# Patient Record
Sex: Female | Born: 1945 | Race: White | Hispanic: No | Marital: Married | Smoking: Never smoker
Health system: Southern US, Community
[De-identification: ages and names within clinical notes are randomized; demographics above are authoritative.]

## PROBLEM LIST (undated history)

## (undated) DIAGNOSIS — I480 Paroxysmal atrial fibrillation: Secondary | ICD-10-CM

## (undated) DIAGNOSIS — R319 Hematuria, unspecified: Secondary | ICD-10-CM

## (undated) DIAGNOSIS — I517 Cardiomegaly: Secondary | ICD-10-CM

## (undated) DIAGNOSIS — E039 Hypothyroidism, unspecified: Secondary | ICD-10-CM

## (undated) DIAGNOSIS — D696 Thrombocytopenia, unspecified: Secondary | ICD-10-CM

## (undated) DIAGNOSIS — Z973 Presence of spectacles and contact lenses: Secondary | ICD-10-CM

## (undated) DIAGNOSIS — N200 Calculus of kidney: Secondary | ICD-10-CM

## (undated) DIAGNOSIS — E559 Vitamin D deficiency, unspecified: Secondary | ICD-10-CM

## (undated) DIAGNOSIS — L900 Lichen sclerosus et atrophicus: Secondary | ICD-10-CM

## (undated) HISTORY — DX: Calculus of kidney: N20.0

## (undated) HISTORY — PX: CATARACT EXTRACTION: SUR2

## (undated) HISTORY — DX: Paroxysmal atrial fibrillation: I48.0

## (undated) HISTORY — DX: Vitamin D deficiency, unspecified: E55.9

## (undated) HISTORY — DX: Morbid (severe) obesity due to excess calories: E66.01

## (undated) HISTORY — PX: REPLACEMENT TOTAL KNEE: SUR1224

## (undated) HISTORY — PX: DILATION AND CURETTAGE OF UTERUS: SHX78

## (undated) HISTORY — DX: Hematuria, unspecified: R31.9

## (undated) HISTORY — DX: Cardiomegaly: I51.7

## (undated) HISTORY — DX: Thrombocytopenia, unspecified: D69.6

## (undated) HISTORY — DX: Hypothyroidism, unspecified: E03.9

## (undated) HISTORY — DX: Lichen sclerosus et atrophicus: L90.0

## (undated) HISTORY — PX: TONSILLECTOMY: SUR1361

---

## 1975-11-12 HISTORY — PX: TUBAL LIGATION: SHX77

## 1998-01-29 ENCOUNTER — Other Ambulatory Visit: Admission: RE | Admit: 1998-01-29 | Discharge: 1998-01-29 | Payer: Self-pay | Admitting: Obstetrics and Gynecology

## 1998-02-11 HISTORY — PX: HYSTEROSCOPY WITH D & C: SHX1775

## 1998-02-12 ENCOUNTER — Other Ambulatory Visit: Admission: RE | Admit: 1998-02-12 | Discharge: 1998-02-12 | Payer: Self-pay | Admitting: Obstetrics and Gynecology

## 1998-02-20 ENCOUNTER — Ambulatory Visit (HOSPITAL_COMMUNITY): Admission: RE | Admit: 1998-02-20 | Discharge: 1998-02-20 | Payer: Self-pay | Admitting: Obstetrics and Gynecology

## 1999-04-13 ENCOUNTER — Emergency Department (HOSPITAL_COMMUNITY): Admission: EM | Admit: 1999-04-13 | Discharge: 1999-04-13 | Payer: Self-pay

## 1999-12-06 ENCOUNTER — Other Ambulatory Visit: Admission: RE | Admit: 1999-12-06 | Discharge: 1999-12-06 | Payer: Self-pay | Admitting: Obstetrics and Gynecology

## 2000-03-11 ENCOUNTER — Other Ambulatory Visit: Admission: RE | Admit: 2000-03-11 | Discharge: 2000-03-11 | Payer: Self-pay | Admitting: Obstetrics and Gynecology

## 2000-06-15 ENCOUNTER — Other Ambulatory Visit: Admission: RE | Admit: 2000-06-15 | Discharge: 2000-06-15 | Payer: Self-pay | Admitting: Obstetrics and Gynecology

## 2000-10-07 ENCOUNTER — Ambulatory Visit (HOSPITAL_BASED_OUTPATIENT_CLINIC_OR_DEPARTMENT_OTHER): Admission: RE | Admit: 2000-10-07 | Discharge: 2000-10-07 | Payer: Self-pay | Admitting: Orthopedic Surgery

## 2000-12-14 ENCOUNTER — Other Ambulatory Visit: Admission: RE | Admit: 2000-12-14 | Discharge: 2000-12-14 | Payer: Self-pay | Admitting: Obstetrics and Gynecology

## 2001-03-16 ENCOUNTER — Other Ambulatory Visit: Admission: RE | Admit: 2001-03-16 | Discharge: 2001-03-16 | Payer: Self-pay | Admitting: Obstetrics and Gynecology

## 2001-09-10 ENCOUNTER — Other Ambulatory Visit: Admission: RE | Admit: 2001-09-10 | Discharge: 2001-09-10 | Payer: Self-pay | Admitting: Obstetrics and Gynecology

## 2001-12-31 ENCOUNTER — Other Ambulatory Visit: Admission: RE | Admit: 2001-12-31 | Discharge: 2001-12-31 | Payer: Self-pay | Admitting: Obstetrics and Gynecology

## 2003-04-10 ENCOUNTER — Other Ambulatory Visit: Admission: RE | Admit: 2003-04-10 | Discharge: 2003-04-10 | Payer: Self-pay | Admitting: Obstetrics and Gynecology

## 2003-04-14 HISTORY — PX: COLONOSCOPY: SHX174

## 2004-04-13 DIAGNOSIS — N200 Calculus of kidney: Secondary | ICD-10-CM

## 2004-04-13 HISTORY — DX: Calculus of kidney: N20.0

## 2004-04-19 ENCOUNTER — Emergency Department (HOSPITAL_COMMUNITY): Admission: EM | Admit: 2004-04-19 | Discharge: 2004-04-19 | Payer: Self-pay | Admitting: Emergency Medicine

## 2004-07-25 ENCOUNTER — Other Ambulatory Visit: Admission: RE | Admit: 2004-07-25 | Discharge: 2004-07-25 | Payer: Self-pay | Admitting: *Deleted

## 2004-11-14 ENCOUNTER — Ambulatory Visit (HOSPITAL_COMMUNITY): Admission: RE | Admit: 2004-11-14 | Discharge: 2004-11-14 | Payer: Self-pay | Admitting: Urology

## 2004-11-18 ENCOUNTER — Ambulatory Visit (HOSPITAL_COMMUNITY): Admission: RE | Admit: 2004-11-18 | Discharge: 2004-11-18 | Payer: Self-pay | Admitting: Urology

## 2005-12-16 ENCOUNTER — Other Ambulatory Visit: Admission: RE | Admit: 2005-12-16 | Discharge: 2005-12-16 | Payer: Self-pay | Admitting: Obstetrics & Gynecology

## 2006-12-28 ENCOUNTER — Inpatient Hospital Stay (HOSPITAL_COMMUNITY): Admission: RE | Admit: 2006-12-28 | Discharge: 2006-12-31 | Payer: Self-pay | Admitting: Orthopedic Surgery

## 2007-07-06 ENCOUNTER — Other Ambulatory Visit: Admission: RE | Admit: 2007-07-06 | Discharge: 2007-07-06 | Payer: Self-pay | Admitting: Obstetrics and Gynecology

## 2007-08-09 ENCOUNTER — Inpatient Hospital Stay (HOSPITAL_COMMUNITY): Admission: RE | Admit: 2007-08-09 | Discharge: 2007-08-12 | Payer: Self-pay | Admitting: Orthopedic Surgery

## 2007-08-09 DIAGNOSIS — Z96652 Presence of left artificial knee joint: Secondary | ICD-10-CM | POA: Insufficient documentation

## 2008-07-14 HISTORY — PX: BREAST EXCISIONAL BIOPSY: SUR124

## 2008-10-04 ENCOUNTER — Other Ambulatory Visit: Admission: RE | Admit: 2008-10-04 | Discharge: 2008-10-04 | Payer: Self-pay | Admitting: Obstetrics and Gynecology

## 2010-05-03 ENCOUNTER — Ambulatory Visit (HOSPITAL_BASED_OUTPATIENT_CLINIC_OR_DEPARTMENT_OTHER): Admission: RE | Admit: 2010-05-03 | Discharge: 2010-05-03 | Payer: Self-pay | Admitting: Orthopedic Surgery

## 2010-06-13 DIAGNOSIS — E039 Hypothyroidism, unspecified: Secondary | ICD-10-CM

## 2010-06-13 DIAGNOSIS — E559 Vitamin D deficiency, unspecified: Secondary | ICD-10-CM

## 2010-06-13 HISTORY — DX: Vitamin D deficiency, unspecified: E55.9

## 2010-06-13 HISTORY — DX: Hypothyroidism, unspecified: E03.9

## 2010-06-25 LAB — HM PAP SMEAR: HM Pap smear: NEGATIVE

## 2010-11-26 NOTE — Op Note (Signed)
NAME:  Sally Wise, Sally Wise NO.:  [id number]   MEDICAL RECORD NO.:  [id number]          PATIENT TYPE:  INP   LOCATION:  X003                         FACILITY:  [facility]   PHYSICIAN:  Ollen Gross, M.D.    DATE OF BIRTH:  May 08, 1946   DATE OF PROCEDURE:  12/28/2006  DATE OF DISCHARGE:                               OPERATIVE REPORT   PREOPERATIVE DIAGNOSIS:  Osteoarthritis bilateral knees.   POSTOPERATIVE DIAGNOSIS:  Osteoarthritis bilateral knees.   PROCEDURE:  1. Right total knee arthroplasty.  2. Cortisone injection left knee.   SURGEON:  Ollen Gross, M.D.   ASSISTANT:  Avel Peace PA-C   ANESTHESIA:  Spinal.   ESTIMATED BLOOD LOSS:  Minimal.   DRAINS:  None.   TOURNIQUET TIME:  43 minutes at 300 mmHg.   COMPLICATIONS:  None.   CONDITION:  Stable to recovery.   BRIEF CLINICAL NOTE:  Sally Wise is a 65 year old female with severe  end-stage arthritis both knees, right more symptomatic than left.  She  has failed nonoperative management including multiple series of  injections and presents now for total knee arthroplasty of the right and  a cortisone injection on the left.   PROCEDURE IN DETAIL:  After successful initiation of spinal anesthetic,  a tourniquet is placed on her right thigh and right lower extremity  prepped and draped in usual sterile fashion.  Extremities wrapped in  Esmarch, knee flexed, tourniquet inflated 350 mmHg.  Midline incision  was made with 10 blade through subcutaneous tissue which was very thick  layer down to the level of her extensor mechanism.  Fresh blade is used  to make a medial parapatellar arthrotomy.  Soft tissue of the proximal  medial tibia subperiosteally elevated to the joint line with the knife  into the semimembranosus bursa with a Cobb elevator.  Soft tissue  laterally is elevated attention being paid to avoid patellar tendon on  tibial tubercle.  Patella subluxed laterally, knee flexed 90 degrees,  ACL  and PCL removed.  Drill was used to create a starting hole in the  distal femur and the canal was thoroughly irrigated.  5 degrees right  valgus alignment guide is placed and referencing off the posterior  condyles, rotations marked and the block pinned to remove 10 mL of the  distal femur.  Distal femoral resection was made with an oscillating  saw.  Sizing blocks placed a size 4 is most appropriate.  Rotation is  marked off the epicondylar axis and the size 4 cutting block placed.  The anterior-posterior and chamfer cuts were made.   Tibia subluxed forward and the menisci are removed.  Extramedullary  tibial alignment guide is placed referencing proximally at the medial  aspect of the tibial tubercle and distally along the second metatarsal  axis and tibial crest.  The resection is made so as to take 10 mm of the  non deficient lateral side.  Tibial resection is made with an  oscillating saw.  I did to go an additional 2 mm to get to the base of  tibial defect medially.  Size 4 was  the most appropriate tibial  component and the proximal tibia prepared the modular drill and keel  punch for size 4.  Femoral preparation is completed with the  intercondylar cut.   Size 4 mobile bearing tibial trial and size 4 posterior stabilized  femoral trial with a 12.5 mm posterior stabilized rotating platform  insert trial are placed.  Full extension achieved with excellent varus  valgus balance throughout full range of motion.  Patella was everted,  thickness measured 21 mm.  Freehand resection is taken to 13 mm, 38  templates placed, lug holes were drilled, trial patella was placed and  tracks normally.  Osteophytes removed off the posterior femur with the  trial in place.  All trials are removed and the neck cut and the cut  bone surfaces are prepared with pulsatile lavage.  The cement was mixed  and once ready for implantation the size 4 mobile bearing tibial tray,  size 4 posterior stabilized  femur and 38 patella are cemented into  place.  The patella was held with a clamp.  Trial 12.5 inserts placed,  knee held in full extension and all extruded cement removed.  Once  cement fully hardened then the wounds copiously irrigated with saline  solution and the FloSeal injected onto the posterior capsule.  The  permanent 12.5 mm posterior stabilized rotating platform insert is  placed into the tibial tray.  FloSeal injected into the medial lateral  gutters and suprapatellar area.  The tourniquet is then released with  total time of 43 minutes.  Moist sponge is held in the knee for about a  minute.  The knee was inspected and was minimal bleeding.  Any bleeding  identified is stopped with electrocautery.  We then thoroughly irrigated  with saline solution and extensor mechanism closed with interrupted #1  PDS.  Flexion against gravity to 125 degrees which point the calf and  posterior thigh and touching.  Subcu is then closed interrupted 2-0  Vicryl, subcuticular running 4-0 Monocryl.  Incisions cleaned and dried  and Steri-Strips and bulky sterile dressing applied.  She is then placed  in the knee immobilizer, awakened and transported to recovery in stable  condition.      Ollen Gross, M.D.  Electronically Signed     FA/MEDQ  D:  12/28/2006  T:  12/28/2006  Job:  [id number]

## 2010-11-26 NOTE — H&P (Signed)
NAME:  Sally, Wise NO.:  [id number]   MEDICAL RECORD NO.:  [id number]          PATIENT TYPE:  INP   LOCATION:  NA                           [facility]ACILITY:  [facility]   PHYSICIAN:  Ollen Gross, M.D.    DATE OF BIRTH:  1946/03/07   DATE OF ADMISSION:  12/28/2006  DATE OF DISCHARGE:                              HISTORY & PHYSICAL   DATE OF OFFICE VISIT HISTORY AND PHYSICAL:  December 24, 2006   CHIEF COMPLAINT:  Right greater than left knee pain.   HISTORY OF PRESENT ILLNESS:  The patient is a 65 year old female who has  been seen by Dr. Lequita Halt.  She is known to have end-stage  tricompartmental arthritis in both knees; the right knee is more  symptomatic and problematic than the left and more advanced on  radiograph.  She has been treated conservatively in the past for both  knees.  She has undergone injections including Synvisc.  It is felt at  this point and she best be served by undergoing knee replacement.  Risks  and benefits have been discussed.  She elects proceed with surgery with  the right knee first.   ALLERGIES:  NO KNOWN DRUG ALLERGIES.   CURRENT MEDICATIONS:  Celebrex.   PAST MEDICAL HISTORY:  History of renal calculi.   PAST SURGICAL HISTORY:  1. Tubal ligation.  2. Renal stent placement.  3. Left knee arthroscopy.   SOCIAL HISTORY:  Married, retired Runner, broadcasting/film/video, nonsmoker, no alcohol.  Two  children.   FAMILY HISTORY:  Mother with history of hypertension.  Daughter with a  history of stroke.   REVIEW OF SYSTEMS:  GENERAL:  No fevers, chills or night sweats.  NEURO:  No seizures, syncope or paralysis.  RESPIRATORY:  No shortness of  breath, productive cough or hemoptysis.  CARDIOVASCULAR:  No chest pain,  angina or orthopnea.  GI:  No nausea, vomiting, diarrhea or  constipation.  GU:  No dysuria, hematuria or discharge.  MUSCULOSKELETAL:  Right knee.   PHYSICAL EXAMINATION:  VITAL SIGNS:  Pulse 64, respirations 12, blood  pressure 122/78.  GENERAL:  This is a 65 year old white female, well-nourished, well-  developed, overweight, obese, in no acute distress.  She is alert,  oriented, cooperative, pleasant, excellent historian.  HEENT:  Normocephalic, atraumatic.  Pupils are round and reactive.  Oropharynx clear.  EOMs intact.  NECK:  Supple.  CHEST:  Clear.  HEART:  Regular rate and rhythm.  No murmur.  ABDOMEN:  Soft, nontender, protuberant abdomen with large pannus.  RECTAL, BREAST AND GENITALIA:  Not done and not pertinent to present  illness.  EXTREMITIES:  Right knee:  The right knee shows no effusion, marked  crepitus noted and no instability.  Left knee:  No effusion, marked  crepitus is noted and no instability.   IMPRESSION:  1. Osteoarthritis, right greater than left knee.  2. Obesity.  3. History of renal calculi.   PLAN:  The patient will be admitted to [facility] and will  undergo a right total knee replacement arthroplasty.  Surgery will be  performed by Dr. Ollen Gross.  Alexzandrew L. Perkins, P.A.C.      Ollen Gross, M.D.  Electronically Signed    ALP/MEDQ  D:  12/27/2006  T:  12/28/2006  Job:  [id number]   cc:   C. Duane Lope, M.D.  Fax: [phone]

## 2010-11-26 NOTE — H&P (Signed)
NAME:  Sally Wise, Sally Wise NO.:  [id number]   MEDICAL RECORD NO.:  [id number]          PATIENT TYPE:  INP   LOCATION:  NA                           FACILITY:  [facility]   PHYSICIAN:  Ollen Gross, M.D.    DATE OF BIRTH:  May 29, 1946   DATE OF ADMISSION:  08/09/2007  DATE OF DISCHARGE:                              HISTORY & PHYSICAL   DATE OF OFFICE VISIT HISTORY AND PHYSICAL:  July 20, 2007.   CHIEF COMPLAINT:  Left knee pain.   HISTORY OF PRESENT ILLNESS:  The patient is a 65 year old female who has  been seen by Dr. Homero Fellers Aluisio for ongoing left knee pain.  She is well  known having previously undergone a right total knee back in June 2008.  She is doing well with the right knee, continues to have problems with  the left knee, has known end-stage arthritis and now presents for a  total knee arthroplasty.   ALLERGIES:  No known drug allergies.   CURRENT MEDICATIONS:  Celebrex.   PRIMARY CARE PHYSICIAN:  Dr. Tenny Craw.   CARDIOLOGIST:  Dr. Meade Maw in the past and she is seen at [facility].   PAST MEDICAL HISTORY:  History of renal calculi, history of atrial  fibrillation back in 2000 requiring an emergency room visit but no  hospitalization. Also a history of transfusion with a previous right  total knee in June 2008.   PAST SURGICAL HISTORY:  Tubal ligation, renal stent placement, left knee  arthroscopy, right total knee arthroplasty in June 2008.   SOCIAL HISTORY:  Married, retired Runner, broadcasting/film/video.  Nonsmoker.  No alcohol. The  family will be assisting with care after surgery.   FAMILY HISTORY:  Father deceased, mother with history of hypertension,  age 63, has one sibling age 55.   REVIEW OF SYSTEMS:  GENERAL:  No fevers, chills, or night sweats.  NEUROLOGIC:  No seizures, syncope or paralysis.  RESPIRATORY:  No  shortness of breath, productive cough or hemoptysis.  CARDIOVASCULAR:  No chest pain, angina or orthopnea. GI: No nausea, vomiting,  diarrhea,  or constipation.  GU: No dysuria, hematuria or discharge.  MUSCULOSKELETAL:  Left knee pain.   PHYSICAL EXAMINATION:  VITAL SIGNS:  Pulse 60, respirations 14, blood  pressure 134/88.  GENERAL:  A 65 year old, white female, well-nourished, well-developed,  in no acute distress, alert, oriented and cooperative, overweight,  obese, good historian.  HEENT:  Normocephalic, atraumatic.  Pupils round and reactive.  Oropharynx clear.  EOMs intact.  NECK:  Supple.  CHEST:  Clear anterior and posterior chest walls.  No rhonchi, rales or  wheezing.  HEART:  Regular rate and rhythm.  No murmur, S1, S2 noted.  ABDOMEN:  Soft, nontender, round protuberant abdomen.  RECTAL/BREASTS/GENITALIA:  Not done not pertinent to present illness.  EXTREMITIES:  Left knee motor function is intact.  She has moderate  crepitus noted on passive range of motion.  No effusion.   IMPRESSION:  Osteoarthritis left knee.   PLAN:  The patient will be admitted to [facility] to undergo a left  total knee replacement arthroplasty.  Surgery will be performed by Dr.  Ollen Gross. She has been seen preoperatively by Dr. Tenny Craw and cleared  for up and coming surgery.      Alexzandrew L. Perkins, P.A.C.      Ollen Gross, M.D.  Electronically Signed    ALP/MEDQ  D:  08/08/2007  T:  08/09/2007  Job:  [id number]   cc:   C. Duane Lope, M.D.  Fax: [phone]

## 2010-11-26 NOTE — Op Note (Signed)
NAME:  Sally Wise, Sally Wise NO.:  [id number]   MEDICAL RECORD NO.:  [id number]          PATIENT TYPE:  INP   LOCATION:  X003                         FACILITY:  [facility]   PHYSICIAN:  Ollen Gross, M.D.    DATE OF BIRTH:  11/01/1945   DATE OF PROCEDURE:  12/28/2006  DATE OF DISCHARGE:                               OPERATIVE REPORT   ADDENDUM:  At the completion of a total knee arthroplasty I then prepped  the left knee with Betadine and injected 9 mL of lidocaine with 1 mL  Depo-Medrol into the left knee.  We then dressed with a Band-Aid.  She  was then awakened and transferred to recovery in stable condition.   This is an addendum to her note, number [phone].      Ollen Gross, M.D.  Electronically Signed     FA/MEDQ  D:  12/28/2006  T:  12/28/2006  Job:  [id number]

## 2010-11-26 NOTE — Op Note (Signed)
NAME:  Sally, Wise NO.:  [id number]   MEDICAL RECORD NO.:  [id number]          PATIENT TYPE:  INP   LOCATION:  0005                         FACILITY:  [facility]   PHYSICIAN:  Ollen Gross, M.D.    DATE OF BIRTH:  02/08/1946   DATE OF PROCEDURE:  08/09/2007  DATE OF DISCHARGE:                               OPERATIVE REPORT   PREOPERATIVE DIAGNOSIS:  Osteoarthritis left knee.   POSTOPERATIVE DIAGNOSIS:  Osteoarthritis left knee.   PROCEDURE:  Left total knee arthroplasty.   SURGEON:  Ollen Gross, M.D.   ASSISTANT:  Avel Peace PA-C   ANESTHESIA:  General with postop Marcaine pain pump.   ESTIMATED BLOOD LOSS:  200 mL.   DRAIN:  None.   TOURNIQUET TIME:  26 minutes at 300 mmHg.   COMPLICATIONS:  None.   BRIEF CLINICAL NOTE:  [name] is a 65 year old female with end-stage  arthritis of the left knee with progressively worsening pain and  dysfunction.  She presents for total knee arthroplasty.   PROCEDURE IN DETAIL:  After successful administration of general  anesthetic a tourniquet was placed high on the left thigh and left lower  extremity prepped and draped in the usual sterile fashion.  Extremity  was wrapped in Esmarch, knee flexed, tourniquet inflated 300 mmHg.  Midline incision made with a 10 blade through subcutaneous tissue to the  level of the extensor mechanism.  Fresh blade is used to make a medial  parapatellar arthrotomy.  Soft tissue over the proximal medial tibia  subperiosteally elevated to the joint line with the knife and into the  semimembranosus bursa with a Cobb elevator.  Soft tissue laterally is  elevated with attention being paid to avoiding patellar tendon on tibial  tubercle.  The patella subluxed laterally, knee flexed 90 degrees, ACL  and PCL removed.  Drill was used to create a starting hole in the distal  femur and the canal was thoroughly irrigated.  The 5 degree left valgus  alignment guide is placed referencing  off the posterior condyles,  rotations marked and the block pinned to remove 11 mm of the distal  femur.  11 mm removed because of the preop flexion contracture.  Distal  femoral resection is made with an oscillating saw.  Due to the habitus  of her leg we were unable to size the femur at this point so I went to  the tibia.   Tibia subluxed forward and menisci removed.  The extramedullary tibial  alignment guide is placed referencing proximally at the medial aspect of  the tibial tubercle and distally along the second metatarsal axis and  tibial crest.  Blocks pinned to remove about 4 mm of the more deficient  medial side.  Tibial resection is made with an oscillating saw.  Still  had very large spur medially which is excised.  Size 4 is most  appropriate tibial component.   Went back to the femur.  At this point it is evident that the tourniquet  is a venous tourniquet and not working well, thus we released the  tourniquet and actually had left  bleeding when we released the  tourniquet.  The sizing block is now placed, size 4 is most appropriate.  Rotations marked off the epicondylar axis.  Size four cutting block is  placed and the anterior-posterior chamfer cuts made.  We then subluxed  tibia forward again and prepared the proximal tibia with the modular  drill and keel punch for the size 4.  The femoral preparation is then  completed the intercondylar cut for the size 4.   Size 4 mobile bearing tibial trial, size 4 posterior stabilized femoral  trial and 10 mm posterior stabilized rotating platform insert trial  placed.  With a 10 full extension was achieved with excellent varus and  valgus balance throughout full range of motion.  The patella was  everted, thickness measured 20 mm.  Freehand resection taken to 12 mm,  35 template is placed, lug holes were drilled, trial patella was placed,  it tracks normally.  Osteophytes removed off the posterior femur with  the trial in place.   All trials removed and the cut bone surfaces are  prepared with pulsatile lavage.  Cement was mixed and once ready for  implantation a size 4 mobile bearing tibial tray, size 4 posterior  stabilized femur and 35 patella are cemented into place.  Patella was  held with a clamp.  Trial 10-mm inserts placed, knee held in full  extension and all extruded cement removed.  When the cement was fully  hardened then the wound was copiously irrigated with saline solution and  the permanent 10 mm posterior stabilized rotating platform insert is  placed into the tibial tray.  The FloSeal was placed in mediolateral  gutters and suprapatellar area.  Moist sponges held 2 minutes then  removed.  This essentially stopped all the soft tissue bleeding.  We  irrigated again to remove the FloSeal and then the arthrotomy was closed  with interrupted #1 PDS.  Flexion against gravity to 150 and 120 degrees  at which point the calf and posterior thigh were touching.  Subcu was  closed in two layers with interrupted 2-0 Vicryl due to the thickness of  that layer.  Subcuticular is then closed with running 4-0 Monocryl.  The  catheter for Marcaine pain pump is placed, the pump initiated.  The  incision was cleaned and dried and Steri-Strips and a bulky sterile  dressing applied.  She is placed into a knee immobilizer, awakened and  transferred to recovery in stable condition.      Ollen Gross, M.D.  Electronically Signed     FA/MEDQ  D:  08/09/2007  T:  08/09/2007  Job:  [id number]

## 2010-11-29 NOTE — Op Note (Signed)
[facility]  Patient:    Sally Wise, Sally Wise                     MRN: [id number] Proc. Date: 10/07/00 Adm. Date:  [id number] Attending:  Georgena Spurling                           Operative Report  PREOPERATIVE DIAGNOSIS:  Left knee osteoarthritis and medial and lateral meniscus tears.  POSTOPERATIVE DIAGNOSIS:  Left knee osteoarthritis and medial and lateral meniscus tears.  OPERATION PERFORMED:  Left knee arthroscopy with debridement in all three compartments and partial medial and partial lateral meniscectomy.  SURGEON:  Georgena Spurling, M.D.  ANESTHESIA:  INDICATIONS FOR PROCEDURE:  The patient is a 65 year old white female with mechanical symptoms.  After informed consent was obtained, she was taken to the operating room.  DESCRIPTION OF PROCEDURE:  She was laid supine after being administered a knee block and ____________ in the preanesthesia holding area.  IV sedation was then used.  The left lower extremity was prepped and draped in the usual sterile fashion.  Inferolateral and inferomedial portals were created with a #11 blade, blunt trocar and cannula.  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  She had a large medial and lateral meniscus.  Her ACL was intact.  I used a straight basket forceps through the inferomedial portal to perform an aggressive partial medial meniscectomy and then the Automatic Data shaver through the same portal to remove the debris.  I then went to the figure 4 position where I used the straight basket forceps and [place] shaver to perform the partial lateral meniscectomy and chondroplasty.  We then returned to the medial compartment and performed further chondroplasty and then continued our chondroplasty up into the trochlea and onto the patella with the leg in extension.  I then lavaged the joint and did one further diagnostic scope to make sure that all bony debris was  removed and that the menisci had been debrided back to a stable rim.  Once this was done, I removed the fluid and instrumentation.  I closed each portal with a single interrupted 4-0 nylon stitch.  I infiltrated with 10 cc of 0.5% Marcaine morphine mixture and dressed with Xeroform, dressing sponges, sterile Webril and Ace wrap.  The patient tolerated the procedure well.  Tourniquet time none.  COMPLICATIONS:  None.  DRAINS:  None. DD:  10/07/00 TD:  10/07/00 Job: [id number]

## 2010-11-29 NOTE — Discharge Summary (Signed)
NAME:  Sally Wise, Sally Wise NO.:  [id number]   MEDICAL RECORD NO.:  [id number]          PATIENT TYPE:  INP   LOCATION:  1618                         FACILITY:  [facility]   PHYSICIAN:  Ollen Gross, M.D.    DATE OF BIRTH:  1945/09/18   DATE OF ADMISSION:  12/28/2006  DATE OF DISCHARGE:  12/31/2006                               DISCHARGE SUMMARY   ADMITTING DIAGNOSES:  1. Osteoarthritis right greater than left knee.  2. Obesity.  3. History of renal calculi.   DISCHARGE DIAGNOSES:  1. Osteoarthritis bilateral knees, status post right total knee      arthroplasty, with a cortisone injection in the left knee.  2. Mild postoperative blood loss anemia.  3. Status post transfusion, without sequelae.  4. Obesity.  5. History of renal calculi.   PROCEDURE:  On December 28, 2006, right total knee, with cortisone injection  into a left knee.  Surgeon:  Dr. Lequita Halt.  Assistant:  Patrica Duel, PA-C.  Done under spinal anesthesia.   CONSULTS:  None.   BRIEF HISTORY:  Sally Wise is a 65 year old female with severe end-  stage arthritis of both knees.  The right knee is more symptomatic than  the left.  Failed nonoperative management, including injections.  Now  presents for a total knee on the right, followed by cortisone injection  on the left knee.   LABORATORY DATA:  Preop CBC showed hemoglobin 13, hematocrit 38.6, white  cell count 4.3.  Postop hemoglobin 10.4, drifted down to 9.5, then to  8.6.  It was felt that she would receive blood.  This was on the last  day.  She was given 2 units of blood and discharged home.  Preop PT/PTT  on admission 12.4 and 25, respectively.  INR 0.9.  Serial pro times  followed.  Last PT/INR 20.2 and 1.7.  Chem panel:  Chem panel on  admission all within normal limits.  Serial BMETs were followed.  Electrolytes remained within normal limits.  Preop UA:  Moderate  hemoglobin, 0-2 white cells, 3-6 red cells, otherwise negative.  Blood  group type A positive.   EKG, December 22, 2006:  Sinus bradycardia.  No significant change since  last tracing, confirmed by Dr. Dietrich Pates.  Two-view chest, December 22, 2006:  No evidence of acute cardiopulmonary disease.   HOSPITAL COURSE:  The patient was admitted to [facility].  Tolerated seizure well.  Later transferred from the recovery room to the  orthopedic floor.  Did fairly well on the evening of surgery.  On the  morning of day 1, she did have some thigh soreness, which was felt to be  due probably to the tourniquet.  She started getting up with therapy.  She tolerated the procedure and the injection on the opposite knee.  Her  output was good.  Hemoglobin was stable at 10.4, asymptomatic.  By day  2, she was getting up a little bit more with PT, ambulating short  distances within the room, but that afternoon she actually walked 200  feet.  She was doing very well with  her therapy.  Hemoglobin was down a  little bit.  She was placed on iron.  It was felt that since she was  progressing well she would probably go home in the next day or so.  On  the morning of day three, she was doing well with therapy.  She was  lightheaded, and due to the hemoglobin dropping down to 8.6, it was felt  she would probably best be served by undergoing blood.  She was given 2  units of blood and tolerated that well.  She did well with her physical  therapy and was discharged home later that day.   DISCHARGE PLAN:  1. The patient was discharged home on December 31, 2006.  2. Discharge diagnoses:  Please see above.  3. Discharge medications:  Percocet, Robaxin, Coumadin, Nu-Iron,      Restoril.   DIET:  Resume home diet.  Diet as tolerated.   FOLLOWUP:  2 weeks.   ACTIVITY:  Weightbearing as tolerated right leg.  Home health PT and  home health nursing.   DISPOSITION:  Home.   CONDITION UPON DISCHARGE:  Improved.      Alexzandrew L. Perkins, P.A.C.      Ollen Gross, M.D.   Electronically Signed    ALP/MEDQ  D:  02/11/2007  T:  02/12/2007  Job:  [id number]

## 2010-11-29 NOTE — Op Note (Signed)
NAME:  Sally Wise, Sally Wise NO.:  [id number]   MEDICAL RECORD NO.:  [id number]          PATIENT TYPE:  AMB   LOCATION:  DAY                          FACILITY:  [facility]   PHYSICIAN:  Claudette Laws, M.D.  DATE OF BIRTH:  1946/06/15   DATE OF PROCEDURE:  11/14/2004  DATE OF DISCHARGE:                                 OPERATIVE REPORT   PREOPERATIVE DIAGNOSES:  1.  An 8 mm right ureteral stone with ureteral colic.  2.  Past history of nephrolithiasis.   POSTOPERATIVE DIAGNOSES:  1.  An 8 mm right ureteral stone with ureteral colic.  2.  Past history of nephrolithiasis.   OPERATION:  1.  Cystoscopy.  2.  Right retrograde pyeloureterogram and insertion of a 6-French 26 cm      double-J stent.   HISTORY:  This is a 65 year old lady who we saw in the office earlier this  week with right-sided colicky pain, hematuria.  A noncontrast CT scan showed  about an 8 mm proximal right ureteral stone with hydronephrosis.  On the KUB  x-ray, we could see the stone in the upper ureter.  We discussed treatment  options and a decision was made to put up a double-J stent today and follow  up with lithotripsy if the stone remains in the same location.  If the stone  migrates distally, we would then offer her ureteroscopy.  Since we last saw  her, she has been feeling better but has not passed the stone.   PROCEDURE:  The patient was prepped and draped in the dorsal lithotomy  position under intubated general anesthesia.  Cystoscopy was performed with  the 22-French rigid cystoscope.  The bladder itself was smooth, normal in  contour, normal ureteral orifices, no obvious tumors.  There was some slight  erythema around the right ureteral orifice.   Using a 6-French open-ended ureteral catheter and a 0.038 Bentson guidewire,  I intubated the right ureter and then under fluoroscopic control we passed  up the open-ended catheter to a few cm.   We then performed a right retrograde  pyeloureterogram.  We had outlined the  entire ureter and kidney and there was no obvious hydronephrosis today.  I  could not appreciate the stone in the intrarenal collecting system.  We did  perform x-rays all the way down to the distal ureter.   At this point, I then passed up a 0.038 wire, and then using fluoroscopic  control, a 6-French 26 cm double-J stent was curled up in the renal pelvis.  The distal end was curled up in the bladder.  The bladder was emptied.  All  instruments were removed and a B&O suppository was placed for anesthetic  purposes.   She was then taken back to the PACU in satisfactory condition.   Incidentally, a preop KUB x-ray did not show any obvious stone today.  However, the patient weighs over 300 pounds and overlying gas may be  obscuring the stone.   The plan now is to repeat the KUB x-ray postop, possibly repeat a CT scan if  we are unable to identify  the stone.      RFS/MEDQ  D:  11/14/2004  T:  11/14/2004  Job:  [id number]

## 2010-11-29 NOTE — Discharge Summary (Signed)
NAME:  Sally Wise, STOREY NO.:  [id number]   MEDICAL RECORD NO.:  [id number]          PATIENT TYPE:  INP   LOCATION:  1603                         FACILITY:  [facility]   PHYSICIAN:  Ollen Gross, M.D.    DATE OF BIRTH:  02-22-1946   DATE OF ADMISSION:  08/09/2007  DATE OF DISCHARGE:  08/12/2007                               DISCHARGE SUMMARY   ADMISSION DIAGNOSES:  1. Osteoarthritis left knee.  2. History of renal calculi.  3. History of atrial fibrillation.  4. Past history of transfusion with previous right total knee.   DISCHARGE DIAGNOSES:  1. Osteoarthritis left knee status post left total knee replacement      arthroplasty.  2. Postop blood loss anemia, did not require transfusion this time.  3. History of renal calculi.  4. History of atrial fibrillation.  5. Past history of transfusion with previous right total knee.   PROCEDURE:  August 09, 2007, left total knee.  Surgeon Dr. Lequita Halt,  assistant Avel Peace, PA-C.  Anesthesia general.   CONSULTS:  None.   BRIEF HISTORY:  [name] is a 65 year old female with end-stage  arthritis of the left knee and progressive worsening pain and  dysfunction, now presents for total knee arthroplasty.   LABORATORY DATA:  Preop CBC showed hemoglobin 13, hematocrit 37.6, white  cell count 4.6, platelets 137.  Postop hemoglobin 9.8, drifted down to  8.7, last noted H&H 8.5 and 24.2.  PT/PTT preop 12.6 and 26,  respectively.  INR 0.9.  Serial protimes followed.  Last noted PT/INR  22.2 and 1.9.  Chem panel on admission all within normal limits.  Serial  BMETs were followed.  Electrolytes remained within normal limits.  Preop  UA showed moderate hemoglobin, few epithelials, 0-2 white cells, 0-2 red  cells, few bacteria.  Blood group and type A+.   EKG on December 22, 2006, sinus bradycardia, no significant change since  last tracing performed by Dr. Dietrich Pates.   HOSPITAL COURSE:  The patient was admitted to [facility], p.o.  medications, had some decent urinary output, a little low.  Pressure was  okay.  Started on iron since her hemoglobin was a little bit low at 9.8.  she was asymptomatic with this.  She actually did extremely well with  physical therapy.  On day #1, walked about 100 feet.  By day #2, she was  doing better.  Hemoglobin was a little low at 8.7, but she was  asymptomatic with this.  Dressing changed and incision looked good.  Continued to progress well with physical therapy walking up to 100 feet  and then 150 feet.  By day #3, she was going over 200 feet.  She was  meeting all of her goals.  Hemoglobin __________  1. Discharge diagnoses:  Please see above.  2. Discharge medications:  Iron, Restoril, Coumadin, Percocet,      Robaxin.  3. Diet as tolerated.  4. Activity:  Weightbearing as tolerated total knee protocol.  5. Follow-up in two weeks.   DISPOSITION:  Home.   CONDITION ON DISCHARGE:  Improving.  Alexzandrew L. Perkins, P.A.C.      Ollen Gross, M.D.  Electronically Signed    ALP/MEDQ  D:  09/21/2007  T:  09/22/2007  Job:  [id number]   cc:   C. Duane Lope, M.D.  Fax: [phone]

## 2011-04-03 LAB — COMPREHENSIVE METABOLIC PANEL
ALT: 15
AST: 15
Alkaline Phosphatase: 76
CO2: 27
Calcium: 9
Chloride: 106
GFR calc non Af Amer: >60
Glucose, Bld: 91
Sodium: 139
Total Bilirubin: 0.9

## 2011-04-03 LAB — PROTIME-INR
INR: 1.6 — ABNORMAL HIGH
Prothrombin Time: 12.6

## 2011-04-03 LAB — CBC
HCT: 24.2 — ABNORMAL LOW
HCT: 28.1 — ABNORMAL LOW
Hemoglobin: 13
Hemoglobin: 8.7 — ABNORMAL LOW
MCHC: 34.5
MCHC: 35
MCHC: 35.2
MCV: 86.1
Platelets: 109 — ABNORMAL LOW
Platelets: 97 — ABNORMAL LOW
RBC: 4.35
RDW: 14.2
RDW: 14.4
RDW: 14.4
WBC: 4.6

## 2011-04-03 LAB — BASIC METABOLIC PANEL
BUN: 14
CO2: 26
CO2: 27
Calcium: 8.1 — ABNORMAL LOW
Chloride: 108
Creatinine, Ser: 0.99
Glucose, Bld: 111 — ABNORMAL HIGH
Glucose, Bld: 130 — ABNORMAL HIGH
Potassium: 4.1
Sodium: 139

## 2011-04-03 LAB — URINALYSIS, ROUTINE W REFLEX MICROSCOPIC
Leukocytes, UA: NEGATIVE
Nitrite: NEGATIVE
Specific Gravity, Urine: 1.023
pH: 5.5

## 2011-04-03 LAB — URINE MICROSCOPIC-ADD ON

## 2011-04-30 LAB — CBC
HCT: 27.7 — ABNORMAL LOW
HCT: 30.2 — ABNORMAL LOW
Hemoglobin: 10.4 — ABNORMAL LOW
Hemoglobin: 8.6 — ABNORMAL LOW
MCHC: 34.4
MCHC: 34.5
MCHC: 34.6
MCV: 85.3
MCV: 85.8
Platelets: 102 — ABNORMAL LOW
RBC: 3.24 — ABNORMAL LOW
RDW: 14.3 — ABNORMAL HIGH
RDW: 14.3 — ABNORMAL HIGH

## 2011-04-30 LAB — PROTIME-INR
INR: 1.7 — ABNORMAL HIGH
Prothrombin Time: 17.7 — ABNORMAL HIGH

## 2011-04-30 LAB — BASIC METABOLIC PANEL
BUN: 12
CO2: 26
CO2: 29
Chloride: 105
Chloride: 106
Creatinine, Ser: 0.81
Glucose, Bld: 141 — ABNORMAL HIGH
Potassium: 4.1
Potassium: 4.3
Sodium: 137

## 2011-04-30 LAB — TYPE AND SCREEN: ABO/RH(D): A POS

## 2011-05-01 LAB — URINALYSIS, ROUTINE W REFLEX MICROSCOPIC
Bilirubin Urine: NEGATIVE
Glucose, UA: NEGATIVE
Ketones, ur: NEGATIVE
Leukocytes, UA: NEGATIVE
Nitrite: NEGATIVE
Protein, ur: NEGATIVE

## 2011-05-01 LAB — COMPREHENSIVE METABOLIC PANEL
ALT: 12
BUN: 20
CO2: 28
Calcium: 9.7
Creatinine, Ser: 0.84
GFR calc non Af Amer: >60
Glucose, Bld: 93
Sodium: 144
Total Protein: 6.7

## 2011-05-01 LAB — CBC
Hemoglobin: 13
MCHC: 33.7
MCV: 85.3
RBC: 4.52
RDW: 14.1 — ABNORMAL HIGH

## 2011-05-01 LAB — URINE MICROSCOPIC-ADD ON

## 2011-05-01 LAB — PROTIME-INR
INR: 0.9
Prothrombin Time: 12.4

## 2011-05-01 LAB — APTT: aPTT: 25

## 2011-08-15 DIAGNOSIS — L900 Lichen sclerosus et atrophicus: Secondary | ICD-10-CM

## 2011-08-15 HISTORY — DX: Lichen sclerosus et atrophicus: L90.0

## 2012-10-27 ENCOUNTER — Encounter: Payer: Self-pay | Admitting: *Deleted

## 2012-10-28 ENCOUNTER — Encounter: Payer: Self-pay | Admitting: Obstetrics & Gynecology

## 2012-10-28 ENCOUNTER — Ambulatory Visit (INDEPENDENT_AMBULATORY_CARE_PROVIDER_SITE_OTHER): Payer: MEDICARE | Admitting: Obstetrics & Gynecology

## 2012-10-28 VITALS — BP 130/84 | HR 88 | Resp 18 | Ht 68.0 in | Wt 326.0 lb

## 2012-10-28 DIAGNOSIS — Z0000 Encounter for general adult medical examination without abnormal findings: Secondary | ICD-10-CM

## 2012-10-28 DIAGNOSIS — Z01419 Encounter for gynecological examination (general) (routine) without abnormal findings: Secondary | ICD-10-CM

## 2012-10-28 DIAGNOSIS — Z124 Encounter for screening for malignant neoplasm of cervix: Secondary | ICD-10-CM

## 2012-10-28 LAB — LIPID PANEL
LDL Cholesterol: 123 mg/dL — ABNORMAL HIGH (ref 0–99)
Triglycerides: 75 mg/dL (ref <150)
VLDL: 15 mg/dL (ref 0–40)

## 2012-10-28 LAB — TSH: TSH: 2.544 u[IU]/mL (ref 0.350–4.500)

## 2012-10-28 LAB — POCT URINALYSIS DIPSTICK: Leukocytes, UA: NEGATIVE

## 2012-10-28 LAB — HEMOGLOBIN, FINGERSTICK: Hemoglobin, fingerstick: 13.3 g/dL (ref 12.0–16.0)

## 2012-10-28 MED ORDER — CLOBETASOL PROPIONATE 0.05 % EX OINT: TOPICAL_OINTMENT | Freq: Two times a day (BID) | CUTANEOUS | Status: DC

## 2012-10-28 NOTE — Progress Notes (Signed)
67 y.o. G2P2 MarriedCaucasianF here for annual exam.  No vaginal bleeding.  When asked about using the clobetasol after I saw her last year, she says she used it for one month and stopped.  Denies symptoms.  Although, when specifically asked she reports using the shower head and spraying water directly on the vulva "feels so good" because of irritation.  No LMP recorded. Patient is postmenopausal.          Sexually active: yes  The current method of family planning is tubal ligation.    Exercising: yes  STATIONARY stationary bike, occasionally Smoker: no  Health Maintenance: Pap:  06/25/10 MMG: 06/21/12 Colonoscopy: 04/2003 BMD:  01/08/11 TDaP:07/06/07 Labs: Hgb 13.3, urine   reports that she has never smoked. She has never used smokeless tobacco. She reports that she does not drink alcohol or use illicit drugs.  Past Medical History  Diagnosis Date  . Hematuria   . Hypothyroidism 06/2010  . Kidney stone 04/2004  . Vitamin D deficiency 06/2010  . Atrial fibrillation 03/1999     Past Surgical History  Procedure Laterality Date  . Endomerial biopsy    . Endometrial biopsy  02/1995  02/1998    benign polyp  . Hysteroscopy  02/1998    frag. polyp  . Hysteroscopy    . Dilation and curettage of uterus      02/1998  . 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 Colonoscopy  04/2003    Current Outpatient Prescriptions  Medication Sig Dispense Refill  . levothyroxine (SYNTHROID) 75 MCG tablet Take 75 mcg by mouth daily before breakfast.      . Vitamin D, Ergocalciferol, (DRISDOL) 50000 UNITS CAPS Take 50,000 Units by mouth every 7 (seven) days.       No current facility-administered medications for this visit.    Family History  Problem Relation Age of Onset  . Hypertension Mother   . Kidney disease Mother   . Alzheimer's disease Father   . Diabetes Brother   . Hypertension Brother     ROS:  Pertinent items are noted in HPI.  Otherwise, a comprehensive ROS was  negative.  Exam:   BP 130/84  Pulse 88  Resp 18  Ht 5\' 8"  (1.727 m)  Wt 326 lb (147.873 kg)  BMI 49.58 kg/m2  Weight change: @WEIGHTCHANGE @ Height:   Height: 5\' 8"  (172.7 cm)  Ht Readings from Last 3 Encounters:  10/28/12 5\' 8"  (1.727 m)    General appearance: alert, cooperative and appears stated age, morbidly obese Head: Normocephalic, without obvious abnormality, atraumatic Neck: no adenopathy, supple, symmetrical, trachea midline and thyroid normal to inspection and palpation Lungs: clear to auscultation bilaterally Breasts: normal appearance, no masses or tenderness Heart: regular rate and rhythm Abdomen: soft, non-tender; bowel sounds normal; no masses,  no organomegaly Extremities: extremities normal, atraumatic, no cyanosis or edema Skin: Skin color, texture, turgor normal. No rashes or lesions Lymph nodes: Cervical, supraclavicular, and axillary nodes normal. No abnormal inguinal nodes palpated Neurologic: Grossly normal   Pelvic: External genitalia:  no lesions              Urethra:  normal appearing urethra with no masses, tenderness or lesions              Bartholins and Skenes: normal                 Vagina: normal appearing vagina with normal color and discharge, no lesions  Cervix: no lesions              Pap taken: yes Bimanual Exam:  Uterus:  normal size, contour, position, consistency, mobility, non-tender and prolapsed second degree              Adnexa: no mass, fullness, tenderness               Rectovaginal: Confirms               Anus:  normal sphincter tone, no lesions  A:  Well Woman with normal exam PMP, No HRT Morbid obesity Lichen sclerosus, biopsy proven hypothyroidism  P:   Mammogram yearly pap smear only today Restart clobestasol 0.05% ointment bid for next month.  Follow up for recheck in 4-6 weeks. TSH, Vit D, CMP, FLP  return annually or prn  An After Visit Summary was printed and given to the patient.

## 2012-10-28 NOTE — Patient Instructions (Addendum)

## 2012-10-29 LAB — COMPLETE METABOLIC PANEL WITH GFR
ALT: 8 U/L (ref 0–35)
AST: 12 U/L (ref 0–37)
Albumin: 4.7 g/dL (ref 3.5–5.2)
Alkaline Phosphatase: 86 U/L (ref 39–117)
Calcium: 9.7 mg/dL (ref 8.4–10.5)
Chloride: 104 mEq/L (ref 96–112)
Potassium: 4.5 mEq/L (ref 3.5–5.3)
Sodium: 139 mEq/L (ref 135–145)
Total Protein: 7.2 g/dL (ref 6.0–8.3)

## 2012-11-25 ENCOUNTER — Encounter: Payer: Self-pay | Admitting: *Deleted

## 2012-11-26 ENCOUNTER — Ambulatory Visit (INDEPENDENT_AMBULATORY_CARE_PROVIDER_SITE_OTHER): Payer: MEDICARE | Admitting: Obstetrics & Gynecology

## 2012-11-26 ENCOUNTER — Encounter: Payer: Self-pay | Admitting: Obstetrics & Gynecology

## 2012-11-26 VITALS — BP 140/84 | HR 68 | Ht 68.0 in | Wt 329.0 lb

## 2012-11-26 DIAGNOSIS — L851 Acquired keratosis [keratoderma] palmaris et plantaris: Secondary | ICD-10-CM

## 2012-11-26 NOTE — Progress Notes (Signed)
67 y.o.MarriedCaucasian female G2P2 for recheck of vulva.  Had biopsy showing lichen sclerosus 08/28/11.  Using bid clobetasol.  No vaginal bleeding or discharge.  Irritation of vulva is much better.  Has questions about her Vit D level, GFR, and thyroid.  All questions answered.    Exam:  Gen:  WNWD, Obese WF, NAD     GYN:     [id number] decreased thicken white areas, now just lateral to each size of clitoris                Vag:no lesions                Cx:  normal appearance                Uterus:prolapsed to vaginal opening  Dx:  Lichen sclerosus   JY:NWGNF clobetasol to qhs for one month.  Then decrease to twice weekly for another month.  Then use prn flares.  No rx needed. Seeing Dr. Tenny Craw for GFR follow-up.  Will have repeat labs.  ~15 minutes spent with patient >50% of time was in face to face discussion of above.

## 2012-11-26 NOTE — Patient Instructions (Addendum)
Decreased the clobetasol to daily for one more month.  Use it just where instructed.  Decrease twice weekly.  If you have any flare, increase to twice daily for seven days.

## 2013-01-10 ENCOUNTER — Other Ambulatory Visit: Payer: Self-pay | Admitting: Obstetrics & Gynecology

## 2013-01-10 MED ORDER — LEVOTHYROXINE SODIUM 75 MCG PO TABS: 75.0000 ug | ORAL_TABLET | Freq: Every day | ORAL | Status: DC

## 2013-01-10 NOTE — Telephone Encounter (Signed)
Pt was last seen 10/28/12 for AEX no rx was given. TSH was checked level was at 2.544  Please advise.  (chart in your door)

## 2013-01-10 NOTE — Telephone Encounter (Signed)
Need refill on thyroid medicine. Levothyroxine .75mg     [facility] on [address]

## 2013-01-15 ENCOUNTER — Encounter (HOSPITAL_COMMUNITY): Payer: Self-pay | Admitting: *Deleted

## 2013-01-15 ENCOUNTER — Other Ambulatory Visit: Payer: Self-pay

## 2013-01-15 ENCOUNTER — Emergency Department (HOSPITAL_COMMUNITY)
Admission: EM | Admit: 2013-01-15 | Discharge: 2013-01-15 | Disposition: A | Discharge status: Home / Self Care | Payer: Medicare Other | Attending: Emergency Medicine | Admitting: Emergency Medicine

## 2013-01-15 ENCOUNTER — Ambulatory Visit (INDEPENDENT_AMBULATORY_CARE_PROVIDER_SITE_OTHER): Payer: MEDICARE | Admitting: Family Medicine

## 2013-01-15 VITALS — BP 144/84 | HR 150 | Temp 97.8°F | Resp 18 | Ht 68.5 in | Wt 349.0 lb

## 2013-01-15 DIAGNOSIS — Z862 Personal history of diseases of the blood and blood-forming organs and certain disorders involving the immune mechanism: Secondary | ICD-10-CM | POA: Insufficient documentation

## 2013-01-15 DIAGNOSIS — R002 Palpitations: Secondary | ICD-10-CM

## 2013-01-15 DIAGNOSIS — I4891 Unspecified atrial fibrillation: Secondary | ICD-10-CM

## 2013-01-15 DIAGNOSIS — E039 Hypothyroidism, unspecified: Secondary | ICD-10-CM | POA: Insufficient documentation

## 2013-01-15 DIAGNOSIS — R000 Tachycardia, unspecified: Secondary | ICD-10-CM | POA: Insufficient documentation

## 2013-01-15 DIAGNOSIS — Z8639 Personal history of other endocrine, nutritional and metabolic disease: Secondary | ICD-10-CM | POA: Insufficient documentation

## 2013-01-15 DIAGNOSIS — R350 Frequency of micturition: Secondary | ICD-10-CM | POA: Insufficient documentation

## 2013-01-15 DIAGNOSIS — Z872 Personal history of diseases of the skin and subcutaneous tissue: Secondary | ICD-10-CM | POA: Insufficient documentation

## 2013-01-15 DIAGNOSIS — Z87448 Personal history of other diseases of urinary system: Secondary | ICD-10-CM | POA: Insufficient documentation

## 2013-01-15 DIAGNOSIS — Z79899 Other long term (current) drug therapy: Secondary | ICD-10-CM | POA: Insufficient documentation

## 2013-01-15 DIAGNOSIS — E860 Dehydration: Secondary | ICD-10-CM

## 2013-01-15 DIAGNOSIS — Z87442 Personal history of urinary calculi: Secondary | ICD-10-CM | POA: Insufficient documentation

## 2013-01-15 LAB — COMPREHENSIVE METABOLIC PANEL
Albumin: 3.8 g/dL (ref 3.5–5.2)
Alkaline Phosphatase: 73 U/L (ref 39–117)
BUN: 17 mg/dL (ref 6–23)
Chloride: 105 mEq/L (ref 96–112)
Creatinine, Ser: 0.92 mg/dL (ref 0.50–1.10)
GFR calc Af Amer: 72 mL/min — ABNORMAL LOW (ref >90)
Glucose, Bld: 88 mg/dL (ref 70–99)
Potassium: 4.6 mEq/L (ref 3.5–5.1)
Total Bilirubin: 0.5 mg/dL (ref 0.3–1.2)

## 2013-01-15 LAB — CBC WITH DIFFERENTIAL/PLATELET
Basophils Relative: 0 % (ref 0–1)
Eosinophils Absolute: 0 10*3/uL (ref 0.0–0.7)
HCT: 38.7 % (ref 36.0–46.0)
Hemoglobin: 12.7 g/dL (ref 12.0–15.0)
Lymphs Abs: 1.9 10*3/uL (ref 0.7–4.0)
MCH: 28.5 pg (ref 26.0–34.0)
MCHC: 32.8 g/dL (ref 30.0–36.0)
Monocytes Absolute: 1.3 10*3/uL — ABNORMAL HIGH (ref 0.1–1.0)
Monocytes Relative: 16 % — ABNORMAL HIGH (ref 3–12)
Neutro Abs: 4.8 10*3/uL (ref 1.7–7.7)
Neutrophils Relative %: 59 % (ref 43–77)
RBC: 4.45 MIL/uL (ref 3.87–5.11)

## 2013-01-15 LAB — MAGNESIUM: Magnesium: 2 mg/dL (ref 1.5–2.5)

## 2013-01-15 LAB — URINALYSIS, ROUTINE W REFLEX MICROSCOPIC
Bilirubin Urine: NEGATIVE
Glucose, UA: NEGATIVE mg/dL
Ketones, ur: NEGATIVE mg/dL
Nitrite: NEGATIVE
Specific Gravity, Urine: 1.01 (ref 1.005–1.030)
pH: 6 (ref 5.0–8.0)

## 2013-01-15 MED ORDER — DILTIAZEM LOAD VIA INFUSION
10.0000 mg | Freq: Once | INTRAVENOUS | Status: AC
  Administered 2013-01-15: 10 mg via INTRAVENOUS
  Filled 2013-01-15: qty 10

## 2013-01-15 MED ORDER — DILTIAZEM HCL ER COATED BEADS 120 MG PO TB24: 120.0000 mg | ORAL_TABLET | Freq: Every day | ORAL | Status: DC

## 2013-01-15 MED ORDER — DILTIAZEM HCL 100 MG IV SOLR
5.0000 mg/h | INTRAVENOUS | Status: DC
  Administered 2013-01-15: 10 mg/h via INTRAVENOUS

## 2013-01-15 MED ORDER — DILTIAZEM HCL ER COATED BEADS 120 MG PO CP24
120.0000 mg | ORAL_CAPSULE | Freq: Once | ORAL | Status: AC
  Administered 2013-01-15: 120 mg via ORAL
  Filled 2013-01-15: qty 1

## 2013-01-15 MED ORDER — CEPHALEXIN 500 MG PO CAPS: 500.0000 mg | ORAL_CAPSULE | Freq: Four times a day (QID) | ORAL | Status: AC

## 2013-01-15 NOTE — ED Provider Notes (Signed)
History    CSN: [id number] Arrival date & time 01/15/13  1411  None    Chief Complaint  Patient presents with  . Atrial Fibrillation   HPI the patient is a 67 year old female with past medical history of hypothyroidism, and atrial fibrillation who presents emergency department as a recommendation urgent care clinic for atrial fibrillation. At approximately 11 PM I signed patient reported having "a funny feeling in my chest". This feeling persisted in the morning and prompted her to go to the urgent care clinic. Patient reports having an episode of atrial fibrillation in 2000 which she had an extensive workup for, which she reports that it resolved. She can't describe if this sensation persists at this time. She does report that it is less intense than her last known episode of atrial fibrillation. The sensation does not radiate, there is no known exacerbating or relieving factors. She denies specifically shortness of breath, chest pain, nausea, vomiting, diaphoresis, abdominal pain. She denies being on any rate her rhythm control agents for atrial fibrillation. She reports being anticoagulated for approximately one week with the last episode. She denies any recent changes in her Synthroid dose. She denies alcohol use. She does report burning with urination and urinary frequency. This has been occurring for 3-4 days and worsening. Denies hematuria. Denies frequent history of UTIs. No recent treatment for UTI. Past Medical History  Diagnosis Date  . Hematuria     evaluation with Dr. Etta Grandchild  . Hypothyroidism 06/2010  . Kidney stone 04/2004  . Vitamin D deficiency 06/2010  . Atrial fibrillation 03/1999   . Lichen sclerosus et atrophicus 2/13    biopsy proven   Past Surgical History  Procedure Laterality Date  . Tubal ligation  5/77  . Hysteroscopy w/d&c  8/99  . Colonoscopy  04/2003       . Replacement total knee Bilateral 12/2006  07/2007    Family History  Problem Relation Age of Onset  .  Hypertension Mother   . Kidney disease Mother   . Alzheimer's disease Father   . Diabetes Brother   . Hypertension Brother    History  Substance Use Topics  . Smoking status: Never Smoker   . Smokeless tobacco: Never Used  . Alcohol Use: No   OB History   Grav Para Term Preterm Abortions TAB SAB Ect Mult Living   2 2        2      Review of Systems  Constitutional: Negative for fever, chills, diaphoresis, activity change and appetite change.  HENT: Negative for sore throat, rhinorrhea, sneezing, drooling and trouble swallowing.   Eyes: Negative for discharge and redness.  Respiratory: Negative for cough, chest tightness, shortness of breath, wheezing and stridor.   Cardiovascular: Negative for chest pain and leg swelling.  Gastrointestinal: Negative for nausea, vomiting, abdominal pain, diarrhea, constipation and blood in stool.  Genitourinary: Negative for difficulty urinating.  Musculoskeletal: Negative for myalgias and arthralgias.  Skin: Negative for pallor.  Neurological: Negative for dizziness, syncope, speech difficulty, weakness, light-headedness and headaches.  Hematological: Negative for adenopathy. Does not bruise/bleed easily.  Psychiatric/Behavioral: Negative for confusion and agitation.    Allergies  Review of patient's allergies indicates no known allergies.  Home Medications   Current Outpatient Rx  Name  Route  Sig  Dispense  Refill  . ibuprofen (ADVIL,MOTRIN) 200 MG tablet   Oral   Take 200-400 mg by mouth every 6 (six) hours as needed for pain.         Marland Kitchen  levothyroxine (SYNTHROID) 75 MCG tablet   Oral   Take 1 tablet (75 mcg total) by mouth daily before breakfast.   30 tablet   11    BP 121/78  Pulse 78  Temp(Src) 98.2 F (36.8 C) (Oral)  Resp 19  SpO2 98% Physical Exam  Constitutional: She is oriented to person, place, and time. She appears well-developed and well-nourished. No distress.  HENT:  Head: Normocephalic and atraumatic.  Right  Ear: External ear normal.  Left Ear: External ear normal.  Eyes: Conjunctivae and EOM are normal. Right eye exhibits no discharge. Left eye exhibits no discharge.  Neck: Normal range of motion. Neck supple. No JVD present.  Cardiovascular: An irregularly irregular rhythm present. Tachycardia present.  Exam reveals no gallop and no friction rub.   No murmur heard. intermittently tachycardic  Pulmonary/Chest: Effort normal and breath sounds normal. No stridor. No respiratory distress. She has no wheezes. She has no rales. She exhibits no tenderness.  Abdominal: Soft. Bowel sounds are normal. She exhibits no distension. There is no tenderness. There is no rebound and no guarding.  Musculoskeletal: Normal range of motion. She exhibits no edema.  Neurological: She is alert and oriented to person, place, and time.  Skin: Skin is warm. No rash noted. She is not diaphoretic.  Psychiatric: She has a normal mood and affect. Her behavior is normal.    ED Course  Procedures (including critical care time) Labs Reviewed  URINALYSIS, ROUTINE W REFLEX MICROSCOPIC - Abnormal; Notable for the following:    Hgb urine dipstick MODERATE (*)    Leukocytes, UA SMALL (*)    All other components within normal limits  CBC WITH DIFFERENTIAL - Abnormal; Notable for the following:    Platelets 148 (*)    Monocytes Relative 16 (*)    Monocytes Absolute 1.3 (*)    All other components within normal limits  COMPREHENSIVE METABOLIC PANEL - Abnormal; Notable for the following:    GFR calc non Af Amer 62 (*)    GFR calc Af Amer 72 (*)    All other components within normal limits  URINE MICROSCOPIC-ADD ON - Abnormal; Notable for the following:    Bacteria, UA FEW (*)    All other components within normal limits  MAGNESIUM  TSH  POCT I-STAT TROPONIN I   No results found. No diagnosis found. Results for orders placed during the hospital encounter of 01/15/13  URINALYSIS, ROUTINE W REFLEX MICROSCOPIC      Result  Value Range   Color, Urine YELLOW  YELLOW   APPearance CLEAR  CLEAR   Specific Gravity, Urine 1.010  1.005 - 1.030   pH 6.0  5.0 - 8.0   Glucose, UA NEGATIVE  NEGATIVE mg/dL   Hgb urine dipstick MODERATE (*) NEGATIVE   Bilirubin Urine NEGATIVE  NEGATIVE   Ketones, ur NEGATIVE  NEGATIVE mg/dL   Protein, ur NEGATIVE  NEGATIVE mg/dL   Urobilinogen, UA 0.2  0.0 - 1.0 mg/dL   Nitrite NEGATIVE  NEGATIVE   Leukocytes, UA SMALL (*) NEGATIVE  CBC WITH DIFFERENTIAL      Result Value Range   WBC 8.0  4.0 - 10.5 K/uL   RBC 4.45  3.87 - 5.11 MIL/uL   Hemoglobin 12.7  12.0 - 15.0 g/dL   HCT 45.4  09.8 - 11.9 %   MCV 87.0  78.0 - 100.0 fL   MCH 28.5  26.0 - 34.0 pg   MCHC 32.8  30.0 - 36.0 g/dL   RDW  15.2  11.5 - 15.5 %   Platelets 148 (*) 150 - 400 K/uL   Neutrophils Relative % 59  43 - 77 %   Neutro Abs 4.8  1.7 - 7.7 K/uL   Lymphocytes Relative 24  12 - 46 %   Lymphs Abs 1.9  0.7 - 4.0 K/uL   Monocytes Relative 16 (*) 3 - 12 %   Monocytes Absolute 1.3 (*) 0.1 - 1.0 K/uL   Eosinophils Relative 0  0 - 5 %   Eosinophils Absolute 0.0  0.0 - 0.7 K/uL   Basophils Relative 0  0 - 1 %   Basophils Absolute 0.0  0.0 - 0.1 K/uL  COMPREHENSIVE METABOLIC PANEL      Result Value Range   Sodium 140  135 - 145 mEq/L   Potassium 4.6  3.5 - 5.1 mEq/L   Chloride 105  96 - 112 mEq/L   CO2 26  19 - 32 mEq/L   Glucose, Bld 88  70 - 99 mg/dL   BUN 17  6 - 23 mg/dL   Creatinine, Ser 1.61  0.50 - 1.10 mg/dL   Calcium 9.0  8.4 - 09.6 mg/dL   Total Protein 6.8  6.0 - 8.3 g/dL   Albumin 3.8  3.5 - 5.2 g/dL   AST 14  0 - 37 U/L   ALT 10  0 - 35 U/L   Alkaline Phosphatase 73  39 - 117 U/L   Total Bilirubin 0.5  0.3 - 1.2 mg/dL   GFR calc non Af Amer 62 (*) >90 mL/min   GFR calc Af Amer 72 (*) >90 mL/min  MAGNESIUM      Result Value Range   Magnesium 2.0  1.5 - 2.5 mg/dL  URINE MICROSCOPIC-ADD ON      Result Value Range   WBC, UA 3-6  <3 WBC/hpf   RBC / HPF 0-2  <3 RBC/hpf   Bacteria, UA FEW (*) RARE   POCT I-STAT TROPONIN I      Result Value Range   Troponin i, poc 0.01  0.00 - 0.08 ng/mL   Comment 3            Results for orders placed during the hospital encounter of 01/15/13  URINALYSIS, ROUTINE W REFLEX MICROSCOPIC      Result Value Range   Color, Urine YELLOW  YELLOW   APPearance CLEAR  CLEAR   Specific Gravity, Urine 1.010  1.005 - 1.030   pH 6.0  5.0 - 8.0   Glucose, UA NEGATIVE  NEGATIVE mg/dL   Hgb urine dipstick MODERATE (*) NEGATIVE   Bilirubin Urine NEGATIVE  NEGATIVE   Ketones, ur NEGATIVE  NEGATIVE mg/dL   Protein, ur NEGATIVE  NEGATIVE mg/dL   Urobilinogen, UA 0.2  0.0 - 1.0 mg/dL   Nitrite NEGATIVE  NEGATIVE   Leukocytes, UA SMALL (*) NEGATIVE  CBC WITH DIFFERENTIAL      Result Value Range   WBC 8.0  4.0 - 10.5 K/uL   RBC 4.45  3.87 - 5.11 MIL/uL   Hemoglobin 12.7  12.0 - 15.0 g/dL   HCT 04.5  40.9 - 81.1 %   MCV 87.0  78.0 - 100.0 fL   MCH 28.5  26.0 - 34.0 pg   MCHC 32.8  30.0 - 36.0 g/dL   RDW 91.4  78.2 - 95.6 %   Platelets 148 (*) 150 - 400 K/uL   Neutrophils Relative % 59  43 - 77 %   Neutro Abs  4.8  1.7 - 7.7 K/uL   Lymphocytes Relative 24  12 - 46 %   Lymphs Abs 1.9  0.7 - 4.0 K/uL   Monocytes Relative 16 (*) 3 - 12 %   Monocytes Absolute 1.3 (*) 0.1 - 1.0 K/uL   Eosinophils Relative 0  0 - 5 %   Eosinophils Absolute 0.0  0.0 - 0.7 K/uL   Basophils Relative 0  0 - 1 %   Basophils Absolute 0.0  0.0 - 0.1 K/uL  COMPREHENSIVE METABOLIC PANEL      Result Value Range   Sodium 140  135 - 145 mEq/L   Potassium 4.6  3.5 - 5.1 mEq/L   Chloride 105  96 - 112 mEq/L   CO2 26  19 - 32 mEq/L   Glucose, Bld 88  70 - 99 mg/dL   BUN 17  6 - 23 mg/dL   Creatinine, Ser 4.09  0.50 - 1.10 mg/dL   Calcium 9.0  8.4 - 81.1 mg/dL   Total Protein 6.8  6.0 - 8.3 g/dL   Albumin 3.8  3.5 - 5.2 g/dL   AST 14  0 - 37 U/L   ALT 10  0 - 35 U/L   Alkaline Phosphatase 73  39 - 117 U/L   Total Bilirubin 0.5  0.3 - 1.2 mg/dL   GFR calc non Af Amer 62 (*) >90 mL/min    GFR calc Af Amer 72 (*) >90 mL/min  MAGNESIUM      Result Value Range   Magnesium 2.0  1.5 - 2.5 mg/dL  URINE MICROSCOPIC-ADD ON      Result Value Range   WBC, UA 3-6  <3 WBC/hpf   RBC / HPF 0-2  <3 RBC/hpf   Bacteria, UA FEW (*) RARE  POCT I-STAT TROPONIN I      Result Value Range   Troponin i, poc 0.01  0.00 - 0.08 ng/mL   Comment 3              Date: 01/16/2013  Rate: irregularly irregular  Rhythm: atrial fibrillation  QRS Axis: normal  Intervals: normal  ST/T Wave abnormalities: indeterminate  Conduction Disutrbances:none  Narrative Interpretation: Atrial fibrillation  Old EKG Reviewed: unchanged   MDM  Patient's 67 year old female with history of atrial fibrillation in the past and also hypothyroid presents emergency department at the discretion of the urgent care clinic for atrophic relation. Patient was having vague complaint of chest sensation. Heart rate on arrival initially elevated but on exam in the 90s consistently. She also complains of burning on urination. She denies any chest pain, shortness of breath, lightheadedness, or dizziness. She denies any history of congestive heart failure, coronary artery disease, or abnormal cardiac workups in the past.  EKG significant for atrial fibrillation. Troponin negative. Considering the length of time of her symptoms and negative troponin doubt ACS at this time. No electrolyte abnormalities to suggest etiology of her atrial fibrillation. Patient started on diltiazem with improvement of her rate. She had near resolution of her symptoms as well. Cardiology consult and and I spoke with Dr. Meta Hatchet who reviewed the case with me. He indicated that admission is not necessary at this time. He recommended starting long-acting diltiazem 120 mg daily. He also recommend following up with her primary care physician or cardiologist. Patient was agreeable to this plan indicated she would follow up with her PCP or cardiologist which she can do.  She was given Keflex as her UA and symptoms reflect a  UTI. 2 strongly return precautions including worsening symptoms or any other alarming or concerning symptoms or issues. I discussed this patient's care to my attending, Dr.DeLo.    Sena Hitch, MD 01/16/13 [phone]

## 2013-01-15 NOTE — ED Notes (Signed)
Pt sent from [facility] for reported Afib. Pt reports a history Of same and this episode started on Friday night. Pt has a Pacemaker.

## 2013-01-15 NOTE — Progress Notes (Signed)
[facility] [address] [place] [address] [phone]  Date:  01/15/2013   Name:  Sally Wise   DOB:  [birth year]   MRN:  [id number]  PCP:  Daisy Floro, MD    Chief Complaint: No chief complaint on file.   History of Present Illness:  Sally Wise is a 67 y.o. very pleasant female patient who presents with the following:  History of atrial fibrillation in the past- she had had 2 episodes that she knows of, one of which was treated with IV medications at the ER.   However, she has not had any trouble with a fib in some years and is no longer seeing her cardiologist. Her past cardiologist has retired from [place].  Yesterday evening she noted possible return of her a fib- she noted a feeling of racing heart, and a strange feeling in her throat.  She is also feeling a bit short of breath today.  She thought she was probably back in a fib She takes a thyroid medication, but there has been no recent change in her dosage.  She does not drink caffeine usually, and does not drink alcohol.    She is otherwise generally healthy except for obesity.  Labs done 10/2012- TSH was normal at that time.    There are no active problems to display for this patient.   Past Medical History  Diagnosis Date  . Hematuria     evaluation with Dr. Etta Grandchild  . Hypothyroidism 06/2010  . Kidney stone 04/2004  . Vitamin D deficiency 06/2010  . Atrial fibrillation 03/1999   . Lichen sclerosus et atrophicus 2/13    biopsy proven    Past Surgical History  Procedure Laterality Date  . Tubal ligation  5/77  . Hysteroscopy w/d&c  8/99  . Colonoscopy  04/2003       . 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 Smoking status: Never Smoker   . Smokeless tobacco: Never Used  . Alcohol Use: No    Family History  Problem Relation Age of Onset  . Hypertension Mother   . Kidney disease Mother   . Alzheimer's disease Father   .  Diabetes Brother   . Hypertension Brother     No Known Allergies  Medication list has been reviewed and updated.  Current Outpatient Prescriptions on File Prior to Visit  Medication Sig Dispense Refill  . clobetasol ointment (TEMOVATE) 0.05 % Apply topically 2 (two) times daily. Apply as directed twice daily  60 g  0  . levothyroxine (SYNTHROID) 75 MCG tablet Take 1 tablet (75 mcg total) by mouth daily before breakfast.  30 tablet  11  . Vitamin D, Ergocalciferol, (DRISDOL) 50000 UNITS CAPS Take 50,000 Units by mouth every 7 (seven) days.       No current facility-administered medications on file prior to visit.    Review of Systems:  As per HPI- otherwise negative.   Physical Examination: Filed Vitals:   01/15/13 1314  BP: 144/84  Pulse: 150  Temp: 97.8 F (36.6 C)  Resp: 18   Filed Vitals:   01/15/13 1314  Height: 5' 8.5" (1.74 m)  Weight: 349 lb (158.305 kg)   Body mass index is 52.29 kg/(m^2). Ideal Body Weight: Weight in (lb) to have BMI = 25: 166.5  GEN: WDWN, NAD, Non-toxic, A & O x 3, obese HEENT: Atraumatic, Normocephalic. Neck supple. No masses, No  LAD. Ears and Nose: No external deformity. CV: irregularly irregular rate, rapid. No M/G/R. No JVD. No thrill. No extra heart sounds. PULM: CTA B, no wheezes, crackles, rhonchi. No retractions. No resp. distress. No accessory muscle use. ABD: S, NT, ND. No rebound. No HSM. EXTR: No c/c/e NEURO Normal gait.  PSYCH: Normally interactive. Conversant. Not depressed or anxious appearing.  Calm demeanor.   EKG:  Atrial fibrillation with rapid ventricular response.  Her rate is variable, but rapid  Assessment and Plan: Palpitations - Plan: EKG 12-Lead  Atrial fibrillation  Arlicia is here today with a fib and RVR.  Will transfer her to the ER for further evaluation and treatment.  Appreciate ED care of this nice patient.   Signed Abbe Amsterdam, MD

## 2013-01-16 LAB — TSH: TSH: 3.584 u[IU]/mL (ref 0.350–4.500)

## 2013-01-17 LAB — URINE CULTURE: Colony Count: >=100000

## 2013-01-17 NOTE — ED Provider Notes (Signed)
I saw and evaluated the patient, reviewed the resident's note and I agree with the findings and plan. The patient presents with a strange sensation in her chest that started last night.  She has a history of afib but this feels different.  She denies any chest pain or shortness of breath.    On exam, the patient is afebrile and the vitals are stable.  The heart is irregularly irregular without murmurs.  The lungs are clear and equal.  The abdomen is benign.  There is no edema.  The workup reveals atrial fibrillation on the ekg, but negative troponin.  I reviewed and agree with the resident's interpretation of the ekg.  She was given cardizem which slowed her rate.  We have consulted the cardiology fellow on call and discussed the case with him.  He recommends cardizem, discharge, and outpatient follow up.  This is what the patient prefers.  She will be prescribed cardizem and arrangements for follow up with Deboraha Sprang will be made.  To return prn.   Geoffery Lyons, MD 01/17/13 0130

## 2013-01-18 NOTE — ED Notes (Signed)
Post ED Visit - Positive Culture Follow-up  Culture report reviewed by antimicrobial stewardship pharmacist: []  Wes Dulaney, Pharm.D., BCPS []  Celedonio Miyamoto, Pharm.D., BCPS [x]  Georgina Pillion, Pharm.D., BCPS []  [place], [address].D., BCPS, AAHIVP []  Estella Husk, Pharm.D., BCPS, AAHIVP  Positive urine  culture Treated with Cephalexin, organism sensitive to the same and no further patient follow-up is required at this time.  Larena Sox 01/18/2013, 2:10 PM

## 2013-01-25 ENCOUNTER — Ambulatory Visit (INDEPENDENT_AMBULATORY_CARE_PROVIDER_SITE_OTHER): Payer: Medicare Other | Admitting: Cardiovascular Disease

## 2013-01-25 ENCOUNTER — Encounter: Payer: Self-pay | Admitting: Cardiovascular Disease

## 2013-01-25 VITALS — BP 136/90 | HR 88 | Ht 68.0 in | Wt 338.0 lb

## 2013-01-25 DIAGNOSIS — E039 Hypothyroidism, unspecified: Secondary | ICD-10-CM

## 2013-01-25 DIAGNOSIS — I4891 Unspecified atrial fibrillation: Secondary | ICD-10-CM

## 2013-01-25 MED ORDER — DILTIAZEM HCL ER COATED BEADS 120 MG PO CP24: 120.0000 mg | ORAL_CAPSULE | Freq: Every day | ORAL | Status: DC

## 2013-01-25 NOTE — Patient Instructions (Addendum)
Your physician wants you to follow-up in:   6 MONTHS  WITH DR Haywood Filler will receive a reminder letter in the mail two months in advance. If you don't receive a letter, please call our office to schedule the follow-up appointment. Your physician has requested that you have an echocardiogram. Echocardiography is a painless test that uses sound waves to create images of your heart. It provides your doctor with information about the size and shape of your heart and how well your heart's chambers and valves are working. This procedure takes approximately one hour. There are no restrictions for this procedure.

## 2013-01-25 NOTE — Assessment & Plan Note (Signed)
TSH normal continue replacement  

## 2013-01-25 NOTE — Progress Notes (Signed)
Patient ID: Sally Wise, female   DOB: 1946-06-18, 67 y.o.   MRN: [id number]  67 year old female with past medical history of hypothyroidism, and atrial fibrillation referred  as a recommendation urgent care clinic for atrial fibrillation.7/5 "noted a funny feeling in my chest". This feeling persisted in the morning and prompted her to go to the urgent care clinic. Patient reports having an episode of atrial fibrillation in 2000 which she had an extensive workup for, which she reports that it resolved. She can't describe if this sensation persists at this time. She does report that it is less intense than her last known episode of atrial fibrillation. The sensation does not radiate, there is no known exacerbating or relieving factors. She denies specifically shortness of breath, chest pain, nausea, vomiting, diaphoresis, abdominal pain. She denies being on any rate her rhythm control agents for atrial fibrillation. She reports being anticoagulated for approximately one week with the last episode. She denies any recent changes in her Synthroid dose. She denies alcohol use. She does report burning with urination and urinary frequency. This has been occurring for 3-4 days and worsening. Denies hematuria. Denies frequent history of UTIs. No recent treatment for UTI.  She was started on cardizem and discharged home  Feels well No recent echo.  Discussed obesity with her Has had both knees replaced.  Starting weight watchers.  ROS: Denies fever, malais, weight loss, blurry vision, decreased visual acuity, cough, sputum, SOB, hemoptysis, pleuritic pain, palpitaitons, heartburn, abdominal pain, melena, lower extremity edema, claudication, or rash.  All other systems reviewed and negative   General: Affect appropriate Obese white female  HEENT: normal Neck supple with no adenopathy JVP normal no bruits no thyromegaly Lungs clear with no wheezing and good diaphragmatic motion Heart:  S1/S2 no murmur,rub,  gallop or click PMI normal Abdomen: benighn, BS positve, no tenderness, no AAA no bruit.  No HSM or HJR Distal pulses intact with no bruits No edema Neuro non-focal Skin warm and dry No muscular weakness  Medications Current Outpatient Prescriptions  Medication Sig Dispense Refill  . diltiazem (CARDIZEM LA) 120 MG 24 hr tablet Take 1 tablet (120 mg total) by mouth daily.  30 tablet  0  . ibuprofen (ADVIL,MOTRIN) 200 MG tablet Take 200-400 mg by mouth every 6 (six) hours as needed for pain.      Marland Kitchen levothyroxine (SYNTHROID) 75 MCG tablet Take 1 tablet (75 mcg total) by mouth daily before breakfast.  30 tablet  11   No current facility-administered medications for this visit.    Allergies Review of patient's allergies indicates no known allergies.  Family History: Family History  Problem Relation Age of Onset  . Hypertension Mother   . Kidney disease Mother   . Alzheimer's disease Father   . Diabetes Brother   . Hypertension Brother     Social History: History   Social History  . Marital Status: Married    Spouse Name: N/A    Number of Children: N/A  . Years of Education: N/A   Occupational History  . Not on file.   Social History Main Topics  . Smoking status: Never Smoker   . Smokeless tobacco: Never Used  . Alcohol Use: No  . Drug Use: No  . Sexually Active: Yes -- Female partner(s)    Birth Control/ Protection: Surgical   Other Topics Concern  . Not on file   Social History Narrative  . No narrative on file    Electrocardiogram:  01/15/13  Afib  rate 100 no ishcemic changes   Today NSR  Insig Q 3, and F rate 65  Assessment and Plan

## 2013-01-25 NOTE — Assessment & Plan Note (Signed)
[place] score 0 Infrequent episodes TSH ok  F/U echo change to generic cardizem and continue ASA

## 2013-01-25 NOTE — Assessment & Plan Note (Signed)
Discussed bariatric surgery patient not interested Continue weight watchers

## 2013-01-31 ENCOUNTER — Ambulatory Visit (HOSPITAL_COMMUNITY): Payer: Medicare Other | Attending: Cardiology

## 2013-01-31 DIAGNOSIS — I4891 Unspecified atrial fibrillation: Secondary | ICD-10-CM | POA: Insufficient documentation

## 2013-01-31 NOTE — Progress Notes (Signed)
Echocardiogram performed.  

## 2013-08-02 ENCOUNTER — Encounter (HOSPITAL_BASED_OUTPATIENT_CLINIC_OR_DEPARTMENT_OTHER): Payer: Self-pay | Admitting: *Deleted

## 2013-08-02 NOTE — Progress Notes (Signed)
To come in for bmet 

## 2013-08-03 ENCOUNTER — Other Ambulatory Visit: Payer: Self-pay | Admitting: Orthopedic Surgery

## 2013-08-05 ENCOUNTER — Encounter (HOSPITAL_BASED_OUTPATIENT_CLINIC_OR_DEPARTMENT_OTHER)
Admission: RE | Admit: 2013-08-05 | Discharge: 2013-08-05 | Disposition: A | Discharge status: Home / Self Care | Payer: Medicare Other | Source: Ambulatory Visit | Attending: Orthopedic Surgery | Admitting: Orthopedic Surgery

## 2013-08-05 ENCOUNTER — Encounter: Payer: Self-pay | Admitting: Cardiovascular Disease

## 2013-08-05 DIAGNOSIS — Z01812 Encounter for preprocedural laboratory examination: Secondary | ICD-10-CM | POA: Insufficient documentation

## 2013-08-05 DIAGNOSIS — Z01818 Encounter for other preprocedural examination: Secondary | ICD-10-CM | POA: Insufficient documentation

## 2013-08-05 LAB — BASIC METABOLIC PANEL
BUN: 19 mg/dL (ref 6–23)
CO2: 23 mEq/L (ref 19–32)
CREATININE: 0.84 mg/dL (ref 0.50–1.10)
Calcium: 9.2 mg/dL (ref 8.4–10.5)
Chloride: 105 mEq/L (ref 96–112)
GFR, EST AFRICAN AMERICAN: 80 mL/min — AB (ref >90)
GFR, EST NON AFRICAN AMERICAN: 69 mL/min — AB (ref >90)
Glucose, Bld: 109 mg/dL — ABNORMAL HIGH (ref 70–99)
POTASSIUM: 4.8 meq/L (ref 3.7–5.3)
Sodium: 144 mEq/L (ref 137–147)

## 2013-08-08 NOTE — H&P (Signed)
Sally Wise is an 68 y.o. female.   Chief Complaint: c/o chronic and progressive numbness and tingling of the left hand and STS symptoms left ring finger HPI:  She has had increasing arm discomfort that has responded to therapy but of late has been associated with hand numbness, left worse than right. She has been awakening at night and in the mornings with numbness in her hands. She notes that during periods of inability during the day that her hands fall asleep.   She has had extensive therapy and has had improvement in her shoulder and proximal arm pain symptoms. Her arm pain at this time is a deep ache that could represent referred pain from her carpal tunnels or represent a cervical origin discomfort.   Past Medical History  Diagnosis Date  . Hematuria     evaluation with Dr. Etta GrandchildSural  . Hypothyroidism 06/2010  . Kidney stone 04/2004  . Vitamin D deficiency 06/2010  . Atrial fibrillation 03/1999   . Lichen sclerosus et atrophicus 2/13    biopsy proven  . Wears glasses     Past Surgical History  Procedure Laterality Date  . Tubal ligation  5/77  . Hysteroscopy w/d&c  8/99  . Colonoscopy  04/2003       . 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 Tonsillectomy    . Dilation and curettage of uterus    . Breast surgery  2010    lt-neg    Family History  Problem Relation Age of Onset  . Hypertension Mother   . Kidney disease Mother   . Alzheimer's disease Father   . Diabetes Brother   . Hypertension Brother    Social History:  reports that she has never smoked. She has never used smokeless tobacco. She reports that she does not drink alcohol or use illicit drugs.  Allergies: No Known Allergies  No prescriptions prior to admission    No results found for this or any previous visit (from the past 48 hour(s)).  No results found.   Pertinent items are noted in HPI.  Height 5\' 8"  (1.727 m), weight 151.955 kg (335 lb).  General appearance: alert Head:  Normocephalic, without obvious abnormality Neck: supple, symmetrical, trachea midline Resp: clear to auscultation bilaterally Cardio: regular rate and rhythm GI: normal findings: bowel sounds normal Extremities:. She has no thenar atrophy. Her sweat patterns and dermatoglyphics are intact. She has full ROM of her fingers in flexion/extension. She does have provocative signs of carpal tunnel syndrome. Her triggering of her left ring finger is atypical.  It is actually proximal to the A-0 pulley in the mid palm rather than at the usual A-1 pulley position.  She does not show signs of extensor tendon subluxation.  She has no flexion contractures.   We asked Dr. Johna RolesPelligra to perform screening electrodiagnostic studies. These reveal moderately severe left carpal tunnel syndrome and moderate right carpal tunnel syndrome. She has markedly diminished sensory amplitudes left worse than right.   Pulses: 2+ and symmetric Skin: normal Neurologic: Grossly normal    Assessment/Plan Impression: Left CTS and STS of the left ring finger  Plan: To the OR for left CTR and release A-1/A-0 pulleys left ring finger.The procedure, risks,benefits and post-op course were discussed with the patient at length and they were in agreement with the plan.  DASNOIT,Mercede Rollo J 08/08/2013, 9:51 AM   H&P documentation: 08/09/2013  -History and Physical Reviewed  -Patient has been re-examined  -No change in the plan  of care  Cammie Sickle, MD

## 2013-08-09 ENCOUNTER — Ambulatory Visit (HOSPITAL_BASED_OUTPATIENT_CLINIC_OR_DEPARTMENT_OTHER)
Admission: RE | Admit: 2013-08-09 | Discharge: 2013-08-09 | Disposition: A | Discharge status: Home / Self Care | Payer: Medicare Other | Source: Ambulatory Visit | Attending: Orthopedic Surgery | Admitting: Orthopedic Surgery

## 2013-08-09 ENCOUNTER — Ambulatory Visit (HOSPITAL_BASED_OUTPATIENT_CLINIC_OR_DEPARTMENT_OTHER): Payer: Medicare Other | Admitting: Anesthesiology

## 2013-08-09 ENCOUNTER — Encounter (HOSPITAL_BASED_OUTPATIENT_CLINIC_OR_DEPARTMENT_OTHER)
Admission: RE | Disposition: A | Discharge status: Home / Self Care | Payer: Self-pay | Source: Ambulatory Visit | Attending: Orthopedic Surgery

## 2013-08-09 ENCOUNTER — Encounter (HOSPITAL_BASED_OUTPATIENT_CLINIC_OR_DEPARTMENT_OTHER): Payer: Self-pay | Admitting: Orthopedic Surgery

## 2013-08-09 ENCOUNTER — Encounter (HOSPITAL_BASED_OUTPATIENT_CLINIC_OR_DEPARTMENT_OTHER): Payer: Medicare Other | Admitting: Anesthesiology

## 2013-08-09 DIAGNOSIS — I4891 Unspecified atrial fibrillation: Secondary | ICD-10-CM | POA: Insufficient documentation

## 2013-08-09 DIAGNOSIS — M659 Unspecified synovitis and tenosynovitis, unspecified site: Secondary | ICD-10-CM | POA: Insufficient documentation

## 2013-08-09 DIAGNOSIS — M6530 Trigger finger, unspecified finger: Secondary | ICD-10-CM | POA: Insufficient documentation

## 2013-08-09 DIAGNOSIS — G5600 Carpal tunnel syndrome, unspecified upper limb: Secondary | ICD-10-CM | POA: Insufficient documentation

## 2013-08-09 DIAGNOSIS — Z6841 Body Mass Index (BMI) 40.0 and over, adult: Secondary | ICD-10-CM | POA: Insufficient documentation

## 2013-08-09 HISTORY — PX: TRIGGER FINGER RELEASE: SHX641

## 2013-08-09 HISTORY — PX: CARPAL TUNNEL RELEASE: SHX101

## 2013-08-09 HISTORY — DX: Presence of spectacles and contact lenses: Z97.3

## 2013-08-09 LAB — POCT HEMOGLOBIN-HEMACUE: HEMOGLOBIN: 12.1 g/dL (ref 12.0–15.0)

## 2013-08-09 SURGERY — CARPAL TUNNEL RELEASE
Anesthesia: Monitor Anesthesia Care | Site: Hand | Laterality: Left

## 2013-08-09 MED ORDER — ONDANSETRON HCL 4 MG/2ML IJ SOLN
INTRAMUSCULAR | Status: DC | PRN
  Administered 2013-08-09: 4 mg via INTRAVENOUS

## 2013-08-09 MED ORDER — PROPOFOL 10 MG/ML IV EMUL
INTRAVENOUS | Status: AC
  Filled 2013-08-09: qty 50

## 2013-08-09 MED ORDER — FENTANYL CITRATE 0.05 MG/ML IJ SOLN
INTRAMUSCULAR | Status: DC | PRN
  Administered 2013-08-09: 100 ug via INTRAVENOUS

## 2013-08-09 MED ORDER — FENTANYL CITRATE 0.05 MG/ML IJ SOLN: 25.0000 ug | INTRAMUSCULAR | Status: DC | PRN

## 2013-08-09 MED ORDER — PROPOFOL 10 MG/ML IV BOLUS
INTRAVENOUS | Status: DC | PRN
  Administered 2013-08-09: 70 mg via INTRAVENOUS
  Administered 2013-08-09: 30 mg via INTRAVENOUS

## 2013-08-09 MED ORDER — MIDAZOLAM HCL 2 MG/2ML IJ SOLN
INTRAMUSCULAR | Status: AC
  Filled 2013-08-09: qty 2

## 2013-08-09 MED ORDER — MIDAZOLAM HCL 2 MG/2ML IJ SOLN: 1.0000 mg | INTRAMUSCULAR | Status: DC | PRN

## 2013-08-09 MED ORDER — FENTANYL CITRATE 0.05 MG/ML IJ SOLN
INTRAMUSCULAR | Status: AC
  Filled 2013-08-09: qty 2

## 2013-08-09 MED ORDER — FENTANYL CITRATE 0.05 MG/ML IJ SOLN: 50.0000 ug | Freq: Once | INTRAMUSCULAR | Status: DC

## 2013-08-09 MED ORDER — LIDOCAINE HCL 2 % IJ SOLN
INTRAMUSCULAR | Status: DC | PRN
  Administered 2013-08-09: 8 mL

## 2013-08-09 MED ORDER — LACTATED RINGERS IV SOLN
INTRAVENOUS | Status: DC
  Administered 2013-08-09: 08:00:00 via INTRAVENOUS

## 2013-08-09 MED ORDER — PROMETHAZINE HCL 25 MG/ML IJ SOLN: 6.2500 mg | INTRAMUSCULAR | Status: DC | PRN

## 2013-08-09 MED ORDER — MIDAZOLAM HCL 5 MG/5ML IJ SOLN
INTRAMUSCULAR | Status: DC | PRN
  Administered 2013-08-09: 2 mg via INTRAVENOUS

## 2013-08-09 MED ORDER — OXYCODONE HCL 5 MG/5ML PO SOLN: 5.0000 mg | Freq: Once | ORAL | Status: DC | PRN

## 2013-08-09 MED ORDER — OXYCODONE HCL 5 MG PO TABS: 5.0000 mg | ORAL_TABLET | Freq: Once | ORAL | Status: DC | PRN

## 2013-08-09 MED ORDER — HYDROCODONE-ACETAMINOPHEN 5-325 MG PO TABS: 1.0000 | ORAL_TABLET | Freq: Four times a day (QID) | ORAL | Status: DC | PRN

## 2013-08-09 MED ORDER — CHLORHEXIDINE GLUCONATE 4 % EX LIQD
60.0000 mL | Freq: Once | CUTANEOUS | Status: DC
Start: 2013-08-09 — End: 2013-08-09

## 2013-08-09 MED ORDER — FENTANYL CITRATE 0.05 MG/ML IJ SOLN: 50.0000 ug | INTRAMUSCULAR | Status: DC | PRN

## 2013-08-09 SURGICAL SUPPLY — 44 items
BANDAGE ADH SHEER 1  50/CT (GAUZE/BANDAGES/DRESSINGS) IMPLANT
BANDAGE COBAN STERILE 2 (GAUZE/BANDAGES/DRESSINGS) IMPLANT
BANDAGE ELASTIC 3 VELCRO ST LF (GAUZE/BANDAGES/DRESSINGS) IMPLANT
BLADE SURG 15 STRL LF DISP TIS (BLADE) ×1 IMPLANT
BLADE SURG 15 STRL SS (BLADE) ×1
BNDG COHESIVE 3X5 TAN STRL LF (GAUZE/BANDAGES/DRESSINGS) ×2 IMPLANT
BNDG ESMARK 4X9 LF (GAUZE/BANDAGES/DRESSINGS) ×2 IMPLANT
BRUSH SCRUB EZ PLAIN DRY (MISCELLANEOUS) ×2 IMPLANT
CORDS BIPOLAR (ELECTRODE) ×2 IMPLANT
COVER MAYO STAND STRL (DRAPES) ×2 IMPLANT
COVER TABLE BACK 60X90 (DRAPES) ×2 IMPLANT
CUFF TOURNIQUET SINGLE 18IN (TOURNIQUET CUFF) IMPLANT
CUFF TOURNIQUET SINGLE 24IN (TOURNIQUET CUFF) ×2 IMPLANT
DECANTER SPIKE VIAL GLASS SM (MISCELLANEOUS) ×2 IMPLANT
DRAPE EXTREMITY T 121X128X90 (DRAPE) ×2 IMPLANT
DRAPE SURG 17X23 STRL (DRAPES) ×2 IMPLANT
GLOVE BIOGEL M STRL SZ7.5 (GLOVE) IMPLANT
GLOVE BIOGEL PI IND STRL 7.0 (GLOVE) ×2 IMPLANT
GLOVE BIOGEL PI INDICATOR 7.0 (GLOVE) ×2
GLOVE ECLIPSE 7.0 STRL STRAW (GLOVE) ×2 IMPLANT
GLOVE ORTHO TXT STRL SZ7.5 (GLOVE) ×2 IMPLANT
GOWN STRL REUS W/ TWL LRG LVL3 (GOWN DISPOSABLE) ×1 IMPLANT
GOWN STRL REUS W/TWL LRG LVL3 (GOWN DISPOSABLE) ×1
GOWN STRL REUS W/TWL XL LVL3 (GOWN DISPOSABLE) ×2 IMPLANT
NEEDLE 27GAX1X1/2 (NEEDLE) ×2 IMPLANT
PACK BASIN DAY SURGERY FS (CUSTOM PROCEDURE TRAY) ×2 IMPLANT
PAD CAST 3X4 CTTN HI CHSV (CAST SUPPLIES) ×1 IMPLANT
PADDING CAST ABS 4INX4YD NS (CAST SUPPLIES)
PADDING CAST ABS COTTON 4X4 ST (CAST SUPPLIES) IMPLANT
PADDING CAST COTTON 3X4 STRL (CAST SUPPLIES) ×1
SPLINT PLASTER CAST XFAST 3X15 (CAST SUPPLIES) ×5 IMPLANT
SPLINT PLASTER XTRA FASTSET 3X (CAST SUPPLIES) ×5
SPONGE GAUZE 4X4 12PLY (GAUZE/BANDAGES/DRESSINGS) ×2 IMPLANT
STOCKINETTE 4X48 STRL (DRAPES) ×2 IMPLANT
STRIP CLOSURE SKIN 1/2X4 (GAUZE/BANDAGES/DRESSINGS) ×2 IMPLANT
SUT ETHILON 5 0 P 3 18 (SUTURE)
SUT NYLON ETHILON 5-0 P-3 1X18 (SUTURE) IMPLANT
SUT PROLENE 3 0 PS 2 (SUTURE) ×2 IMPLANT
SUT PROLENE 4 0 P 3 18 (SUTURE) IMPLANT
SYR 3ML 23GX1 SAFETY (SYRINGE) IMPLANT
SYR CONTROL 10ML LL (SYRINGE) ×2 IMPLANT
TOWEL OR 17X24 6PK STRL BLUE (TOWEL DISPOSABLE) ×2 IMPLANT
TRAY DSU PREP LF (CUSTOM PROCEDURE TRAY) ×2 IMPLANT
UNDERPAD 30X30 INCONTINENT (UNDERPADS AND DIAPERS) ×2 IMPLANT

## 2013-08-09 NOTE — Discharge Instructions (Addendum)

## 2013-08-09 NOTE — Brief Op Note (Signed)
08/09/2013  9:49 AM  PATIENT:  Sally Wise  68 y.o. female  PRE-OPERATIVE DIAGNOSIS:  LEFT CARPAL TUNNEL SYNDROME/ STENOSING TENOSYNOVITIS/A1 PULLEY  POST-OPERATIVE DIAGNOSIS: L EFT CARPAL TUNNEL SYNDROME/ STENOSING TENOSYNOVITIS/A1 PULLEY  PROCEDURE: LEFT CARPAL TUNNEL EXPLORATION AND RELEASE, LEFT LONG A-0 AND A-1 RELEASE WITH LIMITED SYNOVECTOMY   SURGEON:  Surgeon(s) and Role:    * Wyn Forsterobert V Mays Paino Jr., MD - Primary  PHYSICIAN ASSISTANT:   ASSISTANTS:  Surgical tech  ANESTHESIA:   IV sedation  EBL:  Total I/O In: 500 [I.V.:500] Out: -   BLOOD ADMINISTERED:none  DRAINS: none   LOCAL MEDICATIONS USED:  XYLOCAINE   SPECIMEN:  No Specimen  DISPOSITION OF SPECIMEN:  N/A  COUNTS:  YES  TOURNIQUET:   Total Tourniquet Time Documented: Upper Arm (Left) - 19 minutes Total: Upper Arm (Left) - 19 minutes   DICTATION: .Other Dictation: Dictation Number [id number]  PLAN OF CARE: Discharge to home after PACU  PATIENT DISPOSITION:  PACU - hemodynamically stable.   Delay start of Pharmacological VTE agent (>24hrs) due to surgical blood loss or risk of bleeding: not applicable

## 2013-08-09 NOTE — Anesthesia Postprocedure Evaluation (Signed)
  Anesthesia Post-op Note  Patient: Sally Wise  Procedure(s) Performed: Procedure(s): LEFT CARPAL TUNNEL RELEASE (Left) LEFT A1/A2 PULLEY RELEASE (Left)  Patient Location: PACU  Anesthesia Type:MAC  Level of Consciousness: awake  Airway and Oxygen Therapy: Patient Spontanous Breathing  Post-op Pain: none  Post-op Assessment: Post-op Vital signs reviewed, Patient's Cardiovascular Status Stable, Respiratory Function Stable, Patent Airway, No signs of Nausea or vomiting and Pain level controlled  Post-op Vital Signs: Reviewed and stable  Complications: No apparent anesthesia complications

## 2013-08-09 NOTE — Anesthesia Preprocedure Evaluation (Addendum)
Anesthesia Evaluation  Patient identified by MRN, date of birth, ID band Patient awake    Reviewed: Allergy & Precautions, H&P , NPO status , Patient's Chart, lab work & pertinent test results  Airway Mallampati: II TM Distance: >3 FB Neck ROM: Full    Dental   Pulmonary  breath sounds clear to auscultation        Cardiovascular + dysrhythmias Atrial Fibrillation Rhythm:Regular Rate:Normal     Neuro/Psych    GI/Hepatic   Endo/Other  Morbid obesity  Renal/GU      Musculoskeletal   Abdominal (+) + obese,   Peds  Hematology   Anesthesia Other Findings   Reproductive/Obstetrics                           Anesthesia Physical Anesthesia Plan  ASA: III  Anesthesia Plan: General and MAC   Post-op Pain Management:    Induction: Intravenous  Airway Management Planned: Mask  Additional Equipment:   Intra-op Plan:   Post-operative Plan:   Informed Consent: I have reviewed the patients History and Physical, chart, labs and discussed the procedure including the risks, benefits and alternatives for the proposed anesthesia with the patient or authorized representative who has indicated his/her understanding and acceptance.     Plan Discussed with: CRNA and Surgeon  Anesthesia Plan Comments:        Anesthesia Quick Evaluation

## 2013-08-09 NOTE — Op Note (Signed)
[id number]  

## 2013-08-09 NOTE — Transfer of Care (Signed)
Immediate Anesthesia Transfer of Care Note  Patient: Sally Wise Obst  Procedure(s) Performed: Procedure(s): LEFT CARPAL TUNNEL RELEASE (Left) LEFT A1/A2 PULLEY RELEASE (Left)  Patient Location: PACU  Anesthesia Type:MAC  Level of Consciousness: awake, alert  and patient cooperative  Airway & Oxygen Therapy: Patient Spontanous Breathing and Patient connected to face mask oxygen  Post-op Assessment: Report given to PACU RN and Post -op Vital signs reviewed and stable  Post vital signs: Reviewed and stable  Complications: No apparent anesthesia complications

## 2013-08-10 NOTE — Op Note (Signed)
NAME:  Wendelyn BreslowKIRKMAN, Shaela              ACCOUNT NO.:  [id number]  MEDICAL RECORD NO.:  [id number]  LOCATION:                                 FACILITY:  PHYSICIAN:  Katy Fitchobert V. Kaelob Persky, M.D.      DATE OF BIRTH:  DATE OF PROCEDURE:  08/09/2013 DATE OF DISCHARGE:                              OPERATIVE REPORT   PREOPERATIVE DIAGNOSES: 1. 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 2. Atypical triggering of left ring finger with crepitation at carpal     tunnel with finger motion. 3. Crepitation and intermittent locking of left long finger at A1 and     A0 pulley with identification of synovitis proximal to A0 pulley.  POSTOPERATIVE DIAGNOSES: 1. 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 2. Atypical triggering of left ring finger with crepitation at carpal     tunnel with finger motion. 3. Crepitation and intermittent locking of left long finger at A1 and     A0 pulley with identification of synovitis proximal to A0 pulley. 4. Synovitis at carpal tunnel, causing triggering at proximal margin     of transverse carpal ligament. 5. Marked synovitis, left long finger, A0 pulley in palm, causing     episodic triggering of the long finger.  OPERATIONS: 1. Release of A0 and A1 pulley, left long finger with synovectomy. 2. Release of left transverse carpal ligament, identifying aberrant     trans-ligamentous motor branch or accessory motor branch and     significant synovitis in ulnar bursa, causing triggering against     proximal margin of transverse carpal ligament.  OPERATING SURGEON:  Katy Fitchobert V. Elliet Goodnow, MD  ASSISTANT:  Surgical technician.  ANESTHESIA:  Monitored anesthesia care with local anesthesia of carpal tunnel and long finger to  allow examination of flexor tendons under anesthesia to facilitate correction of atypical triggering phenomenon.  SUPERVISING ANESTHESIOLOGIST:  Bedelia PersonLee Kasik, MD  INDICATIONS:  Enedina FinnerJanice Buchbinder is a 68 year old patient, well acquainted with our practice.  We have been following her for one year for hand numbness and triggering.  She had a very atypical presentation with respect to her triggering.  She had a popping sensation and pain at the carpal tunnel with movement of her left ring finger and at times crepitation in the palm overlying the long finger flexor sheath.  She had numbness in her hands with a background history of electrodiagnostic studies documenting bilateral carpal tunnel syndrome.  She has failed injection therapy and splinting on the left.  She has developed mild thenar atrophy.  We recommended that she proceed with release of her left transverse carpal ligament this time and after careful inspection in the holding area, confirmed that she had triggering of her ring finger flexors at the carpal tunnel level and her long finger had crepitation at the mid palm A1, A0 pulley region.  In the holding area of the [facility], we elected to modify  our plans somewhat to explore her flexor sheath of the long finger and release the transverse carpal ligament.  Questions were invited and answered in detail.  Gricelda Foland was interviewed in the holding area with her husband present.  We carefully examined her left hand, noting thenar atrophy, triggering at the carpal tunnel level with movement of the ring finger and crepitation and catching on an A0 pulley in the palm.  We advised Ms. Porche to proceed with exploration in the region of the long finger, A1 and A0 pulley, anticipating synovectomy and pulley release and exploration of the carpal canal.  After detailed anesthesia informed consent, she selected monitored anesthesia care.  This is our preferred choice ,  so that we could examine her flexor tendon motion while awake in the OR, only lightly sedated.  Preoperatively, I instructed Ms. Swing about our strategy to ask her to move her fingers at a certain point during the procedure, so that we could examine her tendon motion and confirm her primary locus of triggering.  Questions were invited and answered in detail.  Her left hand was marked per protocol with marking pen, both for the long finger, the palm, and the carpal canal.  DESCRIPTION OF PROCEDURE:  Merlie Noga was brought to room 2 of the [facility] and placed in the supine position on the operating table.  Following light sedation, the left hand was prepped with Betadine and 2% lidocaine infiltrated around the median nerve in the distal forearm in the region of the anticipated carpal tunnel incision.  We also infiltrated around the long and ring finger A1 pulley and A0 pulley.  After approximately five minutes, excellent anesthesia was achieved.  She was lightly sedated.  The left hand and arm were then prepped with Betadine soap and solution, sterilely draped.  No prophylactic antibiotics were provided.  A routine surgical time-out was accomplished followed by exsanguination of the left hand and arm with Esmarch bandage, inflation of arterial tourniquet on proximal brachium to 250 mmHg due to mild systolic hypertension.  Procedure commenced with a short oblique incision between the distal and middle palmar creases at the site of crepitation overlying the long finger flexor sheath.  We immediately encountered a thick cuff of tenosynovium that was visible through the palmar fascia.  The pretendinous fibers of the palmar fascia were released.  The cuff of tenosynovium was resected.  The A0 pulley was noted and was released and the A1 pulley was identified and released.  After synovectomy of the superficialis and profundus tendons was accomplished, Ms. Diop  could fully range the finger without crepitation in the palm.  The ring finger was carefully examined, it did not show signs of crepitation or synovitis at the A1 or A0 pulley.  We then proceeded to perform a carpal tunnel incision that was extended to allow careful inspection of the entire carpal canal.  The subcutaneous tissues were carefully divided and bipolar cautery used for hemostasis. There was considerable subcutaneous adipose tissue.  The palmar fascia was identified and incised longitudinally.  The distal margin of the transverse carpal ligament was identified and with the aid of a Penfield 4 elevator, the carpal canal sounded.  The median nerve was noted to be rather ulnar in its position.  There was a thick thenar muscle that crossed over to the hypothenar muscles, forming a "horseshoe" muscle.  We gently teased the fibers apart and found this to be a very robust muscle.  This may be in part  one of the reasons why there was proximal triggering.  After teasing through the muscle and not identifying any motor branches, I used a Penfield 4 elevator to palpate and did identify something tethering, the median nerve along the radial aspect of the transverse carpal ligament.  We then accomplished a rather ulnar release of the transverse carpal ligament directly off the hook of the hamate.  We retracted the radial leaflet of the transverse carpal ligament and identified a trans- ligamentous motor branch approximately 3 mm in width right at the proximal margin of the transverse carpal ligament.  This was tethering the median nerve at this location.  We also noted a thick wad of fibrotic ulnar bursa deep to the median nerve.  This was carefully examined with a Penfield 4 elevator and retracted with Ragnell retractors.  We ultimately did not identify any masses or other predicaments in the carpal canal other than the thickened tenosynovium of the ulnar bursa.  A formal  tenosynovectomy was not performed at the ulnar bursa.  It appeared that the triggering at the wrist was due to the ring finger superficialis hanging up against the thickened tenosynovium and the palmar crepitation was due to synovitis of the long finger flexors.  This was an unusual presentation.  The trans-ligamentous motor branch was carefully protected.  Bleeding points electrocauterized bipolar current followed by repair of the skin with intradermal 3-0 Prolene in the palm and at the carpal tunnel site.  Steri-Strips were applied followed by a voluminous gauze dressing with a volar plaster splint, maintaining the wrist in 15 degrees of dorsiflexion.  For aftercare, Ms. Mcilwain was provided prescription for hydrocodone 5 mg 1 p.o. q.4-6 hours p.r.n. pain, 20 tablets without refill.     Katy Fitch Dalasia Predmore, M.D.     RVS/MEDQ  D:  08/09/2013  T:  08/10/2013  Job:  [id number]  cc:   Caryn Bee L. Little, M.D. Dr. Miguel Aschoff

## 2013-08-11 ENCOUNTER — Encounter (HOSPITAL_BASED_OUTPATIENT_CLINIC_OR_DEPARTMENT_OTHER): Payer: Self-pay | Admitting: Orthopedic Surgery

## 2013-09-05 ENCOUNTER — Ambulatory Visit (INDEPENDENT_AMBULATORY_CARE_PROVIDER_SITE_OTHER): Payer: Medicare Other | Admitting: Cardiovascular Disease

## 2013-09-05 ENCOUNTER — Encounter: Payer: Self-pay | Admitting: Cardiovascular Disease

## 2013-09-05 VITALS — BP 148/78 | HR 72 | Ht 68.0 in | Wt 335.0 lb

## 2013-09-05 DIAGNOSIS — E039 Hypothyroidism, unspecified: Secondary | ICD-10-CM

## 2013-09-05 LAB — TSH: TSH: 8.11 u[IU]/mL — ABNORMAL HIGH (ref 0.35–5.50)

## 2013-09-05 LAB — T4, FREE: FREE T4: 1.04 ng/dL (ref 0.60–1.60)

## 2013-09-05 MED ORDER — DILTIAZEM HCL ER COATED BEADS 120 MG PO CP24: 120.0000 mg | ORAL_CAPSULE | Freq: Every day | ORAL | Status: DC

## 2013-09-05 NOTE — Patient Instructions (Signed)
Your physician wants you to follow-up in:   6 MONTHS WITH DR Haywood FillerNISHAN You will receive a reminder letter in the mail two months in advance. If you don't receive a letter, please call our office to schedule the follow-up appointment. Your physician recommends that you continue on your current medications as directed. Please refer to the Current Medication list given to you today.  Your physician recommends that you return for lab work in:  TODAY   TSH   FREE T4

## 2013-09-05 NOTE — Assessment & Plan Note (Signed)
Check TSH and T4 today  Encouraged her to f/u with Dr Tenny Crawoss and not just her gynecologist

## 2013-09-05 NOTE — Assessment & Plan Note (Signed)
Maint NSR continue ASA and cardizem

## 2013-09-05 NOTE — Assessment & Plan Note (Signed)
Discussed low carb diet and exercise  Some limitations form knees.  A bit old to consider bariatric referral

## 2013-09-05 NOTE — Progress Notes (Signed)
Patient ID: Sally GrayJanice B Sasaki, female DOB: 05/28/1946, 68 y.o. MRN: [id number]  68 year old female with past medical history of hypothyroidism, and atrial fibrillation referred as a recommendation urgent care clinic for atrial fibrillation.7/5 "noted a funny feeling in my chest". This feeling persisted in the morning and prompted her to go to the urgent care clinic. Patient reports having an episode of atrial fibrillation in 2000 which she had an extensive workup for, which she reports that it resolved. She can't describe if this sensation persists at this time. She does report that it is less intense than her last known episode of atrial fibrillation. The sensation does not radiate, there is no known exacerbating or relieving factors. She denies specifically shortness of breath, chest pain, nausea, vomiting, diaphoresis, abdominal pain. She denies being on any rate her rhythm control agents for atrial fibrillation. She reports being anticoagulated for approximately one week with the last episode.   She was started on cardizem and discharged home Feels well . Discussed obesity with her Has had both knees replaced. Starting weight watchers.  Echo 01/31/13 Reviewed EF 55-60% mild LAE no valve disease   She was started on cardizem and discharged home Feels well No recent echo. Discussed obesity with her Has had both knees replaced. Starting weight watchers. Carpal Tunnel surgery on left hand a month ago    TSH normal 01/15/13  ROS: Denies fever, malais, weight loss, blurry vision, decreased visual acuity, cough, sputum, SOB, hemoptysis, pleuritic pain, palpitaitons, heartburn, abdominal pain, melena, lower extremity edema, claudication, or rash.  All other systems reviewed and negative  General: Affect appropriate Obese white female  HEENT: normal Neck supple with no adenopathy JVP normal no bruits no thyromegaly Lungs clear with no wheezing and good diaphragmatic motion Heart:  S1/S2 no murmur, no rub,  gallop or click PMI normal Abdomen: benighn, BS positve, no tenderness, no AAA no bruit.  No HSM or HJR Distal pulses intact with no bruits No edema Neuro non-focal Skin warm and dry No muscular weakness   Current Outpatient Prescriptions  Medication Sig Dispense Refill  . cholecalciferol (VITAMIN D) 1000 UNITS tablet Take 1,000 Units by mouth daily.      Marland Kitchen. diltiazem (CARDIZEM CD) 120 MG 24 hr capsule Take 1 capsule (120 mg total) by mouth daily.  30 capsule  11  . ibuprofen (ADVIL,MOTRIN) 200 MG tablet Take 200-400 mg by mouth every 6 (six) hours as needed for pain.      Marland Kitchen. levothyroxine (SYNTHROID) 75 MCG tablet Take 1 tablet (75 mcg total) by mouth daily before breakfast.  30 tablet  11   No current facility-administered medications for this visit.    Allergies  Review of patient's allergies indicates no known allergies.  Electrocardiogram:  SR rate 65  Possible IMI   Assessment and Plan

## 2013-09-06 ENCOUNTER — Telehealth: Payer: Self-pay | Admitting: *Deleted

## 2013-09-06 NOTE — Telephone Encounter (Signed)
Notes Recorded by Wendall StadePeter C Nishan, MD on 09/05/2013 at 9:09 PM TSH a little high should discuss with primary going up on synthroid by 25ug/day ------  Notes Recorded by Alois Clichehristine E York, LPN on 0/98/11912/23/2015 at 3:15 PM  PT  AWARE OF LAB RESULTS   COPY  FORWARDED TO  DR  Duane LopeALAN  ROSS AND  DR Valentina ShaggyMARY MILLER .Zack Seal/CY

## 2013-09-07 ENCOUNTER — Telehealth: Payer: Self-pay | Admitting: Emergency Medicine

## 2013-09-07 NOTE — Telephone Encounter (Signed)
Spoke with patient. Her PCP is Dr. Tenny Crawoss at [facility]. She has an appointment with him 09/12/13. She will have him handle medication change. Requested that I send results to Dr. Tenny Crawoss, faxed recent results with fax confirmation received. Also, patient requests that results be mailed to her as well, results placed in mail per her request.   Routing to provider for final review. Patient agreeable to disposition. Will close encounter

## 2013-09-07 NOTE — Telephone Encounter (Signed)
Message copied by Joeseph AmorFAST, Tremell Reimers L on Wed Sep 07, 2013  8:42 AM ------      Message from: Jerene BearsMILLER, MARY S      Created: Tue Sep 06, 2013  8:57 PM      Regarding: TSH done with Dr. Kathrine CordsNishan       Dorinda Stehr,      Mrs. Cory RoughenKirkman had blood work with Dr. Eden EmmsNishan, her cardiologist.  TSH was 8.  He sent me a copy and suggested increasing her synthroid.  I am happy to do this but she does have a PCP.  Can you call her and ask if she wants me to change her dosage or PCP?  If me--increase to 100mcg daily and recheck TSH 3 months.  Thanks.            MSM ------

## 2013-12-08 ENCOUNTER — Encounter: Payer: Self-pay | Admitting: Obstetrics & Gynecology

## 2013-12-08 ENCOUNTER — Ambulatory Visit (INDEPENDENT_AMBULATORY_CARE_PROVIDER_SITE_OTHER): Payer: Medicare Other | Admitting: Obstetrics & Gynecology

## 2013-12-08 VITALS — BP 138/82 | HR 60 | Resp 16 | Ht 67.75 in | Wt 318.4 lb

## 2013-12-08 DIAGNOSIS — Z01419 Encounter for gynecological examination (general) (routine) without abnormal findings: Secondary | ICD-10-CM

## 2013-12-08 MED ORDER — CLOBETASOL PROPIONATE 0.05 % EX OINT: 1.0000 "application " | TOPICAL_OINTMENT | Freq: Two times a day (BID) | CUTANEOUS | Status: DC

## 2013-12-08 NOTE — Progress Notes (Signed)
68 y.o. G2P2 MarriedCaucasianF here for annual exam.  Doing well.  Patient went to the ER in July due to a fib.  Seeing Dr. Eden Emms.  This was third episode since 2000.  Does have hypothyroidism.  TSH 2/15 was 8.1.    Patient's last menstrual period was 07/14/2001.          Sexually active: yes  The current method of family planning is post menopausal status.    Exercising: no  Smoker:  no  Health Maintenance: Pap:  10/28/12-WNL History of abnormal Pap:  no MMG:  06/22/13-normal Colonoscopy:  2005-repeat in 10 years, scheduled for 7/15.  Scheduled with Dr. Matthias Hughs. BMD:  01/08/11   TDaP:  07/06/07.  Has received Zostavax and pneumonia vaccine. Screening Labs: PCP, Hb today: ? If PCP will do this, Urine today: PCP   reports that she has never smoked. She has never used smokeless tobacco. She reports that she does not drink alcohol or use illicit drugs.  Past Medical History  Diagnosis Date  . Hematuria     evaluation with Dr. Etta Grandchild  . Hypothyroidism 06/2010  . Kidney stone 04/2004  . Vitamin D deficiency 06/2010  . Atrial fibrillation 03/1999   . Lichen sclerosus et atrophicus 2/13    biopsy proven  . Wears glasses     Past Surgical History  Procedure Laterality Date  . Tubal ligation  5/77  . Hysteroscopy w/d&c  8/99  . Colonoscopy  04/2003       . 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 Tonsillectomy    . Dilation and curettage of uterus    . Breast surgery  2010    lt-neg  . Carpal tunnel release Left 08/09/2013    Procedure: LEFT CARPAL TUNNEL RELEASE;  Surgeon: Wyn Forster., MD;  Location: [facility];  Service: Orthopedics;  Laterality: Left;  . Trigger finger release Left 08/09/2013    Procedure: LEFT A1/A2 PULLEY RELEASE;  Surgeon: Wyn Forster., MD;  Location:  SURGERY CENTER;  Service: Orthopedics;  Laterality: Left;    Current Outpatient Prescriptions  Medication Sig Dispense Refill  . Cholecalciferol (VITAMIN D  PO) Take 200 Int'l Units by mouth.      . diltiazem (CARDIZEM CD) 120 MG 24 hr capsule Take 1 capsule (120 mg total) by mouth daily.  90 capsule  3  . ibuprofen (ADVIL,MOTRIN) 200 MG tablet Take 200-400 mg by mouth every 6 (six) hours as needed for pain.      Marland Kitchen levothyroxine (SYNTHROID, LEVOTHROID) 100 MCG tablet Take 100 mcg by mouth daily before breakfast.       No current facility-administered medications for this visit.    Family History  Problem Relation Age of Onset  . Hypertension Mother   . Kidney disease Mother   . Alzheimer's disease Father   . Diabetes Brother   . Hypertension Brother   . Spina bifida Daughter     ROS:  Pertinent items are noted in HPI.  Otherwise, a comprehensive ROS was negative.  Exam:   BP 138/82  Pulse 60  Resp 16  Ht 5' 7.75" (1.721 m)  Wt 318 lb 6.4 oz (144.425 kg)  BMI 48.76 kg/m2  LMP 07/14/2001  Height: 5' 7.75" (172.1 cm)  Ht Readings from Last 3 Encounters:  12/08/13 5' 7.75" (1.721 m)  09/05/13 5\' 8"  (1.727 m)  08/09/13 5\' 8"  (1.727 m)    General appearance: alert, cooperative and appears stated age  Head: Normocephalic, without obvious abnormality, atraumatic Neck: no adenopathy, supple, symmetrical, trachea midline and thyroid normal to inspection and palpation Lungs: clear to auscultation bilaterally Breasts: normal appearance, no masses or tenderness Heart: regular rate and rhythm Abdomen: soft, non-tender; bowel sounds normal; no masses,  no organomegaly Extremities: extremities normal, atraumatic, no cyanosis or edema Skin: Skin color, texture, turgor normal. No rashes or lesions Lymph nodes: Cervical, supraclavicular, and axillary nodes normal. No abnormal inguinal nodes palpated Neurologic: Grossly normal   Pelvic: External genitalia:  no lesions              Urethra:  normal appearing urethra with no masses, tenderness or lesions              Bartholins and Skenes: normal                 Vagina: normal appearing  vagina with normal color and discharge, no lesions              Cervix: no lesions              Pap taken: no Bimanual Exam:  Uterus:  normal size, contour, position, consistency, mobility, non-tender              Adnexa: normal adnexa and no mass, fullness, tenderness               Rectovaginal: Confirms               Anus:  normal sphincter tone, no lesions    A: Well Woman with normal exam  PMP, No HRT  Morbid obesity  Lichen sclerosus, biopsy proven. 2/13.  Area today on exam that is noted in picture is in exact location of prior bipsy. hypothyroidism   P: Mammogram yearly  pap smear only today  Restart clobestasol 0.05% ointment nightly for next 6-8 weeks.  Pt knows to call with any bleeding. Labs with PCP.  An After Visit Summary was printed and given to the patient.

## 2014-03-16 ENCOUNTER — Encounter: Payer: Self-pay | Admitting: *Deleted

## 2014-03-16 ENCOUNTER — Ambulatory Visit (INDEPENDENT_AMBULATORY_CARE_PROVIDER_SITE_OTHER): Payer: Medicare Other | Admitting: Cardiovascular Disease

## 2014-03-16 ENCOUNTER — Encounter: Payer: Self-pay | Admitting: Cardiovascular Disease

## 2014-03-16 VITALS — BP 108/72 | HR 60 | Ht 67.75 in | Wt 309.0 lb

## 2014-03-16 DIAGNOSIS — I4891 Unspecified atrial fibrillation: Secondary | ICD-10-CM

## 2014-03-16 MED ORDER — DILTIAZEM HCL ER COATED BEADS 120 MG PO CP24
120.0000 mg | ORAL_CAPSULE | Freq: Every day | ORAL | Status: DC
Start: 2014-03-16 — End: 2014-07-06

## 2014-03-16 NOTE — Assessment & Plan Note (Signed)
Maint NSR continue ASA and cardizem

## 2014-03-16 NOTE — Assessment & Plan Note (Signed)
Followed by Dr Tenny Craw TSH normalized on higher dose of synthroid

## 2014-03-16 NOTE — Patient Instructions (Signed)
Your physician wants you to follow-up in: YEAR WITH DR NISHAN  You will receive a reminder letter in the mail two months in advance. If you don't receive a letter, please call our office to schedule the follow-up appointment.  Your physician recommends that you continue on your current medications as directed. Please refer to the Current Medication list given to you today. 

## 2014-03-16 NOTE — Progress Notes (Signed)
Patient ID: Sally Wise, female   DOB: [birth year], 68 y.o.   MRN: [id number] 68 year old female with past medical history of hypothyroidism, and atrial fibrillation referred as a recommendation urgent care clinic for atrial fibrillation.7/5 "noted a funny feeling in my chest". This feeling persisted in the morning and prompted her to go to the urgent care clinic. Patient reports having an episode of atrial fibrillation in 2000 which she had an extensive workup for, which she reports that it resolved. She can't describe if this sensation persists at this time. She does report that it is less intense than her last known episode of atrial fibrillation. The sensation does not radiate, there is no known exacerbating or relieving factors. She denies specifically shortness of breath, chest pain, nausea, vomiting, diaphoresis, abdominal pain. She denies being on any rate her rhythm control agents for atrial fibrillation. She reports being anticoagulated for approximately one week with the last episode.  She was started on cardizem and discharged home Feels well . Discussed obesity with her Has had both knees replaced. Starting weight watchers.  Echo 01/31/13 Reviewed  EF 55-60% mild LAE no valve disease  She was started on cardizem and discharged home Feels well No recent echo. Discussed obesity with her Has had both knees replaced. Starting weight watchers.  Carpal Tunnel surgery on left hand a month ago    TSH dose recently increased by Dr Tenny Craw      ROS: Denies fever, malais, weight loss, blurry vision, decreased visual acuity, cough, sputum, SOB, hemoptysis, pleuritic pain, palpitaitons, heartburn, abdominal pain, melena, lower extremity edema, claudication, or rash.  All other systems reviewed and negative  General: Affect appropriate Obese white female HEENT: normal Neck supple with no adenopathy JVP normal no bruits no thyromegaly Lungs clear with no wheezing and good diaphragmatic  motion Heart:  S1/S2 no murmur, no rub, gallop or click PMI normal Abdomen: benighn, BS positve, no tenderness, no AAA no bruit.  No HSM or HJR Distal pulses intact with no bruits No edema Neuro non-focal Skin warm and dry No muscular weakness   Current Outpatient Prescriptions  Medication Sig Dispense Refill  . Cholecalciferol (VITAMIN D PO) Take 200 Int'l Units by mouth.      . clobetasol ointment (TEMOVATE) 0.05 % Apply 1 application topically 2 (two) times daily. Apply nightly for up to 6-8 weeks then prn  60 g  2  . diltiazem (CARDIZEM CD) 120 MG 24 hr capsule Take 1 capsule (120 mg total) by mouth daily.  90 capsule  3  . ibuprofen (ADVIL,MOTRIN) 200 MG tablet Take 200 mg by mouth daily.       Marland Kitchen levothyroxine (SYNTHROID, LEVOTHROID) 100 MCG tablet Take 112 mcg by mouth daily before breakfast.        No current facility-administered medications for this visit.    Allergies  Review of patient's allergies indicates no known allergies.  Electrocardiogram:  SR rate 60 low voltage   Assessment and Plan

## 2014-03-16 NOTE — Assessment & Plan Note (Signed)
Has lost about 30 lbs continue weight watchers contnue exercise program

## 2014-05-15 ENCOUNTER — Encounter: Payer: Self-pay | Admitting: Cardiovascular Disease

## 2014-07-03 ENCOUNTER — Telehealth: Payer: Self-pay | Admitting: Cardiovascular Disease

## 2014-07-03 DIAGNOSIS — I4891 Unspecified atrial fibrillation: Secondary | ICD-10-CM

## 2014-07-03 NOTE — Telephone Encounter (Signed)
New Message  Pt is currently having "symptoms of A-fib, but no CP at this moment. Sent phone call to triage. Please advise.

## 2014-07-03 NOTE — Telephone Encounter (Signed)
Have her see PA flex tomorrow  Needs f/u CBC, TSH and BMET Should be on xarelto 20 mg daily not 15 bid

## 2014-07-03 NOTE — Telephone Encounter (Signed)
SPOKE WITH  PT  PER  PT  DID GO  TO DR ROSS'S OFFICE   AND  HAD  EKG  DONE WHICH  CONFIRMED   IS IN  A FIB  WITH RATE   IN THE 80'S  B/P WAS  120/80   O2  SAT  WAS  IN  THE 90'S PT  COMPLAING FEELS  FUNNY  WITH  BREATHING  NOT LABORED  NOT  SURE HOW TO DESCRIBE  IT    DR  ROSS  DID  START PT ON XARELTO   15 MG    AND  INSTRUCTED  PT  TO TAKE  TWICE  A DAY   ALSO  HAS  APPT   07-20-14  WITTH  DR Eden EmmsNISHAN  WILL FORWARD  TO  DR Eden EmmsNISHAN  FOR REVIEW .  AFTER  RESEARCHING XARELTO   NOT SURE PT  NEEDS  BID  DOSING . CURRENTLY IS  TAKING  CARDIZEM  120 MG  EVERY DAY  AS WELL.

## 2014-07-03 NOTE — Telephone Encounter (Signed)
Patient transferred into triage with c/o heart pounding in her chest. She is unable to check heart rate or blood pressure from home. She denies sob, lightheadedness. She took her cardizem cd 120 mg about 1/2 hour ago.  Pt was advised in the past to call the office to come in if she goes into afib, so she is calling to come in today. Adv patient that she should rest this morning, give the cardizem a chance to work; she may convert with just that. Also adv i will discuss with Wynona Caneshristine, Dr. Fabio BeringNishan's nurse, and one of us will call her back this am, before noon. Instructed pt to call back or go to ED if symptoms worsen or she develops SOB or dizziness. Pt verbalizes understanding and agreement.

## 2014-07-04 ENCOUNTER — Other Ambulatory Visit: Payer: Self-pay | Admitting: *Deleted

## 2014-07-04 ENCOUNTER — Other Ambulatory Visit (INDEPENDENT_AMBULATORY_CARE_PROVIDER_SITE_OTHER): Payer: Medicare Other

## 2014-07-04 DIAGNOSIS — I4891 Unspecified atrial fibrillation: Secondary | ICD-10-CM

## 2014-07-04 LAB — CBC WITH DIFFERENTIAL/PLATELET
Basophils Absolute: 0 10*3/uL (ref 0.0–0.1)
Basophils Relative: 0.3 % (ref 0.0–3.0)
EOS PCT: 0.7 % (ref 0.0–5.0)
Eosinophils Absolute: 0 10*3/uL (ref 0.0–0.7)
HEMATOCRIT: 36.3 % (ref 36.0–46.0)
HEMOGLOBIN: 12 g/dL (ref 12.0–15.0)
LYMPHS ABS: 2.3 10*3/uL (ref 0.7–4.0)
Lymphocytes Relative: 36.4 % (ref 12.0–46.0)
MCHC: 33.1 g/dL (ref 30.0–36.0)
MCV: 86.7 fl (ref 78.0–100.0)
MONO ABS: 0.8 10*3/uL (ref 0.1–1.0)
Monocytes Relative: 12 % (ref 3.0–12.0)
NEUTROS ABS: 3.2 10*3/uL (ref 1.4–7.7)
Neutrophils Relative %: 50.6 % (ref 43.0–77.0)
Platelets: 117 10*3/uL — ABNORMAL LOW (ref 150.0–400.0)
RBC: 4.19 Mil/uL (ref 3.87–5.11)
RDW: 14.8 % (ref 11.5–15.5)
WBC: 6.4 10*3/uL (ref 4.0–10.5)

## 2014-07-04 LAB — BASIC METABOLIC PANEL
BUN: 18 mg/dL (ref 6–23)
CALCIUM: 8.8 mg/dL (ref 8.4–10.5)
CO2: 25 meq/L (ref 19–32)
CREATININE: 0.8 mg/dL (ref 0.4–1.2)
Chloride: 107 mEq/L (ref 96–112)
GFR: 73.14 mL/min (ref >60.00)
GLUCOSE: 133 mg/dL — AB (ref 70–99)
Potassium: 3.8 mEq/L (ref 3.5–5.1)
Sodium: 139 mEq/L (ref 135–145)

## 2014-07-04 LAB — TSH: TSH: 0.94 u[IU]/mL (ref 0.35–4.50)

## 2014-07-04 MED ORDER — RIVAROXABAN 20 MG PO TABS: 20.0000 mg | ORAL_TABLET | Freq: Every day | ORAL | Status: DC

## 2014-07-04 NOTE — Telephone Encounter (Signed)
PT  SEEING  Sally Wise  ON 07-06-14  UNABLE TO COME IN  TODAY   WILL  TRY  AND  DO  LABS  LATER  TODAY   IF  NOT  WILL COME IN TOM   XARELTO SCRIPT  LEFT  AT  FRONT  DESK FOR  FREE  30 DAYS./CY

## 2014-07-06 ENCOUNTER — Ambulatory Visit (INDEPENDENT_AMBULATORY_CARE_PROVIDER_SITE_OTHER): Payer: Medicare Other | Admitting: Physician Assistant

## 2014-07-06 ENCOUNTER — Encounter: Payer: Self-pay | Admitting: Physician Assistant

## 2014-07-06 VITALS — BP 128/80 | HR 67 | Ht 67.75 in | Wt 323.8 lb

## 2014-07-06 DIAGNOSIS — I480 Paroxysmal atrial fibrillation: Secondary | ICD-10-CM | POA: Insufficient documentation

## 2014-07-06 DIAGNOSIS — R739 Hyperglycemia, unspecified: Secondary | ICD-10-CM

## 2014-07-06 DIAGNOSIS — D696 Thrombocytopenia, unspecified: Secondary | ICD-10-CM

## 2014-07-06 DIAGNOSIS — E039 Hypothyroidism, unspecified: Secondary | ICD-10-CM

## 2014-07-06 LAB — HEMOGLOBIN A1C: Hgb A1c MFr Bld: 5.7 % (ref 4.6–6.5)

## 2014-07-06 MED ORDER — DILTIAZEM HCL ER COATED BEADS 180 MG PO CP24: 180.0000 mg | ORAL_CAPSULE | Freq: Every day | ORAL | Status: DC

## 2014-07-06 MED ORDER — DILTIAZEM HCL ER 60 MG PO CP12: 60.0000 mg | ORAL_CAPSULE | Freq: Every day | ORAL | Status: DC | PRN

## 2014-07-06 MED ORDER — RIVAROXABAN 20 MG PO TABS: 20.0000 mg | ORAL_TABLET | Freq: Every day | ORAL | Status: DC

## 2014-07-06 NOTE — Progress Notes (Addendum)
[address] [place], [place]  [address] Phone: [phone] Fax:  [phone]  Date:  07/06/2014   Patient ID:  Sally NorthJanice B Wise, DOB 01/09/1946, MRN [id number]   PCP:   Sally Wise, Alan, MD  Cardiologist:  Sally Wise  History of Present Illness: Sally GrayJanice B Wise is a 68 y.o. female with history of PAF, morbid obesity, hypothyroidism, ?chronic prior thrombocytopenia who presents as an add-on to flex clinic for evaluation of AF. Per notes she has a remote history of AF, previously being treated in the ED. Her prior cardiologist retired from [facility] and she established care with Dr. Eden Wise in 01/2013 after another ED visit for recurrent AF. By time she saw him in clinic she had spontaneously converted on Cardizem. 2D echo 01/2013: mod LVH, EF 55-65%, no RWMA, mild LAE, grade 1 d/d. She recently called the office for recurrent palpitations and was seen by her PCP and found to be back in AF, rate controlled. This episode lasted about an hour. This was her first episode since 01/2013. Her PCP (Dr. Tenny Wise) prescribed Xarelto 15mg  BID and after discussion with our office she was recommended to change to the appropriate dose of 20mg  daily. However, she did not feel she required a blood thinner so she did not start yet. She was asked to f/u today in clinic. She is in NSR today.   She has not had any intercurrent chest pain, SOB, bleeding, or syncope. Recent labs 12/22: normal CBC except plt 117, normal BMET except glu 133, TSH wnl.  Recent Labs: 07/04/2014: Creatinine 0.8; Hemoglobin 12.0; Potassium 3.8; TSH 0.94  Wt Readings from Last 3 Encounters:  07/06/14 323 lb 12.8 oz (146.875 kg)  03/16/14 309 lb (140.161 kg)  12/08/13 318 lb 6.4 oz (144.425 kg)     Past Medical History  Diagnosis Date  . Hematuria     evaluation with Dr. Etta GrandchildSural  . Hypothyroidism 06/2010  . Kidney stone 04/2004  . Vitamin D deficiency 06/2010  . PAF (paroxysmal atrial fibrillation)     a. Remote hx, re-established care  01/2013 with Dr. Eden Wise for recurrence. 2D echo 01/2013: mod LVH, EF 55-65%, no RWMA, grade 1 d/d. b. 06/2014: started on Xarelto.   . Lichen sclerosus et atrophicus 2/13    biopsy proven  . Wears glasses   . Morbid obesity   . Thrombocytopenia     Current Outpatient Prescriptions  Medication Sig Dispense Refill  . Cholecalciferol (VITAMIN D PO) Take 200 Int'l Units by mouth.    Marland Kitchen. ibuprofen (ADVIL,MOTRIN) 200 MG tablet Take 200 mg by mouth daily.     Marland Kitchen. levothyroxine (SYNTHROID, LEVOTHROID) 112 MCG tablet Take 112 mcg by mouth daily before breakfast.    . diltiazem (CARDIZEM CD) 120 MG 24 hr capsule Take 1 capsule (120 mg total) by mouth daily. 90 capsule 3  . rivaroxaban (XARELTO) 20 MG TABS tablet Take 1 tablet (20 mg total) by mouth daily with supper. 90 tablet 3   No current facility-administered medications for this visit.    Allergies:   Review of patient's allergies indicates no known allergies.   Social History:  The patient  reports that she has never smoked. She has never used smokeless tobacco. She reports that she does not drink alcohol or use illicit drugs.   Family History:  The patient's family history includes Alzheimer's disease in her father; Diabetes in her brother; Hypertension in her brother and mother; Kidney disease in her mother; Spina bifida in her  daughter.   ROS:  Please see the history of present illness.   All other systems reviewed and negative.   PHYSICAL EXAM:  VS:  BP 128/80 mmHg  Pulse 67  Ht 5' 7.75" (1.721 m)  Wt 323 lb 12.8 oz (146.875 kg)  BMI 49.59 kg/m2  LMP 07/14/2001 Well nourished, well developed obese WF, in no acute distress HEENT: normal Neck: no JVD, no carotid bruits Cardiac:  normal S1, S2; RRR; no murmur Lungs:  clear to auscultation bilaterally, no wheezing, rhonchi or rales Abd: soft, nontender, no hepatomegaly Ext: no edema Skin: warm and dry Neuro:  moves all extremities spontaneously, no focal abnormalities noted  EKG:   NSR 67bpm, no acute ST-T changes, QTc 414ms    ASSESSMENT AND PLAN:  1. PAF - very infrequent episodes. In the past, Cardizem has worked well to convert her to NSR. Will increase long-acting Cardizem to 180mg  daily and give her short-acting diltiazem 60mg  tablets to take daily PRN palpitations. We had a long discussion regarding anticoagulation. At this time her CHADSVASC = 2 for age, sex and possibly 3 given recent CBG of 133. I agree with PCP starting anticoagulation. Will formally rx Xarelto 20mg  qsupper. She has 30 day free card. Risks/benefits/alternatives discussed and she elected to start Xarelto. I discussed the need for her to observe for bleeding. Will check A1C for complete CHADSVASC assessment and update echocardiogram since it will guide med management in the future. I offered sleep study but she declined. I told her to take ibuprofen only sparingly and use Tylenol when possible instead - she only takes a low dose at night occasionally for shoulder pain.  2. Hypothyroidism - TSH recently normal. 3. Morbid obesity - she declined sleep study. We briefly discussed lifestyle modification in the context of recent elevated blood sugar to include increased physical activity and dietary habits. 4. Thrombocytopenia, possibly chronic - advised her to f/u PCP for further evaluation/monitoring. 5. Hyperglycemia - checking A1C today.  Dispo: Will keep f/u Dr. Eden Wise 07/2014 as scheduled. She is not sure she will need to f/u quite so soon and I told her if she is still feeling well after the new year to [facility]  Signed, Ronie Spiesayna Dunn, PA-C  07/06/2014 8:44 AM

## 2014-07-06 NOTE — Patient Instructions (Addendum)
Increase Diltizem to 180mg  Daily Start Diltizem 60mg  as needed for palpitations and Zarelto 20mg  Daily  Your physician recommends that you return for lab work today   Keep your follow up appointment as scheduled.   Follow up with your PCP for decreased platelet count  Use Ibuprofen sparingly, try to use Tylenol instead when you are able.   Your physician has requested that you have an echocardiogram. Echocardiography is a painless test that uses sound waves to create images of your heart. It provides your doctor with information about the size and shape of your heart and how well your heart's chambers and valves are working. This procedure takes approximately one hour. There are no restrictions for this procedure.

## 2014-07-12 ENCOUNTER — Telehealth: Payer: Self-pay | Admitting: *Deleted

## 2014-07-12 ENCOUNTER — Ambulatory Visit (HOSPITAL_COMMUNITY): Payer: Medicare Other | Attending: Physician Assistant | Admitting: Radiology

## 2014-07-12 DIAGNOSIS — I4891 Unspecified atrial fibrillation: Secondary | ICD-10-CM

## 2014-07-12 DIAGNOSIS — I480 Paroxysmal atrial fibrillation: Secondary | ICD-10-CM

## 2014-07-12 NOTE — Telephone Encounter (Signed)
Pt notified of echo results today with verbal understanding. I advised to keep her appt w/Dr. Eden EmmsNishan since he may d/w her if any particular monitoring needs to be done for the moderate LVH since no h/o htn. Pt said ok and thank you.

## 2014-07-12 NOTE — Progress Notes (Signed)
Echocardiogram performed.  

## 2014-07-19 ENCOUNTER — Encounter: Payer: Self-pay | Admitting: Physician Assistant

## 2014-07-19 DIAGNOSIS — I517 Cardiomegaly: Secondary | ICD-10-CM | POA: Insufficient documentation

## 2014-07-19 NOTE — Progress Notes (Signed)
Patient ID: Sally Wise, female   DOB: 03/20/1946, 69 y.o.   MRN: [id number]   [address] [place], [place]  [address] Phone: [phone] Fax:  [phone]  Date:  07/19/2014   Patient ID:  Sally Wise, DOB 01/25/1946, MRN [id number]   PCP:   Duane Lopeoss, Alan, MD  Cardiologist:  Eden EmmsNishan  History of Present Illness: Sally Wise is a 69 y.o. female with history of PAF, morbid obesity, hypothyroidism, ?chronic prior thrombocytopenia who presents  for evaluation of AF. Per notes she has a remote history of AF, previously being treated in the ED. Her prior cardiologist retired from [facility] and she established care with me in 01/2013 after another ED visit for recurrent AF. By time she saw him in clinic she had spontaneously converted on Cardizem. 2D echo 01/2013: mod LVH, EF 55-65%, no RWMA, mild LAE, grade 1 d/d. She recently called the office for recurrent palpitations and was seen by her PCP and found to be back in AF, rate controlled. This episode lasted about an hour. This was her first episode since 01/2013. Her PCP (Dr. Tenny Crawoss) prescribed Xarelto 15mg  BID and after discussion with our office she was recommended to change to the appropriate dose of 20mg  daily. However, she did not feel she required a blood thinner so she did not start yet.  Saw PA 12/15 and was in NSR   She has not had any intercurrent chest pain, SOB, bleeding, or syncope. Recent labs 12/22: normal CBC except plt 117, normal BMET except glu 133, TSH wnl. Echo 07/12/14 Reviewed Study Conclusions  - Left ventricle: The cavity size was normal. There was moderate concentric hypertrophy. Systolic function was normal. The estimated ejection fraction was in the range of 50% to 55%. Wall motion was normal; there were no regional wall motion abnormalities. There was an increased relative contribution of atrial contraction to ventricular filling. Doppler parameters are consistent with abnormal left ventricular  relaxation (grade 1 diastolic dysfunction). - Pulmonic valve: There was trivial regurgitation.  Long discussion with patient about options  1) continue cardizem and ASA as episodes are infrequent and long term risks of anticoagulation not insignificant 2) Add pill in pocket flecainide for occurences and continue cardizem and ASA 3) Add flecainide and oral anticoagulation  She has had only 2 episodes in last 5 years  One 12/15 and one 7/14  She is convinced she knows when she is in afib  Given this favor option number 2  Recent Labs: 07/04/2014: Creatinine 0.8; Hemoglobin 12.0; Potassium 3.8; TSH 0.94  Wt Readings from Last 3 Encounters:  07/06/14 146.875 kg (323 lb 12.8 oz)  03/16/14 140.161 kg (309 lb)  12/08/13 144.425 kg (318 lb 6.4 oz)     Past Medical History  Diagnosis Date  . Hematuria     evaluation with Dr. Etta GrandchildSural  . Hypothyroidism 06/2010  . Kidney stone 04/2004  . Vitamin D deficiency 06/2010  . PAF (paroxysmal atrial fibrillation)     a. Remote hx, re-established care 01/2013 with Dr. Eden EmmsNishan for recurrence. 2D echo 01/2013: mod LVH, EF 55-65%, no RWMA, grade 1 d/d. b. 06/2014: started on Xarelto.   . Lichen sclerosus et atrophicus 2/13    biopsy proven  . Wears glasses   . Morbid obesity   . Thrombocytopenia   . LVH (left ventricular hypertrophy)     Current Outpatient Prescriptions  Medication Sig Dispense Refill  . Cholecalciferol (VITAMIN D PO) Take 200 Int'l Units by  mouth.    . ibuprofen (ADVIL,MOTRIN) 200 MG tablet Take 200 mg by mouth daily.     Marland Kitchen levothyroxine (SYNTHROID, LEVOTHROID) 112 MCG tablet Take 112 mcg by mouth daily before breakfast.    . diltiazem (CARDIZEM CD) 120 MG 24 hr capsule Take 1 capsule (120 mg total) by mouth daily. 90 capsule 3  . rivaroxaban (XARELTO) 20 MG TABS tablet Take 1 tablet (20 mg total) by mouth daily with supper. 90 tablet 3   No current facility-administered medications for this visit.    Allergies:   Review of  patient's allergies indicates no known allergies.   Social History:  The patient  reports that she has never smoked. She has never used smokeless tobacco. She reports that she does not drink alcohol or use illicit drugs.   Family History:  The patient's family history includes Alzheimer's disease in her father; Diabetes in her brother; Hypertension in her brother and mother; Kidney disease in her mother; Spina bifida in her daughter.   ROS:  Please see the history of present illness.   All other systems reviewed and negative.   PHYSICAL EXAM:  VS:  LMP 07/14/2001 Well nourished, well developed obese WF, in no acute distress HEENT: normal Neck: no JVD, no carotid bruits Cardiac:  normal S1, S2; RRR; no murmur Lungs:  clear to auscultation bilaterally, no wheezing, rhonchi or rales Abd: soft, nontender, no hepatomegaly Ext: no edema Skin: warm and dry Neuro:  moves all extremities spontaneously, no focal abnormalities noted  EKG:  NSR 67bpm, no acute ST-T changes, QTc   07/06/14   ASSESSMENT AND PLAN:  1. PAF - CHADVASC 2 infrequent episodes  Continue cardizme and ASA add pill in pocket flecainide.   2. Hypothyroidism - TSH recently normal. 3. Morbid obesity - she declined sleep study. We briefly discussed lifestyle modification in the context of recent elevated blood sugar to include increased physical activity and dietary habits. 4. Thrombocytopenia, possibly chronic - not a drinker will refer to heme/onc given possible need for anticoagulation in future  5. Hyperglycemia - A1c only 5.7 last visit

## 2014-07-20 ENCOUNTER — Ambulatory Visit (INDEPENDENT_AMBULATORY_CARE_PROVIDER_SITE_OTHER): Payer: Medicare Other | Admitting: Cardiovascular Disease

## 2014-07-20 ENCOUNTER — Encounter: Payer: Self-pay | Admitting: Cardiovascular Disease

## 2014-07-20 VITALS — BP 130/82 | HR 71 | Ht 67.75 in | Wt 315.0 lb

## 2014-07-20 DIAGNOSIS — D696 Thrombocytopenia, unspecified: Secondary | ICD-10-CM

## 2014-07-20 MED ORDER — FLECAINIDE ACETATE 150 MG PO TABS: ORAL_TABLET | ORAL | Status: DC

## 2014-07-20 NOTE — Patient Instructions (Signed)
Your physician wants you to follow-up in:   3 MONTHS WITH  DR Haywood FillerNISHAN You will receive a reminder letter in the mail two months in advance. If you don't receive a letter, please call our office to schedule the follow-up appointment. You have been referred to HEMATOLOGY Your physician has recommended you make the following change in your medication:  STOP  XARELTO TAKE  FLECAINIDE  300 MG   1  TIME FOR  AFIB  EPISODE

## 2014-08-01 ENCOUNTER — Other Ambulatory Visit: Payer: Self-pay | Admitting: Oncology

## 2014-08-01 DIAGNOSIS — D696 Thrombocytopenia, unspecified: Secondary | ICD-10-CM

## 2014-08-04 ENCOUNTER — Ambulatory Visit: Payer: BC Managed Care – PPO | Admitting: Oncology

## 2014-08-04 ENCOUNTER — Other Ambulatory Visit: Payer: BC Managed Care – PPO

## 2014-08-04 ENCOUNTER — Ambulatory Visit: Payer: BC Managed Care – PPO

## 2014-08-07 ENCOUNTER — Telehealth: Payer: Self-pay | Admitting: Oncology

## 2014-08-07 NOTE — Telephone Encounter (Signed)
CALLED PATIENT TO R/S NP APPT FOR 02/16 @ 10:30 W/DR. SHADAD.

## 2014-08-28 ENCOUNTER — Telehealth: Payer: Self-pay | Admitting: Oncology

## 2014-08-28 NOTE — Telephone Encounter (Signed)
Pt called to confirm she is keeping her 02/16 apt NP, spk w/Kim H advising of this confirm apt.

## 2014-08-29 ENCOUNTER — Ambulatory Visit (HOSPITAL_BASED_OUTPATIENT_CLINIC_OR_DEPARTMENT_OTHER): Payer: Medicare Other | Admitting: Oncology

## 2014-08-29 ENCOUNTER — Encounter: Payer: Self-pay | Admitting: Oncology

## 2014-08-29 ENCOUNTER — Other Ambulatory Visit (HOSPITAL_BASED_OUTPATIENT_CLINIC_OR_DEPARTMENT_OTHER): Payer: Medicare Other

## 2014-08-29 ENCOUNTER — Ambulatory Visit: Payer: Medicare Other

## 2014-08-29 VITALS — BP 137/68 | HR 65 | Temp 97.7°F | Resp 18 | Ht 67.75 in | Wt 317.0 lb

## 2014-08-29 DIAGNOSIS — D696 Thrombocytopenia, unspecified: Secondary | ICD-10-CM

## 2014-08-29 DIAGNOSIS — I480 Paroxysmal atrial fibrillation: Secondary | ICD-10-CM

## 2014-08-29 LAB — CBC WITH DIFFERENTIAL/PLATELET
BASO%: 0.3 % (ref 0.0–2.0)
BASOS ABS: 0 10*3/uL (ref 0.0–0.1)
EOS%: 0.6 % (ref 0.0–7.0)
Eosinophils Absolute: 0 10*3/uL (ref 0.0–0.5)
HEMATOCRIT: 40.2 % (ref 34.8–46.6)
HEMOGLOBIN: 12.5 g/dL (ref 11.6–15.9)
LYMPH%: 32.1 % (ref 14.0–49.7)
MCH: 27.2 pg (ref 25.1–34.0)
MCHC: 31.1 g/dL — ABNORMAL LOW (ref 31.5–36.0)
MCV: 87.3 fL (ref 79.5–101.0)
MONO#: 0.9 10*3/uL (ref 0.1–0.9)
MONO%: 18.5 % — ABNORMAL HIGH (ref 0.0–14.0)
NEUT#: 2.4 10*3/uL (ref 1.5–6.5)
NEUT%: 48.5 % (ref 38.4–76.8)
RBC: 4.6 10*6/uL (ref 3.70–5.45)
RDW: 15.3 % — ABNORMAL HIGH (ref 11.2–14.5)
WBC: 5 10*3/uL (ref 3.9–10.3)
lymph#: 1.6 10*3/uL (ref 0.9–3.3)

## 2014-08-29 LAB — CHCC SMEAR

## 2014-08-29 NOTE — Progress Notes (Signed)
Checked in new pt with no financial concerns prior to seeing the dr.  Pt states she's here for a hematology concern so financial assistance may not be needed but she has Raquel's card for any billing questions or concerns.  °

## 2014-08-29 NOTE — Progress Notes (Signed)
Please see consult note.  

## 2014-08-29 NOTE — Consult Note (Signed)
Reason for Referral: Thrombocytopenia.   HPI: 69 year old woman currently of [place] where she lived the majority of her life. She is a retired Runner, broadcasting/film/videoteacher and have been done relatively reasonable health. She has history of paroxysmal atrial fibrillation and follows with cardiology as well as her primary care physician. She was noted on recent test CBC in December 2015 that her platelet count was 117. He was previously anticoagulated on Xarelto but have been recently discontinued from that. She was placed on aspirin instead. She had not reported any bleeding episodes that she is aware of. She had not reported any epistaxis, hematochezia, melena easy bruisability or petechiae. She continues to have excellent performance status without any other symptomatology. Looking at previous counts she have had fluctuating mild thrombocytopenia dating back to 2008. Her platelet count have dropped down as low as 80,000 and recover quite normally. She does not report any headaches, blurry vision, syncope or seizures. She does not report any fevers, chills, sweats, weight loss or appetite changes. She does not report any chest pain, palpitation, orthopnea or leg edema. She does not report any cough, shortness of breath or wheezing. Does not report any nausea, vomiting, abdominal pain. She does not report any frequency, urgency or hesitancy. She does not report any recent hematuria or dysuria. Rest of review of systems unremarkable.   Past Medical History  Diagnosis Date  . Hematuria     evaluation with Dr. Etta GrandchildSural  . Hypothyroidism 06/2010  . Kidney stone 04/2004  . Vitamin D deficiency 06/2010  . PAF (paroxysmal atrial fibrillation)     a. Remote hx, re-established care 01/2013 with Dr. Eden EmmsNishan for recurrence. 2D echo 01/2013: mod LVH, EF 55-65%, no RWMA, grade 1 d/d. b. 06/2014: started on Xarelto.   . Lichen sclerosus et atrophicus 2/13    biopsy proven  . Wears glasses   . Morbid obesity   . Thrombocytopenia   .  LVH (left ventricular hypertrophy)   :  Past Surgical History  Procedure Laterality Date  . Tubal ligation  5/77  . Hysteroscopy w/d&c  8/99  . Colonoscopy  04/2003       . 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 Tonsillectomy    . Dilation and curettage of uterus    . Breast excisional biopsy  2010    left--was a vascular lesion  . Carpal tunnel release Left 08/09/2013    Procedure: LEFT CARPAL TUNNEL RELEASE;  Surgeon: Wyn Forsterobert V Sypher Jr., MD;  Location: [facility];  Service: Orthopedics;  Laterality: Left;  . Trigger finger release Left 08/09/2013    Procedure: LEFT A1/A2 PULLEY RELEASE;  Surgeon: Wyn Forsterobert V Sypher Jr., MD;  Location: [facility];  Service: Orthopedics;  Laterality: Left;  :   Current outpatient prescriptions:  .  aspirin 325 MG tablet, Take 325 mg by mouth daily., Disp: , Rfl:  .  Cholecalciferol (VITAMIN D3) 5000 UNITS TABS, Take 1 tablet by mouth daily., Disp: , Rfl:  .  diltiazem (CARDIZEM CD) 180 MG 24 hr capsule, Take 1 capsule (180 mg total) by mouth daily., Disp: 90 capsule, Rfl: 3 .  flecainide (TAMBOCOR) 150 MG tablet, TAKE  300 MG  1 TIME  FOR  AFIB  EPISODE, Disp: 10 tablet, Rfl: 1 .  levothyroxine (SYNTHROID, LEVOTHROID) 112 MCG tablet, Take 112 mcg by mouth daily before breakfast., Disp: , Rfl: :  No Known Allergies:  Family History  Problem Relation Age of Onset  . Hypertension Mother   .  Kidney disease Mother   . Alzheimer's disease Father   . Diabetes Brother   . Hypertension Brother   . Spina bifida Daughter   :  History   Social History  . Marital Status: Married    Spouse Name: N/A  . Number of Children: N/A  . Years of Education: N/A   Occupational History  . Not on file.   Social History Main Topics  . Smoking status: Never Smoker   . Smokeless tobacco: Never Used  . Alcohol Use: No  . Drug Use: No  . Sexual Activity:    Partners: Male    Birth Control/ Protection: Surgical    Other Topics Concern  . Not on file   Social History Narrative  :  Pertinent items are noted in HPI.  Exam: Blood pressure 137/68, pulse 65, temperature 97.7 F (36.5 C), temperature source Oral, resp. rate 18, height 5' 7.75" (1.721 m), weight 317 lb (143.79 kg), last menstrual period 07/14/2001, SpO2 100 %. General appearance: alert and cooperative Head: Normocephalic, without obvious abnormality Throat: lips, mucosa, and tongue normal; teeth and gums normal Neck: no adenopathy Back: negative, symmetric, no curvature. ROM normal. No CVA tenderness. Resp: clear to auscultation bilaterally Chest wall: no tenderness Cardio: regular rate and rhythm, S1, S2 normal, no murmur, click, rub or gallop GI: soft, non-tender; bowel sounds normal; no masses,  no organomegaly Extremities: extremities normal, atraumatic, no cyanosis or edema Pulses: 2+ and symmetric Skin: Skin color, texture, turgor normal. No rashes or lesions or No petechiae noted.   Recent Labs  08/29/14 1049  WBC 5.0  HGB 12.5  HCT 40.2  PLT 123 Large platelets present*      Blood smear review: Enlarged platelets without any evidence of schistocytosis or red cell fragments.    Assessment and Plan:   69 year old woman with the following issues:  1. Mild thrombocytopenia with a platelet count today of 123,000. Normal range is close to 140. Her platelet counts have fluctuated close to normal range since 2008. Her peripheral smear shows slightly enlarged platelets. Differential diagnosis was discussed today with the patient extensively. Reactive thrombocytopenia is very distinct possibility as well as medication effect. It is unclear to me what could be the precipitating factor but in all likelihood this is a rather mild and benign phenomenon. Other conditions would include immune thrombocytopenia as well as congenital abnormalities such as May-Hegglin anomaly are unlikely.  From a management standpoint, no  further recommendation is needed as her risk of bleeding is negligible at this time. As mentioned, her platelet count is nearly back to normal. The fact that she has fluctuating and nearly normalizing platelet count goes against any malignant hematological condition such as leukemia, lymphoma or even myelodysplastic syndrome.  2. Paroxysmal atrial fibrillation: She has deferred full dose anticoagulation and preferred aspirin. There is no objections to using aspirin or Xarelto if needed to because of her platelet count.  3. Follow-up: I'll be happy to see her in the future as needed.

## 2014-11-09 ENCOUNTER — Ambulatory Visit: Payer: BC Managed Care – PPO | Admitting: Cardiovascular Disease

## 2014-11-30 NOTE — Progress Notes (Signed)
Patient ID: Sally Wise, female   DOB: 05/25/1946, 69 y.o.   MRN: [id number]   [address] [place], [place]  [address] Phone: [phone] Fax:  [phone]  Date:  12/04/2014   Patient ID:  Sally Wise, DOB 10/05/1945, MRN [id number]   PCP:   Duane Lopeoss, Alan, MD  Cardiologist:  Eden EmmsNishan  History of Present Illness: Sally Wise is a 69 y.o. female with history of PAF, morbid obesity, hypothyroidism, ?chronic prior thrombocytopenia who presents  for evaluation of AF. Per notes she has a remote history of AF, previously being treated in the ED. Her prior cardiologist retired from [facility] and she established care with me in 01/2013 after another ED visit for recurrent AF. By time she saw him in clinic she had spontaneously converted on Cardizem. 2D echo 01/2013: mod LVH, EF 55-65%, no RWMA, mild LAE, grade 1 d/d. She recently called the office for recurrent palpitations and was seen by her PCP and found to be back in AF, rate controlled. This episode lasted about an hour. This was her first episode since 01/2013. Her PCP (Dr. Tenny Crawoss) prescribed Xarelto 15mg  BID and after discussion with our office she was recommended to change to the appropriate dose of 20mg  daily. However, she did not feel she required a blood thinner so she did not start yet.  Saw PA 12/15 and was in NSR   She has not had any intercurrent chest pain, SOB, bleeding, or syncope. Recent labs 12/22: normal CBC except plt 117, normal BMET except glu 133, TSH wnl. Echo 07/12/14 Reviewed Study Conclusions  - Left ventricle: The cavity size was normal. There was moderate concentric hypertrophy. Systolic function was normal. The estimated ejection fraction was in the range of 50% to 55%. Wall motion was normal; there were no regional wall motion abnormalities. There was an increased relative contribution of atrial contraction to ventricular filling. Doppler parameters are consistent with abnormal left ventricular  relaxation (grade 1 diastolic dysfunction). - Pulmonic valve: There was trivial regurgitation.  Long discussion with patient about options  1) continue cardizem and ASA as episodes are infrequent and long term risks of anticoagulation not insignificant 2) Add pill in pocket flecainide for occurences and continue cardizem and ASA 3) Add flecainide and oral anticoagulation  She has had only 2 episodes in last 5 years  One 12/15 and one 7/14  She is convinced she knows when she is in afib  Given this favor option number 2 She has some bitterness about her daughters spina bifida and inability to get a good job opportunity   Recent Labs: 07/04/2014: Creatinine 0.8; Potassium 3.8; TSH 0.94 08/29/2014: Hemoglobin 12.5  Wt Readings from Last 3 Encounters:  12/04/14 139.617 kg (307 lb 12.8 oz)  08/29/14 143.79 kg (317 lb)  07/20/14 142.883 kg (315 lb)     Past Medical History  Diagnosis Date  . Hematuria     evaluation with Dr. Etta GrandchildSural  . Hypothyroidism 06/2010  . Kidney stone 04/2004  . Vitamin D deficiency 06/2010  . PAF (paroxysmal atrial fibrillation)     a. Remote hx, re-established care 01/2013 with Dr. Eden EmmsNishan for recurrence. 2D echo 01/2013: mod LVH, EF 55-65%, no RWMA, grade 1 d/d. b. 06/2014: started on Xarelto.   . Lichen sclerosus et atrophicus 2/13    biopsy proven  . Wears glasses   . Morbid obesity   . Thrombocytopenia   . LVH (left ventricular hypertrophy)     Current Outpatient  Prescriptions  Medication Sig Dispense Refill  . Cholecalciferol (VITAMIN D PO) Take 200 Int'l Units by mouth.    Marland Kitchen. ibuprofen (ADVIL,MOTRIN) 200 MG tablet Take 200 mg by mouth daily.     Marland Kitchen. levothyroxine (SYNTHROID, LEVOTHROID) 112 MCG tablet Take 112 mcg by mouth daily before breakfast.    . diltiazem (CARDIZEM CD) 120 MG 24 hr capsule Take 1 capsule (120 mg total) by mouth daily. 90 capsule 3         No current facility-administered medications for this visit.    Allergies:   Review of  patient's allergies indicates no known allergies.   Social History:  The patient  reports that she has never smoked. She has never used smokeless tobacco. She reports that she does not drink alcohol or use illicit drugs.   Family History:  The patient's family history includes Alzheimer's disease in her father; Diabetes in her brother; Hypertension in her brother and mother; Kidney disease in her mother; Spina bifida in her daughter.   ROS:  Please see the history of present illness.   All other systems reviewed and negative.   PHYSICAL EXAM:  VS:  BP 130/70 mmHg  Pulse 65  Ht 5\' 8"  (1.727 m)  Wt 139.617 kg (307 lb 12.8 oz)  BMI 46.81 kg/m2  LMP 07/14/2001 Well nourished, well developed obese WF, in no acute distress HEENT: normal Neck: no JVD, no carotid bruits Cardiac:  normal S1, S2; RRR; no murmur Lungs:  clear to auscultation bilaterally, no wheezing, rhonchi or rales Abd: soft, nontender, no hepatomegaly Ext: no edema Skin: warm and dry Neuro:  moves all extremities spontaneously, no focal abnormalities noted  EKG:  NSR 67bpm, no acute ST-T changes, QTc 414ms   07/06/14   ASSESSMENT AND PLAN:  1. PAF - CHADVASC 2 infrequent episodes  Continue cardizme and ASA add pill in pocket flecainide.   2. Hypothyroidism - TSH recently normal. 3. Morbid obesity - she declined sleep study. We briefly discussed lifestyle modification in the context of recent elevated blood sugar to include increased physical activity and dietary habits. 4. Thrombocytopenia, possibly chronic - not a drinker seen by Heme and no further w/u deemed necessary 5. Hyperglycemia - A1c only 5.7 last visit   F/u with me in a year

## 2014-12-04 ENCOUNTER — Ambulatory Visit (INDEPENDENT_AMBULATORY_CARE_PROVIDER_SITE_OTHER): Payer: Medicare Other | Admitting: Cardiovascular Disease

## 2014-12-04 ENCOUNTER — Encounter: Payer: Self-pay | Admitting: Cardiovascular Disease

## 2014-12-04 VITALS — BP 130/70 | HR 65 | Ht 68.0 in | Wt 307.8 lb

## 2014-12-04 DIAGNOSIS — I480 Paroxysmal atrial fibrillation: Secondary | ICD-10-CM | POA: Diagnosis not present

## 2014-12-04 NOTE — Patient Instructions (Signed)
Medication Instructions:  NO CHANGES  Labwork: NONE  Testing/Procedures: NONE  Follow-Up: Your physician wants you to follow-up in: YEAR WITH  DR NISHAN You will receive a reminder letter in the mail two months in advance. If you don't receive a letter, please call our office to schedule the follow-up appointment.   Any Other Special Instructions Will Be Listed Below (If Applicable).   

## 2015-01-04 ENCOUNTER — Encounter: Payer: Self-pay | Admitting: Obstetrics & Gynecology

## 2015-01-04 ENCOUNTER — Ambulatory Visit (INDEPENDENT_AMBULATORY_CARE_PROVIDER_SITE_OTHER): Payer: Medicare Other | Admitting: Obstetrics & Gynecology

## 2015-01-04 VITALS — BP 124/78 | HR 80 | Resp 16 | Ht 67.5 in | Wt 302.0 lb

## 2015-01-04 DIAGNOSIS — L900 Lichen sclerosus et atrophicus: Secondary | ICD-10-CM | POA: Diagnosis not present

## 2015-01-04 DIAGNOSIS — Z124 Encounter for screening for malignant neoplasm of cervix: Secondary | ICD-10-CM

## 2015-01-04 DIAGNOSIS — Z01419 Encounter for gynecological examination (general) (routine) without abnormal findings: Secondary | ICD-10-CM | POA: Diagnosis not present

## 2015-01-04 MED ORDER — CLOBETASOL PROPIONATE 0.05 % EX OINT: 1.0000 "application " | TOPICAL_OINTMENT | Freq: Two times a day (BID) | CUTANEOUS | Status: DC

## 2015-01-04 NOTE — Progress Notes (Signed)
Patient ID: Sally Wise, female   DOB: 03-25-1946, 69 y.o.   MRN: [id number]   69 y.o. G2P2 MarriedCaucasianF here for annual exam.  Has seen Dr. Eden Emms in the last two months.  Platelet count was 117 and she was sent to hem/onc and saw Dr. Clelia Croft.  He recommended she so nothing.  Had a blood smear then.  Pt is not on any anti-coagulation except for her ASA.  Reports no significant vulvar itching over last year.  No vaginal/vulvar bleeding.  PCP:  Dr. Tenny Craw.  Has appt Monday.  Did blood work yesterday.    Patient's last menstrual period was 07/14/2001.          Sexually active: Yes.    The current method of family planning is post menopausal status.    Exercising: Yes.    bicycle  Smoker:  no  Health Maintenance: Pap:  10-28-12 WNL  History of abnormal Pap:  no MMG:  07-17-14 WNL Bi Rads 1  Colonoscopy:  02-06-14 polyps repeat in 3-5 yrs  BMD:   01-08-11 WNL  TDaP:  07-06-07 Screening Labs: PCP does labs    reports that she has never smoked. She has never used smokeless tobacco. She reports that she does not drink alcohol or use illicit drugs.  Past Medical History  Diagnosis Date  . Hematuria     evaluation with Dr. Etta Grandchild  . Hypothyroidism 06/2010  . Kidney stone 04/2004  . Vitamin D deficiency 06/2010  . PAF (paroxysmal atrial fibrillation)     a. Remote hx, re-established care 01/2013 with Dr. Eden Emms for recurrence. 2D echo 01/2013: mod LVH, EF 55-65%, no RWMA, grade 1 d/d. b. 06/2014: started on Xarelto.   . Lichen sclerosus et atrophicus 2/13    biopsy proven  . Wears glasses   . Morbid obesity   . Thrombocytopenia   . LVH (left ventricular hypertrophy)     Past Surgical History  Procedure Laterality Date  . Tubal ligation  5/77  . Hysteroscopy w/d&c  8/99  . Colonoscopy  04/2003       . 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 Tonsillectomy    . Dilation and curettage of uterus    . Breast excisional biopsy  2010    left--was a vascular lesion  . Carpal  tunnel release Left 08/09/2013    Procedure: LEFT CARPAL TUNNEL RELEASE;  Surgeon: Wyn Forster., MD;  Location: [facility];  Service: Orthopedics;  Laterality: Left;  . Trigger finger release Left 08/09/2013    Procedure: LEFT A1/A2 PULLEY RELEASE;  Surgeon: Wyn Forster., MD;  Location: [facility];  Service: Orthopedics;  Laterality: Left;    Current Outpatient Prescriptions  Medication Sig Dispense Refill  . aspirin 325 MG tablet Take 325 mg by mouth daily.    . Cholecalciferol (VITAMIN D3) 5000 UNITS TABS Take 1 tablet by mouth daily.    Marland Kitchen diltiazem (CARDIZEM CD) 180 MG 24 hr capsule Take 1 capsule (180 mg total) by mouth daily. 90 capsule 3  . flecainide (TAMBOCOR) 150 MG tablet TAKE  300 MG  1 TIME  FOR  AFIB  EPISODE 10 tablet 1  . levothyroxine (SYNTHROID, LEVOTHROID) 112 MCG tablet Take 112 mcg by mouth daily before breakfast.     No current facility-administered medications for this visit.    Family History  Problem Relation Age of Onset  . Hypertension Mother   . Kidney disease Mother   .  Alzheimer's disease Father   . Diabetes Brother   . Hypertension Brother   . Spina bifida Daughter     ROS:  Pertinent items are noted in HPI.  Otherwise, a comprehensive ROS was negative.  Exam:   General appearance: alert, cooperative and appears stated age Head: Normocephalic, without obvious abnormality, atraumatic Neck: no adenopathy, supple, symmetrical, trachea midline and thyroid normal to inspection and palpation Lungs: clear to auscultation bilaterally Breasts: normal appearance, no masses or tenderness Heart: regular rate and rhythm Abdomen: soft, non-tender; bowel sounds normal; no masses,  no organomegaly Extremities: extremities normal, atraumatic, no cyanosis or edema Skin: Skin color, texture, turgor normal. No rashes or lesions Lymph nodes: Cervical, supraclavicular, and axillary nodes normal. No abnormal inguinal nodes  palpated Neurologic: Grossly normal   Pelvic: External genitalia: thickened white tissue on superior labia minora.  No change from last year.              Urethra:  normal appearing urethra with no masses, tenderness or lesions              Bartholins and Skenes: normal                 Vagina: normal appearing vagina with normal color and discharge, no lesions              Cervix: no lesions              Pap taken: Yes.   Bimanual Exam:  Uterus:  normal size, contour, position, consistency, mobility, non-tender              Adnexa: normal adnexa and no mass, fullness, tenderness               Rectovaginal: Confirms               Anus:  normal sphincter tone, no lesions   A:  Well Woman with normal exam  PMP, No HRT  Morbid obesity  Lichen sclerosus, biopsy proven. 2/13. no change on physical exam today. hypothyroidism  A fib Mildly low platelet ct  P: Mammogram yearly  pap smear only today  Rx for clobestasol 0.05% ointment up to BID x 14 days given. Pt knows to call if have any vulvar bleeding or increased itching.   Labs with PCP.

## 2015-01-06 LAB — IPS PAP SMEAR ONLY

## 2015-06-21 ENCOUNTER — Other Ambulatory Visit: Payer: Self-pay | Admitting: Physician Assistant

## 2015-06-22 ENCOUNTER — Other Ambulatory Visit: Payer: Self-pay | Admitting: *Deleted

## 2015-06-22 MED ORDER — DILTIAZEM HCL ER COATED BEADS 180 MG PO CP24: 180.0000 mg | ORAL_CAPSULE | Freq: Every day | ORAL | Status: DC

## 2015-06-22 NOTE — Telephone Encounter (Signed)
Should the patient be taking 120mg  or 180mg ? Please advise. Thanks, MI

## 2015-08-06 NOTE — Progress Notes (Signed)
Patient ID: Sally Wise, female   DOB: 03-Oct-1945, 70 y.o.   MRN: [id number]   [address] [place], [place]  [address] Phone: [phone] Fax:  [phone]  Date:  08/07/2015   Patient ID:  Sally, Wise 1945/08/05, MRN [id number]   PCP:   Duane Lope, MD  Cardiologist:  Eden Emms  History of Present Illness: [name] is a 70 y.o. female with history of PAF, morbid obesity, hypothyroidism, ?chronic prior thrombocytopenia who presents  for evaluation of AF. Per notes she has a remote history of AF, previously being treated in the ED. Her prior cardiologist retired from [facility] and she established care with me in 01/2013 after another ED visit for recurrent AF. By time she saw him in clinic she had spontaneously converted on Cardizem. 2D echo 01/2013: mod LVH, EF 55-65%, no RWMA, mild LAE, grade 1 d/d. She recently called the office for recurrent palpitations and was seen by her PCP and found to be back in AF, rate controlled. This episode lasted about an hour. This was her first episode since 01/2013. Her PCP (Dr. Tenny Craw) prescribed Xarelto  BID and after discussion with our office she was recommended to change to the appropriate dose of  daily. However, she did not feel she required a blood thinner so she did not start yet.  Saw PA 12/15 and was in NSR   She has not had any intercurrent   SOB, bleeding, or syncope. Recent labs 12/22: normal CBC except plt 117, normal BMET except glu 133, TSH wnl. Echo 07/12/14 Reviewed Study Conclusions  - Left ventricle: The cavity size was normal. There was moderate concentric hypertrophy. Systolic function was normal. The estimated ejection fraction was in the range of 50% to 55%. Wall motion was normal; there were no regional wall motion abnormalities. There was an increased relative contribution of atrial contraction to ventricular filling. Doppler parameters are consistent with abnormal left ventricular  relaxation (grade 1 diastolic dysfunction). - Pulmonic valve: There was trivial regurgitation.  Long discussion with patient about options  1) continue cardizem and ASA as episodes are infrequent and long term risks of anticoagulation not insignificant 2) Add pill in pocket flecainide for occurences and continue cardizem and ASA 3) Add flecainide and oral anticoagulation  She has had only 2 episodes in last 5 years  One 12/15 and one 7/14  She is convinced she knows when she is in afib  Given this favor option number 2 She has some bitterness about her daughters spina bifida and inability to get a good job opportunity   Past week with atypical chest, back and epigastric pain. Not exertional Waxes wanes all day partial relief with advil.  [facility] normal ECG and CXR.    Recent Labs: 08/29/2014: HGB 12.5  Wt Readings from Last 3 Encounters:  08/07/15 141.069 kg (311 lb)  01/04/15 136.986 kg (302 lb)  12/04/14 139.617 kg (307 lb 12.8 oz)     Past Medical History  Diagnosis Date  . Hematuria     evaluation with Dr. Etta Grandchild  . Hypothyroidism 06/2010  . Kidney stone 04/2004  . Vitamin D deficiency 06/2010  . PAF (paroxysmal atrial fibrillation) (HCC)     a. Remote hx, re-established care 01/2013 with Dr. Eden Emms for recurrence. 2D echo 01/2013: mod LVH, EF 55-65%, no RWMA, grade 1 d/d. b. 06/2014: started on Xarelto.   . Lichen sclerosus et atrophicus 2/13    biopsy proven  . Wears  glasses   . Morbid obesity (HCC)   . Thrombocytopenia (HCC)   . LVH (left ventricular hypertrophy)     Current Outpatient Prescriptions  Medication Sig Dispense Refill  . Cholecalciferol (VITAMIN D PO) Take 200 Int'l Units by mouth.    Marland Kitchen ibuprofen (ADVIL,MOTRIN) 200 MG tablet Take 200 mg by mouth daily.     Marland Kitchen levothyroxine (SYNTHROID, LEVOTHROID) 112 MCG tablet Take 112 mcg by mouth daily before breakfast.    . diltiazem (CARDIZEM CD) 120 MG 24 hr capsule Take 1 capsule (120 mg total) by mouth daily.  90 capsule 3         No current facility-administered medications for this visit.    Allergies:   Review of patient's allergies indicates no known allergies.   Social History:  The patient  reports that she has never smoked. She has never used smokeless tobacco. She reports that she does not drink alcohol or use illicit drugs.   Family History:  The patient's family history includes Alzheimer's disease in her father; Diabetes in her brother; Hypertension in her brother and mother; Kidney disease in her mother; Spina bifida in her daughter.   ROS:  Please see the history of present illness.   All other systems reviewed and negative.   PHYSICAL EXAM:  VS:  BP 128/84 mmHg  Pulse 68  Ht  (1.727 m)  Wt 141.069 kg (311 lb)  BMI 47.30 kg/m2  LMP 07/14/2001 Well nourished, well developed obese WF, in no acute distress HEENT: normal Neck: no JVD, no carotid bruits Cardiac:  normal S1, S2; RRR; no murmur Lungs:  clear to auscultation bilaterally, no wheezing, rhonchi or rales Abd: soft, nontender, no hepatomegaly Ext: no edema Skin: warm and dry Neuro:  moves all extremities spontaneously, no focal abnormalities noted  EKG:  NSR 67bpm, no acute ST-T changes, QTc   07/06/14   ASSESSMENT AND PLAN:  1. PAF - CHADVASC 2 infrequent episodes  Continue cardizem and ASA add pill in pocket flecainide.   2. Hypothyroidism - TSH recently normal. 3. Morbid obesity - she declined sleep study. We briefly discussed lifestyle modification in the context of recent elevated blood sugar to include increased physical activity and dietary habits. 4. Thrombocytopenia, possibly chronic - not a drinker seen by Heme and no further w/u deemed necessary 5. Hyperglycemia - A1c only 5.7 last visit  6. Chest pain atypical f/u rest stress myovue will do resting images today.    F/u with me in 6 months if myovue normal   Charlton Haws

## 2015-08-07 ENCOUNTER — Encounter: Payer: Self-pay | Admitting: Cardiovascular Disease

## 2015-08-07 ENCOUNTER — Ambulatory Visit (INDEPENDENT_AMBULATORY_CARE_PROVIDER_SITE_OTHER): Payer: Medicare Other | Admitting: Cardiovascular Disease

## 2015-08-07 ENCOUNTER — Ambulatory Visit (HOSPITAL_COMMUNITY): Payer: Medicare Other | Attending: Cardiovascular Disease

## 2015-08-07 ENCOUNTER — Encounter: Payer: Self-pay | Admitting: *Deleted

## 2015-08-07 VITALS — BP 128/84 | HR 68 | Ht 68.0 in | Wt 311.0 lb

## 2015-08-07 DIAGNOSIS — I480 Paroxysmal atrial fibrillation: Secondary | ICD-10-CM

## 2015-08-07 DIAGNOSIS — R002 Palpitations: Secondary | ICD-10-CM | POA: Insufficient documentation

## 2015-08-07 DIAGNOSIS — R079 Chest pain, unspecified: Secondary | ICD-10-CM

## 2015-08-07 MED ORDER — TECHNETIUM TC 99M SESTAMIBI GENERIC - CARDIOLITE
31.6000 | Freq: Once | INTRAVENOUS | Status: AC | PRN
  Administered 2015-08-07: 32 via INTRAVENOUS

## 2015-08-07 NOTE — Patient Instructions (Addendum)
Medication Instructions:  Your physician recommends that you continue on your current medications as directed. Please refer to the Current Medication list given to you today.  Labwork: None   Testing/Procedures: Your physician has requested that you have a lexiscan myoview. For further information please visit [email or web address]. Please follow instruction sheet, as given.    Follow-Up: Your physician wants you to follow-up in: 6 months with Dr Nishan. (July 2017).  You will receive a reminder letter in the mail two months in advance. If you don't receive a letter, please call our office to schedule the follow-up appointment.       If you need a refill on your cardiac medications before your next appointment, please call your pharmacy.   

## 2015-08-08 ENCOUNTER — Ambulatory Visit (HOSPITAL_COMMUNITY): Payer: Medicare Other | Attending: Internal Medicine

## 2015-08-08 DIAGNOSIS — R079 Chest pain, unspecified: Secondary | ICD-10-CM | POA: Diagnosis not present

## 2015-08-08 LAB — MYOCARDIAL PERFUSION IMAGING
CHL CUP NUCLEAR SSS: 20
CSEPPHR: 79 {beats}/min
LVDIAVOL: 99 mL
LVSYSVOL: 27 mL
RATE: 0.3
Rest HR: 60 {beats}/min
SDS: 8
SRS: 12
TID: 0.8

## 2015-08-08 MED ORDER — REGADENOSON 0.4 MG/5ML IV SOLN
0.4000 mg | Freq: Once | INTRAVENOUS | Status: AC
  Administered 2015-08-08: 0.4 mg via INTRAVENOUS

## 2015-08-08 MED ORDER — TECHNETIUM TC 99M SESTAMIBI GENERIC - CARDIOLITE
33.0000 | Freq: Once | INTRAVENOUS | Status: AC | PRN
  Administered 2015-08-08: 33 via INTRAVENOUS

## 2015-09-15 DIAGNOSIS — Z1231 Encounter for screening mammogram for malignant neoplasm of breast: Secondary | ICD-10-CM | POA: Diagnosis not present

## 2015-10-31 DIAGNOSIS — J111 Influenza due to unidentified influenza virus with other respiratory manifestations: Secondary | ICD-10-CM | POA: Diagnosis not present

## 2015-12-20 DIAGNOSIS — Z961 Presence of intraocular lens: Secondary | ICD-10-CM | POA: Diagnosis not present

## 2015-12-20 DIAGNOSIS — H26492 Other secondary cataract, left eye: Secondary | ICD-10-CM | POA: Diagnosis not present

## 2016-01-21 DIAGNOSIS — D696 Thrombocytopenia, unspecified: Secondary | ICD-10-CM | POA: Diagnosis not present

## 2016-01-21 DIAGNOSIS — E7800 Pure hypercholesterolemia, unspecified: Secondary | ICD-10-CM | POA: Diagnosis not present

## 2016-01-21 DIAGNOSIS — Z79899 Other long term (current) drug therapy: Secondary | ICD-10-CM | POA: Diagnosis not present

## 2016-01-21 DIAGNOSIS — E039 Hypothyroidism, unspecified: Secondary | ICD-10-CM | POA: Diagnosis not present

## 2016-01-21 DIAGNOSIS — E559 Vitamin D deficiency, unspecified: Secondary | ICD-10-CM | POA: Diagnosis not present

## 2016-01-24 DIAGNOSIS — D696 Thrombocytopenia, unspecified: Secondary | ICD-10-CM | POA: Diagnosis not present

## 2016-01-24 DIAGNOSIS — Z6841 Body Mass Index (BMI) 40.0 and over, adult: Secondary | ICD-10-CM | POA: Diagnosis not present

## 2016-01-24 DIAGNOSIS — E039 Hypothyroidism, unspecified: Secondary | ICD-10-CM | POA: Diagnosis not present

## 2016-01-24 DIAGNOSIS — Z0000 Encounter for general adult medical examination without abnormal findings: Secondary | ICD-10-CM | POA: Diagnosis not present

## 2016-02-27 ENCOUNTER — Ambulatory Visit: Payer: Medicare Other | Admitting: Nurse Practitioner

## 2016-03-12 DIAGNOSIS — Z961 Presence of intraocular lens: Secondary | ICD-10-CM | POA: Diagnosis not present

## 2016-03-12 DIAGNOSIS — H43311 Vitreous membranes and strands, right eye: Secondary | ICD-10-CM | POA: Diagnosis not present

## 2016-03-28 ENCOUNTER — Encounter: Payer: Self-pay | Admitting: Cardiovascular Disease

## 2016-04-08 ENCOUNTER — Ambulatory Visit (INDEPENDENT_AMBULATORY_CARE_PROVIDER_SITE_OTHER): Payer: Medicare Other | Admitting: Nurse Practitioner

## 2016-04-08 ENCOUNTER — Encounter: Payer: Self-pay | Admitting: Nurse Practitioner

## 2016-04-08 VITALS — BP 140/80 | HR 76 | Resp 16 | Ht 67.25 in | Wt 314.4 lb

## 2016-04-08 DIAGNOSIS — L900 Lichen sclerosus et atrophicus: Secondary | ICD-10-CM | POA: Diagnosis not present

## 2016-04-08 DIAGNOSIS — R319 Hematuria, unspecified: Secondary | ICD-10-CM

## 2016-04-08 LAB — POCT URINALYSIS DIPSTICK
Bilirubin, UA: NEGATIVE
Glucose, UA: NEGATIVE
Ketones, UA: NEGATIVE
Nitrite, UA: NEGATIVE
Urobilinogen, UA: NEGATIVE
pH, UA: 6

## 2016-04-08 MED ORDER — CIPROFLOXACIN HCL 500 MG PO TABS: 500.0000 mg | ORAL_TABLET | Freq: Two times a day (BID) | ORAL | 0 refills | Status: DC

## 2016-04-08 MED ORDER — CLOBETASOL PROPIONATE 0.05 % EX OINT: 1.0000 "application " | TOPICAL_OINTMENT | Freq: Two times a day (BID) | CUTANEOUS | 3 refills | Status: DC

## 2016-04-08 NOTE — Patient Instructions (Signed)

## 2016-04-08 NOTE — Progress Notes (Signed)
Patient ID: Sally GrayJanice B Wise, female   DOB: 11/23/1945, 70 y.o.   MRN: [id number]  70 y.o. [id number] Married  Caucasian Fe here for follow up on LSA.  She has been out of Temovate for some time and using OTC Bath and Body lotion.  This has caused an increase of dryness rather than moisture.  She is having some urinary symptoms as well.  She has noted increase in nocturia, frequency for about 2 weeks.  Yesterday she did take a dose of Amoxil for dental prophylaxis, so maybe a little better today.  Denies fever and chills.  Patient's last menstrual period was 07/14/2001.          Sexually active: No.  The current method of family planning is post menopausal status.    Exercising: No.  The patient does not participate in regular exercise at present. Smoker:  no  Health Maintenance: Pap: 01/04/15, Negative History of abnormal Pap:  no MMG: 09/15/15, 3D, Bi-Rads 1: Negative  Colonoscopy: 02/06/14, polyps repeat in 3-5 yrs  BMD: 01/08/11, T Score, 1.4 Right Radius / 0.6 Left Radius, hips not measured TDaP:  07/06/07 Shingles: 2005 Pneumonia: Not sure Hep C: PCP Labs: PCP Urine: 2+RBC, small leuk's, trace protein   reports that she has never smoked. She has never used smokeless tobacco. She reports that she does not drink alcohol or use drugs.  Past Medical History:  Diagnosis Date  . Hematuria    evaluation with Dr. Etta GrandchildSural  . Hypothyroidism 06/2010  . Kidney stone 04/2004  . Lichen sclerosus et atrophicus 2/13   biopsy proven  . LVH (left ventricular hypertrophy)   . Morbid obesity (HCC)   . PAF (paroxysmal atrial fibrillation) (HCC)    a. Remote hx, re-established care 01/2013 with Dr. Eden EmmsNishan for recurrence. 2D echo 01/2013: mod LVH, EF 55-65%, no RWMA, grade 1 d/d. b. 06/2014: started on Xarelto.   . Thrombocytopenia (HCC)   . Vitamin D deficiency 06/2010  . Wears glasses     Past Surgical History:  Procedure Laterality Date  . BREAST EXCISIONAL BIOPSY  2010   left--was a vascular lesion   . CARPAL TUNNEL RELEASE Left 08/09/2013   Procedure: LEFT CARPAL TUNNEL RELEASE;  Surgeon: Wyn Forsterobert V Sypher Jr., MD;  Location: [facility];  Service: Orthopedics;  Laterality: Left;  . CATARACT EXTRACTION    . COLONOSCOPY  04/2003      . DILATION AND CURETTAGE OF UTERUS    . HYSTEROSCOPY W/D&C  8/99  . 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 TONSILLECTOMY    . TRIGGER FINGER RELEASE Left 08/09/2013   Procedure: LEFT A1/A2 PULLEY RELEASE;  Surgeon: Wyn Forsterobert V Sypher Jr., MD;  Location: [facility];  Service: Orthopedics;  Laterality: Left;  . TUBAL LIGATION  5/77    Current Outpatient Prescriptions  Medication Sig Dispense Refill  . aspirin 325 MG tablet Take 325 mg by mouth daily.    . Cholecalciferol (VITAMIN D3) 5000 UNITS TABS Take 1 tablet by mouth daily.    . clobetasol ointment (TEMOVATE) 0.05 % Apply 1 application topically 2 (two) times daily. Apply as directed twice daily 60 g 1  . diltiazem (CARDIZEM CD) 180 MG 24 hr capsule Take 1 capsule (180 mg total) by mouth daily. 90 capsule 3  . flecainide (TAMBOCOR) 150 MG tablet TAKE  300 MG  1 TIME  FOR  AFIB  EPISODE 10 tablet 1  . levothyroxine (SYNTHROID, LEVOTHROID) 112 MCG tablet Take  112 mcg by mouth daily before breakfast.    . Multiple Vitamins-Minerals (CENTRUM SILVER ULTRA WOMENS PO) Take 1 tablet by mouth daily.     No current facility-administered medications for this visit.     Family History  Problem Relation Age of Onset  . Hypertension Mother   . Kidney disease Mother   . Alzheimer's disease Father   . Diabetes Brother   . Hypertension Brother   . Spina bifida Daughter     ROS:  Pertinent items are noted in HPI.  Otherwise, a comprehensive ROS was negative.  Exam:   BP 140/80 (BP Location: Right Arm, Patient Position: Sitting, Cuff Size: Large)   Pulse 76   Resp 16   Ht 5' 7.25" (1.708 m)   Wt (!) 314 lb 6.4 oz (142.6 kg)   LMP 07/14/2001   BMI 48.88 kg/m  Height:  5' 7.25" (170.8 cm) Ht Readings from Last 3 Encounters:  04/08/16 5' 7.25" (1.708 m)  08/07/15 5\' 8"  (1.727 m)  08/07/15 5\' 8"  (1.727 m)    General appearance: alert, cooperative and appears stated age Head: Normocephalic, without obvious abnormality, atraumatic Heart: regular rate and rhythm Abdomen: soft, non-tender; no masses,  no organomegaly Neurologic: Grossly normal   Pelvic: External genitalia: multiple places of LSA with flare to the perianal areas              Urethra:  normal appearing urethra with no masses, tenderness or lesions              Bartholin's and Skene's: normal                 Vagina: normal appearing vagina with normal color and discharge, no lesions with a prolapse of uterus and bladder                Chaperone present: no  A:  History of LSA with flare  R/O UTI  P:   Reviewed health and wellness pertinent to exam  Refill on Temovate to use as directed for a flare  Will use coconut oil to the labia daily after this flare for comfort and with SA  Briefly discussed pessary use but she was not interested  Will start on Cipro 500 mg BID # 14 and call with urine C&S results  An After Visit Summary was printed and given to the patient.

## 2016-04-09 LAB — URINE CULTURE

## 2016-04-10 NOTE — Progress Notes (Signed)
Encounter reviewed by Dr. Brook Amundson C. Silva.  

## 2016-04-11 ENCOUNTER — Encounter (INDEPENDENT_AMBULATORY_CARE_PROVIDER_SITE_OTHER): Payer: Self-pay

## 2016-04-11 ENCOUNTER — Ambulatory Visit (INDEPENDENT_AMBULATORY_CARE_PROVIDER_SITE_OTHER): Payer: Medicare Other | Admitting: Cardiovascular Disease

## 2016-04-11 ENCOUNTER — Encounter: Payer: Self-pay | Admitting: Cardiovascular Disease

## 2016-04-11 VITALS — BP 138/86 | HR 64 | Ht 67.0 in | Wt 315.0 lb

## 2016-04-11 DIAGNOSIS — I480 Paroxysmal atrial fibrillation: Secondary | ICD-10-CM | POA: Diagnosis not present

## 2016-04-11 NOTE — Patient Instructions (Signed)

## 2016-04-11 NOTE — Progress Notes (Signed)
Patient ID: Sally GrayJanice B Wise, female   DOB: 11/27/1945, 70 y.o.   MRN: [id number]   [address] [place], [place]  [id number] Phone: [phone] Fax:  [phone]  Date:  04/11/2016   Patient ID:  [name], DOB 09/02/1945, MRN [id number]   PCP:   Sally Lopeoss, Sally Wise  Cardiologist:  Sally EmmsNishan  History of Present Illness: Sally Wise is a 70 y.o. female with history of PAF, morbid obesity, hypothyroidism, ?chronic prior thrombocytopenia who presents  for evaluation of AF. Per notes she has a remote history of AF, previously being treated in the ED. Her prior cardiologist retired from [facility] and she established care with me in 01/2013 after another ED visit for recurrent AF. By time she saw him in clinic she had spontaneously converted on Cardizem. 2D echo 01/2013: mod LVH, EF 55-65%, no RWMA, mild LAE, grade 1 d/d. She recently called the office for recurrent palpitations and was seen by her PCP and found to be back in AF, rate controlled. This episode lasted about an hour. This was her first episode since 01/2013. Her PCP (Sally Wise) prescribed Xarelto 15mg  BID and after discussion with our office she was recommended to change to the appropriate dose of 20mg  daily. However, she did not feel she required a blood thinner so she did not start yet.  Saw PA 12/15 and was in NSR   She has not had any intercurrent   SOB, bleeding, or syncope. Recent labs 12/22: normal CBC except plt 117, normal BMET except glu 133, TSH wnl. Echo 07/12/14 Reviewed Study Conclusions  - Left ventricle: The cavity size was normal. There was moderate concentric hypertrophy. Systolic function was normal. The estimated ejection fraction was in the range of 50% to 55%. Wall motion was normal; there were no regional wall motion abnormalities. There was an increased relative contribution of atrial contraction to ventricular filling. Doppler parameters are consistent with abnormal left ventricular  relaxation (grade 1 diastolic dysfunction). - Pulmonic valve: There was trivial regurgitation.  Long discussion with patient about options  1) continue cardizem and ASA as episodes are infrequent and long term risks of anticoagulation not insignificant 2) Add pill in pocket flecainide for occurences and continue cardizem and ASA 3) Add flecainide and oral anticoagulation  She has had only 2 episodes in last 5 years  One 12/15 and one 7/14  She is convinced she knows when she is in afib  Given this favor option number 2 She has some bitterness about her daughters spina bifida and inability to get a good job opportunity   January 2017 - with atypical chest, back and epigastric pain. Not exertional Waxes wanes all day partial relief with advil.  W/U Sally Wise normal ECG and CXR.  Turned out to be shingles   08/08/15  myovue non ischemic diaphragmatic attenuation EF 72%   Recent Labs: No results found for requested labs within last 8760 hours.  Wt Readings from Last 3 Encounters:  04/11/16 (!) 142.9 kg (315 lb)  04/08/16 (!) 142.6 kg (314 lb 6.4 oz)  08/07/15 (!) 141.1 kg (311 lb)     Past Medical History:  Diagnosis Date  . Hematuria    evaluation with Dr. Etta Wise  . Hypothyroidism 06/2010  . Kidney stone 04/2004  . Lichen sclerosus et atrophicus 2/13   biopsy proven  . LVH (left ventricular hypertrophy)   . Morbid obesity (HCC)   . PAF (paroxysmal atrial fibrillation) (HCC)    a.  Remote hx, re-established care 01/2013 with Dr. Eden Wise for recurrence. 2D echo 01/2013: mod LVH, EF 55-65%, no RWMA, grade 1 d/d. b. 06/2014: started on Xarelto.   . Thrombocytopenia (HCC)   . Vitamin D deficiency 06/2010  . Wears glasses     Current Outpatient Prescriptions  Medication Sig Dispense Refill  . Cholecalciferol (VITAMIN D PO) Take 200 Int'l Units by mouth.    Marland Kitchen ibuprofen (ADVIL,MOTRIN) 200 MG tablet Take 200 mg by mouth daily.     Marland Kitchen levothyroxine (SYNTHROID, LEVOTHROID) 112 MCG tablet  Take 112 mcg by mouth daily before breakfast.    . diltiazem (CARDIZEM CD) 120 MG 24 hr capsule Take 1 capsule (120 mg total) by mouth daily. 90 capsule 3         No current facility-administered medications for this visit.    Allergies:   Review of patient's allergies indicates no known allergies.   Social History:  The patient  reports that she has never smoked. She has never used smokeless tobacco. She reports that she does not drink alcohol or use drugs.   Family History:  The patient's family history includes Alzheimer's disease in her father; Diabetes in her brother; Hypertension in her brother and mother; Kidney disease in her mother; Spina bifida in her daughter.   ROS:  Please see the history of present illness.   All other systems reviewed and negative.   PHYSICAL EXAM:  VS:  BP 138/86   Pulse 64   Ht 5\' 7"  (1.702 m)   Wt (!) 142.9 kg (315 lb)   LMP 07/14/2001   SpO2 96%   BMI 49.34 kg/m  Well nourished, well developed obese WF, in no acute distress HEENT: normal Neck: no JVD, no carotid bruits Cardiac:  normal S1, S2; RRR; no murmur Lungs:  clear to auscultation bilaterally, no wheezing, rhonchi or rales Abd: soft, nontender, no hepatomegaly Ext: no edema Skin: warm and dry Neuro:  moves all extremities spontaneously, no focal abnormalities noted  EKG:  NSR 67bpm, no acute ST-T changes, QTc   07/06/14  04/11/16 SR rate 57 LAD low voltage   ASSESSMENT AND PLAN:  1. PAF - CHADVASC 2 infrequent episodes  Continue cardizem and ASA add pill in pocket flecainide.   2. Hypothyroidism - TSH recently normal. 3. Morbid obesity - she declined sleep study. We briefly discussed lifestyle modification in the context of recent elevated blood sugar to include increased physical activity and dietary habits. 4. Thrombocytopenia, possibly chronic - not a drinker seen by Heme and no further w/u deemed necessary 5. Hyperglycemia - A1c only 5.7 last visit  6. Chest pain  myovue  done 08/08/15 normal   F/u with me in a year   Charlton Haws

## 2016-05-02 DIAGNOSIS — Z23 Encounter for immunization: Secondary | ICD-10-CM | POA: Diagnosis not present

## 2016-05-15 ENCOUNTER — Ambulatory Visit: Payer: Medicare Other | Admitting: Obstetrics & Gynecology

## 2016-06-16 DIAGNOSIS — J329 Chronic sinusitis, unspecified: Secondary | ICD-10-CM | POA: Diagnosis not present

## 2016-06-25 ENCOUNTER — Other Ambulatory Visit: Payer: Self-pay | Admitting: Cardiovascular Disease

## 2016-11-15 DIAGNOSIS — Z1231 Encounter for screening mammogram for malignant neoplasm of breast: Secondary | ICD-10-CM | POA: Diagnosis not present

## 2016-11-17 DIAGNOSIS — M5441 Lumbago with sciatica, right side: Secondary | ICD-10-CM | POA: Diagnosis not present

## 2016-11-18 ENCOUNTER — Encounter: Payer: Self-pay | Admitting: Obstetrics & Gynecology

## 2016-11-19 DIAGNOSIS — M5441 Lumbago with sciatica, right side: Secondary | ICD-10-CM | POA: Diagnosis not present

## 2016-12-01 DIAGNOSIS — M5441 Lumbago with sciatica, right side: Secondary | ICD-10-CM | POA: Diagnosis not present

## 2017-01-08 DIAGNOSIS — H43311 Vitreous membranes and strands, right eye: Secondary | ICD-10-CM | POA: Diagnosis not present

## 2017-01-08 DIAGNOSIS — H26491 Other secondary cataract, right eye: Secondary | ICD-10-CM | POA: Diagnosis not present

## 2017-01-08 DIAGNOSIS — Z961 Presence of intraocular lens: Secondary | ICD-10-CM | POA: Diagnosis not present

## 2017-01-22 DIAGNOSIS — E559 Vitamin D deficiency, unspecified: Secondary | ICD-10-CM | POA: Diagnosis not present

## 2017-01-22 DIAGNOSIS — E039 Hypothyroidism, unspecified: Secondary | ICD-10-CM | POA: Diagnosis not present

## 2017-01-22 DIAGNOSIS — D696 Thrombocytopenia, unspecified: Secondary | ICD-10-CM | POA: Diagnosis not present

## 2017-01-22 DIAGNOSIS — E7800 Pure hypercholesterolemia, unspecified: Secondary | ICD-10-CM | POA: Diagnosis not present

## 2017-01-28 DIAGNOSIS — Z6841 Body Mass Index (BMI) 40.0 and over, adult: Secondary | ICD-10-CM | POA: Diagnosis not present

## 2017-01-28 DIAGNOSIS — D696 Thrombocytopenia, unspecified: Secondary | ICD-10-CM | POA: Diagnosis not present

## 2017-01-28 DIAGNOSIS — Z0000 Encounter for general adult medical examination without abnormal findings: Secondary | ICD-10-CM | POA: Diagnosis not present

## 2017-01-28 DIAGNOSIS — I480 Paroxysmal atrial fibrillation: Secondary | ICD-10-CM | POA: Diagnosis not present

## 2017-03-23 ENCOUNTER — Other Ambulatory Visit: Payer: Self-pay | Admitting: Cardiovascular Disease

## 2017-03-25 ENCOUNTER — Other Ambulatory Visit: Payer: Self-pay

## 2017-03-25 MED ORDER — DILTIAZEM HCL ER COATED BEADS 180 MG PO CP24: 180.0000 mg | ORAL_CAPSULE | Freq: Every day | ORAL | 2 refills | Status: DC

## 2017-04-20 ENCOUNTER — Ambulatory Visit: Payer: Medicare Other | Admitting: Nurse Practitioner

## 2017-05-04 ENCOUNTER — Encounter: Payer: Self-pay | Admitting: Obstetrics and Gynecology

## 2017-05-04 ENCOUNTER — Ambulatory Visit (INDEPENDENT_AMBULATORY_CARE_PROVIDER_SITE_OTHER): Payer: Medicare Other | Admitting: Obstetrics and Gynecology

## 2017-05-04 VITALS — BP 122/80 | HR 88 | Resp 18 | Ht 67.5 in | Wt 335.0 lb

## 2017-05-04 DIAGNOSIS — Z01419 Encounter for gynecological examination (general) (routine) without abnormal findings: Secondary | ICD-10-CM | POA: Diagnosis not present

## 2017-05-04 DIAGNOSIS — N814 Uterovaginal prolapse, unspecified: Secondary | ICD-10-CM | POA: Diagnosis not present

## 2017-05-04 DIAGNOSIS — L900 Lichen sclerosus et atrophicus: Secondary | ICD-10-CM

## 2017-05-04 DIAGNOSIS — N8111 Cystocele, midline: Secondary | ICD-10-CM

## 2017-05-04 DIAGNOSIS — N9089 Other specified noninflammatory disorders of vulva and perineum: Secondary | ICD-10-CM | POA: Diagnosis not present

## 2017-05-04 DIAGNOSIS — R351 Nocturia: Secondary | ICD-10-CM | POA: Diagnosis not present

## 2017-05-04 NOTE — Patient Instructions (Signed)

## 2017-05-04 NOTE — Progress Notes (Signed)
71 y.o. [id number] MarriedCaucasianF here for annual exam.  She has a h/o lichen sclerosis, intermittently itches. She uses the steroid ointment intermittently. She doesn't need a script for the steroid ointment, will call if she needs more.  She c/o nocturia 3-4 x a night. Not voiding large amounts. During the day she voids frequency during the day, but still not large amounts. Feels empty when she is done voiding.  She can feel her cervix at the opening of her vagina when she bathes, it doesn't bother her.  Sexually active, no pain.  No vaginal bleeding.     Patient's last menstrual period was 07/14/2001.          Sexually active: Yes.    The current method of family planning is post menopausal status.    Exercising: No.  The patient does not participate in regular exercise at present. Smoker:  no  Health Maintenance: Pap:  01-04-15 WNL 10-28-12 WNL  History of abnormal Pap:  no MMG:  11-15-16 WNL  Colonoscopy:  02-06-14 polyps repeat in 3-5 yrs BMD:   01-08-11 normal TDaP:  07-06-07 Gardasil: N/A   reports that she has never smoked. She has never used smokeless tobacco. She reports that she does not drink alcohol or use drugs. She has a daughter with spina bifida, lives with them (she works, Veterinary surgeon). Other daughter is local. No grand children. She is a retired Dispensing optician. Happily married, husband is 76.  Mom is 84, just moved into assisted living, mental with it, not happy with her living situation. Brother is an Public relations account executive.   Past Medical History:  Diagnosis Date  . Hematuria    evaluation with Dr. Etta Grandchild  . Hypothyroidism 06/2010  . Kidney stone 04/2004  . Lichen sclerosus et atrophicus 2/13   biopsy proven  . LVH (left ventricular hypertrophy)   . Morbid obesity (HCC)   . PAF (paroxysmal atrial fibrillation) (HCC)    a. Remote hx, re-established care 01/2013 with Dr. Eden Emms for recurrence. 2D echo 01/2013: mod LVH, EF 55-65%, no RWMA, grade 1 d/d. b. 06/2014: started on  Xarelto.   . Thrombocytopenia (HCC)   . Vitamin D deficiency 06/2010  . Wears glasses     Past Surgical History:  Procedure Laterality Date  . BREAST EXCISIONAL BIOPSY  2010   left--was a vascular lesion  . CARPAL TUNNEL RELEASE Left 08/09/2013   Procedure: LEFT CARPAL TUNNEL RELEASE;  Surgeon: Wyn Forster., MD;  Location: [facility];  Service: Orthopedics;  Laterality: Left;  . CATARACT EXTRACTION    . COLONOSCOPY  04/2003      . DILATION AND CURETTAGE OF UTERUS    . HYSTEROSCOPY W/D&C  8/99  . 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 TONSILLECTOMY    . TRIGGER FINGER RELEASE Left 08/09/2013   Procedure: LEFT A1/A2 PULLEY RELEASE;  Surgeon: Wyn Forster., MD;  Location: [facility];  Service: Orthopedics;  Laterality: Left;  . TUBAL LIGATION  5/77    Current Outpatient Prescriptions  Medication Sig Dispense Refill  . aspirin EC 81 MG tablet Take 81 mg by mouth daily.    . Cholecalciferol (VITAMIN D3) 5000 UNITS TABS Take 1 tablet by mouth daily.    . clobetasol ointment (TEMOVATE) 0.05 % Apply 1 application topically 2 (two) times daily. Apply as directed twice daily 60 g 3  . diltiazem (CARTIA XT) 180 MG 24 hr capsule Take 1 capsule (180 mg total) by  mouth daily. 90 capsule 2  . flecainide (TAMBOCOR) 150 MG tablet Take 300 mg by mouth daily as needed (AFib episode).    Marland Kitchen levothyroxine (SYNTHROID, LEVOTHROID) 112 MCG tablet Take 112 mcg by mouth daily before breakfast.    . Multiple Vitamins-Minerals (CENTRUM SILVER ULTRA WOMENS PO) Take 1 tablet by mouth daily.     No current facility-administered medications for this visit.     Family History  Problem Relation Age of Onset  . Hypertension Mother   . Kidney disease Mother   . Alzheimer's disease Father   . Diabetes Brother   . Hypertension Brother   . Spina bifida Daughter     Review of Systems  Constitutional: Negative.   HENT: Negative.   Eyes: Negative.    Respiratory: Negative.   Cardiovascular: Negative.   Gastrointestinal: Negative.   Endocrine: Negative.   Genitourinary:       Urinary frequency at night  Musculoskeletal: Negative.   Skin: Negative.   Allergic/Immunologic: Negative.   Neurological: Negative.   Psychiatric/Behavioral: Negative.     Exam:   BP 122/80 (BP Location: Right Arm, Patient Position: Sitting, Cuff Size: Normal)   Pulse 88   Resp 18   Ht 5' 7.5" (1.715 m)   Wt (!) 335 lb (152 kg)   LMP 07/14/2001   BMI 51.69 kg/m   Weight change: @WEIGHTCHANGE @ Height:   Height: 5' 7.5" (171.5 cm)  Ht Readings from Last 3 Encounters:  05/04/17 5' 7.5" (1.715 m)  04/11/16 5\' 7"  (1.702 m)  04/08/16 5' 7.25" (1.708 m)    General appearance: alert, cooperative and appears stated age Head: Normocephalic, without obvious abnormality, atraumatic Neck: no adenopathy, supple, symmetrical, trachea midline and thyroid normal to inspection and palpation Lungs: clear to auscultation bilaterally Cardiovascular: regular rate and rhythm Breasts: normal appearance, no masses or tenderness Abdomen: soft, non-tender; non distended,  no masses,  no organomegaly Extremities: extremities normal, atraumatic, no cyanosis or edema Skin: Skin color, texture, turgor normal. No rashes or lesions Lymph nodes: Cervical, supraclavicular, and axillary nodes normal. No abnormal inguinal nodes palpated Neurologic: Grossly normal   Pelvic: External genitalia:  Small area of whitening on the lower right labia minora, small raised white lesion within that area              Urethra:  normal appearing urethra with no masses, tenderness or lesions              Bartholins and Skenes: normal                 Vagina: normal appearing vagina with normal color and discharge, no lesions  Grade 2 uterine prolapse (with valsalva while lying down), grade 1-2 cystocele, no significant rectocele              Cervix: no lesions               Bimanual Exam:   Uterus: not appreciably enlarged, limited exam secondary to BMI              Adnexa: no mass, fullness, tenderness               Rectovaginal: Confirms               Anus:  normal sphincter tone, no lesions  Chaperone was present for exam.  A:  Well Woman with normal exam  Lichen sclerosis, new raised area on inner right labia minora.   Genital prolapse  Nocturia, small amounts  P:   No pap this year, will check it again in 2 years  Return for a vulvar biopsy  Mammogram UTD  Colonoscopy UTD  Labs with primary MD  Discussed breast self exam  Discussed checking a urine dip, she is unable to void, doesn't feel she has a UTI, will consider leaving us a urine at her next visit  We discussed pushing up her prolapse when she is voiding to make sure she is emptying  We discussed trying to go back to sleep and not get up to void every time she is up at night (small amounts)

## 2017-05-05 NOTE — Progress Notes (Signed)
Patient ID: Sally Wise, female   DOB: 02/28/1946, 71 y.o.   MRN: [id number]   [address] [place], [place]  [address] Phone: [phone] Fax:  [phone]  Date:  05/08/2017   Patient ID:  Sally, Wise 06-30-1946, MRN [id number]   PCP:  Daisy Floro, MD  Cardiologist:  Eden Emms  History of Present Illness: Sally Wise is a 71 y.o. female with history of PAF, morbid obesity, hypothyroidism, ?chronic prior thrombocytopenia who presents  for f/u  of AF. Has had a few episodes of PAF lasting an hour or two mostly in 2014 and 2015    Echo 07/12/14 Reviewed Study Conclusions  - Left ventricle: The cavity size was normal. There was moderate concentric hypertrophy. Systolic function was normal. The estimated ejection fraction was in the range of 50% to 55%. Wall motion was normal; there were no regional wall motion abnormalities. There was an increased relative contribution of atrial contraction to ventricular filling. Doppler parameters are consistent with abnormal left ventricular relaxation (grade 1 diastolic dysfunction). - Pulmonic valve: There was trivial regurgitation.  Since episodes infrequent using cardizem ASA and pill in pocket flecainide  She has had only 2 episodes in last 5 years  One 12/15 and one 7/14  She is convinced she knows when she is in afib    Daughter has  spina bifida     January 2017 - with atypical chest pain had shingles   08/08/15  myovue non ischemic diaphragmatic attenuation EF 72%   Recent Labs: No results found for requested labs within last 8760 hours.  Wt Readings from Last 3 Encounters:  05/08/17 (!) 335 lb 8 oz (152.2 kg)  05/04/17 (!) 335 lb (152 kg)  04/11/16 (!) 315 lb (142.9 kg)     Past Medical History:  Diagnosis Date  . Hematuria    evaluation with Dr. Etta Grandchild  . Hypothyroidism 06/2010  . Kidney stone 04/2004  . Lichen sclerosus et atrophicus 2/13   biopsy proven  . LVH (left  ventricular hypertrophy)   . Morbid obesity (HCC)   . PAF (paroxysmal atrial fibrillation) (HCC)    a. Remote hx, re-established care 01/2013 with Dr. Eden Emms for recurrence. 2D echo 01/2013: mod LVH, EF 55-65%, no RWMA, grade 1 d/d. b. 06/2014: started on Xarelto.   . Thrombocytopenia (HCC)   . Vitamin D deficiency 06/2010  . Wears glasses     Current Outpatient Prescriptions  Medication Sig Dispense Refill  . Cholecalciferol (VITAMIN D PO) Take 200 Int'l Units by mouth.    Marland Kitchen ibuprofen (ADVIL,MOTRIN) 200 MG tablet Take 200 mg by mouth daily.     Marland Kitchen levothyroxine (SYNTHROID, LEVOTHROID) 112 MCG tablet Take 112 mcg by mouth daily before breakfast.    . diltiazem (CARDIZEM CD) 120 MG 24 hr capsule Take 1 capsule (120 mg total) by mouth daily. 90 capsule 3         No current facility-administered medications for this visit.    Allergies:   Patient has no known allergies.   Social History:  The patient  reports that she has never smoked. She has never used smokeless tobacco. She reports that she does not drink alcohol or use drugs.   Family History:  The patient's family history includes Alzheimer's disease in her father; Diabetes in her brother; Hypertension in her brother and mother; Kidney disease in her mother; Spina bifida in her daughter.   ROS:  Please see the history of present  illness.   All other systems reviewed and negative.   PHYSICAL EXAM:  VS:  BP 130/88   Pulse 76   Ht 5' 7.5" (1.715 m)   Wt (!) 335 lb 8 oz (152.2 kg)   LMP 07/14/2001   SpO2 97%   BMI 51.77 kg/m  Well nourished, well developed obese WF, in no acute distress  HEENT: normal  Neck: no JVD, no carotid bruits Cardiac:  normal S1, S2; RRR; no murmur  Lungs:  clear to auscultation bilaterally, no wheezing, rhonchi or rales  Abd: soft, nontender, no hepatomegaly  Ext: no edema  Skin: warm and dry  Neuro:  moves all extremities spontaneously, no focal abnormalities noted  EKG:  NSR 67bpm, no acute ST-T  changes, QTc 414ms   07/06/14  04/11/16 SR rate 57 LAD low voltage  05/08/17 SR rate 72 insignificant Q inferiorly low Precordial voltage from obesity   ASSESSMENT AND PLAN:  1. PAF - CHADVASC 2 infrequent episodes  Continue cardizem and ASA  pill in pocket flecainide.   2. Hypothyroidism - TSH recently normal. 3. Morbid obesity - she declined sleep study. We briefly discussed lifestyle modification in the context of recent elevated blood sugar to include increased physical activity and dietary habits. 4. Thrombocytopenia, possibly chronic - not a drinker seen by Heme and no further w/u deemed necessary 5. Hyperglycemia - A1c only 5.7 last visit  6. Chest pain  myovue done 08/08/15 normal  7. IT:  F/u with primary consider f/u imaging of spleen would only be an issue if anticoagulation Needed in future   F/u with me in a year   Charlton Hawseter Narcissa Melder

## 2017-05-07 ENCOUNTER — Telehealth: Payer: Self-pay | Admitting: Obstetrics and Gynecology

## 2017-05-07 NOTE — Telephone Encounter (Signed)
Patient is calling to schedule an appointment for a biopsy.

## 2017-05-08 ENCOUNTER — Encounter: Payer: Self-pay | Admitting: Cardiovascular Disease

## 2017-05-08 ENCOUNTER — Ambulatory Visit (INDEPENDENT_AMBULATORY_CARE_PROVIDER_SITE_OTHER): Payer: Medicare Other | Admitting: Cardiovascular Disease

## 2017-05-08 VITALS — BP 130/88 | HR 76 | Ht 67.5 in | Wt 335.5 lb

## 2017-05-08 DIAGNOSIS — I517 Cardiomegaly: Secondary | ICD-10-CM | POA: Diagnosis not present

## 2017-05-08 DIAGNOSIS — I480 Paroxysmal atrial fibrillation: Secondary | ICD-10-CM

## 2017-05-08 MED ORDER — FLECAINIDE ACETATE 150 MG PO TABS: 300.0000 mg | ORAL_TABLET | Freq: Every day | ORAL | 1 refills | Status: DC | PRN

## 2017-05-08 NOTE — Patient Instructions (Signed)

## 2017-05-11 ENCOUNTER — Other Ambulatory Visit: Payer: Self-pay | Admitting: *Deleted

## 2017-05-11 DIAGNOSIS — N9089 Other specified noninflammatory disorders of vulva and perineum: Secondary | ICD-10-CM

## 2017-06-01 ENCOUNTER — Encounter: Payer: Self-pay | Admitting: Obstetrics and Gynecology

## 2017-06-01 ENCOUNTER — Other Ambulatory Visit: Payer: Self-pay

## 2017-06-01 ENCOUNTER — Ambulatory Visit (INDEPENDENT_AMBULATORY_CARE_PROVIDER_SITE_OTHER): Payer: Medicare Other | Admitting: Obstetrics and Gynecology

## 2017-06-01 DIAGNOSIS — N9089 Other specified noninflammatory disorders of vulva and perineum: Secondary | ICD-10-CM

## 2017-06-01 DIAGNOSIS — L280 Lichen simplex chronicus: Secondary | ICD-10-CM | POA: Diagnosis not present

## 2017-06-01 NOTE — Patient Instructions (Signed)

## 2017-06-01 NOTE — Progress Notes (Signed)
GYNECOLOGY  VISIT   HPI: 71 y.o.   Married  Caucasian  female   G2P2002 with Patient's last menstrual period was 07/14/2001.   here for vulvar BX. The patient has a h/o lichen sclerosis. At her recent annual exam she was noted to have a raised white lesion on the right labia minora, right next to her clitoris.   GYNECOLOGIC HISTORY: Patient's last menstrual period was 07/14/2001. Contraception:postmenopause  Menopausal hormone therapy: none         OB History    Gravida Para Term Preterm AB Living   2 2 2  0 0 2   SAB TAB Ectopic Multiple Live Births   0 0 0 0 2         Patient Active Problem List   Diagnosis Date Noted  . Lichen sclerosus et atrophicus 01/04/2015  . LVH (left ventricular hypertrophy)   . PAF (paroxysmal atrial fibrillation) (HCC) 07/06/2014  . Thrombocytopenia (HCC) 07/06/2014  . Hyperglycemia 07/06/2014  . Morbid obesity (HCC) 01/25/2013  . Hypothyroidism 01/15/2013    Past Medical History:  Diagnosis Date  . Hematuria    evaluation with Dr. Etta GrandchildSural  . Hypothyroidism 06/2010  . Kidney stone 04/2004  . Lichen sclerosus et atrophicus 2/13   biopsy proven  . LVH (left ventricular hypertrophy)   . Morbid obesity (HCC)   . PAF (paroxysmal atrial fibrillation) (HCC)    a. Remote hx, re-established care 01/2013 with Dr. Eden EmmsNishan for recurrence. 2D echo 01/2013: mod LVH, EF 55-65%, no RWMA, grade 1 d/d. b. 06/2014: started on Xarelto.   . Thrombocytopenia (HCC)   . Vitamin D deficiency 06/2010  . Wears glasses     Past Surgical History:  Procedure Laterality Date  . BREAST EXCISIONAL BIOPSY  2010   left--was a vascular lesion  . CATARACT EXTRACTION    . COLONOSCOPY  04/2003      . DILATION AND CURETTAGE OF UTERUS    . HYSTEROSCOPY W/D&C  8/99  . LEFT A1/A2 PULLEY RELEASE Left 08/09/2013   [facility]  . LEFT CARPAL TUNNEL RELEASE Left 08/09/2013   [facility]ypher [facility]  . 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 TONSILLECTOMY    . TUBAL LIGATION  5/77    Current Outpatient Medications  Medication Sig Dispense Refill  . Cholecalciferol (VITAMIN D3) 5000 UNITS TABS Take 1 tablet by mouth daily.    . clobetasol ointment (TEMOVATE) 0.05 % Apply 1 application topically 2 (two) times daily. Apply as directed twice daily 60 g 3  . diltiazem (CARTIA XT) 180 MG 24 hr capsule Take 1 capsule (180 mg total) by mouth daily. 90 capsule 2  . flecainide (TAMBOCOR) 150 MG tablet Take 2 tablets (300 mg total) by mouth daily as needed (AFib episode). 15 tablet 1  . levothyroxine (SYNTHROID, LEVOTHROID) 112 MCG tablet Take 112 mcg by mouth daily before breakfast.    . Multiple Vitamins-Minerals (CENTRUM SILVER ULTRA WOMENS PO) Take 1 tablet by mouth daily.     No current facility-administered medications for this visit.      ALLERGIES: Patient has no known allergies.  Family History  Problem Relation Age of Onset  . Hypertension Mother   . Kidney disease Mother   . Alzheimer's disease Father   . Diabetes Brother   . Hypertension Brother   . Spina bifida Daughter     Social  History   Socioeconomic History  . Marital status: Married    Spouse name: Not on file  . Number of children: Not on file  . Years of education: Not on file  . Highest education level: Not on file  Social Needs  . Financial resource strain: Not on file  . Food insecurity - worry: Not on file  . Food insecurity - inability: Not on file  . Transportation needs - medical: Not on file  . Transportation needs - non-medical: Not on file  Occupational History  . Not on file  Tobacco Use  . Smoking status: Never Smoker  . Smokeless tobacco: Never Used  Substance and Sexual Activity  . Alcohol use: No  . Drug use: No  . Sexual activity: Yes    Partners: Male    Birth control/protection: Surgical  Other Topics Concern  . Not on file  Social History Narrative   . Not on file    Review of Systems  Constitutional: Negative.   HENT: Negative.   Eyes: Negative.   Respiratory: Negative.   Cardiovascular: Negative.   Gastrointestinal: Negative.   Genitourinary: Negative.   Musculoskeletal: Negative.   Skin: Negative.   Neurological: Negative.   Endo/Heme/Allergies: Negative.   Psychiatric/Behavioral: Negative.     PHYSICAL EXAMINATION:    BP 128/78 (BP Location: Right Wrist, Patient Position: Sitting, Cuff Size: Large)   Pulse 72   Resp 18   Wt (!) 335 lb (152 kg)   LMP 07/14/2001   BMI 51.69 kg/m     General appearance: alert, cooperative and appears stated age  Pelvic: External genitalia:  Whitening on the inner right labia minora, 3 mm raised lesion on the right superior labia minora (just under the clitoris)              Urethra:  normal appearing urethra with no masses, tenderness or lesions              Bartholins and Skenes: normal                  The risks of the procedure were reviewed with the patient and a consent was signed. The area was cleansed with betadine and injected with 1% lidocaine. A 4 mm punch biopsy was used to remove a circular piece of tissue. The defect was closed with 4-0 vicryl. The patient tolerated the procedure well.    Chaperone was present for exam.  ASSESSMENT Vulvar lesion    PLAN Biopsy done Given the location of the lesion to the clitoris if she needs further removal will likely refer to GYN oncology   An After Visit Summary was printed and given to the patient.

## 2017-06-08 ENCOUNTER — Telehealth: Payer: Self-pay | Admitting: *Deleted

## 2017-06-08 NOTE — Telephone Encounter (Signed)
The discussion about seeing her more frequently was if the biopsy showed pre-cancerous changes. Since it did not, I think yearly visits are fine. Obviously if she has any concerns she should be seen sooner.

## 2017-06-08 NOTE — Telephone Encounter (Signed)
Call to patient. Results reviewed with patient as seen below from Dr. Oscar LaJertson. Patient verbalized understanding. Patient asking if Dr. Oscar LaJertson wants to see her before her AEX in December 2019. Patient states she thought she mentioned something to her about "being seen every six months." RN advised would review with Dr. Oscar LaJertson and return call with any additional recommendations. Patient agreeable.   Routing to provider for review.

## 2017-06-08 NOTE — Telephone Encounter (Signed)
Message given to patient as seen below from Dr. Oscar LaJertson. Patient verbalized understanding.   Patient agreeable to disposition. Will close encounter.

## 2017-06-08 NOTE — Telephone Encounter (Signed)
-----   Message from Romualdo BolkJill Evelyn Jertson, MD sent at 06/03/2017  4:14 PM EST ----- The patient was very worried about this result. Please inform the patient that her vulvar biopsy was benign, just shows chronic irritation. She can use the steroid ointment she has if she is feeling irritated in that area (2 x a day for a week, I would wait for the wound to heal first)

## 2017-06-12 DIAGNOSIS — Z23 Encounter for immunization: Secondary | ICD-10-CM | POA: Diagnosis not present

## 2017-12-12 DIAGNOSIS — Z1231 Encounter for screening mammogram for malignant neoplasm of breast: Secondary | ICD-10-CM | POA: Diagnosis not present

## 2017-12-18 ENCOUNTER — Other Ambulatory Visit: Payer: Self-pay | Admitting: Cardiovascular Disease

## 2017-12-28 ENCOUNTER — Encounter: Payer: Self-pay | Admitting: Obstetrics and Gynecology

## 2018-02-25 DIAGNOSIS — Z79899 Other long term (current) drug therapy: Secondary | ICD-10-CM | POA: Diagnosis not present

## 2018-02-25 DIAGNOSIS — E039 Hypothyroidism, unspecified: Secondary | ICD-10-CM | POA: Diagnosis not present

## 2018-02-25 DIAGNOSIS — E559 Vitamin D deficiency, unspecified: Secondary | ICD-10-CM | POA: Diagnosis not present

## 2018-02-25 DIAGNOSIS — D696 Thrombocytopenia, unspecified: Secondary | ICD-10-CM | POA: Diagnosis not present

## 2018-03-01 DIAGNOSIS — Z6841 Body Mass Index (BMI) 40.0 and over, adult: Secondary | ICD-10-CM | POA: Diagnosis not present

## 2018-03-01 DIAGNOSIS — E039 Hypothyroidism, unspecified: Secondary | ICD-10-CM | POA: Diagnosis not present

## 2018-03-01 DIAGNOSIS — D696 Thrombocytopenia, unspecified: Secondary | ICD-10-CM | POA: Diagnosis not present

## 2018-03-01 DIAGNOSIS — Z0000 Encounter for general adult medical examination without abnormal findings: Secondary | ICD-10-CM | POA: Diagnosis not present

## 2018-03-04 ENCOUNTER — Telehealth: Payer: Self-pay | Admitting: *Deleted

## 2018-03-04 NOTE — Telephone Encounter (Signed)
Once we get the referral and labs, will arrange follow up.

## 2018-03-04 NOTE — Telephone Encounter (Signed)
Returned patient's phone call regarding platelet count. Patient stated,"[facility] My doctor is sending over a referral to see Dr. Clelia CroftShadad. I haven't seen him in a few years. (Last visit was 08/29/14. Has a diagnosis of thrombocytopenia). Informed her that I would be on the look out for the referral and this office will get in touch with her to arrange an appointment. She verbalized understanding.

## 2018-03-09 ENCOUNTER — Telehealth: Payer: Self-pay | Admitting: Oncology

## 2018-03-09 NOTE — Telephone Encounter (Signed)
Spoke to Successammy from [facility] to have the pt's recent labs faxed to our office.

## 2018-03-19 ENCOUNTER — Telehealth: Payer: Self-pay | Admitting: *Deleted

## 2018-03-19 ENCOUNTER — Other Ambulatory Visit: Payer: Self-pay | Admitting: Oncology

## 2018-03-19 DIAGNOSIS — D696 Thrombocytopenia, unspecified: Secondary | ICD-10-CM

## 2018-03-19 NOTE — Telephone Encounter (Signed)
"  [name] calling to schedule an appointment with Dr. Clelia Croft.  Left messages with no return call over the last three days.  [facility] referred me but I'm not a new patient.  I was told my lab information has been faxed to [facility]  Except for next Thursday, my best availability for appointments are Tuesdays or Thursdays.  Return call to number [phone]."

## 2018-03-22 NOTE — Telephone Encounter (Signed)
"  I still have not heard from anyone about an appointment."  Noted scheduling message sent requesting appointment with Dr. Clelia Croft.  Provided [name] with this information.  Expecting call from scheduler.

## 2018-03-26 ENCOUNTER — Telehealth: Payer: Self-pay | Admitting: Oncology

## 2018-03-26 NOTE — Telephone Encounter (Signed)
Called pt regarding upcoming appts but pt hung up phone. Mailed pt calendar

## 2018-03-30 ENCOUNTER — Telehealth: Payer: Self-pay | Admitting: Oncology

## 2018-03-30 NOTE — Telephone Encounter (Signed)
Spoke to pt regarding upcoming appts per 9/13 sch message  °

## 2018-04-05 ENCOUNTER — Telehealth: Payer: Self-pay | Admitting: Oncology

## 2018-04-05 ENCOUNTER — Inpatient Hospital Stay: Payer: Medicare Other | Admitting: Oncology

## 2018-04-05 ENCOUNTER — Inpatient Hospital Stay (HOSPITAL_BASED_OUTPATIENT_CLINIC_OR_DEPARTMENT_OTHER): Payer: Medicare Other | Admitting: Oncology

## 2018-04-05 ENCOUNTER — Inpatient Hospital Stay: Payer: Medicare Other

## 2018-04-05 ENCOUNTER — Inpatient Hospital Stay: Payer: Medicare Other | Attending: Oncology

## 2018-04-05 ENCOUNTER — Ambulatory Visit (HOSPITAL_COMMUNITY)
Admission: RE | Admit: 2018-04-05 | Discharge: 2018-04-05 | Disposition: A | Discharge status: Home / Self Care | Payer: Medicare Other | Source: Ambulatory Visit | Attending: Oncology | Admitting: Oncology

## 2018-04-05 VITALS — BP 139/64 | HR 70 | Temp 98.4°F | Resp 17 | Ht 67.5 in | Wt 354.2 lb

## 2018-04-05 DIAGNOSIS — D696 Thrombocytopenia, unspecified: Secondary | ICD-10-CM | POA: Insufficient documentation

## 2018-04-05 DIAGNOSIS — I480 Paroxysmal atrial fibrillation: Secondary | ICD-10-CM | POA: Insufficient documentation

## 2018-04-05 LAB — CBC WITH DIFFERENTIAL (CANCER CENTER ONLY)
Basophils Absolute: 0 10*3/uL (ref 0.0–0.1)
Basophils Relative: 0 %
EOS PCT: 1 %
Eosinophils Absolute: 0.1 10*3/uL (ref 0.0–0.5)
HEMATOCRIT: 39.3 % (ref 34.8–46.6)
Hemoglobin: 12.5 g/dL (ref 11.6–15.9)
LYMPHS ABS: 1.9 10*3/uL (ref 0.9–3.3)
LYMPHS PCT: 29 %
MCH: 28.7 pg (ref 25.1–34.0)
MCHC: 31.8 g/dL (ref 31.5–36.0)
MCV: 90.3 fL (ref 79.5–101.0)
MONOS PCT: 20 %
Monocytes Absolute: 1.3 10*3/uL — ABNORMAL HIGH (ref 0.1–0.9)
NEUTROS ABS: 3.2 10*3/uL (ref 1.5–6.5)
Neutrophils Relative %: 50 %
PLATELETS: 73 10*3/uL — AB (ref 145–400)
RBC: 4.35 MIL/uL (ref 3.70–5.45)
RDW: 15.9 % — AB (ref 11.2–14.5)
WBC Count: 6.5 10*3/uL (ref 3.9–10.3)

## 2018-04-05 NOTE — Progress Notes (Signed)
Hematology and Oncology Follow Up Visit  Sally Wise [id number] [birth year] 72 y.o. 04/05/2018 8:54 AM Sally Wise, MDRoss, Sally Luo, MD   Principle Diagnosis: 72 year old woman with thrombocytopenia diagnosed in 2016.  She had fluctuating thrombocytopenia since 2008.  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   Prior Therapy: None  Current therapy: Active surveillance.  Interim History: [name] presents today for a follow-up visit.  She is a pleasant woman with history of thrombocytopenia and I saw in 2016 for the same issue.  At that time her platelet count of ranged between 117 and 123.  She had a repeat platelet counts in 2017 her platelet count was 86.  In 2018 there were 95 and in 2019 there were 63 and August of this year.  She is asymptomatic from these findings and has not reported any active bleeding.  She denies any hematochezia, melena or epistaxis.  She did report very little ecchymosis at times.  She remains active without any constitutional symptoms.  She does not report any headaches, blurry vision, syncope or seizures. Does not report any fevers, chills or sweats.  Does not report any cough, wheezing or hemoptysis.  Does not report any chest pain, palpitation, orthopnea or leg edema.  Does not report any nausea, vomiting or abdominal pain.  Does not report any constipation or diarrhea.  Does not report any skeletal complaints.    Does not report frequency, urgency or hematuria.  Does not report any skin rashes or lesions. Does not report any heat or cold intolerance.  Does not report any lymphadenopathy or petechiae.  Does not report any anxiety or depression.  Remaining review of systems is negative.    Medications: I have reviewed the patient's current medications.  Current Outpatient Medications  Medication Sig Dispense Refill  . Cholecalciferol (VITAMIN D3) 5000 UNITS TABS Take 1 tablet by mouth daily.    . clobetasol ointment (TEMOVATE)  0.34 % Apply 1 application topically 2 (two) times daily. Apply as directed twice daily 60 g 3  . diltiazem (CARTIA XT) 180 MG 24 hr capsule Take 1 capsule (180 mg total) by mouth daily. Please make yearly appt with Dr. Johnsie Cancel for October for future refills. 1st attempt 90 capsule 1  . flecainide (TAMBOCOR) 150 MG tablet Take 2 tablets (300 mg total) by mouth daily as needed (AFib episode). 15 tablet 1  . levothyroxine (SYNTHROID, LEVOTHROID) 112 MCG tablet Take 112 mcg by mouth daily before breakfast.    . Multiple Vitamins-Minerals (CENTRUM SILVER ULTRA WOMENS PO) Take 1 tablet by mouth daily.     No current facility-administered medications for this visit.      Allergies: No Known Allergies  Past Medical History, Surgical history, Social history, and Family History were reviewed and updated.    Physical Exam: Blood pressure 139/64, pulse 70, temperature 98.4 F (36.9 C), temperature source Oral, resp. rate 17, height 5' 7.5" (1.715 m), weight (!) 354 lb 3.2 oz (160.7 kg), last menstrual period 07/14/2001, SpO2 96 %.   ECOG: 0 General appearance: alert and cooperative appeared without distress. Head: Normocephalic, without obvious abnormality Oropharynx: No oral thrush or ulcers. Eyes: No scleral icterus.  Pupils are equal and round reactive to light. Lymph nodes: Cervical, supraclavicular, and axillary nodes normal. Heart:regular rate and rhythm, S1, S2 normal, no murmur, click, rub or gallop Lung:chest clear, no wheezing, rales, normal symmetric air entry Abdomin: soft, non-tender, without masses or organomegaly. Neurological: No motor, sensory deficits.  Intact deep tendon reflexes.  Skin: No rashes or lesions.  No petechiae noted.  Small ecchymosis noted on her leg. Musculoskeletal: No joint deformity or effusion.     Lab Results: Lab Results  Component Value Date   WBC 6.5 04/05/2018   HGB 12.5 04/05/2018   HCT 39.3 04/05/2018   MCV 90.3 04/05/2018   PLT 73 (L)  04/05/2018     Chemistry      Component Value Date/Time   NA 139 07/04/2014 1547   K 3.8 07/04/2014 1547   CL 107 07/04/2014 1547   CO2 25 07/04/2014 1547   BUN 18 07/04/2014 1547   CREATININE 0.8 07/04/2014 1547   CREATININE 1.00 10/28/2012 1611      Component Value Date/Time   CALCIUM 8.8 07/04/2014 1547   ALKPHOS 73 01/15/2013 1633   AST 14 01/15/2013 1633   ALT 10 01/15/2013 1633   BILITOT 0.5 01/15/2013 1633       Impression and Plan:  72 year old woman with:  1.Thrombocytopenia diagnosed in 2008.  Her platelet count fluctuated close to normal range and as low as 60,000 in the last few months.  Her platelet count was 60 K in August 2019 without any active bleeding.  The differential diagnosis was reviewed today with the patient again.  This include immune thrombocytopenia such as ITP, reactive thrombocytopenia, and splenic sequestration from splenomegaly.  Primary hematological disorder such as myelodysplastic syndrome or leukemia are considered unlikely.  From a management standpoint, I recommended continued observation and surveillance and obtaining abdominal ultrasound to rule out splenomegaly.  Bone marrow biopsy could be next if any signs of hematological disorder are developing.  Her white cell count and hemoglobin continues to be normal.  A trial of steroids may be needed if her platelet count drops below 50.  She does not require any growth factor support or platelet transfusion.  Risk of bleeding remains very low at this time.  2. Paroxysmal atrial fibrillation: She is off anticoagulation at this time.  Her risk of bleeding is very low.   3.  Follow-up: We will be in 3 months to follow her progress.  15  minutes was spent with the patient face-to-face today.  More than 50% of time was dedicated to reviewing laboratory data in the last 10 years, discussing the differential diagnosis management options.     Zola Button, MD 9/23/20198:54 AM

## 2018-04-05 NOTE — Telephone Encounter (Signed)
Appts scheduled avs/calendar printed / US scheduled per 9/23 los

## 2018-04-06 ENCOUNTER — Telehealth: Payer: Self-pay | Admitting: *Deleted

## 2018-04-06 NOTE — Telephone Encounter (Signed)
L/m for patient to call me  

## 2018-04-06 NOTE — Telephone Encounter (Signed)
-----   Message from Benjiman CoreFiras N Shadad, MD sent at 04/06/2018  8:23 AM EDT ----- Please let her know her US is normal.

## 2018-04-09 ENCOUNTER — Telehealth: Payer: Self-pay | Admitting: *Deleted

## 2018-04-09 NOTE — Telephone Encounter (Signed)
-----   Message from Firas N Shadad, MD sent at 04/06/2018  8:23 AM EDT ----- Please let her know her US is normal. 

## 2018-04-09 NOTE — Telephone Encounter (Signed)
Lm for patient to call dr Alver Fisher nurse.

## 2018-04-09 NOTE — Telephone Encounter (Signed)
Spoke with patient, per dr Clelia Croft, her U/S was normal.

## 2018-05-21 DIAGNOSIS — Z23 Encounter for immunization: Secondary | ICD-10-CM | POA: Diagnosis not present

## 2018-05-29 DIAGNOSIS — J069 Acute upper respiratory infection, unspecified: Secondary | ICD-10-CM | POA: Diagnosis not present

## 2018-06-01 DIAGNOSIS — Z96651 Presence of right artificial knee joint: Secondary | ICD-10-CM | POA: Insufficient documentation

## 2018-06-03 DIAGNOSIS — Z96653 Presence of artificial knee joint, bilateral: Secondary | ICD-10-CM | POA: Diagnosis not present

## 2018-06-03 DIAGNOSIS — Z471 Aftercare following joint replacement surgery: Secondary | ICD-10-CM | POA: Diagnosis not present

## 2018-06-14 ENCOUNTER — Other Ambulatory Visit: Payer: Self-pay | Admitting: Cardiovascular Disease

## 2018-06-14 NOTE — Progress Notes (Signed)
72 y.o. G78P2002 Married White or Caucasian Not Hispanic or Latino female here for annual exam.  Her mother died this year at 87. It has been very hard on her, she misses her very much.  One of her daughters has spina bifida, she is seeing Dr Hyacinth Meeker for AUB and anemia. She is worried about her. She has known prolapse, not really bothering her. She is sexually active, no pain. No incontinence. She reports normal voiding during the day, feels empty, she has nocturia 2-4 x a night (long term). No interest in a pessary. No bowel c/o. She has a h/o lichen sclerosis and reports using her steroid ointment a couple of times a week, not really bothering her. She had a vulvar biopsy last year which showed chronic irritation.     Patient's last menstrual period was 07/14/2001.          Sexually active: Yes.    The current method of family planning is PMP  Exercising: No.  Smoker:  no  Health Maintenance: Pap:  01-04-15 WNL 10-28-12 WNL  History of abnormal Pap:  no MMG:  12/12/2017 Birads 1 negative Colonoscopy:  02-06-14 polyps repeat in 3-5 yrs BMD:   01-08-11 normal TDaP:  07-06-07 Gardasil: N/A   reports that she has never smoked. She has never used smokeless tobacco. She reports that she does not drink alcohol or use drugs.  Past Medical History:  Diagnosis Date  . Hematuria    evaluation with Dr. Etta Grandchild  . Hypothyroidism 06/2010  . Kidney stone 04/2004  . Lichen sclerosus et atrophicus 2/13   biopsy proven  . LVH (left ventricular hypertrophy)   . Morbid obesity (HCC)   . PAF (paroxysmal atrial fibrillation) (HCC)    a. Remote hx, re-established care 01/2013 with Dr. Eden Emms for recurrence. 2D echo 01/2013: mod LVH, EF 55-65%, no RWMA, grade 1 d/d. b. 06/2014: started on Xarelto.   . Thrombocytopenia (HCC)   . Vitamin D deficiency 06/2010  . Wears glasses     Past Surgical History:  Procedure Laterality Date  . BREAST EXCISIONAL BIOPSY  2010   left--was a vascular lesion  . CARPAL TUNNEL  RELEASE Left 08/09/2013   Procedure: LEFT CARPAL TUNNEL RELEASE;  Surgeon: Wyn Forster., MD;  Location: [facility];  Service: Orthopedics;  Laterality: Left;  . CATARACT EXTRACTION    . COLONOSCOPY  04/2003      . DILATION AND CURETTAGE OF UTERUS    . HYSTEROSCOPY W/D&C  8/99  . 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 TONSILLECTOMY    . TRIGGER FINGER RELEASE Left 08/09/2013   Procedure: LEFT A1/A2 PULLEY RELEASE;  Surgeon: Wyn Forster., MD;  Location: [facility];  Service: Orthopedics;  Laterality: Left;  . TUBAL LIGATION  5/77    Current Outpatient Medications  Medication Sig Dispense Refill  . Cholecalciferol (VITAMIN D3) 5000 UNITS TABS Take 1 tablet by mouth daily.    . clobetasol ointment (TEMOVATE) 0.05 % Apply 1 application topically 2 (two) times daily. Apply as directed twice daily 60 g 3  . diltiazem (CARTIA XT) 180 MG 24 hr capsule Take 1 capsule (180 mg total) by mouth daily. Please make yearly appt with Dr. Eden Emms for October for future refills. 1st attempt 90 capsule 1  . flecainide (TAMBOCOR) 150 MG tablet Take 2 tablets (300 mg total) by mouth daily as needed (AFib episode). 15 tablet 1  . levothyroxine (SYNTHROID, LEVOTHROID) 112 MCG  tablet Take 112 mcg by mouth daily before breakfast.    . Multiple Vitamins-Minerals (CENTRUM SILVER ULTRA WOMENS PO) Take 1 tablet by mouth daily.     No current facility-administered medications for this visit.     Family History  Problem Relation Age of Onset  . Hypertension Mother   . Kidney disease Mother   . Alzheimer's disease Father   . Diabetes Brother   . Hypertension Brother   . Spina bifida Daughter     Review of Systems  Constitutional: Positive for unexpected weight change.       Weight gain  HENT: Negative.   Eyes: Negative.   Respiratory: Negative.   Cardiovascular: Negative.   Gastrointestinal: Negative.   Endocrine: Negative.   Genitourinary:        Nocturia  Musculoskeletal: Negative.   Skin: Negative.   Allergic/Immunologic: Negative.   Neurological: Negative.   Hematological: Negative.   Psychiatric/Behavioral:       Mild depression with death of her mother    Exam:   LMP 07/14/2001   Weight change: @WEIGHTCHANGE @ Height:      Ht Readings from Last 3 Encounters:  04/05/18 5' 7.5" (1.715 m)  05/08/17 5' 7.5" (1.715 m)  05/04/17 5' 7.5" (1.715 m)    General appearance: alert, cooperative and appears stated age Head: Normocephalic, without obvious abnormality, atraumatic Neck: no adenopathy, supple, symmetrical, trachea midline and thyroid normal to inspection and palpation Lungs: clear to auscultation bilaterally Cardiovascular: regular rate and rhythm Breasts: normal appearance, no masses or tenderness Abdomen: soft, non-tender; non distended,  no masses,  no organomegaly Extremities: extremities normal, atraumatic, no cyanosis or edema Skin: Skin color, texture, turgor normal. No rashes or lesions Lymph nodes: Cervical, supraclavicular, and axillary nodes normal. No abnormal inguinal nodes palpated Neurologic: Grossly normal   Pelvic: External genitalia:  no lesions, mild whitening of the labia minora, mild agglutination of the labia minora to majora.               Urethra:  normal appearing urethra with no masses, tenderness or lesions              Bartholins and Skenes: normal                 Vagina: normal appearing vagina with normal color and discharge, no lesions              Cervix: no lesions and at her introitus   Grade 2 uterine prolapse with an elongated cervix. Grade 1 cystocele, no significant rectocele.                Bimanual Exam:  Uterus:  exam limited by BMI, no masses noted              Adnexa: no mass, fullness, tenderness               Rectovaginal: Confirms               Anus:  normal sphincter tone, no lesions  Chaperone was present for exam.  A:  Well Woman exam  Lichen sclerosis,  stable  Uterine prolapse, tolerable and stable, declines trial of pessary  Nocturia, normal amounts, normal voiding during the day (no change)  P:   Will check pap in 2021   Mammogram, colonoscopy, dexa UTD  Labs with primary  She will call when she needs a refill on her steroid ointment  Discussed breast self exam  Discussed calcium and vit D intake

## 2018-06-16 ENCOUNTER — Other Ambulatory Visit: Payer: Self-pay | Admitting: Cardiovascular Disease

## 2018-06-16 MED ORDER — DILTIAZEM HCL ER COATED BEADS 180 MG PO CP24: 180.0000 mg | ORAL_CAPSULE | Freq: Every day | ORAL | 0 refills | Status: DC

## 2018-06-16 NOTE — Telephone Encounter (Signed)
Outpatient Medication Detail    Disp Refills Start End   diltiazem (CARTIA XT) 180 MG 24 hr capsule 90 capsule 0 06/16/2018    Sig - Route: Take 1 capsule (180 mg total) by mouth daily. Please keep upcoming appt in December before anymore refills. Thank you - Oral   Sent to pharmacy as: diltiazem (CARTIA XT) 180 MG 24 hr capsule   Notes to Pharmacy: Pt must keep upcoming appt in December before anymore refills. Thank you   E-Prescribing Status: Sent to pharmacy (06/16/2018 10:53 AM EST)   [id number] [place] - [id number]

## 2018-06-17 ENCOUNTER — Ambulatory Visit (INDEPENDENT_AMBULATORY_CARE_PROVIDER_SITE_OTHER): Payer: Medicare Other | Admitting: Obstetrics and Gynecology

## 2018-06-17 ENCOUNTER — Encounter: Payer: Self-pay | Admitting: Obstetrics and Gynecology

## 2018-06-17 VITALS — BP 130/78 | HR 80 | Ht 68.0 in

## 2018-06-17 DIAGNOSIS — L900 Lichen sclerosus et atrophicus: Secondary | ICD-10-CM | POA: Diagnosis not present

## 2018-06-17 DIAGNOSIS — N814 Uterovaginal prolapse, unspecified: Secondary | ICD-10-CM

## 2018-06-17 DIAGNOSIS — Z01419 Encounter for gynecological examination (general) (routine) without abnormal findings: Secondary | ICD-10-CM | POA: Diagnosis not present

## 2018-06-22 ENCOUNTER — Telehealth: Payer: Self-pay

## 2018-06-22 ENCOUNTER — Inpatient Hospital Stay (HOSPITAL_BASED_OUTPATIENT_CLINIC_OR_DEPARTMENT_OTHER): Payer: Medicare Other | Admitting: Oncology

## 2018-06-22 ENCOUNTER — Inpatient Hospital Stay: Payer: Medicare Other | Attending: Oncology

## 2018-06-22 VITALS — BP 143/66 | HR 66 | Temp 98.3°F | Resp 16 | Ht 68.0 in | Wt 355.0 lb

## 2018-06-22 DIAGNOSIS — D696 Thrombocytopenia, unspecified: Secondary | ICD-10-CM

## 2018-06-22 DIAGNOSIS — I4891 Unspecified atrial fibrillation: Secondary | ICD-10-CM | POA: Diagnosis not present

## 2018-06-22 DIAGNOSIS — I480 Paroxysmal atrial fibrillation: Secondary | ICD-10-CM | POA: Diagnosis not present

## 2018-06-22 DIAGNOSIS — D693 Immune thrombocytopenic purpura: Secondary | ICD-10-CM | POA: Insufficient documentation

## 2018-06-22 LAB — CBC WITH DIFFERENTIAL (CANCER CENTER ONLY)
Abs Immature Granulocytes: 0.02 10*3/uL (ref 0.00–0.07)
BASOS PCT: 0 %
Basophils Absolute: 0 10*3/uL (ref 0.0–0.1)
Eosinophils Absolute: 0.1 10*3/uL (ref 0.0–0.5)
Eosinophils Relative: 1 %
HCT: 38.1 % (ref 36.0–46.0)
Hemoglobin: 12 g/dL (ref 12.0–15.0)
IMMATURE GRANULOCYTES: 0 %
Lymphocytes Relative: 33 %
Lymphs Abs: 2.1 10*3/uL (ref 0.7–4.0)
MCH: 28.9 pg (ref 26.0–34.0)
MCHC: 31.5 g/dL (ref 30.0–36.0)
MCV: 91.8 fL (ref 80.0–100.0)
Monocytes Absolute: 1.3 10*3/uL — ABNORMAL HIGH (ref 0.1–1.0)
Monocytes Relative: 20 %
NEUTROS ABS: 3 10*3/uL (ref 1.7–7.7)
NEUTROS PCT: 46 %
PLATELETS: 71 10*3/uL — AB (ref 150–400)
RBC: 4.15 MIL/uL (ref 3.87–5.11)
RDW: 16 % — AB (ref 11.5–15.5)
WBC Count: 6.4 10*3/uL (ref 4.0–10.5)
nRBC: 0 % (ref 0.0–0.2)

## 2018-06-22 NOTE — Progress Notes (Signed)
Hematology and Oncology Follow Up Visit  Sally Wise [id number] 10-29-1945 72 y.o. 06/22/2018 9:55 AM Sally Wise, MDRoss, Sally Round, MD   Principle Diagnosis: 72 year old woman with thrombocytopenia noted in 2008 with the etiology related to immune thrombocytopenia.     Prior Therapy: None  Current therapy: Active surveillance.  Interim History: Sally Wise returns today for a follow-up.  Since last visit, she reports no recent complaints.  She denies any easy bruising, hemoptysis or hematemesis.  She denies any hematochezia or melena.  She remains active and continues to attend activities of daily living.  She does report some mild fatigue but no other complaints.  Has not reported any recent hospitalization or illnesses.  She denies any changes in her performance status.  She does not report any headaches, blurry vision, syncope or seizures.  She denies any alteration mental status or confusion.  Does not report any fevers, chills or sweats.  Does not report any cough, wheezing or hemoptysis.  Does not report any chest pain, palpitation, orthopnea or leg edema.  Does not report any nausea, vomiting or distention. Does not report any constipation or diarrhea.  Does not report any arthralgias or myalgias.    Does not report frequency, urgency or hematuria.  Does not report any ecchymosis or petechiae. Does not report any mood changes.  Does not report any lymphadenopathy or masses.  Remaining review of systems is negative.    Medications: I have reviewed the patient's current medications.  Current Outpatient Medications  Medication Sig Dispense Refill  . Cholecalciferol (VITAMIN D3) 5000 UNITS TABS Take 1 tablet by mouth daily.    . clobetasol ointment (TEMOVATE) 0.05 % Apply 1 application topically 2 (two) times daily. Apply as directed twice daily 60 g 3  . diltiazem (CARTIA XT) 180 MG 24 hr capsule Take 1 capsule (180 mg total) by mouth daily. Please keep upcoming appt in  December before anymore refills. Thank you 90 capsule 0  . flecainide (TAMBOCOR) 150 MG tablet Take 2 tablets (300 mg total) by mouth daily as needed (AFib episode). 15 tablet 1  . levothyroxine (SYNTHROID, LEVOTHROID) 112 MCG tablet Take 112 mcg by mouth daily before breakfast.    . Multiple Vitamins-Minerals (CENTRUM SILVER ULTRA WOMENS PO) Take 1 tablet by mouth daily.     No current facility-administered medications for this visit.      Allergies: No Known Allergies  Past Medical History, Surgical history, Social history, and Family History were reviewed and updated.    Physical Exam: Blood pressure (!) 143/66, pulse 66, temperature 98.3 F (36.8 C), temperature source Oral, resp. rate 16, height 5\' 8"  (1.727 m), weight (!) 355 lb (161 kg), last menstrual period 07/14/2001, SpO2 98 %.   ECOG: 0   General appearance: Comfortable appearing without any discomfort Head: Normocephalic without any trauma Oropharynx: Mucous membranes are moist and pink without any thrush or ulcers. Eyes: Pupils are equal and Wise reactive to light. Lymph nodes: No cervical, supraclavicular, inguinal or axillary lymphadenopathy.   Heart:regular rate and rhythm.  S1 and S2 without leg edema. Lung: Clear without any rhonchi or wheezes.  No dullness to percussion. Abdomin: Soft, nontender, nondistended with good bowel sounds.  No hepatosplenomegaly. Musculoskeletal: No joint deformity or effusion.  Full range of motion noted. Neurological: No deficits noted on motor, sensory and deep tendon reflex exam. Skin: No petechial rash or dryness.  Appeared moist.       Lab Results: Lab Results  Component Value Date  WBC 6.4 06/22/2018   HGB 12.0 06/22/2018   HCT 38.1 06/22/2018   MCV 91.8 06/22/2018   PLT 71 (L) 06/22/2018     Chemistry      Component Value Date/Time   NA 139 07/04/2014 1547   K 3.8 07/04/2014 1547   CL 107 07/04/2014 1547   CO2 25 07/04/2014 1547   BUN 18 07/04/2014 1547    CREATININE 0.8 07/04/2014 1547   CREATININE 1.00 10/28/2012 1611      Component Value Date/Time   CALCIUM 8.8 07/04/2014 1547   ALKPHOS 73 01/15/2013 1633   AST 14 01/15/2013 1633   ALT 10 01/15/2013 1633   BILITOT 0.5 01/15/2013 1633       Impression and Plan:  72 year old woman with:  1.Thrombocytopenia that has been fluctuating and mild dating back to 2008.  The differential diagnosis was reviewed again which includes autoimmune etiologies among other causes such as early myelodysplasia  Abdominal imaging of the spleen obtained in September 2019 showed no abnormalities.  Management options as well as treatment indication were reviewed.  Her platelet count today continues to be above 50,000 without any active bleeding.  Trial of steroids or IVIG may be needed in the future if her platelets drop below 50 or active bleeding is noted.  2. Paroxysmal atrial fibrillation: No recent exacerbation noted.  She is off anticoagulation.   3.  Follow-up: We will be in May 2020.   15  minutes was spent with the patient face-to-face today.  More than 50% of time was dedicated to updating her disease status, differential diagnosis and treatment options.     Sally HoseFiras Juliocesar Blasius, MD 12/10/20199:55 AM

## 2018-06-22 NOTE — Telephone Encounter (Signed)
Printed avs and calender of upcoming appointment. Per 12/10 los 

## 2018-06-23 ENCOUNTER — Other Ambulatory Visit: Payer: Self-pay

## 2018-06-23 MED ORDER — DILTIAZEM HCL ER COATED BEADS 180 MG PO CP24: 180.0000 mg | ORAL_CAPSULE | Freq: Every day | ORAL | 0 refills | Status: DC

## 2018-06-28 NOTE — Progress Notes (Signed)
Patient ID: SHA BURLING, female   DOB: Dec 01, 1945, 72 y.o.   MRN: [id number]   [address] [place], [place]  [address] Phone: [phone] Fax:  [phone]  Date:  07/01/2018   Patient ID:  Sally, Wise Aug 04, 1945, MRN [id number]   PCP:  Sally Floro, MD  Cardiologist:  Sally Wise  History of Present Illness: Sally Wise is a 72 y.o. female with history of PAF, morbid obesity, hypothyroidism thrombocytopenia who presents  for f/u  of AF. Has had a few episodes of PAF lasting an hour or two mostly in 2014 and 2015  07/12/14 EF 50-55% normal LA size no valve disease. Myovue done 08/08/15 diaphragmatic attenuation no ischemia EF 72%   Since episodes infrequent using cardizem ASA and pill in pocket flecainide  She has had only 2 episodes in last 5 years  One 12/15 and one 7/14  She is convinced she knows when she is in afib    Daughter has  spina bifida      Recent Labs: 06/22/2018: Hemoglobin 12.0  Wt Readings from Last 3 Encounters:  07/01/18 (!) 355 lb 9.6 oz (161.3 kg)  06/22/18 (!) 355 lb (161 kg)  04/05/18 (!) 354 lb 3.2 oz (160.7 kg)     Past Medical History:  Diagnosis Date  . Hematuria    evaluation with Dr. Etta Wise  . Hypothyroidism 06/2010  . Kidney stone 04/2004  . Lichen sclerosus et atrophicus 2/13   biopsy proven  . LVH (left ventricular hypertrophy)   . Morbid obesity (HCC)   . PAF (paroxysmal atrial fibrillation) (HCC)    a. Remote hx, re-established care 01/2013 with Dr. Eden Wise for recurrence. 2D echo 01/2013: mod LVH, EF 55-65%, no RWMA, grade 1 d/d. b. 06/2014: started on Xarelto.   . Thrombocytopenia (HCC)   . Vitamin D deficiency 06/2010  . Wears glasses     Current Outpatient Prescriptions  Medication Sig Dispense Refill  . Cholecalciferol (VITAMIN D PO) Take 200 Int'l Units by mouth.    Marland Kitchen ibuprofen (ADVIL,MOTRIN) 200 MG tablet Take 200 mg by mouth daily.     Marland Kitchen levothyroxine (SYNTHROID, LEVOTHROID) 112 MCG tablet Take  112 mcg by mouth daily before breakfast.    . diltiazem (CARDIZEM CD) 120 MG 24 hr capsule Take 1 capsule (120 mg total) by mouth daily. 90 capsule 3         No current facility-administered medications for this visit.    Allergies:   Patient has no known allergies.   Social History:  The patient  reports that she has never smoked. She has never used smokeless tobacco. She reports that she does not drink alcohol or use drugs.   Family History:  The patient's family history includes Alzheimer's disease in her father; Diabetes in her brother; Hypertension in her brother and mother; Kidney disease in her mother; Spina bifida in her daughter.   ROS:  Please see the history of present illness.   All other systems reviewed and negative.   PHYSICAL EXAM:  VS:  BP 114/72   Pulse 74   Ht 5\' 8"  (1.727 m)   Wt (!) 355 lb 9.6 oz (161.3 kg)   LMP 07/14/2001   SpO2 98%   BMI 54.07 kg/m   Affect appropriate Overweight white female  HEENT: normal Neck supple with no adenopathy JVP normal no bruits no thyromegaly Lungs clear with no wheezing and good diaphragmatic motion Heart:  S1/S2 no murmur, no rub,  gallop or click PMI normal Abdomen: benighn, BS positve, no tenderness, no AAA no bruit.  No HSM or HJR Distal pulses intact with no bruits No edema Neuro non-focal Skin warm and dry No muscular weakness   EKG:  NSR 67bpm, no acute ST-T changes, QTc 414ms   07/06/14  04/11/16 SR rate 57 LAD low voltage  05/08/17 SR rate 72 insignificant Q inferiorly low Precordial voltage from obesity   ASSESSMENT AND PLAN:  1. PAF - CHADVASC 2 infrequent episodes  Continue cardizem and ASA  pill in pocket flecainide.   2. Hypothyroidism - TSH recently normal.Labs followed by primary continue current replacement dose synthroid 3. Morbid obesity - discussed caloric intake and exercise  4. Thrombocytopenia, possibly chronic - not a drinker seen by Heme and no further w/u deemed necessary Last       PLT  count noted in Epic 71 06/22/18  5. Hyperglycemia - A1c only 5.7 last visit  6. Chest pain  myovue done 08/08/15 normal  7. IT:  Normal spleen size on US 04/05/18  Dates back to 2008  would only be an issue if anticoagulation       Needed in future F/U Dr Sally CroftShadad Rx if count drops below 50 K   F/u with me in a year   Sally Wise

## 2018-07-01 ENCOUNTER — Ambulatory Visit (INDEPENDENT_AMBULATORY_CARE_PROVIDER_SITE_OTHER): Payer: Medicare Other | Admitting: Cardiovascular Disease

## 2018-07-01 ENCOUNTER — Encounter: Payer: Self-pay | Admitting: Cardiovascular Disease

## 2018-07-01 VITALS — BP 114/72 | HR 74 | Ht 68.0 in | Wt 355.6 lb

## 2018-07-01 DIAGNOSIS — I480 Paroxysmal atrial fibrillation: Secondary | ICD-10-CM | POA: Diagnosis not present

## 2018-07-01 NOTE — Patient Instructions (Signed)
Medication Instructions:   If you need a refill on your cardiac medications before your next appointment, please call your pharmacy.   Lab work:  If you have labs (blood work) drawn today and your tests are completely normal, you will receive your results only by: . MyChart Message (if you have MyChart) OR . A paper copy in the mail If you have any lab test that is abnormal or we need to change your treatment, we will call you to review the results.  Testing/Procedures: NONE ordered today.  Follow-Up: At [facility], you and your health needs are our priority.  As part of our continuing mission to provide you with exceptional heart care, we have created designated Provider Care Teams.  These Care Teams include your primary Cardiologist (physician) and Advanced Practice Providers (APPs -  Physician Assistants and Nurse Practitioners) who all work together to provide you with the care you need, when you need it. You will need a follow up appointment in 1 years.  Please call our office 2 months in advance to schedule this appointment.  You may see Dr. Nishan or one of the following Advanced Practice Providers on your designated Care Team:   Lori Gerhardt, NP Laura Ingold, NP . Jill McDaniel, NP   

## 2018-07-26 DIAGNOSIS — R1084 Generalized abdominal pain: Secondary | ICD-10-CM | POA: Diagnosis not present

## 2018-07-26 DIAGNOSIS — R3121 Asymptomatic microscopic hematuria: Secondary | ICD-10-CM | POA: Diagnosis not present

## 2018-07-26 DIAGNOSIS — R8271 Bacteriuria: Secondary | ICD-10-CM | POA: Diagnosis not present

## 2018-07-26 DIAGNOSIS — N202 Calculus of kidney with calculus of ureter: Secondary | ICD-10-CM | POA: Diagnosis not present

## 2018-08-23 DIAGNOSIS — N200 Calculus of kidney: Secondary | ICD-10-CM | POA: Diagnosis not present

## 2018-09-14 ENCOUNTER — Other Ambulatory Visit: Payer: Self-pay | Admitting: Cardiovascular Disease

## 2018-11-16 ENCOUNTER — Encounter: Payer: Self-pay | Admitting: Oncology

## 2018-11-16 ENCOUNTER — Inpatient Hospital Stay: Payer: Medicare Other | Attending: Oncology

## 2018-11-16 ENCOUNTER — Inpatient Hospital Stay (HOSPITAL_BASED_OUTPATIENT_CLINIC_OR_DEPARTMENT_OTHER): Payer: Medicare Other | Admitting: Oncology

## 2018-11-16 ENCOUNTER — Other Ambulatory Visit: Payer: Self-pay

## 2018-11-16 VITALS — BP 125/84 | HR 83 | Temp 97.9°F | Resp 18 | Ht 68.0 in | Wt 347.9 lb

## 2018-11-16 DIAGNOSIS — I480 Paroxysmal atrial fibrillation: Secondary | ICD-10-CM | POA: Diagnosis not present

## 2018-11-16 DIAGNOSIS — D693 Immune thrombocytopenic purpura: Secondary | ICD-10-CM | POA: Insufficient documentation

## 2018-11-16 DIAGNOSIS — I4891 Unspecified atrial fibrillation: Secondary | ICD-10-CM

## 2018-11-16 DIAGNOSIS — D696 Thrombocytopenia, unspecified: Secondary | ICD-10-CM

## 2018-11-16 LAB — CBC WITH DIFFERENTIAL (CANCER CENTER ONLY)
Abs Immature Granulocytes: 0.04 10*3/uL (ref 0.00–0.07)
Basophils Absolute: 0 10*3/uL (ref 0.0–0.1)
Basophils Relative: 0 %
Eosinophils Absolute: 0 10*3/uL (ref 0.0–0.5)
Eosinophils Relative: 1 %
HCT: 41.3 % (ref 36.0–46.0)
Hemoglobin: 12.8 g/dL (ref 12.0–15.0)
Immature Granulocytes: 1 %
Lymphocytes Relative: 32 %
Lymphs Abs: 2.6 10*3/uL (ref 0.7–4.0)
MCH: 28.1 pg (ref 26.0–34.0)
MCHC: 31 g/dL (ref 30.0–36.0)
MCV: 90.8 fL (ref 80.0–100.0)
Monocytes Absolute: 1.6 10*3/uL — ABNORMAL HIGH (ref 0.1–1.0)
Monocytes Relative: 19 %
Neutro Abs: 3.9 10*3/uL (ref 1.7–7.7)
Neutrophils Relative %: 47 %
Platelet Count: 68 10*3/uL — ABNORMAL LOW (ref 150–400)
RBC: 4.55 MIL/uL (ref 3.87–5.11)
RDW: 16.1 % — ABNORMAL HIGH (ref 11.5–15.5)
WBC Count: 8.2 10*3/uL (ref 4.0–10.5)
nRBC: 0 % (ref 0.0–0.2)

## 2018-11-16 NOTE — Progress Notes (Signed)
Hematology and Oncology Follow Up Visit  Sally Wise [id number] [birth year] 73 y.o. 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     Current therapy: She remains on active surveillance without any indication for treatment.  Interim History: Ms. Hingle is here for a repeat evaluation.  Since last visit, she reports no major changes in her health.  She denies any active bleeding, bruising or recent hospitalization.  She denies hemoptysis or emesis.  Her performance status and quality of life remains excellent.  She denies any recent blood thinners including aspirin or full dose anticoagulation.   He denied any alteration mental status, neuropathy, confusion or dizziness.  Denies any headaches or lethargy.  Denies any night sweats, weight loss or changes in appetite.  Denied orthopnea, dyspnea on exertion or chest discomfort.  Denies shortness of breath, difficulty breathing hemoptysis or cough.  Denies any abdominal distention, nausea, early satiety or dyspepsia.  Denies any hematuria, frequency, dysuria or nocturia.  Denies any skin irritation, dryness or rash.  Denies any ecchymosis or petechiae.  Denies any lymphadenopathy or clotting.  Denies any heat or cold intolerance.  Denies any anxiety or depression.  Remaining review of system is negative.    Medications: I have reviewed the patient's current medications.  Current Outpatient Medications  Medication Sig Dispense Refill  . Cholecalciferol (VITAMIN D3) 5000 UNITS TABS Take 1 tablet by mouth daily.    . clobetasol ointment (TEMOVATE) 0.05 % Apply 1 application topically 2 (two) times daily. Apply as directed twice daily 60 g 3  . diltiazem (CARDIZEM CD) 180 MG 24 hr capsule Take 1 capsule (180 mg total) by mouth daily. 90 capsule 2  . flecainide (TAMBOCOR) 150 MG tablet Take 2 tablets (300 mg total) by mouth daily as needed (AFib episode). 15 tablet  1  . levothyroxine (SYNTHROID, LEVOTHROID) 112 MCG tablet Take 112 mcg by mouth daily before breakfast.    . Multiple Vitamins-Minerals (CENTRUM SILVER ULTRA WOMENS PO) Take 1 tablet by mouth daily.     No current facility-administered medications for this visit.      Allergies: No Known Allergies  Past Medical History, Surgical history, Social history, and Family History were reviewed and updated.    Physical Exam: Blood pressure 125/84, pulse 83, temperature 97.9 F (36.6 C), temperature source Oral, resp. rate 18, height 5\' 8"  (1.727 m), weight (!) 347 lb 14.4 oz (157.8 kg), last menstrual period 07/14/2001, SpO2 96 %.    ECOG: 0     General appearance: Alert, awake without any distress. Head: Atraumatic without abnormalities Oropharynx: Without any thrush or ulcers. Eyes: No scleral icterus. Lymph nodes: No lymphadenopathy noted in the cervical, supraclavicular, or axillary nodes Heart:regular rate and rhythm, without any murmurs or gallops.   Lung: Clear to auscultation without any rhonchi, wheezes or dullness to percussion. Abdomin: Soft, nontender without any shifting dullness or ascites. Musculoskeletal: No clubbing or cyanosis. Neurological: No motor or sensory deficits. Skin: No rashes or lesions.      Lab Results: Lab Results  Component Value Date   WBC 6.4 06/22/2018   HGB 12.0 06/22/2018   HCT 38.1 06/22/2018   MCV 91.8 06/22/2018   PLT 71 (L) 06/22/2018     Chemistry      Component Value Date/Time   NA 139 07/04/2014 1547   K 3.8 07/04/2014 1547   CL 107 07/04/2014 1547   CO2 25 07/04/2014 1547  BUN 18 07/04/2014 1547   CREATININE 0.8 07/04/2014 1547   CREATININE 1.00 10/28/2012 1611      Component Value Date/Time   CALCIUM 8.8 07/04/2014 1547   ALKPHOS 73 01/15/2013 1633   AST 14 01/15/2013 1633   ALT 10 01/15/2013 1633   BILITOT 0.5 01/15/2013 1633       Impression and Plan:  73 year old woman with:  1.ITP diagnosed in 2008.   She presented with isolated thrombocytopenia without any indication for treatment.  The natural course of this disease as well as the differential diagnosis was reviewed again.  ITP remains the most likely etiology although MDS or lymphoproliferative disorder considered less likely but possibly.  Laboratory data dating back to 2009 were reviewed and discussed with her today extensively.  Her platelet count relatively stable today at 68 without any active bleeding.  I have recommended continued observation and surveillance.  Management options for ITP were reviewed which include steroids, IVIG and platelet stimulating agents if needed in the future.   2. Paroxysmal atrial fibrillation: He is not on any anticoagulation at this time.   3.  Follow-up: 4 months for repeat laboratory testing.  25  minutes was spent with the patient face-to-face today.  More than 50% of time was spent on reviewing her laboratory data, differential diagnosis and treatment options and answering questions regarding future plan of care.      Eli HoseFiras Ardella Chhim, MD 5/5/20201:14 PM

## 2018-11-17 ENCOUNTER — Telehealth: Payer: Self-pay | Admitting: Oncology

## 2018-11-17 NOTE — Telephone Encounter (Signed)
Scheduled 4 month f/u per sch msg. Mailed printout.

## 2019-01-03 DIAGNOSIS — Z0120 Encounter for dental examination and cleaning without abnormal findings: Secondary | ICD-10-CM | POA: Diagnosis not present

## 2019-03-18 ENCOUNTER — Telehealth: Payer: Self-pay | Admitting: Oncology

## 2019-03-18 NOTE — Telephone Encounter (Signed)
Returned patient's phone call regarding rescheduling 09/08, patient's husband informed me she was currently not home. Gave him number to reach [place] when she get's back.

## 2019-03-22 ENCOUNTER — Inpatient Hospital Stay: Payer: Medicare Other | Admitting: Oncology

## 2019-03-22 ENCOUNTER — Inpatient Hospital Stay: Payer: Medicare Other

## 2019-03-22 DIAGNOSIS — E039 Hypothyroidism, unspecified: Secondary | ICD-10-CM | POA: Diagnosis not present

## 2019-03-22 DIAGNOSIS — N183 Chronic kidney disease, stage 3 (moderate): Secondary | ICD-10-CM | POA: Diagnosis not present

## 2019-03-22 DIAGNOSIS — E559 Vitamin D deficiency, unspecified: Secondary | ICD-10-CM | POA: Diagnosis not present

## 2019-03-22 DIAGNOSIS — D696 Thrombocytopenia, unspecified: Secondary | ICD-10-CM | POA: Diagnosis not present

## 2019-03-28 DIAGNOSIS — D696 Thrombocytopenia, unspecified: Secondary | ICD-10-CM | POA: Diagnosis not present

## 2019-03-28 DIAGNOSIS — Z0000 Encounter for general adult medical examination without abnormal findings: Secondary | ICD-10-CM | POA: Diagnosis not present

## 2019-03-28 DIAGNOSIS — E7800 Pure hypercholesterolemia, unspecified: Secondary | ICD-10-CM | POA: Diagnosis not present

## 2019-03-28 DIAGNOSIS — E039 Hypothyroidism, unspecified: Secondary | ICD-10-CM | POA: Diagnosis not present

## 2019-04-07 ENCOUNTER — Inpatient Hospital Stay: Payer: Medicare Other | Attending: Oncology

## 2019-04-07 ENCOUNTER — Inpatient Hospital Stay (HOSPITAL_BASED_OUTPATIENT_CLINIC_OR_DEPARTMENT_OTHER): Payer: Medicare Other | Admitting: Oncology

## 2019-04-07 ENCOUNTER — Other Ambulatory Visit: Payer: Self-pay

## 2019-04-07 VITALS — BP 139/70 | HR 77 | Temp 98.2°F | Resp 19 | Wt 346.3 lb

## 2019-04-07 DIAGNOSIS — D72821 Monocytosis (symptomatic): Secondary | ICD-10-CM | POA: Insufficient documentation

## 2019-04-07 DIAGNOSIS — Z79899 Other long term (current) drug therapy: Secondary | ICD-10-CM | POA: Insufficient documentation

## 2019-04-07 DIAGNOSIS — D696 Thrombocytopenia, unspecified: Secondary | ICD-10-CM

## 2019-04-07 LAB — CBC WITH DIFFERENTIAL (CANCER CENTER ONLY)
Abs Immature Granulocytes: 0.03 10*3/uL (ref 0.00–0.07)
Basophils Absolute: 0 10*3/uL (ref 0.0–0.1)
Basophils Relative: 0 %
Eosinophils Absolute: 0.1 10*3/uL (ref 0.0–0.5)
Eosinophils Relative: 1 %
HCT: 39 % (ref 36.0–46.0)
Hemoglobin: 12.4 g/dL (ref 12.0–15.0)
Immature Granulocytes: 0 %
Lymphocytes Relative: 30 %
Lymphs Abs: 2.4 10*3/uL (ref 0.7–4.0)
MCH: 28.4 pg (ref 26.0–34.0)
MCHC: 31.8 g/dL (ref 30.0–36.0)
MCV: 89.4 fL (ref 80.0–100.0)
Monocytes Absolute: 1.6 10*3/uL — ABNORMAL HIGH (ref 0.1–1.0)
Monocytes Relative: 21 %
Neutro Abs: 3.7 10*3/uL (ref 1.7–7.7)
Neutrophils Relative %: 48 %
Platelet Count: 75 10*3/uL — ABNORMAL LOW (ref 150–400)
RBC: 4.36 MIL/uL (ref 3.87–5.11)
RDW: 15.9 % — ABNORMAL HIGH (ref 11.5–15.5)
WBC Count: 7.8 10*3/uL (ref 4.0–10.5)
nRBC: 0 % (ref 0.0–0.2)

## 2019-04-07 NOTE — Progress Notes (Signed)
Hematology and Oncology Follow Up Visit  Sally Wise [id number] 09/20/1945 73 y.o. 04/07/2019 9:19 AM [name], MDRoss, Dwyane Luo, MD   Principle Diagnosis: 73 year old woman with thrombocytopenia and monocytosis it has been fluctuating since at least 2008.  Differential diagnosis includes ITP versus early myelodysplasia which is considered less likely.   Current therapy: Active surveillance.  Interim History: Sally Wise returns today for a follow-up.  Since the last visit, she reports no major changes or complaints.  She denies any recent hospitalizations or illnesses.  She denies any easy bruising or bleeding.  She denies any cough or respiratory complaints.  Her performance status and quality of life remained reasonable.  She does report occasional palpitation and follows with cardiology regarding that.  She denied headaches, blurry vision, syncope or seizures.  Denies any fevers, chills or sweats.  Denied chest pain, palpitation, orthopnea or leg edema.  Denied cough, wheezing or hemoptysis.  Denied nausea, vomiting or abdominal pain.  Denies any constipation or diarrhea.  Denies any frequency urgency or hesitancy.  Denies any arthralgias or myalgias.  Denies any skin rashes or lesions.  Denies any bleeding or clotting tendency.  Denies any easy bruising.  Denies any hair or nail changes.  Denies any anxiety or depression.  Remaining review of system is negative.      Medications: Updated and reviewed. Current Outpatient Medications  Medication Sig Dispense Refill  . Cholecalciferol (VITAMIN D3) 5000 UNITS TABS Take 1 tablet by mouth daily.    . clobetasol ointment (TEMOVATE) 6.29 % Apply 1 application topically 2 (two) times daily. Apply as directed twice daily 60 g 3  . diltiazem (CARDIZEM CD) 180 MG 24 hr capsule Take 1 capsule (180 mg total) by mouth daily. 90 capsule 2  . levothyroxine (SYNTHROID, LEVOTHROID) 112 MCG tablet Take 112 mcg by mouth daily before  breakfast.    . Multiple Vitamins-Minerals (CENTRUM SILVER ULTRA WOMENS PO) Take 1 tablet by mouth daily.    . flecainide (TAMBOCOR) 150 MG tablet Take 2 tablets (300 mg total) by mouth daily as needed (AFib episode). (Patient not taking: Reported on 04/07/2019) 15 tablet 1   No current facility-administered medications for this visit.      Allergies: No Known Allergies  Past Medical History, Surgical history, Social history, and Family History unchanged on review..    Physical Exam: Blood pressure 139/70, pulse 77, temperature 98.2 F (36.8 C), temperature source Tympanic, resp. rate 19, weight (!) 346 lb 5 oz (157.1 kg), last menstrual period 07/14/2001, SpO2 96 %.    ECOG: 0   General appearance: Comfortable appearing without any discomfort Head: Normocephalic without any trauma Oropharynx: Mucous membranes are moist and pink without any thrush or ulcers. Eyes: Pupils are equal and round reactive to light. Lymph nodes: No cervical, supraclavicular, inguinal or axillary lymphadenopathy.   Heart:regular rate and rhythm.  S1 and S2 without leg edema. Lung: Clear without any rhonchi or wheezes.  No dullness to percussion. Abdomin: Soft, nontender, nondistended with good bowel sounds.  No hepatosplenomegaly. Musculoskeletal: No joint deformity or effusion.  Full range of motion noted. Neurological: No deficits noted on motor, sensory and deep tendon reflex exam. Skin: No petechial rash or dryness.  Appeared moist.        Lab Results: Lab Results  Component Value Date   WBC 7.8 04/07/2019   HGB 12.4 04/07/2019   HCT 39.0 04/07/2019   MCV 89.4 04/07/2019   PLT 75 (L) 04/07/2019     Chemistry  Component Value Date/Time   NA 139 07/04/2014 1547   K 3.8 07/04/2014 1547   CL 107 07/04/2014 1547   CO2 25 07/04/2014 1547   BUN 18 07/04/2014 1547   CREATININE 0.8 07/04/2014 1547   CREATININE 1.00 10/28/2012 1611      Component Value Date/Time   CALCIUM 8.8  07/04/2014 1547   ALKPHOS 73 01/15/2013 1633   AST 14 01/15/2013 1633   ALT 10 01/15/2013 1633   BILITOT 0.5 01/15/2013 1633       Impression and Plan:  73 year old woman with:  1. Thrombocytopenia dating back to 2008.  Her platelet count has fluctuated between close to normal range and as low as 60,000.  Laboratory data from today were reviewed and platelet count today 75,000 which is relatively stable and has been fluctuating for close to 12 years.  The differential diagnosis was reviewed which includes immune thrombocytopenia versus early myelodysplasia.  At this time she does not have any increased risk of bleeding and does not require any intervention.  Bone marrow biopsy and a trial of steroids may be needed if she develops worsening platelet count below 40,000.   2. Paroxysmal atrial fibrillation: Her platelet count is adequate and and no contraindication for anticoagulation if needed.  3.  Monocytosis: This has been fluctuating since 2014.  Could be reactive in nature versus signs of early myelodysplasia.  We will continue to monitor at this time.  The role for bone marrow biopsy was reviewed but at this time I see no indication.   4.  Follow-up: In 6 months for repeat evaluation.   25  minutes was spent with the patient face-to-face today.  More than 50% of time was dedicated to discussing the differential diagnosis of her laboratory finding, management options as well as answering questions regarding future plan of care.    Zola Button, MD 9/24/20209:19 AM

## 2019-04-08 ENCOUNTER — Telehealth: Payer: Self-pay | Admitting: Oncology

## 2019-04-08 NOTE — Telephone Encounter (Signed)
Called and spoke with patient. Confirmed date and time  °

## 2019-04-14 DIAGNOSIS — Z23 Encounter for immunization: Secondary | ICD-10-CM | POA: Diagnosis not present

## 2019-05-21 ENCOUNTER — Encounter: Payer: Self-pay | Admitting: Obstetrics and Gynecology

## 2019-05-21 DIAGNOSIS — Z1231 Encounter for screening mammogram for malignant neoplasm of breast: Secondary | ICD-10-CM | POA: Diagnosis not present

## 2019-06-17 ENCOUNTER — Other Ambulatory Visit: Payer: Self-pay | Admitting: Cardiovascular Disease

## 2019-06-27 NOTE — Progress Notes (Signed)
Patient ID: Sally Wise, female   DOB: 1946-04-26, 73 y.o.   MRN: [id number]   [address] [place], [place]  [address] Phone: [phone] Fax:  [phone]  Date:  07/04/2019   Patient ID:  Sally, [name] July 26, 1945, MRN [id number]   PCP:  Daisy Floro, MD  Cardiologist:  [place] Emms  History of Present Illness: FALISA Wise is a 73 y.o. female with history of PAF, morbid obesity, hypothyroidism thrombocytopenia who presents  for f/u  of AF. Has had a few episodes of PAF lasting an hour or two mostly in 2014 and 2015  07/12/14 EF 50-55% normal LA size no valve disease. Myovue done 08/08/15 diaphragmatic attenuation no ischemia EF 72%   Since episodes infrequent using cardizem ASA and pill in pocket flecainide  She has had only 2 episodes in last 5 years  One 12/15 and one 7/14  She is convinced she knows when she is in afib    Daughter has  spina bifida  And had cervix surgery in May which was stressful  Labs reviewed and PLT 59 LDL 101 Discussed not needing statin since she has no vascular disease and diet Rx Alone should suffice     Recent Labs: 04/07/2019: Hemoglobin 12.4  Wt Readings from Last 3 Encounters:  07/04/19 (!) 348 lb 9.6 oz (158.1 kg)  04/07/19 (!) 346 lb 5 oz (157.1 kg)  11/16/18 (!) 347 lb 14.4 oz (157.8 kg)     Past Medical History:  Diagnosis Date  . Hematuria    evaluation with Dr. Etta Grandchild  . Hypothyroidism 06/2010  . Kidney stone 04/2004  . Lichen sclerosus et atrophicus 2/13   biopsy proven  . LVH (left ventricular hypertrophy)   . Morbid obesity (HCC)   . PAF (paroxysmal atrial fibrillation) (HCC)    a. Remote hx, re-established care 01/2013 with Dr. [place] Emms for recurrence. 2D echo 01/2013: mod LVH, EF 55-65%, no RWMA, grade 1 d/d. b. 06/2014: started on Xarelto.   . Thrombocytopenia (HCC)   . Vitamin D deficiency 06/2010  . Wears glasses     Current Outpatient Prescriptions  Medication Sig Dispense Refill  .  Cholecalciferol (VITAMIN D PO) Take 200 Int'l Units by mouth.    Marland Kitchen ibuprofen (ADVIL,MOTRIN) 200 MG tablet Take 200 mg by mouth daily.     Marland Kitchen levothyroxine (SYNTHROID, LEVOTHROID) 112 MCG tablet Take 112 mcg by mouth daily before breakfast.    . diltiazem (CARDIZEM CD) 120 MG 24 hr capsule Take 1 capsule (120 mg total) by mouth daily. 90 capsule 3         No current facility-administered medications for this visit.    Allergies:   Patient has no known allergies.   Social History:  The patient  reports that she has never smoked. She has never used smokeless tobacco. She reports that she does not drink alcohol or use drugs.   Family History:  The patient's family history includes Alzheimer's disease in her father; Diabetes in her brother; Hypertension in her brother and mother; Kidney disease in her mother; Spina bifida in her daughter.   ROS:  Please see the history of present illness.   All other systems reviewed and negative.   PHYSICAL EXAM:  VS:  BP 122/78   Pulse 71   Ht 5\' 8"  (1.727 m)   Wt (!) 348 lb 9.6 oz (158.1 kg)   LMP 07/14/2001   SpO2 99%   BMI 53.00 kg/m  Affect appropriate Overweight white female  HEENT: normal Neck supple with no adenopathy JVP normal no bruits no thyromegaly Lungs clear with no wheezing and good diaphragmatic motion Heart:  S1/S2 no murmur, no rub, gallop or click PMI normal Abdomen: benighn, BS positve, no tenderness, no AAA no bruit.  No HSM or HJR Distal pulses intact with no bruits No edema Neuro non-focal Skin warm and dry No muscular weakness   EKG:  NSR 67bpm, no acute ST-T changes, QTc 482ms   07/06/14  04/11/16 SR rate 57 LAD low voltage  05/08/17 SR rate 72 insignificant Q inferiorly low Precordial voltage from obesity   ASSESSMENT AND PLAN:  1. PAF - CHADVASC 2 infrequent episodes  Continue cardizem   pill in pocket flecainide.   2. Hypothyroidism - TSH recently normal.Labs followed by primary continue current replacement  dose synthroid 3. Morbid obesity - discussed caloric intake and exercise  4. Thrombocytopenia,chronic - ? IT  not a drinker seen by Heme and no further w/u deemed necessary Last       PLT count noted in Epic 75 04/07/19 Normal spleen size on [place] 04/05/18 Chronic dating back to 2008 f/u with Dr Alen Blew Plan for       BM biopsy and trial of sterids should PLTls drop below 40  5. Hyperglycemia - A1c only 5.7 last visit  6. Chest pain  myovue done 08/08/15 normal    F/u with me in a year   Jenkins Rouge

## 2019-07-04 ENCOUNTER — Ambulatory Visit (INDEPENDENT_AMBULATORY_CARE_PROVIDER_SITE_OTHER): Payer: Medicare Other | Admitting: Cardiovascular Disease

## 2019-07-04 ENCOUNTER — Other Ambulatory Visit: Payer: Self-pay

## 2019-07-04 ENCOUNTER — Encounter: Payer: Self-pay | Admitting: Cardiovascular Disease

## 2019-07-04 ENCOUNTER — Encounter (INDEPENDENT_AMBULATORY_CARE_PROVIDER_SITE_OTHER): Payer: Self-pay

## 2019-07-04 VITALS — BP 122/78 | HR 71 | Ht 68.0 in | Wt 348.6 lb

## 2019-07-04 DIAGNOSIS — I480 Paroxysmal atrial fibrillation: Secondary | ICD-10-CM | POA: Diagnosis not present

## 2019-07-04 NOTE — Patient Instructions (Addendum)

## 2019-07-05 NOTE — Addendum Note (Signed)
Addended by: Jacinta Shoe on: 07/05/2019 12:34 PM   Modules accepted: Orders

## 2019-07-18 ENCOUNTER — Other Ambulatory Visit: Payer: Self-pay

## 2019-07-18 NOTE — Progress Notes (Addendum)
74 y.o. G59P2002 Married White or Caucasian Not Hispanic or Latino female here for annual exam.  She would like to discuss her Lichen Sclerosus. She has intermittent vulvar irritation, uses the steroid ointment ~1 x week. She feels some vulvar bumps, not tender or itchy.   She has a known prolapse (grade 2 uterine prolapse with elongated cervix, grade 1 cystocele), not really bothersome. Sexually active without pain. No trouble voiding. She has intermittent GSI, occasional urge incontinence. She wears a pad, she can go a week and not leak at all. Leaks a small amount.   Daughter has spina bifida, patient of Dr Hyacinth Meeker. Just had surgery at [facility] "tacked her cervix up". She has been catheterizing herself since she was 9.     Patient's last menstrual period was 07/14/2001.          Sexually active: Yes.    The current method of family planning is post menopausal status.    Exercising: No.  The patient does not participate in regular exercise at present. Smoker:  no  Health Maintenance: Pap:  01-04-15 WNL 10-28-12 WNL History of abnormal Pap:  no MMG:  02/2019 normal per patient report requested from [place].  BMD:   01-08-11 normal Colonoscopy: 02-06-14 polyps repeat in 3-5 yrs, got a letter from GI doesn't need colonoscopy until 2023.  TDaP:  01/08/11 Gardasil: NA   reports that she has never smoked. She has never used smokeless tobacco. She reports that she does not drink alcohol or use drugs. Daughter lives with her and her husband, works in [place]. Other daughter is here in [place]. No grandchildren.   Past Medical History:  Diagnosis Date  . Hematuria    evaluation with Dr. Etta Grandchild  . Hypothyroidism 06/2010  . Kidney stone 04/2004  . Lichen sclerosus et atrophicus 2/13   biopsy proven  . LVH (left ventricular hypertrophy)   . Morbid obesity (HCC)   . PAF (paroxysmal atrial fibrillation) (HCC)    a. Remote hx, re-established care 01/2013 with Dr. [place] Emms for recurrence. 2D echo 01/2013: mod LVH,  EF 55-65%, no RWMA, grade 1 d/d. b. 06/2014: started on Xarelto.   . Thrombocytopenia (HCC)   . Vitamin D deficiency 06/2010  . Wears glasses     Past Surgical History:  Procedure Laterality Date  . BREAST EXCISIONAL BIOPSY  2010   left--was a vascular lesion  . CARPAL TUNNEL RELEASE Left 08/09/2013   Procedure: LEFT CARPAL TUNNEL RELEASE;  Surgeon: Wyn Forster., MD;  Location: [facility];  Service: Orthopedics;  Laterality: Left;  . CATARACT EXTRACTION    . COLONOSCOPY  04/2003      . DILATION AND CURETTAGE OF UTERUS    . HYSTEROSCOPY WITH D & C  8/99  . 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 TONSILLECTOMY    . TRIGGER FINGER RELEASE Left 08/09/2013   Procedure: LEFT A1/A2 PULLEY RELEASE;  Surgeon: Wyn Forster., MD;  Location: [facility];  Service: Orthopedics;  Laterality: Left;  . TUBAL LIGATION  5/77    Current Outpatient Medications  Medication Sig Dispense Refill  . Cholecalciferol (VITAMIN D3) 5000 UNITS TABS Take 1 tablet by mouth daily.    . clobetasol ointment (TEMOVATE) 0.05 % Apply 1 application topically 2 (two) times daily. Apply as directed twice daily for up to 1-2 weeks as needed. Can use 1-2 x a week on a regular basis as needed. Not for daily long term use. 60  g 0  . diltiazem (CARDIZEM CD) 180 MG 24 hr capsule TAKE 1 CAPSULE BY MOUTH EVERY DAY 90 capsule 0  . flecainide (TAMBOCOR) 150 MG tablet Take 2 tablets (300 mg total) by mouth daily as needed (AFib episode). 15 tablet 1  . levothyroxine (SYNTHROID, LEVOTHROID) 112 MCG tablet Take 112 mcg by mouth daily before breakfast.     No current facility-administered medications for this visit.    Family History  Problem Relation Age of Onset  . Hypertension Mother   . Kidney disease Mother   . Alzheimer's disease Father   . Diabetes Brother   . Hypertension Brother   . Spina bifida Daughter     Review of Systems  All other systems reviewed and are  negative.   Exam:   BP 140/72   Pulse 91   Temp (!) 97 F (36.1 C)   Ht 5\' 8"  (1.727 m)   LMP 07/14/2001   SpO2 96%   BMI 53.00 kg/m   Weight change: @WEIGHTCHANGE @ Height:   Height: 5\' 8"  (172.7 cm)  Ht Readings from Last 3 Encounters:  07/21/19 5\' 8"  (1.727 m)  07/04/19 5\' 8"  (1.727 m)  11/16/18 5\' 8"  (1.727 m)    General appearance: alert, cooperative and appears stated age Head: Normocephalic, without obvious abnormality, atraumatic Neck: no adenopathy, supple, symmetrical, trachea midline and thyroid normal to inspection and palpation Lungs: clear to auscultation bilaterally Cardiovascular: regular rate and rhythm Breasts: normal appearance, no masses or tenderness Abdomen: soft, non-tender; non distended,  no masses,  no organomegaly Extremities: extremities normal, atraumatic, no cyanosis or edema Skin: Skin color, texture, turgor normal. No rashes or lesions Lymph nodes: Cervical, supraclavicular, and axillary nodes normal. No abnormal inguinal nodes palpated Neurologic: Grossly normal   Pelvic: External genitalia:  The labia minora are very small, white, some thickening particularly on the right, some loss of architecture. No other whitening, no fissures, no plaques. This area was biopsied 2 years ago and showed lichen simplex chronicus.               Urethra:  normal appearing urethra with no masses, tenderness or lesions              Bartholins and Skenes: normal                 Vagina: normal appearing vagina with normal color and discharge, no lesions              Cervix: no lesions and elongated               Bimanual Exam:  Uterus:  exam limited by BMI, no masses or tenderness              Adnexa: no mass, fullness, tenderness               Rectovaginal: Confirms               Anus:  normal sphincter tone, no lesions  Perianal skin with some erythema  09/18/19 chaperoned for the exam.  A:  Well Woman with normal exam  Genital prolapse,  tolerable  Mixed incontinence, tolerable  Lichen Sclerosis, some thickening of the right labia minora.     P:   No pap  Mammogram and colonoscopy UTD  Steroid ointment for prn use, will have her use it BID for 2 weeks and return for f/u exam, if not improved will do a biopsy.  Discussed breast self exam  Discussed  calcium and vit D intake  Labs with primary   Addendum: mammogram received, negative done on 05/21/19

## 2019-07-19 ENCOUNTER — Telehealth: Payer: Self-pay

## 2019-07-19 NOTE — Telephone Encounter (Signed)
-----   Message from Benjiman Core, MD sent at 07/19/2019  7:13 AM EST ----- Regarding: RE: Patient would like to know if she can get the COVID vaccination Sure ----- Message ----- From: Gerrit Halls, RN Sent: 07/18/2019  11:04 AM EST To: Benjiman Core, MD Subject: Patient would like to know if she can get th#

## 2019-07-19 NOTE — Telephone Encounter (Signed)
Contacted patient, spoke with spouse, and made aware that she can get the COVID vaccination when offered per Dr. Clelia Croft.

## 2019-07-21 ENCOUNTER — Other Ambulatory Visit: Payer: Self-pay

## 2019-07-21 ENCOUNTER — Ambulatory Visit (INDEPENDENT_AMBULATORY_CARE_PROVIDER_SITE_OTHER): Payer: Medicare Other | Admitting: Obstetrics and Gynecology

## 2019-07-21 ENCOUNTER — Encounter: Payer: Self-pay | Admitting: Obstetrics and Gynecology

## 2019-07-21 VITALS — BP 140/72 | HR 91 | Temp 97.0°F | Ht 68.0 in

## 2019-07-21 DIAGNOSIS — L900 Lichen sclerosus et atrophicus: Secondary | ICD-10-CM | POA: Diagnosis not present

## 2019-07-21 DIAGNOSIS — N3946 Mixed incontinence: Secondary | ICD-10-CM | POA: Diagnosis not present

## 2019-07-21 DIAGNOSIS — N814 Uterovaginal prolapse, unspecified: Secondary | ICD-10-CM

## 2019-07-21 DIAGNOSIS — Z01419 Encounter for gynecological examination (general) (routine) without abnormal findings: Secondary | ICD-10-CM | POA: Diagnosis not present

## 2019-07-21 MED ORDER — CLOBETASOL PROPIONATE 0.05 % EX OINT: 1.0000 "application " | TOPICAL_OINTMENT | Freq: Two times a day (BID) | CUTANEOUS | 0 refills | Status: DC

## 2019-07-21 NOTE — Patient Instructions (Signed)
EXERCISE AND DIET:  We recommended that you start or continue a regular exercise program for good health. Regular exercise means any activity that makes your heart beat faster and makes you sweat.  We recommend exercising at least 30 minutes per day at least 3 days a week, preferably 4 or 5.  We also recommend a diet low in fat and sugar.  Inactivity, poor dietary choices and obesity can cause diabetes, heart attack, stroke, and kidney damage, among others.    ALCOHOL AND SMOKING:  Women should limit their alcohol intake to no more than 7 drinks/beers/glasses of wine (combined, not each!) per week. Moderation of alcohol intake to this level decreases your risk of breast cancer and liver damage. And of course, no recreational drugs are part of a healthy lifestyle.  And absolutely no smoking or even second hand smoke. Most people know smoking can cause heart and lung diseases, but did you know it also contributes to weakening of your bones? Aging of your skin?  Yellowing of your teeth and nails?  CALCIUM AND VITAMIN D:  Adequate intake of calcium and Vitamin D are recommended.  The recommendations for exact amounts of these supplements seem to change often, but generally speaking 1,200 mg of calcium (between diet and supplement) and 800 units of Vitamin D per day seems prudent. Certain women may benefit from higher intake of Vitamin D.  If you are among these women, your doctor will have told you during your visit.    PAP SMEARS:  Pap smears, to check for cervical cancer or precancers,  have traditionally been done yearly, although recent scientific advances have shown that most women can have pap smears less often.  However, every woman still should have a physical exam from her gynecologist every year. It will include a breast check, inspection of the vulva and vagina to check for abnormal growths or skin changes, a visual exam of the cervix, and then an exam to evaluate the size and shape of the uterus and  ovaries.  And after 76 years of age, a rectal exam is indicated to check for rectal cancers. We will also provide age appropriate advice regarding health maintenance, like when you should have certain vaccines, screening for sexually transmitted diseases, bone density testing, colonoscopy, mammograms, etc.   MAMMOGRAMS:  All women over 40 years old should have a yearly mammogram. Many facilities now offer a "3D" mammogram, which may cost around $50 extra out of pocket. If possible,  we recommend you accept the option to have the 3D mammogram performed.  It both reduces the number of women who will be called back for extra views which then turn out to be normal, and it is better than the routine mammogram at detecting truly abnormal areas.    COLON CANCER SCREENING: Now recommend starting at age 45. At this time colonoscopy is not covered for routine screening until 50. There are take home tests that can be done between 45-49.   COLONOSCOPY:  Colonoscopy to screen for colon cancer is recommended for all women at age 50.  We know, you hate the idea of the prep.  We agree, BUT, having colon cancer and not knowing it is worse!!  Colon cancer so often starts as a polyp that can be seen and removed at colonscopy, which can quite literally save your life!  And if your first colonoscopy is normal and you have no family history of colon cancer, most women don't have to have it again for   10 years.  Once every ten years, you can do something that may end up saving your life, right?  We will be happy to help you get it scheduled when you are ready.  Be sure to check your insurance coverage so you understand how much it will cost.  It may be covered as a preventative service at no cost, but you should check your particular policy.      Breast Self-Awareness Breast self-awareness means being familiar with how your breasts look and feel. It involves checking your breasts regularly and reporting any changes to your  health care provider. Practicing breast self-awareness is important. A change in your breasts can be a sign of a serious medical problem. Being familiar with how your breasts look and feel allows you to find any problems early, when treatment is more likely to be successful. All women should practice breast self-awareness, including women who have had breast implants. How to do a breast self-exam One way to learn what is normal for your breasts and whether your breasts are changing is to do a breast self-exam. To do a breast self-exam: Look for Changes  1. Remove all the clothing above your waist. 2. Stand in front of a mirror in a room with good lighting. 3. Put your hands on your hips. 4. Push your hands firmly downward. 5. Compare your breasts in the mirror. Look for differences between them (asymmetry), such as: ? Differences in shape. ? Differences in size. ? Puckers, dips, and bumps in one breast and not the other. 6. Look at each breast for changes in your skin, such as: ? Redness. ? Scaly areas. 7. Look for changes in your nipples, such as: ? Discharge. ? Bleeding. ? Dimpling. ? Redness. ? A change in position. Feel for Changes Carefully feel your breasts for lumps and changes. It is best to do this while lying on your back on the floor and again while sitting or standing in the shower or tub with soapy water on your skin. Feel each breast in the following way:  Place the arm on the side of the breast you are examining above your head.  Feel your breast with the other hand.  Start in the nipple area and make  inch (2 cm) overlapping circles to feel your breast. Use the pads of your three middle fingers to do this. Apply light pressure, then medium pressure, then firm pressure. The light pressure will allow you to feel the tissue closest to the skin. The medium pressure will allow you to feel the tissue that is a little deeper. The firm pressure will allow you to feel the tissue  close to the ribs.  Continue the overlapping circles, moving downward over the breast until you feel your ribs below your breast.  Move one finger-width toward the center of the body. Continue to use the  inch (2 cm) overlapping circles to feel your breast as you move slowly up toward your collarbone.  Continue the up and down exam using all three pressures until you reach your armpit.  Write Down What You Find  Write down what is normal for each breast and any changes that you find. Keep a written record with breast changes or normal findings for each breast. By writing this information down, you do not need to depend only on memory for size, tenderness, or location. Write down where you are in your menstrual cycle, if you are still menstruating. If you are having trouble noticing differences   in your breasts, do not get discouraged. With time you will become more familiar with the variations in your breasts and more comfortable with the exam. How often should I examine my breasts? Examine your breasts every month. If you are breastfeeding, the best time to examine your breasts is after a feeding or after using a breast pump. If you menstruate, the best time to examine your breasts is 5-7 days after your period is over. During your period, your breasts are lumpier, and it may be more difficult to notice changes. When should I see my health care provider? See your health care provider if you notice:  A change in shape or size of your breasts or nipples.  A change in the skin of your breast or nipples, such as a reddened or scaly area.  Unusual discharge from your nipples.  A lump or thick area that was not there before.  Pain in your breasts.  Anything that concerns you.  

## 2019-08-02 ENCOUNTER — Ambulatory Visit: Payer: Medicare Other | Attending: Internal Medicine

## 2019-08-02 DIAGNOSIS — Z23 Encounter for immunization: Secondary | ICD-10-CM

## 2019-08-02 NOTE — Progress Notes (Signed)
   Covid-19 Vaccination Clinic  Name:  HARLAN VINAL    MRN: [id number] DOB: 14-May-1946  08/02/2019  Ms. Guillot was observed post Covid-19 immunization for 15 minutes without incidence. She was provided with Vaccine Information Sheet and instruction to access the V-Safe system.   Ms. Carberry was instructed to call 911 with any severe reactions post vaccine: Marland Kitchen Difficulty breathing  . Swelling of your face and throat  . A fast heartbeat  . A bad rash all over your body  . Dizziness and weakness    Immunizations Administered    Name Date Dose VIS Date Route   Pfizer COVID-19 Vaccine 08/02/2019 11:44 AM 0.3 mL 06/24/2019 Intramuscular   Manufacturer: [employer], [employer]   Lot: [id number]   NDC: [address]-7

## 2019-08-05 ENCOUNTER — Ambulatory Visit (INDEPENDENT_AMBULATORY_CARE_PROVIDER_SITE_OTHER): Payer: Medicare Other | Admitting: Obstetrics and Gynecology

## 2019-08-05 ENCOUNTER — Encounter: Payer: Self-pay | Admitting: Obstetrics and Gynecology

## 2019-08-05 ENCOUNTER — Other Ambulatory Visit: Payer: Self-pay

## 2019-08-05 VITALS — BP 130/70 | HR 62 | Temp 97.2°F | Ht 68.0 in

## 2019-08-05 DIAGNOSIS — L900 Lichen sclerosus et atrophicus: Secondary | ICD-10-CM | POA: Diagnosis not present

## 2019-08-05 NOTE — Progress Notes (Signed)
GYNECOLOGY  VISIT   HPI: 74 y.o.   Married White or Caucasian Not Hispanic or Latino  female   [phone] with Patient's last menstrual period was 07/14/2001.   here for Recheck of vulvar skin. At her annual exam earlier this month she was noted to have some thickening of her right labia minora (with whitening bilaterally). She has been using the steroid ointment  BID for the last 2 weeks. She denies itching or irritation.   GYNECOLOGIC HISTORY: Patient's last menstrual period was 07/14/2001. Contraception:none  Menopausal hormone therapy: none         OB History    Gravida  2   Para  2   Term  2   Preterm  0   AB  0   Living  2     SAB  0   TAB  0   Ectopic  0   Multiple  0   Live Births  2              Patient Active Problem List   Diagnosis Date Noted  . 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 Lichen sclerosus et atrophicus 01/04/2015  . LVH (left ventricular hypertrophy)   . PAF (paroxysmal atrial fibrillation) (HCC) 07/06/2014  . Thrombocytopenia (HCC) 07/06/2014  . Hyperglycemia 07/06/2014  . Morbid obesity (HCC) 01/25/2013  . Hypothyroidism 01/15/2013  . History of total knee replacement, left 08/09/2007    Past Medical History:  Diagnosis Date  . Hematuria    evaluation with Dr. Etta Grandchild  . Hypothyroidism 06/2010  . Kidney stone 04/2004  . Lichen sclerosus et atrophicus 2/13   biopsy proven  . LVH (left ventricular hypertrophy)   . Morbid obesity (HCC)   . PAF (paroxysmal atrial fibrillation) (HCC)    a. Remote hx, re-established care 01/2013 with Dr. Eden Emms for recurrence. 2D echo 01/2013: mod LVH, EF 55-65%, no RWMA, grade 1 d/d. b. 06/2014: started on Xarelto.   . Thrombocytopenia (HCC)   . Vitamin D deficiency 06/2010  . Wears glasses     Past Surgical History:  Procedure Laterality Date  . BREAST EXCISIONAL BIOPSY  2010   left--was a vascular lesion  . CARPAL TUNNEL RELEASE Left 08/09/2013   Procedure: LEFT CARPAL TUNNEL  RELEASE;  Surgeon: Wyn Forster., MD;  Location: [facility];  Service: Orthopedics;  Laterality: Left;  . CATARACT EXTRACTION    . COLONOSCOPY  04/2003      . DILATION AND CURETTAGE OF UTERUS    . HYSTEROSCOPY WITH D & C  8/99  . 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 TONSILLECTOMY    . TRIGGER FINGER RELEASE Left 08/09/2013   Procedure: LEFT A1/A2 PULLEY RELEASE;  Surgeon: Wyn Forster., MD;  Location:  SUR[facility]ERY CENTER;  Service: Orthopedics;  Laterality: Left;  . TUBAL LIGATION  5/77    Current Outpatient Medications  Medication Sig Dispense Refill  . Cholecalciferol (VITAMIN D3) 5000 UNITS TABS Take 1 tablet by mouth daily.    . clobetasol ointment (TEMOVATE) 0.05 % Apply 1 application topically 2 (two) times daily. Apply as directed twice daily for up to 1-2 weeks as needed. Can use 1-2 x a week on a regular basis as needed. Not for daily long term use. 60 g 0  . diltiazem (CARDIZEM CD) 180 MG 24 hr capsule TAKE 1 CAPSULE BY MOUTH EVERY DAY 90 capsule 0  . flecainide (TAMBOCOR) 150 MG tablet Take 2  tablets (300 mg total) by mouth daily as needed (AFib episode). 15 tablet 1  . levothyroxine (SYNTHROID, LEVOTHROID) 112 MCG tablet Take 112 mcg by mouth daily before breakfast.     No current facility-administered medications for this visit.     ALLERGIES: Patient has no known allergies.  Family History  Problem Relation Age of Onset  . Hypertension Mother   . Kidney disease Mother   . Alzheimer's disease Father   . Diabetes Brother   . Hypertension Brother   . Spina bifida Daughter     Social History   Socioeconomic History  . Marital status: Married    Spouse name: Not on file  . Number of children: Not on file  . Years of education: Not on file  . Highest education level: Not on file  Occupational History  . Not on file  Tobacco Use  . Smoking status: Never Smoker  . Smokeless tobacco: Never Used  Substance and Sexual  Activity  . Alcohol use: No  . Drug use: No  . Sexual activity: Yes    Partners: Male    Birth control/protection: Surgical  Other Topics Concern  . Not on file  Social History Narrative  . Not on file   Social Determinants of Health   Financial Resource Strain:   . Difficulty of Paying Living Expenses: Not on file  Food Insecurity:   . Worried About Programme researcher, broadcasting/film/video in the Last Year: Not on file  . Ran Out of Food in the Last Year: Not on file  Transportation Needs:   . Lack of Transportation (Medical): Not on file  . Lack of Transportation (Non-Medical): Not on file  Physical Activity:   . Days of Exercise per Week: Not on file  . Minutes of Exercise per Session: Not on file  Stress:   . Feeling of Stress : Not on file  Social Connections:   . Frequency of Communication with Friends and Family: Not on file  . Frequency of Social Gatherings with Friends and Family: Not on file  . Attends Religious Services: Not on file  . Active Member of Clubs or Organizations: Not on file  . Attends Banker Meetings: Not on file  . Marital Status: Not on file  Intimate Partner Violence:   . Fear of Current or Ex-Partner: Not on file  . Emotionally Abused: Not on file  . Physically Abused: Not on file  . Sexually Abused: Not on file    Review of Systems  All other systems reviewed and are negative.   PHYSICAL EXAMINATION:    BP 130/70   Pulse 62   Temp (!) 97.2 F (36.2 C)   Ht 5\' 8"  (1.727 m)   LMP 07/14/2001   SpO2 96%   BMI 53.00 kg/m     General appearance: alert, cooperative and appears stated age  Pelvic: External genitalia:  Labia minora are small, white, less white and less thickening on the right than noted 2 weeks ago. Some loss or architecture. No fissures, plaques or lesions. Prior biopsy in this area with lichen simplex chronicus.               Urethra:  normal appearing urethra with no masses, tenderness or lesions              Bartholins and  Skenes: normal                  Chaperone was present for exam.  ASSESSMENT  H/O lichen sclerosis and lichen simplex chronicus. S/P 2 weeks of BID steroid ointment for whitening and thickening of the labia minora (particularly on the right). Exam has improved. Patient without symptoms.    PLAN Use the steroid ointment 2 x a week Discussed vulvar skin care F/U in 6 months   An After Visit Summary was printed and given to the patient.  ~15 minutes in patient care.

## 2019-08-23 ENCOUNTER — Ambulatory Visit: Payer: BC Managed Care – PPO | Attending: Internal Medicine

## 2019-08-23 DIAGNOSIS — Z23 Encounter for immunization: Secondary | ICD-10-CM | POA: Insufficient documentation

## 2019-08-23 NOTE — Progress Notes (Signed)
   Covid-19 Vaccination Clinic  Name:  ALYSSHA HOUSH    MRN: [id number] DOB: 1946-07-12  08/23/2019  Ms. Hegg was observed post Covid-19 immunization for 15 minutes without incidence. She was provided with Vaccine Information Sheet and instruction to access the V-Safe system.   Ms. Sebek was instructed to call 911 with any severe reactions post vaccine: Marland Kitchen Difficulty breathing  . Swelling of your face and throat  . A fast heartbeat  . A bad rash all over your body  . Dizziness and weakness    Immunizations Administered    Name Date Dose VIS Date Route   Pfizer COVID-19 Vaccine 08/23/2019 11:34 AM 0.3 mL 06/24/2019 Intramuscular   Manufacturer: [employer], [employer]   Lot: [id number]   NDC: [address]-4

## 2019-09-12 ENCOUNTER — Other Ambulatory Visit: Payer: Self-pay | Admitting: Cardiovascular Disease

## 2019-09-19 DIAGNOSIS — E039 Hypothyroidism, unspecified: Secondary | ICD-10-CM | POA: Diagnosis not present

## 2019-10-04 ENCOUNTER — Inpatient Hospital Stay (HOSPITAL_BASED_OUTPATIENT_CLINIC_OR_DEPARTMENT_OTHER): Payer: Medicare Other | Admitting: Oncology

## 2019-10-04 ENCOUNTER — Other Ambulatory Visit: Payer: Self-pay

## 2019-10-04 ENCOUNTER — Inpatient Hospital Stay: Payer: Medicare Other | Attending: Oncology

## 2019-10-04 VITALS — BP 158/75 | HR 70 | Temp 98.0°F | Resp 22 | Wt 353.8 lb

## 2019-10-04 DIAGNOSIS — Z79899 Other long term (current) drug therapy: Secondary | ICD-10-CM | POA: Diagnosis not present

## 2019-10-04 DIAGNOSIS — I480 Paroxysmal atrial fibrillation: Secondary | ICD-10-CM | POA: Diagnosis not present

## 2019-10-04 DIAGNOSIS — D72821 Monocytosis (symptomatic): Secondary | ICD-10-CM | POA: Diagnosis not present

## 2019-10-04 DIAGNOSIS — D696 Thrombocytopenia, unspecified: Secondary | ICD-10-CM | POA: Diagnosis not present

## 2019-10-04 LAB — CBC WITH DIFFERENTIAL (CANCER CENTER ONLY)
Abs Immature Granulocytes: 0.04 10*3/uL (ref 0.00–0.07)
Basophils Absolute: 0 10*3/uL (ref 0.0–0.1)
Basophils Relative: 0 %
Eosinophils Absolute: 0 10*3/uL (ref 0.0–0.5)
Eosinophils Relative: 1 %
HCT: 37.4 % (ref 36.0–46.0)
Hemoglobin: 11.7 g/dL — ABNORMAL LOW (ref 12.0–15.0)
Immature Granulocytes: 1 %
Lymphocytes Relative: 30 %
Lymphs Abs: 2.1 10*3/uL (ref 0.7–4.0)
MCH: 28.4 pg (ref 26.0–34.0)
MCHC: 31.3 g/dL (ref 30.0–36.0)
MCV: 90.8 fL (ref 80.0–100.0)
Monocytes Absolute: 1.6 10*3/uL — ABNORMAL HIGH (ref 0.1–1.0)
Monocytes Relative: 24 %
Neutro Abs: 3 10*3/uL (ref 1.7–7.7)
Neutrophils Relative %: 44 %
Platelet Count: 77 10*3/uL — ABNORMAL LOW (ref 150–400)
RBC: 4.12 MIL/uL (ref 3.87–5.11)
RDW: 16.1 % — ABNORMAL HIGH (ref 11.5–15.5)
WBC Count: 6.8 10*3/uL (ref 4.0–10.5)
nRBC: 0 % (ref 0.0–0.2)

## 2019-10-04 NOTE — Progress Notes (Signed)
Hematology and Oncology Follow Up Visit  QUANASIA DEFINO [id number] 1946/01/13 74 y.o. 10/04/2019 9:01 AM [name], MDRoss, Dwyane Luo, MD   Principle Diagnosis: 74 year old woman with thrombocytopenia diagnosed in 2008.  She was found to have platelet count ranging between 7200 with monocytosis.  Differential diagnosis including ITP versus early myelodysplasia.     Current therapy: Active surveillance.  Interim History: Ms. Vaca is here for return evaluation.  Since her last visit, she reports no major changes or constitutional symptoms.  She denies any excessive fatigue tiredness or bleeding.  She continues to have periodic bruising on her arm and is rather spontaneous.  She denies any epistaxis or hematochezia.  Performance status quality of life remained unchanged.      Medications: Reviewed without changes. Current Outpatient Medications  Medication Sig Dispense Refill  . Cholecalciferol (VITAMIN D3) 5000 UNITS TABS Take 1 tablet by mouth daily.    . clobetasol ointment (TEMOVATE) 6.29 % Apply 1 application topically 2 (two) times daily. Apply as directed twice daily for up to 1-2 weeks as needed. Can use 1-2 x a week on a regular basis as needed. Not for daily long term use. 60 g 0  . diltiazem (CARDIZEM CD) 180 MG 24 hr capsule TAKE 1 CAPSULE BY MOUTH EVERY DAY 90 capsule 3  . levothyroxine (SYNTHROID, LEVOTHROID) 112 MCG tablet Take 112 mcg by mouth daily before breakfast.    . flecainide (TAMBOCOR) 150 MG tablet Take 2 tablets (300 mg total) by mouth daily as needed (AFib episode). (Patient not taking: Reported on 10/04/2019) 15 tablet 1   No current facility-administered medications for this visit.     Allergies: No Known Allergies      Physical Exam: Blood pressure (!) 158/75, pulse 70, temperature 98 F (36.7 C), temperature source Temporal, resp. rate (!) 22, weight (!) 353 lb 12.8 oz (160.5 kg), last menstrual period 07/14/2001, SpO2 97 %.    ECOG:  0    General appearance: Alert, awake without any distress. Head: Atraumatic without abnormalities Oropharynx: Without any thrush or ulcers. Eyes: No scleral icterus. Lymph nodes: No lymphadenopathy noted in the cervical, supraclavicular, or axillary nodes Heart:regular rate and rhythm, without any murmurs or gallops.   Lung: Clear to auscultation without any rhonchi, wheezes or dullness to percussion. Abdomin: Soft, nontender without any shifting dullness or ascites. Musculoskeletal: No clubbing or cyanosis. Neurological: No motor or sensory deficits. Skin: Very faint ecchymosis noted on her upper extremities bilaterally.        Lab Results: Lab Results  Component Value Date   WBC PENDING 10/04/2019   HGB 11.7 (L) 10/04/2019   HCT 37.4 10/04/2019   MCV 90.8 10/04/2019   PLT 77 (L) 10/04/2019     Chemistry      Component Value Date/Time   NA 139 07/04/2014 1547   K 3.8 07/04/2014 1547   CL 107 07/04/2014 1547   CO2 25 07/04/2014 1547   BUN 18 07/04/2014 1547   CREATININE 0.8 07/04/2014 1547   CREATININE 1.00 10/28/2012 1611      Component Value Date/Time   CALCIUM 8.8 07/04/2014 1547   ALKPHOS 73 01/15/2013 1633   AST 14 01/15/2013 1633   ALT 10 01/15/2013 1633   BILITOT 0.5 01/15/2013 1633       Impression and Plan:  74 year old woman with:  1. Thrombocytopenia with counts ranging between 60 and 100 since 2008.  Differential diagnosis was updated today which includes ITP versus early myelodysplasia..  The natural  course of these findings were reviewed today.  Management options were also discussed.  Follow count today is 77,000 which he is stable over the last 2 years and have fluctuated below that previously.  Management options were reviewed at this time.  She does not have any active bleeding and platelet count is adequate.  I recommended continued active surveillance at this time.   2. Paroxysmal atrial fibrillation: She is in normal sinus rhythm  at this time and not on anticoagulation.  3.  Monocytosis: Chronic in nature but could be an early sign of myelodysplastic syndrome.  Bone marrow biopsy would be considered if other cytopenias develop in the future.   4.  Follow-up: In 6 months which would be sooner if needed.   30  minutes were dedicated to this visit. The time was spent on reviewing laboratory data, discussing treatment options, discussing differential diagnosis and answering questions regarding future plan.     Zola Button, MD 3/23/20219:01 AM

## 2019-10-05 ENCOUNTER — Other Ambulatory Visit: Payer: Medicare Other

## 2019-10-05 ENCOUNTER — Ambulatory Visit: Payer: Medicare Other | Admitting: Oncology

## 2019-10-20 DIAGNOSIS — Z0120 Encounter for dental examination and cleaning without abnormal findings: Secondary | ICD-10-CM | POA: Diagnosis not present

## 2019-12-09 DIAGNOSIS — Z6841 Body Mass Index (BMI) 40.0 and over, adult: Secondary | ICD-10-CM | POA: Diagnosis not present

## 2019-12-09 DIAGNOSIS — M545 Low back pain: Secondary | ICD-10-CM | POA: Diagnosis not present

## 2019-12-12 DIAGNOSIS — M545 Low back pain, unspecified: Secondary | ICD-10-CM | POA: Insufficient documentation

## 2019-12-13 DIAGNOSIS — M545 Low back pain: Secondary | ICD-10-CM | POA: Diagnosis not present

## 2019-12-13 DIAGNOSIS — M533 Sacrococcygeal disorders, not elsewhere classified: Secondary | ICD-10-CM | POA: Diagnosis not present

## 2019-12-13 DIAGNOSIS — Z6841 Body Mass Index (BMI) 40.0 and over, adult: Secondary | ICD-10-CM | POA: Diagnosis not present

## 2019-12-13 DIAGNOSIS — M1611 Unilateral primary osteoarthritis, right hip: Secondary | ICD-10-CM | POA: Diagnosis not present

## 2019-12-15 DIAGNOSIS — M545 Low back pain: Secondary | ICD-10-CM | POA: Diagnosis not present

## 2019-12-21 DIAGNOSIS — M545 Low back pain: Secondary | ICD-10-CM | POA: Diagnosis not present

## 2019-12-28 DIAGNOSIS — M48062 Spinal stenosis, lumbar region with neurogenic claudication: Secondary | ICD-10-CM | POA: Diagnosis not present

## 2019-12-28 DIAGNOSIS — M5136 Other intervertebral disc degeneration, lumbar region: Secondary | ICD-10-CM | POA: Diagnosis not present

## 2019-12-29 ENCOUNTER — Encounter: Payer: Self-pay | Admitting: Physical Therapy

## 2019-12-29 ENCOUNTER — Other Ambulatory Visit: Payer: Self-pay

## 2019-12-29 ENCOUNTER — Ambulatory Visit: Payer: Medicare Other | Attending: Physical Medicine and Rehabilitation | Admitting: Physical Therapy

## 2019-12-29 DIAGNOSIS — M6281 Muscle weakness (generalized): Secondary | ICD-10-CM | POA: Diagnosis not present

## 2019-12-29 DIAGNOSIS — M545 Low back pain, unspecified: Secondary | ICD-10-CM

## 2019-12-29 DIAGNOSIS — R2689 Other abnormalities of gait and mobility: Secondary | ICD-10-CM | POA: Diagnosis not present

## 2019-12-30 NOTE — Therapy (Signed)
[facility] [address] [place], [place], [address] Phone: [phone]   Fax:  [phone]  Physical Therapy Evaluation  Patient Details  Name: Sally Wise MRN: [id number] Date of Birth: 12-09-1945 Referring Provider (PT): Sheran Luz, MD   Encounter Date: 12/29/2019   PT End of Session - 12/29/19 1731    Visit Number 1    Date for PT Re-Evaluation 02/28/20    Authorization Type [employer]    Authorization Time Period 12/29/19 to 03/30/20    PT Start Time 1445    PT Stop Time 1530    PT Time Calculation (min) 45 min    Activity Tolerance Treatment limited secondary to medical complications (Comment)    Behavior During Therapy [facility] for tasks assessed/performed           Past Medical History:  Diagnosis Date  . Hematuria    evaluation with Dr. Etta Grandchild  . Hypothyroidism 06/2010  . Kidney stone 04/2004  . Lichen sclerosus et atrophicus 2/13   biopsy proven  . LVH (left ventricular hypertrophy)   . Morbid obesity (HCC)   . PAF (paroxysmal atrial fibrillation) (HCC)    a. Remote hx, re-established care 01/2013 with Dr. Eden Emms for recurrence. 2D echo 01/2013: mod LVH, EF 55-65%, no RWMA, grade 1 d/d. b. 06/2014: started on Xarelto.   . Thrombocytopenia (HCC)   . Vitamin D deficiency 06/2010  . Wears glasses     Past Surgical History:  Procedure Laterality Date  . BREAST EXCISIONAL BIOPSY  2010   left--was a vascular lesion  . CARPAL TUNNEL RELEASE Left 08/09/2013   Procedure: LEFT CARPAL TUNNEL RELEASE;  Surgeon: Wyn Forster., MD;  Location: [facility];  Service: Orthopedics;  Laterality: Left;  . CATARACT EXTRACTION    . COLONOSCOPY  04/2003      . DILATION AND CURETTAGE OF UTERUS    . HYSTEROSCOPY WITH D & C  8/99  . 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 TONSILLECTOMY    . TRIGGER FINGER RELEASE Left 08/09/2013   Procedure: LEFT A1/A2 PULLEY RELEASE;  Surgeon: Wyn Forster., MD;  Location: [facility];  Service: Orthopedics;  Laterality: Left;  . TUBAL LIGATION  5/77    There were no vitals filed for this visit.    Subjective Assessment - 12/29/19 1448    Subjective Pt has had increase in Rt LE weakness in the last 3 weeks. She had an MRI and was told there wasn't anything that stood out. She has been having Rt knee stinging following her fall in the shower. She is unable to get up when she she fell and had to call EMS. She mostly has Rt side low back/buttock pain when laying on her back. Standing is difficult becasue her leg wants to give out on her.    Pertinent History B TKA, thromboctopenia, morbid obesity    How long can you sit comfortably? unlimited    How long can you stand comfortably? a few minutes due to Rt LE fatigue    How long can you walk comfortably? short indoor distances only    Diagnostic tests MRI: per pt MD states "nothing stood out"    Patient Stated Goals find comfort when sitting    Currently in Pain? Yes    Pain Score 2     Pain Location Back    Pain Orientation Right;Posterior    Pain Descriptors / Indicators  Nagging    Pain Type Acute pain    Pain Radiating Towards none    Pain Onset 1 to 4 weeks ago    Pain Frequency Constant    Aggravating Factors  sleeping on the Rt side    Pain Relieving Factors standing/walking/sitting    Effect of Pain on Daily Activities limited independence with activity              [facility] PT Assessment - 12/30/19 0001      Assessment   Medical Diagnosis Lumbar spinal stenosis    Referring Provider (PT) Sheran Luz, MD    Next MD Visit none as of now       Precautions   Precautions None      Restrictions   Weight Bearing Restrictions No      Home Environment   Living Environment Private residence    Additional Comments stairs to get into the home       Cognition   Overall Cognitive Status Within Functional Limits for tasks assessed      Sensation    Additional Comments denies numbness and tingling       Strength   Overall Strength Comments unable to assess hip extension due to mobility restrictions; Rt hip flexion 2+/5, hip abduction 3/5 MMT       Palpation   Palpation comment tenderness at Rt gluteals      Bed Mobility   Bed Mobility Sit to Supine;Supine to Sit    Supine to Sit Moderate Assistance - Patient 50-74%    Sit to Supine Moderate Assistance - Patient 50-74%   Rt LE assist     Transfers   Five time sit to stand comments  10 sec, table elevated- unable from standard chair       Ambulation/Gait   Gait Comments ambulating with RW                      Objective measurements completed on examination: See above findings.       [facility] Adult PT Treatment/Exercise - 12/30/19 0001      Exercises   Exercises Lumbar      Lumbar Exercises: Seated   Other Seated Lumbar Exercises adductor ball squeeze HEP demo x5 reps                   PT Education - 12/29/19 1730    Education Details eval findings/POC; benefits of medical equipment for the home to help with showering/toileting/etc    Person(s) Educated Patient    Methods Explanation    Comprehension Verbalized understanding            PT Short Term Goals - 12/29/19 1742      PT SHORT TERM GOAL #1   Title Pt will be independent with her initial HEP.    Time 4    Period Weeks    Status New             PT Long Term Goals - 12/29/19 1743      PT LONG TERM GOAL #1   Title Pt will be able to complete sit to/from supine transition with no more than CGA for safety.    Time 8    Period Weeks    Status New      PT LONG TERM GOAL #2   Title Pt will be able to complete sit to stand x5 reps from standard chair using arm rests.    Time 8  Period Weeks    Status New      PT LONG TERM GOAL #3   Title Pt will have Rt hip flexion strength of atleast 4/5 MMT.    Time 8    Period Weeks    Status New      PT LONG TERM GOAL #4   Title Pt  will be able to step up onto 6" step x5 reps with no more than 1 handrail support.    Time 8    Period Weeks    Status New      PT LONG TERM GOAL #5   Title Pt will report atleast 20% improvement in her pain from the start of PT.    Time 8    Period Weeks                  Plan - 12/29/19 1733    Clinical Impression Statement Pt is a pleasant 74 y.o F referred to OPPT with concerns of Rt LE weakness/heaviness onset within the last 3 weeks. She has had Rt sided low back/buttock pain prior to this. Pt currently has significant difficulty with getting up from a standard chair/toilet and recently fell in the shower resulting in intermittent Rt knee discomfort. During today's evaluation, pt requires moderate assistance with getting the Rt LE onto the mat table during transitions to supine. She can complete sit to stand from an elevated surface with 2 foam pads. Palpation is limited but there is some tenderness in the Rt gluteal region primarily. Pt denies numbness and tingling. She would greatly benefit from skilled PT to increase her strength, endurance and promote her independence with ADLs.    Personal Factors and Comorbidities Age;Comorbidity 1    Comorbidities morbid obesity    Examination-Activity Limitations Transfers;Toileting;Squat;Sleep;Bed Mobility;Stairs    Stability/Clinical Decision Making Unstable/Unpredictable    Clinical Decision Making High    Rehab Potential Good    PT Frequency 2x / week    PT Duration 8 weeks    PT Treatment/Interventions ADLs/Self Care Home Management;Aquatic Therapy;Electrical Stimulation;Cryotherapy;Moist Heat;DME Instruction;Stair training;Functional mobility training;Therapeutic activities;Neuromuscular re-education;Therapeutic exercise;Patient/family education;Balance training;Manual techniques;Dry needling;Passive range of motion    PT Next Visit Plan consider aquatic program; Rt hip flexor strength progression; sit to stand from elevated chair  (gradually working on decreasing this); trunk flexibility in sitting    PT Home Exercise Plan Access Code [email or web address]    Recommended Other Services aquatic program if pt agreeable    Consulted and Agree with Plan of Care Patient           Patient will benefit from skilled therapeutic intervention in order to improve the following deficits and impairments:  Decreased activity tolerance, Decreased range of motion, Decreased strength, Impaired flexibility, Obesity, Pain, Difficulty walking, Decreased mobility, Decreased balance, Decreased endurance  Visit Diagnosis: Right low back pain, unspecified chronicity, unspecified whether sciatica present  Muscle weakness (generalized)  Other abnormalities of gait and mobility     Problem List Patient Active Problem List   Diagnosis Date Noted  . 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 Lichen sclerosus et atrophicus 01/04/2015  . LVH (left ventricular hypertrophy)   . PAF (paroxysmal atrial fibrillation) ([place]) 07/06/2014  . Thrombocytopenia ([place]) 07/06/2014  . Hyperglycemia 07/06/2014  . Morbid obesity ([place]) 01/25/2013  . Hypothyroidism 01/15/2013  . History of total knee replacement, left 08/09/2007   3:10 PM,12/30/19 Sherol Dade PT, DPT [facility] [address], [address] [place], [place], [address] Phone: [phone]   Fax:  [phone]  Name: Sally Wise MRN: [id number] Date of Birth: 1945/10/11

## 2020-01-03 ENCOUNTER — Other Ambulatory Visit: Payer: Self-pay

## 2020-01-03 ENCOUNTER — Ambulatory Visit: Payer: Medicare Other | Admitting: Physical Therapy

## 2020-01-03 ENCOUNTER — Encounter: Payer: Self-pay | Admitting: Physical Therapy

## 2020-01-03 DIAGNOSIS — M6281 Muscle weakness (generalized): Secondary | ICD-10-CM

## 2020-01-03 DIAGNOSIS — M545 Low back pain, unspecified: Secondary | ICD-10-CM

## 2020-01-03 DIAGNOSIS — R2689 Other abnormalities of gait and mobility: Secondary | ICD-10-CM

## 2020-01-03 NOTE — Therapy (Signed)
[facility] [address] [place], [place], [address] Phone: [phone]   Fax:  [phone]  Physical Therapy Treatment  Patient Details  Name: Sally Wise MRN: [id number] Date of Birth: 03/08/1946 Referring Provider (PT): Suella Broad, MD   Encounter Date: 01/03/2020   PT End of Session - 01/03/20 1019    Visit Number 2    Date for PT Re-Evaluation 02/28/20    Authorization Type BCBS    Authorization Time Period 12/29/19 to 03/30/20    PT Start Time 1019    PT Stop Time 1102    PT Time Calculation (min) 43 min    Activity Tolerance Patient tolerated treatment well    Behavior During Therapy [facility] for tasks assessed/performed;Anxious           Past Medical History:  Diagnosis Date   Hematuria    evaluation with Dr. Joelyn Oms   Hypothyroidism 06/2010   Kidney stone 83/6629   Lichen sclerosus et atrophicus 2/13   biopsy proven   LVH (left ventricular hypertrophy)    Morbid obesity (HCC)    PAF (paroxysmal atrial fibrillation) ([place])    a. Remote hx, re-established care 01/2013 with Dr. Johnsie Cancel for recurrence. 2D echo 01/2013: mod LVH, EF 55-65%, no RWMA, grade 1 d/d. b. 06/2014: started on Xarelto.    Thrombocytopenia ([place])    Vitamin D deficiency 06/2010   Wears glasses     Past Surgical History:  Procedure Laterality Date   BREAST EXCISIONAL BIOPSY  2010   left--was a vascular lesion   CARPAL TUNNEL RELEASE Left 08/09/2013   Procedure: LEFT CARPAL TUNNEL RELEASE;  Surgeon: Cammie Sickle., MD;  Location: [place];  Service: Orthopedics;  Laterality: Left;   CATARACT EXTRACTION     COLONOSCOPY  04/2003       DILATION AND CURETTAGE OF UTERUS     HYSTEROSCOPY WITH D & C  8/99   REPLACEMENT TOTAL KNEE Bilateral 12/2006  07/2007    TONSILLECTOMY     TRIGGER FINGER RELEASE Left 08/09/2013   Procedure: LEFT A1/A2 PULLEY RELEASE;  Surgeon: Cammie Sickle., MD;  Location:  [place];  Service: Orthopedics;  Laterality: Left;   TUBAL LIGATION  5/77    There were no vitals filed for this visit.   Subjective Assessment - 01/03/20 1019    Subjective I'm frustrated. Seeing neurosurgeon tomorrow.    Patient Stated Goals find comfort when sitting    Currently in Pain? Yes    Pain Score 2     Pain Location Buttocks    Pain Orientation Right    Pain Descriptors / Indicators Nagging    Pain Type Acute pain                             [facility] Adult PT Treatment/Exercise - 01/03/20 0001      Self-Care   Self-Care Other Self-Care Comments    Other Self-Care Comments  lengthy discussion of possible causes for her condition and outcomes; also see Education      Lumbar Exercises: Stretches   Piriformis Stretch Limitations attempted modified pigeon to right on mat table, but pt didn't feel much stretch      Lumbar Exercises: Standing   Other Standing Lumbar Exercises toe taps to 6 inch step 1x10, 1x5 bil;  hip ABD/EXT 1x10 bil      Lumbar Exercises: Seated   Sit  to Stand 10 reps    Sit to Stand Limitations from mat table + 1 foam; 2x5 no UE support; cues to squeeze buttocks and extend trunk      Manual Therapy   Manual Therapy Soft tissue mobilization;Myofascial release    Manual therapy comments in left [facility]    Soft tissue mobilization to right gluteals and pirifomis    Myofascial Release TPR to right gluteals                  PT Education - 01/03/20 1105    Education Details discussed trying aquatic therapy and benefits; self-mfr with ball for right hip; sit to stand 2x/day    Person(s) Educated Patient    Methods Explanation    Comprehension Verbalized understanding;Returned demonstration            PT Short Term Goals - 12/29/19 1742      PT SHORT TERM GOAL #1   Title Pt will be independent with her initial HEP.    Time 4    Period Weeks    Status New             PT Long Term Goals - 12/29/19  1743      PT LONG TERM GOAL #1   Title Pt will be able to complete sit to/from supine transition with no more than CGA for safety.    Time 8    Period Weeks    Status New      PT LONG TERM GOAL #2   Title Pt will be able to complete sit to stand x5 reps from standard chair using arm rests.    Time 8    Period Weeks    Status New      PT LONG TERM GOAL #3   Title Pt will have Rt hip flexion strength of atleast 4/5 MMT.    Time 8    Period Weeks    Status New      PT LONG TERM GOAL #4   Title Pt will be able to step up onto 6" step x5 reps with no more than 1 handrail support.    Time 8    Period Weeks    Status New      PT LONG TERM GOAL #5   Title Pt will report atleast 20% improvement in her pain from the start of PT.    Time 8    Period Weeks                 Plan - 01/03/20 1107    Clinical Impression Statement Patient presents with reports of being frustrated and concerned that that there is no hope for her condition. After discussion, patient felt encouraged and is willing to move forward with treatment. She tolerated standing exercises fairly well with some c/o of pain in right buttock with SLS on Rt. She was very tender and tight in the right gluteals and proximal ITB but was able to tolerate fairly deep tissue work. She may benefit from DN to these areas.    Personal Factors and Comorbidities Age;Comorbidity 1    Comorbidities morbid obesity    Examination-Activity Limitations Transfers;Toileting;Squat;Sleep;Bed Mobility;Stairs    PT Frequency 2x / week    PT Duration 8 weeks    PT Treatment/Interventions ADLs/Self Care Home Management;Aquatic Therapy;Electrical Stimulation;Cryotherapy;Moist Heat;DME Instruction;Stair training;Functional mobility training;Therapeutic activities;Neuromuscular re-education;Therapeutic exercise;Patient/family education;Balance training;Manual techniques;Dry needling;Passive range of motion    PT Next Visit Plan continue to  encourage aquatic  program; STW/DN to right gluteals/lumbar; Rt hip flexor strength progression; sit to stand from elevated chair (gradually working on decreasing this); trunk flexibility in sitting    PT Home Exercise Plan Access Code [email or web address]    Consulted and Agree with Plan of Care Patient           Patient will benefit from skilled therapeutic intervention in order to improve the following deficits and impairments:  Decreased activity tolerance, Decreased range of motion, Decreased strength, Impaired flexibility, Obesity, Pain, Difficulty walking, Decreased mobility, Decreased balance, Decreased endurance  Visit Diagnosis: Right low back pain, unspecified chronicity, unspecified whether sciatica present  Muscle weakness (generalized)  Other abnormalities of gait and mobility     Problem List Patient Active Problem List   Diagnosis Date Noted   History of total knee replacement, 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   Lichen sclerosus et atrophicus 01/04/2015   LVH (left ventricular hypertrophy)    PAF (paroxysmal atrial fibrillation) (HCC) 07/06/2014   Thrombocytopenia (HCC) 07/06/2014   Hyperglycemia 07/06/2014   Morbid obesity (HCC) 01/25/2013   Hypothyroidism 01/15/2013   History of total knee replacement, left 08/09/2007    Solon Palm PT 01/03/2020, 11:19 AM   [facility] [address] [place], [place], [address] Phone: [phone]   Fax:  [phone]  Name: Sally Wise MRN: [id number] Date of Birth: Aug 12, 1945

## 2020-01-04 DIAGNOSIS — R29898 Other symptoms and signs involving the musculoskeletal system: Secondary | ICD-10-CM | POA: Diagnosis not present

## 2020-01-10 ENCOUNTER — Ambulatory Visit: Payer: Medicare Other | Admitting: Physical Therapy

## 2020-01-11 ENCOUNTER — Other Ambulatory Visit: Payer: Self-pay | Admitting: Neurosurgery

## 2020-01-11 DIAGNOSIS — R29898 Other symptoms and signs involving the musculoskeletal system: Secondary | ICD-10-CM

## 2020-01-13 DIAGNOSIS — Z96651 Presence of right artificial knee joint: Secondary | ICD-10-CM | POA: Diagnosis not present

## 2020-01-13 DIAGNOSIS — Z471 Aftercare following joint replacement surgery: Secondary | ICD-10-CM | POA: Diagnosis not present

## 2020-01-13 DIAGNOSIS — Z96653 Presence of artificial knee joint, bilateral: Secondary | ICD-10-CM | POA: Diagnosis not present

## 2020-01-13 DIAGNOSIS — Z96652 Presence of left artificial knee joint: Secondary | ICD-10-CM | POA: Diagnosis not present

## 2020-01-18 ENCOUNTER — Telehealth: Payer: Self-pay | Admitting: Obstetrics and Gynecology

## 2020-01-18 NOTE — Telephone Encounter (Signed)
Patient cancelled 6 month recheck. She is having other health issues and will call back to reschedule in the fall.

## 2020-01-24 ENCOUNTER — Ambulatory Visit: Payer: Medicare Other | Attending: Physical Medicine and Rehabilitation | Admitting: Physical Therapy

## 2020-01-24 ENCOUNTER — Other Ambulatory Visit: Payer: Self-pay

## 2020-01-24 ENCOUNTER — Encounter: Payer: Self-pay | Admitting: Physical Therapy

## 2020-01-24 DIAGNOSIS — M6281 Muscle weakness (generalized): Secondary | ICD-10-CM | POA: Insufficient documentation

## 2020-01-24 DIAGNOSIS — R2689 Other abnormalities of gait and mobility: Secondary | ICD-10-CM | POA: Diagnosis not present

## 2020-01-24 DIAGNOSIS — M545 Low back pain, unspecified: Secondary | ICD-10-CM

## 2020-01-24 NOTE — Therapy (Signed)
[facility] [address], [place], [place], [address] Phone: [phone]   Fax:  [phone]  Physical Therapy Treatment  Patient Details  Name: Sally Wise MRN: [id number] Date of Birth: 12-25-1945 Referring Provider (PT): Suella Broad, MD   Encounter Date: 01/24/2020   PT End of Session - 01/24/20 1231    Visit Number 3    Date for PT Re-Evaluation 02/28/20    Authorization Type BCBS    Authorization Time Period 12/29/19 to 03/30/20    PT Start Time 1230    PT Stop Time 1315    PT Time Calculation (min) 45 min    Activity Tolerance Patient tolerated treatment well    Behavior During Therapy [facility] for tasks assessed/performed;Anxious           Past Medical History:  Diagnosis Date  . Hematuria    evaluation with Dr. Joelyn Oms  . Hypothyroidism 06/2010  . Kidney stone 04/2004  . Lichen sclerosus et atrophicus 2/13   biopsy proven  . LVH (left ventricular hypertrophy)   . Morbid obesity ([place])   . PAF (paroxysmal atrial fibrillation) ([place])    a. Remote hx, re-established care 01/2013 with Dr. Johnsie Cancel for recurrence. 2D echo 01/2013: mod LVH, EF 55-65%, no RWMA, grade 1 d/d. b. 06/2014: started on Xarelto.   . Thrombocytopenia ([place])   . Vitamin D deficiency 06/2010  . Wears glasses     Past Surgical History:  Procedure Laterality Date  . BREAST EXCISIONAL BIOPSY  2010   left--was a vascular lesion  . CARPAL TUNNEL RELEASE Left 08/09/2013   Procedure: LEFT CARPAL TUNNEL RELEASE;  Surgeon: Cammie Sickle., MD;  Location: [place];  Service: Orthopedics;  Laterality: Left;  . CATARACT EXTRACTION    . COLONOSCOPY  04/2003      . DILATION AND CURETTAGE OF UTERUS    . HYSTEROSCOPY WITH D & C  8/99  . 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 TONSILLECTOMY    . TRIGGER FINGER RELEASE Left 08/09/2013   Procedure: LEFT A1/A2 PULLEY RELEASE;  Surgeon: Cammie Sickle., MD;  Location:  [place];  Service: Orthopedics;  Laterality: Left;  . TUBAL LIGATION  5/77    There were no vitals filed for this visit.   Subjective Assessment - 01/24/20 1232    Subjective I'm discouraged. I don't use the walker at home but I have to when I go out. And I can't lift my leg.  I just feel fatigued.    Pertinent History B TKA, thromboctopenia, morbid obesity    Patient Stated Goals find comfort when sitting    Currently in Pain? No/denies                             [facility] Adult PT Treatment/Exercise - 01/24/20 0001      Lumbar Exercises: Aerobic   Nustep L1 x 3 min      Lumbar Exercises: Standing   Other Standing Lumbar Exercises 4 inch tap hip flex with knee bent 2x 10 BUE support   cues to avoid circumduction   Other Standing Lumbar Exercises Hip ADDuction on slider x 10 ea; Hip ADDuction with one UE support x 10 bil (difficult)      Lumbar Exercises: Seated   Sit to Stand 10 reps    Sit to Stand Limitations with left leg further back than right  Other Seated Lumbar Exercises adductor squeeze 10 sec hold x 5      Manual Therapy   Manual Therapy Soft tissue mobilization;Myofascial release    Manual therapy comments in left [facility]    Soft tissue mobilization to right gluteals and pirifomis and ITB    Myofascial Release TPR to right gluteals and ITB                  PT Education - 01/24/20 1318    Education Details HEP progressed; encouraged aquatic therapy    Person(s) Educated Patient    Methods Explanation;Demonstration;Handout    Comprehension Verbalized understanding;Returned demonstration            PT Short Term Goals - 01/24/20 1318      PT SHORT TERM GOAL #1   Title Pt will be independent with her initial HEP.    Baseline cannot do heel slide (too difficult)    Status Partially Met             PT Long Term Goals - 12/29/19 1743      PT LONG TERM GOAL #1   Title Pt will be able to complete sit to/from  supine transition with no more than CGA for safety.    Time 8    Period Weeks    Status New      PT LONG TERM GOAL #2   Title Pt will be able to complete sit to stand x5 reps from standard chair using arm rests.    Time 8    Period Weeks    Status New      PT LONG TERM GOAL #3   Title Pt will have Rt hip flexion strength of atleast 4/5 MMT.    Time 8    Period Weeks    Status New      PT LONG TERM GOAL #4   Title Pt will be able to step up onto 6" step x5 reps with no more than 1 handrail support.    Time 8    Period Weeks    Status New      PT LONG TERM GOAL #5   Title Pt will report atleast 20% improvement in her pain from the start of PT.    Time 8    Period Weeks                 Plan - 01/24/20 1320    Clinical Impression Statement Patient presents with reports of being discouraged due to needing to use walker in the community. Despite that she appears much more stable and agile today then her last visit three weeks ago. She tolerated more exercise today and does well with encouragement. She is lacks confidence in her strength and abilities. We modified sit to stand to stagger stance with left foot back and result was much improved and less effort. She still has tightness and trigger points in her gluteals and ITB and is agreeable to trying DN at next visit.    Personal Factors and Comorbidities Age;Comorbidity 1    Examination-Activity Limitations Transfers;Toileting;Squat;Sleep;Bed Mobility;Stairs    PT Frequency 2x / week    PT Duration 8 weeks    PT Treatment/Interventions ADLs/Self Care Home Management;Aquatic Therapy;Electrical Stimulation;Cryotherapy;Moist Heat;DME Instruction;Stair training;Functional mobility training;Therapeutic activities;Neuromuscular re-education;Therapeutic exercise;Patient/family education;Balance training;Manual techniques;Dry needling;Passive range of motion    PT Next Visit Plan continue to encourage aquatic program; STW/DN to right  gluteals/lumbar; Rt hip flexor strength progression; sit to stand with stagger  stance; trunk flexibility in sitting    PT Home Exercise Plan Access Code [email or web address]    Consulted and Agree with Plan of Care Patient           Patient will benefit from skilled therapeutic intervention in order to improve the following deficits and impairments:  Decreased activity tolerance, Decreased range of motion, Decreased strength, Impaired flexibility, Obesity, Pain, Difficulty walking, Decreased mobility, Decreased balance, Decreased endurance  Visit Diagnosis: Right low back pain, unspecified chronicity, unspecified whether sciatica present  Muscle weakness (generalized)  Other abnormalities of gait and mobility     Problem List Patient Active Problem List   Diagnosis Date Noted  . 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 Lichen sclerosus et atrophicus 01/04/2015  . LVH (left ventricular hypertrophy)   . PAF (paroxysmal atrial fibrillation) ([place]) 07/06/2014  . Thrombocytopenia ([place]) 07/06/2014  . Hyperglycemia 07/06/2014  . Morbid obesity ([place]) 01/25/2013  . Hypothyroidism 01/15/2013  . History of total knee replacement, left 08/09/2007    Madelyn Flavors PT 01/24/2020, 1:31 PM  [facility] [address] [place], [place], [address] Phone: [phone]   Fax:  [phone]  Name: Sally Wise MRN: [id number] Date of Birth: Aug 06, 1945

## 2020-01-24 NOTE — Patient Instructions (Signed)
Access Code: [email or web address] URL: [email or web address] Date: 01/24/2020 Prepared by: Raynelle Fanning  Exercises Seated Flexion Stretch with [employer] - 2 x daily - 7 x weekly - 2 reps - 5 seconds hold Seated Hip Adduction Isometrics with Ball - 2 x daily - 7 x weekly - 2 sets - 10 reps - seconds hold Supine Heel Slide - 2 x daily - 7 x weekly - 2 sets - 10 reps Supine Single Bent Knee Fallout - 2 x daily - 7 x weekly - 5 reps - 10 seconds hold Standing Hip Adduction - 1 x daily - 7 x weekly - 1-3 sets - 10 reps Standing Toe Taps - 1 x daily - 7 x weekly - 1-3 sets - 10 reps

## 2020-01-25 ENCOUNTER — Ambulatory Visit: Payer: Medicare Other | Admitting: Physical Therapy

## 2020-01-25 ENCOUNTER — Other Ambulatory Visit: Payer: Self-pay

## 2020-01-25 ENCOUNTER — Encounter: Payer: Self-pay | Admitting: Physical Therapy

## 2020-01-25 DIAGNOSIS — R2689 Other abnormalities of gait and mobility: Secondary | ICD-10-CM

## 2020-01-25 DIAGNOSIS — M545 Low back pain, unspecified: Secondary | ICD-10-CM

## 2020-01-25 DIAGNOSIS — M6281 Muscle weakness (generalized): Secondary | ICD-10-CM | POA: Diagnosis not present

## 2020-01-25 NOTE — Therapy (Signed)
[facility] [address] [place], [place], [address] Phone: [phone]   Fax:  [phone]  Physical Therapy Treatment  Patient Details  Name: Sally Wise MRN: [id number] Date of Birth: [birth year] Referring Provider (PT): Suella Broad, MD   Encounter Date: 01/25/2020   PT End of Session - 01/25/20 1317    Visit Number 4    Date for PT Re-Evaluation 02/28/20    Authorization Type BCBS    Authorization Time Period 12/29/19 to 03/30/20    PT Start Time 1230    PT Stop Time 1315    PT Time Calculation (min) 45 min    Activity Tolerance Patient tolerated treatment well;No increased pain    Behavior During Therapy [facility] for tasks assessed/performed           Past Medical History:  Diagnosis Date  . Hematuria    evaluation with Dr. Joelyn Oms  . Hypothyroidism 06/2010  . Kidney stone 04/2004  . Lichen sclerosus et atrophicus 2/13   biopsy proven  . LVH (left ventricular hypertrophy)   . Morbid obesity ([place])   . PAF (paroxysmal atrial fibrillation) ([place])    a. Remote hx, re-established care 01/2013 with Dr. Johnsie Cancel for recurrence. 2D echo 01/2013: mod LVH, EF 55-65%, no RWMA, grade 1 d/d. b. 06/2014: started on Xarelto.   . Thrombocytopenia ([place])   . Vitamin D deficiency 06/2010  . Wears glasses     Past Surgical History:  Procedure Laterality Date  . BREAST EXCISIONAL BIOPSY  2010   left--was a vascular lesion  . CARPAL TUNNEL RELEASE Left 08/09/2013   Procedure: LEFT CARPAL TUNNEL RELEASE;  Surgeon: Cammie Sickle., MD;  Location: [place];  Service: Orthopedics;  Laterality: Left;  . CATARACT EXTRACTION    . COLONOSCOPY  04/2003      . DILATION AND CURETTAGE OF UTERUS    . HYSTEROSCOPY WITH D & C  8/99  . 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 TONSILLECTOMY    . TRIGGER FINGER RELEASE Left 08/09/2013   Procedure: LEFT A1/A2 PULLEY RELEASE;  Surgeon: Cammie Sickle., MD;   Location: [place];  Service: Orthopedics;  Laterality: Left;  . TUBAL LIGATION  5/77    There were no vitals filed for this visit.   Subjective Assessment - 01/25/20 1237    Subjective Pt states that things are going well. She is not interested in aquatic PT right now.    Pertinent History B TKA, thromboctopenia, morbid obesity    Patient Stated Goals find comfort when sitting    Currently in Pain? No/denies                             [facility] Adult PT Treatment/Exercise - 01/25/20 0001      Exercises   Exercises Knee/Hip      Knee/Hip Exercises: Standing   Hip Flexion Right;1 set;10 reps    Hip Abduction Stengthening;Left;Right;2 sets;10 reps    Abduction Limitations red TB around ankles     Other Standing Knee Exercises Rt standing with Lt on step 8x5 sec hold 1 UE support       Knee/Hip Exercises: Seated   Other Seated Knee/Hip Exercises Rt hip flexion with strap assistance x10 reps  Clamshell green TB x10 reps      Knee/Hip Exercises: Supine   Other Supine Knee/Hip Exercises PT resisted Rt hip  flexion x10 reps           manual treatment: STM with addaday tool along Rt gluteals, pt in Lt sidelying        PT Education - 01/25/20 1317    Education Details updated HEP    Person(s) Educated Patient    Methods Explanation;Handout    Comprehension Verbalized understanding            PT Short Term Goals - 01/24/20 1318      PT SHORT TERM GOAL #1   Title Pt will be independent with her initial HEP.    Baseline cannot do heel slide (too difficult)    Status Partially Met             PT Long Term Goals - 12/29/19 1743      PT LONG TERM GOAL #1   Title Pt will be able to complete sit to/from supine transition with no more than CGA for safety.    Time 8    Period Weeks    Status New      PT LONG TERM GOAL #2   Title Pt will be able to complete sit to stand x5 reps from standard chair using arm rests.    Time 8     Period Weeks    Status New      PT LONG TERM GOAL #3   Title Pt will have Rt hip flexion strength of atleast 4/5 MMT.    Time 8    Period Weeks    Status New      PT LONG TERM GOAL #4   Title Pt will be able to step up onto 6" step x5 reps with no more than 1 handrail support.    Time 8    Period Weeks    Status New      PT LONG TERM GOAL #5   Title Pt will report atleast 20% improvement in her pain from the start of PT.    Time 8    Period Weeks                 Plan - 01/25/20 1317    Clinical Impression Statement Pt is making steady progress towards her goals, meeting all short-term goals currently. She has no pain with sitting. She was able to complete car transfer today with less UE assistance. Pt made some adjustments to her current HEP to increase focus on hip flexor and adductor weakness. Pt was able to complete supine resistance hip flexion with LE fatigue noted after 10 reps. Ended with soft tissue mobilization to the Rt gluteals. Will continue with current POC.    Personal Factors and Comorbidities Age;Comorbidity 1    Examination-Activity Limitations Transfers;Toileting;Squat;Sleep;Bed Mobility;Stairs    PT Frequency 2x / week    PT Duration 8 weeks    PT Treatment/Interventions ADLs/Self Care Home Management;Aquatic Therapy;Electrical Stimulation;Cryotherapy;Moist Heat;DME Instruction;Stair training;Functional mobility training;Therapeutic activities;Neuromuscular re-education;Therapeutic exercise;Patient/family education;Balance training;Manual techniques;Dry needling;Passive range of motion    PT Next Visit Plan STW/DN to right gluteals/lumbar as needed; Rt hip flexor strength progression supine; sit to stand with stagger stance; trial leg press; trunk flexibility in sitting    PT Home Exercise Plan Access Code [email or web address]    Consulted and Agree with Plan of Care Patient           Patient will benefit from skilled therapeutic intervention in order to improve the  following deficits and impairments:  Decreased activity tolerance, Decreased range of motion, Decreased  strength, Impaired flexibility, Obesity, Pain, Difficulty walking, Decreased mobility, Decreased balance, Decreased endurance  Visit Diagnosis: Right low back pain, unspecified chronicity, unspecified whether sciatica present  Muscle weakness (generalized)  Other abnormalities of gait and mobility     Problem List Patient Active Problem List   Diagnosis Date Noted  . 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 Lichen sclerosus et atrophicus 01/04/2015  . LVH (left ventricular hypertrophy)   . PAF (paroxysmal atrial fibrillation) ([place]) 07/06/2014  . Thrombocytopenia ([place]) 07/06/2014  . Hyperglycemia 07/06/2014  . Morbid obesity ([place]) 01/25/2013  . Hypothyroidism 01/15/2013  . History of total knee replacement, left 08/09/2007    1:22 PM,01/25/20 Sherol Dade PT, DPT [facility] [address] [place], [place], [address] Phone: [phone]   Fax:  [phone]  Name: CORRINE TILLIS MRN: [id number] Date of Birth: [birth year]

## 2020-01-25 NOTE — Patient Instructions (Signed)
Access Code: [email or web address]: [email or web address]: 07/14/2021Prepared by: [facility]  Seated Hip Adduction Isometrics with Ball - 2 x daily - 7 x weekly - 2 sets - 10 reps - seconds hold  Standing Toe Taps - 1 x daily - 7 x weekly - 1-3 sets - 10 reps  Seated Hip Abduction - 2 x daily - 7 x weekly - 2 sets - 10 reps  Supine Heel Slide with Strap - 2 x daily - 7 x weekly - 2 sets - 10 reps  [facility] [address] [place], [place] [address] Phone # [phone] Fax [phone]

## 2020-01-26 ENCOUNTER — Ambulatory Visit: Payer: Medicare Other | Admitting: Obstetrics and Gynecology

## 2020-01-31 ENCOUNTER — Ambulatory Visit: Payer: Medicare Other | Admitting: Physical Therapy

## 2020-02-02 ENCOUNTER — Ambulatory Visit: Payer: Medicare Other | Admitting: Physical Therapy

## 2020-02-05 ENCOUNTER — Other Ambulatory Visit: Payer: Self-pay

## 2020-02-05 ENCOUNTER — Ambulatory Visit
Admission: RE | Admit: 2020-02-05 | Discharge: 2020-02-05 | Disposition: A | Discharge status: Home / Self Care | Payer: BC Managed Care – PPO | Source: Ambulatory Visit | Attending: Neurosurgery | Admitting: Neurosurgery

## 2020-02-05 DIAGNOSIS — M5124 Other intervertebral disc displacement, thoracic region: Secondary | ICD-10-CM | POA: Diagnosis not present

## 2020-02-05 DIAGNOSIS — R29898 Other symptoms and signs involving the musculoskeletal system: Secondary | ICD-10-CM

## 2020-02-05 DIAGNOSIS — M40204 Unspecified kyphosis, thoracic region: Secondary | ICD-10-CM | POA: Diagnosis not present

## 2020-02-05 DIAGNOSIS — M47814 Spondylosis without myelopathy or radiculopathy, thoracic region: Secondary | ICD-10-CM | POA: Diagnosis not present

## 2020-02-05 DIAGNOSIS — R600 Localized edema: Secondary | ICD-10-CM | POA: Diagnosis not present

## 2020-02-07 ENCOUNTER — Ambulatory Visit: Payer: Medicare Other | Admitting: Physical Therapy

## 2020-02-07 ENCOUNTER — Encounter: Payer: Self-pay | Admitting: Physical Therapy

## 2020-02-07 ENCOUNTER — Other Ambulatory Visit: Payer: Self-pay

## 2020-02-07 DIAGNOSIS — M545 Low back pain, unspecified: Secondary | ICD-10-CM

## 2020-02-07 DIAGNOSIS — M6281 Muscle weakness (generalized): Secondary | ICD-10-CM | POA: Diagnosis not present

## 2020-02-07 DIAGNOSIS — R2689 Other abnormalities of gait and mobility: Secondary | ICD-10-CM

## 2020-02-07 NOTE — Therapy (Signed)
[facility] [address] [place], [place], [address] Phone: [phone]   Fax:  [phone]  Physical Therapy Treatment  Patient Details  Name: Sally Wise MRN: [id number] Date of Birth: 09-19-1945 Referring Provider (PT): Suella Broad, MD   Encounter Date: 02/07/2020   PT End of Session - 02/07/20 1602    Visit Number 5    Date for PT Re-Evaluation 02/28/20    Authorization Type BCBS    Authorization Time Period 12/29/19 to 03/30/20    PT Start Time 1232    PT Stop Time 1316    PT Time Calculation (min) 44 min    Activity Tolerance Patient tolerated treatment well;No increased pain    Behavior During Therapy WFL for tasks assessed/performed           Past Medical History:  Diagnosis Date  . Hematuria    evaluation with Dr. Joelyn Oms  . Hypothyroidism 06/2010  . Kidney stone 04/2004  . Lichen sclerosus et atrophicus 2/13   biopsy proven  . LVH (left ventricular hypertrophy)   . Morbid obesity ([place])   . PAF (paroxysmal atrial fibrillation) ([place])    a. Remote hx, re-established care 01/2013 with Dr. Johnsie Cancel for recurrence. 2D echo 01/2013: mod LVH, EF 55-65%, no RWMA, grade 1 d/d. b. 06/2014: started on Xarelto.   . Thrombocytopenia ([place])   . Vitamin D deficiency 06/2010  . Wears glasses     Past Surgical History:  Procedure Laterality Date  . BREAST EXCISIONAL BIOPSY  2010   left--was a vascular lesion  . CARPAL TUNNEL RELEASE Left 08/09/2013   Procedure: LEFT CARPAL TUNNEL RELEASE;  Surgeon: Cammie Sickle., MD;  Location: [place];  Service: Orthopedics;  Laterality: Left;  . CATARACT EXTRACTION    . COLONOSCOPY  04/2003      . DILATION AND CURETTAGE OF UTERUS    . HYSTEROSCOPY WITH D & C  8/99  . 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 TONSILLECTOMY    . TRIGGER FINGER RELEASE Left 08/09/2013   Procedure: LEFT A1/A2 PULLEY RELEASE;  Surgeon: Cammie Sickle., MD;   Location: [place];  Service: Orthopedics;  Laterality: Left;  . TUBAL LIGATION  5/77    There were no vitals filed for this visit.   Subjective Assessment - 02/07/20 1236    Subjective Pt states that things are not good. Her Rt leg continues to have issues with getting on and off the toilet/out of chairs. She is not doing her HEP consistently.    Pertinent History B TKA, thromboctopenia, morbid obesity    Patient Stated Goals find comfort when sitting    Currently in Pain? No/denies                             [facility] Adult PT Treatment/Exercise - 02/07/20 0001      Knee/Hip Exercises: Standing   Other Standing Knee Exercises Lt hip slider 3x5 reps out to the side, encouraged Rt knee partial bend     Other Standing Knee Exercises standing at wall: ipsilateral LE/UE flexion with support at wall x10 reps       Knee/Hip Exercises: Seated   Other Seated Knee/Hip Exercises Rt quad isometric into bosu 2x10 reps, 5 sec hold     Sit to Sand 1 set;5 reps   at lowest table setting pt could tolerate  Knee/Hip Exercises: Supine   Other Supine Knee/Hip Exercises Rt long axis hip ER/IR x15 reps      Other Supine Knee/Hip Exercises Rt hip flexion AAROM with PT assist x15 reps, Rt adduction in hooklying 10x3 sec hold       Manual Therapy   Soft tissue mobilization Addaday Rt gluteals in Lt sidelying                   PT Education - 02/07/20 1600    Education Details technique with therex    Person(s) Educated Patient    Methods Explanation    Comprehension Verbalized understanding            PT Short Term Goals - 01/24/20 1318      PT SHORT TERM GOAL #1   Title Pt will be independent with her initial HEP.    Baseline cannot do heel slide (too difficult)    Status Partially Met             PT Long Term Goals - 12/29/19 1743      PT LONG TERM GOAL #1   Title Pt will be able to complete sit to/from supine transition with no more  than CGA for safety.    Time 8    Period Weeks    Status New      PT LONG TERM GOAL #2   Title Pt will be able to complete sit to stand x5 reps from standard chair using arm rests.    Time 8    Period Weeks    Status New      PT LONG TERM GOAL #3   Title Pt will have Rt hip flexion strength of atleast 4/5 MMT.    Time 8    Period Weeks    Status New      PT LONG TERM GOAL #4   Title Pt will be able to step up onto 6" step x5 reps with no more than 1 handrail support.    Time 8    Period Weeks    Status New      PT LONG TERM GOAL #5   Title Pt will report atleast 20% improvement in her pain from the start of PT.    Time 8    Period Weeks                 Plan - 02/07/20 1604    Clinical Impression Statement Pt remains discouraged about her lack of mobility. PT reassured the pt that this is a gradual process. Pt demonstrates lack of hamstring and quadriceps stability in standing, with knee hyperextension on the Rt. Pt was able to complete sit to stand from decreased chair height and was encouraged to challenge herself with this at home. Will continue with current POC.    Personal Factors and Comorbidities Age;Comorbidity 1    Examination-Activity Limitations Transfers;Toileting;Squat;Sleep;Bed Mobility;Stairs    PT Frequency 2x / week    PT Duration 8 weeks    PT Treatment/Interventions ADLs/Self Care Home Management;Aquatic Therapy;Electrical Stimulation;Cryotherapy;Moist Heat;DME Instruction;Stair training;Functional mobility training;Therapeutic activities;Neuromuscular re-education;Therapeutic exercise;Patient/family education;Balance training;Manual techniques;Dry needling;Passive range of motion    PT Next Visit Plan STW/DN to right gluteals/lumbar as needed; Rt hip flexor strength progression supine; sit to stand with stagger stance; trial leg press; trunk flexibility in sitting    PT Home Exercise Plan Access Code [email or web address]    Consulted and Agree with Plan of Care  Patient  Patient will benefit from skilled therapeutic intervention in order to improve the following deficits and impairments:  Decreased activity tolerance, Decreased range of motion, Decreased strength, Impaired flexibility, Obesity, Pain, Difficulty walking, Decreased mobility, Decreased balance, Decreased endurance  Visit Diagnosis: Right low back pain, unspecified chronicity, unspecified whether sciatica present  Muscle weakness (generalized)  Other abnormalities of gait and mobility     Problem List Patient Active Problem List   Diagnosis Date Noted  . 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 Lichen sclerosus et atrophicus 01/04/2015  . LVH (left ventricular hypertrophy)   . PAF (paroxysmal atrial fibrillation) ([place]) 07/06/2014  . Thrombocytopenia ([place]) 07/06/2014  . Hyperglycemia 07/06/2014  . Morbid obesity ([place]) 01/25/2013  . Hypothyroidism 01/15/2013  . History of total knee replacement, left 08/09/2007    4:59 PM,02/07/20 Sherol Dade PT, DPT [place] [facility] [place]  [facility] [address] [place], [place], [address] Phone: [phone]   Fax:  [phone]  Name: Sally Wise MRN: [id number] Date of Birth: 1946/06/06

## 2020-02-08 DIAGNOSIS — R29898 Other symptoms and signs involving the musculoskeletal system: Secondary | ICD-10-CM | POA: Diagnosis not present

## 2020-02-14 ENCOUNTER — Other Ambulatory Visit: Payer: Self-pay

## 2020-02-14 ENCOUNTER — Encounter: Payer: Self-pay | Admitting: Physical Therapy

## 2020-02-14 ENCOUNTER — Ambulatory Visit: Payer: Medicare Other | Attending: Physical Medicine and Rehabilitation | Admitting: Physical Therapy

## 2020-02-14 DIAGNOSIS — R2689 Other abnormalities of gait and mobility: Secondary | ICD-10-CM | POA: Insufficient documentation

## 2020-02-14 DIAGNOSIS — M545 Low back pain, unspecified: Secondary | ICD-10-CM

## 2020-02-14 DIAGNOSIS — M6281 Muscle weakness (generalized): Secondary | ICD-10-CM | POA: Insufficient documentation

## 2020-02-14 NOTE — Therapy (Signed)
[facility] [address], [place], [place], [address] Phone: [phone]   Fax:  [phone]  Physical Therapy Treatment  Patient Details  Name: Sally Wise MRN: [id number] Date of Birth: 1945-10-21 Referring Provider (PT): Suella Broad, MD   Encounter Date: 02/14/2020   PT End of Session - 02/14/20 1300    Visit Number 6    Date for PT Re-Evaluation 02/28/20    Authorization Type BCBS    Authorization Time Period 12/29/19 to 03/30/20    PT Start Time 1017    PT Stop Time 1059    PT Time Calculation (min) 42 min    Activity Tolerance Patient tolerated treatment well;No increased pain    Behavior During Therapy [facility] for tasks assessed/performed           Past Medical History:  Diagnosis Date   Hematuria    evaluation with Dr. Joelyn Oms   Hypothyroidism 06/2010   Kidney stone 41/7408   Lichen sclerosus et atrophicus 2/13   biopsy proven   LVH (left ventricular hypertrophy)    Morbid obesity ([facility])    PAF (paroxysmal atrial fibrillation) ([place])    a. Remote hx, re-established care 01/2013 with Dr. Johnsie Cancel for recurrence. 2D echo 01/2013: mod LVH, EF 55-65%, no RWMA, grade 1 d/d. b. 06/2014: started on Xarelto.    Thrombocytopenia ([place])    Vitamin D deficiency 06/2010   Wears glasses     Past Surgical History:  Procedure Laterality Date   BREAST EXCISIONAL BIOPSY  2010   left--was a vascular lesion   CARPAL TUNNEL RELEASE Left 08/09/2013   Procedure: LEFT CARPAL TUNNEL RELEASE;  Surgeon: Cammie Sickle., MD;  Location: [facility];  Service: Orthopedics;  Laterality: Left;   CATARACT EXTRACTION     COLONOSCOPY  04/2003       DILATION AND CURETTAGE OF UTERUS     HYSTEROSCOPY WITH D & C  8/99   REPLACEMENT TOTAL KNEE Bilateral 12/2006  07/2007    TONSILLECTOMY     TRIGGER FINGER RELEASE Left 08/09/2013   Procedure: LEFT A1/A2 PULLEY RELEASE;  Surgeon: Cammie Sickle., MD;   Location: [facility];  Service: Orthopedics;  Laterality: Left;   TUBAL LIGATION  5/77    There were no vitals filed for this visit.   Subjective Assessment - 02/14/20 1113    Subjective Pt has no pain currently. She is working on her band exercise.    Pertinent History B TKA, thromboctopenia, morbid obesity    Patient Stated Goals find comfort when sitting    Currently in Pain? No/denies                             [facility] Adult PT Treatment/Exercise - 02/14/20 0001      Knee/Hip Exercises: Seated   Long Arc Quad Strengthening;Right;2 sets;10 reps    Long Arc Quad Weight 5 lbs.    Long [employer] Limitations 1 set 3 sec eccentric lower with #3 ankle weight Rt     Other Seated Knee/Hip Exercises hamstring curl Rt green x10, 2x10 black    Sit to Sand 2 sets;5 reps   chair height 20in, UE support and cuing to bring Rt LE under     Knee/Hip Exercises: Supine   Straight Leg Raises Right;AAROM;1 set;10 reps    Straight Leg Raises Limitations pt encourage to help control eccentric portion PT assist  Manual Therapy   Soft tissue mobilization Addaday Rt quadriceps                   PT Education - 02/14/20 1258    Education Details importance of maintaining focused on small goals and improvements to avoid feeling overwhelmed by her lack of mobility; technique with sit to stand    Person(s) Educated Patient    Methods Explanation;Verbal cues    Comprehension Verbalized understanding;Returned demonstration            PT Short Term Goals - 01/24/20 1318      PT SHORT TERM GOAL #1   Title Pt will be independent with her initial HEP.    Baseline cannot do heel slide (too difficult)    Status Partially Met             PT Long Term Goals - 12/29/19 1743      PT LONG TERM GOAL #1   Title Pt will be able to complete sit to/from supine transition with no more than CGA for safety.    Time 8    Period Weeks    Status New      PT LONG  TERM GOAL #2   Title Pt will be able to complete sit to stand x5 reps from standard chair using arm rests.    Time 8    Period Weeks    Status New      PT LONG TERM GOAL #3   Title Pt will have Rt hip flexion strength of atleast 4/5 MMT.    Time 8    Period Weeks    Status New      PT LONG TERM GOAL #4   Title Pt will be able to step up onto 6" step x5 reps with no more than 1 handrail support.    Time 8    Period Weeks    Status New      PT LONG TERM GOAL #5   Title Pt will report atleast 20% improvement in her pain from the start of PT.    Time 8    Period Weeks                 Plan - 02/14/20 1303    Clinical Impression Statement Despite pts continued frustration with her limited mobility, she is making progress towards her PT goals. She can complete sit to stand from a lower surface, down to 20 inches today with UE support. Pt demonstrates compensations with weight shift to the Lt and placing her Rt LE in an abducted position. This improved some with PT cuing, but it made the transition to standing more difficult. Pt was encouraged to focus on sit to stand from lower surfaces with repetition at home. She verbalized agreement with this.    Personal Factors and Comorbidities Age;Comorbidity 1    Examination-Activity Limitations Transfers;Toileting;Squat;Sleep;Bed Mobility;Stairs    PT Frequency 2x / week    PT Duration 8 weeks    PT Treatment/Interventions ADLs/Self Care Home Management;Aquatic Therapy;Electrical Stimulation;Cryotherapy;Moist Heat;DME Instruction;Stair training;Functional mobility training;Therapeutic activities;Neuromuscular re-education;Therapeutic exercise;Patient/family education;Balance training;Manual techniques;Dry needling;Passive range of motion    PT Next Visit Plan STW/DN to right gluteals/lumbar as needed; Rt hip flexor strength progression supine; sit to stand with stagger stance; trial leg press; trunk flexibility in sitting    PT Home  Exercise Plan Access Code [email or web address]    Consulted and Agree with Plan of Care Patient  Patient will benefit from skilled therapeutic intervention in order to improve the following deficits and impairments:  Decreased activity tolerance, Decreased range of motion, Decreased strength, Impaired flexibility, Obesity, Pain, Difficulty walking, Decreased mobility, Decreased balance, Decreased endurance  Visit Diagnosis: Right low back pain, unspecified chronicity, unspecified whether sciatica present  Muscle weakness (generalized)  Other abnormalities of gait and mobility     Problem List Patient Active Problem List   Diagnosis Date Noted   History of total knee replacement, right 08/24/1550   Lichen sclerosus et atrophicus 01/04/2015   LVH (left ventricular hypertrophy)    PAF (paroxysmal atrial fibrillation) ([place]) 07/06/2014   Thrombocytopenia ([place]) 07/06/2014   Hyperglycemia 07/06/2014   Morbid obesity ([place]) 01/25/2013   Hypothyroidism 01/15/2013   History of total knee replacement, left 08/09/2007    2:59 PM,02/14/20 Sherol Dade PT, DPT [place] [facility] [place] [address], [place], [place], [address] Phone: [phone]   Fax:  [phone]  Name: Sally Wise MRN: [id number] Date of Birth: 1946/03/03

## 2020-02-16 ENCOUNTER — Ambulatory Visit: Payer: Medicare Other | Admitting: Physical Therapy

## 2020-02-16 ENCOUNTER — Other Ambulatory Visit: Payer: Self-pay

## 2020-02-16 ENCOUNTER — Encounter: Payer: Self-pay | Admitting: Physical Therapy

## 2020-02-16 DIAGNOSIS — M545 Low back pain, unspecified: Secondary | ICD-10-CM

## 2020-02-16 DIAGNOSIS — R2689 Other abnormalities of gait and mobility: Secondary | ICD-10-CM

## 2020-02-16 DIAGNOSIS — M6281 Muscle weakness (generalized): Secondary | ICD-10-CM | POA: Diagnosis not present

## 2020-02-16 NOTE — Therapy (Signed)
[facility] [address], [place], [place], [address] Phone: [phone]   Fax:  [phone]  Physical Therapy Treatment  Patient Details  Name: Sally Wise MRN: [id number] Date of Birth: [birth year] Referring Provider (PT): Suella Broad, MD   Encounter Date: 02/16/2020   PT End of Session - 02/16/20 1032    Visit Number 7    Date for PT Re-Evaluation 02/28/20    Authorization Type BCBS    Authorization Time Period 12/29/19 to 03/30/20    PT Start Time 1017    PT Stop Time 1100    PT Time Calculation (min) 43 min    Activity Tolerance Patient tolerated treatment well;No increased pain    Behavior During Therapy WFL for tasks assessed/performed           Past Medical History:  Diagnosis Date  . Hematuria    evaluation with Dr. Joelyn Oms  . Hypothyroidism 06/2010  . Kidney stone 04/2004  . Lichen sclerosus et atrophicus 2/13   biopsy proven  . LVH (left ventricular hypertrophy)   . Morbid obesity ([place])   . PAF (paroxysmal atrial fibrillation) ([place])    a. Remote hx, re-established care 01/2013 with Dr. Johnsie Cancel for recurrence. 2D echo 01/2013: mod LVH, EF 55-65%, no RWMA, grade 1 d/d. b. 06/2014: started on Xarelto.   . Thrombocytopenia ([place])   . Vitamin D deficiency 06/2010  . Wears glasses     Past Surgical History:  Procedure Laterality Date  . BREAST EXCISIONAL BIOPSY  2010   left--was a vascular lesion  . CARPAL TUNNEL RELEASE Left 08/09/2013   Procedure: LEFT CARPAL TUNNEL RELEASE;  Surgeon: Cammie Sickle., MD;  Location: [facility];  Service: Orthopedics;  Laterality: Left;  . CATARACT EXTRACTION    . COLONOSCOPY  04/2003      . DILATION AND CURETTAGE OF UTERUS    . HYSTEROSCOPY WITH D & C  8/99  . 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 TONSILLECTOMY    . TRIGGER FINGER RELEASE Left 08/09/2013   Procedure: LEFT A1/A2 PULLEY RELEASE;  Surgeon: Cammie Sickle., MD;   Location: [facility];  Service: Orthopedics;  Laterality: Left;  . TUBAL LIGATION  5/77    There were no vitals filed for this visit.   Subjective Assessment - 02/16/20 1023    Subjective Pt is feeling ok this morning. She is still concerns about the weakness in her Rt leg.    Pertinent History B TKA, thromboctopenia, morbid obesity    Patient Stated Goals find comfort when sitting    Currently in Pain? No/denies                             [facility] Adult PT Treatment/Exercise - 02/16/20 0001      Knee/Hip Exercises: Standing   Other Standing Knee Exercises mini squat 2x10 reps       Knee/Hip Exercises: Seated   Long Arc Quad Right;2 sets;10 reps    Long Arc Quad Weight 5 lbs.    Knee/Hip Flexion Rt hip flexion with black TB assist 2x10 reps     Other Seated Knee/Hip Exercises PT discussing pt set up with UE support similar to at home in the bathroom. Pt provided education on UE/LE placement for improved efficiency                  PT  Education - 02/16/20 1120    Education Details technique with therex    Person(s) Educated Patient    Methods Explanation    Comprehension Verbalized understanding            PT Short Term Goals - 01/24/20 1318      PT SHORT TERM GOAL #1   Title Pt will be independent with her initial HEP.    Baseline cannot do heel slide (too difficult)    Status Partially Met             PT Long Term Goals - 02/16/20 1025      PT LONG TERM GOAL #1   Title Pt will be able to complete sit to/from supine transition with no more than CGA for safety.    Time 8    Period Weeks    Status New      PT LONG TERM GOAL #2   Title Pt will be able to complete sit to stand x5 reps from standard chair using arm rests.    Time 8    Period Weeks    Status New      PT LONG TERM GOAL #3   Title Pt will have Rt hip flexion strength of atleast 4/5 MMT.    Time 8    Period Weeks    Status New      PT LONG TERM GOAL #4    Title Pt will be able to step up onto 6" step x5 reps with no more than 1 handrail support.    Time 8    Period Weeks    Status New      PT LONG TERM GOAL #5   Title Pt will report atleast 20% improvement in her pain from the start of PT.    Time 8    Period Weeks                 Plan - 02/16/20 1121    Clinical Impression Statement Today's session continued with focus on increasing LE strength and mobility. She required PT assistance with Nustep to maintain her knee in alignment with her body. PT attempted to review positioning and sit to stand mimicking her situation at home in the bathroom. Pt was unable to complete stand with 1 UE support on the armrest despite PT efforts to adjust her position and LE placement. Pt was encouraged to continue with current HEP and focus on this to see more progress towards her goals.    Personal Factors and Comorbidities Age;Comorbidity 1    Examination-Activity Limitations Transfers;Toileting;Squat;Sleep;Bed Mobility;Stairs    PT Frequency 2x / week    PT Duration 8 weeks    PT Treatment/Interventions ADLs/Self Care Home Management;Aquatic Therapy;Electrical Stimulation;Cryotherapy;Moist Heat;DME Instruction;Stair training;Functional mobility training;Therapeutic activities;Neuromuscular re-education;Therapeutic exercise;Patient/family education;Balance training;Manual techniques;Dry needling;Passive range of motion    PT Next Visit Plan STW/DN to right gluteals/lumbar as needed; Rt hip flexor strength progression supine; sit to stand with stagger stance; trial leg press; trunk flexibility in sitting    PT Home Exercise Plan Access Code [email or web address]    Consulted and Agree with Plan of Care Patient           Patient will benefit from skilled therapeutic intervention in order to improve the following deficits and impairments:  Decreased activity tolerance, Decreased range of motion, Decreased strength, Impaired flexibility, Obesity, Pain, Difficulty  walking, Decreased mobility, Decreased balance, Decreased endurance  Visit Diagnosis: Right low back pain, unspecified chronicity, unspecified whether sciatica present  Muscle weakness (generalized)  Other abnormalities of gait and mobility     Problem List Patient Active Problem List   Diagnosis Date Noted  . 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 Lichen sclerosus et atrophicus 01/04/2015  . LVH (left ventricular hypertrophy)   . PAF (paroxysmal atrial fibrillation) ([place]) 07/06/2014  . Thrombocytopenia ([place]) 07/06/2014  . Hyperglycemia 07/06/2014  . Morbid obesity ([place]) 01/25/2013  . Hypothyroidism 01/15/2013  . History of total knee replacement, left 08/09/2007    2:21 PM,02/16/20 Sherol Dade PT, DPT [place] [facility] [address], [place], [place], [address] Phone: [phone]   Fax:  [phone]  Name: Sally Wise MRN: [id number] Date of Birth: October 27, 1945

## 2020-02-20 ENCOUNTER — Encounter: Payer: Self-pay | Admitting: Physical Therapy

## 2020-02-20 ENCOUNTER — Ambulatory Visit: Payer: Medicare Other | Admitting: Physical Therapy

## 2020-02-20 DIAGNOSIS — M6281 Muscle weakness (generalized): Secondary | ICD-10-CM | POA: Diagnosis not present

## 2020-02-20 DIAGNOSIS — R2689 Other abnormalities of gait and mobility: Secondary | ICD-10-CM

## 2020-02-20 DIAGNOSIS — M545 Low back pain, unspecified: Secondary | ICD-10-CM

## 2020-02-20 NOTE — Patient Instructions (Signed)
Access Code: [email or web address] URL: [email or web address]: 08/09/2021Prepared by: Loistine Simas BeuhringExercises  Seated Hip Adduction Isometrics with Ball - 2 x daily - 7 x weekly - 2 sets - 10 reps - seconds hold  Standing Toe Taps - 1 x daily - 7 x weekly - 1-3 sets - 10 reps  Seated Hip Abduction - 2 x daily - 7 x weekly - 2 sets - 10 reps  Supine Heel Slide with Strap - 2 x daily - 7 x weekly - 2 sets - 10 reps  Seated Heel Toe Raises - 1 x daily - 7 x weekly - 1 sets - 20 reps  Seated Long Arc Quad - 1 x daily - 7 x weekly - 3 sets - 10 reps  Standing March with Counter Support - 3 x daily - 7 x weekly - 1 sets - 10 reps  Heel rises with counter support - 3 x daily - 7 x weekly - 1 sets - 10 reps  Standing Hip Abduction with Counter Support - 3 x daily - 7 x weekly - 1 sets - 5 reps  Standing Hip Extension with Counter Support - 3 x daily - 7 x weekly - 1 sets - 5 reps  Mini Squat with Counter Support - 3 x daily - 7 x weekly - 1 sets - 5 reps

## 2020-02-20 NOTE — Therapy (Signed)
[facility] [address] [place], [place], [address] Phone: [phone]   Fax:  [phone]  Physical Therapy Treatment  Patient Details  Name: Sally Wise MRN: [id number] Date of Birth: 12-03-1945 Referring Provider (PT): Suella Broad, MD   Encounter Date: 02/20/2020   PT End of Session - 02/20/20 0935    Visit Number 8    Date for PT Re-Evaluation 02/28/20    Authorization Type BCBS    Authorization Time Period 12/29/19 to 03/30/20    PT Start Time 0930    PT Stop Time 1010    PT Time Calculation (min) 40 min    Activity Tolerance Patient tolerated treatment well;No increased pain    Behavior During Therapy WFL for tasks assessed/performed           Past Medical History:  Diagnosis Date  . Hematuria    evaluation with Dr. Joelyn Oms  . Hypothyroidism 06/2010  . Kidney stone 04/2004  . Lichen sclerosus et atrophicus 2/13   biopsy proven  . LVH (left ventricular hypertrophy)   . Morbid obesity ([place])   . PAF (paroxysmal atrial fibrillation) ([place])    a. Remote hx, re-established care 01/2013 with Dr. Johnsie Cancel for recurrence. 2D echo 01/2013: mod LVH, EF 55-65%, no RWMA, grade 1 d/d. b. 06/2014: started on Xarelto.   . Thrombocytopenia ([place])   . Vitamin D deficiency 06/2010  . Wears glasses     Past Surgical History:  Procedure Laterality Date  . BREAST EXCISIONAL BIOPSY  2010   left--was a vascular lesion  . CARPAL TUNNEL RELEASE Left 08/09/2013   Procedure: LEFT CARPAL TUNNEL RELEASE;  Surgeon: Cammie Sickle., MD;  Location: [place];  Service: Orthopedics;  Laterality: Left;  . CATARACT EXTRACTION    . COLONOSCOPY  04/2003      . DILATION AND CURETTAGE OF UTERUS    . HYSTEROSCOPY WITH D & C  8/99  . 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 TONSILLECTOMY    . TRIGGER FINGER RELEASE Left 08/09/2013   Procedure: LEFT A1/A2 PULLEY RELEASE;  Surgeon: Cammie Sickle., MD;   Location: [place];  Service: Orthopedics;  Laterality: Left;  . TUBAL LIGATION  5/77    There were no vitals filed for this visit.   Subjective Assessment - 02/20/20 0931    Subjective I feel so anxious about going out in community without walker for fear of falling or having trouble getting out of a chair. I would love to get rid of the walker. I walk in fear.    Pertinent History B TKA, thromboctopenia, morbid obesity    How long can you sit comfortably? unlimited    How long can you stand comfortably? a few minutes due to Rt LE fatigue    How long can you walk comfortably? short indoor distances only    Diagnostic tests MRI: per pt MD states "nothing stood out"    Patient Stated Goals find comfort when sitting    Currently in Pain? No/denies    Pain Onset 1 to 4 weeks ago                             [facility] Adult PT Treatment/Exercise - 02/20/20 0001      Exercises   Exercises Knee/Hip      Knee/Hip Exercises: Standing   Heel Raises Both;10 reps  Other Standing Knee Exercises marching at counter x 20 alt bil UE support    Other Standing Knee Exercises hip abd and ext toe taps 1x5 bil      Knee/Hip Exercises: Seated   Long Arc Quad Strengthening;Both;3 sets;5 reps    Long Arc Quad Limitations red band around ankles to try for HEP    Other Seated Knee/Hip Exercises heel/toe raises bil x 20 reps    Sit to Sand 2 sets;5 reps;with UE support   from black pad, PT TCs "sits bones together"                 PT Education - 02/20/20 1008    Education Details Access Code: [email or web address]    Person(s) Educated Patient    Methods Explanation;Demonstration;Verbal cues;Handout    Comprehension Verbalized understanding;Returned demonstration;Verbal cues required;Tactile cues required            PT Short Term Goals - 01/24/20 1318      PT SHORT TERM GOAL #1   Title Pt will be independent with her initial HEP.    Baseline cannot do heel slide  (too difficult)    Status Partially Met             PT Long Term Goals - 02/16/20 1025      PT LONG TERM GOAL #1   Title Pt will be able to complete sit to/from supine transition with no more than CGA for safety.    Time 8    Period Weeks    Status New      PT LONG TERM GOAL #2   Title Pt will be able to complete sit to stand x5 reps from standard chair using arm rests.    Time 8    Period Weeks    Status New      PT LONG TERM GOAL #3   Title Pt will have Rt hip flexion strength of atleast 4/5 MMT.    Time 8    Period Weeks    Status New      PT LONG TERM GOAL #4   Title Pt will be able to step up onto 6" step x5 reps with no more than 1 handrail support.    Time 8    Period Weeks    Status New      PT LONG TERM GOAL #5   Title Pt will report atleast 20% improvement in her pain from the start of PT.    Time 8    Period Weeks                 Plan - 02/20/20 1010    Clinical Impression Statement PT and Pt discussed a need for increased cued movement throughout the day.  Pt is very eager to get rid of walker but doesn't trust her strength to do so in the community.  She uses max effort to perform sit to stand which makes toileting a challenge.  PT progressed HEP for several seated progressions and developed a counter standing LE strength program today.  Pt was able to find her gluts with TC/VCs to bring sits bones together which helped her with sit to stand.  She understands the benefits of smaller circuits of ther ex throughout her day to progress strength and improve functional recruitment for transfers.  Continue along POC.    Comorbidities morbid obesity    PT Frequency 2x / week    PT Duration 8 weeks    PT Treatment/Interventions  ADLs/Self Care Home Management;Aquatic Therapy;Electrical Stimulation;Cryotherapy;Moist Heat;DME Instruction;Stair training;Functional mobility training;Therapeutic activities;Neuromuscular re-education;Therapeutic  exercise;Patient/family education;Balance training;Manual techniques;Dry needling;Passive range of motion    PT Next Visit Plan f/u on standing HEP added last time, work on sit to stand from black pad and without, gait training with heel/toe pattern and glut activation    PT Home Exercise Plan Access Code [email or web address]    Consulted and Agree with Plan of Care Patient           Patient will benefit from skilled therapeutic intervention in order to improve the following deficits and impairments:     Visit Diagnosis: Right low back pain, unspecified chronicity, unspecified whether sciatica present  Muscle weakness (generalized)  Other abnormalities of gait and mobility     Problem List Patient Active Problem List   Diagnosis Date Noted  . 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 Lichen sclerosus et atrophicus 01/04/2015  . LVH (left ventricular hypertrophy)   . PAF (paroxysmal atrial fibrillation) ([place]) 07/06/2014  . Thrombocytopenia ([place]) 07/06/2014  . Hyperglycemia 07/06/2014  . Morbid obesity ([place]) 01/25/2013  . Hypothyroidism 01/15/2013  . History of total knee replacement, left 08/09/2007    Baruch Merl, PT 02/20/20 10:15 AM   [facility] [address], [place], [place], [address] Phone: [phone]   Fax:  [phone]  Name: CANDISS GALEANA MRN: [id number] Date of Birth: 08/14/1945

## 2020-02-22 ENCOUNTER — Other Ambulatory Visit: Payer: Self-pay

## 2020-02-22 ENCOUNTER — Encounter: Payer: Self-pay | Admitting: Physical Therapy

## 2020-02-22 ENCOUNTER — Ambulatory Visit: Payer: Medicare Other | Admitting: Physical Therapy

## 2020-02-22 DIAGNOSIS — R2689 Other abnormalities of gait and mobility: Secondary | ICD-10-CM

## 2020-02-22 DIAGNOSIS — M6281 Muscle weakness (generalized): Secondary | ICD-10-CM | POA: Diagnosis not present

## 2020-02-22 DIAGNOSIS — M545 Low back pain, unspecified: Secondary | ICD-10-CM

## 2020-02-22 NOTE — Patient Instructions (Signed)
Access Code: [email or web address] URL: [email or web address] Date: 02/22/2020 Prepared by: [facility]  Exercises .Supine Heel Slide with Strap - 2 x daily - 7 x weekly - 2 sets - 10 reps .Seated Long Arc Quad - 1 x daily - 7 x weekly - 3 sets - 10 reps .Standing March with Counter Support - 3 x daily - 7 x weekly - 1 sets - 10 reps .Heel rises with counter support - 3 x daily - 7 x weekly - 1 sets - 10 reps .Standing Hip Abduction with Counter Support - 3 x daily - 7 x weekly - 1 sets - 5 reps .Standing Hip Extension with Counter Support - 3 x daily - 7 x weekly - 1 sets - 5 reps .Mini Squat with Counter Support - 3 x daily - 7 x weekly - 1 sets - 5 reps   [facility] [address], [address] [place], [place] [address] Phone # [phone] Fax [phone]

## 2020-02-23 NOTE — Therapy (Signed)
[facility] [address] [place], [place], [address] Phone: [phone]   Fax:  [phone]  Physical Therapy Treatment  Patient Details  Name: Sally Wise MRN: [id number] Date of Birth: October 10, 1945 Referring Provider (PT): Suella Broad, MD   Encounter Date: 02/22/2020   PT End of Session - 02/22/20 1253    Visit Number 9    Date for PT Re-Evaluation 02/28/20    Authorization Type BCBS    Authorization Time Period 12/29/19 to 03/30/20    PT Start Time 1232    PT Stop Time 1314    PT Time Calculation (min) 42 min    Activity Tolerance Patient tolerated treatment well;No increased pain    Behavior During Therapy [facility] for tasks assessed/performed           Past Medical History:  Diagnosis Date   Hematuria    evaluation with Dr. Joelyn Oms   Hypothyroidism 06/2010   Kidney stone 34/7425   Lichen sclerosus et atrophicus 2/13   biopsy proven   LVH (left ventricular hypertrophy)    Morbid obesity ([facility])    PAF (paroxysmal atrial fibrillation) ([place])    a. Remote hx, re-established care 01/2013 with Dr. Johnsie Cancel for recurrence. 2D echo 01/2013: mod LVH, EF 55-65%, no RWMA, grade 1 d/d. b. 06/2014: started on Xarelto.    Thrombocytopenia ([place])    Vitamin D deficiency 06/2010   Wears glasses     Past Surgical History:  Procedure Laterality Date   BREAST EXCISIONAL BIOPSY  2010   left--was a vascular lesion   CARPAL TUNNEL RELEASE Left 08/09/2013   Procedure: LEFT CARPAL TUNNEL RELEASE;  Surgeon: Cammie Sickle., MD;  Location: [place];  Service: Orthopedics;  Laterality: Left;   CATARACT EXTRACTION     COLONOSCOPY  04/2003       DILATION AND CURETTAGE OF UTERUS     HYSTEROSCOPY WITH D & C  8/99   REPLACEMENT TOTAL KNEE Bilateral 12/2006  07/2007    TONSILLECTOMY     TRIGGER FINGER RELEASE Left 08/09/2013   Procedure: LEFT A1/A2 PULLEY RELEASE;  Surgeon: Cammie Sickle., MD;   Location: [facility];  Service: Orthopedics;  Laterality: Left;   TUBAL LIGATION  5/77    There were no vitals filed for this visit.   Subjective Assessment - 02/22/20 1252    Subjective Pt states that things are going ok. She is frustrated with her leg getting tired. She is wondering if she should try to use a cane.    Pertinent History B TKA, thromboctopenia, morbid obesity    How long can you sit comfortably? unlimited    How long can you stand comfortably? a few minutes due to Rt LE fatigue    How long can you walk comfortably? short indoor distances only    Diagnostic tests MRI: per pt MD states "nothing stood out"    Patient Stated Goals find comfort when sitting    Currently in Pain? No/denies    Pain Onset 1 to 4 weeks ago                             [facility] Adult PT Treatment/Exercise - 02/23/20 0001      Knee/Hip Exercises: Standing   Other Standing Knee Exercises step over back- foam roll with B UE support 2x5 reps (Rt knee hyperextended); side step over/back x5 reps each  Knee/Hip Exercises: Seated   Long Arc Quad Strengthening;Right;1 set    [employer] Limitations x8 reps red TB around feet     Heel Slides Right;Strengthening;3 sets;10 reps    Heel Slides Limitations blue TB                  PT Education - 02/22/20 1315    Education Details technique with therex    Person(s) Educated Patient    Methods Explanation;Verbal cues;Handout    Comprehension Verbalized understanding;Returned demonstration            PT Short Term Goals - 01/24/20 1318      PT SHORT TERM GOAL #1   Title Pt will be independent with her initial HEP.    Baseline cannot do heel slide (too difficult)    Status Partially Met             PT Long Term Goals - 02/16/20 1025      PT LONG TERM GOAL #1   Title Pt will be able to complete sit to/from supine transition with no more than CGA for safety.    Time 8    Period Weeks     Status New      PT LONG TERM GOAL #2   Title Pt will be able to complete sit to stand x5 reps from standard chair using arm rests.    Time 8    Period Weeks    Status New      PT LONG TERM GOAL #3   Title Pt will have Rt hip flexion strength of atleast 4/5 MMT.    Time 8    Period Weeks    Status New      PT LONG TERM GOAL #4   Title Pt will be able to step up onto 6" step x5 reps with no more than 1 handrail support.    Time 8    Period Weeks    Status New      PT LONG TERM GOAL #5   Title Pt will report atleast 20% improvement in her pain from the start of PT.    Time 8    Period Weeks                 Plan - 02/23/20 2774    Clinical Impression Statement Pt demonstrates improved mobility since her evaluation. She is now regularly sitting in a standard chair with armrests, rather than requiring a high/low table. She demonstrates no difficulty with sit to stand while using UE support throughout her session. PT provided reassurance to the pt today regarding her progress and expectations moving forward. Pt is hesitant to step with the Lt LE over a hurdle secondary to poor Rt LE stability, but she was able to step over a foam roll with the Rt due to improved active hip flexion.    Comorbidities morbid obesity    PT Frequency 2x / week    PT Duration 8 weeks    PT Treatment/Interventions ADLs/Self Care Home Management;Aquatic Therapy;Electrical Stimulation;Cryotherapy;Moist Heat;DME Instruction;Stair training;Functional mobility training;Therapeutic activities;Neuromuscular re-education;Therapeutic exercise;Patient/family education;Balance training;Manual techniques;Dry needling;Passive range of motion    PT Next Visit Plan f/u on standing HEP added last time, work on sit to stand from black pad and without, glute activation during step, heel raise    PT Home Exercise Plan Access Code [email or web address]    Consulted and Agree with Plan of Care Patient  Patient will benefit  from skilled therapeutic intervention in order to improve the following deficits and impairments:     Visit Diagnosis: Right low back pain, unspecified chronicity, unspecified whether sciatica present  Muscle weakness (generalized)  Other abnormalities of gait and mobility     Problem List Patient Active Problem List   Diagnosis Date Noted   History of total knee replacement, right 46/27/0350   Lichen sclerosus et atrophicus 01/04/2015   LVH (left ventricular hypertrophy)    PAF (paroxysmal atrial fibrillation) ([place]) 07/06/2014   Thrombocytopenia ([place]) 07/06/2014   Hyperglycemia 07/06/2014   Morbid obesity ([place]) 01/25/2013   Hypothyroidism 01/15/2013   History of total knee replacement, left 08/09/2007   7:59 AM,02/23/20 Sherol Dade PT, DPT [facility] [address], [place], [place], [address] Phone: [phone]   Fax:  [phone]  Name: ANNA-MARIE COLLER MRN: [id number] Date of Birth: [birth year]

## 2020-03-07 ENCOUNTER — Other Ambulatory Visit: Payer: Self-pay

## 2020-03-07 ENCOUNTER — Encounter: Payer: Self-pay | Admitting: Physical Therapy

## 2020-03-07 ENCOUNTER — Ambulatory Visit: Payer: Medicare Other | Admitting: Physical Therapy

## 2020-03-07 DIAGNOSIS — M545 Low back pain, unspecified: Secondary | ICD-10-CM

## 2020-03-07 DIAGNOSIS — M6281 Muscle weakness (generalized): Secondary | ICD-10-CM | POA: Diagnosis not present

## 2020-03-07 DIAGNOSIS — R2689 Other abnormalities of gait and mobility: Secondary | ICD-10-CM

## 2020-03-07 NOTE — Therapy (Signed)
[facility] [address] [place], [place], [address] Phone: [phone]   Fax:  [phone]  Physical Therapy Treatment  Patient Details  Name: Sally Wise MRN: [id number] Date of Birth: 1945/10/30 Referring Provider (PT): Suella Broad, MD  Progress Note Reporting Period 12/29/19 to 03/07/20  See note below for Objective Data and Assessment of Progress/Goals.      Encounter Date: 03/07/2020   PT End of Session - 03/07/20 0911    Visit Number 10    Date for PT Re-Evaluation 03/30/20    Authorization Type BCBS    Authorization Time Period 12/29/19 to 03/30/20    PT Start Time 0845    PT Stop Time 0928    PT Time Calculation (min) 43 min    Activity Tolerance Patient tolerated treatment well;No increased pain    Behavior During Therapy WFL for tasks assessed/performed           Past Medical History:  Diagnosis Date  . Hematuria    evaluation with Dr. Joelyn Oms  . Hypothyroidism 06/2010  . Kidney stone 04/2004  . Lichen sclerosus et atrophicus 2/13   biopsy proven  . LVH (left ventricular hypertrophy)   . Morbid obesity ([place])   . PAF (paroxysmal atrial fibrillation) ([place])    a. Remote hx, re-established care 01/2013 with Dr. Johnsie Cancel for recurrence. 2D echo 01/2013: mod LVH, EF 55-65%, no RWMA, grade 1 d/d. b. 06/2014: started on Xarelto.   . Thrombocytopenia ([place])   . Vitamin D deficiency 06/2010  . Wears glasses     Past Surgical History:  Procedure Laterality Date  . BREAST EXCISIONAL BIOPSY  2010   left--was a vascular lesion  . CARPAL TUNNEL RELEASE Left 08/09/2013   Procedure: LEFT CARPAL TUNNEL RELEASE;  Surgeon: Cammie Sickle., MD;  Location: [facility];  Service: Orthopedics;  Laterality: Left;  . CATARACT EXTRACTION    . COLONOSCOPY  04/2003      . DILATION AND CURETTAGE OF UTERUS    . HYSTEROSCOPY WITH D & C  8/99  . 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  TONSILLECTOMY    . TRIGGER FINGER RELEASE Left 08/09/2013   Procedure: LEFT A1/A2 PULLEY RELEASE;  Surgeon: Cammie Sickle., MD;  Location: [facility];  Service: Orthopedics;  Laterality: Left;  . TUBAL LIGATION  5/77    There were no vitals filed for this visit.   Subjective Assessment - 03/07/20 0847    Subjective Pt states that things are going well. She is now using the cane.    Pertinent History B TKA, thromboctopenia, morbid obesity    How long can you sit comfortably? unlimited    How long can you stand comfortably? a few minutes due to Rt LE fatigue    How long can you walk comfortably? short indoor distances only    Diagnostic tests MRI: per pt MD states "nothing stood out"    Patient Stated Goals be able to get up from lower chairs    Currently in Pain? No/denies    Pain Onset 1 to 4 weeks ago                             [facility] Adult PT Treatment/Exercise - 03/07/20 0001      Knee/Hip Exercises: Standing   Hip Abduction Stengthening;Right;Left;2 sets;5 reps    Abduction Limitations yellow TB around  feet     Forward Step Up Right;2 sets;10 reps;Hand Hold: 2;Step Height: 4"    Forward Step Up Limitations x2 reps on Rt 6" step (+) knee buckling      Knee/Hip Exercises: Seated   Other Seated Knee/Hip Exercises Rt ankle plantarflexion black TB 2x20 reps                  PT Education - 03/07/20 0911    Education Details updated HEP    Person(s) Educated Patient    Methods Explanation;Handout;Verbal cues    Comprehension Verbalized understanding;Returned demonstration            PT Short Term Goals - 03/07/20 0911      PT SHORT TERM GOAL #1   Title Pt will be independent with her initial HEP.    Baseline cannot do heel slide (too difficult)    Status Partially Met             PT Long Term Goals - 03/07/20 0911      PT LONG TERM GOAL #1   Title Pt will be able to complete sit to/from supine transition with no more  than CGA for safety.    Time 8    Period Weeks    Status On-going      PT LONG TERM GOAL #2   Title Pt will be able to complete sit to stand x5 reps from standard chair using arm rests.    Time 8    Period Weeks    Status Achieved      PT LONG TERM GOAL #3   Title Pt will have Rt hip flexion strength of atleast 4/5 MMT.    Baseline able to lift through gravity without resistance    Time 8    Period Weeks    Status Partially Met      PT LONG TERM GOAL #4   Title Pt will be able to step up onto 6" step x5 reps with no more than 1 handrail support.    Baseline 6" step with 2 handrails (+) Rt knee buckling    Time 8    Period Weeks    Status Partially Met      PT LONG TERM GOAL #5   Title Pt will report atleast 20% improvement in her pain from the start of PT.    Baseline pain 100% improved    Time 8    Period Weeks    Status Achieved                 Plan - 03/07/20 0935    Clinical Impression Statement Pt is making steady progress towards her goals. She has improved mobility, and she is now ambulating with a SPC. She can get in and out of her car with improved ease. Her pain is 100% improved. She still has difficulty with getting out of lower chairs in the community and has evident gastroc/hip weakness. PT provides tactile and verbal cuing to improve technique with heel raises secondary to knee flexion compensation. Pt's HEP was updated to further promote LE strengthening. She would continue to benefit from skilled PT moving forward to address mobility limitations and LE strength/endurance deficits.    Comorbidities morbid obesity    PT Frequency 2x / week    PT Duration 8 weeks    PT Treatment/Interventions ADLs/Self Care Home Management;Aquatic Therapy;Electrical Stimulation;Cryotherapy;Moist Heat;DME Instruction;Stair training;Functional mobility training;Therapeutic activities;Neuromuscular re-education;Therapeutic exercise;Patient/family education;Balance  training;Manual techniques;Dry needling;Passive range of motion  PT Next Visit Plan hip abductor/gastroc strenght; work on sit to stand from black pad and without    PT Home Exercise Plan Access Code [email or web address]    Consulted and Agree with Plan of Care Patient           Patient will benefit from skilled therapeutic intervention in order to improve the following deficits and impairments:     Visit Diagnosis: Right low back pain, unspecified chronicity, unspecified whether sciatica present  Muscle weakness (generalized)  Other abnormalities of gait and mobility     Problem List Patient Active Problem List   Diagnosis Date Noted  . 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 Lichen sclerosus et atrophicus 01/04/2015  . LVH (left ventricular hypertrophy)   . PAF (paroxysmal atrial fibrillation) ([place]) 07/06/2014  . Thrombocytopenia ([place]) 07/06/2014  . Hyperglycemia 07/06/2014  . Morbid obesity ([place]) 01/25/2013  . Hypothyroidism 01/15/2013  . History of total knee replacement, left 08/09/2007    9:43 AM,03/07/20 Sherol Dade PT, DPT [facility] [address] [place], [place], [address] Phone: [phone]   Fax:  [phone]  Name: Sally Wise MRN: [id number] Date of Birth: 10/19/1945

## 2020-03-07 NOTE — Patient Instructions (Signed)
Access Code: [email or web address] URL: [email or web address] Date: 03/07/2020 Prepared by: [facility]  Exercises .Supine Heel Slide with Strap - 2 x daily - 7 x weekly - 2 sets - 10 reps .Standing March with Counter Support - 3 x daily - 7 x weekly - 1 sets - 10 reps .Heel rises with counter support - 3 x daily - 7 x weekly - 1 sets - 10 reps .Standing Hip Abduction with Counter Support - 3 x daily - 7 x weekly - 1 sets - 5 reps .Mini Squat with Counter Support - 3 x daily - 7 x weekly - 1 sets - 5 reps .Forward Step Up with Unilateral Counter Support - 1 x daily - 7 x weekly - 2 sets - 10 reps    [facility] [address] [place], [place] [address] Phone # [phone] Fax [phone]

## 2020-03-15 ENCOUNTER — Ambulatory Visit: Payer: Medicare Other | Attending: Physical Medicine and Rehabilitation | Admitting: Physical Therapy

## 2020-03-15 ENCOUNTER — Other Ambulatory Visit: Payer: Self-pay

## 2020-03-15 ENCOUNTER — Encounter: Payer: Self-pay | Admitting: Physical Therapy

## 2020-03-15 DIAGNOSIS — M545 Low back pain, unspecified: Secondary | ICD-10-CM

## 2020-03-15 DIAGNOSIS — M6281 Muscle weakness (generalized): Secondary | ICD-10-CM | POA: Diagnosis not present

## 2020-03-15 DIAGNOSIS — R2689 Other abnormalities of gait and mobility: Secondary | ICD-10-CM

## 2020-03-15 NOTE — Patient Instructions (Signed)
Access Code: [email or web address]: [email or web address]: 09/02/2021Prepared by: [facility]  Supine Heel Slide with Strap - 2 x daily - 7 x weekly - 2 sets - 10 reps  Standing March with Counter Support - 3 x daily - 7 x weekly - 1 sets - 10 reps  Heel rises with counter support - 3 x daily - 7 x weekly - 1 sets - 10 reps  Standing Hip Abduction with Counter Support - 3 x daily - 7 x weekly - 1 sets - 5 reps  Mini Squat with Counter Support - 3 x daily - 7 x weekly - 1 sets - 5 reps  Forward Step Up with Unilateral Counter Support - 1 x daily - 7 x weekly - 2 sets - 10 reps  [facility] [address] [place], [place] [address] Phone # [phone] Fax [phone]

## 2020-03-15 NOTE — Therapy (Signed)
[facility] [address], [place], [place], [address] Phone: [phone]   Fax:  [phone]  Physical Therapy Treatment  Patient Details  Name: Sally Wise MRN: [id number] Date of Birth: June 23, 1946 Referring Provider (PT): Suella Broad, MD   Encounter Date: 03/15/2020   PT End of Session - 03/15/20 1012    Visit Number 11    Date for PT Re-Evaluation 03/30/20    Authorization Type BCBS    Authorization Time Period 12/29/19 to 03/30/20    PT Start Time 0932    PT Stop Time 1013    PT Time Calculation (min) 41 min    Activity Tolerance Patient tolerated treatment well;No increased pain    Behavior During Therapy [facility] for tasks assessed/performed           Past Medical History:  Diagnosis Date   Hematuria    evaluation with Dr. Joelyn Oms   Hypothyroidism 06/2010   Kidney stone 89/3734   Lichen sclerosus et atrophicus 2/13   biopsy proven   LVH (left ventricular hypertrophy)    Morbid obesity (HCC)    PAF (paroxysmal atrial fibrillation) ([place])    a. Remote hx, re-established care 01/2013 with Dr. Johnsie Cancel for recurrence. 2D echo 01/2013: mod LVH, EF 55-65%, no RWMA, grade 1 d/d. b. 06/2014: started on Xarelto.    Thrombocytopenia ([place])    Vitamin D deficiency 06/2010   Wears glasses     Past Surgical History:  Procedure Laterality Date   BREAST EXCISIONAL BIOPSY  2010   left--was a vascular lesion   CARPAL TUNNEL RELEASE Left 08/09/2013   Procedure: LEFT CARPAL TUNNEL RELEASE;  Surgeon: Cammie Sickle., MD;  Location: [place];  Service: Orthopedics;  Laterality: Left;   CATARACT EXTRACTION     COLONOSCOPY  04/2003       DILATION AND CURETTAGE OF UTERUS     HYSTEROSCOPY WITH D & C  8/99   REPLACEMENT TOTAL KNEE Bilateral 12/2006  07/2007    TONSILLECTOMY     TRIGGER FINGER RELEASE Left 08/09/2013   Procedure: LEFT A1/A2 PULLEY RELEASE;  Surgeon: Cammie Sickle., MD;   Location: [place];  Service: Orthopedics;  Laterality: Left;   TUBAL LIGATION  5/77    There were no vitals filed for this visit.   Subjective Assessment - 03/15/20 0934    Subjective Pt is discouraged by her slow progress. She is still worried about her difficulty getting out of chairs.    Pertinent History B TKA, thromboctopenia, morbid obesity    How long can you sit comfortably? unlimited    How long can you stand comfortably? a few minutes due to Rt LE fatigue    How long can you walk comfortably? short indoor distances only    Diagnostic tests MRI: per pt MD states "nothing stood out"    Patient Stated Goals be able to get up from lower chairs    Currently in Pain? No/denies    Pain Onset 1 to 4 weeks ago                             [facility] Adult PT Treatment/Exercise - 03/15/20 0001      Knee/Hip Exercises: Stretches   Gastroc Stretch Left;3 reps;20 seconds    Gastroc Stretch Limitations standing on slant board       Knee/Hip Exercises: Standing   Heel Raises Both;2 sets;10  reps    Forward Step Up Right;Left;3 sets;5 reps;Hand Hold: 2;Step Height: 4"    Forward Step Up Limitations BUE support     Other Standing Knee Exercises weight shifting Lt/Rt with UE support on foam pad x60sec      Knee/Hip Exercises: Seated   Clamshell with [employer]   single leg 2x20 reps      Knee/Hip Exercises: Sidelying   Hip ABduction Limitations unable to lift Rt against gravity    Clams 2x10 reps Rt only       Manual Therapy   Soft tissue mobilization addaday Rt glute med and TFL                  PT Education - 03/15/20 1011    Education Details updated HEP heel raises    Person(s) Educated Patient    Methods Explanation;Handout;Verbal cues    Comprehension Verbalized understanding;Returned demonstration            PT Short Term Goals - 03/07/20 0911      PT SHORT TERM GOAL #1   Title Pt will be independent with her initial  HEP.    Baseline cannot do heel slide (too difficult)    Status Partially Met             PT Long Term Goals - 03/07/20 0911      PT LONG TERM GOAL #1   Title Pt will be able to complete sit to/from supine transition with no more than CGA for safety.    Time 8    Period Weeks    Status On-going      PT LONG TERM GOAL #2   Title Pt will be able to complete sit to stand x5 reps from standard chair using arm rests.    Time 8    Period Weeks    Status Achieved      PT LONG TERM GOAL #3   Title Pt will have Rt hip flexion strength of atleast 4/5 MMT.    Baseline able to lift through gravity without resistance    Time 8    Period Weeks    Status Partially Met      PT LONG TERM GOAL #4   Title Pt will be able to step up onto 6" step x5 reps with no more than 1 handrail support.    Baseline 6" step with 2 handrails (+) Rt knee buckling    Time 8    Period Weeks    Status Partially Met      PT LONG TERM GOAL #5   Title Pt will report atleast 20% improvement in her pain from the start of PT.    Baseline pain 100% improved    Time 8    Period Weeks    Status Achieved                 Plan - 03/15/20 1022    Clinical Impression Statement Pt arrived with her [facility] and reports that she is now using this less throughout the day. She had an episode of her Rt knee buckling without LOB over the weekend. Session continued with emphasis on increasing Rt LE strength. Pt was able to complete 2 sets of heel raises, but demonstrated weight shift compensation secondary to Rt gastroc fatigue. PT encouraged pt to complete her entire HEP moving forward.    Comorbidities morbid obesity    PT Frequency 2x / week    PT Duration 8 weeks  PT Treatment/Interventions ADLs/Self Care Home Management;Aquatic Therapy;Electrical Stimulation;Cryotherapy;Moist Heat;DME Instruction;Stair training;Functional mobility training;Therapeutic activities;Neuromuscular re-education;Therapeutic  exercise;Patient/family education;Balance training;Manual techniques;Dry needling;Passive range of motion    PT Next Visit Plan hip abductor/gastroc strenght; work on sit to stand from [employer] pad and without    PT Home Exercise Plan Access Code [email or web address]    Consulted and Agree with Plan of Care Patient           Patient will benefit from skilled therapeutic intervention in order to improve the following deficits and impairments:     Visit Diagnosis: Right low back pain, unspecified chronicity, unspecified whether sciatica present  Muscle weakness (generalized)  Other abnormalities of gait and mobility     Problem List Patient Active Problem List   Diagnosis Date Noted   History of total knee replacement, right 89/84/2103   Lichen sclerosus et atrophicus 01/04/2015   LVH (left ventricular hypertrophy)    PAF (paroxysmal atrial fibrillation) ([place]) 07/06/2014   Thrombocytopenia ([place]) 07/06/2014   Hyperglycemia 07/06/2014   Morbid obesity ([place]) 01/25/2013   Hypothyroidism 01/15/2013   History of total knee replacement, left 08/09/2007    Sherol Dade 03/15/2020, 11:08 AM  [facility] [address] [place], [place], [address] Phone: [phone]   Fax:  [phone]  Name: AZUL COFFIE MRN: [id number] Date of Birth: 1945-10-20

## 2020-03-21 ENCOUNTER — Encounter: Payer: Self-pay | Admitting: Physical Therapy

## 2020-03-21 ENCOUNTER — Other Ambulatory Visit: Payer: Self-pay

## 2020-03-21 ENCOUNTER — Ambulatory Visit: Payer: Medicare Other | Admitting: Physical Therapy

## 2020-03-21 DIAGNOSIS — R2689 Other abnormalities of gait and mobility: Secondary | ICD-10-CM | POA: Diagnosis not present

## 2020-03-21 DIAGNOSIS — M6281 Muscle weakness (generalized): Secondary | ICD-10-CM

## 2020-03-21 DIAGNOSIS — M545 Low back pain, unspecified: Secondary | ICD-10-CM

## 2020-03-21 NOTE — Therapy (Signed)
[facility] [address] [place], [place], [address] Phone: [phone]   Fax:  [phone]  Physical Therapy Treatment  Patient Details  Name: Sally Wise MRN: [id number] Date of Birth: 08-20-1945 Referring Provider (PT): Suella Broad, MD   Encounter Date: 03/21/2020   PT End of Session - 03/21/20 1000    Visit Number 12    Date for PT Re-Evaluation 03/30/20    Authorization Type BCBS    Authorization Time Period 12/29/19 to 03/30/20    PT Start Time 0846    PT Stop Time 0930    PT Time Calculation (min) 44 min    Activity Tolerance Patient tolerated treatment well;No increased pain    Behavior During Therapy WFL for tasks assessed/performed           Past Medical History:  Diagnosis Date  . Hematuria    evaluation with Dr. Joelyn Oms  . Hypothyroidism 06/2010  . Kidney stone 04/2004  . Lichen sclerosus et atrophicus 2/13   biopsy proven  . LVH (left ventricular hypertrophy)   . Morbid obesity ([place])   . PAF (paroxysmal atrial fibrillation) ([place])    a. Remote hx, re-established care 01/2013 with Dr. Johnsie Cancel for recurrence. 2D echo 01/2013: mod LVH, EF 55-65%, no RWMA, grade 1 d/d. b. 06/2014: started on Xarelto.   . Thrombocytopenia ([place])   . Vitamin D deficiency 06/2010  . Wears glasses     Past Surgical History:  Procedure Laterality Date  . BREAST EXCISIONAL BIOPSY  2010   left--was a vascular lesion  . CARPAL TUNNEL RELEASE Left 08/09/2013   Procedure: LEFT CARPAL TUNNEL RELEASE;  Surgeon: Cammie Sickle., MD;  Location: [place];  Service: Orthopedics;  Laterality: Left;  . CATARACT EXTRACTION    . COLONOSCOPY  04/2003      . DILATION AND CURETTAGE OF UTERUS    . HYSTEROSCOPY WITH D & C  8/99  . 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 TONSILLECTOMY    . TRIGGER FINGER RELEASE Left 08/09/2013   Procedure: LEFT A1/A2 PULLEY RELEASE;  Surgeon: Cammie Sickle., MD;   Location: [place];  Service: Orthopedics;  Laterality: Left;  . TUBAL LIGATION  5/77    There were no vitals filed for this visit.   Subjective Assessment - 03/21/20 0847    Subjective Pt states her exercises are going well at home. She has difficulty with her step up because of the hand placement.    Pertinent History B TKA, thromboctopenia, morbid obesity    How long can you sit comfortably? unlimited    How long can you stand comfortably? a few minutes due to Rt LE fatigue    How long can you walk comfortably? short indoor distances only    Diagnostic tests MRI: per pt MD states "nothing stood out"    Patient Stated Goals be able to get up from lower chairs    Currently in Pain? No/denies    Pain Onset 1 to 4 weeks ago                             [facility] Adult PT Treatment/Exercise - 03/21/20 0001      Knee/Hip Exercises: Standing   Hip Extension Stengthening;Both;2 sets;10 reps;Knee straight    Extension Limitations yellow TB around ankles     Forward Step Up 2 sets;Both;10 reps;Hand Hold: 2;Step  Height: 4"    Forward Step Up Limitations 2nd set with 6" box- Lt; 3rd set on Rt       Knee/Hip Exercises: Seated   Heel Slides Strengthening;Right;Left;2 sets;10 reps    Heel Slides Limitations blue TB                   PT Education - 03/21/20 1000    Education Details technique with therex; upcoming reassessment    Person(s) Educated Patient    Methods Explanation;Verbal cues;Tactile cues    Comprehension Verbalized understanding;Returned demonstration            PT Short Term Goals - 03/07/20 0911      PT SHORT TERM GOAL #1   Title Pt will be independent with her initial HEP.    Baseline cannot do heel slide (too difficult)    Status Partially Met             PT Long Term Goals - 03/07/20 0911      PT LONG TERM GOAL #1   Title Pt will be able to complete sit to/from supine transition with no more than CGA for safety.     Time 8    Period Weeks    Status On-going      PT LONG TERM GOAL #2   Title Pt will be able to complete sit to stand x5 reps from standard chair using arm rests.    Time 8    Period Weeks    Status Achieved      PT LONG TERM GOAL #3   Title Pt will have Rt hip flexion strength of atleast 4/5 MMT.    Baseline able to lift through gravity without resistance    Time 8    Period Weeks    Status Partially Met      PT LONG TERM GOAL #4   Title Pt will be able to step up onto 6" step x5 reps with no more than 1 handrail support.    Baseline 6" step with 2 handrails (+) Rt knee buckling    Time 8    Period Weeks    Status Partially Met      PT LONG TERM GOAL #5   Title Pt will report atleast 20% improvement in her pain from the start of PT.    Baseline pain 100% improved    Time 8    Period Weeks    Status Achieved                 Plan - 03/21/20 1005    Clinical Impression Statement Pt is regularly working on her HEP. She has some difficulty with her step ups at home due to the handrail placement. She was able to progress to 6 inch box today with BUE support. Her step ups on the Rt LE are improved, and she is able to step onto a 4 inch box. Pt has compensations of knee hyperextension during step ups with evident gluteal weakness. PT provided physical support at the knee to encourage glute activation during step up. Pt was able to complete this with improved technique following several sets.    Comorbidities morbid obesity    PT Frequency 2x / week    PT Duration 8 weeks    PT Treatment/Interventions ADLs/Self Care Home Management;Aquatic Therapy;Electrical Stimulation;Cryotherapy;Moist Heat;DME Instruction;Stair training;Functional mobility training;Therapeutic activities;Neuromuscular re-education;Therapeutic exercise;Patient/family education;Balance training;Manual techniques;Dry needling;Passive range of motion    PT Next Visit Plan hip abductor/gastroc strength; work  on  sit to stand from black pad and without; finalize HEP and consider hold at end of POC    PT Home Exercise Plan Access Code [email or web address]    Consulted and Agree with Plan of Care Patient           Patient will benefit from skilled therapeutic intervention in order to improve the following deficits and impairments:     Visit Diagnosis: Right low back pain, unspecified chronicity, unspecified whether sciatica present  Muscle weakness (generalized)  Other abnormalities of gait and mobility     Problem List Patient Active Problem List   Diagnosis Date Noted  . 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 Lichen sclerosus et atrophicus 01/04/2015  . LVH (left ventricular hypertrophy)   . PAF (paroxysmal atrial fibrillation) ([place]) 07/06/2014  . Thrombocytopenia ([place]) 07/06/2014  . Hyperglycemia 07/06/2014  . Morbid obesity ([place]) 01/25/2013  . Hypothyroidism 01/15/2013  . History of total knee replacement, left 08/09/2007    [facility] [address] [place],  [address] Phone # [phone] Fax [phone]  [facility] [address], [place], [place], [address] Phone: [phone]   Fax:  [phone]  Name: Sally Wise MRN: [id number] Date of Birth: 1945-11-30

## 2020-03-23 ENCOUNTER — Ambulatory Visit: Payer: Medicare Other | Admitting: Podiatry

## 2020-03-27 ENCOUNTER — Ambulatory Visit: Payer: Medicare Other | Admitting: Physical Therapy

## 2020-03-27 ENCOUNTER — Encounter: Payer: Self-pay | Admitting: Physical Therapy

## 2020-03-27 ENCOUNTER — Other Ambulatory Visit: Payer: Self-pay

## 2020-03-27 DIAGNOSIS — M6281 Muscle weakness (generalized): Secondary | ICD-10-CM | POA: Diagnosis not present

## 2020-03-27 DIAGNOSIS — R2689 Other abnormalities of gait and mobility: Secondary | ICD-10-CM

## 2020-03-27 DIAGNOSIS — M545 Low back pain, unspecified: Secondary | ICD-10-CM

## 2020-03-27 NOTE — Therapy (Signed)
[facility] [address] [place], [place], [address] Phone: [phone]   Fax:  [phone]  Physical Therapy Treatment  Patient Details  Name: Sally Wise MRN: [id number] Date of Birth: 1946-03-15 Referring Provider (PT): Suella Broad, MD   Encounter Date: 03/27/2020   PT End of Session - 03/27/20 1205    Visit Number 13    Date for PT Re-Evaluation 03/30/20    Authorization Type BCBS    Authorization Time Period 12/29/19 to 03/30/20    PT Start Time 1103    PT Stop Time 1145    PT Time Calculation (min) 42 min    Activity Tolerance Patient tolerated treatment well;No increased pain    Behavior During Therapy WFL for tasks assessed/performed           Past Medical History:  Diagnosis Date  . Hematuria    evaluation with Dr. Joelyn Oms  . Hypothyroidism 06/2010  . Kidney stone 04/2004  . Lichen sclerosus et atrophicus 2/13   biopsy proven  . LVH (left ventricular hypertrophy)   . Morbid obesity ([place])   . PAF (paroxysmal atrial fibrillation) ([place])    a. Remote hx, re-established care 01/2013 with Dr. Johnsie Cancel for recurrence. 2D echo 01/2013: mod LVH, EF 55-65%, no RWMA, grade 1 d/d. b. 06/2014: started on Xarelto.   . Thrombocytopenia ([place])   . Vitamin D deficiency 06/2010  . Wears glasses     Past Surgical History:  Procedure Laterality Date  . BREAST EXCISIONAL BIOPSY  2010   left--was a vascular lesion  . CARPAL TUNNEL RELEASE Left 08/09/2013   Procedure: LEFT CARPAL TUNNEL RELEASE;  Surgeon: Cammie Sickle., MD;  Location: [place];  Service: Orthopedics;  Laterality: Left;  . CATARACT EXTRACTION    . COLONOSCOPY  04/2003      . DILATION AND CURETTAGE OF UTERUS    . HYSTEROSCOPY WITH D & C  8/99  . 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 TONSILLECTOMY    . TRIGGER FINGER RELEASE Left 08/09/2013   Procedure: LEFT A1/A2 PULLEY RELEASE;  Surgeon: Cammie Sickle., MD;   Location: [place];  Service: Orthopedics;  Laterality: Left;  . TUBAL LIGATION  5/77    There were no vitals filed for this visit.   Subjective Assessment - 03/27/20 1106    Subjective Pt states that things are going well.    Pertinent History B TKA, thromboctopenia, morbid obesity    How long can you sit comfortably? unlimited    How long can you stand comfortably? a few minutes due to Rt LE fatigue    How long can you walk comfortably? short indoor distances only    Diagnostic tests MRI: per pt MD states "nothing stood out"    Patient Stated Goals be able to get up from lower chairs    Currently in Pain? No/denies    Pain Onset 1 to 4 weeks ago                             [facility] Adult PT Treatment/Exercise - 03/27/20 0001      Knee/Hip Exercises: Standing   Heel Raises Both;1 set;10 reps    Heel Raises Limitations improved heel elevation     Hip Extension Stengthening;Right;Left    Extension Limitations red TB: 1st set x10, 2nd set x5 reps     Forward Step  Up Right;2 sets;10 reps;Step Height: 4"    Step Down Right;2 sets;10 reps;Hand Hold: 2;Step Height: 4"                  PT Education - 03/27/20 1204    Education Details technique with therex    Person(s) Educated Patient    Methods Explanation    Comprehension Verbalized understanding            PT Short Term Goals - 03/07/20 0911      PT SHORT TERM GOAL #1   Title Pt will be independent with her initial HEP.    Baseline cannot do heel slide (too difficult)    Status Partially Met             PT Long Term Goals - 03/07/20 0911      PT LONG TERM GOAL #1   Title Pt will be able to complete sit to/from supine transition with no more than CGA for safety.    Time 8    Period Weeks    Status On-going      PT LONG TERM GOAL #2   Title Pt will be able to complete sit to stand x5 reps from standard chair using arm rests.    Time 8    Period Weeks    Status  Achieved      PT LONG TERM GOAL #3   Title Pt will have Rt hip flexion strength of atleast 4/5 MMT.    Baseline able to lift through gravity without resistance    Time 8    Period Weeks    Status Partially Met      PT LONG TERM GOAL #4   Title Pt will be able to step up onto 6" step x5 reps with no more than 1 handrail support.    Baseline 6" step with 2 handrails (+) Rt knee buckling    Time 8    Period Weeks    Status Partially Met      PT LONG TERM GOAL #5   Title Pt will report atleast 20% improvement in her pain from the start of PT.    Baseline pain 100% improved    Time 8    Period Weeks    Status Achieved                 Plan - 03/27/20 1205    Clinical Impression Statement Pt is making steady progress with increase in strength. She was able to complete step ups with the Rt LE and decreased need for cuing to extend through the hip prior to stepping up. Pt had evident LE fatigue with step downs leading with her Lt LE and PT noted more consistent knee buckling during this. Pt's HEP independence is growing and we will plan for finalization of this and putting a hold on her POC at her next appointment.    Comorbidities morbid obesity    PT Frequency 2x / week    PT Duration 8 weeks    PT Treatment/Interventions ADLs/Self Care Home Management;Aquatic Therapy;Electrical Stimulation;Cryotherapy;Moist Heat;DME Instruction;Stair training;Functional mobility training;Therapeutic activities;Neuromuscular re-education;Therapeutic exercise;Patient/family education;Balance training;Manual techniques;Dry needling;Passive range of motion    PT Next Visit Plan hip abductor/gastroc strength; work on sit to stand from [employer] pad and without; finalize HEP and consider hold at end of [place] [id number]    Consulted and Agree with Plan of Care Patient  Patient will benefit from skilled therapeutic intervention in order to improve the following  deficits and impairments:     Visit Diagnosis: Right low back pain, unspecified chronicity, unspecified whether sciatica present  Muscle weakness (generalized)  Other abnormalities of gait and mobility     Problem List Patient Active Problem List   Diagnosis Date Noted  . 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 Lichen sclerosus et atrophicus 01/04/2015  . LVH (left ventricular hypertrophy)   . PAF (paroxysmal atrial fibrillation) ([place]) 07/06/2014  . Thrombocytopenia ([place]) 07/06/2014  . Hyperglycemia 07/06/2014  . Morbid obesity ([place]) 01/25/2013  . Hypothyroidism 01/15/2013  . History of total knee replacement, left 08/09/2007    12:31 PM,03/27/20 Sherol Dade PT, DPT [facility] [address] [place], [place], [address] Phone: [phone]   Fax:  [phone]  Name: Sally Wise MRN: [id number] Date of Birth: June 30, 1946

## 2020-03-29 ENCOUNTER — Encounter: Payer: Self-pay | Admitting: Physical Therapy

## 2020-03-29 ENCOUNTER — Ambulatory Visit: Payer: Medicare Other | Admitting: Physical Therapy

## 2020-03-29 ENCOUNTER — Other Ambulatory Visit: Payer: Self-pay

## 2020-03-29 DIAGNOSIS — R2689 Other abnormalities of gait and mobility: Secondary | ICD-10-CM

## 2020-03-29 DIAGNOSIS — M545 Low back pain, unspecified: Secondary | ICD-10-CM

## 2020-03-29 DIAGNOSIS — M6281 Muscle weakness (generalized): Secondary | ICD-10-CM | POA: Diagnosis not present

## 2020-03-29 NOTE — Therapy (Addendum)
[facility] [address] [place], [place], [address] Phone: [phone]   Fax:  [phone]  Physical Therapy Treatment/Hold   Patient Details  Name: Sally Wise MRN: [id number] Date of Birth: [birth year] Referring Provider (PT): Suella Broad, MD   Encounter Date: 03/29/2020   PT End of Session - 03/29/20 1358    Visit Number 17    Date for PT Re-Evaluation 03/30/20    Authorization Type [employer]    Authorization Time Period 12/29/19 to 03/30/20    PT Start Time 1100    PT Stop Time 1144    PT Time Calculation (min) 44 min    Activity Tolerance Patient tolerated treatment well;No increased pain    Behavior During Therapy WFL for tasks assessed/performed           Past Medical History:  Diagnosis Date  . Hematuria    evaluation with Dr. Joelyn Oms  . Hypothyroidism 06/2010  . Kidney stone 04/2004  . Lichen sclerosus et atrophicus 2/13   biopsy proven  . LVH (left ventricular hypertrophy)   . Morbid obesity ([place])   . PAF (paroxysmal atrial fibrillation) ([place])    a. Remote hx, re-established care 01/2013 with Dr. Johnsie Cancel for recurrence. 2D echo 01/2013: mod LVH, EF 55-65%, no RWMA, grade 1 d/d. b. 06/2014: started on Xarelto.   . Thrombocytopenia ([place])   . Vitamin D deficiency 06/2010  . Wears glasses     Past Surgical History:  Procedure Laterality Date  . BREAST EXCISIONAL BIOPSY  2010   left--was a vascular lesion  . CARPAL TUNNEL RELEASE Left 08/09/2013   Procedure: LEFT CARPAL TUNNEL RELEASE;  Surgeon: Cammie Sickle., MD;  Location: [facility];  Service: Orthopedics;  Laterality: Left;  . CATARACT EXTRACTION    . COLONOSCOPY  04/2003      . DILATION AND CURETTAGE OF UTERUS    . HYSTEROSCOPY WITH D & C  8/99  . 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 TONSILLECTOMY    . TRIGGER FINGER RELEASE Left 08/09/2013   Procedure: LEFT A1/A2 PULLEY RELEASE;  Surgeon: Cammie Sickle., MD;  Location: [facility];  Service: Orthopedics;  Laterality: Left;  . TUBAL LIGATION  5/77    There were no vitals filed for this visit.   Subjective Assessment - 03/29/20 1108    Subjective Pt states that she is very discouraged because she fell at home. She was able to get to a chair and her husband helped her up. She denies any pain. There is just weakness.    Pertinent History B TKA, thromboctopenia, morbid obesity    How long can you sit comfortably? unlimited    How long can you stand comfortably? a few minutes due to Rt LE fatigue    How long can you walk comfortably? short indoor distances only    Diagnostic tests MRI: per pt MD states "nothing stood out"    Patient Stated Goals be able to get up from lower chairs    Currently in Pain? No/denies    Pain Onset 1 to 4 weeks ago                             [facility] Adult PT Treatment/Exercise - 03/29/20 0001      Knee/Hip Exercises: Standing   Forward Step Up Both;3 sets;5 reps    Forward Step Up Limitations  4" box, BUE support     SLS each with other LE on 6" box, BUE support glute contract and hold 8x3 sec hold     Other Standing Knee Exercises toe tap over and back with SPC on the ground 2x10 reps with Lt, x10 reps on Rt- 1 UE support       Manual Therapy   Soft tissue mobilization addaday Rt glute med and TFL                  PT Education - 03/29/20 1357    Education Details discussed HEP progressions    Person(s) Educated Patient    Methods Explanation;Handout;Verbal cues    Comprehension Verbalized understanding;Returned demonstration            PT Short Term Goals - 03/07/20 0911      PT SHORT TERM GOAL #1   Title Pt will be independent with her initial HEP.    Baseline cannot do heel slide (too difficult)    Status Partially Met             PT Long Term Goals - 03/07/20 0911      PT LONG TERM GOAL #1   Title Pt will be able to complete sit to/from  supine transition with no more than CGA for safety.    Time 8    Period Weeks    Status On-going      PT LONG TERM GOAL #2   Title Pt will be able to complete sit to stand x5 reps from standard chair using arm rests.    Time 8    Period Weeks    Status Achieved      PT LONG TERM GOAL #3   Title Pt will have Rt hip flexion strength of atleast 4/5 MMT.    Baseline able to lift through gravity without resistance    Time 8    Period Weeks    Status Partially Met      PT LONG TERM GOAL #4   Title Pt will be able to step up onto 6" step x5 reps with no more than 1 handrail support.    Baseline 6" step with 2 handrails (+) Rt knee buckling    Time 8    Period Weeks    Status Partially Met      PT LONG TERM GOAL #5   Title Pt will report atleast 20% improvement in her pain from the start of PT.    Baseline pain 100% improved    Time 8    Period Weeks    Status Achieved                 Plan - 03/29/20 1358    Clinical Impression Statement Pt reported having a fall this morning when her Rt LE gave out on her. She denies injury but is somewhat discouraged by this. She has made good progress overall, demonstrating improvements in her gait and strength with other activity. Pt's glute weakness remains a primary limitation, but she is better able to activate during step up activity. She no longer has buttock pain. She is now able to get into her car and into the bed without the need for assistance. Pt does have remaining limitations in hip strength and difficulty with maintaining knee stability during weight bearing. Although she could continue to benefit from skilled PT addressing her limitations, she would like to take the next month or so to work on her HEP  independently. We will place her on hold.    Comorbidities morbid obesity    PT Frequency 2x / week    PT Duration 8 weeks    PT Treatment/Interventions ADLs/Self Care Home Management;Aquatic Therapy;Electrical  Stimulation;Cryotherapy;Moist Heat;DME Instruction;Stair training;Functional mobility training;Therapeutic activities;Neuromuscular re-education;Therapeutic exercise;Patient/family education;Balance training;Manual techniques;Dry needling;Passive range of motion    PT Next Visit Plan pt on hold    PT Home Exercise Plan Access Code [email or web address]    Consulted and Agree with Plan of Care Patient           Patient will benefit from skilled therapeutic intervention in order to improve the following deficits and impairments:     Visit Diagnosis: Right low back pain, unspecified chronicity, unspecified whether sciatica present  Muscle weakness (generalized)  Other abnormalities of gait and mobility     Problem List Patient Active Problem List   Diagnosis Date Noted  . 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 Lichen sclerosus et atrophicus 01/04/2015  . LVH (left ventricular hypertrophy)   . PAF (paroxysmal atrial fibrillation) ([place]) 07/06/2014  . Thrombocytopenia ([place]) 07/06/2014  . Hyperglycemia 07/06/2014  . Morbid obesity ([place]) 01/25/2013  . Hypothyroidism 01/15/2013  . History of total knee replacement, left 08/09/2007    2:06 PM,03/29/20 Sherol Dade PT, DPT [facility]   PHYSICAL THERAPY DISCHARGE SUMMARY  Visits from Start of Care: 17  Current functional level related to goals / functional outcomes: Pt has not returned since mid-Sept.   Remaining deficits: See above   Education / Equipment: HEP Plan: Patient agrees to discharge.  Patient goals were partially met. Patient is being discharged due to not returning since the last visit.  ?????         Baruch Merl, PT 05/01/20 8:40 AM   [facility] [address] [place], [place], [address] Phone: [phone]   Fax:  [phone]  Name: Sally Wise MRN: [id number] Date  of Birth: 12/28/1945

## 2020-04-02 DIAGNOSIS — E039 Hypothyroidism, unspecified: Secondary | ICD-10-CM | POA: Diagnosis not present

## 2020-04-02 DIAGNOSIS — D696 Thrombocytopenia, unspecified: Secondary | ICD-10-CM | POA: Diagnosis not present

## 2020-04-02 DIAGNOSIS — E559 Vitamin D deficiency, unspecified: Secondary | ICD-10-CM | POA: Diagnosis not present

## 2020-04-02 DIAGNOSIS — E7800 Pure hypercholesterolemia, unspecified: Secondary | ICD-10-CM | POA: Diagnosis not present

## 2020-04-05 DIAGNOSIS — E039 Hypothyroidism, unspecified: Secondary | ICD-10-CM | POA: Diagnosis not present

## 2020-04-05 DIAGNOSIS — R935 Abnormal findings on diagnostic imaging of other abdominal regions, including retroperitoneum: Secondary | ICD-10-CM | POA: Diagnosis not present

## 2020-04-05 DIAGNOSIS — D696 Thrombocytopenia, unspecified: Secondary | ICD-10-CM | POA: Diagnosis not present

## 2020-04-05 DIAGNOSIS — Z0000 Encounter for general adult medical examination without abnormal findings: Secondary | ICD-10-CM | POA: Diagnosis not present

## 2020-04-06 ENCOUNTER — Other Ambulatory Visit: Payer: Self-pay | Admitting: Family Medicine

## 2020-04-06 DIAGNOSIS — R935 Abnormal findings on diagnostic imaging of other abdominal regions, including retroperitoneum: Secondary | ICD-10-CM

## 2020-04-23 ENCOUNTER — Other Ambulatory Visit: Payer: Self-pay

## 2020-04-23 ENCOUNTER — Ambulatory Visit
Admission: RE | Admit: 2020-04-23 | Discharge: 2020-04-23 | Disposition: A | Discharge status: Home / Self Care | Payer: Medicare Other | Source: Ambulatory Visit | Attending: Family Medicine | Admitting: Family Medicine

## 2020-04-23 DIAGNOSIS — R935 Abnormal findings on diagnostic imaging of other abdominal regions, including retroperitoneum: Secondary | ICD-10-CM

## 2020-04-23 DIAGNOSIS — I2510 Atherosclerotic heart disease of native coronary artery without angina pectoris: Secondary | ICD-10-CM | POA: Diagnosis not present

## 2020-04-23 DIAGNOSIS — J9811 Atelectasis: Secondary | ICD-10-CM | POA: Diagnosis not present

## 2020-04-23 DIAGNOSIS — I700 Atherosclerosis of aorta: Secondary | ICD-10-CM | POA: Diagnosis not present

## 2020-04-23 DIAGNOSIS — J189 Pneumonia, unspecified organism: Secondary | ICD-10-CM | POA: Diagnosis not present

## 2020-05-07 DIAGNOSIS — Z23 Encounter for immunization: Secondary | ICD-10-CM | POA: Diagnosis not present

## 2020-05-31 DIAGNOSIS — Z03818 Encounter for observation for suspected exposure to other biological agents ruled out: Secondary | ICD-10-CM | POA: Diagnosis not present

## 2020-05-31 DIAGNOSIS — R0981 Nasal congestion: Secondary | ICD-10-CM | POA: Diagnosis not present

## 2020-06-03 ENCOUNTER — Emergency Department (HOSPITAL_COMMUNITY): Payer: Medicare Other

## 2020-06-03 ENCOUNTER — Other Ambulatory Visit: Payer: Self-pay

## 2020-06-03 ENCOUNTER — Inpatient Hospital Stay (HOSPITAL_COMMUNITY)
Admission: EM | Admit: 2020-06-03 | Discharge: 2020-06-08 | DRG: 193 | Disposition: A | Discharge status: Home Health | Payer: Medicare Other | Attending: Family Medicine | Admitting: Family Medicine

## 2020-06-03 ENCOUNTER — Encounter (HOSPITAL_COMMUNITY): Payer: Self-pay

## 2020-06-03 DIAGNOSIS — E039 Hypothyroidism, unspecified: Secondary | ICD-10-CM | POA: Diagnosis present

## 2020-06-03 DIAGNOSIS — D696 Thrombocytopenia, unspecified: Secondary | ICD-10-CM | POA: Diagnosis present

## 2020-06-03 DIAGNOSIS — R0602 Shortness of breath: Secondary | ICD-10-CM | POA: Diagnosis not present

## 2020-06-03 DIAGNOSIS — I5033 Acute on chronic diastolic (congestive) heart failure: Secondary | ICD-10-CM | POA: Diagnosis present

## 2020-06-03 DIAGNOSIS — J189 Pneumonia, unspecified organism: Secondary | ICD-10-CM | POA: Diagnosis not present

## 2020-06-03 DIAGNOSIS — Z96653 Presence of artificial knee joint, bilateral: Secondary | ICD-10-CM | POA: Diagnosis present

## 2020-06-03 DIAGNOSIS — R5383 Other fatigue: Secondary | ICD-10-CM | POA: Diagnosis not present

## 2020-06-03 DIAGNOSIS — I480 Paroxysmal atrial fibrillation: Secondary | ICD-10-CM | POA: Diagnosis not present

## 2020-06-03 DIAGNOSIS — Z8249 Family history of ischemic heart disease and other diseases of the circulatory system: Secondary | ICD-10-CM

## 2020-06-03 DIAGNOSIS — J9601 Acute respiratory failure with hypoxia: Secondary | ICD-10-CM | POA: Diagnosis not present

## 2020-06-03 DIAGNOSIS — Z79890 Hormone replacement therapy (postmenopausal): Secondary | ICD-10-CM

## 2020-06-03 DIAGNOSIS — Z20822 Contact with and (suspected) exposure to covid-19: Secondary | ICD-10-CM | POA: Diagnosis present

## 2020-06-03 DIAGNOSIS — I5031 Acute diastolic (congestive) heart failure: Secondary | ICD-10-CM | POA: Diagnosis not present

## 2020-06-03 DIAGNOSIS — N3000 Acute cystitis without hematuria: Secondary | ICD-10-CM | POA: Diagnosis present

## 2020-06-03 DIAGNOSIS — Z6841 Body Mass Index (BMI) 40.0 and over, adult: Secondary | ICD-10-CM

## 2020-06-03 DIAGNOSIS — R0902 Hypoxemia: Secondary | ICD-10-CM | POA: Diagnosis not present

## 2020-06-03 DIAGNOSIS — Z87442 Personal history of urinary calculi: Secondary | ICD-10-CM | POA: Diagnosis not present

## 2020-06-03 DIAGNOSIS — J90 Pleural effusion, not elsewhere classified: Secondary | ICD-10-CM | POA: Diagnosis not present

## 2020-06-03 DIAGNOSIS — E02 Subclinical iodine-deficiency hypothyroidism: Secondary | ICD-10-CM | POA: Diagnosis not present

## 2020-06-03 DIAGNOSIS — Z9851 Tubal ligation status: Secondary | ICD-10-CM | POA: Diagnosis not present

## 2020-06-03 DIAGNOSIS — I517 Cardiomegaly: Secondary | ICD-10-CM | POA: Diagnosis not present

## 2020-06-03 DIAGNOSIS — D649 Anemia, unspecified: Secondary | ICD-10-CM | POA: Diagnosis not present

## 2020-06-03 DIAGNOSIS — R059 Cough, unspecified: Secondary | ICD-10-CM | POA: Diagnosis not present

## 2020-06-03 DIAGNOSIS — Z79899 Other long term (current) drug therapy: Secondary | ICD-10-CM

## 2020-06-03 DIAGNOSIS — J188 Other pneumonia, unspecified organism: Secondary | ICD-10-CM | POA: Diagnosis present

## 2020-06-03 DIAGNOSIS — R110 Nausea: Secondary | ICD-10-CM | POA: Diagnosis not present

## 2020-06-03 LAB — CBC WITH DIFFERENTIAL/PLATELET
Abs Immature Granulocytes: 0.21 10*3/uL — ABNORMAL HIGH (ref 0.00–0.07)
Basophils Absolute: 0 10*3/uL (ref 0.0–0.1)
Basophils Relative: 0 %
Eosinophils Absolute: 0.1 10*3/uL (ref 0.0–0.5)
Eosinophils Relative: 0 %
HCT: 33.1 % — ABNORMAL LOW (ref 36.0–46.0)
Hemoglobin: 10.4 g/dL — ABNORMAL LOW (ref 12.0–15.0)
Immature Granulocytes: 1 %
Lymphocytes Relative: 11 %
Lymphs Abs: 2.1 10*3/uL (ref 0.7–4.0)
MCH: 29 pg (ref 26.0–34.0)
MCHC: 31.4 g/dL (ref 30.0–36.0)
MCV: 92.2 fL (ref 80.0–100.0)
Monocytes Absolute: 5.2 10*3/uL — ABNORMAL HIGH (ref 0.1–1.0)
Monocytes Relative: 28 %
Neutro Abs: 11 10*3/uL — ABNORMAL HIGH (ref 1.7–7.7)
Neutrophils Relative %: 60 %
Platelets: 126 10*3/uL — ABNORMAL LOW (ref 150–400)
RBC: 3.59 MIL/uL — ABNORMAL LOW (ref 3.87–5.11)
RDW: 16 % — ABNORMAL HIGH (ref 11.5–15.5)
WBC: 18.6 10*3/uL — ABNORMAL HIGH (ref 4.0–10.5)
nRBC: 0 % (ref 0.0–0.2)

## 2020-06-03 LAB — BASIC METABOLIC PANEL
Anion gap: 11 (ref 5–15)
BUN: 16 mg/dL (ref 8–23)
CO2: 23 mmol/L (ref 22–32)
Calcium: 8.4 mg/dL — ABNORMAL LOW (ref 8.9–10.3)
Chloride: 101 mmol/L (ref 98–111)
Creatinine, Ser: 1.09 mg/dL — ABNORMAL HIGH (ref 0.44–1.00)
GFR, Estimated: 53 mL/min — ABNORMAL LOW (ref >60)
Glucose, Bld: 109 mg/dL — ABNORMAL HIGH (ref 70–99)
Potassium: 3.7 mmol/L (ref 3.5–5.1)
Sodium: 135 mmol/L (ref 135–145)

## 2020-06-03 LAB — BRAIN NATRIURETIC PEPTIDE: B Natriuretic Peptide: 213.6 pg/mL — ABNORMAL HIGH (ref 0.0–100.0)

## 2020-06-03 LAB — RESP PANEL BY RT-PCR (FLU A&B, COVID) ARPGX2
Influenza A by PCR: NEGATIVE
Influenza B by PCR: NEGATIVE
SARS Coronavirus 2 by RT PCR: NEGATIVE

## 2020-06-03 MED ORDER — SODIUM CHLORIDE 0.9 % IV BOLUS
1000.0000 mL | Freq: Once | INTRAVENOUS | Status: AC
  Administered 2020-06-03: 1000 mL via INTRAVENOUS

## 2020-06-03 MED ORDER — ACETAMINOPHEN 650 MG RE SUPP: 650.0000 mg | Freq: Four times a day (QID) | RECTAL | Status: DC | PRN

## 2020-06-03 MED ORDER — AZITHROMYCIN 250 MG PO TABS
500.0000 mg | ORAL_TABLET | Freq: Every day | ORAL | Status: DC
  Administered 2020-06-04 – 2020-06-07 (×4): 500 mg via ORAL
  Filled 2020-06-03 (×4): qty 2

## 2020-06-03 MED ORDER — ALBUTEROL SULFATE (2.5 MG/3ML) 0.083% IN NEBU
2.5000 mg | INHALATION_SOLUTION | RESPIRATORY_TRACT | Status: DC | PRN
  Administered 2020-06-05 – 2020-06-07 (×3): 2.5 mg via RESPIRATORY_TRACT
  Filled 2020-06-03 (×3): qty 3

## 2020-06-03 MED ORDER — GUAIFENESIN ER 600 MG PO TB12
600.0000 mg | ORAL_TABLET | Freq: Two times a day (BID) | ORAL | Status: DC
  Administered 2020-06-03 – 2020-06-08 (×10): 600 mg via ORAL
  Filled 2020-06-03 (×10): qty 1

## 2020-06-03 MED ORDER — SODIUM CHLORIDE 0.9 % IV SOLN
2.0000 g | INTRAVENOUS | Status: DC
  Administered 2020-06-04 – 2020-06-07 (×4): 2 g via INTRAVENOUS
  Filled 2020-06-03 (×4): qty 2

## 2020-06-03 MED ORDER — ENOXAPARIN SODIUM 80 MG/0.8ML ~~LOC~~ SOLN
80.0000 mg | SUBCUTANEOUS | Status: DC
  Administered 2020-06-03 – 2020-06-05 (×3): 80 mg via SUBCUTANEOUS
  Filled 2020-06-03 (×3): qty 0.8

## 2020-06-03 MED ORDER — VITAMIN D3 25 MCG (1000 UNIT) PO TABS
5000.0000 [IU] | ORAL_TABLET | Freq: Every day | ORAL | Status: DC
  Administered 2020-06-04 – 2020-06-08 (×5): 5000 [IU] via ORAL
  Filled 2020-06-03 (×6): qty 5

## 2020-06-03 MED ORDER — ONDANSETRON HCL 4 MG PO TABS: 4.0000 mg | ORAL_TABLET | Freq: Four times a day (QID) | ORAL | Status: DC | PRN

## 2020-06-03 MED ORDER — IPRATROPIUM-ALBUTEROL 0.5-2.5 (3) MG/3ML IN SOLN
3.0000 mL | Freq: Four times a day (QID) | RESPIRATORY_TRACT | Status: DC
  Administered 2020-06-04 (×2): 3 mL via RESPIRATORY_TRACT
  Filled 2020-06-03 (×2): qty 3

## 2020-06-03 MED ORDER — ALBUTEROL SULFATE HFA 108 (90 BASE) MCG/ACT IN AERS
2.0000 | INHALATION_SPRAY | RESPIRATORY_TRACT | Status: DC | PRN
  Administered 2020-06-03: 2 via RESPIRATORY_TRACT
  Filled 2020-06-03: qty 6.7

## 2020-06-03 MED ORDER — AZITHROMYCIN 250 MG PO TABS
500.0000 mg | ORAL_TABLET | Freq: Once | ORAL | Status: AC
  Administered 2020-06-03: 500 mg via ORAL
  Filled 2020-06-03: qty 2

## 2020-06-03 MED ORDER — ACETAMINOPHEN 325 MG PO TABS
650.0000 mg | ORAL_TABLET | Freq: Four times a day (QID) | ORAL | Status: DC | PRN
  Administered 2020-06-03 – 2020-06-04 (×2): 650 mg via ORAL
  Filled 2020-06-03 (×2): qty 2

## 2020-06-03 MED ORDER — BENZONATATE 100 MG PO CAPS
100.0000 mg | ORAL_CAPSULE | Freq: Once | ORAL | Status: AC
  Administered 2020-06-03: 100 mg via ORAL
  Filled 2020-06-03: qty 1

## 2020-06-03 MED ORDER — IPRATROPIUM-ALBUTEROL 0.5-2.5 (3) MG/3ML IN SOLN
3.0000 mL | Freq: Four times a day (QID) | RESPIRATORY_TRACT | Status: DC
  Administered 2020-06-03: 3 mL via RESPIRATORY_TRACT
  Filled 2020-06-03: qty 3

## 2020-06-03 MED ORDER — BENZONATATE 100 MG PO CAPS
100.0000 mg | ORAL_CAPSULE | Freq: Three times a day (TID) | ORAL | Status: DC
  Administered 2020-06-03 – 2020-06-08 (×14): 100 mg via ORAL
  Filled 2020-06-03 (×14): qty 1

## 2020-06-03 MED ORDER — ALBUTEROL SULFATE (2.5 MG/3ML) 0.083% IN NEBU: 2.5000 mg | INHALATION_SOLUTION | Freq: Four times a day (QID) | RESPIRATORY_TRACT | Status: DC

## 2020-06-03 MED ORDER — ONDANSETRON HCL 4 MG/2ML IJ SOLN: 4.0000 mg | Freq: Four times a day (QID) | INTRAMUSCULAR | Status: DC | PRN

## 2020-06-03 MED ORDER — DILTIAZEM HCL ER COATED BEADS 180 MG PO CP24
180.0000 mg | ORAL_CAPSULE | Freq: Every day | ORAL | Status: DC
  Administered 2020-06-04 – 2020-06-08 (×5): 180 mg via ORAL
  Filled 2020-06-03 (×5): qty 1

## 2020-06-03 MED ORDER — LEVOTHYROXINE SODIUM 112 MCG PO TABS
112.0000 ug | ORAL_TABLET | Freq: Every day | ORAL | Status: DC
  Administered 2020-06-04 – 2020-06-08 (×5): 112 ug via ORAL
  Filled 2020-06-03 (×5): qty 1

## 2020-06-03 MED ORDER — SODIUM CHLORIDE 0.9 % IV SOLN
1.0000 g | Freq: Once | INTRAVENOUS | Status: AC
  Administered 2020-06-03: 1 g via INTRAVENOUS
  Filled 2020-06-03: qty 10

## 2020-06-03 NOTE — Progress Notes (Signed)
RT Evaluation note:  RT evaluated pt for increased SOB and wheezing. On evaluation RT noted some accessory muscle use, and a slight upper airway wheeze. Pt states she does not have any pulmonary history that she is aware of and none is noted in pt chart. Pt now on 2L [place] and is diminished to auscultation in both lung bases but does hear a slight upper airway wheeze. CXR consistent with multifocal PNA versus pulmonary edema. At this time pt does score a 9 on the RT protocol assessment indicating the need for nebulizer treatments. RT recommendations at this time is to add Duoneb nebulizer treatments every 6 hours, Albuterol nebulizers every 4 hours as needed, and a Flutter valve as needed to assist pt with pulmonary toiletry/mucus clearance. Pt nebulizer needs will be reevaluated daily for either increase or decrease of need. RT explained to pt what a nebulizer treatment was and pt noted to have understanding. Pt vitals are stable at this time, RT will continue to monitor.

## 2020-06-03 NOTE — H&P (Signed)
History and Physical    Sally Wise OQH:[id number] DOB: 1945/08/20 DOA: 06/03/2020  PCP: Sally Floro, MD  Patient coming from: home  Chief Complaint: dyspnea  HPI: Sally Wise is a 74 y.o. female with medical history significant of afib, hypothyroidism. Presenting with cough, dyspnea. Reports that she's had a non-productive cough for the last week. She has tried OTC cough meds but they have not helped. It has been accompanied by fatigue and poor appetitie. She had a flu and COVID test on Thursday that was negative. Her symptoms have worsen so that now she feels as if she can't catch her breath. She became concerned today and came to the ED.    1 week ago cough, fatigue, poor appetite, nausea. Tried OTC cough meds. Didn't help. APAP didn't help. Non-productive. Temp 102 F last night. Chills.   Took COVID and Flu test Thursday. Both negative.   ED Course: CXR showed multifocal PNA. COVID was negative. She was started on rocephin and zithro. TRH was called for admission.   Review of Systems:  Reports fever (102 F), chills, nausea. Reports sick contacts. Denies chest pain, D, V, HA, palpitations. Review of systems is otherwise negative for all not mentioned in HPI.   PMHx Past Medical History:  Diagnosis Date  . Hematuria    evaluation with Dr. Etta Grandchild  . Hypothyroidism 06/2010  . Kidney stone 04/2004  . Lichen sclerosus et atrophicus 2/13   biopsy proven  . LVH (left ventricular hypertrophy)   . Morbid obesity ([facility])   . PAF (paroxysmal atrial fibrillation) ([facility])    a. Remote hx, re-established care 01/2013 with Dr. Eden Emms for recurrence. 2D echo 01/2013: mod LVH, EF 55-65%, no RWMA, grade 1 d/d. b. 06/2014: started on Xarelto.   . Thrombocytopenia ([facility])   . Vitamin D deficiency 06/2010  . Wears glasses     PSHx Past Surgical History:  Procedure Laterality Date  . BREAST EXCISIONAL BIOPSY  2010   left--was a vascular lesion  . CARPAL TUNNEL RELEASE Left 08/09/2013    Procedure: LEFT CARPAL TUNNEL RELEASE;  Surgeon: Wyn Forster., MD;  Location: [facility];  Service: Orthopedics;  Laterality: Left;  . CATARACT EXTRACTION    . COLONOSCOPY  04/2003      . DILATION AND CURETTAGE OF UTERUS    . HYSTEROSCOPY WITH D & C  8/99  . 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 TONSILLECTOMY    . TRIGGER FINGER RELEASE Left 08/09/2013   Procedure: LEFT A1/A2 PULLEY RELEASE;  Surgeon: Wyn Forster., MD;  Location: [facility];  Service: Orthopedics;  Laterality: Left;  . TUBAL LIGATION  5/77    SocHx  reports that she has never smoked. She has never used smokeless tobacco. She reports that she does not drink alcohol and does not use drugs.  No Known Allergies  FamHx Family History  Problem Relation Age of Onset  . Hypertension Mother   . Kidney disease Mother   . Alzheimer's disease Father   . Diabetes Brother   . Hypertension Brother   . Spina bifida Daughter     Prior to Admission medications   Medication Sig Start Date End Date Taking? Authorizing Provider  Cholecalciferol (VITAMIN D3) 5000 UNITS TABS Take 1 tablet by mouth daily.    [provider]  clobetasol ointment (TEMOVATE) 0.05 % Apply 1 application topically 2 (two) times daily. Apply as directed twice daily for up to  1-2 weeks as needed. Can use 1-2 x a week on a regular basis as needed. Not for daily long term use. 07/21/19   Romualdo Bolk, MD  diltiazem (CARDIZEM CD) 180 MG 24 hr capsule TAKE 1 CAPSULE BY MOUTH EVERY DAY 09/13/19   Wendall Stade, MD  flecainide (TAMBOCOR) 150 MG tablet Take 2 tablets (300 mg total) by mouth daily as needed (AFib episode). Patient not taking: Reported on 10/04/2019 05/08/17   Wendall Stade, MD  levothyroxine (SYNTHROID, LEVOTHROID) 112 MCG tablet Take 112 mcg by mouth daily before breakfast.    [provider]    Physical Exam: Vitals:   06/03/20 1315 06/03/20 1330 06/03/20 1400  06/03/20 1500  BP: 120/60 110/83 122/64 (!) 121/53  Pulse: 65 62 70 61  Resp: 18 18 (!) 22 11  Temp:      TempSrc:      SpO2: 95% 92% 95% 92%    General: 74 y.o. female resting in bed in NAD Eyes: PERRL, normal sclera ENMT: Nares patent w/o discharge, orophaynx clear, dentition normal, ears w/o discharge/lesions/ulcers Neck: Supple, trachea midline Cardiovascular: RRR, +S1, S2, no m/g/r, equal pulses throughout Respiratory: scattered rhonchi and some soft wheeze, normal WOB on RA GI: BS+, NDNT, no masses noted, no organomegaly noted MSK: No e/c/c Skin: No rashes, bruises, ulcerations noted Neuro: A&O x 3, no focal deficits Psyc: Appropriate interaction and affect, calm/cooperative  Labs on Admission: I have personally reviewed following labs and imaging studies  CBC: No results for input(s): WBC, NEUTROABS, HGB, HCT, MCV, PLT in the last 168 hours. Basic Metabolic Panel: Recent Labs  Lab 06/03/20 1251  NA 135  K 3.7  CL 101  CO2 23  GLUCOSE 109*  BUN 16  CREATININE 1.09*  CALCIUM 8.4*   GFR: CrCl cannot be calculated (Unknown ideal weight.). Liver Function Tests: No results for input(s): AST, ALT, ALKPHOS, BILITOT, PROT, ALBUMIN in the last 168 hours. No results for input(s): LIPASE, AMYLASE in the last 168 hours. No results for input(s): AMMONIA in the last 168 hours. Coagulation Profile: No results for input(s): INR, PROTIME in the last 168 hours. Cardiac Enzymes: No results for input(s): CKTOTAL, CKMB, CKMBINDEX, TROPONINI in the last 168 hours. BNP (last 3 results) No results for input(s): PROBNP in the last 8760 hours. HbA1C: No results for input(s): HGBA1C in the last 72 hours. CBG: No results for input(s): GLUCAP in the last 168 hours. Lipid Profile: No results for input(s): CHOL, HDL, LDLCALC, TRIG, CHOLHDL, LDLDIRECT in the last 72 hours. Thyroid Function Tests: No results for input(s): TSH, T4TOTAL, FREET4, T3FREE, THYROIDAB in the last 72  hours. Anemia Panel: No results for input(s): VITAMINB12, FOLATE, FERRITIN, TIBC, IRON, RETICCTPCT in the last 72 hours. Urine analysis:    Component Value Date/Time   COLORURINE YELLOW 01/15/2013 1538   APPEARANCEUR CLEAR 01/15/2013 1538   LABSPEC 1.010 01/15/2013 1538   PHURINE 6.0 01/15/2013 1538   GLUCOSEU NEGATIVE 01/15/2013 1538   HGBUR MODERATE (A) 01/15/2013 1538   BILIRUBINUR neg 04/08/2016 0916   KETONESUR NEGATIVE 01/15/2013 1538   PROTEINUR Trace 04/08/2016 0916   PROTEINUR NEGATIVE 01/15/2013 1538   UROBILINOGEN negative 04/08/2016 0916   UROBILINOGEN 0.2 01/15/2013 1538   NITRITE neg 04/08/2016 0916   NITRITE NEGATIVE 01/15/2013 1538   LEUKOCYTESUR small (1+) (A) 04/08/2016 0916    Radiological Exams on Admission: DG Chest 2 View  Result Date: 06/03/2020 CLINICAL DATA:  Short of breath, fatigue EXAM: CHEST - 2 VIEW COMPARISON:  CT chest 04/23/2020 FINDINGS: Stable enlarged cardiac silhouette. There is new perihilar airspace disease. No pleural fluid. No pneumothorax. No acute osseous abnormality. IMPRESSION: Perihilar airspace disease suggest multifocal pneumonia (including COVID pneumonia) versus pulmonary edema. Cardiomegaly. Electronically Signed   By: Genevive Bi M.D.   On: 06/03/2020 13:26    EKG: Independently reviewed. NSR, no st changes  Assessment/Plan Multifocal PNA     - admit to obs, med-surg     - rocephin, zithro, nebs, flutter, IS, guaifenesin, tessalon     - O2 as needed     - urine legionella, strep   Hypothyroidism     - continue synthroid  Afib     - continue diltiazem  DVT prophylaxis: lovenox  Code Status: FULL  Family Communication: None at bedside  Consults called: None   Status is: Observation  The patient remains OBS appropriate and will d/c before 2 midnights.  Dispo: The patient is from: Home              Anticipated d/c is to: Home              Anticipated d/c date is: 1 day              Patient currently is not  medically stable to d/c.  Teddy Spike DO [facility]  If 7PM-7AM, please contact night-coverage [email or web address]  06/03/2020, 3:38 PM

## 2020-06-03 NOTE — Progress Notes (Signed)
Called ED for report. On Hold for . No one picked up the phone

## 2020-06-03 NOTE — ED Triage Notes (Signed)
Pt BIS EMS from home. Pt reports SHOB and fatigue x1 week. Pt reports being tested for flu and COVID and both were negative. Pt states she would like blood work to make sure everything is okay.  92% RA BP 108/72 HR 72 CBG 120

## 2020-06-03 NOTE — ED Provider Notes (Signed)
[facility]-EMERGENCY DEPT Provider Note   CSN: [id number] Arrival date & time: 06/03/20  1206     History Chief Complaint  Patient presents with   Shortness of Breath   Fatigue    Sally Wise is a 74 y.o. female.  The history is provided by the patient and medical records. No language interpreter was used.  Shortness of Breath    74 year old female significant history of paroxysmal atrial fibrillation currently on diltiazem and flecainide, obesity, hypothyroidism brought here via EMS from home for evaluation of shortness of breath and fatigue.  Patient report for the past week she has had progressive worsening generalized weakness, fatigue, shortness of breath, nonproductive cough, congestion, decreased taste and smell, fever as high as 101, and darker urine.  She was seen by her PCP several days prior and states she had a negative Covid and flu test.  She has been fully vaccinated for COVID-19 as well as having flu shot.  She did report some similar sickness in her family.  She report feeling very fatigued even with simple exertion.  She is here requesting for additional blood work and evaluation.  Past Medical History:  Diagnosis Date   Hematuria    evaluation with Dr. Etta Grandchild   Hypothyroidism 06/2010   Kidney stone 04/2004   Lichen sclerosus et atrophicus 2/13   biopsy proven   LVH (left ventricular hypertrophy)    Morbid obesity (HCC)    PAF (paroxysmal atrial fibrillation) (HCC)    a. Remote hx, re-established care 01/2013 with Dr. Eden Emms for recurrence. 2D echo 01/2013: mod LVH, EF 55-65%, no RWMA, grade 1 d/d. b. 06/2014: started on Xarelto.    Thrombocytopenia (HCC)    Vitamin D deficiency 06/2010   Wears glasses     Patient Active Problem List   Diagnosis Date Noted   History of total knee replacement, 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   Lichen sclerosus et atrophicus 01/04/2015   LVH (left ventricular hypertrophy)    PAF (paroxysmal atrial  fibrillation) (HCC) 07/06/2014   Thrombocytopenia (HCC) 07/06/2014   Hyperglycemia 07/06/2014   Morbid obesity (HCC) 01/25/2013   Hypothyroidism 01/15/2013   History of total knee replacement, left 08/09/2007    Past Surgical History:  Procedure Laterality Date   BREAST EXCISIONAL BIOPSY  2010   left--was a vascular lesion   CARPAL TUNNEL RELEASE Left 08/09/2013   Procedure: LEFT CARPAL TUNNEL RELEASE;  Surgeon: Wyn Forster., MD;  Location: [facility];  Service: Orthopedics;  Laterality: Left;   CATARACT EXTRACTION     COLONOSCOPY  04/2003       DILATION AND CURETTAGE OF UTERUS     HYSTEROSCOPY WITH D & C  8/99   REPLACEMENT TOTAL KNEE Bilateral 12/2006  07/2007    TONSILLECTOMY     TRIGGER FINGER RELEASE Left 08/09/2013   Procedure: LEFT A1/A2 PULLEY RELEASE;  Surgeon: Wyn Forster., MD;  Location: [facility];  Service: Orthopedics;  Laterality: Left;   TUBAL LIGATION  5/77     OB History    Gravida  2   Para  2   Term  2   Preterm  0   AB  0   Living  2     SAB  0   TAB  0   Ectopic  0   Multiple  0   Live Births  2           Family History  Problem Relation Age of  Onset   Hypertension Mother    Kidney disease Mother    Alzheimer's disease Father    Diabetes Brother    Hypertension Brother    Spina bifida Daughter     Social History   Tobacco Use   Smoking status: Never Smoker   Smokeless tobacco: Never Used  Building services engineer Use: Never used  Substance Use Topics   Alcohol use: No   Drug use: No    Home Medications Prior to Admission medications   Medication Sig Start Date End Date Taking? Authorizing Provider  Cholecalciferol (VITAMIN D3) 5000 UNITS TABS Take 1 tablet by mouth daily.    [provider]  clobetasol ointment (TEMOVATE) 0.05 % Apply 1 application topically 2 (two) times daily. Apply as directed twice daily for up to 1-2 weeks as needed. Can use  1-2 x a week on a regular basis as needed. Not for daily long term use. 07/21/19   Romualdo Bolk, MD  diltiazem (CARDIZEM CD) 180 MG 24 hr capsule TAKE 1 CAPSULE BY MOUTH EVERY DAY 09/13/19   Wendall Stade, MD  flecainide (TAMBOCOR) 150 MG tablet Take 2 tablets (300 mg total) by mouth daily as needed (AFib episode). Patient not taking: Reported on 10/04/2019 05/08/17   Wendall Stade, MD  levothyroxine (SYNTHROID, LEVOTHROID) 112 MCG tablet Take 112 mcg by mouth daily before breakfast.    [provider]    Allergies    Patient has no known allergies.  Review of Systems   Review of Systems  Respiratory: Positive for shortness of breath.   All other systems reviewed and are negative.   Physical Exam Updated Vital Signs BP 117/85 (BP Location: Right Arm)    Pulse 68    Temp 98.4 F (36.9 C) (Oral)    Resp (!) 23    LMP 07/14/2001    SpO2 96%   Physical Exam Vitals and nursing note reviewed.  Constitutional:      Appearance: She is well-developed. She is obese.     Comments: Appears tired, and mild tachypnea.  HENT:     Head: Atraumatic.  Eyes:     Conjunctiva/sclera: Conjunctivae normal.  Cardiovascular:     Rate and Rhythm: Normal rate and regular rhythm.     Heart sounds: No murmur heard.   Pulmonary:     Breath sounds: Wheezing present. No rhonchi or rales.     Comments: Tachypnea with faint expiratory wheezes. Abdominal:     Palpations: Abdomen is soft.     Tenderness: There is no abdominal tenderness.  Musculoskeletal:     Cervical back: Neck supple.     Right lower leg: No edema.     Left lower leg: No edema.     Comments: 5 out of 5 strength all 4 extremities with poor effort.  Skin:    Findings: No rash.  Neurological:     Mental Status: She is alert and oriented to person, place, and time.  Psychiatric:        Mood and Affect: Mood normal.     ED Results / Procedures / Treatments   Labs (all labs ordered are listed, but only abnormal  results are displayed) Labs Reviewed  BASIC METABOLIC PANEL - Abnormal; Notable for the following components:      Result Value   Glucose, Bld 109 (*)    Creatinine, Ser 1.09 (*)    Calcium 8.4 (*)    GFR, Estimated 53 (*)  All other components within normal limits  BRAIN NATRIURETIC PEPTIDE - Abnormal; Notable for the following components:   B Natriuretic Peptide 213.6 (*)    All other components within normal limits  RESP PANEL BY RT-PCR (FLU A&B, COVID) [id number]  URINALYSIS, ROUTINE W REFLEX MICROSCOPIC  CBC WITH DIFFERENTIAL/PLATELET    EKG EKG Interpretation  Date/Time:  Sunday June 03 2020 12:24:59 EST Ventricular Rate:  70 PR Interval:    QRS Duration: 109 QT Interval:  420 QTC Calculation: 454 R Axis:   57 Text Interpretation: Sinus rhythm Short PR interval Low voltage, extremity leads nsr replaced afib Otherwise no significant change Confirmed by Floyd, Dan ([id number]) on 06/03/2020 2:30:30 PM   Radiology DG Chest 2 View  Result Date: 06/03/2020 CLINICAL DATA:  Short of breath, fatigue EXAM: CHEST - 2 VIEW COMPARISON:  CT chest 04/23/2020 FINDINGS: Stable enlarged cardiac silhouette. There is new perihilar airspace disease. No pleural fluid. No pneumothorax. No acute osseous abnormality. IMPRESSION: Perihilar airspace disease suggest multifocal pneumonia (including COVID pneumonia) versus pulmonary edema. Cardiomegaly. Electronically Signed   By: Stewart  Edmunds M.D.   On: 06/03/2020 13:26    Procedures Procedures (including critical care time)  Medications Ordered in ED Medications  albuterol (VENTOLIN HFA) 108 (90 Base) MCG/ACT inhaler 2 puff (2 puffs Inhalation Given 06/03/20 1305)  cefTRIAXone (ROCEPHIN) 1 g in sodium chloride 0.9 % 100 mL IVPB (has no administration in time range)  azithromycin (ZITHROMAX) tablet 500 mg (has no administration in time range)  sodium chloride 0.9 % bolus 1,000 mL (1,000 mLs Intravenous New Bag/Given (Non-Interop) 06/03/20  1334)  benzonatate (TESSALON) capsule 100 mg (100 mg Oral Given 06/03/20 1334)    ED Course  I have reviewed the triage vital signs and the nursing notes.  Pertinent labs & imaging results that were available during my care of the patient were reviewed by me and considered in my medical decision making (see chart for details).    MDM Rules/Calculators/A&P                          BP 122/64    Pulse 70    Temp 98.4 F (36.9 C) (Oral)    Resp (!) 22    LMP 07/14/2001    SpO2 95%   Final Clinical Impression(s) / ED Diagnoses Final diagnoses:  Multifocal pneumonia    Rx / DC Orders ED Discharge Orders    None     12 :55 PM Patient here with generalized weakness fatigue and shortness of breath.  Initial O2 sat is 92% on room air.  She endorsed fever loss of taste and smell and congestion.  However she has been fully vaccinated for COVID-19.  She also report having a negative Covid test several days prior.  Work-up initiated, will recheck Covid infection, and obtain chest x-ray to rule out pneumonia.   2:47 PM Covid test and flu tests are negative however chest x-ray shows evidence of multifocal pneumonia  Elevated BNP of 213.  In the setting of multifocal pneumonia, generalized weakness, and patient feels short of breath, will initiate Rocephin and Zithromax as treatment and will consult hospitalist for admission.  2:56 PM Appreciate consultation from [facility] Dr. who agrees to see and admit pt for further care.   Sally Wise was evaluated in Emergency Department on 06/03/2020 for the symptoms described in the history of present illness. She was evaluated in the context of the global COVID-19 pandemic, which  necessitated consideration that the patient might be at risk for infection with the SARS-CoV-2 virus that causes COVID-19. Institutional protocols and algorithms that pertain to the evaluation of patients at risk for COVID-19 are in a state of rapid change based on  information released by regulatory bodies including the CDC and federal and state organizations. These policies and algorithms were followed during the patient's care in the ED.    Fayrene Helperran, Tramond Slinker, PA-C 06/03/20 1502    Melene PlanFloyd, Dan, DO 06/03/20 1507

## 2020-06-03 NOTE — ED Notes (Signed)
Pt is still unable to provide urine sample at this time. 

## 2020-06-03 NOTE — ED Notes (Signed)
ED TO INPATIENT HANDOFF REPORT  Name/Age/Gender Sally Wise 74 y.o. female  Code Status Advance Directive Documentation     Most Recent Value  Type of Advance Directive Healthcare Power of Attorney, Living will  Pre-existing out of facility DNR order (yellow form or pink MOST form) --  "MOST" Form in Place? --      Home/SNF/Other Home  Chief Complaint Multifocal pneumonia [J18.9]  Level of Care/Admitting Diagnosis ED Disposition    ED Disposition Condition Comment   Admit  Hospital Area: [facility] [[id number]]  Level of Care: Med-Surg [16]  Covid Evaluation: Confirmed COVID Negative  Diagnosis: Multifocal pneumonia [[id number]]  Admitting Physician: Teddy Spike [[id number]]  Attending Physician: Teddy Spike [[id number]]       Medical History Past Medical History:  Diagnosis Date   Hematuria    evaluation with Dr. Etta Grandchild   Hypothyroidism 06/2010   Kidney stone 04/2004   Lichen sclerosus et atrophicus 2/13   biopsy proven   LVH (left ventricular hypertrophy)    Morbid obesity (HCC)    PAF (paroxysmal atrial fibrillation) (HCC)    a. Remote hx, re-established care 01/2013 with Dr. Eden Emms for recurrence. 2D echo 01/2013: mod LVH, EF 55-65%, no RWMA, grade 1 d/d. b. 06/2014: started on Xarelto.    Thrombocytopenia (HCC)    Vitamin D deficiency 06/2010   Wears glasses     Allergies No Known Allergies  IV Location/Drains/Wounds Patient Lines/Drains/Airways Status    Active Line/Drains/Airways    Name Placement date Placement time Site Days   Peripheral IV 06/03/20 Right Antecubital 06/03/20  1312  Antecubital  less than 1   Incision 08/09/13 Hand 08/09/13  0859   2490   Incision 08/09/13 Hand Left 08/09/13  0859   2490          Labs/Imaging Results for orders placed or performed during the hospital encounter of 06/03/20 (from the past 48 hour(s))  Basic metabolic panel     Status: Abnormal   Collection Time: 06/03/20 12:51 PM   Result Value Ref Range   Sodium 135 135 - 145 mmol/L   Potassium 3.7 3.5 - 5.1 mmol/L   Chloride 101 98 - 111 mmol/L   CO2 23 22 - 32 mmol/L   Glucose, Bld 109 (H) 70 - 99 mg/dL    Comment: Glucose reference range applies only to samples taken after fasting for at least 8 hours.   BUN 16 8 - 23 mg/dL   Creatinine, Ser 6.78 (H) 0.44 - 1.00 mg/dL   Calcium 8.4 (L) 8.9 - 10.3 mg/dL   GFR, Estimated 53 (L) >60 mL/min    Comment: (NOTE) Calculated using the CKD-EPI Creatinine Equation (2021)    Anion gap 11 5 - 15    Comment: Performed at [facility], [address] [place], [place] [address]  Brain natriuretic peptide     Status: Abnormal   Collection Time: 06/03/20 12:51 PM  Result Value Ref Range   B Natriuretic Peptide 213.6 (H) 0.0 - 100.0 pg/mL    Comment: Performed at [facility], [address], [place], [place] [address]  Resp Panel by RT-PCR (Flu A&B, Covid) Nasopharyngeal Swab     Status: None   Collection Time: 06/03/20  1:30 PM   Specimen: Nasopharyngeal Swab; Nasopharyngeal(NP) swabs in vial transport medium  Result Value Ref Range   SARS Coronavirus 2 by RT PCR NEGATIVE NEGATIVE    Comment: (NOTE) SARS-CoV-2 target nucleic acids are NOT  DETECTED.  The SARS-CoV-2 RNA is generally detectable in upper respiratory specimens during the acute phase of infection. The lowest concentration of SARS-CoV-2 viral copies this assay can detect is 138 copies/mL. A negative result does not preclude SARS-Cov-2 infection and should not be used as the sole basis for treatment or other patient management decisions. A negative result may occur with  improper specimen collection/handling, submission of specimen other than nasopharyngeal swab, presence of viral mutation(s) within the areas targeted by this assay, and inadequate number of viral copies(<138 copies/mL). A negative result must be combined with clinical observations, patient history, and  epidemiological information. The expected result is Negative.  Fact Sheet for Patients:  BloggerCourse.com  Fact Sheet for Healthcare Providers:  SeriousBroker.it  This test is no t yet approved or cleared by the [place] FDA and  has been authorized for detection and/or diagnosis of SARS-CoV-2 by FDA under an Emergency Use Authorization (EUA). This EUA will remain  in effect (meaning this test can be used) for the duration of the COVID-19 declaration under Section 564(b)(1) of the Act, 21 U.S.C.section 360bbb-3(b)(1), unless the authorization is terminated  or revoked sooner.       Influenza A by PCR NEGATIVE NEGATIVE   Influenza B by PCR NEGATIVE NEGATIVE    Comment: (NOTE) The Xpert Xpress SARS-CoV-2/FLU/RSV plus assay is intended as an aid in the diagnosis of influenza from Nasopharyngeal swab specimens and should not be used as a sole basis for treatment. Nasal washings and aspirates are unacceptable for Xpert Xpress SARS-CoV-2/FLU/RSV testing.  Fact Sheet for Patients: BloggerCourse.com  Fact Sheet for Healthcare Providers: SeriousBroker.it  This test is not yet approved or cleared by the [place] FDA and has been authorized for detection and/or diagnosis of SARS-CoV-2 by FDA under an Emergency Use Authorization (EUA). This EUA will remain in effect (meaning this test can be used) for the duration of the COVID-19 declaration under Section 564(b)(1) of the Act, 21 U.S.C. section 360bbb-3(b)(1), unless the authorization is terminated or revoked.  Performed at [facility], [address] [place], [place] [address]    DG Chest 2 View  Result Date: 06/03/2020 CLINICAL DATA:  Short of breath, fatigue EXAM: CHEST - 2 VIEW COMPARISON:  CT chest 04/23/2020 FINDINGS: Stable enlarged cardiac silhouette. There is new perihilar airspace disease. No  pleural fluid. No pneumothorax. No acute osseous abnormality. IMPRESSION: Perihilar airspace disease suggest multifocal pneumonia (including COVID pneumonia) versus pulmonary edema. Cardiomegaly. Electronically Signed   By: Genevive Bi M.D.   On: 06/03/2020 13:26    Pending Labs Unresulted Labs (From admission, onward)          Start     Ordered   06/03/20 1449  CBC with Differential/Platelet  Add-on,   AD        06/03/20 1448   06/03/20 1319  Urinalysis, Routine w reflex microscopic  Once,   STAT        06/03/20 1318          Vitals/Pain Today's Vitals   06/03/20 1400 06/03/20 1500 06/03/20 1600 06/03/20 1633  BP: 122/64 (!) 121/53 129/80   Pulse: 70 61 71 72  Resp: (!) 22 11 (!) 23 (!) 26  Temp:      TempSrc:      SpO2: 95% 92% 93% 95%  PainSc:        Isolation Precautions No active isolations  Medications Medications  albuterol (VENTOLIN HFA) 108 (90 Base) MCG/ACT inhaler 2 puff (2 puffs  Inhalation Given 06/03/20 1305)  cefTRIAXone (ROCEPHIN) 1 g in sodium chloride 0.9 % 100 mL IVPB (has no administration in time range)  azithromycin (ZITHROMAX) tablet 500 mg (has no administration in time range)  sodium chloride 0.9 % bolus 1,000 mL (1,000 mLs Intravenous New Bag/Given (Non-Interop) 06/03/20 1334)  benzonatate (TESSALON) capsule 100 mg (100 mg Oral Given 06/03/20 1334)    Mobility walks with device

## 2020-06-04 ENCOUNTER — Observation Stay (HOSPITAL_COMMUNITY): Payer: Medicare Other

## 2020-06-04 DIAGNOSIS — J189 Pneumonia, unspecified organism: Secondary | ICD-10-CM | POA: Diagnosis present

## 2020-06-04 DIAGNOSIS — Z96653 Presence of artificial knee joint, bilateral: Secondary | ICD-10-CM | POA: Diagnosis present

## 2020-06-04 DIAGNOSIS — E039 Hypothyroidism, unspecified: Secondary | ICD-10-CM

## 2020-06-04 DIAGNOSIS — Z87442 Personal history of urinary calculi: Secondary | ICD-10-CM | POA: Diagnosis not present

## 2020-06-04 DIAGNOSIS — D696 Thrombocytopenia, unspecified: Secondary | ICD-10-CM | POA: Diagnosis present

## 2020-06-04 DIAGNOSIS — Z79890 Hormone replacement therapy (postmenopausal): Secondary | ICD-10-CM | POA: Diagnosis not present

## 2020-06-04 DIAGNOSIS — D649 Anemia, unspecified: Secondary | ICD-10-CM | POA: Diagnosis present

## 2020-06-04 DIAGNOSIS — E02 Subclinical iodine-deficiency hypothyroidism: Secondary | ICD-10-CM | POA: Diagnosis not present

## 2020-06-04 DIAGNOSIS — Z79899 Other long term (current) drug therapy: Secondary | ICD-10-CM | POA: Diagnosis not present

## 2020-06-04 DIAGNOSIS — Z9851 Tubal ligation status: Secondary | ICD-10-CM | POA: Diagnosis not present

## 2020-06-04 DIAGNOSIS — Z8249 Family history of ischemic heart disease and other diseases of the circulatory system: Secondary | ICD-10-CM | POA: Diagnosis not present

## 2020-06-04 DIAGNOSIS — I5031 Acute diastolic (congestive) heart failure: Secondary | ICD-10-CM

## 2020-06-04 DIAGNOSIS — Z6841 Body Mass Index (BMI) 40.0 and over, adult: Secondary | ICD-10-CM | POA: Diagnosis not present

## 2020-06-04 DIAGNOSIS — J9601 Acute respiratory failure with hypoxia: Secondary | ICD-10-CM | POA: Diagnosis present

## 2020-06-04 DIAGNOSIS — I480 Paroxysmal atrial fibrillation: Secondary | ICD-10-CM

## 2020-06-04 DIAGNOSIS — I5033 Acute on chronic diastolic (congestive) heart failure: Secondary | ICD-10-CM | POA: Diagnosis present

## 2020-06-04 DIAGNOSIS — Z20822 Contact with and (suspected) exposure to covid-19: Secondary | ICD-10-CM | POA: Diagnosis present

## 2020-06-04 DIAGNOSIS — N3000 Acute cystitis without hematuria: Secondary | ICD-10-CM | POA: Diagnosis present

## 2020-06-04 LAB — CBC
HCT: 31.3 % — ABNORMAL LOW (ref 36.0–46.0)
Hemoglobin: 9.8 g/dL — ABNORMAL LOW (ref 12.0–15.0)
MCH: 29.3 pg (ref 26.0–34.0)
MCHC: 31.3 g/dL (ref 30.0–36.0)
MCV: 93.7 fL (ref 80.0–100.0)
Platelets: 122 10*3/uL — ABNORMAL LOW (ref 150–400)
RBC: 3.34 MIL/uL — ABNORMAL LOW (ref 3.87–5.11)
RDW: 16.1 % — ABNORMAL HIGH (ref 11.5–15.5)
WBC: 18.7 10*3/uL — ABNORMAL HIGH (ref 4.0–10.5)
nRBC: 0 % (ref 0.0–0.2)

## 2020-06-04 LAB — COMPREHENSIVE METABOLIC PANEL
ALT: 12 U/L (ref 0–44)
AST: 14 U/L — ABNORMAL LOW (ref 15–41)
Albumin: 3 g/dL — ABNORMAL LOW (ref 3.5–5.0)
Alkaline Phosphatase: 50 U/L (ref 38–126)
Anion gap: 11 (ref 5–15)
BUN: 17 mg/dL (ref 8–23)
CO2: 24 mmol/L (ref 22–32)
Calcium: 8.4 mg/dL — ABNORMAL LOW (ref 8.9–10.3)
Chloride: 102 mmol/L (ref 98–111)
Creatinine, Ser: 1.22 mg/dL — ABNORMAL HIGH (ref 0.44–1.00)
GFR, Estimated: 46 mL/min — ABNORMAL LOW (ref >60)
Glucose, Bld: 109 mg/dL — ABNORMAL HIGH (ref 70–99)
Potassium: 3.6 mmol/L (ref 3.5–5.1)
Sodium: 137 mmol/L (ref 135–145)
Total Bilirubin: 0.9 mg/dL (ref 0.3–1.2)
Total Protein: 6.3 g/dL — ABNORMAL LOW (ref 6.5–8.1)

## 2020-06-04 LAB — ECHOCARDIOGRAM COMPLETE
Area-P 1/2: 2.91 cm2
Height: 68 in
S' Lateral: 3.2 cm
Weight: 5661.41 oz

## 2020-06-04 LAB — PROCALCITONIN: Procalcitonin: 0.6 ng/mL

## 2020-06-04 MED ORDER — FUROSEMIDE 10 MG/ML IJ SOLN
20.0000 mg | Freq: Once | INTRAMUSCULAR | Status: AC
  Administered 2020-06-04: 20 mg via INTRAVENOUS
  Filled 2020-06-04: qty 2

## 2020-06-04 MED ORDER — IPRATROPIUM-ALBUTEROL 0.5-2.5 (3) MG/3ML IN SOLN
3.0000 mL | Freq: Three times a day (TID) | RESPIRATORY_TRACT | Status: DC
  Administered 2020-06-04 – 2020-06-08 (×11): 3 mL via RESPIRATORY_TRACT
  Filled 2020-06-04 (×12): qty 3

## 2020-06-04 MED ORDER — BUDESONIDE 0.25 MG/2ML IN SUSP
0.2500 mg | Freq: Two times a day (BID) | RESPIRATORY_TRACT | Status: DC
  Administered 2020-06-04 – 2020-06-08 (×9): 0.25 mg via RESPIRATORY_TRACT
  Filled 2020-06-04 (×9): qty 2

## 2020-06-04 MED ORDER — ARFORMOTEROL TARTRATE 15 MCG/2ML IN NEBU
15.0000 ug | INHALATION_SOLUTION | Freq: Two times a day (BID) | RESPIRATORY_TRACT | Status: DC
  Administered 2020-06-04 – 2020-06-08 (×8): 15 ug via RESPIRATORY_TRACT
  Filled 2020-06-04 (×10): qty 2

## 2020-06-04 MED ORDER — METHYLPREDNISOLONE SODIUM SUCC 40 MG IJ SOLR
40.0000 mg | Freq: Three times a day (TID) | INTRAMUSCULAR | Status: DC
  Administered 2020-06-04 – 2020-06-05 (×4): 40 mg via INTRAVENOUS
  Filled 2020-06-04 (×4): qty 1

## 2020-06-04 NOTE — Progress Notes (Signed)
Echocardiogram 2D Echocardiogram has been performed.  Warren Lacy Ravin Denardo 06/04/2020, 11:11 AM

## 2020-06-04 NOTE — Progress Notes (Signed)
[facility]                                                                              Patient Demographics  Sally Wise, is a 74 y.o. female, DOB - 04/09/1946, NLG:[id number]  Admit date - 06/03/2020   Admitting Physician Teddy Spike, DO  Outpatient Primary MD for the patient is Daisy Floro, MD  Outpatient specialists:   LOS - 0  days   Medical records reviewed and are as summarized below:    Chief Complaint  Patient presents with  . Shortness of Breath  . Fatigue       Brief summary   Patient is a 74 year old female with history of paroxysmal atrial fibrillation, hypothyroidism presented with coughing and dyspnea.  Patient reported nonproductive cough for last 1 week (other members of the family also sick with similar symptoms).  She tried OTC cough medication without any help.  Also reported fatigue, poor appetite. She had Covid vaccines, flu and Covid test on Thursday was negative.  Patient ported that her symptoms continue to worsen and she could not catch her breath. Chest x-ray showed multifocal pneumonia, COVID-19 test negative.  Patient was started on IV Rocephin and Zithromax.  Assessment & Plan    Principal Problem:   Acute respiratory failure with hypoxia (HCC) secondary to multifocal pneumonia Vs pulmonary edema -At the time of examination, patient is on 3 L O2, diffusely wheezing.  Patient reports she is not on any O2 at home. -Procalcitonin 0.6, WBC count 18.7,  - BNP 213.6 chest x-ray also showed possible pulmonary edema with cardiomegaly. Will give 1 dose of Lasix 20 mg IV x1, strict I's and O's and daily weights, 2D echocardiogram -Continue IV Rocephin, Zithromax -Continue scheduled duo nebs, prn albuterol , added Pulmicort, Brovana, flutter valve -Placed on IV Solu-Medrol 60 mg every 12 hours -Follow-up blood cultures, urine strep antigen, Legionella antigen   Active Problems:   Hypothyroidism -Continue Synthroid     PAF (paroxysmal atrial fibrillation) (HCC) -Currently heart rate controlled -Continue diltiazem 180 mg daily -Not on any anticoagulation, PTA (?  Due to chronic thrombocytopenia, fall risk)  Chronic thrombocytopenia, anemia -H&H close to baseline, platelet count stable  Morbid obesity Estimated body mass index is 53.8 kg/m as calculated from the following:   Height as of this encounter: 5\' 8"  (1.727 m).   Weight as of this encounter: 160.5 kg.  Code Status: Full CODE STATUS DVT Prophylaxis:  Lovenox  Family Communication: Discussed all imaging results, lab results, explained to the patient.  Called patient's husband on the phone, unable to make contact, left voicemail message.   Disposition Plan:     Status is: Observation  The patient will require care spanning > 2 midnights and should be moved to inpatient because: Inpatient level of care appropriate due to severity of illness  Dispo: The patient is from: Home              Anticipated d/c is to: Home              Anticipated d/c date is: 2 days  Patient currently is not medically stable to d/c.  Hypoxic, diffusely wheezing, and O2 3 L (not on O2 at home)  Time Spent in minutes 35 minutes  Procedures:  None  Consultants:   None  Antimicrobials:   Anti-infectives (From admission, onward)   Start     Dose/Rate Route Frequency Ordered Stop   06/04/20 1600  cefTRIAXone (ROCEPHIN) 2 g in sodium chloride 0.9 % 100 mL IVPB        2 g 200 mL/hr over 30 Minutes Intravenous Every 24 hours 06/03/20 1720 06/09/20 1559   06/04/20 1000  azithromycin (ZITHROMAX) tablet 500 mg        500 mg Oral Daily 06/03/20 1720 06/09/20 0959   06/03/20 1500  cefTRIAXone (ROCEPHIN) 1 g in sodium chloride 0.9 % 100 mL IVPB        1 g 200 mL/hr over 30 Minutes Intravenous  Once 06/03/20 1445 06/03/20 1713   06/03/20 1500  azithromycin (ZITHROMAX) tablet 500 mg        500 mg Oral  Once 06/03/20 1445 06/03/20 1644          Medications  Scheduled Meds: . arformoterol  15 mcg Nebulization BID  . azithromycin  500 mg Oral Daily  . benzonatate  100 mg Oral TID  . budesonide (PULMICORT) nebulizer solution  0.25 mg Nebulization BID  . cholecalciferol  5,000 Units Oral Daily  . diltiazem  180 mg Oral Daily  . enoxaparin (LOVENOX) injection  80 mg Subcutaneous Q24H  . furosemide  20 mg Intravenous Once  . guaiFENesin  600 mg Oral BID  . ipratropium-albuterol  3 mL Nebulization Q6H WA  . levothyroxine  112 mcg Oral Q0600  . methylPREDNISolone (SOLU-MEDROL) injection  40 mg Intravenous Q8H   Continuous Infusions: . cefTRIAXone (ROCEPHIN)  IV     PRN Meds:.acetaminophen **OR** acetaminophen, albuterol, ondansetron **OR** ondansetron (ZOFRAN) IV      Subjective:   Enedina FinnerJanice Restivo was seen and examined today.  Visibly short of breath, diffusely wheezing on 3 L O2.  Feeling miserable today.  Patient denies dizziness, chest pain, abdominal pain, N/V/D/C, new weakness, numbess, tingling. No acute events overnight.    Objective:   Vitals:   06/03/20 2110 06/04/20 0022 06/04/20 0540 06/04/20 0859  BP: (!) 109/96 (!) 119/44 (!) 100/48   Pulse: 79 78 64   Resp: 20 20 20    Temp: 100.1 F (37.8 C) 99.9 F (37.7 C) 97.8 F (36.6 C)   TempSrc: Oral Oral Oral   SpO2: 91% 90% 92% 94%  Weight:      Height:        Intake/Output Summary (Last 24 hours) at 06/04/2020 [id number] Last data filed at 06/04/2020 0600 Gross per 24 hour  Intake 293.33 ml  Output 300 ml  Net -6.67 ml     Wt Readings from Last 3 Encounters:  06/03/20 (!) 160.5 kg  10/04/19 (!) 160.5 kg  07/04/19 (!) 158.1 kg     Exam  General: Alert and oriented x 3, NAD  Cardiovascular: S1 S2 auscultated,  RRR  Respiratory: Bilateral diffuse expiratory wheezing  Gastrointestinal: Soft, nontender, nondistended, + bowel sounds  Ext: trace pedal edema bilaterally  Neuro: no new deficits  Musculoskeletal: No digital cyanosis,  clubbing  Skin: No rashes  Psych: Normal affect and demeanor, alert and oriented x3    Data Reviewed:  I have personally reviewed following labs and imaging studies  Micro Results Recent Results (from the past 240 hour(s))  Resp Panel  by RT-PCR (Flu A&B, Covid) Nasopharyngeal Swab     Status: None   Collection Time: 06/03/20  1:30 PM   Specimen: Nasopharyngeal Swab; Nasopharyngeal(NP) swabs in vial transport medium  Result Value Ref Range Status   SARS Coronavirus 2 by RT PCR NEGATIVE NEGATIVE Final    Comment: (NOTE) SARS-CoV-2 target nucleic acids are NOT DETECTED.  The SARS-CoV-2 RNA is generally detectable in upper respiratory specimens during the acute phase of infection. The lowest concentration of SARS-CoV-2 viral copies this assay can detect is 138 copies/mL. A negative result does not preclude SARS-Cov-2 infection and should not be used as the sole basis for treatment or other patient management decisions. A negative result may occur with  improper specimen collection/handling, submission of specimen other than nasopharyngeal swab, presence of viral mutation(s) within the areas targeted by this assay, and inadequate number of viral copies(<138 copies/mL). A negative result must be combined with clinical observations, patient history, and epidemiological information. The expected result is Negative.  Fact Sheet for Patients:  BloggerCourse.com  Fact Sheet for Healthcare Providers:  SeriousBroker.it  This test is no t yet approved or cleared by the [place] FDA and  has been authorized for detection and/or diagnosis of SARS-CoV-2 by FDA under an Emergency Use Authorization (EUA). This EUA will remain  in effect (meaning this test can be used) for the duration of the COVID-19 declaration under Section 564(b)(1) of the Act, 21 U.S.C.section 360bbb-3(b)(1), unless the authorization is terminated  or revoked sooner.        Influenza A by PCR NEGATIVE NEGATIVE Final   Influenza B by PCR NEGATIVE NEGATIVE Final    Comment: (NOTE) The Xpert Xpress SARS-CoV-2/FLU/RSV plus assay is intended as an aid in the diagnosis of influenza from Nasopharyngeal swab specimens and should not be used as a sole basis for treatment. Nasal washings and aspirates are unacceptable for Xpert Xpress SARS-CoV-2/FLU/RSV testing.  Fact Sheet for Patients: BloggerCourse.com  Fact Sheet for Healthcare Providers: SeriousBroker.it  This test is not yet approved or cleared by the [place] FDA and has been authorized for detection and/or diagnosis of SARS-CoV-2 by FDA under an Emergency Use Authorization (EUA). This EUA will remain in effect (meaning this test can be used) for the duration of the COVID-19 declaration under Section 564(b)(1) of the Act, 21 U.S.C. section 360bbb-3(b)(1), unless the authorization is terminated or revoked.  Performed at [facility], [address] [place], [place] [address]     Radiology Reports DG Chest 2 View  Result Date: 06/03/2020 CLINICAL DATA:  Short of breath, fatigue EXAM: CHEST - 2 VIEW COMPARISON:  CT chest 04/23/2020 FINDINGS: Stable enlarged cardiac silhouette. There is new perihilar airspace disease. No pleural fluid. No pneumothorax. No acute osseous abnormality. IMPRESSION: Perihilar airspace disease suggest multifocal pneumonia (including COVID pneumonia) versus pulmonary edema. Cardiomegaly. Electronically Signed   By: Genevive Bi M.D.   On: 06/03/2020 13:26    Lab Data:  CBC: Recent Labs  Lab 06/03/20 1707 06/04/20 0502  WBC 18.6* 18.7*  NEUTROABS 11.0*  --   HGB 10.4* 9.8*  HCT 33.1* 31.3*  MCV 92.2 93.7  PLT 126* 122*   Basic Metabolic Panel: Recent Labs  Lab 06/03/20 1251 06/04/20 0502  NA 135 137  K 3.7 3.6  CL 101 102  CO2 23 24  GLUCOSE 109* 109*  BUN 16 17   CREATININE 1.09* 1.22*  CALCIUM 8.4* 8.4*   GFR: Estimated Creatinine Clearance: 63.5 mL/min (A) (by C-G formula  based on SCr of 1.22 mg/dL (H)). Liver Function Tests: Recent Labs  Lab 06/04/20 0502  AST 14*  ALT 12  ALKPHOS 50  BILITOT 0.9  PROT 6.3*  ALBUMIN 3.0*   No results for input(s): LIPASE, AMYLASE in the last 168 hours. No results for input(s): AMMONIA in the last 168 hours. Coagulation Profile: No results for input(s): INR, PROTIME in the last 168 hours. Cardiac Enzymes: No results for input(s): CKTOTAL, CKMB, CKMBINDEX, TROPONINI in the last 168 hours. BNP (last 3 results) No results for input(s): PROBNP in the last 8760 hours. HbA1C: No results for input(s): HGBA1C in the last 72 hours. CBG: No results for input(s): GLUCAP in the last 168 hours. Lipid Profile: No results for input(s): CHOL, HDL, LDLCALC, TRIG, CHOLHDL, LDLDIRECT in the last 72 hours. Thyroid Function Tests: No results for input(s): TSH, T4TOTAL, FREET4, T3FREE, THYROIDAB in the last 72 hours. Anemia Panel: No results for input(s): VITAMINB12, FOLATE, FERRITIN, TIBC, IRON, RETICCTPCT in the last 72 hours. Urine analysis:    Component Value Date/Time   COLORURINE YELLOW 01/15/2013 1538   APPEARANCEUR CLEAR 01/15/2013 1538   LABSPEC 1.010 01/15/2013 1538   PHURINE 6.0 01/15/2013 1538   GLUCOSEU NEGATIVE 01/15/2013 1538   HGBUR MODERATE (A) 01/15/2013 1538   BILIRUBINUR neg 04/08/2016 0916   KETONESUR NEGATIVE 01/15/2013 1538   PROTEINUR Trace 04/08/2016 0916   PROTEINUR NEGATIVE 01/15/2013 1538   UROBILINOGEN negative 04/08/2016 0916   UROBILINOGEN 0.2 01/15/2013 1538   NITRITE neg 04/08/2016 0916   NITRITE NEGATIVE 01/15/2013 1538   LEUKOCYTESUR small (1+) (A) 04/08/2016 0916     Lonita Debes M.D. [facility] 06/04/2020, 9:17 AM   Call night coverage person covering after 7pm

## 2020-06-05 ENCOUNTER — Inpatient Hospital Stay (HOSPITAL_COMMUNITY): Payer: Medicare Other

## 2020-06-05 DIAGNOSIS — E02 Subclinical iodine-deficiency hypothyroidism: Secondary | ICD-10-CM

## 2020-06-05 DIAGNOSIS — J9601 Acute respiratory failure with hypoxia: Secondary | ICD-10-CM | POA: Diagnosis not present

## 2020-06-05 DIAGNOSIS — J189 Pneumonia, unspecified organism: Secondary | ICD-10-CM | POA: Diagnosis not present

## 2020-06-05 LAB — BASIC METABOLIC PANEL
Anion gap: 12 (ref 5–15)
BUN: 31 mg/dL — ABNORMAL HIGH (ref 8–23)
CO2: 23 mmol/L (ref 22–32)
Calcium: 8.6 mg/dL — ABNORMAL LOW (ref 8.9–10.3)
Chloride: 101 mmol/L (ref 98–111)
Creatinine, Ser: 1.23 mg/dL — ABNORMAL HIGH (ref 0.44–1.00)
GFR, Estimated: 46 mL/min — ABNORMAL LOW (ref >60)
Glucose, Bld: 153 mg/dL — ABNORMAL HIGH (ref 70–99)
Potassium: 4.2 mmol/L (ref 3.5–5.1)
Sodium: 136 mmol/L (ref 135–145)

## 2020-06-05 LAB — URINALYSIS, ROUTINE W REFLEX MICROSCOPIC
Bilirubin Urine: NEGATIVE
Glucose, UA: NEGATIVE mg/dL
Ketones, ur: NEGATIVE mg/dL
Leukocytes,Ua: NEGATIVE
Nitrite: NEGATIVE
Protein, ur: NEGATIVE mg/dL
Specific Gravity, Urine: 1.015 (ref 1.005–1.030)
pH: 5.5 (ref 5.0–8.0)

## 2020-06-05 LAB — CBC
HCT: 32.1 % — ABNORMAL LOW (ref 36.0–46.0)
Hemoglobin: 9.9 g/dL — ABNORMAL LOW (ref 12.0–15.0)
MCH: 28.5 pg (ref 26.0–34.0)
MCHC: 30.8 g/dL (ref 30.0–36.0)
MCV: 92.5 fL (ref 80.0–100.0)
Platelets: 136 10*3/uL — ABNORMAL LOW (ref 150–400)
RBC: 3.47 MIL/uL — ABNORMAL LOW (ref 3.87–5.11)
RDW: 15.9 % — ABNORMAL HIGH (ref 11.5–15.5)
WBC: 8.6 10*3/uL (ref 4.0–10.5)
nRBC: 0 % (ref 0.0–0.2)

## 2020-06-05 LAB — STREP PNEUMONIAE URINARY ANTIGEN: Strep Pneumo Urinary Antigen: NEGATIVE

## 2020-06-05 LAB — URINALYSIS, MICROSCOPIC (REFLEX)
Bacteria, UA: NONE SEEN
Squamous Epithelial / HPF: NONE SEEN (ref 0–5)

## 2020-06-05 MED ORDER — FUROSEMIDE 40 MG PO TABS: 40.0000 mg | ORAL_TABLET | Freq: Every day | ORAL | Status: DC

## 2020-06-05 MED ORDER — URELLE 81 MG PO TABS
1.0000 | ORAL_TABLET | Freq: Three times a day (TID) | ORAL | Status: AC
  Administered 2020-06-05 – 2020-06-07 (×6): 81 mg via ORAL
  Filled 2020-06-05 (×6): qty 1

## 2020-06-05 MED ORDER — METHYLPREDNISOLONE SODIUM SUCC 40 MG IJ SOLR
40.0000 mg | Freq: Two times a day (BID) | INTRAMUSCULAR | Status: DC
  Administered 2020-06-05 – 2020-06-08 (×6): 40 mg via INTRAVENOUS
  Filled 2020-06-05 (×6): qty 1

## 2020-06-05 MED ORDER — FUROSEMIDE 40 MG PO TABS
40.0000 mg | ORAL_TABLET | Freq: Every day | ORAL | Status: AC
  Administered 2020-06-05 – 2020-06-06 (×2): 40 mg via ORAL
  Filled 2020-06-05 (×2): qty 1

## 2020-06-05 NOTE — Evaluation (Signed)
Physical Therapy Evaluation Patient Details Name: Sally Wise MRN: [id number] DOB: 1945/10/14 Today's Date: 06/05/2020   History of Present Illness  Patient is 74 y.o. female with PMH significant for PAF, CHF, hypothyroidism, obesity, bil TKA. Patient present to [facility] with SOB and fatigue, found ot be hypoxic in ED. Covid and Flu negative.    Clinical Impression  Sally Wise is 74 y.o. female admitted with above HPI and diagnosis. Patient is currently limited by functional impairments below (see PT problem list). Patient lives with her husband and daugther and is modified independent with SPC for community mobility at baseline. Patient currently requires 2L/min to maintain Sats of >89% with mobility and min assist to complete transfers and gait with RW. Patient will benefit from continued skilled PT interventions to address impairments and progress independence with mobility, recommending HHPT follow up and OT consult for assist with self care. Acute PT will follow and progress as able.     Follow Up Recommendations Home health PT;Supervision - Intermittent    Equipment Recommendations  Rolling walker with 5" wheels    Recommendations for Other Services OT consult     Precautions / Restrictions Precautions Precautions: Fall Precaution Comments: watch O2 sats, pt reports about 3 falls at home in last 6 months Restrictions Weight Bearing Restrictions: No      Mobility  Bed Mobility Overal bed mobility: Needs Assistance Bed Mobility: Supine to Sit     Supine to sit: Min guard;HOB elevated     General bed mobility comments: pt taking extra time, using bed rail. guarding for safety.     Transfers Overall transfer level: Needs assistance Equipment used: Rolling walker (2 wheeled);1 person hand held assist Transfers: Sit to/from [employer] Sit to Stand: Min assist Stand pivot transfers: Min assist       General transfer comment: 1HHA for stand pivot  to move bed>BSC. pt mildly unsteady and cues required for safe reaching to [facility]. Min assist for power up from EOB and BSC with cues for bil UE use for power up.   Ambulation/Gait Ambulation/Gait assistance: Min assist Gait Distance (Feet): 150 Feet Assistive device: Rolling walker (2 wheeled) Gait Pattern/deviations: Step-through pattern;Decreased stride length;Wide base of support;Trunk flexed Gait velocity: decr   General Gait Details: VC's for safe proximity to RW throughout gait and cues for posture. pt on 2L/min throughout and sats ranged from 89-92% with gait. HR 80-110's.   Stairs            Wheelchair Mobility    Modified Rankin (Stroke Patients Only)       Balance Overall balance assessment: Needs assistance Sitting-balance support: Feet supported Sitting balance-Leahy Scale: Fair     Standing balance support: During functional activity;Bilateral upper extremity supported Standing balance-Leahy Scale: Poor Standing balance comment: reliant on external support. pt declined to attempt pericare in standing due to needing bil UE support to stand.                             Pertinent Vitals/Pain Pain Assessment: No/denies pain    Home Living Family/patient expects to be discharged to:: Private residence Living Arrangements: Spouse/significant other;Children Available Help at Discharge: Family Type of Home: House Home Access: Stairs to enter Entrance Stairs-Rails: Right Entrance Stairs-Number of Steps: 3 Home Layout: One level Home Equipment: Cane - single point;Grab bars - tub/shower      Prior Function Level of Independence: Independent  Comments: pt uses SPC for safety/security with community mobility, does not need device in home.      Hand Dominance   Dominant Hand: Right    Extremity/Trunk Assessment   Upper Extremity Assessment Upper Extremity Assessment: Overall WFL for tasks assessed    Lower Extremity  Assessment Lower Extremity Assessment: Generalized weakness    Cervical / Trunk Assessment Cervical / Trunk Assessment: Other exceptions Cervical / Trunk Exceptions: body habitus  Communication   Communication: No difficulties  Cognition Arousal/Alertness: Awake/alert Behavior During Therapy: WFL for tasks assessed/performed Overall Cognitive Status: Within Functional Limits for tasks assessed                                        General Comments      Exercises     Assessment/Plan    PT Assessment Patient needs continued PT services  PT Problem List Decreased strength;Decreased activity tolerance;Decreased balance;Decreased mobility;Decreased knowledge of use of DME;Decreased safety awareness;Decreased knowledge of precautions;Obesity       PT Treatment Interventions DME instruction;Stair training;Gait training;Functional mobility training;Therapeutic activities;Therapeutic exercise;Balance training;Patient/family education    PT Goals (Current goals can be found in the Care Plan section)  Acute Rehab PT Goals Patient Stated Goal: regain strength, balance, and endurance PT Goal Formulation: With patient Time For Goal Achievement: 06/19/20 Potential to Achieve Goals: Good    Frequency Min 3X/week   Barriers to discharge        Co-evaluation               AM-PAC PT "6 Clicks" Mobility  Outcome Measure Help needed turning from your back to your side while in a flat bed without using bedrails?: A Little Help needed moving from lying on your back to sitting on the side of a flat bed without using bedrails?: A Little Help needed moving to and from a bed to a chair (including a wheelchair)?: A Little Help needed standing up from a chair using your arms (e.g., wheelchair or bedside chair)?: A Little Help needed to walk in hospital room?: A Little Help needed climbing 3-5 steps with a railing? : A Lot 6 Click Score: 17    End of Session Equipment  Utilized During Treatment: Gait belt Activity Tolerance: Patient tolerated treatment well Patient left: in chair;with call bell/phone within reach Nurse Communication: Mobility status PT Visit Diagnosis: Other abnormalities of gait and mobility (R26.89);Muscle weakness (generalized) (M62.81);Difficulty in walking, not elsewhere classified (R26.2);History of falling (Z91.81)    Time: 4356-8616 PT Time Calculation (min) (ACUTE ONLY): 41 min   Charges:   PT Evaluation $PT Eval Low Complexity: 1 Low PT Treatments $Gait Training: 8-22 mins $Therapeutic Activity: 8-22 mins      Wynn Maudlin, DPT Acute Rehabilitation Services  Office [phone] Pager [phone]  06/05/2020 1:27 PM

## 2020-06-05 NOTE — Progress Notes (Signed)
[facility]                                                                              Patient Demographics  Sally Wise, is a 74 y.o. female, DOB - 02/19/1946, ZDG:[id number]  Admit date - 06/03/2020   Admitting Physician Teddy Spike, DO  Outpatient Primary MD for the patient is Daisy Floro, MD  Outpatient specialists:   LOS - 1  days   Medical records reviewed and are as summarized below:    Chief Complaint  Patient presents with  . Shortness of Breath  . Fatigue       Brief summary   Patient is a 74 year old female with history of paroxysmal atrial fibrillation, hypothyroidism presented with coughing and dyspnea.  Patient reported nonproductive cough for last 1 week (other members of the family also sick with similar symptoms).  She tried OTC cough medication without any help.  Also reported fatigue, poor appetite. She had Covid vaccines, flu and Covid test on Thursday was negative.  Patient ported that her symptoms continue to worsen and she could not catch her breath. Chest x-ray showed multifocal pneumonia, COVID-19 test negative.  Patient was started on IV Rocephin and Zithromax.  Assessment & Plan    Principal Problem:   Acute respiratory failure with hypoxia (HCC) secondary to multifocal pneumonia - wheezing improving, down to 2 L, feels shortness of breath is improving. No fevers or chills, leukocytosis improving. Not on home O2 -IV Rocephin, Zithromax. Wheezing improving, hence taper Solu-Medrol to 40 mg every 12 hours - Follow blood cultures, urine strep antigen, Legionella antigen -Continue duo nebs, Pulmicort, Brovana, flutter valve - BNP 213.6 chest x-ray also showed possible pulmonary edema with cardiomegaly.   Active Problems: Acute on chronic diastolic CHF -BNP 213.6 at the time of admission with chest x-ray showing possible pulmonary edema, cardiomegaly, received Lasix 40 mg IV x1 -Strict I's and O's and daily weights    -2D echo showed EF of 55 to 60% with grade 1 diastolic dysfunction -Placed on Lasix 40 mg p.o. daily for 2 days, then reassess  Acute cystitis -Complaining of spasms and dysuria, obtain UA and culture -Continue current antibiotics, Rocephin should cover    Hypothyroidism -Continue Synthroid    PAF (paroxysmal atrial fibrillation) (HCC) -HR controlled, continue diltiazem 180 mg daily -Not on any anticoagulation, PTA (Due to chronic thrombocytopenia, fall risk)  Chronic thrombocytopenia, anemia -H&H close to baseline (improving), platelet count stable. Follows Dr. Clelia Croft outpatient  Morbid obesity Estimated body mass index is 49.88 kg/m as calculated from the following:   Height as of this encounter:  (1.727 m).   Weight as of this encounter: 148.8 kg.  Code Status: Full CODE STATUS DVT Prophylaxis:  Lovenox  Family Communication: Discussed all imaging results, lab results, explained to the patient's husband on the phone   Disposition Plan:     Status is: Inpatient  The patient will require  Inpatient level of care appropriate due to severity of illness  Dispo: The patient is from: Home              Anticipated d/c is to:  Home              Anticipated d/c date is: 2 days              Patient currently is not medically stable to d/c. Still hypoxia, wheezing improving but not currently at baseline, on 2 L O2 (not on O2 at home)  Time Spent in minutes 35 minutes  Procedures:  2D echo  Consultants:   None  Antimicrobials:   Anti-infectives (From admission, onward)   Start     Dose/Rate Route Frequency Ordered Stop   06/04/20 1600  cefTRIAXone (ROCEPHIN) 2 g in sodium chloride 0.9 % 100 mL IVPB        2 g 200 mL/hr over 30 Minutes Intravenous Every 24 hours 06/03/20 1720 06/09/20 1559   06/04/20 1000  azithromycin (ZITHROMAX) tablet 500 mg        500 mg Oral Daily 06/03/20 1720 06/09/20 0959   06/03/20 1500  cefTRIAXone (ROCEPHIN) 1 g in sodium chloride 0.9 % 100  mL IVPB        1 g 200 mL/hr over 30 Minutes Intravenous  Once 06/03/20 1445 06/03/20 1713   06/03/20 1500  azithromycin (ZITHROMAX) tablet 500 mg        500 mg Oral  Once 06/03/20 1445 06/03/20 1644         Medications  Scheduled Meds: . arformoterol  15 mcg Nebulization BID  . azithromycin  500 mg Oral Daily  . benzonatate  100 mg Oral TID  . budesonide (PULMICORT) nebulizer solution  0.25 mg Nebulization BID  . cholecalciferol  5,000 Units Oral Daily  . diltiazem  180 mg Oral Daily  . enoxaparin (LOVENOX) injection  80 mg Subcutaneous Q24H  . guaiFENesin  600 mg Oral BID  . ipratropium-albuterol  3 mL Nebulization TID  . levothyroxine  112 mcg Oral Q0600  . methylPREDNISolone (SOLU-MEDROL) injection  40 mg Intravenous Q8H   Continuous Infusions: . cefTRIAXone (ROCEPHIN)  IV 2 g (06/04/20 1630)   PRN Meds:.acetaminophen **OR** acetaminophen, albuterol, ondansetron **OR** ondansetron (ZOFRAN) IV      Subjective:   Ivette Castronova was seen and examined today. Feeling 50 to 60% better today, wheezing but improving from yesterday. No chest pain. On 2 L O2. No fevers or chills. Still feels weak. Patient denies dizziness, chest pain, abdominal pain, N/V/D/C. No acute events overnight  Objective:   Vitals:   06/05/20 0739 06/05/20 0833 06/05/20 0900 06/05/20 1047  BP: (!) 118/59   (!) 125/59  Pulse: (!) 57   65  Resp: 20     Temp: 97.7 F (36.5 C)     TempSrc: Oral     SpO2: 95% 92%    Weight:   (!) 148.8 kg   Height:        Intake/Output Summary (Last 24 hours) at 06/05/2020 1145 Last data filed at 06/05/2020 1131 Gross per 24 hour  Intake 820 ml  Output 680 ml  Net 140 ml     Wt Readings from Last 3 Encounters:  06/05/20 (!) 148.8 kg  10/04/19 (!) 160.5 kg  07/04/19 (!) 158.1 kg   Physical Exam  General: Alert and oriented x 3, NAD  Cardiovascular: S1 S2 clear, RRR. No pedal edema b/l  Respiratory: Diminished breath sounds with scattered  wheezing  Gastrointestinal: Soft, nontender, nondistended, NBS  Ext: no pedal edema bilaterally  Neuro: no new deficits  Musculoskeletal: No cyanosis, clubbing  Skin: No rashes  Psych: Normal affect and demeanor,  alert and oriented x3    Data Reviewed:  I have personally reviewed following labs and imaging studies  Micro Results Recent Results (from the past 240 hour(s))  Resp Panel by RT-PCR (Flu A&B, Covid) Nasopharyngeal Swab     Status: None   Collection Time: 06/03/20  1:30 PM   Specimen: Nasopharyngeal Swab; Nasopharyngeal(NP) swabs in vial transport medium  Result Value Ref Range Status   SARS Coronavirus 2 by RT PCR NEGATIVE NEGATIVE Final    Comment: (NOTE) SARS-CoV-2 target nucleic acids are NOT DETECTED.  The SARS-CoV-2 RNA is generally detectable in upper respiratory specimens during the acute phase of infection. The lowest concentration of SARS-CoV-2 viral copies this assay can detect is 138 copies/mL. A negative result does not preclude SARS-Cov-2 infection and should not be used as the sole basis for treatment or other patient management decisions. A negative result may occur with  improper specimen collection/handling, submission of specimen other than nasopharyngeal swab, presence of viral mutation(s) within the areas targeted by this assay, and inadequate number of viral copies(<138 copies/mL). A negative result must be combined with clinical observations, patient history, and epidemiological information. The expected result is Negative.  Fact Sheet for Patients:  BloggerCourse.com  Fact Sheet for Healthcare Providers:  SeriousBroker.it  This test is no t yet approved or cleared by the [place] FDA and  has been authorized for detection and/or diagnosis of SARS-CoV-2 by FDA under an Emergency Use Authorization (EUA). This EUA will remain  in effect (meaning this test can be used) for the duration  of the COVID-19 declaration under Section 564(b)(1) of the Act, 21 U.S.C.section 360bbb-3(b)(1), unless the authorization is terminated  or revoked sooner.       Influenza A by PCR NEGATIVE NEGATIVE Final   Influenza B by PCR NEGATIVE NEGATIVE Final    Comment: (NOTE) The Xpert Xpress SARS-CoV-2/FLU/RSV plus assay is intended as an aid in the diagnosis of influenza from Nasopharyngeal swab specimens and should not be used as a sole basis for treatment. Nasal washings and aspirates are unacceptable for Xpert Xpress SARS-CoV-2/FLU/RSV testing.  Fact Sheet for Patients: BloggerCourse.com  Fact Sheet for Healthcare Providers: SeriousBroker.it  This test is not yet approved or cleared by the [place] FDA and has been authorized for detection and/or diagnosis of SARS-CoV-2 by FDA under an Emergency Use Authorization (EUA). This EUA will remain in effect (meaning this test can be used) for the duration of the COVID-19 declaration under Section 564(b)(1) of the Act, 21 U.S.C. section 360bbb-3(b)(1), unless the authorization is terminated or revoked.  Performed at [facility], [address] [place], [place] [address]   Culture, blood (routine x 2)     Status: None (Preliminary result)   Collection Time: 06/04/20 10:05 AM   Specimen: BLOOD  Result Value Ref Range Status   Specimen Description   Final    BLOOD LEFT ANTECUBITAL Performed at [facility], [address], [place], [place] [address]    Special Requests   Final    BOTTLES DRAWN AEROBIC AND ANAEROBIC BACTEROIDES CACCAE Performed at [facility], [address] [place], [place] [address]    Culture   Final    NO GROWTH < 24 HOURS Performed at [facility], [address] [place], [place] [address]    Report Status PENDING  Incomplete  Culture, blood (routine x 2)     Status: None (Preliminary  result)   Collection Time: 06/04/20 10:11  AM   Specimen: BLOOD LEFT HAND  Result Value Ref Range Status   Specimen Description   Final    BLOOD LEFT HAND Performed at [facility], [address], [place], [place] [address]    Special Requests   Final    BOTTLES DRAWN AEROBIC ONLY Blood Culture adequate volume Performed at [facility], [address], [place], [place] [address]    Culture   Final    NO GROWTH < 24 HOURS Performed at [facility], [address], [place], [place] [address]    Report Status PENDING  Incomplete    Radiology Reports DG Chest 2 View  Result Date: 06/03/2020 CLINICAL DATA:  Short of breath, fatigue EXAM: CHEST - 2 VIEW COMPARISON:  CT chest 04/23/2020 FINDINGS: Stable enlarged cardiac silhouette. There is new perihilar airspace disease. No pleural fluid. No pneumothorax. No acute osseous abnormality. IMPRESSION: Perihilar airspace disease suggest multifocal pneumonia (including COVID pneumonia) versus pulmonary edema. Cardiomegaly. Electronically Signed   By: Genevive Bi M.D.   On: 06/03/2020 13:26   ECHOCARDIOGRAM COMPLETE  Result Date: 06/04/2020    ECHOCARDIOGRAM REPORT   Patient Name:   Sally Wise [facility] Date of Exam: 06/04/2020 Medical Rec #:  [id number]        Height:       68.0 in Accession #:    [id number]       Weight:       353.8 lb Date of Birth:  1945-12-10         BSA:          2.605 m Patient Age:    74 years         BP:           103/75 mmHg Patient Gender: F                HR:           68 bpm. Exam Location:  Inpatient Procedure: 2D Echo, Color Doppler and Cardiac Doppler Indications:    I50.31 Acute diastolic (congestive) heart failure  History:        Patient has prior history of Echocardiogram examinations, most                 recent 07/12/2014. Arrythmias:Atrial Fibrillation.  Sonographer:    Irving Burton Senior RDCS Referring Phys: [id number] Elwanda Moger K Canda Podgorski  Sonographer Comments: Technically difficult  study due to poor echo windows. Technically difficult due to morbid obesity and lung interference. IMPRESSIONS  1. Left ventricular ejection fraction, by estimation, is 55 to 60%. The left ventricle has normal function. The left ventricle has no regional wall motion abnormalities. Left ventricular diastolic parameters are consistent with Grade I diastolic dysfunction (impaired relaxation).  2. Right ventricular systolic function is normal. The right ventricular size is normal. Tricuspid regurgitation signal is inadequate for assessing PA pressure.  3. The mitral valve is grossly normal. Trivial mitral valve regurgitation.  4. The aortic valve is tricuspid. Aortic valve regurgitation is not visualized.  5. The inferior vena cava is dilated in size with <50% respiratory variability, suggesting right atrial pressure of 15 mmHg. Comparison(s): Prior images unable to be directly viewed, comparison made by report only. Changes from prior study are noted. 07/12/2014: LVEF 50-55%, grade 1 DD. FINDINGS  Left Ventricle: Left ventricular ejection fraction, by estimation, is 55 to 60%. The left ventricle has normal function. The left ventricle has no regional wall motion abnormalities. The left ventricular internal cavity  size was normal in size. There is  no left ventricular hypertrophy. Left ventricular diastolic parameters are consistent with Grade I diastolic dysfunction (impaired relaxation). Indeterminate filling pressures. Right Ventricle: The right ventricular size is normal. No increase in right ventricular wall thickness. Right ventricular systolic function is normal. Tricuspid regurgitation signal is inadequate for assessing PA pressure. Left Atrium: Left atrial size was normal in size. Right Atrium: Right atrial size was normal in size. Pericardium: There is no evidence of pericardial effusion. Mitral Valve: The mitral valve is grossly normal. Trivial mitral valve regurgitation. Tricuspid Valve: The tricuspid valve  is grossly normal. Tricuspid valve regurgitation is trivial. Aortic Valve: The aortic valve is tricuspid. Aortic valve regurgitation is not visualized. Pulmonic Valve: The pulmonic valve was normal in structure. Pulmonic valve regurgitation is not visualized. Aorta: The aortic root and ascending aorta are structurally normal, with no evidence of dilitation. Venous: The inferior vena cava is dilated in size with less than 50% respiratory variability, suggesting right atrial pressure of 15 mmHg. IAS/Shunts: No atrial level shunt detected by color flow Doppler.  LEFT VENTRICLE PLAX 2D LVIDd:         4.60 cm  Diastology LVIDs:         3.20 cm  LV e' medial:    7.51 cm/s LV PW:         1.10 cm  LV E/e' medial:  11.2 LV IVS:        0.90 cm  LV e' lateral:   6.64 cm/s LVOT diam:     2.30 cm  LV E/e' lateral: 12.7 LV SV:         94 LV SV Index:   36 LVOT Area:     4.15 cm  RIGHT VENTRICLE RV S prime:     13.50 cm/s TAPSE (M-mode): 2.4 cm LEFT ATRIUM             Index       RIGHT ATRIUM           Index LA diam:        4.00 cm 1.54 cm/m  RA Area:     20.80 cm LA Vol (A2C):   63.5 ml 24.38 ml/m RA Volume:   58.40 ml  22.42 ml/m LA Vol (A4C):   65.5 ml 25.15 ml/m LA Biplane Vol: 65.8 ml 25.26 ml/m  AORTIC VALVE LVOT Vmax:   92.00 cm/s LVOT Vmean:  66.500 cm/s LVOT VTI:    0.227 m  AORTA Ao Root diam: 3.10 cm Ao Asc diam:  3.10 cm MITRAL VALVE MV Area (PHT): 2.91 cm    SHUNTS MV Decel Time: 261 msec    Systemic VTI:  0.23 m MV E velocity: 84.40 cm/s  Systemic Diam: 2.30 cm MV A velocity: 82.70 cm/s MV E/A ratio:  1.02 Zoila ShutterKenneth Hilty MD Electronically signed by Zoila ShutterKenneth Hilty MD Signature Date/Time: 06/04/2020/11:57:47 AM    Final     Lab Data:  CBC: Recent Labs  Lab 06/03/20 1707 06/04/20 0502 06/05/20 0518  WBC 18.6* 18.7* 8.6  NEUTROABS 11.0*  --   --   HGB 10.4* 9.8* 9.9*  HCT 33.1* 31.3* 32.1*  MCV 92.2 93.7 92.5  PLT 126* 122* 136*   Basic Metabolic Panel: Recent Labs  Lab 06/03/20 1251  06/04/20 0502 06/05/20 0518  NA 135 137 136  K 3.7 3.6 4.2  CL 101 102 101  CO2 23 24 23   GLUCOSE 109* 109* 153*  BUN 16 17 31*  CREATININE 1.09*  1.22* 1.23*  CALCIUM 8.4* 8.4* 8.6*   GFR: Estimated Creatinine Clearance: 60.1 mL/min (A) (by C-G formula based on SCr of 1.23 mg/dL (H)). Liver Function Tests: Recent Labs  Lab 06/04/20 0502  AST 14*  ALT 12  ALKPHOS 50  BILITOT 0.9  PROT 6.3*  ALBUMIN 3.0*   No results for input(s): LIPASE, AMYLASE in the last 168 hours. No results for input(s): AMMONIA in the last 168 hours. Coagulation Profile: No results for input(s): INR, PROTIME in the last 168 hours. Cardiac Enzymes: No results for input(s): CKTOTAL, CKMB, CKMBINDEX, TROPONINI in the last 168 hours. BNP (last 3 results) No results for input(s): PROBNP in the last 8760 hours. HbA1C: No results for input(s): HGBA1C in the last 72 hours. CBG: No results for input(s): GLUCAP in the last 168 hours. Lipid Profile: No results for input(s): CHOL, HDL, LDLCALC, TRIG, CHOLHDL, LDLDIRECT in the last 72 hours. Thyroid Function Tests: No results for input(s): TSH, T4TOTAL, FREET4, T3FREE, THYROIDAB in the last 72 hours. Anemia Panel: No results for input(s): VITAMINB12, FOLATE, FERRITIN, TIBC, IRON, RETICCTPCT in the last 72 hours. Urine analysis:    Component Value Date/Time   COLORURINE YELLOW 01/15/2013 1538   APPEARANCEUR CLEAR 01/15/2013 1538   LABSPEC 1.010 01/15/2013 1538   PHURINE 6.0 01/15/2013 1538   GLUCOSEU NEGATIVE 01/15/2013 1538   HGBUR MODERATE (A) 01/15/2013 1538   BILIRUBINUR neg 04/08/2016 0916   KETONESUR NEGATIVE 01/15/2013 1538   PROTEINUR Trace 04/08/2016 0916   PROTEINUR NEGATIVE 01/15/2013 1538   UROBILINOGEN negative 04/08/2016 0916   UROBILINOGEN 0.2 01/15/2013 1538   NITRITE neg 04/08/2016 0916   NITRITE NEGATIVE 01/15/2013 1538   LEUKOCYTESUR small (1+) (A) 04/08/2016 0916     Tykeshia Tourangeau M.D. [facility] 06/05/2020, 11:45  AM   Call night coverage person covering after 7pm

## 2020-06-06 DIAGNOSIS — E039 Hypothyroidism, unspecified: Secondary | ICD-10-CM | POA: Diagnosis not present

## 2020-06-06 DIAGNOSIS — J9601 Acute respiratory failure with hypoxia: Secondary | ICD-10-CM | POA: Diagnosis not present

## 2020-06-06 DIAGNOSIS — J189 Pneumonia, unspecified organism: Secondary | ICD-10-CM | POA: Diagnosis not present

## 2020-06-06 LAB — RESPIRATORY PANEL BY PCR

## 2020-06-06 LAB — BASIC METABOLIC PANEL
Anion gap: 10 (ref 5–15)
BUN: 35 mg/dL — ABNORMAL HIGH (ref 8–23)
CO2: 25 mmol/L (ref 22–32)
Calcium: 9.1 mg/dL (ref 8.9–10.3)
Chloride: 101 mmol/L (ref 98–111)
Creatinine, Ser: 1.13 mg/dL — ABNORMAL HIGH (ref 0.44–1.00)
GFR, Estimated: 50 mL/min — ABNORMAL LOW (ref >60)
Glucose, Bld: 137 mg/dL — ABNORMAL HIGH (ref 70–99)
Potassium: 4.2 mmol/L (ref 3.5–5.1)
Sodium: 136 mmol/L (ref 135–145)

## 2020-06-06 LAB — CBC
HCT: 33.2 % — ABNORMAL LOW (ref 36.0–46.0)
Hemoglobin: 10.5 g/dL — ABNORMAL LOW (ref 12.0–15.0)
MCH: 29 pg (ref 26.0–34.0)
MCHC: 31.6 g/dL (ref 30.0–36.0)
MCV: 91.7 fL (ref 80.0–100.0)
Platelets: 165 10*3/uL (ref 150–400)
RBC: 3.62 MIL/uL — ABNORMAL LOW (ref 3.87–5.11)
RDW: 15.9 % — ABNORMAL HIGH (ref 11.5–15.5)
WBC: 10.5 10*3/uL (ref 4.0–10.5)
nRBC: 0 % (ref 0.0–0.2)

## 2020-06-06 MED ORDER — ENOXAPARIN SODIUM 80 MG/0.8ML ~~LOC~~ SOLN
70.0000 mg | SUBCUTANEOUS | Status: DC
  Administered 2020-06-06 – 2020-06-07 (×2): 70 mg via SUBCUTANEOUS
  Filled 2020-06-06 (×2): qty 0.8

## 2020-06-06 NOTE — Progress Notes (Signed)
PROGRESS NOTE    Sally Wise  SVX:[id number] DOB: 1946/04/20 DOA: 06/03/2020 PCP: Daisy Floro, MD   Brief Narrative: Sally Wise is a 74 y.o. female with a history of atrial fibrillation, hypothyroidism, morbid obesity. Patient presented secondary to dyspnea and fatigue and found to have evidence of multifocal pneumonia in addition to pulmonary edema. Started on antibiotics and IV lasix with incremental improvement. Supplemental oxygen initiated for respiratory failure.   Assessment & Plan:   Principal Problem:   Acute respiratory failure with hypoxia (HCC) Active Problems:   Hypothyroidism   Morbid obesity (HCC)   PAF (paroxysmal atrial fibrillation) (HCC)   Multifocal pneumonia   Acute respiratory failure with hypoxia Multifactorial in setting of pneumonia and heart failure. Weaning oxygen as able. -Continue to wean oxygen; goal SpO2 >90% -Continue flutter valve and incentive spirometer   Multifocal pneumonia Atypical appearing. COVID, influenza negative. On Ceftriaxone and azithromycin for treatment with improvement. Currently not baseline. Still requiring oxygen -Continue Ceftriaxone and azithromycin -RVP  Acute on chronic diastolic heart failure Transthoracic Echocardiogram significant for an EF of 55% with grade 1 diastolic dysfunction. Treated initially with IV lasix with good output and transitioned to PO lasix. -Continue Lasix   Concern for acute cystitis Urinalysis was unremarkable for infection.   Hypothyroidism -Continue Synthroid  Paroxysmal atrial fibrillation -Continue diltiazem  Chronic thrombocytopenia Improved from baseline.  Morbid obesity Body mass index is 49.88 kg/m.   DVT prophylaxis: Lovenox Code Status:   Code Status: Full Code Family Communication: None at bedside Disposition Plan: Discharge home likely in 24 hours if closer to baseline   Consultants:   None  Procedures:   TRANSTHORACIC ECHOCARDIOGRAM  (11/22) IMPRESSIONS    1. Left ventricular ejection fraction, by estimation, is 55 to 60%. The  left ventricle has normal function. The left ventricle has no regional  wall motion abnormalities. Left ventricular diastolic parameters are  consistent with Grade I diastolic  dysfunction (impaired relaxation).  2. Right ventricular systolic function is normal. The right ventricular  size is normal. Tricuspid regurgitation signal is inadequate for assessing  PA pressure.  3. The mitral valve is grossly normal. Trivial mitral valve  regurgitation.  4. The aortic valve is tricuspid. Aortic valve regurgitation is not  visualized.  5. The inferior vena cava is dilated in size with <50% respiratory  variability, suggesting right atrial pressure of 15 mmHg.   Antimicrobials:  Ceftriaxone  Azithromycin    Subjective: Still with some dyspnea and coughing.   Objective: Vitals:   06/06/20 0914 06/06/20 1028 06/06/20 1256 06/06/20 1414  BP:  (!) 136/54 (!) 144/69   Pulse:  94 73   Resp:  20 18   Temp:  97.6 F (36.4 C) 97.8 F (36.6 C)   TempSrc:  Oral Oral   SpO2: 96% 96% 94% 93%  Weight:      Height:        Intake/Output Summary (Last 24 hours) at 06/06/2020 1649 Last data filed at 06/06/2020 1425 Gross per 24 hour  Intake 540 ml  Output 1700 ml  Net -1160 ml   Filed Weights   06/03/20 1722 06/05/20 0600 06/05/20 0900  Weight: (!) 160.5 kg (!) 149.2 kg (!) 148.8 kg    Examination:  General exam: Appears calm and comfortable Respiratory system: Some mild wheezing. Respiratory effort normal. Cardiovascular system: S1 & S2 heard, RRR. No murmurs, rubs, gallops or clicks. Gastrointestinal system: Abdomen is nondistended, soft and nontender. No organomegaly or masses felt. Normal  bowel sounds heard. Central nervous system: Alert and oriented. No focal neurological deficits. Musculoskeletal: 2+ pitting edema. No calf tenderness Skin: No cyanosis. No  rashes Psychiatry: Judgement and insight appear normal. Mood & affect appropriate.     Data Reviewed: I have personally reviewed following labs and imaging studies  CBC Lab Results  Component Value Date   WBC 10.5 06/06/2020   RBC 3.62 (L) 06/06/2020   HGB 10.5 (L) 06/06/2020   HCT 33.2 (L) 06/06/2020   MCV 91.7 06/06/2020   MCH 29.0 06/06/2020   PLT 165 06/06/2020   MCHC 31.6 06/06/2020   RDW 15.9 (H) 06/06/2020   LYMPHSABS 2.1 06/03/2020   MONOABS 5.2 (H) 06/03/2020   EOSABS 0.1 06/03/2020   BASOSABS 0.0 06/03/2020     Last metabolic panel Lab Results  Component Value Date   NA 136 06/06/2020   K 4.2 06/06/2020   CL 101 06/06/2020   CO2 25 06/06/2020   BUN 35 (H) 06/06/2020   CREATININE 1.13 (H) 06/06/2020   GLUCOSE 137 (H) 06/06/2020   GFRNONAA 50 (L) 06/06/2020   GFRAA 80 (L) 08/05/2013   CALCIUM 9.1 06/06/2020   PROT 6.3 (L) 06/04/2020   ALBUMIN 3.0 (L) 06/04/2020   BILITOT 0.9 06/04/2020   ALKPHOS 50 06/04/2020   AST 14 (L) 06/04/2020   ALT 12 06/04/2020   ANIONGAP 10 06/06/2020    CBG (last 3)  No results for input(s): GLUCAP in the last 72 hours.   GFR: Estimated Creatinine Clearance: 65.5 mL/min (A) (by C-G formula based on SCr of 1.13 mg/dL (H)).  Coagulation Profile: No results for input(s): INR, PROTIME in the last 168 hours.  Recent Results (from the past 240 hour(s))  Resp Panel by RT-PCR (Flu A&B, Covid) Nasopharyngeal Swab     Status: None   Collection Time: 06/03/20  1:30 PM   Specimen: Nasopharyngeal Swab; Nasopharyngeal(NP) swabs in vial transport medium  Result Value Ref Range Status   SARS Coronavirus 2 by RT PCR NEGATIVE NEGATIVE Final    Comment: (NOTE) SARS-CoV-2 target nucleic acids are NOT DETECTED.  The SARS-CoV-2 RNA is generally detectable in upper respiratory specimens during the acute phase of infection. The lowest concentration of SARS-CoV-2 viral copies this assay can detect is 138 copies/mL. A negative result does  not preclude SARS-Cov-2 infection and should not be used as the sole basis for treatment or other patient management decisions. A negative result may occur with  improper specimen collection/handling, submission of specimen other than nasopharyngeal swab, presence of viral mutation(s) within the areas targeted by this assay, and inadequate number of viral copies(<138 copies/mL). A negative result must be combined with clinical observations, patient history, and epidemiological information. The expected result is Negative.  Fact Sheet for Patients:  BloggerCourse.com  Fact Sheet for Healthcare Providers:  SeriousBroker.it  This test is no t yet approved or cleared by the [place] FDA and  has been authorized for detection and/or diagnosis of SARS-CoV-2 by FDA under an Emergency Use Authorization (EUA). This EUA will remain  in effect (meaning this test can be used) for the duration of the COVID-19 declaration under Section 564(b)(1) of the Act, 21 U.S.C.section 360bbb-3(b)(1), unless the authorization is terminated  or revoked sooner.       Influenza A by PCR NEGATIVE NEGATIVE Final   Influenza B by PCR NEGATIVE NEGATIVE Final    Comment: (NOTE) The Xpert Xpress SARS-CoV-2/FLU/RSV plus assay is intended as an aid in the diagnosis of influenza from Nasopharyngeal swab  specimens and should not be used as a sole basis for treatment. Nasal washings and aspirates are unacceptable for Xpert Xpress SARS-CoV-2/FLU/RSV testing.  Fact Sheet for Patients: BloggerCourse.comhttps://www.fda.gov/media/152166/download  Fact Sheet for Healthcare Providers: SeriousBroker.ithttps://www.fda.gov/media/152162/download  This test is not yet approved or cleared by the [place] FDA and has been authorized for detection and/or diagnosis of SARS-CoV-2 by FDA under an Emergency Use Authorization (EUA). This EUA will remain in effect (meaning this test can be used) for the  duration of the COVID-19 declaration under Section 564(b)(1) of the Act, 21 U.S.C. section 360bbb-3(b)(1), unless the authorization is terminated or revoked.  Performed at [facility], [address] [place], [place] [address]   Culture, blood (routine x 2)     Status: None (Preliminary result)   Collection Time: 06/04/20 10:05 AM   Specimen: BLOOD  Result Value Ref Range Status   Specimen Description   Final    BLOOD LEFT ANTECUBITAL Performed at [facility], [address] [place], [place] [address]    Special Requests   Final    BOTTLES DRAWN AEROBIC AND ANAEROBIC BACTEROIDES CACCAE Performed at [facility], [address] [place], [place] [address]    Culture   Final    NO GROWTH 2 DAYS Performed at [facility], [address] [place], [place] [address]    Report Status PENDING  Incomplete  Culture, blood (routine x 2)     Status: None (Preliminary result)   Collection Time: 06/04/20 10:11 AM   Specimen: BLOOD LEFT HAND  Result Value Ref Range Status   Specimen Description   Final    BLOOD LEFT HAND Performed at [facility], [address] [place], [place] [address]    Special Requests   Final    BOTTLES DRAWN AEROBIC ONLY Blood Culture adequate volume Performed at [facility], [address] [place], [place] [address]    Culture   Final    NO GROWTH 2 DAYS Performed at [facility], [address] [place], [place] [address]    Report Status PENDING  Incomplete  Respiratory Panel by PCR     Status: None   Collection Time: 06/06/20 12:09 PM   Specimen: Nasopharyngeal Swab; Respiratory  Result Value Ref Range Status   Adenovirus NOT DETECTED NOT DETECTED Final   Coronavirus 229E NOT DETECTED NOT DETECTED Final    Comment: (NOTE) The Coronavirus on the Respiratory Panel, DOES NOT test for the novel  Coronavirus (2019 nCoV)    Coronavirus HKU1 NOT  DETECTED NOT DETECTED Final   Coronavirus NL63 NOT DETECTED NOT DETECTED Final   Coronavirus OC43 NOT DETECTED NOT DETECTED Final   Metapneumovirus NOT DETECTED NOT DETECTED Final   Rhinovirus / Enterovirus NOT DETECTED NOT DETECTED Final   Influenza A NOT DETECTED NOT DETECTED Final   Influenza B NOT DETECTED NOT DETECTED Final   Parainfluenza Virus 1 NOT DETECTED NOT DETECTED Final   Parainfluenza Virus 2 NOT DETECTED NOT DETECTED Final   Parainfluenza Virus 3 NOT DETECTED NOT DETECTED Final   Parainfluenza Virus 4 NOT DETECTED NOT DETECTED Final   Respiratory Syncytial Virus NOT DETECTED NOT DETECTED Final   Bordetella pertussis NOT DETECTED NOT DETECTED Final   Chlamydophila pneumoniae NOT DETECTED NOT DETECTED Final   Mycoplasma pneumoniae NOT DETECTED NOT DETECTED Final    Comment: Performed at [facility], [address] [place], [place] [address]  Radiology Studies: DG CHEST PORT 1 VIEW  Result Date: 06/05/2020 CLINICAL DATA:  Shortness of breath. EXAM: PORTABLE CHEST 1 VIEW COMPARISON:  06/03/2020. FINDINGS: Cardiomegaly. Interim partial resolution of bilateral pulmonary infiltrates/edema. Persistent bilateral interstitial prominence. Tiny bilateral pleural effusions cannot be excluded. Stable right lateral pleural thickening again noted. No pneumothorax. Degenerative changes scoliosis thoracic spine. IMPRESSION: Cardiomegaly. Interim partial resolution of bilateral pulmonary infiltrates/edema. Persistent bilateral interstitial prominence. Tiny bilateral pleural effusions cannot be excluded. Electronically Signed   By: Maisie Fus  Register   On: 06/05/2020 13:40        Scheduled Meds: . arformoterol  15 mcg Nebulization BID  . azithromycin  500 mg Oral Daily  . benzonatate  100 mg Oral TID  . budesonide (PULMICORT) nebulizer solution  0.25 mg Nebulization BID  . cholecalciferol  5,000 Units Oral Daily  . diltiazem  180 mg Oral Daily  . enoxaparin (LOVENOX)  injection  70 mg Subcutaneous Q24H  . guaiFENesin  600 mg Oral BID  . ipratropium-albuterol  3 mL Nebulization TID  . levothyroxine  112 mcg Oral Q0600  . methylPREDNISolone (SOLU-MEDROL) injection  40 mg Intravenous Q12H  . Urelle  1 tablet Oral TID   Continuous Infusions: . cefTRIAXone (ROCEPHIN)  IV 2 g (06/06/20 1634)     LOS: 2 days     Jacquelin Hawking, MD [facility] 06/06/2020, 4:49 PM  If 7PM-7AM, please contact night-coverage [email or web address]

## 2020-06-06 NOTE — Evaluation (Signed)
Occupational Therapy Evaluation Patient Details Name: Sally Wise MRN: [id number] DOB: 14-Dec-1945 Today's Date: 06/06/2020    History of Present Illness Patient is 74 y.o. female with PMH significant for PAF, CHF, hypothyroidism, obesity, bil TKA. Patient present to [facility] with SOB and fatigue, found ot be hypoxic in ED. Covid and Flu negative.   Clinical Impression   Patient lives at home with spouse + child, reports mod I at baseline with use of cane in community. Currently patient is limited by decreased activity tolerance, global weakness, and SOB with minimal exertion desaturating to mid 80s on room air with chair transfer. Patient required min A for transfer due to decreased stability and declined use of rolling walker. Educate patient on importance of daily activity such as getting up to [facility] as well as using IS throughout the day to aid in recover from PNA. Recommend continued acute OT services in order to maximize patient safety and independence with self care in order to D/C to venue listed below.    Follow Up Recommendations  Home health OT;Supervision - Intermittent    Equipment Recommendations  Other (comment);Toilet riser (patient may refuse)       Precautions / Restrictions Precautions Precautions: Fall Precaution Comments: watch O2 sats, pt reports about 3 falls at home in last 6 months Restrictions Weight Bearing Restrictions: No      Mobility Bed Mobility Overal bed mobility: Needs Assistance Bed Mobility: Supine to Sit     Supine to sit: Min guard;HOB elevated     General bed mobility comments: pt taking extra time, using bed rail. guarding for safety.     Transfers Overall transfer level: Needs assistance Equipment used: 1 person hand held assist Transfers: Sit to/from [employer] Sit to Stand: Min assist;From elevated surface Stand pivot transfers: Min assist       General transfer comment: please see toilet transfer section in  ADLs    Balance Overall balance assessment: Needs assistance Sitting-balance support: Feet supported Sitting balance-Leahy Scale: Good     Standing balance support: Single extremity supported;Bilateral upper extremity supported;During functional activity Standing balance-Leahy Scale: Poor Standing balance comment: reliant on external support, declined walker however reaching out for furniture                           ADL either performed or assessed with clinical judgement   ADL Overall ADL's : Needs assistance/impaired Eating/Feeding: Independent;Sitting   Grooming: Set up;Sitting   Upper Body Bathing: Set up;Sitting   Lower Body Bathing: Minimal assistance;Sitting/lateral leans   Upper Body Dressing : Set up;Sitting   Lower Body Dressing: Minimal assistance;Sitting/lateral leans Lower Body Dressing Details (indicate cue type and reason): patient require assist to initiate donning mesh underwear "the purewick is uncomfortable" Toilet Transfer: Minimal assistance;Stand-pivot Toilet Transfer Details (indicate cue type and reason): without AD recommended to patient use of walker for next transfer as patient was unsteady, reaching out for furniture to stabilize with patient reporting feeling weak  Toileting- Clothing Manipulation and Hygiene: Moderate assistance;Sitting/lateral lean;Sit to/from stand       Functional mobility during ADLs: Minimal assistance General ADL Comments: patient requiring increased assistance with self care due to weakness, decreased activity tolerance. Patient reports difficulty with getting up from chairs especially without arms. Recommended firm chair cushion to bring with her/place in chairs at home. Also recommended toilet riser or 3 in 1 over toilet however patient stated "I don't want that"  Pertinent Vitals/Pain Pain Assessment: No/denies pain     Hand Dominance Right   Extremity/Trunk Assessment Upper  Extremity Assessment Upper Extremity Assessment: Generalized weakness   Lower Extremity Assessment Lower Extremity Assessment: Defer to PT evaluation   Cervical / Trunk Assessment Cervical / Trunk Assessment: Other exceptions Cervical / Trunk Exceptions: body habitus   Communication Communication Communication: No difficulties   Cognition Arousal/Alertness: Awake/alert Behavior During Therapy: WFL for tasks assessed/performed Overall Cognitive Status: Within Functional Limits for tasks assessed                                     General Comments  desats to mid 80s with exertion on room air, cue in PLB and donned 2L once seated in chair with return to low 90s. also educate patient on importance of IS in addition to flutter valve            Home Living Family/patient expects to be discharged to:: Private residence Living Arrangements: Spouse/significant other;Children Available Help at Discharge: Family Type of Home: House Home Access: Stairs to enter Secretary/administrator of Steps: 3 Entrance Stairs-Rails: Right Home Layout: One level     Bathroom Shower/Tub: Producer, television/film/video: Handicapped height Bathroom Accessibility: Yes How Accessible: Accessible via walker Home Equipment: Cane - single point;Grab bars - tub/shower   Additional Comments: reports HOB is adjustable      Prior Functioning/Environment Level of Independence: Independent        Comments: pt uses SPC for safety/security with community mobility, does not need device in home.         OT Problem List: Decreased strength;Decreased activity tolerance;Impaired balance (sitting and/or standing);Decreased safety awareness;Decreased knowledge of use of DME or AE;Obesity      OT Treatment/Interventions: Self-care/ADL training;Therapeutic exercise;DME and/or AE instruction;Therapeutic activities;Patient/family education;Balance training    OT Goals(Current goals can be  found in the care plan section) Acute Rehab OT Goals Patient Stated Goal: regain strength, balance, and endurance OT Goal Formulation: With patient Time For Goal Achievement: 06/20/20 Potential to Achieve Goals: Good  OT Frequency: Min 2X/week    AM-PAC OT "6 Clicks" Daily Activity     Outcome Measure Help from another person eating meals?: None Help from another person taking care of personal grooming?: A Little Help from another person toileting, which includes using toliet, bedpan, or urinal?: A Lot Help from another person bathing (including washing, rinsing, drying)?: A Little Help from another person to put on and taking off regular upper body clothing?: A Little Help from another person to put on and taking off regular lower body clothing?: A Little 6 Click Score: 18   End of Session Equipment Utilized During Treatment: Oxygen Nurse Communication: Mobility status  Activity Tolerance: Patient limited by fatigue Patient left: in chair;with call bell/phone within reach  OT Visit Diagnosis: Other abnormalities of gait and mobility (R26.89);Unsteadiness on feet (R26.81);Muscle weakness (generalized) (M62.81)                Time: 1610-9604 OT Time Calculation (min): 25 min Charges:  OT General Charges $OT Visit: 1 Visit OT Evaluation $OT Eval Low Complexity: 1 Low OT Treatments $Self Care/Home Management : 8-22 mins  Marlyce Huge OT OT pager: [phone]  Carmelia Roller 06/06/2020, 9:11 AM

## 2020-06-07 DIAGNOSIS — E039 Hypothyroidism, unspecified: Secondary | ICD-10-CM | POA: Diagnosis not present

## 2020-06-07 DIAGNOSIS — J9601 Acute respiratory failure with hypoxia: Secondary | ICD-10-CM | POA: Diagnosis not present

## 2020-06-07 DIAGNOSIS — J189 Pneumonia, unspecified organism: Secondary | ICD-10-CM | POA: Diagnosis not present

## 2020-06-07 LAB — LEGIONELLA PNEUMOPHILA SEROGP 1 UR AG: L. pneumophila Serogp 1 Ur Ag: NEGATIVE

## 2020-06-07 LAB — URINE CULTURE: Culture: NO GROWTH

## 2020-06-07 NOTE — Progress Notes (Signed)
PROGRESS NOTE    Sally Wise  ZOX:[id number] DOB: 11/12/1945 DOA: 06/03/2020 PCP: Daisy Florooss, Charles Alan, MD   Brief Narrative: Sally Wise is a 74 y.o. female with a history of atrial fibrillation, hypothyroidism, morbid obesity. Patient presented secondary to dyspnea and fatigue and found to have evidence of multifocal pneumonia in addition to pulmonary edema. Started on antibiotics and IV lasix with incremental improvement. Supplemental oxygen initiated for respiratory failure.   Assessment & Plan:   Principal Problem:   Acute respiratory failure with hypoxia (HCC) Active Problems:   Hypothyroidism   Morbid obesity (HCC)   PAF (paroxysmal atrial fibrillation) (HCC)   Multifocal pneumonia   Acute respiratory failure with hypoxia Multifactorial in setting of pneumonia and heart failure. Weaning oxygen as able. -Continue to wean oxygen; goal SpO2 >90% -Ambulate with pulse ox -Continue flutter valve and incentive spirometer   Multifocal pneumonia Atypical appearing. COVID, influenza negative. On Ceftriaxone and azithromycin for treatment with improvement. Currently not baseline. Still requiring oxygen. RVP negative. Azithromycin course completed. -Continue Ceftriaxone -flutter valve -Continue albuterol nebulizer  Acute on chronic diastolic heart failure Transthoracic Echocardiogram significant for an EF of 55% with grade 1 diastolic dysfunction. Treated initially with IV lasix with good output and transitioned to PO lasix. -Continue Lasix PO  Concern for acute cystitis Urinalysis was unremarkable for infection.   Hypothyroidism -Continue Synthroid  Paroxysmal atrial fibrillation -Continue diltiazem  Chronic thrombocytopenia Improved from baseline.  Morbid obesity Body mass index is 50.45 kg/m.   DVT prophylaxis: Lovenox Code Status:   Code Status: Full Code Family Communication: None at bedside Disposition Plan: Discharge home in 24 hours pending  weaning off oxygen if able and pending updated PT recommendations   Consultants:   None  Procedures:   TRANSTHORACIC ECHOCARDIOGRAM (11/22) IMPRESSIONS    1. Left ventricular ejection fraction, by estimation, is 55 to 60%. The  left ventricle has normal function. The left ventricle has no regional  wall motion abnormalities. Left ventricular diastolic parameters are  consistent with Grade I diastolic  dysfunction (impaired relaxation).  2. Right ventricular systolic function is normal. The right ventricular  size is normal. Tricuspid regurgitation signal is inadequate for assessing  PA pressure.  3. The mitral valve is grossly normal. Trivial mitral valve  regurgitation.  4. The aortic valve is tricuspid. Aortic valve regurgitation is not  visualized.  5. The inferior vena cava is dilated in size with <50% respiratory  variability, suggesting right atrial pressure of 15 mmHg.   Antimicrobials:  Ceftriaxone  Azithromycin    Subjective: Dyspnea has improved. Feels better than admission. Multiple questions by patient today that were answered adequately. She has not been up and ambulating but has been to the chair. Still with a cough and wheezing.  Objective: Vitals:   06/07/20 0301 06/07/20 0435 06/07/20 0458 06/07/20 0735  BP:   129/73   Pulse:   72   Resp:   18   Temp:   98.2 F (36.8 C)   TempSrc:      SpO2: 94%  96% 95%  Weight:  (!) 150.5 kg    Height:        Intake/Output Summary (Last 24 hours) at 06/07/2020 1308 Last data filed at 06/07/2020 1000 Gross per 24 hour  Intake 580 ml  Output 1900 ml  Net -1320 ml   Filed Weights   06/05/20 0600 06/05/20 0900 06/07/20 0435  Weight: (!) 149.2 kg (!) 148.8 kg (!) 150.5 kg    Examination:  General exam: Appears calm and comfortable  Respiratory system: Mild wheezing diffusely. Respiratory effort normal. Cardiovascular system: S1 & S2 heard, RRR. No murmurs, rubs, gallops or  clicks. Gastrointestinal system: Abdomen is nondistended, soft and nontender. No organomegaly or masses felt. Normal bowel sounds heard. Central nervous system: Alert and oriented. No focal neurological deficits. Musculoskeletal: No calf tenderness Skin: No cyanosis. No rashes Psychiatry: Judgement and insight appear normal. Mood & affect appropriate.     Data Reviewed: I have personally reviewed following labs and imaging studies  CBC Lab Results  Component Value Date   WBC 10.5 06/06/2020   RBC 3.62 (L) 06/06/2020   HGB 10.5 (L) 06/06/2020   HCT 33.2 (L) 06/06/2020   MCV 91.7 06/06/2020   MCH 29.0 06/06/2020   PLT 165 06/06/2020   MCHC 31.6 06/06/2020   RDW 15.9 (H) 06/06/2020   LYMPHSABS 2.1 06/03/2020   MONOABS 5.2 (H) 06/03/2020   EOSABS 0.1 06/03/2020   BASOSABS 0.0 06/03/2020     Last metabolic panel Lab Results  Component Value Date   NA 136 06/06/2020   K 4.2 06/06/2020   CL 101 06/06/2020   CO2 25 06/06/2020   BUN 35 (H) 06/06/2020   CREATININE 1.13 (H) 06/06/2020   GLUCOSE 137 (H) 06/06/2020   GFRNONAA 50 (L) 06/06/2020   GFRAA 80 (L) 08/05/2013   CALCIUM 9.1 06/06/2020   PROT 6.3 (L) 06/04/2020   ALBUMIN 3.0 (L) 06/04/2020   BILITOT 0.9 06/04/2020   ALKPHOS 50 06/04/2020   AST 14 (L) 06/04/2020   ALT 12 06/04/2020   ANIONGAP 10 06/06/2020    CBG (last 3)  No results for input(s): GLUCAP in the last 72 hours.   GFR: Estimated Creatinine Clearance: 65.9 mL/min (A) (by C-G formula based on SCr of 1.13 mg/dL (H)).  Coagulation Profile: No results for input(s): INR, PROTIME in the last 168 hours.  Recent Results (from the past 240 hour(s))  Resp Panel by RT-PCR (Flu A&B, Covid) Nasopharyngeal Swab     Status: None   Collection Time: 06/03/20  1:30 PM   Specimen: Nasopharyngeal Swab; Nasopharyngeal(NP) swabs in vial transport medium  Result Value Ref Range Status   SARS Coronavirus 2 by RT PCR NEGATIVE NEGATIVE Final    Comment:  (NOTE) SARS-CoV-2 target nucleic acids are NOT DETECTED.  The SARS-CoV-2 RNA is generally detectable in upper respiratory specimens during the acute phase of infection. The lowest concentration of SARS-CoV-2 viral copies this assay can detect is 138 copies/mL. A negative result does not preclude SARS-Cov-2 infection and should not be used as the sole basis for treatment or other patient management decisions. A negative result may occur with  improper specimen collection/handling, submission of specimen other than nasopharyngeal swab, presence of viral mutation(s) within the areas targeted by this assay, and inadequate number of viral copies(<138 copies/mL). A negative result must be combined with clinical observations, patient history, and epidemiological information. The expected result is Negative.  Fact Sheet for Patients:  BloggerCourse.com  Fact Sheet for Healthcare Providers:  SeriousBroker.it  This test is no t yet approved or cleared by the [place] FDA and  has been authorized for detection and/or diagnosis of SARS-CoV-2 by FDA under an Emergency Use Authorization (EUA). This EUA will remain  in effect (meaning this test can be used) for the duration of the COVID-19 declaration under Section 564(b)(1) of the Act, 21 U.S.C.section 360bbb-3(b)(1), unless the authorization is terminated  or revoked sooner.       Influenza  A by PCR NEGATIVE NEGATIVE Final   Influenza B by PCR NEGATIVE NEGATIVE Final    Comment: (NOTE) The Xpert Xpress SARS-CoV-2/FLU/RSV plus assay is intended as an aid in the diagnosis of influenza from Nasopharyngeal swab specimens and should not be used as a sole basis for treatment. Nasal washings and aspirates are unacceptable for Xpert Xpress SARS-CoV-2/FLU/RSV testing.  Fact Sheet for Patients: BloggerCourse.com  Fact Sheet for Healthcare  Providers: SeriousBroker.it  This test is not yet approved or cleared by the [place] FDA and has been authorized for detection and/or diagnosis of SARS-CoV-2 by FDA under an Emergency Use Authorization (EUA). This EUA will remain in effect (meaning this test can be used) for the duration of the COVID-19 declaration under Section 564(b)(1) of the Act, 21 U.S.C. section 360bbb-3(b)(1), unless the authorization is terminated or revoked.  Performed at [facility], [address], [place], [place] [address]   Culture, blood (routine x 2)     Status: None (Preliminary result)   Collection Time: 06/04/20 10:05 AM   Specimen: BLOOD  Result Value Ref Range Status   Specimen Description   Final    BLOOD LEFT ANTECUBITAL Performed at [facility], [address], [place], [place] [address]    Special Requests   Final    BOTTLES DRAWN AEROBIC AND ANAEROBIC BACTEROIDES CACCAE Performed at [facility], [address] [place], [place] [address]    Culture   Final    NO GROWTH 3 DAYS Performed at [facility], [address] [place], [place] [address]    Report Status PENDING  Incomplete  Culture, blood (routine x 2)     Status: None (Preliminary result)   Collection Time: 06/04/20 10:11 AM   Specimen: BLOOD LEFT HAND  Result Value Ref Range Status   Specimen Description   Final    BLOOD LEFT HAND Performed at [facility], [address] [place], [place] [address]    Special Requests   Final    BOTTLES DRAWN AEROBIC ONLY Blood Culture adequate volume Performed at [facility], [address], [place], [place] [address]    Culture   Final    NO GROWTH 3 DAYS Performed at [facility], [address] [place], [place] [address]    Report Status PENDING  Incomplete  Urine Culture     Status: None   Collection Time: 06/05/20  6:00 PM   Specimen:  Urine, Clean Catch  Result Value Ref Range Status   Specimen Description   Final    URINE, CLEAN CATCH Performed at [facility], [address] [place], [place] [address]    Special Requests   Final    NONE Performed at [facility], [address], [place], [place] [address]    Culture   Final    NO GROWTH Performed at [facility], [address] [place], [place] [address]    Report Status 06/07/2020 FINAL  Final  Respiratory Panel by PCR     Status: None   Collection Time: 06/06/20 12:09 PM   Specimen: Nasopharyngeal Swab; Respiratory  Result Value Ref Range Status   Adenovirus NOT DETECTED NOT DETECTED Final   Coronavirus 229E NOT DETECTED NOT DETECTED Final    Comment: (NOTE) The Coronavirus on the Respiratory Panel, DOES NOT test for the novel  Coronavirus (2019 nCoV)    Coronavirus HKU1 NOT DETECTED NOT DETECTED  Final   Coronavirus NL63 NOT DETECTED NOT DETECTED Final   Coronavirus OC43 NOT DETECTED NOT DETECTED Final   Metapneumovirus NOT DETECTED NOT DETECTED Final   Rhinovirus / Enterovirus NOT DETECTED NOT DETECTED Final   Influenza A NOT DETECTED NOT DETECTED Final   Influenza B NOT DETECTED NOT DETECTED Final   Parainfluenza Virus 1 NOT DETECTED NOT DETECTED Final   Parainfluenza Virus 2 NOT DETECTED NOT DETECTED Final   Parainfluenza Virus 3 NOT DETECTED NOT DETECTED Final   Parainfluenza Virus 4 NOT DETECTED NOT DETECTED Final   Respiratory Syncytial Virus NOT DETECTED NOT DETECTED Final   Bordetella pertussis NOT DETECTED NOT DETECTED Final   Chlamydophila pneumoniae NOT DETECTED NOT DETECTED Final   Mycoplasma pneumoniae NOT DETECTED NOT DETECTED Final    Comment: Performed at [facility], [address], [place], [place] [address]        Radiology Studies: DG CHEST PORT 1 VIEW  Result Date: 06/05/2020 CLINICAL DATA:  Shortness of breath. EXAM: PORTABLE CHEST 1 VIEW COMPARISON:  06/03/2020.  FINDINGS: Cardiomegaly. Interim partial resolution of bilateral pulmonary infiltrates/edema. Persistent bilateral interstitial prominence. Tiny bilateral pleural effusions cannot be excluded. Stable right lateral pleural thickening again noted. No pneumothorax. Degenerative changes scoliosis thoracic spine. IMPRESSION: Cardiomegaly. Interim partial resolution of bilateral pulmonary infiltrates/edema. Persistent bilateral interstitial prominence. Tiny bilateral pleural effusions cannot be excluded. Electronically Signed   By: Maisie Fus  Register   On: 06/05/2020 13:40        Scheduled Meds: . arformoterol  15 mcg Nebulization BID  . azithromycin  500 mg Oral Daily  . benzonatate  100 mg Oral TID  . budesonide (PULMICORT) nebulizer solution  0.25 mg Nebulization BID  . cholecalciferol  5,000 Units Oral Daily  . diltiazem  180 mg Oral Daily  . enoxaparin (LOVENOX) injection  70 mg Subcutaneous Q24H  . guaiFENesin  600 mg Oral BID  . ipratropium-albuterol  3 mL Nebulization TID  . levothyroxine  112 mcg Oral Q0600  . methylPREDNISolone (SOLU-MEDROL) injection  40 mg Intravenous Q12H   Continuous Infusions: . cefTRIAXone (ROCEPHIN)  IV 2 g (06/06/20 1634)     LOS: 3 days     Jacquelin Hawking, MD [facility] 06/07/2020, 1:08 PM  If 7PM-7AM, please contact night-coverage [email or web address]

## 2020-06-08 DIAGNOSIS — J9601 Acute respiratory failure with hypoxia: Secondary | ICD-10-CM | POA: Diagnosis not present

## 2020-06-08 MED ORDER — GUAIFENESIN ER 600 MG PO TB12: 600.0000 mg | ORAL_TABLET | Freq: Two times a day (BID) | ORAL | 0 refills | Status: AC

## 2020-06-08 MED ORDER — FUROSEMIDE 20 MG PO TABS: 20.0000 mg | ORAL_TABLET | Freq: Every day | ORAL | 0 refills | Status: DC

## 2020-06-08 MED ORDER — BENZONATATE 100 MG PO CAPS
100.0000 mg | ORAL_CAPSULE | Freq: Three times a day (TID) | ORAL | 0 refills | Status: DC | PRN
Start: 2020-06-08 — End: 2020-07-27

## 2020-06-08 MED ORDER — CEFDINIR 300 MG PO CAPS: 300.0000 mg | ORAL_CAPSULE | Freq: Two times a day (BID) | ORAL | 0 refills | Status: AC

## 2020-06-08 MED ORDER — ALBUTEROL SULFATE HFA 108 (90 BASE) MCG/ACT IN AERS: 2.0000 | INHALATION_SPRAY | Freq: Four times a day (QID) | RESPIRATORY_TRACT | 1 refills | Status: DC | PRN

## 2020-06-08 MED ORDER — PREDNISONE 10 MG PO TABS: ORAL_TABLET | ORAL | 0 refills | Status: AC

## 2020-06-08 NOTE — Discharge Summary (Signed)
Physician Discharge Summary  Sally Wise KZS:[id number] DOB: 1946-01-25 DOA: 06/03/2020  PCP: Daisy Floro, MD  Admit date: 06/03/2020 Discharge date: 06/08/2020  Admitted From: Home Disposition: Home  Recommendations for Outpatient Follow-up:  1. Follow up with PCP in 1 week 2. Please obtain BMP/CBC in one week 3. Repeat chest x-ray in 2-3 weeks 4. Please follow up on the following pending results: None  Home Health: PT, OT Equipment/Devices: Rolling walker  Discharge Condition: Stable CODE STATUS: Full code Diet recommendation: Heart healthy   Brief/Interim Summary:  Admission HPI written by Teddy Spike, DO   Chief Complaint: dyspnea  HPI: Sally Wise is a 74 y.o. female with medical history significant of afib, hypothyroidism. Presenting with cough, dyspnea. Reports that she's had a non-productive cough for the last week. She has tried OTC cough meds but they have not helped. It has been accompanied by fatigue and poor appetitie. She had a flu and COVID test on Thursday that was negative. Her symptoms have worsen so that now she feels as if she can't catch her breath. She became concerned today and came to the ED.    1 week ago cough, fatigue, poor appetite, nausea. Tried OTC cough meds. Didn't help. APAP didn't help. Non-productive. Temp 102 F last night. Chills.   Took COVID and Flu test Thursday. Both negative.   Hospital course:  Acute respiratory failure with hypoxia Multifactorial in setting of pneumonia and heart failure. Patient weaned to room air prior to discharge. Resolved.  Multifocal pneumonia Atypical appearing. COVID, influenza negative. On Ceftriaxone and azithromycin for treatment with improvement. Currently not baseline. Still requiring oxygen. RVP negative. Azithromycin course completed. Transition to Cefdinir on discharge to complete 7 days. Discharge with incentive spirometer, flutter valve and albuterol HFA prn.  Acute  on chronic diastolic heart failure Transthoracic Echocardiogram significant for an EF of 55% with grade 1 diastolic dysfunction. Treated initially with IV lasix with good output and transitioned to PO lasix. Lasix on discharge for 3 days.  Concern for acute cystitis Urinalysis was unremarkable for infection. No evidence of UTI.  Hypothyroidism Continue Synthroid  Paroxysmal atrial fibrillation Continue diltiazem  Chronic thrombocytopenia Improved from baseline.  Morbid obesity Body mass index is 50.45 kg/m.  Discharge Diagnoses:  Principal Problem:   Acute respiratory failure with hypoxia (HCC) Active Problems:   Hypothyroidism   Morbid obesity (HCC)   PAF (paroxysmal atrial fibrillation) (HCC)   Multifocal pneumonia    Discharge Instructions  Discharge Instructions    Call MD for:  difficulty breathing, headache or visual disturbances   Complete by: As directed      Allergies as of 06/08/2020   No Known Allergies     Medication List    STOP taking these medications   clobetasol ointment 0.05 % Commonly known as: TEMOVATE     TAKE these medications   acetaminophen 500 MG tablet Commonly known as: TYLENOL Take 1,000 mg by mouth every 6 (six) hours as needed for mild pain or fever.   albuterol 108 (90 Base) MCG/ACT inhaler Commonly known as: VENTOLIN HFA Inhale 2 puffs into the lungs every 6 (six) hours as needed for wheezing or shortness of breath.   benzonatate 100 MG capsule Commonly known as: TESSALON Take 1 capsule (100 mg total) by mouth 3 (three) times daily as needed for cough.   cefdinir 300 MG capsule Commonly known as: OMNICEF Take 1 capsule (300 mg total) by mouth 2 (two) times daily for 2  days.   diltiazem 180 MG 24 hr capsule Commonly known as: CARDIZEM CD TAKE 1 CAPSULE BY MOUTH EVERY DAY   flecainide 150 MG tablet Commonly known as: TAMBOCOR Take 2 tablets (300 mg total) by mouth daily as needed (AFib episode).   furosemide  20 MG tablet Commonly known as: Lasix Take 1 tablet (20 mg total) by mouth daily for 3 days.   guaiFENesin 600 MG 12 hr tablet Commonly known as: MUCINEX Take 1 tablet (600 mg total) by mouth 2 (two) times daily for 5 days.   levothyroxine 112 MCG tablet Commonly known as: SYNTHROID Take 112 mcg by mouth daily before breakfast.   predniSONE 10 MG tablet Commonly known as: DELTASONE Take 3 tablets (30 mg total) by mouth daily with breakfast for 2 days, THEN 2 tablets (20 mg total) daily with breakfast for 2 days, THEN 1 tablet (10 mg total) daily with breakfast for 2 days. Start taking on: June 08, 2020   Vitamin D3 125 MCG (5000 UT) Tabs Take 1 tablet by mouth daily.            Durable Medical Equipment  (From admission, onward)         Start     Ordered   06/08/20 0959  For home use only DME Walker rolling  Once       Question Answer Comment  Walker: With 5 Inch Wheels   Patient needs a walker to treat with the following condition Pneumonia      06/08/20 0958          Follow-up Information    Daisy Floro, MD. Schedule an appointment as soon as possible for a visit in 1 week(s).   Specialty: Family Medicine Why: Hospital follow-up Contact information: [address] [place] [address] [phone]              No Known Allergies  Consultations:  None   Procedures/Studies: DG Chest 2 View  Result Date: 06/03/2020 CLINICAL DATA:  Short of breath, fatigue EXAM: CHEST - 2 VIEW COMPARISON:  CT chest 04/23/2020 FINDINGS: Stable enlarged cardiac silhouette. There is new perihilar airspace disease. No pleural fluid. No pneumothorax. No acute osseous abnormality. IMPRESSION: Perihilar airspace disease suggest multifocal pneumonia (including COVID pneumonia) versus pulmonary edema. Cardiomegaly. Electronically Signed   By: Genevive Bi M.D.   On: 06/03/2020 13:26   DG CHEST PORT 1 VIEW  Result Date: 06/05/2020 CLINICAL DATA:   Shortness of breath. EXAM: PORTABLE CHEST 1 VIEW COMPARISON:  06/03/2020. FINDINGS: Cardiomegaly. Interim partial resolution of bilateral pulmonary infiltrates/edema. Persistent bilateral interstitial prominence. Tiny bilateral pleural effusions cannot be excluded. Stable right lateral pleural thickening again noted. No pneumothorax. Degenerative changes scoliosis thoracic spine. IMPRESSION: Cardiomegaly. Interim partial resolution of bilateral pulmonary infiltrates/edema. Persistent bilateral interstitial prominence. Tiny bilateral pleural effusions cannot be excluded. Electronically Signed   By: Maisie Fus  Register   On: 06/05/2020 13:40   ECHOCARDIOGRAM COMPLETE  Result Date: 06/04/2020    ECHOCARDIOGRAM REPORT   Patient Name:   Sally Wise [facility] Date of Exam: 06/04/2020 Medical Rec #:  [id number]        Height:       68.0 in Accession #:    [id number]       Weight:       353.8 lb Date of Birth:  1946-01-11         BSA:          2.605 m Patient Age:    74 years  BP:           103/75 mmHg Patient Gender: F                HR:           68 bpm. Exam Location:  Inpatient Procedure: 2D Echo, Color Doppler and Cardiac Doppler Indications:    I50.31 Acute diastolic (congestive) heart failure  History:        Patient has prior history of Echocardiogram examinations, most                 recent 07/12/2014. Arrythmias:Atrial Fibrillation.  Sonographer:    Irving Burton Senior RDCS Referring Phys: [id number] RIPUDEEP K RAI  Sonographer Comments: Technically difficult study due to poor echo windows. Technically difficult due to morbid obesity and lung interference. IMPRESSIONS  1. Left ventricular ejection fraction, by estimation, is 55 to 60%. The left ventricle has normal function. The left ventricle has no regional wall motion abnormalities. Left ventricular diastolic parameters are consistent with Grade I diastolic dysfunction (impaired relaxation).  2. Right ventricular systolic function is normal. The right ventricular size is  normal. Tricuspid regurgitation signal is inadequate for assessing PA pressure.  3. The mitral valve is grossly normal. Trivial mitral valve regurgitation.  4. The aortic valve is tricuspid. Aortic valve regurgitation is not visualized.  5. The inferior vena cava is dilated in size with <50% respiratory variability, suggesting right atrial pressure of 15 mmHg. Comparison(s): Prior images unable to be directly viewed, comparison made by report only. Changes from prior study are noted. 07/12/2014: LVEF 50-55%, grade 1 DD. FINDINGS  Left Ventricle: Left ventricular ejection fraction, by estimation, is 55 to 60%. The left ventricle has normal function. The left ventricle has no regional wall motion abnormalities. The left ventricular internal cavity size was normal in size. There is  no left ventricular hypertrophy. Left ventricular diastolic parameters are consistent with Grade I diastolic dysfunction (impaired relaxation). Indeterminate filling pressures. Right Ventricle: The right ventricular size is normal. No increase in right ventricular wall thickness. Right ventricular systolic function is normal. Tricuspid regurgitation signal is inadequate for assessing PA pressure. Left Atrium: Left atrial size was normal in size. Right Atrium: Right atrial size was normal in size. Pericardium: There is no evidence of pericardial effusion. Mitral Valve: The mitral valve is grossly normal. Trivial mitral valve regurgitation. Tricuspid Valve: The tricuspid valve is grossly normal. Tricuspid valve regurgitation is trivial. Aortic Valve: The aortic valve is tricuspid. Aortic valve regurgitation is not visualized. Pulmonic Valve: The pulmonic valve was normal in structure. Pulmonic valve regurgitation is not visualized. Aorta: The aortic root and ascending aorta are structurally normal, with no evidence of dilitation. Venous: The inferior vena cava is dilated in size with less than 50% respiratory variability, suggesting right  atrial pressure of 15 mmHg. IAS/Shunts: No atrial level shunt detected by color flow Doppler.  LEFT VENTRICLE PLAX 2D LVIDd:         4.60 cm  Diastology LVIDs:         3.20 cm  LV e' medial:    7.51 cm/s LV PW:         1.10 cm  LV E/e' medial:  11.2 LV IVS:        0.90 cm  LV e' lateral:   6.64 cm/s LVOT diam:     2.30 cm  LV E/e' lateral: 12.7 LV SV:         94 LV SV Index:   36 LVOT Area:  4.15 cm  RIGHT VENTRICLE RV S prime:     13.50 cm/s TAPSE (M-mode): 2.4 cm LEFT ATRIUM             Index       RIGHT ATRIUM           Index LA diam:        4.00 cm 1.54 cm/m  RA Area:     20.80 cm LA Vol (A2C):   63.5 ml 24.38 ml/m RA Volume:   58.40 ml  22.42 ml/m LA Vol (A4C):   65.5 ml 25.15 ml/m LA Biplane Vol: 65.8 ml 25.26 ml/m  AORTIC VALVE LVOT Vmax:   92.00 cm/s LVOT Vmean:  66.500 cm/s LVOT VTI:    0.227 m  AORTA Ao Root diam: 3.10 cm Ao Asc diam:  3.10 cm MITRAL VALVE MV Area (PHT): 2.91 cm    SHUNTS MV Decel Time: 261 msec    Systemic VTI:  0.23 m MV E velocity: 84.40 cm/s  Systemic Diam: 2.30 cm MV A velocity: 82.70 cm/s MV E/A ratio:  1.02 Zoila Shutter MD Electronically signed by Zoila Shutter MD Signature Date/Time: 06/04/2020/11:57:47 AM    Final      Subjective: Breathing is much better. No issues overnight.   Discharge Exam: Vitals:   06/08/20 0658 06/08/20 0856  BP: 133/65   Pulse: 80   Resp: 18   Temp: (!) 97.5 F (36.4 C)   SpO2: 94% 94%   Vitals:   06/07/20 1949 06/07/20 2050 06/08/20 0658 06/08/20 0856  BP:  (!) 145/79 133/65   Pulse:   80   Resp:  18 18   Temp:  98 F (36.7 C) (!) 97.5 F (36.4 C)   TempSrc:  Oral Oral   SpO2: 92% 94% 94% 94%  Weight:      Height:        General: Pt is alert, awake, not in acute distress Cardiovascular: RRR, S1/S2 +, no rubs, no gallops Respiratory: CTA bilaterally, no wheezing, no rhonchi Abdominal: Soft, NT, ND, bowel sounds + Extremities: no cyanosis    The results of significant diagnostics from this hospitalization  (including imaging, microbiology, ancillary and laboratory) are listed below for reference.     Microbiology: Recent Results (from the past 240 hour(s))  Resp Panel by RT-PCR (Flu A&B, Covid) Nasopharyngeal Swab     Status: None   Collection Time: 06/03/20  1:30 PM   Specimen: Nasopharyngeal Swab; Nasopharyngeal(NP) swabs in vial transport medium  Result Value Ref Range Status   SARS Coronavirus 2 by RT PCR NEGATIVE NEGATIVE Final    Comment: (NOTE) SARS-CoV-2 target nucleic acids are NOT DETECTED.  The SARS-CoV-2 RNA is generally detectable in upper respiratory specimens during the acute phase of infection. The lowest concentration of SARS-CoV-2 viral copies this assay can detect is 138 copies/mL. A negative result does not preclude SARS-Cov-2 infection and should not be used as the sole basis for treatment or other patient management decisions. A negative result may occur with  improper specimen collection/handling, submission of specimen other than nasopharyngeal swab, presence of viral mutation(s) within the areas targeted by this assay, and inadequate number of viral copies(<138 copies/mL). A negative result must be combined with clinical observations, patient history, and epidemiological information. The expected result is Negative.  Fact Sheet for Patients:  BloggerCourse.com  Fact Sheet for Healthcare Providers:  SeriousBroker.it  This test is no t yet approved or cleared by the [place] and  has been authorized  for detection and/or diagnosis of SARS-CoV-2 by FDA under an Emergency Use Authorization (EUA). This EUA will remain  in effect (meaning this test can be used) for the duration of the COVID-19 declaration under Section 564(b)(1) of the Act, 21 U.S.C.section 360bbb-3(b)(1), unless the authorization is terminated  or revoked sooner.       Influenza A by PCR NEGATIVE NEGATIVE Final   Influenza B by PCR  NEGATIVE NEGATIVE Final    Comment: (NOTE) The Xpert Xpress SARS-CoV-2/FLU/RSV plus assay is intended as an aid in the diagnosis of influenza from Nasopharyngeal swab specimens and should not be used as a sole basis for treatment. Nasal washings and aspirates are unacceptable for Xpert Xpress SARS-CoV-2/FLU/RSV testing.  Fact Sheet for Patients: BloggerCourse.com  Fact Sheet for Healthcare Providers: SeriousBroker.it  This test is not yet approved or cleared by the [place] FDA and has been authorized for detection and/or diagnosis of SARS-CoV-2 by FDA under an Emergency Use Authorization (EUA). This EUA will remain in effect (meaning this test can be used) for the duration of the COVID-19 declaration under Section 564(b)(1) of the Act, 21 U.S.C. section 360bbb-3(b)(1), unless the authorization is terminated or revoked.  Performed at [facility], [address] [place], [place] [address]   Culture, blood (routine x 2)     Status: None (Preliminary result)   Collection Time: 06/04/20 10:05 AM   Specimen: BLOOD  Result Value Ref Range Status   Specimen Description   Final    BLOOD LEFT ANTECUBITAL Performed at [facility], [address], [place], [place] [address]    Special Requests   Final    BOTTLES DRAWN AEROBIC AND ANAEROBIC BACTEROIDES CACCAE Performed at [facility], [address], [place], [place] [address]    Culture   Final    NO GROWTH 3 DAYS Performed at [facility], [address], [place], [place] [address]    Report Status PENDING  Incomplete  Culture, blood (routine x 2)     Status: None (Preliminary result)   Collection Time: 06/04/20 10:11 AM   Specimen: BLOOD LEFT HAND  Result Value Ref Range Status   Specimen Description   Final    BLOOD LEFT HAND Performed at [facility], [address], [place], [place]  [address]    Special Requests   Final    BOTTLES DRAWN AEROBIC ONLY Blood Culture adequate volume Performed at [facility], [address], [place], [place] [address]    Culture   Final    NO GROWTH 3 DAYS Performed at [facility], [address], [place], [place] [address]    Report Status PENDING  Incomplete  Urine Culture     Status: None   Collection Time: 06/05/20  6:00 PM   Specimen: Urine, Clean Catch  Result Value Ref Range Status   Specimen Description   Final    URINE, CLEAN CATCH Performed at [facility], [address], [place], [place] [address]    Special Requests   Final    NONE Performed at [facility], [address] [place], [place] [address]    Culture   Final    NO GROWTH Performed at [facility], [address], [place], [place] [address]    Report Status 06/07/2020 FINAL  Final  Respiratory Panel by PCR     Status: None   Collection Time: 06/06/20 12:09 PM  Specimen: Nasopharyngeal Swab; Respiratory  Result Value Ref Range Status   Adenovirus NOT DETECTED NOT DETECTED Final   Coronavirus 229E NOT DETECTED NOT DETECTED Final    Comment: (NOTE) The Coronavirus on the Respiratory Panel, DOES NOT test for the novel  Coronavirus (2019 nCoV)    Coronavirus HKU1 NOT DETECTED NOT DETECTED Final   Coronavirus NL63 NOT DETECTED NOT DETECTED Final   Coronavirus OC43 NOT DETECTED NOT DETECTED Final   Metapneumovirus NOT DETECTED NOT DETECTED Final   Rhinovirus / Enterovirus NOT DETECTED NOT DETECTED Final   Influenza A NOT DETECTED NOT DETECTED Final   Influenza B NOT DETECTED NOT DETECTED Final   Parainfluenza Virus 1 NOT DETECTED NOT DETECTED Final   Parainfluenza Virus 2 NOT DETECTED NOT DETECTED Final   Parainfluenza Virus 3 NOT DETECTED NOT DETECTED Final   Parainfluenza Virus 4 NOT DETECTED NOT DETECTED Final   Respiratory Syncytial Virus NOT DETECTED NOT DETECTED Final    Bordetella pertussis NOT DETECTED NOT DETECTED Final   Chlamydophila pneumoniae NOT DETECTED NOT DETECTED Final   Mycoplasma pneumoniae NOT DETECTED NOT DETECTED Final    Comment: Performed at [facility], [address] [place], [place] [address]     Labs: BNP (last 3 results) Recent Labs    06/03/20 1251  BNP 213.6*   Basic Metabolic Panel: Recent Labs  Lab 06/03/20 1251 06/04/20 0502 06/05/20 0518 06/06/20 0558  NA 135 137 136 136  K 3.7 3.6 4.2 4.2  CL 101 102 101 101  CO2 GLUCOSE 109* 109* 153* 137*  BUN 16 17 31* 35*  CREATININE 1.09* 1.22* 1.23* 1.13*  CALCIUM 8.4* 8.4* 8.6* 9.1   Liver Function Tests: Recent Labs  Lab 06/04/20 0502  AST 14*  ALT 12  ALKPHOS 50  BILITOT 0.9  PROT 6.3*  ALBUMIN 3.0*   No results for input(s): LIPASE, AMYLASE in the last 168 hours. No results for input(s): AMMONIA in the last 168 hours. CBC: Recent Labs  Lab 06/03/20 1707 06/04/20 0502 06/05/20 0518 06/06/20 0558  WBC 18.6* 18.7* 8.6 10.5  NEUTROABS 11.0*  --   --   --   HGB 10.4* 9.8* 9.9* 10.5*  HCT 33.1* 31.3* 32.1* 33.2*  MCV 92.2 93.7 92.5 91.7  PLT 126* 122* 136* 165   Cardiac Enzymes: No results for input(s): CKTOTAL, CKMB, CKMBINDEX, TROPONINI in the last 168 hours. BNP: Invalid input(s): POCBNP CBG: No results for input(s): GLUCAP in the last 168 hours. D-Dimer No results for input(s): DDIMER in the last 72 hours. Hgb A1c No results for input(s): HGBA1C in the last 72 hours. Lipid Profile No results for input(s): CHOL, HDL, LDLCALC, TRIG, CHOLHDL, LDLDIRECT in the last 72 hours. Thyroid function studies No results for input(s): TSH, T4TOTAL, T3FREE, THYROIDAB in the last 72 hours.  Invalid input(s): FREET3 Anemia work up No results for input(s): VITAMINB12, FOLATE, FERRITIN, TIBC, IRON, RETICCTPCT in the last 72 hours. Urinalysis    Component Value Date/Time   COLORURINE GREEN (A) 06/05/2020 1800   APPEARANCEUR CLEAR  06/05/2020 1800   LABSPEC 1.015 06/05/2020 1800   PHURINE 5.5 06/05/2020 1800   GLUCOSEU NEGATIVE 06/05/2020 1800   HGBUR TRACE (A) 06/05/2020 1800   BILIRUBINUR NEGATIVE 06/05/2020 1800   BILIRUBINUR neg 04/08/2016 0916   KETONESUR NEGATIVE 06/05/2020 1800   PROTEINUR NEGATIVE 06/05/2020 1800   UROBILINOGEN negative 04/08/2016 0916   UROBILINOGEN 0.2 01/15/2013 1538   NITRITE NEGATIVE 06/05/2020 1800   LEUKOCYTESUR NEGATIVE  06/05/2020 1800   Sepsis Labs Invalid input(s): PROCALCITONIN,  WBC,  LACTICIDVEN Microbiology Recent Results (from the past 240 hour(s))  Resp Panel by RT-PCR (Flu A&B, Covid) Nasopharyngeal Swab     Status: None   Collection Time: 06/03/20  1:30 PM   Specimen: Nasopharyngeal Swab; Nasopharyngeal(NP) swabs in vial transport medium  Result Value Ref Range Status   SARS Coronavirus 2 by RT PCR NEGATIVE NEGATIVE Final    Comment: (NOTE) SARS-CoV-2 target nucleic acids are NOT DETECTED.  The SARS-CoV-2 RNA is generally detectable in upper respiratory specimens during the acute phase of infection. The lowest concentration of SARS-CoV-2 viral copies this assay can detect is 138 copies/mL. A negative result does not preclude SARS-Cov-2 infection and should not be used as the sole basis for treatment or other patient management decisions. A negative result may occur with  improper specimen collection/handling, submission of specimen other than nasopharyngeal swab, presence of viral mutation(s) within the areas targeted by this assay, and inadequate number of viral copies(<138 copies/mL). A negative result must be combined with clinical observations, patient history, and epidemiological information. The expected result is Negative.  Fact Sheet for Patients:  BloggerCourse.comhttps://www.fda.gov/media/152166/download  Fact Sheet for Healthcare Providers:  SeriousBroker.ithttps://www.fda.gov/media/152162/download  This test is no t yet approved or cleared by the [place] FDA and  has  been authorized for detection and/or diagnosis of SARS-CoV-2 by FDA under an Emergency Use Authorization (EUA). This EUA will remain  in effect (meaning this test can be used) for the duration of the COVID-19 declaration under Section 564(b)(1) of the Act, 21 U.S.C.section 360bbb-3(b)(1), unless the authorization is terminated  or revoked sooner.       Influenza A by PCR NEGATIVE NEGATIVE Final   Influenza B by PCR NEGATIVE NEGATIVE Final    Comment: (NOTE) The Xpert Xpress SARS-CoV-2/FLU/RSV plus assay is intended as an aid in the diagnosis of influenza from Nasopharyngeal swab specimens and should not be used as a sole basis for treatment. Nasal washings and aspirates are unacceptable for Xpert Xpress SARS-CoV-2/FLU/RSV testing.  Fact Sheet for Patients: BloggerCourse.comhttps://www.fda.gov/media/152166/download  Fact Sheet for Healthcare Providers: SeriousBroker.ithttps://www.fda.gov/media/152162/download  This test is not yet approved or cleared by the [place] FDA and has been authorized for detection and/or diagnosis of SARS-CoV-2 by FDA under an Emergency Use Authorization (EUA). This EUA will remain in effect (meaning this test can be used) for the duration of the COVID-19 declaration under Section 564(b)(1) of the Act, 21 U.S.C. section 360bbb-3(b)(1), unless the authorization is terminated or revoked.  Performed at [facility], [address] [place], [place] [address]   Culture, blood (routine x 2)     Status: None (Preliminary result)   Collection Time: 06/04/20 10:05 AM   Specimen: BLOOD  Result Value Ref Range Status   Specimen Description   Final    BLOOD LEFT ANTECUBITAL Performed at [facility], [address] [place], [place] [address]    Special Requests   Final    BOTTLES DRAWN AEROBIC AND ANAEROBIC BACTEROIDES CACCAE Performed at [facility], [address] [place], [place] [address]    Culture   Final    NO  GROWTH 3 DAYS Performed at [facility], [address] [place], [place] [address]    Report Status PENDING  Incomplete  Culture, blood (routine x 2)     Status: None (Preliminary result)   Collection Time: 06/04/20 10:11 AM   Specimen: BLOOD LEFT HAND  Result  Value Ref Range Status   Specimen Description   Final    BLOOD LEFT HAND Performed at [facility], [address] [place], [place] [address]    Special Requests   Final    BOTTLES DRAWN AEROBIC ONLY Blood Culture adequate volume Performed at [facility], [address], [place], [place] [address]    Culture   Final    NO GROWTH 3 DAYS Performed at [facility], [address], [place], [place] [address]    Report Status PENDING  Incomplete  Urine Culture     Status: None   Collection Time: 06/05/20  6:00 PM   Specimen: Urine, Clean Catch  Result Value Ref Range Status   Specimen Description   Final    URINE, CLEAN CATCH Performed at [facility], [address] [place], [place] [address]    Special Requests   Final    NONE Performed at [facility], [address] [place], [place] [address]    Culture   Final    NO GROWTH Performed at [facility], [address] [place], [place] [address]    Report Status 06/07/2020 FINAL  Final  Respiratory Panel by PCR     Status: None   Collection Time: 06/06/20 12:09 PM   Specimen: Nasopharyngeal Swab; Respiratory  Result Value Ref Range Status   Adenovirus NOT DETECTED NOT DETECTED Final   Coronavirus 229E NOT DETECTED NOT DETECTED Final    Comment: (NOTE) The Coronavirus on the Respiratory Panel, DOES NOT test for the novel  Coronavirus (2019 nCoV)    Coronavirus HKU1 NOT DETECTED NOT DETECTED Final   Coronavirus NL63 NOT DETECTED NOT DETECTED Final   Coronavirus OC43 NOT DETECTED NOT DETECTED Final   Metapneumovirus NOT DETECTED NOT DETECTED Final   Rhinovirus /  Enterovirus NOT DETECTED NOT DETECTED Final   Influenza A NOT DETECTED NOT DETECTED Final   Influenza B NOT DETECTED NOT DETECTED Final   Parainfluenza Virus 1 NOT DETECTED NOT DETECTED Final   Parainfluenza Virus 2 NOT DETECTED NOT DETECTED Final   Parainfluenza Virus 3 NOT DETECTED NOT DETECTED Final   Parainfluenza Virus 4 NOT DETECTED NOT DETECTED Final   Respiratory Syncytial Virus NOT DETECTED NOT DETECTED Final   Bordetella pertussis NOT DETECTED NOT DETECTED Final   Chlamydophila pneumoniae NOT DETECTED NOT DETECTED Final   Mycoplasma pneumoniae NOT DETECTED NOT DETECTED Final    Comment: Performed at [facility], [address] [place], [place] [address]     Time coordinating discharge: 35 minutes  SIGNED:   Jacquelin Hawking, MD [facility] 06/08/2020, 12:15 PM

## 2020-06-08 NOTE — Plan of Care (Signed)
Instructions were reviewed with patient. All questions were answered. Patient was transported to main entrance by wheelchair. ° °

## 2020-06-08 NOTE — Progress Notes (Signed)
Physical Therapy Treatment Patient Details Name: Sally Wise MRN: [id number] DOB: May 26, 1946 Today's Date: 06/08/2020    History of Present Illness Patient is 74 y.o. female with PMH significant for PAF, CHF, hypothyroidism, obesity, bil TKA. Patient present to [facility] with SOB and fatigue, found ot be hypoxic in ED. Covid and Flu negative.    PT Comments    Pt progressing. incr gait distance. SpO2=96% on RA after amb (dinamap in hallway with dead battery, therefore unable to get true amb sats). O2 off since earlier this am. Advised RN may want to spot check at rest, continue HHPT.    Follow Up Recommendations  Home health PT;Supervision - Intermittent     Equipment Recommendations  Rolling walker with 5" wheels    Recommendations for Other Services       Precautions / Restrictions Precautions Precautions: Fall Precaution Comments: watch O2 sats, pt reports about 3 falls at home in last 6 months Restrictions Weight Bearing Restrictions: No    Mobility  Bed Mobility               General bed mobility comments: in recliner   Transfers Overall transfer level: Needs assistance Equipment used: Rolling walker (2 wheeled) Transfers: Sit to/from Stand Sit to Stand: Supervision         General transfer comment: cues for hand placement and to power up with LEs,  uses momentum to come to full stand   Ambulation/Gait Ambulation/Gait assistance: Min guard;Supervision Gait Distance (Feet): 160 Feet Assistive device: Rolling walker (2 wheeled) Gait Pattern/deviations: Step-through pattern;Decreased stride length;Trunk flexed     General Gait Details: cues for RW position and posture. SpO2=96% on RA   Stairs             Wheelchair Mobility    Modified Rankin (Stroke Patients Only)       Balance             Standing balance-Leahy Scale: Fair Standing balance comment: briefly able to stand without UE support, reliant on UEs for dynamic                              Cognition Arousal/Alertness: Awake/alert Behavior During Therapy: WFL for tasks assessed/performed Overall Cognitive Status: Within Functional Limits for tasks assessed                                        Exercises      General Comments        Pertinent Vitals/Pain Pain Assessment: No/denies pain    Home Living                      Prior Function            PT Goals (current goals can now be found in the care plan section) Acute Rehab PT Goals Patient Stated Goal: regain strength, balance, and endurance PT Goal Formulation: With patient Time For Goal Achievement: 06/19/20 Potential to Achieve Goals: Good Progress towards PT goals: Progressing toward goals    Frequency    Min 3X/week      PT Plan Current plan remains appropriate    Co-evaluation              AM-PAC PT "6 Clicks" Mobility   Outcome Measure  Help needed turning from your back to your side  while in a flat bed without using bedrails?: A Little Help needed moving from lying on your back to sitting on the side of a flat bed without using bedrails?: A Little Help needed moving to and from a bed to a chair (including a wheelchair)?: A Little Help needed standing up from a chair using your arms (e.g., wheelchair or bedside chair)?: A Little Help needed to walk in hospital room?: A Little Help needed climbing 3-5 steps with a railing? : A Lot 6 Click Score: 17    End of Session Equipment Utilized During Treatment: Gait belt Activity Tolerance: Patient tolerated treatment well Patient left: in chair;with call bell/phone within reach;with chair alarm set Nurse Communication: Mobility status PT Visit Diagnosis: Other abnormalities of gait and mobility (R26.89);Muscle weakness (generalized) (M62.81);Difficulty in walking, not elsewhere classified (R26.2);History of falling (Z91.81)     Time: 1114-1130 PT Time Calculation (min) (ACUTE  ONLY): 16 min  Charges:  $Gait Training: 8-22 mins                     Delice Bison, PT  Acute Rehab Dept ([facility]) [phone] Pager [phone]  06/08/2020    [facility] 06/08/2020, 1:13 PM

## 2020-06-08 NOTE — Discharge Instructions (Signed)
Sally Wise,  You are in the hospital because of pneumonia. He also had some evidence of fluid in your lungs. You are treated with antibiotics and Lasix to help with your breathing. Required oxygen because of your pneumonia and your oxygen levels have improved. Thankfully, you were able to be weaned off oxygen prior to your discharge. You are discharged with antibiotics to continue your course. I have also provided you with a short course of steroid in addition to albuterol as needed to help with your wheezing. I have discharged you with a few days of Lasix and I recommend you following up with your cardiologist for follow-up for your heart.

## 2020-06-08 NOTE — TOC Transition Note (Addendum)
Transition of Care [facility]) - CM/SW Discharge Note   Patient Details  Name: KEISHA AMER MRN: [id number] Date of Birth: Apr 03, 1946  [facility] CM/SW Contact:  Amada Jupiter, LCSW Phone Number: 06/08/2020, 1:01 PM   Clinical Narrative:    Pt medically cleared for dc today.  Reviewed MD orders for HHPT/OT and pt agreeable.  Referral placed with [facility].  No O2 needs.  No further TOC needs.   Final next level of care: Home w Home Health Services Barriers to Discharge: Barriers Resolved   Patient Goals and CMS Choice Patient states their goals for this hospitalization and ongoing recovery are:: go home CMS Medicare.gov Compare Post Acute Care list provided to:: Patient Choice offered to / list presented to : Patient  Discharge Placement                       Discharge Plan and Services                DME Arranged: Walker rolling DME Agency: [employer] Date DME Agency Contacted: 06/08/20 Time DME Agency Contacted: 1300 Representative spoke with at DME Agency: Velna Hatchet HH Arranged: OT, PT HH Agency: [facility]   Time [facility] Agency Contacted: 0900 Representative spoke with at [facility] Agency: Kandee Keen  Social Determinants of Health (SDOH) Interventions     Readmission Risk Interventions Readmission Risk Prevention Plan 06/08/2020  Post Dischage Appt Complete  Medication Screening Complete  Transportation Screening Complete  Some recent data might be hidden

## 2020-06-09 LAB — CULTURE, BLOOD (ROUTINE X 2)
Culture: NO GROWTH
Culture: NO GROWTH
Special Requests: ADEQUATE

## 2020-06-11 DIAGNOSIS — N200 Calculus of kidney: Secondary | ICD-10-CM | POA: Diagnosis not present

## 2020-06-11 DIAGNOSIS — I083 Combined rheumatic disorders of mitral, aortic and tricuspid valves: Secondary | ICD-10-CM | POA: Diagnosis not present

## 2020-06-11 DIAGNOSIS — J9601 Acute respiratory failure with hypoxia: Secondary | ICD-10-CM | POA: Diagnosis not present

## 2020-06-11 DIAGNOSIS — E039 Hypothyroidism, unspecified: Secondary | ICD-10-CM | POA: Diagnosis not present

## 2020-06-11 DIAGNOSIS — I0981 Rheumatic heart failure: Secondary | ICD-10-CM | POA: Diagnosis not present

## 2020-06-11 DIAGNOSIS — Z8701 Personal history of pneumonia (recurrent): Secondary | ICD-10-CM | POA: Diagnosis not present

## 2020-06-11 DIAGNOSIS — D696 Thrombocytopenia, unspecified: Secondary | ICD-10-CM | POA: Diagnosis not present

## 2020-06-11 DIAGNOSIS — E559 Vitamin D deficiency, unspecified: Secondary | ICD-10-CM | POA: Diagnosis not present

## 2020-06-11 DIAGNOSIS — M4184 Other forms of scoliosis, thoracic region: Secondary | ICD-10-CM | POA: Diagnosis not present

## 2020-06-11 DIAGNOSIS — I5033 Acute on chronic diastolic (congestive) heart failure: Secondary | ICD-10-CM | POA: Diagnosis not present

## 2020-06-11 DIAGNOSIS — Z9181 History of falling: Secondary | ICD-10-CM | POA: Diagnosis not present

## 2020-06-11 DIAGNOSIS — Z6841 Body Mass Index (BMI) 40.0 and over, adult: Secondary | ICD-10-CM | POA: Diagnosis not present

## 2020-06-11 DIAGNOSIS — I480 Paroxysmal atrial fibrillation: Secondary | ICD-10-CM | POA: Diagnosis not present

## 2020-06-11 DIAGNOSIS — Z7952 Long term (current) use of systemic steroids: Secondary | ICD-10-CM | POA: Diagnosis not present

## 2020-06-11 DIAGNOSIS — Z96653 Presence of artificial knee joint, bilateral: Secondary | ICD-10-CM | POA: Diagnosis not present

## 2020-06-27 DIAGNOSIS — R531 Weakness: Secondary | ICD-10-CM | POA: Diagnosis not present

## 2020-06-27 DIAGNOSIS — R059 Cough, unspecified: Secondary | ICD-10-CM | POA: Diagnosis not present

## 2020-06-27 DIAGNOSIS — J189 Pneumonia, unspecified organism: Secondary | ICD-10-CM | POA: Diagnosis not present

## 2020-07-11 DIAGNOSIS — N200 Calculus of kidney: Secondary | ICD-10-CM | POA: Diagnosis not present

## 2020-07-11 DIAGNOSIS — Z7952 Long term (current) use of systemic steroids: Secondary | ICD-10-CM | POA: Diagnosis not present

## 2020-07-11 DIAGNOSIS — Z96653 Presence of artificial knee joint, bilateral: Secondary | ICD-10-CM | POA: Diagnosis not present

## 2020-07-11 DIAGNOSIS — I480 Paroxysmal atrial fibrillation: Secondary | ICD-10-CM | POA: Diagnosis not present

## 2020-07-11 DIAGNOSIS — Z9181 History of falling: Secondary | ICD-10-CM | POA: Diagnosis not present

## 2020-07-11 DIAGNOSIS — M4184 Other forms of scoliosis, thoracic region: Secondary | ICD-10-CM | POA: Diagnosis not present

## 2020-07-11 DIAGNOSIS — I5033 Acute on chronic diastolic (congestive) heart failure: Secondary | ICD-10-CM | POA: Diagnosis not present

## 2020-07-11 DIAGNOSIS — Z6841 Body Mass Index (BMI) 40.0 and over, adult: Secondary | ICD-10-CM | POA: Diagnosis not present

## 2020-07-11 DIAGNOSIS — Z8701 Personal history of pneumonia (recurrent): Secondary | ICD-10-CM | POA: Diagnosis not present

## 2020-07-11 DIAGNOSIS — J9601 Acute respiratory failure with hypoxia: Secondary | ICD-10-CM | POA: Diagnosis not present

## 2020-07-11 DIAGNOSIS — I083 Combined rheumatic disorders of mitral, aortic and tricuspid valves: Secondary | ICD-10-CM | POA: Diagnosis not present

## 2020-07-11 DIAGNOSIS — D696 Thrombocytopenia, unspecified: Secondary | ICD-10-CM | POA: Diagnosis not present

## 2020-07-11 DIAGNOSIS — I0981 Rheumatic heart failure: Secondary | ICD-10-CM | POA: Diagnosis not present

## 2020-07-11 DIAGNOSIS — E039 Hypothyroidism, unspecified: Secondary | ICD-10-CM | POA: Diagnosis not present

## 2020-07-11 DIAGNOSIS — E559 Vitamin D deficiency, unspecified: Secondary | ICD-10-CM | POA: Diagnosis not present

## 2020-07-17 ENCOUNTER — Other Ambulatory Visit: Payer: Self-pay | Admitting: Family Medicine

## 2020-07-17 DIAGNOSIS — R918 Other nonspecific abnormal finding of lung field: Secondary | ICD-10-CM

## 2020-07-17 DIAGNOSIS — R9389 Abnormal findings on diagnostic imaging of other specified body structures: Secondary | ICD-10-CM

## 2020-07-23 NOTE — Progress Notes (Signed)
Patient ID: Sally Wise, female   DOB: 22-Aug-1945, 75 y.o.   MRN: [id number]   [address] [place], [place]  [address] Phone: [phone] Fax:  [phone]  Date:  07/27/2020   Patient ID:  Sally Wise, Sally Wise [birth year], MRN [id number]   PCP:  Daisy Floro, MD  Cardiologist:  Eden Emms  History of Present Illness: Sally Wise is a 75 y.o. female with history of PAF, morbid obesity, hypothyroidism thrombocytopenia who presents  for f/u  of AF. Has had a few episodes of PAF lasting an hour or two mostly in 2014 and 2015  07/12/14 EF 50-55% normal LA size no valve disease. Myovue done 08/08/15 diaphragmatic attenuation no ischemia EF 72%   Since episodes infrequent using cardizem ASA and pill in pocket flecainide  She has had only 2 episodes in last 5 years  One 12/15 and one 7/14  She is convinced she knows when she is in afib    Daughter has  spina bifida  And had cervix surgery in May which was stressful  Hospitalized 11/21-11/26 for pneumonia COVID and flu negative BNP was only 213 RX with azithromycin and Cefdinir TTE showed EF 55-60 % only grade one diastolic dysfunction and no significant valve disease 06/04/20   Seems to be less mobile with back and hip issues     Recent Labs: 06/03/2020: B Natriuretic Peptide 213.6 06/04/2020: ALT 12 06/06/2020: Creatinine, Ser 1.13; Hemoglobin 10.5; Potassium 4.2  Wt Readings from Last 3 Encounters:  07/27/20 (!) 146.7 kg  06/07/20 (!) 150.5 kg  10/04/19 (!) 160.5 kg     Past Medical History:  Diagnosis Date  . Hematuria    evaluation with Dr. Etta Grandchild  . Hypothyroidism 06/2010  . Kidney stone 04/2004  . Lichen sclerosus et atrophicus 2/13   biopsy proven  . LVH (left ventricular hypertrophy)   . Morbid obesity (HCC)   . PAF (paroxysmal atrial fibrillation) (HCC)    a. Remote hx, re-established care 01/2013 with Dr. Eden Emms for recurrence. 2D echo 01/2013: mod LVH, EF 55-65%, no RWMA, grade 1 d/d. b. 06/2014:  started on Xarelto.   . Thrombocytopenia (HCC)   . Vitamin D deficiency 06/2010  . Wears glasses     Current Outpatient Prescriptions  Medication Sig Dispense Refill  . Cholecalciferol (VITAMIN D PO) Take 200 Int'l Units by mouth.    Marland Kitchen ibuprofen (ADVIL,MOTRIN) 200 MG tablet Take 200 mg by mouth daily.     Marland Kitchen levothyroxine (SYNTHROID, LEVOTHROID) 112 MCG tablet Take 112 mcg by mouth daily before breakfast.    . diltiazem (CARDIZEM CD) 120 MG 24 hr capsule Take 1 capsule (120 mg total) by mouth daily. 90 capsule 3         No current facility-administered medications for this visit.    Allergies:   Patient has no known allergies.   Social History:  The patient  reports that she has never smoked. She has never used smokeless tobacco. She reports that she does not drink alcohol and does not use drugs.   Family History:  The patient's family history includes Alzheimer's disease in her father; Diabetes in her brother; Hypertension in her brother and mother; Kidney disease in her mother; Spina bifida in her daughter.   ROS:  Please see the history of present illness.   All other systems reviewed and negative.   PHYSICAL EXAM:  VS:  BP (!) 148/80   Pulse 80   Ht 5\' 8"  (1.727  m)   Wt (!) 146.7 kg   LMP 07/14/2001   SpO2 96%   BMI 49.17 kg/m   Affect appropriate Overweight white female  HEENT: normal Neck supple with no adenopathy JVP normal no bruits no thyromegaly Lungs clear with no wheezing and good diaphragmatic motion Heart:  S1/S2 no murmur, no rub, gallop or click PMI normal Abdomen: benighn, BS positve, no tenderness, no AAA no bruit.  No HSM or HJR Distal pulses intact with no bruits No edema Neuro non-focal Skin warm and dry No muscular weakness   EKG:  NSR 67bpm, no acute ST-T changes, QTc   07/06/14  04/11/16 SR rate 57 LAD low voltage  05/08/17 SR rate 72 insignificant Q inferiorly low Precordial voltage from obesity   ASSESSMENT AND PLAN:  1. PAF -  CHADVASC 2 infrequent episodes  Continue cardizem   pill in pocket flecainide.   2. Hypothyroidism - TSH recently normal.Labs followed by primary continue current replacement dose synthroid 3. Morbid obesity - discussed caloric intake and exercise  4. Thrombocytopenia,chronic - ? IT  not a drinker seen by Heme and no further w/u deemed necessary  PLT 165 06/06/20  5. Hyperglycemia - A1c only 5.7 last visit  6. Chest pain  myovue done 08/08/15 normal  7. Pulmonary :  Pneumonia November 2021 with previously abnormal chest CT multiple nodules will update non contrast CT scan and can f/u with primary Appears that Dr Tenny Craw has ordered CT for 08/10/20   F/u with me in a year   Charlton Haws

## 2020-07-26 ENCOUNTER — Ambulatory Visit: Payer: Medicare Other | Admitting: Obstetrics and Gynecology

## 2020-07-27 ENCOUNTER — Other Ambulatory Visit: Payer: Self-pay

## 2020-07-27 ENCOUNTER — Encounter: Payer: Self-pay | Admitting: Cardiovascular Disease

## 2020-07-27 ENCOUNTER — Ambulatory Visit (INDEPENDENT_AMBULATORY_CARE_PROVIDER_SITE_OTHER): Payer: Medicare Other | Admitting: Cardiovascular Disease

## 2020-07-27 VITALS — BP 148/80 | HR 80 | Ht 68.0 in | Wt 323.4 lb

## 2020-07-27 DIAGNOSIS — I480 Paroxysmal atrial fibrillation: Secondary | ICD-10-CM | POA: Diagnosis not present

## 2020-07-27 NOTE — Patient Instructions (Signed)
Medication Instructions:  *If you need a refill on your cardiac medications before your next appointment, please call your pharmacy*  Follow-Up: At [facility], you and your health needs are our priority.  As part of our continuing mission to provide you with exceptional heart care, we have created designated Provider Care Teams.  These Care Teams include your primary Cardiologist (physician) and Advanced Practice Providers (APPs -  Physician Assistants and Nurse Practitioners) who all work together to provide you with the care you need, when you need it.  We recommend signing up for the patient portal called "MyChart".  Sign up information is provided on this After Visit Summary.  MyChart is used to connect with patients for Virtual Visits (Telemedicine).  Patients are able to view lab/test results, encounter notes, upcoming appointments, etc.  Non-urgent messages can be sent to your provider as well.   To learn more about what you can do with MyChart, go to [email or web address].com.    Your next appointment:   Your physician wants you to follow-up in: 1 YEAR with Dr. Nishan. You will receive a reminder letter in the mail two months in advance. If you don't receive a letter, please call our office to schedule the follow-up appointment.  The format for your next appointment:   In Person with Peter Nishan, MD     

## 2020-08-02 DIAGNOSIS — Z09 Encounter for follow-up examination after completed treatment for conditions other than malignant neoplasm: Secondary | ICD-10-CM | POA: Diagnosis not present

## 2020-08-02 DIAGNOSIS — J189 Pneumonia, unspecified organism: Secondary | ICD-10-CM | POA: Diagnosis not present

## 2020-08-02 DIAGNOSIS — H6123 Impacted cerumen, bilateral: Secondary | ICD-10-CM | POA: Diagnosis not present

## 2020-08-10 ENCOUNTER — Ambulatory Visit
Admission: RE | Admit: 2020-08-10 | Discharge: 2020-08-10 | Disposition: A | Discharge status: Home / Self Care | Payer: Medicare Other | Source: Ambulatory Visit | Attending: Family Medicine | Admitting: Family Medicine

## 2020-08-10 ENCOUNTER — Other Ambulatory Visit: Payer: Self-pay

## 2020-08-10 DIAGNOSIS — R9389 Abnormal findings on diagnostic imaging of other specified body structures: Secondary | ICD-10-CM

## 2020-08-10 DIAGNOSIS — R918 Other nonspecific abnormal finding of lung field: Secondary | ICD-10-CM

## 2020-08-24 ENCOUNTER — Ambulatory Visit (INDEPENDENT_AMBULATORY_CARE_PROVIDER_SITE_OTHER): Payer: Medicare Other | Admitting: Obstetrics and Gynecology

## 2020-08-24 ENCOUNTER — Other Ambulatory Visit: Payer: Self-pay

## 2020-08-24 ENCOUNTER — Encounter: Payer: Self-pay | Admitting: Obstetrics and Gynecology

## 2020-08-24 VITALS — BP 110/80 | HR 88 | Ht 68.0 in

## 2020-08-24 DIAGNOSIS — L900 Lichen sclerosus et atrophicus: Secondary | ICD-10-CM | POA: Diagnosis not present

## 2020-08-24 DIAGNOSIS — Z6841 Body Mass Index (BMI) 40.0 and over, adult: Secondary | ICD-10-CM | POA: Insufficient documentation

## 2020-08-24 DIAGNOSIS — R351 Nocturia: Secondary | ICD-10-CM | POA: Diagnosis not present

## 2020-08-24 DIAGNOSIS — Z79899 Other long term (current) drug therapy: Secondary | ICD-10-CM | POA: Insufficient documentation

## 2020-08-24 DIAGNOSIS — B372 Candidiasis of skin and nail: Secondary | ICD-10-CM

## 2020-08-24 DIAGNOSIS — N814 Uterovaginal prolapse, unspecified: Secondary | ICD-10-CM | POA: Diagnosis not present

## 2020-08-24 DIAGNOSIS — M419 Scoliosis, unspecified: Secondary | ICD-10-CM | POA: Insufficient documentation

## 2020-08-24 DIAGNOSIS — Z01411 Encounter for gynecological examination (general) (routine) with abnormal findings: Secondary | ICD-10-CM

## 2020-08-24 DIAGNOSIS — I5033 Acute on chronic diastolic (congestive) heart failure: Secondary | ICD-10-CM | POA: Insufficient documentation

## 2020-08-24 DIAGNOSIS — N200 Calculus of kidney: Secondary | ICD-10-CM | POA: Insufficient documentation

## 2020-08-24 DIAGNOSIS — I0981 Rheumatic heart failure: Secondary | ICD-10-CM | POA: Insufficient documentation

## 2020-08-24 DIAGNOSIS — N183 Chronic kidney disease, stage 3 unspecified: Secondary | ICD-10-CM | POA: Insufficient documentation

## 2020-08-24 DIAGNOSIS — R9389 Abnormal findings on diagnostic imaging of other specified body structures: Secondary | ICD-10-CM | POA: Insufficient documentation

## 2020-08-24 DIAGNOSIS — R911 Solitary pulmonary nodule: Secondary | ICD-10-CM | POA: Insufficient documentation

## 2020-08-24 DIAGNOSIS — I700 Atherosclerosis of aorta: Secondary | ICD-10-CM | POA: Insufficient documentation

## 2020-08-24 DIAGNOSIS — I083 Combined rheumatic disorders of mitral, aortic and tricuspid valves: Secondary | ICD-10-CM | POA: Insufficient documentation

## 2020-08-24 DIAGNOSIS — E7800 Pure hypercholesterolemia, unspecified: Secondary | ICD-10-CM | POA: Insufficient documentation

## 2020-08-24 MED ORDER — CLOBETASOL PROPIONATE 0.05 % EX OINT: TOPICAL_OINTMENT | CUTANEOUS | 0 refills | Status: DC

## 2020-08-24 MED ORDER — TRIAMCINOLONE ACETONIDE 0.025 % EX OINT: TOPICAL_OINTMENT | CUTANEOUS | 0 refills | Status: DC

## 2020-08-24 MED ORDER — KETOCONAZOLE 2 % EX CREA: 1.0000 "application " | TOPICAL_CREAM | Freq: Two times a day (BID) | CUTANEOUS | 0 refills | Status: DC

## 2020-08-24 NOTE — Patient Instructions (Addendum)
Skin Yeast Infection  A skin yeast infection is a condition in which there is an overgrowth of yeast (candida) that normally lives on the skin. This condition usually occurs in areas of the skin that are constantly warm and moist, such as the armpits or the groin. What are the causes? This condition is caused by a change in the normal balance of the yeast and bacteria that live on the skin. What increases the risk? You are more likely to develop this condition if you:  Are obese.  Are pregnant.  Take birth control pills.  Have diabetes.  Take antibiotic medicines.  Take steroid medicines.  Are malnourished.  Have a weak body defense system (immune system).  Are 14 years of age or older.  Wear tight clothing. What are the signs or symptoms? The most common symptom of this condition is itchiness in the affected area. Other symptoms include:  Red, swollen area of the skin.  Bumps on the skin. How is this diagnosed?  This condition is diagnosed with a medical history and physical exam.  Your health care provider may check for yeast by taking light scrapings of the skin to be viewed under a microscope. How is this treated? This condition is treated with medicine. Medicines may be prescribed or be available over the counter. The medicines may be:  Taken by mouth (orally).  Applied as a cream or powder to your skin. Follow these instructions at home:  Take or apply over-the-counter and prescription medicines only as told by your health care provider.  Maintain a healthy weight. If you need help losing weight, talk with your health care provider.  Keep your skin clean and dry.  If you have diabetes, keep your blood sugar under control.  Keep all follow-up visits as told by your health care provider. This is important.   Contact a health care provider if:  Your symptoms go away and then return.  Your symptoms do not get better with treatment.  Your symptoms get  worse.  Your rash spreads.  You have a fever or chills.  You have new symptoms.  You have new warmth or redness of your skin. Summary  A skin yeast infection is a condition in which there is an overgrowth of yeast (candida) that normally lives on the skin. This condition is caused by a change in the normal balance of the yeast and bacteria that live on the skin.  Take or apply over-the-counter and prescription medicines only as told by your health care provider.  Keep your skin clean and dry.  Contact a health care provider if your symptoms do not get better with treatment. This information is not intended to replace advice given to you by your health care provider. Make sure you discuss any questions you have with your health care provider. Document Revised: 11/17/2017 Document Reviewed: 11/17/2017 Elsevier Patient Education  2021 Elsevier Inc.  EXERCISE   We recommended that you start or continue a regular exercise program for good health. Physical activity is anything that gets your body moving, some is better than none. The CDC recommends 150 minutes per week of Moderate-Intensity Aerobic Activity and 2 or more days of Muscle Strengthening Activity.  Benefits of exercise are limitless: helps weight loss/weight maintenance, improves mood and energy, helps with depression and anxiety, improves sleep, tones and strengthens muscles, improves balance, improves bone density, protects from chronic conditions such as heart disease, high blood pressure and diabetes and so much more. To learn more visit:  http://kirby-bean.org/  DIET: Good nutrition starts with a healthy diet of fruits, vegetables, whole grains, and lean protein sources. Drink plenty of water for hydration. Minimize empty calories, sodium, sweets. For more information about dietary recommendations visit: CriticalGas.be and  https://www.carpenter-henry.info/  ALCOHOL:  Women should limit their alcohol intake to no more than 7 drinks/beers/glasses of wine (combined, not each!) per week. Moderation of alcohol intake to this level decreases your risk of breast cancer and liver damage.  If you are concerned that you may have a problem, or your friends have told you they are concerned about your drinking, there are many resources to help. A well-known program that is free, effective, and available to all people all over the nation is Alcoholics Anonymous.  Check out this site to learn more: BeverageBargains.co.za   CALCIUM AND VITAMIN D:  Adequate intake of calcium and Vitamin D are recommended for bone health.  You should be getting between 1000-1200 mg of calcium and 800 units of Vitamin D daily between diet and supplements  PAP SMEARS:  Pap smears, to check for cervical cancer or precancers,  have traditionally been done yearly, scientific advances have shown that most women can have pap smears less often.  However, every woman still should have a physical exam from her gynecologist every year. It will include a breast check, inspection of the vulva and vagina to check for abnormal growths or skin changes, a visual exam of the cervix, and then an exam to evaluate the size and shape of the uterus and ovaries. We will also provide age appropriate advice regarding health maintenance, like when you should have certain vaccines, screening for sexually transmitted diseases, bone density testing, colonoscopy, mammograms, etc.   MAMMOGRAMS:  All women over 61 years old should have a routine mammogram.   COLON CANCER SCREENING: Now recommend starting at age 15. At this time colonoscopy is not covered for routine screening until 50. There are take home tests that can be done between 45-49.   COLONOSCOPY:  Colonoscopy to screen for colon cancer is recommended for all women at age 79.  We know, you hate the idea of the prep.  We agree, BUT, having  colon cancer and not knowing it is worse!!  Colon cancer so often starts as a polyp that can be seen and removed at colonscopy, which can quite literally save your life!  And if your first colonoscopy is normal and you have no family history of colon cancer, most women don't have to have it again for 10 years.  Once every ten years, you can do something that may end up saving your life, right?  We will be happy to help you get it scheduled when you are ready.  Be sure to check your insurance coverage so you understand how much it will cost.  It may be covered as a preventative service at no cost, but you should check your particular policy.      Breast Self-Awareness Breast self-awareness means being familiar with how your breasts look and feel. It involves checking your breasts regularly and reporting any changes to your health care provider. Practicing breast self-awareness is important. A change in your breasts can be a sign of a serious medical problem. Being familiar with how your breasts look and feel allows you to find any problems early, when treatment is more likely to be successful. All women should practice breast self-awareness, including women who have had breast implants. How to do a breast self-exam One way  to learn what is normal for your breasts and whether your breasts are changing is to do a breast self-exam. To do a breast self-exam: Look for Changes  1. Remove all the clothing above your waist. 2. Stand in front of a mirror in a room with good lighting. 3. Put your hands on your hips. 4. Push your hands firmly downward. 5. Compare your breasts in the mirror. Look for differences between them (asymmetry), such as: ? Differences in shape. ? Differences in size. ? Puckers, dips, and bumps in one breast and not the other. 6. Look at each breast for changes in your skin, such as: ? Redness. ? Scaly areas. 7. Look for changes in your nipples, such  as: ? Discharge. ? Bleeding. ? Dimpling. ? Redness. ? A change in position. Feel for Changes Carefully feel your breasts for lumps and changes. It is best to do this while lying on your back on the floor and again while sitting or standing in the shower or tub with soapy water on your skin. Feel each breast in the following way:  Place the arm on the side of the breast you are examining above your head.  Feel your breast with the other hand.  Start in the nipple area and make  inch (2 cm) overlapping circles to feel your breast. Use the pads of your three middle fingers to do this. Apply light pressure, then medium pressure, then firm pressure. The light pressure will allow you to feel the tissue closest to the skin. The medium pressure will allow you to feel the tissue that is a little deeper. The firm pressure will allow you to feel the tissue close to the ribs.  Continue the overlapping circles, moving downward over the breast until you feel your ribs below your breast.  Move one finger-width toward the center of the body. Continue to use the  inch (2 cm) overlapping circles to feel your breast as you move slowly up toward your collarbone.  Continue the up and down exam using all three pressures until you reach your armpit.  Write Down What You Find  Write down what is normal for each breast and any changes that you find. Keep a written record with breast changes or normal findings for each breast. By writing this information down, you do not need to depend only on memory for size, tenderness, or location. Write down where you are in your menstrual cycle, if you are still menstruating. If you are having trouble noticing differences in your breasts, do not get discouraged. With time you will become more familiar with the variations in your breasts and more comfortable with the exam. How often should I examine my breasts? Examine your breasts every month. If you are breastfeeding, the  best time to examine your breasts is after a feeding or after using a breast pump. If you menstruate, the best time to examine your breasts is 5-7 days after your period is over. During your period, your breasts are lumpier, and it may be more difficult to notice changes. When should I see my health care provider? See your health care provider if you notice:  A change in shape or size of your breasts or nipples.  A change in the skin of your breast or nipples, such as a reddened or scaly area.  Unusual discharge from your nipples.  A lump or thick area that was not there before.  Pain in your breasts.  Anything that concerns you.

## 2020-08-24 NOTE — Progress Notes (Signed)
75 y.o. G2P2002 Married White or Caucasian Not Hispanic or Latino female here for annual exam. She is also here to follow up on her lichen sclerosus. She is also having some skin issues.    She uses Vaseline most days on her vulva, using the steroid intermittently. Uses it for a day or two.  She c/o severe irritation under her panus. It stings, itching, bleeding.  Using powder in that area, not helping.   She has a h/o grade 2 uterine prolapse with elongated cervix and grade 1 cystocele. She is aware of it, not bothering her.   No bowel or bladder c/o. Not sure if she leaks urine, if so it's not much.   She is having trouble standing up, legs are week.  She is mildly depressed because of her physical state.     Patient's last menstrual period was 07/14/2001.          Sexually active: No.  The current method of family planning is post menopausal status.    Exercising: No.  The patient does not participate in regular exercise at present. Smoker:  no  Health Maintenance: Pap:  01-04-15 WNL 10-28-12 WNL History of abnormal Pap:  no MMG:  05/21/19 Bi-rads 1 neg, she will schedule  BMD:  01-08-11 normal, will do with her primary Colonoscopy: 02-06-14 polyps repeat in 3-5 yrs, got a letter from GI doesn't need colonoscopy until 2023.  TDaP:  01/08/11 Gardasil: NA   reports that she has never smoked. She has never used smokeless tobacco. She reports that she does not drink alcohol and does not use drugs. Daughter lives with her and her husband, works in [place]. Other daughter is here in [place]. No grandchildren  Past Medical History:  Diagnosis Date  . Hematuria    evaluation with Dr. Etta Grandchild  . Hypothyroidism 06/2010  . Kidney stone 04/2004  . Lichen sclerosus et atrophicus 2/13   biopsy proven  . LVH (left ventricular hypertrophy)   . Morbid obesity (HCC)   . PAF (paroxysmal atrial fibrillation) (HCC)    a. Remote hx, re-established care 01/2013 with Dr. Eden Emms for recurrence. 2D echo  01/2013: mod LVH, EF 55-65%, no RWMA, grade 1 d/d. b. 06/2014: started on Xarelto.   . Thrombocytopenia (HCC)   . Vitamin D deficiency 06/2010  . Wears glasses     Past Surgical History:  Procedure Laterality Date  . BREAST EXCISIONAL BIOPSY  2010   left--was a vascular lesion  . CARPAL TUNNEL RELEASE Left 08/09/2013   Procedure: LEFT CARPAL TUNNEL RELEASE;  Surgeon: Wyn Forster., MD;  Location: [facility];  Service: Orthopedics;  Laterality: Left;  . CATARACT EXTRACTION    . COLONOSCOPY  04/2003      . DILATION AND CURETTAGE OF UTERUS    . HYSTEROSCOPY WITH D & C  8/99  . 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 TONSILLECTOMY    . TRIGGER FINGER RELEASE Left 08/09/2013   Procedure: LEFT A1/A2 PULLEY RELEASE;  Surgeon: Wyn Forster., MD;  Location: [facility];  Service: Orthopedics;  Laterality: Left;  . TUBAL LIGATION  5/77    Current Outpatient Medications  Medication Sig Dispense Refill  . acetaminophen (TYLENOL) 500 MG tablet Take 1,000 mg by mouth every 6 (six) hours as needed for mild pain or fever.    . Cholecalciferol (VITAMIN D3) 5000 UNITS TABS Take 1 tablet by mouth daily.    Marland Kitchen diltiazem (CARDIZEM CD)  180 MG 24 hr capsule TAKE 1 CAPSULE BY MOUTH EVERY DAY 90 capsule 3  . flecainide (TAMBOCOR) 150 MG tablet Take 2 tablets (300 mg total) by mouth daily as needed (AFib episode). 15 tablet 1  . levothyroxine (SYNTHROID, LEVOTHROID) 112 MCG tablet Take 112 mcg by mouth daily before breakfast.     No current facility-administered medications for this visit.    Family History  Problem Relation Age of Onset  . Hypertension Mother   . Kidney disease Mother   . Alzheimer's disease Father   . Diabetes Brother   . Hypertension Brother   . Spina bifida Daughter     Review of Systems  Skin: Positive for wound.  All other systems reviewed and are negative.   Exam:   BP 110/80   Pulse 88   Ht 5\' 8"  (1.727 m)   LMP  07/14/2001   SpO2 98%   BMI 49.17 kg/m   Weight change: @WEIGHTCHANGE @ Height:   Height: 5\' 8"  (172.7 cm)  Ht Readings from Last 3 Encounters:  08/24/20 5\' 8"  (1.727 m)  07/27/20 5\' 8"  (1.727 m)  06/03/20 5\' 8"  (1.727 m)    General appearance: alert, cooperative and appears stated age Head: Normocephalic, without obvious abnormality, atraumatic Neck: no adenopathy, supple, symmetrical, trachea midline and thyroid normal to inspection and palpation Breasts: normal appearance, no masses or tenderness Abdomen: soft, non-tender; non distended,  no masses,  no organomegaly Extremities: extremities normal, atraumatic, no cyanosis or edema Skin: severe rash under her panus and on her upper thighs, erythematous, scaly c/w candida intertrigo Lymph nodes: Cervical, supraclavicular, and axillary nodes normal. No abnormal inguinal nodes palpated Neurologic: Grossly normal   Pelvic: External genitalia:  Mild whitening on the labia minora, some mild loss of architecture, small labia minora. No fissures, plaques or lesions.               Urethra:  normal appearing urethra with no masses, tenderness or lesions              Bartholins and Skenes: normal                 Vagina: normal appearing vagina with normal color and discharge, no lesions. With valsalva she has a grade 1 cystocele, rectocele and uterine prolapse (not examined standing).               Cervix: no lesions               Bimanual Exam:  Uterus:  no masses or tenderness              Adnexa: no mass, fullness, tenderness               Rectovaginal: Confirms               Anus:  normal sphincter tone, no lesions  10/22/20 chaperoned for the exam.  1. Encounter for gynecological examination with abnormal finding Discussed breast self exam Discussed calcium and vit D intake Mammogram due, she will schedule  2. Lichen sclerosus Stable  - clobetasol ointment (TEMOVATE) 0.05 %; Apply as directed twice daily for up to 2 weeks, can  use 1-2 x a week baseline to the vulva as needed.  Dispense: 60 g; Refill: 0  3. Uterine prolapse Tolerable   4. Candidal intertrigo Severe  - ketoconazole (NIZORAL) 2 % cream; Apply 1 application topically 2 (two) times daily. Use BID for 2 weeks  Dispense: 60 g; Refill: 0 -  triamcinolone (KENALOG) 0.025 % ointment; Use BID for 1 week, then q d for one week, then qod for one week.  Dispense: 80 g; Refill: 0  5. Nocturia Only has frequency at night. Discussed trying to not get up unless she feels really full, avoiding drinking for a few hours prior to bed.    At the end of the visit she c/o frequent urination only at night. Voiding every 2 hours. During the day she can go 4 hours. Thinks she is mostly voiding normal amounts.   In addition to the breast and pelvic exam, over 30 minutes was spent in total patient care addressing her multiple other issues.

## 2020-08-31 ENCOUNTER — Other Ambulatory Visit: Payer: Self-pay | Admitting: Cardiovascular Disease

## 2020-10-23 ENCOUNTER — Telehealth: Payer: Self-pay | Admitting: *Deleted

## 2020-10-23 ENCOUNTER — Telehealth: Payer: Self-pay | Admitting: Oncology

## 2020-10-23 NOTE — Telephone Encounter (Signed)
Scheduled appt per 4/11 sch msg. Called pt, no answer. Left msg with appt date and time.  

## 2020-10-23 NOTE — Telephone Encounter (Signed)
Patient called to schedule an appointment with Dr. Clelia Croft, was last seen in March 2021.  Message sent to scheduling.

## 2020-12-01 DIAGNOSIS — Z1231 Encounter for screening mammogram for malignant neoplasm of breast: Secondary | ICD-10-CM | POA: Diagnosis not present

## 2020-12-04 ENCOUNTER — Telehealth: Payer: Self-pay

## 2020-12-04 NOTE — Telephone Encounter (Signed)
Pt called to confirm appt 5/27 at 3:30 with Dr Clelia Croft.

## 2020-12-07 ENCOUNTER — Inpatient Hospital Stay: Payer: Medicare Other | Attending: Oncology | Admitting: Oncology

## 2020-12-07 ENCOUNTER — Other Ambulatory Visit: Payer: Self-pay

## 2020-12-07 ENCOUNTER — Inpatient Hospital Stay: Payer: Medicare Other

## 2020-12-07 ENCOUNTER — Telehealth: Payer: Self-pay | Admitting: *Deleted

## 2020-12-07 VITALS — BP 144/64 | HR 83 | Temp 98.6°F | Resp 25 | Wt 324.4 lb

## 2020-12-07 DIAGNOSIS — D649 Anemia, unspecified: Secondary | ICD-10-CM

## 2020-12-07 DIAGNOSIS — D696 Thrombocytopenia, unspecified: Secondary | ICD-10-CM

## 2020-12-07 DIAGNOSIS — D72821 Monocytosis (symptomatic): Secondary | ICD-10-CM | POA: Diagnosis not present

## 2020-12-07 LAB — CBC WITH DIFFERENTIAL (CANCER CENTER ONLY)
Abs Immature Granulocytes: 0.02 10*3/uL (ref 0.00–0.07)
Basophils Absolute: 0 10*3/uL (ref 0.0–0.1)
Basophils Relative: 0 %
Eosinophils Absolute: 0.2 10*3/uL (ref 0.0–0.5)
Eosinophils Relative: 4 %
HCT: 33.6 % — ABNORMAL LOW (ref 36.0–46.0)
Hemoglobin: 10.6 g/dL — ABNORMAL LOW (ref 12.0–15.0)
Immature Granulocytes: 0 %
Lymphocytes Relative: 41 %
Lymphs Abs: 2.1 10*3/uL (ref 0.7–4.0)
MCH: 28.8 pg (ref 26.0–34.0)
MCHC: 31.5 g/dL (ref 30.0–36.0)
MCV: 91.3 fL (ref 80.0–100.0)
Monocytes Absolute: 1.4 10*3/uL — ABNORMAL HIGH (ref 0.1–1.0)
Monocytes Relative: 27 %
Neutro Abs: 1.4 10*3/uL — ABNORMAL LOW (ref 1.7–7.7)
Neutrophils Relative %: 28 %
Platelet Count: 107 10*3/uL — ABNORMAL LOW (ref 150–400)
RBC: 3.68 MIL/uL — ABNORMAL LOW (ref 3.87–5.11)
RDW: 18.1 % — ABNORMAL HIGH (ref 11.5–15.5)
WBC Count: 5.2 10*3/uL (ref 4.0–10.5)
nRBC: 0 % (ref 0.0–0.2)

## 2020-12-07 LAB — VITAMIN B12: Vitamin B-12: 1008 pg/mL — ABNORMAL HIGH (ref 180–914)

## 2020-12-07 NOTE — Progress Notes (Signed)
Hematology and Oncology Follow Up Visit  Sally Wise [id number] 1946/03/20 75 y.o. 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   Current therapy: Active surveillance.  Interim History: Sally Wise returns today for a follow-up visit.  Since the last visit, she reports few complaints including fatigue tiredness and overall generalized weakness.  She was hospitalized in November 2021 for pneumonia but has recovered from it at this time.  She is not reporting any hematochezia or melena or epistaxis.      Medications: Updated on review. Current Outpatient Medications  Medication Sig Dispense Refill  . acetaminophen (TYLENOL) 500 MG tablet Take 1,000 mg by mouth every 6 (six) hours as needed for mild pain or fever.    . Cholecalciferol (VITAMIN D3) 5000 UNITS TABS Take 1 tablet by mouth daily.    . clobetasol ointment (TEMOVATE) 0.05 % Apply as directed twice daily for up to 2 weeks, can use 1-2 x a week baseline to the vulva as needed. 60 g 0  . diltiazem (CARDIZEM CD) 180 MG 24 hr capsule TAKE 1 CAPSULE BY MOUTH EVERY DAY 90 capsule 3  . flecainide (TAMBOCOR) 150 MG tablet Take 2 tablets (300 mg total) by mouth daily as needed (AFib episode). 15 tablet 1  . ketoconazole (NIZORAL) 2 % cream Apply 1 application topically 2 (two) times daily. Use BID for 2 weeks 60 g 0  . levothyroxine (SYNTHROID, LEVOTHROID) 112 MCG tablet Take 112 mcg by mouth daily before breakfast.    . triamcinolone (KENALOG) 0.025 % ointment Use BID for 1 week, then q d for one week, then qod for one week. 80 g 0   No current facility-administered medications for this visit.     Allergies: No Known Allergies      Physical Exam:    ECOG: 0   General appearance: Comfortable appearing without any discomfort Head: Normocephalic without any  trauma Oropharynx: Mucous membranes are moist and pink without any thrush or ulcers. Eyes: Pupils are equal and round reactive to light. Lymph nodes: No cervical, supraclavicular, inguinal or axillary lymphadenopathy.   Heart:regular rate and rhythm.  S1 and S2 without leg edema. Lung: Clear without any rhonchi or wheezes.  No dullness to percussion. Abdomin: Soft, nontender, nondistended with good bowel sounds.  No hepatosplenomegaly. Musculoskeletal: No joint deformity or effusion.  Full range of motion noted. Neurological: No deficits noted on motor, sensory and deep tendon reflex exam. Skin: No petechial rash or dryness.  Appeared moist.          Lab Results: Lab Results  Component Value Date   WBC 10.5 06/06/2020   HGB 10.5 (L) 06/06/2020   HCT 33.2 (L) 06/06/2020   MCV 91.7 06/06/2020   PLT 165 06/06/2020     Chemistry      Component Value Date/Time   NA 136 06/06/2020 0558   K 4.2 06/06/2020 0558   CL 101 06/06/2020 0558   CO2 25 06/06/2020 0558   BUN 35 (H) 06/06/2020 0558   CREATININE 1.13 (H) 06/06/2020 0558   CREATININE 1.00 10/28/2012 1611      Component Value Date/Time   CALCIUM 9.1 06/06/2020 0558   ALKPHOS 50 06/04/2020 0502   AST 14 (L) 06/04/2020 0502   ALT 12 06/04/2020 0502   BILITOT 0.9 06/04/2020 0502       Impression and Plan:  75 year old woman with:  1. Thrombocytopenia noted back as far as 2008.  Her platelet count has fluctuated as low as 70,000 and occasionally to normal range.   Laboratory data obtained in November 2021 were reviewed and showed enlarged platelets and count close to normal range of 120.  Platelet count on November 24 were 165 which is normal.    Differential diagnosis and management options moving forward were discussed.  A fluctuating nature of her platelets could indicate a reactive thrombocytopenia with element of ITP.  Primary hematological condition such as myelodysplastic syndrome is considered less  likely.  We will update laboratory testing today and inform her about these results in the near future.   2.  Anemia: Her hemoglobin noted to be between 9 and 10 during her hospitalization November 2021.  This could be related to acute infection and we will update anemia work-up on her today.  3.  Monocytosis: Continues to be mild and unchanged   4.  Follow-up: Will be in 6 months unless needs to be seen sooner pending her results.   30  minutes were dedicated to this encounter.  Time was spent on reviewing laboratory data, differential diagnosis and management options for the future.     Eli Hose, MD 5/27/20223:24 PM

## 2020-12-07 NOTE — Telephone Encounter (Signed)
-----   Message from Benjiman Core, MD sent at 12/07/2020  4:03 PM EDT ----- Please let her know that her hgb is improving compared to what it was while she was in the hospital.  Her platelets are also better than her usual baseline.  More results will come back next week and would let her know about those.  I do not see anything worrisome about the blood.

## 2020-12-07 NOTE — Telephone Encounter (Signed)
LM with note below 

## 2020-12-11 ENCOUNTER — Telehealth: Payer: Self-pay | Admitting: *Deleted

## 2020-12-11 ENCOUNTER — Other Ambulatory Visit: Payer: Self-pay | Admitting: Oncology

## 2020-12-11 DIAGNOSIS — D649 Anemia, unspecified: Secondary | ICD-10-CM

## 2020-12-11 LAB — IRON AND TIBC
Iron: 41 ug/dL (ref 41–142)
Saturation Ratios: 17 % — ABNORMAL LOW (ref 21–57)
TIBC: 248 ug/dL (ref 236–444)
UIBC: 206 ug/dL (ref 120–384)

## 2020-12-11 LAB — FERRITIN: Ferritin: 122 ng/mL (ref 11–307)

## 2020-12-11 NOTE — Progress Notes (Signed)
The results of her labs were overall reviewed today and discussed with the patient via phone.  No acute findings noted at this time however her hemoglobin still low but improving.  We will continue to monitor for the time being and repeat her labs in 4 months.

## 2020-12-11 NOTE — Telephone Encounter (Signed)
Returned PC to patient, per Dr. Clelia Croft her September appointments are correct, November appointments cancelled.  Patient verbalizes understanding.

## 2020-12-11 NOTE — Telephone Encounter (Signed)
-----   Message from Benjiman Core, MD sent at 12/11/2020 12:13 PM EDT ----- Leretha Dykes November appointment please. Thanks ----- Message ----- From: Arville Care, RN Sent: 12/11/2020  12:12 PM EDT To: Benjiman Core, MD  This patient called & is asking about her upcoming appointments.  She has lab & MD appointments scheduled in September & then again in November.  According to your note it looks like the November appointments may be a mistake.  Please advise. Thanks, Darel Hong

## 2020-12-12 ENCOUNTER — Telehealth: Payer: Self-pay | Admitting: Oncology

## 2020-12-12 NOTE — Telephone Encounter (Signed)
Scheduled appts per 5/31 sch msg. Pt aware.  

## 2021-01-01 DIAGNOSIS — F411 Generalized anxiety disorder: Secondary | ICD-10-CM | POA: Diagnosis not present

## 2021-01-24 DIAGNOSIS — K5909 Other constipation: Secondary | ICD-10-CM | POA: Diagnosis not present

## 2021-01-24 DIAGNOSIS — R351 Nocturia: Secondary | ICD-10-CM | POA: Diagnosis not present

## 2021-01-24 DIAGNOSIS — N202 Calculus of kidney with calculus of ureter: Secondary | ICD-10-CM | POA: Diagnosis not present

## 2021-01-24 DIAGNOSIS — R8279 Other abnormal findings on microbiological examination of urine: Secondary | ICD-10-CM | POA: Diagnosis not present

## 2021-02-20 DIAGNOSIS — Z86010 Personal history of colonic polyps: Secondary | ICD-10-CM | POA: Diagnosis not present

## 2021-02-20 DIAGNOSIS — R194 Change in bowel habit: Secondary | ICD-10-CM | POA: Diagnosis not present

## 2021-02-21 DIAGNOSIS — F411 Generalized anxiety disorder: Secondary | ICD-10-CM | POA: Diagnosis not present

## 2021-02-21 DIAGNOSIS — R0609 Other forms of dyspnea: Secondary | ICD-10-CM | POA: Diagnosis not present

## 2021-02-21 DIAGNOSIS — M6281 Muscle weakness (generalized): Secondary | ICD-10-CM | POA: Diagnosis not present

## 2021-03-05 ENCOUNTER — Encounter: Payer: Self-pay | Admitting: Neurology

## 2021-03-06 ENCOUNTER — Ambulatory Visit: Payer: Medicare Other | Attending: Family Medicine | Admitting: Physical Therapy

## 2021-03-06 ENCOUNTER — Encounter: Payer: Self-pay | Admitting: Physical Therapy

## 2021-03-06 ENCOUNTER — Other Ambulatory Visit: Payer: Self-pay

## 2021-03-06 DIAGNOSIS — R296 Repeated falls: Secondary | ICD-10-CM | POA: Diagnosis not present

## 2021-03-06 DIAGNOSIS — R2689 Other abnormalities of gait and mobility: Secondary | ICD-10-CM | POA: Diagnosis not present

## 2021-03-06 DIAGNOSIS — M6281 Muscle weakness (generalized): Secondary | ICD-10-CM

## 2021-03-06 NOTE — Patient Instructions (Signed)
Access Code: [email or web address] URL: [email or web address] Date: 03/06/2021 Prepared by: Anabel Halon  Exercises Standing March with Counter Support - 2 x daily - 7 x weekly - 3 sets - 10 reps Heel rises with counter support - 2 x daily - 7 x weekly - 3 sets - 15 reps Standing Hip Abduction with Counter Support - 2 x daily - 7 x weekly - 3 sets - 10 reps Mini Squat with Counter Support - 2 x daily - 7 x weekly - 3 sets - 5 reps Forward Step Up with Unilateral Counter Support - 2 x daily - 7 x weekly - 3 sets - 10 reps

## 2021-03-06 NOTE — Therapy (Addendum)
[facility] [address] [place], [place], [address] Phone: [phone]   Fax:  [phone]  Physical Therapy Evaluation  Patient Details  Name: Sally Wise MRN: [id number] Date of Birth: [birth year] Referring Provider (PT): [name], MD   Encounter Date: 03/06/2021   PT End of Session - 03/06/21 1314     Visit Number 1    Date for PT Re-Evaluation 05/01/21    Authorization Type [employer] medicare    Progress Note Due on Visit 10    PT Start Time 1019    PT Stop Time 1058    PT Time Calculation (min) 39 min    Equipment Utilized During Treatment Gait belt    Activity Tolerance Patient tolerated treatment well    Behavior During Therapy [facility] for tasks assessed/performed             Past Medical History:  Diagnosis Date   Hematuria    evaluation with Dr. Joelyn Oms   Hypothyroidism 06/2010   Kidney stone 41/4239   Lichen sclerosus et atrophicus 2/13   biopsy proven   LVH (left ventricular hypertrophy)    Morbid obesity ([place])    PAF (paroxysmal atrial fibrillation) ([place])    a. Remote hx, re-established care 01/2013 with Dr. Johnsie Cancel for recurrence. 2D echo 01/2013: mod LVH, EF 55-65%, no RWMA, grade 1 d/d. b. 06/2014: started on Xarelto.    Thrombocytopenia ([place])    Vitamin D deficiency 06/2010   Wears glasses     Past Surgical History:  Procedure Laterality Date   BREAST EXCISIONAL BIOPSY  2010   left--was a vascular lesion   CARPAL TUNNEL RELEASE Left 08/09/2013   Procedure: LEFT CARPAL TUNNEL RELEASE;  Surgeon: Cammie Sickle., MD;  Location: [place];  Service: Orthopedics;  Laterality: Left;   CATARACT EXTRACTION     COLONOSCOPY  04/2003       DILATION AND CURETTAGE OF UTERUS     HYSTEROSCOPY WITH D & C  8/99   REPLACEMENT TOTAL KNEE Bilateral 12/2006  07/2007    TONSILLECTOMY     TRIGGER FINGER RELEASE Left 08/09/2013   Procedure: LEFT A1/A2 PULLEY RELEASE;  Surgeon: Cammie Sickle., MD;  Location: [place];  Service: Orthopedics;  Laterality: Left;   TUBAL LIGATION  5/77    There were no vitals filed for this visit.    Subjective Assessment - 03/06/21 1021     Subjective Patient presenting due to generalized weakness. She reports multiple falls with the last one being 3 weeks ago. She states that she feels weak and that Rt LE is weaker than Lt LE. She has a decreased activity tolerance. Patient reports that she fell in the shower approx.1 year ago. She does have grab bars in the shower.    Pertinent History CHF, history of bilateral knee replacements; frequent falls    Limitations Walking;Standing    Currently in Pain? No/denies                [facility] PT Assessment - 03/06/21 0001       Assessment   Medical Diagnosis R29.898 (ICD-10-CM) - Leg weakness    Referring Provider (PT) [name], MD    Hand Dominance Right    Prior Therapy Yes      Precautions   Precautions Fall      Restrictions   Weight Bearing Restrictions No      Balance Screen  Has the patient fallen in the past 6 months Yes    How many times? multiple    Has the patient had a decrease in activity level because of a fear of falling?  Yes    Is the patient reluctant to leave their home because of a fear of falling?  No      Home Environment   Living Environment Private residence    Home Access Stairs to enter   3 bilateral handrails   Home Layout One level      Prior Function   Level of Independence Independent;Independent with basic ADLs;Independent with household mobility without device;Independent with community mobility with device      Cognition   Overall Cognitive Status Within Functional Limits for tasks assessed      Observation/Other Assessments   Focus on Therapeutic Outcomes (FOTO)  41 (goal 55)      Functional Tests   Functional tests Step up;Step down;Sit to Stand      Step Up   Comments heavy bil UE support      Step  Down   Comments impaired eccentric lowering bilaterally, Rt > Lt      Sit to Stand   Comments heavy UE reliance; poor controlled descent      Posture/Postural Control   Posture/Postural Control Postural limitations    Postural Limitations Anterior pelvic tilt;Rounded Shoulders      ROM / Strength   AROM / PROM / Strength Strength      Strength   Overall Strength Comments bil LE 5/5; bil hip flexion 3/5; Rt DF 4/5      Ambulation/Gait   Ambulation/Gait Yes    Ambulation/Gait Assistance 6: Modified independent (Device/Increase time)    Ambulation Distance (Feet) 75 Feet    Assistive device Rolling walker    Gait Pattern Step-through pattern;Trunk flexed;Narrow base of support;Poor foot clearance - left;Poor foot clearance - right    Ambulation Surface Level    Stairs Yes    Stairs Assistance 5: Supervision    Stairs Assistance Details (indicate cue type and reason) heavy UE reliance; cuing for descent using weaker LE with patient demonstrating improved control on descent    Stair Management Technique Two rails;Step to pattern    Number of Stairs 4      Balance   Balance Assessed Yes      Standardized Balance Assessment   Standardized Balance Assessment Timed Up and Go Test;Five Times Sit to Stand    Five times sit to stand comments  13.07 seconds from 21 inch surface      Timed Up and Go Test   Normal TUG (seconds) 37    TUG Comments with RW                        Objective measurements completed on examination: See above findings.               PT Education - 03/06/21 1232     Education Details Access Code: [email or web address]; education on importance of avoiding sedentary lifestyle; education regarding community exercise programs upon D/C    Person(s) Educated Patient    Methods Explanation;Verbal cues;Handout    Comprehension Verbalized understanding;Tactile cues required              PT Short Term Goals - 03/06/21 1306       PT SHORT TERM  GOAL #1   Title Patient will be independent with HEP for continued progression at home.  Time 4    Period Weeks    Status New    Target Date 04/03/21      PT SHORT TERM GOAL #2   Title Patient will complete 6 minute walk test using least restrictive assistive device to indicate improved activity tolerance.    Time 4    Period Weeks    Status New    Target Date 04/03/21      PT SHORT TERM GOAL #3   Title Patient will demo 4/5 bilateral hip flexion for improved foot clearance with ambulation.    Baseline 3+/5 bil    Time 4    Period Weeks    Status New    Target Date 04/03/21               PT Long Term Goals - 03/06/21 1308       PT LONG TERM GOAL #1   Title Patient will be independent with advanced HEP for long term management of symptoms post D/C.    Time 8    Period Weeks    Status New    Target Date 05/01/21      PT LONG TERM GOAL #2   Title Pt will be able to complete sit to stand x5 reps from standard chair using arm rests in 12 seconds or less to indicate decreased fall risk.    Baseline 13.07 seconds from 21 inch surface    Time 8    Period Weeks    Status New    Target Date 05/01/21      PT LONG TERM GOAL #3   Title Patient will ambulate x500 feet using SPC to indicate improved functional mobility.    Time 8    Period Weeks    Status New    Target Date 05/01/21      PT LONG TERM GOAL #4   Title FOTO score will improve to 55 or greater to indicate improved overall function.    Time 8    Period Weeks    Status New    Target Date 05/01/21      PT LONG TERM GOAL #5   Title Patient will ascend/descent 4 steps using one handrail and reciprocal pattern for improved stair negotiation.    Baseline step to pattern; heavy bil UE reliance    Time 8    Period Weeks    Status New    Target Date 05/01/21                    Plan - 03/06/21 1232     Clinical Impression Statement Patient is a 75 y/o female referred due to generalized bil LE  weakness. PMH includes CHF, frequent falls, and history of bil TKA. Patient reported activity limitations include prolonged standing, housework, and functional transfers. She demonstrates bil LE strength impairments as bil hip flexion 3+/5. Functional mobility impairments apparent as patient demos poor controlled descent when performing sit to stand. Patient requiring 13.07 seconds to complete five times sit to stand from elevated surface indicating increased fall risk. Fall risk further indicated by patient requiring >35 seconds to complete TUG. She demos heavy UE reliance with stair negotiation and require cuing for safe negotiation. Patient would benefit from skilled therapeutic intervention to address impairments for decreased fall risk and improved functional mobility.    Personal Factors and Comorbidities Comorbidity 3+    Comorbidities CHF, frequent falls, history of bil TKA    Examination-Activity Limitations Transfers;Locomotion Level  Stability/Clinical Decision Making Stable/Uncomplicated    Clinical Decision Making Low    Rehab Potential Excellent    PT Frequency 2x / week    PT Duration 8 weeks    PT Treatment/Interventions ADLs/Self Care Home Management;Cryotherapy;Electrical Stimulation;Moist Heat;Gait training;Stair training;Functional mobility training;Therapeutic activities;Therapeutic exercise;Balance training;Neuromuscular re-education;Patient/family education    PT Next Visit Plan review HEP; complete 6 minute walk test or 2 minute walk test based on patient presentation; functional transfers; hip strengthening    PT Home Exercise Plan Access Code [email or web address]    Consulted and Agree with Plan of Care Patient             Patient will benefit from skilled therapeutic intervention in order to improve the following deficits and impairments:  Abnormal gait, Decreased endurance, Decreased activity tolerance, Decreased balance, Decreased safety awareness, Decreased strength,  Difficulty walking, Improper body mechanics, Postural dysfunction, Obesity  Visit Diagnosis: Muscle weakness (generalized) - Plan: PT plan of care cert/re-cert  Other abnormalities of gait and mobility - Plan: PT plan of care cert/re-cert  Repeated falls - Plan: PT plan of care cert/re-cert     Problem List Patient Active Problem List   Diagnosis Date Noted   Acute on chronic diastolic HF (heart failure) ([place]) 08/24/2020   Body mass index (BMI) 50.0-59.9, adult ([place]) 08/24/2020   Chronic kidney disease with active medical management without dialysis, stage 3 (moderate) ([place]) 08/24/2020   Combined rheumatic disorders of mitral, 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   Congestive rheumatic heart failure ([place]) 08/24/2020   Hardening of the aorta (main artery of the heart) ([place]) 08/24/2020   Kidney stone 08/24/2020   Pure hypercholesterolemia 08/24/2020   Scoliosis of thoracic spine 08/24/2020   Solitary pulmonary nodule 08/24/2020   Other long term (current) drug therapy 08/24/2020   Abnormal findings on diagnostic imaging of other specified body structures 08/24/2020   Acute respiratory failure with hypoxia ([place]) 06/04/2020   Multifocal pneumonia 06/03/2020   Low back pain 12/12/2019   History of total knee replacement, right 42/87/6811   Lichen sclerosus et atrophicus 01/04/2015   LVH (left ventricular hypertrophy)    PAF (paroxysmal atrial fibrillation) ([place]) 07/06/2014   Thrombocytopenia ([place]) 07/06/2014   Hyperglycemia 07/06/2014   Morbid obesity ([place]) 01/25/2013   Hypothyroidism 01/15/2013   History of total knee replacement, left 08/09/2007   Everardo All PT, DPT  03/06/21 1:17 PM  PHYSICAL THERAPY DISCHARGE SUMMARY  Visits from Start of Care: 1  Current functional level related to goals / functional outcomes: See above for most current PT status.  Pt didn't return to PT.    Remaining deficits: See above    Education / Equipment: HEP   Patient agrees to  discharge. Patient goals were not met. Patient is being discharged due to not returning since the last visit.  Sigurd Sos, PT 05/01/21 2:33 PM   [facility] [address], [place], [place], [address] Phone: [phone]   Fax:  [phone]  Name: Sally Wise MRN: [id number] Date of Birth: Nov 22, 1945

## 2021-03-14 ENCOUNTER — Ambulatory Visit: Payer: Medicare Other | Admitting: Physical Therapy

## 2021-03-14 ENCOUNTER — Other Ambulatory Visit: Payer: Self-pay

## 2021-03-14 ENCOUNTER — Inpatient Hospital Stay (HOSPITAL_COMMUNITY)
Admission: EM | Admit: 2021-03-14 | Discharge: 2021-03-17 | DRG: 372 | Disposition: A | Discharge status: Home / Self Care | Payer: Medicare Other | Attending: Student | Admitting: Student

## 2021-03-14 DIAGNOSIS — I480 Paroxysmal atrial fibrillation: Secondary | ICD-10-CM | POA: Diagnosis present

## 2021-03-14 DIAGNOSIS — E7800 Pure hypercholesterolemia, unspecified: Secondary | ICD-10-CM | POA: Diagnosis not present

## 2021-03-14 DIAGNOSIS — Z79890 Hormone replacement therapy (postmenopausal): Secondary | ICD-10-CM | POA: Diagnosis not present

## 2021-03-14 DIAGNOSIS — N200 Calculus of kidney: Secondary | ICD-10-CM | POA: Diagnosis not present

## 2021-03-14 DIAGNOSIS — Z8249 Family history of ischemic heart disease and other diseases of the circulatory system: Secondary | ICD-10-CM | POA: Diagnosis not present

## 2021-03-14 DIAGNOSIS — Z833 Family history of diabetes mellitus: Secondary | ICD-10-CM | POA: Diagnosis not present

## 2021-03-14 DIAGNOSIS — E860 Dehydration: Secondary | ICD-10-CM | POA: Diagnosis present

## 2021-03-14 DIAGNOSIS — Z20822 Contact with and (suspected) exposure to covid-19: Secondary | ICD-10-CM | POA: Diagnosis present

## 2021-03-14 DIAGNOSIS — D696 Thrombocytopenia, unspecified: Secondary | ICD-10-CM | POA: Diagnosis not present

## 2021-03-14 DIAGNOSIS — E039 Hypothyroidism, unspecified: Secondary | ICD-10-CM | POA: Diagnosis present

## 2021-03-14 DIAGNOSIS — R911 Solitary pulmonary nodule: Secondary | ICD-10-CM | POA: Diagnosis present

## 2021-03-14 DIAGNOSIS — D631 Anemia in chronic kidney disease: Secondary | ICD-10-CM | POA: Diagnosis present

## 2021-03-14 DIAGNOSIS — I5032 Chronic diastolic (congestive) heart failure: Secondary | ICD-10-CM | POA: Diagnosis present

## 2021-03-14 DIAGNOSIS — D693 Immune thrombocytopenic purpura: Secondary | ICD-10-CM | POA: Diagnosis not present

## 2021-03-14 DIAGNOSIS — I130 Hypertensive heart and chronic kidney disease with heart failure and stage 1 through stage 4 chronic kidney disease, or unspecified chronic kidney disease: Secondary | ICD-10-CM | POA: Diagnosis present

## 2021-03-14 DIAGNOSIS — Z820 Family history of epilepsy and other diseases of the nervous system: Secondary | ICD-10-CM | POA: Diagnosis not present

## 2021-03-14 DIAGNOSIS — R531 Weakness: Secondary | ICD-10-CM | POA: Diagnosis not present

## 2021-03-14 DIAGNOSIS — R1084 Generalized abdominal pain: Secondary | ICD-10-CM | POA: Diagnosis not present

## 2021-03-14 DIAGNOSIS — F32A Depression, unspecified: Secondary | ICD-10-CM | POA: Diagnosis not present

## 2021-03-14 DIAGNOSIS — Z79899 Other long term (current) drug therapy: Secondary | ICD-10-CM | POA: Diagnosis not present

## 2021-03-14 DIAGNOSIS — K529 Noninfective gastroenteritis and colitis, unspecified: Secondary | ICD-10-CM | POA: Diagnosis present

## 2021-03-14 DIAGNOSIS — F419 Anxiety disorder, unspecified: Secondary | ICD-10-CM | POA: Diagnosis present

## 2021-03-14 DIAGNOSIS — Z841 Family history of disorders of kidney and ureter: Secondary | ICD-10-CM | POA: Diagnosis not present

## 2021-03-14 DIAGNOSIS — Z6841 Body Mass Index (BMI) 40.0 and over, adult: Secondary | ICD-10-CM

## 2021-03-14 DIAGNOSIS — K8020 Calculus of gallbladder without cholecystitis without obstruction: Secondary | ICD-10-CM | POA: Diagnosis not present

## 2021-03-14 DIAGNOSIS — Z96653 Presence of artificial knee joint, bilateral: Secondary | ICD-10-CM | POA: Diagnosis not present

## 2021-03-14 DIAGNOSIS — I517 Cardiomegaly: Secondary | ICD-10-CM | POA: Diagnosis not present

## 2021-03-14 DIAGNOSIS — N1831 Chronic kidney disease, stage 3a: Secondary | ICD-10-CM | POA: Diagnosis not present

## 2021-03-14 DIAGNOSIS — R197 Diarrhea, unspecified: Secondary | ICD-10-CM | POA: Diagnosis not present

## 2021-03-14 DIAGNOSIS — A040 Enteropathogenic Escherichia coli infection: Secondary | ICD-10-CM | POA: Diagnosis not present

## 2021-03-14 DIAGNOSIS — A044 Other intestinal Escherichia coli infections: Secondary | ICD-10-CM | POA: Diagnosis not present

## 2021-03-14 LAB — RESP PANEL BY RT-PCR (FLU A&B, COVID) ARPGX2
Influenza A by PCR: NEGATIVE
Influenza B by PCR: NEGATIVE
SARS Coronavirus 2 by RT PCR: NEGATIVE

## 2021-03-14 LAB — CBC WITH DIFFERENTIAL/PLATELET
Abs Immature Granulocytes: 0.05 10*3/uL (ref 0.00–0.07)
Basophils Absolute: 0 10*3/uL (ref 0.0–0.1)
Basophils Relative: 0 %
Eosinophils Absolute: 0.4 10*3/uL (ref 0.0–0.5)
Eosinophils Relative: 7 %
HCT: 33.5 % — ABNORMAL LOW (ref 36.0–46.0)
Hemoglobin: 10.4 g/dL — ABNORMAL LOW (ref 12.0–15.0)
Immature Granulocytes: 1 %
Lymphocytes Relative: 22 %
Lymphs Abs: 1.2 10*3/uL (ref 0.7–4.0)
MCH: 30.1 pg (ref 26.0–34.0)
MCHC: 31 g/dL (ref 30.0–36.0)
MCV: 96.8 fL (ref 80.0–100.0)
Monocytes Absolute: 1.2 10*3/uL — ABNORMAL HIGH (ref 0.1–1.0)
Monocytes Relative: 22 %
Neutro Abs: 2.6 10*3/uL (ref 1.7–7.7)
Neutrophils Relative %: 48 %
Platelets: 116 10*3/uL — ABNORMAL LOW (ref 150–400)
RBC: 3.46 MIL/uL — ABNORMAL LOW (ref 3.87–5.11)
RDW: 19.1 % — ABNORMAL HIGH (ref 11.5–15.5)
WBC: 5.5 10*3/uL (ref 4.0–10.5)
nRBC: 0 % (ref 0.0–0.2)

## 2021-03-14 LAB — CREATININE, SERUM
Creatinine, Ser: 1.16 mg/dL — ABNORMAL HIGH (ref 0.44–1.00)
GFR, Estimated: 49 mL/min — ABNORMAL LOW (ref >60)

## 2021-03-14 LAB — COMPREHENSIVE METABOLIC PANEL
ALT: 8 U/L (ref 0–44)
AST: 11 U/L — ABNORMAL LOW (ref 15–41)
Albumin: 3.3 g/dL — ABNORMAL LOW (ref 3.5–5.0)
Alkaline Phosphatase: 164 U/L — ABNORMAL HIGH (ref 38–126)
Anion gap: 10 (ref 5–15)
BUN: 20 mg/dL (ref 8–23)
CO2: 22 mmol/L (ref 22–32)
Calcium: 8.6 mg/dL — ABNORMAL LOW (ref 8.9–10.3)
Chloride: 107 mmol/L (ref 98–111)
Creatinine, Ser: 1.19 mg/dL — ABNORMAL HIGH (ref 0.44–1.00)
GFR, Estimated: 47 mL/min — ABNORMAL LOW (ref >60)
Glucose, Bld: 109 mg/dL — ABNORMAL HIGH (ref 70–99)
Potassium: 3.5 mmol/L (ref 3.5–5.1)
Sodium: 139 mmol/L (ref 135–145)
Total Bilirubin: 0.5 mg/dL (ref 0.3–1.2)
Total Protein: 6.9 g/dL (ref 6.5–8.1)

## 2021-03-14 LAB — CBC
HCT: 33.2 % — ABNORMAL LOW (ref 36.0–46.0)
Hemoglobin: 10.6 g/dL — ABNORMAL LOW (ref 12.0–15.0)
MCH: 29.9 pg (ref 26.0–34.0)
MCHC: 31.9 g/dL (ref 30.0–36.0)
MCV: 93.5 fL (ref 80.0–100.0)
Platelets: 120 10*3/uL — ABNORMAL LOW (ref 150–400)
RBC: 3.55 MIL/uL — ABNORMAL LOW (ref 3.87–5.11)
RDW: 19.1 % — ABNORMAL HIGH (ref 11.5–15.5)
WBC: 5.4 10*3/uL (ref 4.0–10.5)
nRBC: 0 % (ref 0.0–0.2)

## 2021-03-14 LAB — MAGNESIUM: Magnesium: 2.2 mg/dL (ref 1.7–2.4)

## 2021-03-14 MED ORDER — LACTATED RINGERS IV SOLN: INTRAVENOUS | Status: DC

## 2021-03-14 MED ORDER — BUSPIRONE HCL 5 MG PO TABS
15.0000 mg | ORAL_TABLET | Freq: Once | ORAL | Status: AC
  Administered 2021-03-14: 15 mg via ORAL
  Filled 2021-03-14: qty 3

## 2021-03-14 MED ORDER — LEVOTHYROXINE SODIUM 112 MCG PO TABS
112.0000 ug | ORAL_TABLET | Freq: Every day | ORAL | Status: DC
  Administered 2021-03-15 – 2021-03-17 (×3): 112 ug via ORAL
  Filled 2021-03-14 (×3): qty 1

## 2021-03-14 MED ORDER — ONDANSETRON HCL 4 MG/2ML IJ SOLN: 4.0000 mg | Freq: Four times a day (QID) | INTRAMUSCULAR | Status: DC | PRN

## 2021-03-14 MED ORDER — ACETAMINOPHEN 650 MG RE SUPP: 650.0000 mg | Freq: Four times a day (QID) | RECTAL | Status: DC | PRN

## 2021-03-14 MED ORDER — ONDANSETRON HCL 4 MG PO TABS: 4.0000 mg | ORAL_TABLET | Freq: Four times a day (QID) | ORAL | Status: DC | PRN

## 2021-03-14 MED ORDER — VITAMIN D 25 MCG (1000 UNIT) PO TABS
5000.0000 [IU] | ORAL_TABLET | Freq: Every day | ORAL | Status: DC
  Administered 2021-03-15 – 2021-03-17 (×3): 5000 [IU] via ORAL
  Filled 2021-03-14 (×3): qty 5

## 2021-03-14 MED ORDER — ENOXAPARIN SODIUM 60 MG/0.6ML IJ SOSY
60.0000 mg | PREFILLED_SYRINGE | INTRAMUSCULAR | Status: DC
  Administered 2021-03-14 – 2021-03-16 (×3): 60 mg via SUBCUTANEOUS
  Filled 2021-03-14 (×4): qty 0.6

## 2021-03-14 MED ORDER — ACETAMINOPHEN 325 MG PO TABS
650.0000 mg | ORAL_TABLET | Freq: Four times a day (QID) | ORAL | Status: DC | PRN
  Administered 2021-03-14: 650 mg via ORAL
  Filled 2021-03-14: qty 2

## 2021-03-14 NOTE — H&P (Signed)
History and Physical    Sally Wise MBW:[id number] DOB: May 24, 1946 DOA: 03/14/2021  PCP: [name], MD   Patient coming from:   Chief Complaint  Patient presents with   Diarrhea   Nausea   Dehydration      HPI: Sally Wise is a 75 y.o. female with medical history significant for history of atrial fibrillation, morbid obesity, hypothyroidism, LVH, anemia and thrombocytopenia brought by EMS due to ongoing frequent mucoid/loose bowel movement at home.  For last more than a week patient has been having multiple episodes of spontaneous leakage of clear mucousy substance from her rectum, spontaneously although she may have initial urge to defecate then had incontinence and has been spending a lot of time in the toilet and feels weak and dizzy.  Every time she tries to eat she feels somewhat bloated and gets this episode.  She called her doctor Dr. Cristina Gong was plan to see her today she is due for colonoscopy September 15. EMS was called, she was dizzy understanding blood pressure was 100/60 given 350 mL IV fluids brought to the ED. Patient otherwise denies any nausea, vomiting, chest pain, shortness of breath, fever, chills, headache, focal weakness, numbness tingling, speech difficulties   ED Course: BP stable 119-137, HR stable, on RA, labs stable chronic anemia, thrombocytopenia. Give her age, dehydration weakness, admission requested.  Assessment/Plan  Mucoid frequent bowel movement/?Diarrhea: Ongoing for  > 7 DAYS with weakness and dizziness blood pressure at 100 for EMS.  Symptoms worsened by oral intake.  Blood work shows stable renal function.  C. difficile and GI panel COVID-19 pending.  We will keep on interview precaution until then continue gentle IV fluid hydration clear liquid diet and advance as tolerated.  If still persistent will benefit with [facility] GI consultation tomorrow.  Weakness/Dizziness per EMS. Check OV, Cont ivf.  Getting orthostatic vitals in the ED.  PT  OT eval  Mildly elevated alk phos -isolated, ast/alt normal. Monitor  Hypothyroidism: Continue her Synthroid.  Need TSH checked by PCP  Morbid obesity w/ BMI 45.6: Will benefit with weight loss, healthy lifestyle  PAF on Cardizem and flecainide.  Hold Cardizem due to soft blood pressure.  Continue flecainide question if she takes daily versus as needed, pharmacy consulted to resume home dose. Med rec pending. patient reports she follows with [facility]," reports She is not placed on anticoagulation AS "A. fib is not bad and happened MANY years ago" .  Chronic Thrombocytopenia.  Monitor. Chronic anemia likely from chronic disease.  Monitor  Body mass index is 45.61 kg/m.   Severity of Illness: Observation status  DVT prophylaxis: enoxaparin (LOVENOX) injection 40 mg Start: 03/14/21 1800 SCDs Start: 03/14/21 1754  Code Status:   Code Status: Full Code  Family Communication: Admission, patients condition and plan of care including tests being ordered have been discussed with the patient and her husband  who indicate understanding and agree with the plan and Code Status.  Consults called:  None  Review of Systems: All systems were reviewed and were negative except as mentioned in HPI above. Negative for fever Negative for chest pain Negative for shortness of breath  Past Medical History:  Diagnosis Date   Hematuria    evaluation with Dr. Joelyn Oms   Hypothyroidism 06/2010   Kidney stone 07/7791   Lichen sclerosus et atrophicus 2/13   biopsy proven   LVH (left ventricular hypertrophy)    Morbid obesity ([facility])    PAF (paroxysmal atrial fibrillation) ([place])  a. Remote hx, re-established care 01/2013 with Dr. Johnsie Cancel for recurrence. 2D echo 01/2013: mod LVH, EF 55-65%, no RWMA, grade 1 d/d. b. 06/2014: started on Xarelto.    Thrombocytopenia ([place])    Vitamin D deficiency 06/2010   Wears glasses     Past Surgical History:  Procedure Laterality Date   BREAST EXCISIONAL  BIOPSY  2010   left--was a vascular lesion   CARPAL TUNNEL RELEASE Left 08/09/2013   Procedure: LEFT CARPAL TUNNEL RELEASE;  Surgeon: Cammie Sickle., MD;  Location: [facility];  Service: Orthopedics;  Laterality: Left;   CATARACT EXTRACTION     COLONOSCOPY  04/2003       DILATION AND CURETTAGE OF UTERUS     HYSTEROSCOPY WITH D & C  8/99   REPLACEMENT TOTAL KNEE Bilateral 12/2006  07/2007    TONSILLECTOMY     TRIGGER FINGER RELEASE Left 08/09/2013   Procedure: LEFT A1/A2 PULLEY RELEASE;  Surgeon: Cammie Sickle., MD;  Location: [facility];  Service: Orthopedics;  Laterality: Left;   TUBAL LIGATION  5/77     reports that she has never smoked. She has never used smokeless tobacco. She reports that she does not drink alcohol and does not use drugs.  No Known Allergies  Family History  Problem Relation Age of Onset   Hypertension Mother    Kidney disease Mother    Alzheimer's disease Father    Diabetes Brother    Hypertension Brother    Spina bifida Daughter      Prior to Admission medications   Medication Sig Start Date End Date Taking? Authorizing Provider  acetaminophen (TYLENOL) 500 MG tablet Take 1,000 mg by mouth every 6 (six) hours as needed for mild pain or fever.    [provider]  Cholecalciferol (VITAMIN D3) 5000 UNITS TABS Take 1 tablet by mouth daily.    [provider]  clobetasol ointment (TEMOVATE) 0.05 % Apply as directed twice daily for up to 2 weeks, can use 1-2 x a week baseline to the vulva as needed. 08/24/20   Salvadore Dom, MD  diltiazem [facility] CD) 180 MG 24 hr capsule TAKE 1 CAPSULE BY MOUTH EVERY DAY 08/31/20   Josue Hector, MD  flecainide (TAMBOCOR) 150 MG tablet Take 2 tablets (300 mg total) by mouth daily as needed (AFib episode). 05/08/17   Josue Hector, MD  ketoconazole (NIZORAL) 2 % cream Apply 1 application topically 2 (two) times daily. Use BID for 2 weeks 08/24/20   Salvadore Dom, MD  levothyroxine (SYNTHROID, LEVOTHROID) 112 MCG tablet Take 112 mcg by mouth daily before breakfast.    [provider]  LORazepam (ATIVAN) 1 MG tablet take 1 tablet by oral route 30 min prior to MRI. Repeat if necessary 01/23/20   [provider]  triamcinolone (KENALOG) 0.025 % ointment Use BID for 1 week, then q d for one week, then qod for one week. 08/24/20   Salvadore Dom, MD    Physical Exam: Vitals:   03/14/21 1539 03/14/21 1541 03/14/21 1630  BP:  119/72 137/70  Pulse:  75 63  Resp:  16 19  Temp:  98.3 F (36.8 C)   TempSrc:  Oral   SpO2:  98% 98%  Weight: 136.1 kg    Height: 5' 8"  (1.727 m)      General exam: AAO x3, obese, pleasant not in distress or discomfort. HEENT:Oral mucosa moist, Ear/Nose WNL grossly, dentition normal.  Respiratory system: bilaterally clear breath sounds,no wheezing or crackles,no use of accessory muscle Cardiovascular system: S1 & S2 +, No JVD,. Gastrointestinal system: Abdomen soft,obese,NT,ND, BS+ Nervous System:Alert, awake, moving extremities and grossly nonfocal Extremities: No edema, distal peripheral pulses palpable.  Skin: No rashes,no icterus. MSK: Normal muscle bulk,tone, power   Labs on Admission: I have personally reviewed following labs and imaging studies  CBC: Recent Labs  Lab 03/14/21 1609  WBC 5.5  NEUTROABS 2.6  HGB 10.4*  HCT 33.5*  MCV 96.8  PLT 762*   Basic Metabolic Panel: Recent Labs  Lab 03/14/21 1609  NA 139  K 3.5  CL 107  CO2 22  GLUCOSE 109*  BUN 20  CREATININE 1.19*  CALCIUM 8.6*  MG 2.2   GFR: Estimated Creatinine Clearance: 58 mL/min (A) (by C-G formula based on SCr of 1.19 mg/dL (H)). Liver Function Tests: Recent Labs  Lab 03/14/21 1609  AST 11*  ALT 8  ALKPHOS 164*  BILITOT 0.5  PROT 6.9  ALBUMIN 3.3*   No results for input(s): LIPASE, AMYLASE in the last 168 hours. No results for input(s): AMMONIA in the last 168 hours. Coagulation Profile: No  results for input(s): INR, PROTIME in the last 168 hours. Cardiac Enzymes: No results for input(s): CKTOTAL, CKMB, CKMBINDEX, TROPONINI in the last 168 hours. BNP (last 3 results) No results for input(s): PROBNP in the last 8760 hours. HbA1C: No results for input(s): HGBA1C in the last 72 hours. CBG: No results for input(s): GLUCAP in the last 168 hours. Lipid Profile: No results for input(s): CHOL, HDL, LDLCALC, TRIG, CHOLHDL, LDLDIRECT in the last 72 hours. Thyroid Function Tests: No results for input(s): TSH, T4TOTAL, FREET4, T3FREE, THYROIDAB in the last 72 hours. Anemia Panel: No results for input(s): VITAMINB12, FOLATE, FERRITIN, TIBC, IRON, RETICCTPCT in the last 72 hours. Urine analysis:    Component Value Date/Time   COLORURINE GREEN (A) 06/05/2020 1800   APPEARANCEUR CLEAR 06/05/2020 1800   LABSPEC 1.015 06/05/2020 1800   PHURINE 5.5 06/05/2020 1800   GLUCOSEU NEGATIVE 06/05/2020 1800   HGBUR TRACE (A) 06/05/2020 1800   BILIRUBINUR NEGATIVE 06/05/2020 1800   BILIRUBINUR neg 04/08/2016 0916   KETONESUR NEGATIVE 06/05/2020 1800   PROTEINUR NEGATIVE 06/05/2020 1800   UROBILINOGEN negative 04/08/2016 0916   UROBILINOGEN 0.2 01/15/2013 1538   NITRITE NEGATIVE 06/05/2020 1800   LEUKOCYTESUR NEGATIVE 06/05/2020 1800    Radiological Exams on Admission: No results found.    Antonieta Pert MD [facility]  If 7PM-7AM, please contact night-coverage [email or web address]  03/14/2021, 5:59 PM

## 2021-03-14 NOTE — ED Triage Notes (Signed)
Pt to ED via EMS from home c/o dehydration. Over the past day pt has been experiencing diarrhea, increased weakness, intermittent nausea, and decreased oral intake. Marland Kitchen No fever. Positive orthostatic . Initial bp 100/60, HR 70S, Pt c/o feeling dizzy/weak with standing. Hx A fib. #18 LAC , 350 ML fluid bolus given by EMS. Last VS: CBG 141, rr 18, 98%RA. , 112/72.

## 2021-03-14 NOTE — ED Provider Notes (Addendum)
[facility]-EMERGENCY DEPT Provider Note   CSN: [id number] Arrival date & time: 03/14/21  1528     History Chief Complaint  Patient presents with  . Diarrhea  . Nausea  . Dehydration    S[facility] is a 75 y.o. female.  Established with [facility].  Dates that for the past 10 days, she has had spontaneous leakage of a clear, mucousy substance from her rectum.  It happens spontaneously, and though she may have initial urge to defecate, she has had incontinence from this substance.  As a result, she has spent a lot of time on the toilet, and she has felt very weak.  EMS was called, and noted that she was orthostatic with initial blood pressure 100/60.  She was given 350 cc of fluid.  Today she started to have a little bit of diarrhea, but otherwise she has had scant stools in addition to this clear rectal discharge.  The history is provided by the patient.  Diarrhea Diarrhea characteristics: clear liquid- "almost like KY jelly but thinner" Severity:  Severe Onset quality:  Gradual Number of episodes:  Too numerous to count Duration:  10 days Timing:  Intermittent Progression:  Unchanged Relieved by:  Nothing Exacerbated by: eating. Ineffective treatments:  None tried Associated symptoms: no abdominal pain (feels bloated after eating), no arthralgias, no chills, no recent cough, no diaphoresis, no fever, no headaches, no myalgias and no vomiting   Risk factors: no recent antibiotic use (had a UTI a few months ago)       Past Medical History:  Diagnosis Date  . Hematuria    evaluation with Dr. Etta Grandchild  . Hypothyroidism 06/2010  . Kidney stone 04/2004  . Lichen sclerosus et atrophicus 2/13   biopsy proven  . LVH (left ventricular hypertrophy)   . Morbid obesity (HCC)   . PAF (paroxysmal atrial fibrillation) (HCC)    a. Remote hx, re-established care 01/2013 with Dr. Eden Emms for recurrence. 2D echo 01/2013: mod LVH, EF 55-65%, no RWMA, grade 1 d/d. b.  06/2014: started on Xarelto.   . Thrombocytopenia (HCC)   . Vitamin D deficiency 06/2010  . Wears glasses     Patient Active Problem List   Diagnosis Date Noted  . Acute on chronic diastolic HF (heart failure) (HCC) 08/24/2020  . Body mass index (BMI) 50.0-59.9, adult (HCC) 08/24/2020  . Chronic kidney disease with active medical management without dialysis, stage 3 (moderate) (HCC) 08/24/2020  . Combined rheumatic disorders of mitral, 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  . Congestive rheumatic heart failure (HCC) 08/24/2020  . Hardening of the aorta (main artery of the heart) (HCC) 08/24/2020  . Kidney stone 08/24/2020  . Pure hypercholesterolemia 08/24/2020  . Scoliosis of thoracic spine 08/24/2020  . Solitary pulmonary nodule 08/24/2020  . Other long term (current) drug therapy 08/24/2020  . Abnormal findings on diagnostic imaging of other specified body structures 08/24/2020  . Acute respiratory failure with hypoxia (HCC) 06/04/2020  . Multifocal pneumonia 06/03/2020  . Low back pain 12/12/2019  . 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 Lichen sclerosus et atrophicus 01/04/2015  . LVH (left ventricular hypertrophy)   . PAF (paroxysmal atrial fibrillation) (HCC) 07/06/2014  . Thrombocytopenia (HCC) 07/06/2014  . Hyperglycemia 07/06/2014  . Morbid obesity (HCC) 01/25/2013  . Hypothyroidism 01/15/2013  . History of total knee replacement, left 08/09/2007    Past Surgical History:  Procedure Laterality Date  . BREAST EXCISIONAL BIOPSY  2010   left--was a  vascular lesion  . CARPAL TUNNEL RELEASE Left 08/09/2013   Procedure: LEFT CARPAL TUNNEL RELEASE;  Surgeon: Wyn Forster., MD;  Location: [facility];  Service: Orthopedics;  Laterality: Left;  . CATARACT EXTRACTION    . COLONOSCOPY  04/2003      . DILATION AND CURETTAGE OF UTERUS    . HYSTEROSCOPY WITH D & C  8/99  . 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 TONSILLECTOMY     . TRIGGER FINGER RELEASE Left 08/09/2013   Procedure: LEFT A1/A2 PULLEY RELEASE;  Surgeon: Wyn Forster., MD;  Location: [facility];  Service: Orthopedics;  Laterality: Left;  . TUBAL LIGATION  5/77     OB History     Gravida  2   Para  2   Term  2   Preterm  0   AB  0   Living  2      SAB  0   IAB  0   Ectopic  0   Multiple  0   Live Births  2           Family History  Problem Relation Age of Onset  . Hypertension Mother   . Kidney disease Mother   . Alzheimer's disease Father   . Diabetes Brother   . Hypertension Brother   . Spina bifida Daughter     Social History   Tobacco Use  . Smoking status: Never  . Smokeless tobacco: Never  Vaping Use  . Vaping Use: Never used  Substance Use Topics  . Alcohol use: No  . Drug use: No    Home Medications Prior to Admission medications   Medication Sig Start Date End Date Taking? Authorizing Provider  acetaminophen (TYLENOL) 500 MG tablet Take 1,000 mg by mouth every 6 (six) hours as needed for mild pain or fever.    [provider]  Cholecalciferol (VITAMIN D3) 5000 UNITS TABS Take 1 tablet by mouth daily.    [provider]  clobetasol ointment (TEMOVATE) 0.05 % Apply as directed twice daily for up to 2 weeks, can use 1-2 x a week baseline to the vulva as needed. 08/24/20   Romualdo Bolk, MD  diltiazem [facility] CD) 180 MG 24 hr capsule TAKE 1 CAPSULE BY MOUTH EVERY DAY 08/31/20   Wendall Stade, MD  flecainide (TAMBOCOR) 150 MG tablet Take 2 tablets (300 mg total) by mouth daily as needed (AFib episode). 05/08/17   Wendall Stade, MD  ketoconazole (NIZORAL) 2 % cream Apply 1 application topically 2 (two) times daily. Use BID for 2 weeks 08/24/20   Romualdo Bolk, MD  levothyroxine (SYNTHROID, LEVOTHROID) 112 MCG tablet Take 112 mcg by mouth daily before breakfast.    [provider]  LORazepam (ATIVAN) 1 MG tablet take 1 tablet by oral route 30  min prior to MRI. Repeat if necessary 01/23/20   [provider]  triamcinolone (KENALOG) 0.025 % ointment Use BID for 1 week, then q d for one week, then qod for one week. 08/24/20   Romualdo Bolk, MD    Allergies    Patient has no known allergies.  Review of Systems   Review of Systems  Constitutional:  Negative for chills, diaphoresis and fever.  HENT:  Negative for ear pain and sore throat.   Eyes:  Negative for pain and visual disturbance.  Respiratory:  Negative for cough and shortness of breath.   Cardiovascular:  Negative for chest pain and palpitations.  Gastrointestinal:  Positive for diarrhea and nausea. Negative for abdominal pain (feels bloated after eating), blood in stool and vomiting.  Genitourinary:  Negative for dysuria and hematuria.  Musculoskeletal:  Negative for arthralgias, back pain and myalgias.  Skin:  Negative for color change and rash.  Neurological:  Negative for seizures, syncope and headaches.  All other systems reviewed and are negative.  Physical Exam Updated Vital Signs BP 119/72 (BP Location: Right Arm)   Pulse 75   Temp 98.3 F (36.8 C) (Oral)   Resp 16   Ht 5\' 8"  (1.727 m)   Wt 136.1 kg   LMP 07/14/2001   SpO2 98%   BMI 45.61 kg/m   Physical Exam Vitals and nursing note reviewed.  Constitutional:      Appearance: She is well-developed.  HENT:     Head: Normocephalic and atraumatic.  Cardiovascular:     Rate and Rhythm: Normal rate and regular rhythm.     Heart sounds: Normal heart sounds.  Pulmonary:     Effort: Pulmonary effort is normal. No tachypnea.     Breath sounds: Normal breath sounds.  Abdominal:     Palpations: Abdomen is soft.     Tenderness: There is no abdominal tenderness.  Genitourinary:    Rectum: Normal.     Comments: Brown stool No obvious discharge Musculoskeletal:     Right lower leg: No edema.     Left lower leg: No edema.  Skin:    General: Skin is warm and dry.  Neurological:      General: No focal deficit present.     Mental Status: She is alert and oriented to person, place, and time.  Psychiatric:        Mood and Affect: Mood normal.        Behavior: Behavior normal.    ED Results / Procedures / Treatments   Labs (all labs ordered are listed, but only abnormal results are displayed) Labs Reviewed  COMPREHENSIVE METABOLIC PANEL - Abnormal; Notable for the following components:      Result Value   Glucose, Bld 109 (*)    Creatinine, Ser 1.19 (*)    Calcium 8.6 (*)    Albumin 3.3 (*)    AST 11 (*)    Alkaline Phosphatase 164 (*)    GFR, Estimated 47 (*)    All other components within normal limits  CBC WITH DIFFERENTIAL/PLATELET - Abnormal; Notable for the following components:   RBC 3.46 (*)    Hemoglobin 10.4 (*)    HCT 33.5 (*)    RDW 19.1 (*)    Platelets 116 (*)    Monocytes Absolute 1.2 (*)    All other components within normal limits  C DIFFICILE QUICK SCREEN W PCR REFLEX    GASTROINTESTINAL PANEL BY PCR, STOOL (REPLACES STOOL CULTURE)  RESP PANEL BY RT-PCR (FLU A&B, COVID) [id number]  MAGNESIUM    EKG EKG Interpretation  Date/Time:  Thursday March 14 2021 16:12:11 EDT Ventricular Rate:  69 PR Interval:  189 QRS Duration: 99 QT Interval:  419 QTC Calculation: 449 R Axis:   -11 Text Interpretation: Sinus rhythm Low voltage, extremity leads Axis normal No acute ischemia Confirmed by Pieter PartridgeWright, Labaron Digirolamo ([id number]) on 03/14/2021 4:18:51 PM  Radiology No results found.  Procedures Procedures   Medications Ordered in ED Medications - No data to display  ED Course  I have reviewed the triage vital signs and the nursing notes.  Pertinent labs &  imaging results that were available during my care of the patient were reviewed by me and considered in my medical decision making (see chart for details).  Clinical Course as of 03/14/21 1729  Thu Mar 14, 2021  1729 I spoke with [facility] regarding admission. [AW]    Clinical Course User Index [AW] Koleen Distance, MD   MDM Rules/Calculators/A&P                           [name] presents with watery diarrhea x10 days.  Vital signs are normal here, but she was borderline hypotensive with EMS and required IV fluids.  She does endorse orthostasis.  She will be evaluated for evidence of electrolyte abnormalities, infectious etiology of symptoms.  Abdomen exam is benign, and at this point, I have held imaging.  She is established with GI.  She will be admitted to the hospital for observation and further management. Final Clinical Impression(s) / ED Diagnoses Final diagnoses:  Diarrhea, unspecified type    Rx / DC Orders ED Discharge Orders     None        Koleen Distance, MD 03/14/21 1643    Koleen Distance, MD 03/14/21 1729

## 2021-03-15 ENCOUNTER — Encounter (HOSPITAL_COMMUNITY): Payer: Self-pay | Admitting: Internal Medicine

## 2021-03-15 ENCOUNTER — Observation Stay (HOSPITAL_COMMUNITY): Payer: Medicare Other

## 2021-03-15 DIAGNOSIS — K8020 Calculus of gallbladder without cholecystitis without obstruction: Secondary | ICD-10-CM | POA: Diagnosis not present

## 2021-03-15 DIAGNOSIS — N200 Calculus of kidney: Secondary | ICD-10-CM | POA: Diagnosis not present

## 2021-03-15 DIAGNOSIS — E860 Dehydration: Secondary | ICD-10-CM | POA: Diagnosis not present

## 2021-03-15 DIAGNOSIS — R197 Diarrhea, unspecified: Secondary | ICD-10-CM | POA: Diagnosis not present

## 2021-03-15 LAB — COMPREHENSIVE METABOLIC PANEL
ALT: 9 U/L (ref 0–44)
AST: 8 U/L — ABNORMAL LOW (ref 15–41)
Albumin: 3.1 g/dL — ABNORMAL LOW (ref 3.5–5.0)
Alkaline Phosphatase: 155 U/L — ABNORMAL HIGH (ref 38–126)
Anion gap: 8 (ref 5–15)
BUN: 18 mg/dL (ref 8–23)
CO2: 25 mmol/L (ref 22–32)
Calcium: 8.8 mg/dL — ABNORMAL LOW (ref 8.9–10.3)
Chloride: 107 mmol/L (ref 98–111)
Creatinine, Ser: 1.1 mg/dL — ABNORMAL HIGH (ref 0.44–1.00)
GFR, Estimated: 52 mL/min — ABNORMAL LOW (ref >60)
Glucose, Bld: 91 mg/dL (ref 70–99)
Potassium: 3.6 mmol/L (ref 3.5–5.1)
Sodium: 140 mmol/L (ref 135–145)
Total Bilirubin: 0.5 mg/dL (ref 0.3–1.2)
Total Protein: 6.5 g/dL (ref 6.5–8.1)

## 2021-03-15 LAB — RETICULOCYTES
Immature Retic Fract: 26 % — ABNORMAL HIGH (ref 2.3–15.9)
RBC.: 3.25 MIL/uL — ABNORMAL LOW (ref 3.87–5.11)
Retic Count, Absolute: 71.2 10*3/uL (ref 19.0–186.0)
Retic Ct Pct: 2.2 % (ref 0.4–3.1)

## 2021-03-15 LAB — CBC
HCT: 31 % — ABNORMAL LOW (ref 36.0–46.0)
Hemoglobin: 9.7 g/dL — ABNORMAL LOW (ref 12.0–15.0)
MCH: 29.4 pg (ref 26.0–34.0)
MCHC: 31.3 g/dL (ref 30.0–36.0)
MCV: 93.9 fL (ref 80.0–100.0)
Platelets: 113 10*3/uL — ABNORMAL LOW (ref 150–400)
RBC: 3.3 MIL/uL — ABNORMAL LOW (ref 3.87–5.11)
RDW: 19 % — ABNORMAL HIGH (ref 11.5–15.5)
WBC: 4.7 10*3/uL (ref 4.0–10.5)
nRBC: 0 % (ref 0.0–0.2)

## 2021-03-15 LAB — FERRITIN: Ferritin: 165 ng/mL (ref 11–307)

## 2021-03-15 LAB — IRON AND TIBC
Iron: 38 ug/dL (ref 28–170)
Saturation Ratios: 20 % (ref 10.4–31.8)
TIBC: 187 ug/dL — ABNORMAL LOW (ref 250–450)
UIBC: 149 ug/dL

## 2021-03-15 LAB — VITAMIN B12: Vitamin B-12: 2371 pg/mL — ABNORMAL HIGH (ref 180–914)

## 2021-03-15 LAB — FOLATE: Folate: 4.2 ng/mL — ABNORMAL LOW (ref >5.9)

## 2021-03-15 MED ORDER — AZITHROMYCIN 250 MG PO TABS
500.0000 mg | ORAL_TABLET | Freq: Every day | ORAL | Status: AC
  Administered 2021-03-15 – 2021-03-17 (×3): 500 mg via ORAL
  Filled 2021-03-15 (×3): qty 2

## 2021-03-15 MED ORDER — SERTRALINE HCL 50 MG PO TABS
50.0000 mg | ORAL_TABLET | Freq: Every day | ORAL | Status: DC
  Administered 2021-03-15 – 2021-03-17 (×3): 50 mg via ORAL
  Filled 2021-03-15 (×3): qty 1

## 2021-03-15 MED ORDER — FOLIC ACID 1 MG PO TABS
1.0000 mg | ORAL_TABLET | Freq: Every day | ORAL | Status: DC
  Administered 2021-03-15 – 2021-03-17 (×3): 1 mg via ORAL
  Filled 2021-03-15 (×3): qty 1

## 2021-03-15 MED ORDER — IOHEXOL 350 MG/ML SOLN
100.0000 mL | Freq: Once | INTRAVENOUS | Status: AC | PRN
  Administered 2021-03-15: 100 mL via INTRAVENOUS

## 2021-03-15 MED ORDER — BUSPIRONE HCL 5 MG PO TABS
15.0000 mg | ORAL_TABLET | Freq: Two times a day (BID) | ORAL | Status: DC
  Administered 2021-03-15 – 2021-03-17 (×5): 15 mg via ORAL
  Filled 2021-03-15 (×5): qty 3

## 2021-03-15 MED ORDER — IOHEXOL 9 MG/ML PO SOLN
500.0000 mL | ORAL | Status: AC
  Administered 2021-03-15 (×2): 500 mL via ORAL

## 2021-03-15 MED ORDER — DILTIAZEM HCL 30 MG PO TABS
30.0000 mg | ORAL_TABLET | Freq: Four times a day (QID) | ORAL | Status: DC
  Administered 2021-03-15 – 2021-03-16 (×4): 30 mg via ORAL
  Filled 2021-03-15 (×4): qty 1

## 2021-03-15 NOTE — TOC Initial Note (Signed)
Transition of Care [facility]) - Initial/Assessment Note    Patient Details  Name: [facility] MRN: [id number] Date of Birth: [birth year]  Transition of Care [facility]) CM/SW Contact:    Leeroy Cha, RN Phone Number: 03/15/2021, 8:04 AM  Clinical Narrative:                 75 y.o. female with medical history significant for history of atrial fibrillation, morbid obesity, hypothyroidism, LVH, anemia and thrombocytopenia brought by EMS due to ongoing frequent mucoid/loose bowel movement at home.  For last more than a week patient has been having multiple episodes of spontaneous leakage of clear mucousy substance from her rectum, spontaneously although she may have initial urge to defecate then had incontinence and has been spending a lot of time in the toilet and feels weak and dizzy.  Every time she tries to eat she feels somewhat bloated and gets this episode.  She called her doctor Dr. Cristina Gong was plan to see her today she is due for colonoscopy September 15. EMS was called, she was dizzy understanding blood pressure was 100/60 given 350 mL IV fluids brought to the ED. Patient otherwise denies any nausea, vomiting, chest pain, shortness of breath, fever, chills, headache, focal weakness, numbness tingling, speech difficulties    ED Course: BP stable 119-137, HR stable, on RA, labs stable chronic anemia, thrombocytopenia. Give her age, dehydration weakness, admission requested.   Assessment/Plan   Mucoid frequent bowel movement/?Diarrhea: Ongoing for  > 7 DAYS with weakness and dizziness blood pressure at 100 for EMS.  Symptoms worsened by oral intake.  Blood work shows stable renal function.  C. difficile and GI panel COVID-19 pending.  We will keep on interview precaution until then continue gentle IV fluid hydration clear liquid diet and advance as tolerated.  If still persistent will benefit with [facility] consultation tomorrow.   Weakness/Dizziness per EMS. Check OV, Cont ivf.  Getting  orthostatic vitals in the ED.  PT OT eval   Mildly elevated alk phos -isolated, ast/alt normal. Monitor   Hypothyroidism: Continue her Synthroid.  Need TSH checked by PCP   Morbid obesity w/ BMI 45.6: Will benefit with weight loss, healthy lifestyle   PAF on Cardizem and flecainide.  Hold Cardizem due to soft blood pressure.  Continue flecainide question if she takes daily versus as needed, pharmacy consulted to resume home dose. Med rec pending. patient reports she follows with [facility]," reports She is not placed on anticoagulation AS "A. fib is not bad and happened MANY years ago" .   Chronic Thrombocytopenia.  Monitor. Chronic anemia likely from chronic disease.  Monitor   Body mass index is 45.61 kg/m.     Expected Discharge Plan: Home/Self Care Barriers to Discharge: Continued Medical Work up  TOD PLAN OF CARE: to return home with self care with husband Following for progression. Patient Goals and CMS Choice        Expected Discharge Plan and Services Expected Discharge Plan: Home/Self Care   Discharge Planning Services: CM Consult   Living arrangements for the past 2 months: Single Family Home                                      Prior Living Arrangements/Services Living arrangements for the past 2 months: Single Family Home Lives with:: Spouse Patient language and need for interpreter reviewed:: Yes Do you feel safe going back  to the place where you live?: Yes               Activities of Daily Living Home Assistive Devices/Equipment: Eyeglasses, Gilford Rile (specify type) ADL Screening (condition at time of admission) Patient's cognitive ability adequate to safely complete daily activities?: Yes Is the patient deaf or have difficulty hearing?: No Does the patient have difficulty seeing, even when wearing glasses/contacts?: No Does the patient have difficulty concentrating, remembering, or making decisions?: No Patient able to express need for  assistance with ADLs?: Yes Does the patient have difficulty dressing or bathing?: No Independently performs ADLs?: Yes (appropriate for developmental age) Does the patient have difficulty walking or climbing stairs?: Yes Weakness of Legs: Both Weakness of Arms/Hands: Both  Permission Sought/Granted                  Emotional Assessment Appearance:: Appears stated age     Orientation: : Oriented to Self, Oriented to Place, Oriented to  Time, Oriented to Situation Alcohol / Substance Use: Not Applicable    Admission diagnosis:  Diarrhea [R19.7] Diarrhea, unspecified type [R19.7] Patient Active Problem List   Diagnosis Date Noted   Diarrhea 03/14/2021   Acute on chronic diastolic HF (heart failure) ([place]) 08/24/2020   Body mass index (BMI) 50.0-59.9, adult ([place]) 08/24/2020   Chronic kidney disease with active medical management without dialysis, stage 3 (moderate) ([place]) 08/24/2020   Combined rheumatic disorders of mitral, 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   Congestive rheumatic heart failure ([place]) 08/24/2020   Hardening of the aorta (main artery of the heart) ([place]) 08/24/2020   Kidney stone 08/24/2020   Pure hypercholesterolemia 08/24/2020   Scoliosis of thoracic spine 08/24/2020   Solitary pulmonary nodule 08/24/2020   Other long term (current) drug therapy 08/24/2020   Abnormal findings on diagnostic imaging of other specified body structures 08/24/2020   Acute respiratory failure with hypoxia ([place]) 06/04/2020   Multifocal pneumonia 06/03/2020   Low back pain 12/12/2019   History of total knee replacement, right 99/35/7017   Lichen sclerosus et atrophicus 01/04/2015   LVH (left ventricular hypertrophy)    PAF (paroxysmal atrial fibrillation) ([place]) 07/06/2014   Thrombocytopenia ([place]) 07/06/2014   Hyperglycemia 07/06/2014   Morbid obesity ([place]) 01/25/2013   Hypothyroidism 01/15/2013   History of total knee replacement, left 08/09/2007   PCP:  [name],  MD Pharmacy:   [place], [place] [address] Phone: [phone] Fax: [phone]  [id number] [place] [id number]: [phone]: [phone]    Social Determinants of Health (SDOH) Interventions    Readmission Risk Interventions Readmission Risk Prevention Plan 06/08/2020  Post Dischage Appt Complete  Medication Screening Complete  Transportation Screening Complete  Some recent data might be hidden

## 2021-03-15 NOTE — Progress Notes (Signed)
PROGRESS NOTE  Sally Wise WCB:[id number] DOB: 06/15/1946   PCP: [name], MD  Patient is from: Home.  DOA: 03/14/2021 LOS: 0  Chief complaints:  Chief Complaint  Patient presents with   Diarrhea   Nausea   Dehydration     Brief Narrative / Interim history: 75 year old F with PMH of PAF, morbid obesity, hypothyroidism, anemia and thrombocytopenia presenting with weeks of mucoid loose bowel movements, tenesmus, generalized weakness and dizziness, and admitted for the same.  Work-up in ED without significant finding.  Patient was started on IV fluid.  C. difficile and GIP ordered.  Subjective: Seen and examined earlier this morning.  No major events overnight of this morning.  Has not a bowel movement since she came to the floor from ED.  Has intermittent cramping abdominal pain, mainly in LLQ.  Denies nausea or vomiting.  She is worried about cancer.  She denies chest pain, dyspnea or UTI symptoms.  Objective: Vitals:   03/15/21 0021 03/15/21 0526 03/15/21 0801 03/15/21 1355  BP: (!) 143/65 (!) 153/72 (!) 142/81 (!) 143/86  Pulse: (!) 55 (!) 57 (!) 57 62  Resp: _0 Temp: 97.7 F (36.5 C) 97.7 F (36.5 C) 98 F (36.7 C) 98 F (36.7 C)  TempSrc: Oral Oral Oral Oral  SpO2: 99% 99% 98% 98%  Weight:      Height:        Intake/Output Summary (Last 24 hours) at 03/15/2021 1438 Last data filed at 03/15/2021 0738 Gross per 24 hour  Intake --  Output 400 ml  Net -400 ml   Filed Weights   03/14/21 1539 03/14/21 2050  Weight: 136.1 kg 134.2 kg    Examination:  GENERAL: No apparent distress.  Nontoxic. HEENT: MMM.  Vision and hearing grossly intact.  NECK: Supple.  No apparent JVD.  RESP: On RA.  No IWOB.  Fair aeration bilaterally. CVS:  RRR. Heart sounds normal.  ABD/GI/GU: BS+. Abd soft.  Some discomfort but no significant tenderness. MSK/EXT:  Moves extremities. No apparent deformity.  Trace edema in BLE. SKIN: no apparent skin lesion or  wound NEURO: Awake, alert and oriented appropriately.  No apparent focal neuro deficit. PSYCH: Calm. Normal affect.   Procedures:  None  Microbiology summarized: [id number] and influenza PCR nonreactive. C. difficile pending. GI panel pending.  Assessment & Plan: Gastroenteritis-suspect bacterial infection.  She presents with tenesmus, frequent mucoid stool.  Low suspicion for C. difficile.  She denies new food or recent travel.  No prior history of this. -Check CT abdomen and pelvis with contrast -Check CRP and ESR -Start azithromycin empirically -May consider stool for ova/cyst -GI consult if no improvement  Generalized weakness/dizziness: Likely due to the above. -Continue IV fluid hydration -Check four-point orthostatic vitals. -PT/OT eval   Hypothyroidism:  -Check TSH -Continue home Synthroid   Paroxysmal A. fib-on p.o. Cardizem CD and as needed flecainide.  Not on anticoagulation. -Resume home Cardizem with immediate release given bradycardia. -Optimize electrolytes  Normocytic anemia: Likely anemia of chronic disease.  H&H stable. Recent Labs    06/03/20 1707 06/04/20 0502 06/05/20 0518 06/06/20 0558 12/07/20 1547 03/14/21 1609 03/14/21 2014 03/15/21 0605  HGB 10.4* 9.8* 9.9* 10.5* 10.6* 10.4* 10.6* 9.7*  -Check anemia panel  Chronic Thrombocytopenia.  Stable. -Monitor  CKD-3A: Stable. Recent Labs    06/03/20 1251 06/04/20 0502 06/05/20 0518 06/06/20 0558 03/14/21 1609 03/14/21 2014 03/15/21 0605  BUN 16 17 31* 35* 20  --  18  CREATININE  1.09* 1.22* 1.23* 1.13* 1.19* 1.16* 1.10*  -Monitor intermittently  Anxiety and depression: Stable. -Resume home BuSpar and Wellbutrin  Morbid obesity Body mass index is 44.98 kg/m.  -Encourage lifestyle change to lose weight.       DVT prophylaxis:  SCDs Start: 03/14/21 1754   On subcu Lovenox.  Code Status: Full code Family Communication: Patient and/or RN. Available if any question.  Level of  care: Med-Surg Status is: Observation  The patient will require care spanning > 2 midnights and should be moved to inpatient because: Ongoing diagnostic testing needed not appropriate for outpatient work up, IV treatments appropriate due to intensity of illness or inability to take PO, and Inpatient level of care appropriate due to severity of illness  Dispo: The patient is from: Home              Anticipated d/c is to: Home              Patient currently is not medically stable to d/c.   Difficult to place patient No       Consultants:  None   Sch Meds:  Scheduled Meds:  azithromycin  500 mg Oral Daily   busPIRone  15 mg Oral BID   cholecalciferol  5,000 Units Oral Daily   diltiazem  30 mg Oral Q6H   enoxaparin (LOVENOX) injection  60 mg Subcutaneous Q24H   levothyroxine  112 mcg Oral QAC breakfast   sertraline  50 mg Oral Daily   Continuous Infusions:  lactated ringers 100 mL/hr at 03/14/21 2003   PRN Meds:.acetaminophen **OR** acetaminophen, ondansetron **OR** ondansetron (ZOFRAN) IV  Antimicrobials: Anti-infectives (From admission, onward)    Start     Dose/Rate Route Frequency Ordered Stop   03/15/21 1100  azithromycin (ZITHROMAX) tablet 500 mg        500 mg Oral Daily 03/15/21 1007 03/18/21 0959        I have personally reviewed the following labs and images: CBC: Recent Labs  Lab 03/14/21 1609 03/14/21 2014 03/15/21 0605  WBC 5.5 5.4 4.7  NEUTROABS 2.6  --   --   HGB 10.4* 10.6* 9.7*  HCT 33.5* 33.2* 31.0*  MCV 96.8 93.5 93.9  PLT 116* 120* 113*   BMP &GFR Recent Labs  Lab 03/14/21 1609 03/14/21 2014 03/15/21 0605  NA 139  --  140  K 3.5  --  3.6  CL 107  --  107  CO2 22  --  25  GLUCOSE 109*  --  91  BUN 20  --  18  CREATININE 1.19* 1.16* 1.10*  CALCIUM 8.6*  --  8.8*  MG 2.2  --   --    Estimated Creatinine Clearance: 62.2 mL/min (A) (by C-G formula based on SCr of 1.1 mg/dL (H)). Liver & Pancreas: Recent Labs  Lab 03/14/21 1609  03/15/21 0605  AST 11* 8*  ALT 8 9  ALKPHOS 164* 155*  BILITOT 0.5 0.5  PROT 6.9 6.5  ALBUMIN 3.3* 3.1*   No results for input(s): LIPASE, AMYLASE in the last 168 hours. No results for input(s): AMMONIA in the last 168 hours. Diabetic: No results for input(s): HGBA1C in the last 72 hours. No results for input(s): GLUCAP in the last 168 hours. Cardiac Enzymes: No results for input(s): CKTOTAL, CKMB, CKMBINDEX, TROPONINI in the last 168 hours. No results for input(s): PROBNP in the last 8760 hours. Coagulation Profile: No results for input(s): INR, PROTIME in the last 168 hours. Thyroid Function Tests: No  results for input(s): TSH, T4TOTAL, FREET4, T3FREE, THYROIDAB in the last 72 hours. Lipid Profile: No results for input(s): CHOL, HDL, LDLCALC, TRIG, CHOLHDL, LDLDIRECT in the last 72 hours. Anemia Panel: Recent Labs    03/15/21 0604 03/15/21 1030  VITAMINB12 2,371*  --   FOLATE 4.2*  --   FERRITIN 165  --   TIBC 187*  --   IRON 38  --   RETICCTPCT  --  2.2   Urine analysis:    Component Value Date/Time   COLORURINE GREEN (A) 06/05/2020 1800   APPEARANCEUR CLEAR 06/05/2020 1800   LABSPEC 1.015 06/05/2020 1800   PHURINE 5.5 06/05/2020 1800   GLUCOSEU NEGATIVE 06/05/2020 1800   HGBUR TRACE (A) 06/05/2020 1800   BILIRUBINUR NEGATIVE 06/05/2020 1800   BILIRUBINUR neg 04/08/2016 0916   KETONESUR NEGATIVE 06/05/2020 1800   PROTEINUR NEGATIVE 06/05/2020 1800   UROBILINOGEN negative 04/08/2016 0916   UROBILINOGEN 0.2 01/15/2013 1538   NITRITE NEGATIVE 06/05/2020 1800   LEUKOCYTESUR NEGATIVE 06/05/2020 1800   Sepsis Labs: Invalid input(s): PROCALCITONIN, [place]  Microbiology: Recent Results (from the past 240 hour(s))  Resp Panel by RT-PCR (Flu A&B, Covid) Nasopharyngeal Swab     Status: None   Collection Time: 03/14/21  5:34 PM   Specimen: Nasopharyngeal Swab; Nasopharyngeal(NP) swabs in vial transport medium  Result Value Ref Range Status   SARS Coronavirus  2 by RT PCR NEGATIVE NEGATIVE Final    Comment: (NOTE) SARS-CoV-2 target nucleic acids are NOT DETECTED.  The SARS-CoV-2 RNA is generally detectable in upper respiratory specimens during the acute phase of infection. The lowest concentration of SARS-CoV-2 viral copies this assay can detect is 138 copies/mL. A negative result does not preclude SARS-Cov-2 infection and should not be used as the sole basis for treatment or other patient management decisions. A negative result may occur with  improper specimen collection/handling, submission of specimen other than nasopharyngeal swab, presence of viral mutation(s) within the areas targeted by this assay, and inadequate number of viral copies(<138 copies/mL). A negative result must be combined with clinical observations, patient history, and epidemiological information. The expected result is Negative.  Fact Sheet for Patients:  EntrepreneurPulse.com.au  Fact Sheet for Healthcare Providers:  IncredibleEmployment.be  This test is no t yet approved or cleared by the [place] FDA and  has been authorized for detection and/or diagnosis of SARS-CoV-2 by FDA under an Emergency Use Authorization (EUA). This EUA will remain  in effect (meaning this test can be used) for the duration of the COVID-19 declaration under Section 564(b)(1) of the Act, 21 U.S.C.section 360bbb-3(b)(1), unless the authorization is terminated  or revoked sooner.       Influenza A by PCR NEGATIVE NEGATIVE Final   Influenza B by PCR NEGATIVE NEGATIVE Final    Comment: (NOTE) The Xpert Xpress SARS-CoV-2/FLU/RSV plus assay is intended as an aid in the diagnosis of influenza from Nasopharyngeal swab specimens and should not be used as a sole basis for treatment. Nasal washings and aspirates are unacceptable for Xpert Xpress SARS-CoV-2/FLU/RSV testing.  Fact Sheet for Patients: EntrepreneurPulse.com.au  Fact  Sheet for Healthcare Providers: IncredibleEmployment.be  This test is not yet approved or cleared by the [place] FDA and has been authorized for detection and/or diagnosis of SARS-CoV-2 by FDA under an Emergency Use Authorization (EUA). This EUA will remain in effect (meaning this test can be used) for the duration of the COVID-19 declaration under Section 564(b)(1) of the Act, 21 U.S.C. section 360bbb-3(b)(1), unless the authorization is terminated  or revoked.  Performed at [facility], [place] [address] [place], [place] [address]     Radiology Studies: No results found.    Analy Bassford T. [place]  If 7PM-7AM, please contact night-coverage [email or web address] 03/15/2021, 2:38 PM

## 2021-03-16 ENCOUNTER — Encounter (HOSPITAL_COMMUNITY): Payer: Self-pay | Admitting: Student

## 2021-03-16 DIAGNOSIS — Z79899 Other long term (current) drug therapy: Secondary | ICD-10-CM | POA: Diagnosis not present

## 2021-03-16 DIAGNOSIS — E039 Hypothyroidism, unspecified: Secondary | ICD-10-CM | POA: Diagnosis present

## 2021-03-16 DIAGNOSIS — I480 Paroxysmal atrial fibrillation: Secondary | ICD-10-CM | POA: Diagnosis present

## 2021-03-16 DIAGNOSIS — A044 Other intestinal Escherichia coli infections: Secondary | ICD-10-CM | POA: Diagnosis present

## 2021-03-16 DIAGNOSIS — Z841 Family history of disorders of kidney and ureter: Secondary | ICD-10-CM | POA: Diagnosis not present

## 2021-03-16 DIAGNOSIS — Z96653 Presence of artificial knee joint, bilateral: Secondary | ICD-10-CM | POA: Diagnosis present

## 2021-03-16 DIAGNOSIS — Z6841 Body Mass Index (BMI) 40.0 and over, adult: Secondary | ICD-10-CM | POA: Diagnosis not present

## 2021-03-16 DIAGNOSIS — N1831 Chronic kidney disease, stage 3a: Secondary | ICD-10-CM | POA: Diagnosis present

## 2021-03-16 DIAGNOSIS — Z79890 Hormone replacement therapy (postmenopausal): Secondary | ICD-10-CM | POA: Diagnosis not present

## 2021-03-16 DIAGNOSIS — Z20822 Contact with and (suspected) exposure to covid-19: Secondary | ICD-10-CM | POA: Diagnosis present

## 2021-03-16 DIAGNOSIS — R911 Solitary pulmonary nodule: Secondary | ICD-10-CM | POA: Diagnosis present

## 2021-03-16 DIAGNOSIS — Z820 Family history of epilepsy and other diseases of the nervous system: Secondary | ICD-10-CM | POA: Diagnosis not present

## 2021-03-16 DIAGNOSIS — Z833 Family history of diabetes mellitus: Secondary | ICD-10-CM | POA: Diagnosis not present

## 2021-03-16 DIAGNOSIS — A040 Enteropathogenic Escherichia coli infection: Secondary | ICD-10-CM | POA: Diagnosis not present

## 2021-03-16 DIAGNOSIS — E7800 Pure hypercholesterolemia, unspecified: Secondary | ICD-10-CM | POA: Diagnosis present

## 2021-03-16 DIAGNOSIS — D696 Thrombocytopenia, unspecified: Secondary | ICD-10-CM | POA: Diagnosis present

## 2021-03-16 DIAGNOSIS — R197 Diarrhea, unspecified: Secondary | ICD-10-CM | POA: Diagnosis present

## 2021-03-16 DIAGNOSIS — K529 Noninfective gastroenteritis and colitis, unspecified: Secondary | ICD-10-CM | POA: Diagnosis not present

## 2021-03-16 DIAGNOSIS — I130 Hypertensive heart and chronic kidney disease with heart failure and stage 1 through stage 4 chronic kidney disease, or unspecified chronic kidney disease: Secondary | ICD-10-CM | POA: Diagnosis present

## 2021-03-16 DIAGNOSIS — F419 Anxiety disorder, unspecified: Secondary | ICD-10-CM | POA: Diagnosis present

## 2021-03-16 DIAGNOSIS — E860 Dehydration: Secondary | ICD-10-CM | POA: Diagnosis present

## 2021-03-16 DIAGNOSIS — D631 Anemia in chronic kidney disease: Secondary | ICD-10-CM | POA: Diagnosis present

## 2021-03-16 DIAGNOSIS — I5032 Chronic diastolic (congestive) heart failure: Secondary | ICD-10-CM | POA: Diagnosis present

## 2021-03-16 DIAGNOSIS — F32A Depression, unspecified: Secondary | ICD-10-CM | POA: Diagnosis present

## 2021-03-16 DIAGNOSIS — Z8249 Family history of ischemic heart disease and other diseases of the circulatory system: Secondary | ICD-10-CM | POA: Diagnosis not present

## 2021-03-16 LAB — CBC
HCT: 29.9 % — ABNORMAL LOW (ref 36.0–46.0)
Hemoglobin: 9.6 g/dL — ABNORMAL LOW (ref 12.0–15.0)
MCH: 29.7 pg (ref 26.0–34.0)
MCHC: 32.1 g/dL (ref 30.0–36.0)
MCV: 92.6 fL (ref 80.0–100.0)
Platelets: 106 10*3/uL — ABNORMAL LOW (ref 150–400)
RBC: 3.23 MIL/uL — ABNORMAL LOW (ref 3.87–5.11)
RDW: 19.1 % — ABNORMAL HIGH (ref 11.5–15.5)
WBC: 4.3 10*3/uL (ref 4.0–10.5)
nRBC: 0 % (ref 0.0–0.2)

## 2021-03-16 LAB — RENAL FUNCTION PANEL
Albumin: 3.1 g/dL — ABNORMAL LOW (ref 3.5–5.0)
Anion gap: 6 (ref 5–15)
BUN: 14 mg/dL (ref 8–23)
CO2: 25 mmol/L (ref 22–32)
Calcium: 8.7 mg/dL — ABNORMAL LOW (ref 8.9–10.3)
Chloride: 105 mmol/L (ref 98–111)
Creatinine, Ser: 0.97 mg/dL (ref 0.44–1.00)
GFR, Estimated: >60 mL/min (ref >60)
Glucose, Bld: 91 mg/dL (ref 70–99)
Phosphorus: 4.1 mg/dL (ref 2.5–4.6)
Potassium: 3.7 mmol/L (ref 3.5–5.1)
Sodium: 136 mmol/L (ref 135–145)

## 2021-03-16 LAB — MAGNESIUM: Magnesium: 2 mg/dL (ref 1.7–2.4)

## 2021-03-16 LAB — GASTROINTESTINAL PANEL BY PCR, STOOL (REPLACES STOOL CULTURE)

## 2021-03-16 LAB — C-REACTIVE PROTEIN: CRP: 3.9 mg/dL — ABNORMAL HIGH (ref <1.0)

## 2021-03-16 LAB — SEDIMENTATION RATE: Sed Rate: 65 mm/hr — ABNORMAL HIGH (ref 0–22)

## 2021-03-16 MED ORDER — DILTIAZEM HCL ER COATED BEADS 180 MG PO CP24
180.0000 mg | ORAL_CAPSULE | Freq: Every day | ORAL | Status: DC
  Administered 2021-03-16 – 2021-03-17 (×2): 180 mg via ORAL
  Filled 2021-03-16 (×2): qty 1

## 2021-03-16 NOTE — Progress Notes (Signed)
PROGRESS NOTE  Sally Wise WFU:[id number] DOB: 11-11-1945   PCP: [name], MD  Patient is from: Home.  DOA: 03/14/2021 LOS: 0  Chief complaints:  Chief Complaint  Patient presents with   Diarrhea   Nausea   Dehydration     Brief Narrative / Interim history: 75 year old F with PMH of PAF, morbid obesity, hypothyroidism, anemia and thrombocytopenia presenting with weeks of mucoid loose bowel movements, tenesmus, generalized weakness and dizziness, and admitted for the same.  Work-up in ED without significant finding.  Patient was started on IV fluid.  CT abdomen and pelvis without significant finding to explain patient's symptoms.  GI panel pending.  Subjective: Seen and examined earlier this morning.  No major events overnight.  She had 2 episodes of loose bowel movement this morning.  She had 3 episode of diarrhea yesterday.  She denies abdominal pain, nausea, vomiting, chest pain and dyspnea.   Objective: Vitals:   03/15/21 1355 03/15/21 2118 03/16/21 0500 03/16/21 1343  BP: (!) 143/86 138/68  (!) 140/59  Pulse: 62 71  68  Resp: _0 Temp: 98 F (36.7 C) 98 F (36.7 C)  98 F (36.7 C)  TempSrc: Oral Oral  Oral  SpO2: 98% 99%  95%  Weight:   134.6 kg   Height:        Intake/Output Summary (Last 24 hours) at 03/16/2021 1349 Last data filed at 03/16/2021 1100 Gross per 24 hour  Intake 1335.62 ml  Output --  Net 1335.62 ml   Filed Weights   03/14/21 1539 03/14/21 2050 03/16/21 0500  Weight: 136.1 kg 134.2 kg 134.6 kg    Examination:  GENERAL: No apparent distress.  Nontoxic. HEENT: MMM.  Vision and hearing grossly intact.  NECK: Supple.  No apparent JVD.  RESP: On RA.  No IWOB.  Fair aeration bilaterally. CVS:  RRR. Heart sounds normal.  ABD/GI/GU: BS+. Abd soft, NTND.  MSK/EXT:  Moves extremities. No apparent deformity. No edema.  SKIN: no apparent skin lesion or wound NEURO: Awake and alert. Oriented appropriately.  No apparent focal neuro  deficit. PSYCH: Somewhat anxious.  Procedures:  None  Microbiology summarized: [id number] and influenza PCR nonreactive. C. difficile pending. GI panel pending.  Assessment & Plan: Gastroenteritis/mucoid diarrhea-suspect bacterial infection.  She presents with tenesmus, frequent mucoid stool.  Low suspicion for C. difficile.  She denies new food or recent travel.  No prior history of this.  CRP and ESR slightly elevated.  No personal or family history of IBD.  CT abdomen and pelvis without significant finding to explain patient's symptoms.  Had 2 episodes of diarrhea earlier this morning.  -Continue azithromycin empirically -Continue IV fluid -Antimotility agents if C. difficile and GI panel is negative -GI consult if no improvement  Generalized weakness/dizziness: Likely due to the above. -Continue IV fluid hydration -Check four-point orthostatic vitals. -PT/OT eval   Hypothyroidism:  -Check TSH -Continue home Synthroid   Paroxysmal A. fib-on p.o. Cardizem CD and as needed flecainide.  Not on anticoagulation. -Resume home Cardizem with immediate release given bradycardia. -Optimize electrolytes  Normocytic anemia: Likely anemia of chronic disease.  Anemia panel with mild folic acid deficiency.  H&H stable. Recent Labs    06/03/20 1707 06/04/20 0502 06/05/20 0518 06/06/20 0558 12/07/20 1547 03/14/21 1609 03/14/21 2014 03/15/21 0605 03/16/21 0715  HGB 10.4* 9.8* 9.9* 10.5* 10.6* 10.4* 10.6* 9.7* 9.6*  -Replenish folic acid -Monitor H&H  Chronic Thrombocytopenia.  Relatively stable. Recent Labs  Lab 03/14/21  1609 03/14/21 2014 03/15/21 0605 03/16/21 0715  PLT 116* 120* 113* 106*  -Monitor  CKD-3A: Stable. Recent Labs    06/03/20 1251 06/04/20 0502 06/05/20 0518 06/06/20 0558 03/14/21 1609 03/14/21 2014 03/15/21 0605 03/16/21 0715  BUN 16 17 31* 35* 20  --  18 14  CREATININE 1.09* 1.22* 1.23* 1.13* 1.19* 1.16* 1.10* 0.97  -Monitor  intermittently  Anxiety and depression: Somewhat anxious. -Continue home BuSpar and Wellbutrin  Morbid obesity Body mass index is 45.12 kg/m.  -Encourage lifestyle change to lose weight.       DVT prophylaxis:  SCDs Start: 03/14/21 1754   On subcu Lovenox.  Code Status: Full code Family Communication: Patient and/or RN. Available if any question.  Level of care: Med-Surg Status is: Observation  The patient will require care spanning > 2 midnights and should be moved to inpatient because: Ongoing diagnostic testing needed not appropriate for outpatient work up, IV treatments appropriate due to intensity of illness or inability to take PO, and Inpatient level of care appropriate due to severity of illness  Dispo: The patient is from: Home              Anticipated d/c is to: Home              Patient currently is not medically stable to d/c.   Difficult to place patient No       Consultants:  None   Sch Meds:  Scheduled Meds:  azithromycin  500 mg Oral Daily   busPIRone  15 mg Oral BID   cholecalciferol  5,000 Units Oral Daily   diltiazem  30 mg Oral Q6H   enoxaparin (LOVENOX) injection  60 mg Subcutaneous X91Y   folic acid  1 mg Oral Daily   levothyroxine  112 mcg Oral QAC breakfast   sertraline  50 mg Oral Daily   Continuous Infusions:  lactated ringers 75 mL/hr at 03/16/21 1251   PRN Meds:.acetaminophen **OR** acetaminophen, ondansetron **OR** ondansetron (ZOFRAN) IV  Antimicrobials: Anti-infectives (From admission, onward)    Start     Dose/Rate Route Frequency Ordered Stop   03/15/21 1100  azithromycin (ZITHROMAX) tablet 500 mg        500 mg Oral Daily 03/15/21 1007 03/18/21 0959        I have personally reviewed the following labs and images: CBC: Recent Labs  Lab 03/14/21 1609 03/14/21 2014 03/15/21 0605 03/16/21 0715  WBC 5.5 5.4 4.7 4.3  NEUTROABS 2.6  --   --   --   HGB 10.4* 10.6* 9.7* 9.6*  HCT 33.5* 33.2* 31.0* 29.9*  MCV 96.8 93.5  93.9 92.6  PLT 116* 120* 113* 106*   BMP &GFR Recent Labs  Lab 03/14/21 1609 03/14/21 2014 03/15/21 0605 03/16/21 0715  NA 139  --  140 136  K 3.5  --  3.6 3.7  CL 107  --  107 105  CO2 22  --  25 25  GLUCOSE 109*  --  91 91  BUN 20  --  18 14  CREATININE 1.19* 1.16* 1.10* 0.97  CALCIUM 8.6*  --  8.8* 8.7*  MG 2.2  --   --  2.0  PHOS  --   --   --  4.1   Estimated Creatinine Clearance: 70.7 mL/min (by C-G formula based on SCr of 0.97 mg/dL). Liver & Pancreas: Recent Labs  Lab 03/14/21 1609 03/15/21 0605 03/16/21 0715  AST 11* 8*  --   ALT 8 9  --  ALKPHOS 164* 155*  --   BILITOT 0.5 0.5  --   PROT 6.9 6.5  --   ALBUMIN 3.3* 3.1* 3.1*   No results for input(s): LIPASE, AMYLASE in the last 168 hours. No results for input(s): AMMONIA in the last 168 hours. Diabetic: No results for input(s): HGBA1C in the last 72 hours. No results for input(s): GLUCAP in the last 168 hours. Cardiac Enzymes: No results for input(s): CKTOTAL, CKMB, CKMBINDEX, TROPONINI in the last 168 hours. No results for input(s): PROBNP in the last 8760 hours. Coagulation Profile: No results for input(s): INR, PROTIME in the last 168 hours. Thyroid Function Tests: No results for input(s): TSH, T4TOTAL, FREET4, T3FREE, THYROIDAB in the last 72 hours. Lipid Profile: No results for input(s): CHOL, HDL, LDLCALC, TRIG, CHOLHDL, LDLDIRECT in the last 72 hours. Anemia Panel: Recent Labs    03/15/21 0604 03/15/21 1030  VITAMINB12 2,371*  --   FOLATE 4.2*  --   FERRITIN 165  --   TIBC 187*  --   IRON 38  --   RETICCTPCT  --  2.2   Urine analysis:    Component Value Date/Time   COLORURINE GREEN (A) 06/05/2020 1800   APPEARANCEUR CLEAR 06/05/2020 1800   LABSPEC 1.015 06/05/2020 1800   PHURINE 5.5 06/05/2020 1800   GLUCOSEU NEGATIVE 06/05/2020 1800   HGBUR TRACE (A) 06/05/2020 1800   BILIRUBINUR NEGATIVE 06/05/2020 1800   BILIRUBINUR neg 04/08/2016 0916   KETONESUR NEGATIVE 06/05/2020 1800    PROTEINUR NEGATIVE 06/05/2020 1800   UROBILINOGEN negative 04/08/2016 0916   UROBILINOGEN 0.2 01/15/2013 1538   NITRITE NEGATIVE 06/05/2020 1800   LEUKOCYTESUR NEGATIVE 06/05/2020 1800   Sepsis Labs: Invalid input(s): PROCALCITONIN, [place]  Microbiology: Recent Results (from the past 240 hour(s))  Resp Panel by RT-PCR (Flu A&B, Covid) Nasopharyngeal Swab     Status: None   Collection Time: 03/14/21  5:34 PM   Specimen: Nasopharyngeal Swab; Nasopharyngeal(NP) swabs in vial transport medium  Result Value Ref Range Status   SARS Coronavirus 2 by RT PCR NEGATIVE NEGATIVE Final    Comment: (NOTE) SARS-CoV-2 target nucleic acids are NOT DETECTED.  The SARS-CoV-2 RNA is generally detectable in upper respiratory specimens during the acute phase of infection. The lowest concentration of SARS-CoV-2 viral copies this assay can detect is 138 copies/mL. A negative result does not preclude SARS-Cov-2 infection and should not be used as the sole basis for treatment or other patient management decisions. A negative result may occur with  improper specimen collection/handling, submission of specimen other than nasopharyngeal swab, presence of viral mutation(s) within the areas targeted by this assay, and inadequate number of viral copies(<138 copies/mL). A negative result must be combined with clinical observations, patient history, and epidemiological information. The expected result is Negative.  Fact Sheet for Patients:  EntrepreneurPulse.com.au  Fact Sheet for Healthcare Providers:  IncredibleEmployment.be  This test is no t yet approved or cleared by the [place] FDA and  has been authorized for detection and/or diagnosis of SARS-CoV-2 by FDA under an Emergency Use Authorization (EUA). This EUA will remain  in effect (meaning this test can be used) for the duration of the COVID-19 declaration under Section 564(b)(1) of the Act,  21 U.S.C.section 360bbb-3(b)(1), unless the authorization is terminated  or revoked sooner.       Influenza A by PCR NEGATIVE NEGATIVE Final   Influenza B by PCR NEGATIVE NEGATIVE Final    Comment: (NOTE) The Xpert Xpress SARS-CoV-2/FLU/RSV plus assay is intended as an  aid in the diagnosis of influenza from Nasopharyngeal swab specimens and should not be used as a sole basis for treatment. Nasal washings and aspirates are unacceptable for Xpert Xpress SARS-CoV-2/FLU/RSV testing.  Fact Sheet for Patients: EntrepreneurPulse.com.au  Fact Sheet for Healthcare Providers: IncredibleEmployment.be  This test is not yet approved or cleared by the [place] FDA and has been authorized for detection and/or diagnosis of SARS-CoV-2 by FDA under an Emergency Use Authorization (EUA). This EUA will remain in effect (meaning this test can be used) for the duration of the COVID-19 declaration under Section 564(b)(1) of the Act, 21 U.S.C. section 360bbb-3(b)(1), unless the authorization is terminated or revoked.  Performed at [facility], [place] [address], [place], [place] [address]     Radiology Studies: CT ABDOMEN PELVIS W CONTRAST  Result Date: 03/15/2021 CLINICAL DATA:  Abdominal abscess/infection suspected. Dehydration, diarrhea, weakness, nausea. EXAM: CT ABDOMEN AND PELVIS WITH CONTRAST TECHNIQUE: Multidetector CT imaging of the abdomen and pelvis was performed using the standard protocol following bolus administration of intravenous contrast. CONTRAST:  135m OMNIPAQUE IOHEXOL 350 MG/ML SOLN COMPARISON:  07/26/2018 FINDINGS: Lower chest: 8 mm right middle lobe pulmonary nodule is not fully included on this examination, but appears grossly stable since prior chest CT of 04/23/2020. The visualized lung bases are otherwise clear. The visualized heart and pericardium are unremarkable. Hepatobiliary: 10 mm exophytic nodular density arising  from the posterior aspect of the right hepatic lobe, axial image 16/2, is stable since remote prior examination of 11/08/2007 and, while indeterminate, is likely benign. Probable tiny cyst within the right hepatic lobe, image # 14/2. 13 mm hypodensity within the right hepatic dome, axial image # 8/2, is indeterminate, not well characterized on this examination. No intra or extrahepatic biliary ductal dilation. Cholelithiasis noted without pericholecystic inflammatory change. Pancreas: Unremarkable Spleen: 16 mm hypodensity noted within the interpolar region of the spleen, axial image 18/2, indeterminate, but possibly representing a small cyst or hemangioma in a patient without a history of malignancy. The spleen is otherwise unremarkable. Adrenals/Urinary Tract: The adrenal glands are unremarkable. Mild bilateral renal cortical atrophy. Kidneys are normal in position. 2 mm nonobstructing calculus noted within the lower pole of the left kidney. No hydronephrosis. No ureteral calculi. No enhancing intrarenal masses. No perinephric fluid collections. The bladder is unremarkable. Stomach/Bowel: Severe descending and sigmoid colonic diverticulosis. Scattered diverticular seen within the ascending colon. No superimposed inflammatory change. The stomach, small bowel, and large bowel are otherwise unremarkable. Appendix normal. No free intraperitoneal gas or fluid. Vascular/Lymphatic: Mild aortoiliac atherosclerotic calcification. No aortic aneurysm. No pathologic adenopathy within the abdomen and pelvis. Reproductive: Involuted fibroid within the uterus demonstrating dense calcification. Partially calcified cystic lesion within the right ovary, axial image # 59/2, measures 2.5 x 3.5 cm, not well characterized but stable in size since remote prior examination of 11/08/2007. Other: No abdominal wall hernia. Mild presacral edema is nonspecific. Mild enhancing periportal adenopathy has developed, with the index lymph node seen  within the biliary hilum measuring 13 mm in short axis diameter at axial image # 25/2. This is nonspecific and may be reactive in nature. Musculoskeletal: Advanced degenerative changes are seen within the lumbar sign and sacroiliac joints bilaterally. No acute bone abnormality. No lytic or blastic bone lesions are identified. IMPRESSION: No acute intra-abdominal pathology identified. No definite radiographic explanation for the patient's reported symptoms. Indeterminate 16 mm hypodensity within the a right hepatic dome. While this may represent a complex cyst or hemangioma in a patient without a  history of malignancy, this is not well characterized on this examination. Given its depth, this may be better assessed with dedicated MRI examination, if indicated. Mild periportal adenopathy. This is nonspecific and can be seen in a number of inflammatory or infectious conditions involving the liver and biliary tree such as hepatitis or cholangitis. Correlation with liver enzymes may be helpful for further evaluation. Cholelithiasis. Minimal left nonobstructing nephrolithiasis. Severe distal colonic diverticulosis without superimposed inflammatory change. Stable right adnexal cyst since remote prior examination of 11/08/2007, not well characterized but safely considered benign given its stability over time. Aortic Atherosclerosis (ICD10-I70.0). Electronically Signed   By: Fidela Salisbury M.D.   On: 03/15/2021 20:00      Loyce Klasen T. [place]  If 7PM-7AM, please contact night-coverage [email or web address] 03/16/2021, 1:49 PM

## 2021-03-17 DIAGNOSIS — I480 Paroxysmal atrial fibrillation: Secondary | ICD-10-CM

## 2021-03-17 DIAGNOSIS — D693 Immune thrombocytopenic purpura: Secondary | ICD-10-CM

## 2021-03-17 DIAGNOSIS — D696 Thrombocytopenia, unspecified: Secondary | ICD-10-CM

## 2021-03-17 DIAGNOSIS — I517 Cardiomegaly: Secondary | ICD-10-CM

## 2021-03-17 DIAGNOSIS — E039 Hypothyroidism, unspecified: Secondary | ICD-10-CM

## 2021-03-17 DIAGNOSIS — A040 Enteropathogenic Escherichia coli infection: Secondary | ICD-10-CM

## 2021-03-17 DIAGNOSIS — N1831 Chronic kidney disease, stage 3a: Secondary | ICD-10-CM

## 2021-03-17 DIAGNOSIS — K529 Noninfective gastroenteritis and colitis, unspecified: Secondary | ICD-10-CM

## 2021-03-17 LAB — RENAL FUNCTION PANEL
Albumin: 2.9 g/dL — ABNORMAL LOW (ref 3.5–5.0)
Anion gap: 8 (ref 5–15)
BUN: 15 mg/dL (ref 8–23)
CO2: 24 mmol/L (ref 22–32)
Calcium: 8.3 mg/dL — ABNORMAL LOW (ref 8.9–10.3)
Chloride: 102 mmol/L (ref 98–111)
Creatinine, Ser: 1 mg/dL (ref 0.44–1.00)
GFR, Estimated: 58 mL/min — ABNORMAL LOW (ref >60)
Glucose, Bld: 92 mg/dL (ref 70–99)
Phosphorus: 3.8 mg/dL (ref 2.5–4.6)
Potassium: 3.6 mmol/L (ref 3.5–5.1)
Sodium: 134 mmol/L — ABNORMAL LOW (ref 135–145)

## 2021-03-17 LAB — CBC
HCT: 30.1 % — ABNORMAL LOW (ref 36.0–46.0)
Hemoglobin: 9.5 g/dL — ABNORMAL LOW (ref 12.0–15.0)
MCH: 29.5 pg (ref 26.0–34.0)
MCHC: 31.6 g/dL (ref 30.0–36.0)
MCV: 93.5 fL (ref 80.0–100.0)
Platelets: 107 10*3/uL — ABNORMAL LOW (ref 150–400)
RBC: 3.22 MIL/uL — ABNORMAL LOW (ref 3.87–5.11)
RDW: 19.2 % — ABNORMAL HIGH (ref 11.5–15.5)
WBC: 4.5 10*3/uL (ref 4.0–10.5)
nRBC: 0 % (ref 0.0–0.2)

## 2021-03-17 LAB — MAGNESIUM: Magnesium: 1.9 mg/dL (ref 1.7–2.4)

## 2021-03-17 MED ORDER — LOPERAMIDE HCL 2 MG PO CAPS: 2.0000 mg | ORAL_CAPSULE | ORAL | Status: DC | PRN

## 2021-03-17 MED ORDER — FOLIC ACID 1 MG PO TABS: 1.0000 mg | ORAL_TABLET | Freq: Every day | ORAL | 1 refills | Status: DC

## 2021-03-17 MED ORDER — LOPERAMIDE HCL 2 MG PO CAPS: 2.0000 mg | ORAL_CAPSULE | ORAL | 0 refills | Status: DC | PRN

## 2021-03-17 NOTE — Evaluation (Signed)
Physical Therapy Evaluation Patient Details Name: Sally Wise MRN: [id number] DOB: 1945/12/30 Today's Date: 03/17/2021   History of Present Illness  patient is a 75 year old female who presented with weakness and loose bowels. patient was found to have gastroenteritis and hypothyroidism. PMH: PAF, CHF, hypothyroidism, bilateral TKA, obesity.  Clinical Impression  Pt admitted as above and presenting today with MOD IND in most mobility tasks (min assist to sit up at EOB)  including ambulation with RW.  Pt plans dc home this date and states she has existing OP PT order but has not initiated program yet 2* ongoing issues with diarrhea but she does intend to pursue PT follow up as other health issues allow.    Follow Up Recommendations Other (comment) (Pt states she has OP PT referral but has not started 2* issues with diarrhea.)    Equipment Recommendations  None recommended by PT    Recommendations for Other Services       Precautions / Restrictions Precautions Precautions: Fall;Sternal Restrictions Weight Bearing Restrictions: No      Mobility  Bed Mobility Overal bed mobility: Needs Assistance Bed Mobility: Supine to Sit     Supine to sit: Min assist     General bed mobility comments: patient requested for [facility] to be raised for supine to sit with use of bed rail for supine to sit on edge of bed. patient reported that she had bed that HOB elevated but she did not use it. patient was noted to get a little frustrated with questions reguarding assistance levels for ADLs at home.    Transfers Overall transfer level: Modified independent Equipment used: Rolling walker (2 wheeled) Transfers: Sit to/from Stand           General transfer comment: No physical assist  Ambulation/Gait Ambulation/Gait assistance: Supervision;Modified independent (Device/Increase time) Gait Distance (Feet): 75 Feet   Gait Pattern/deviations: Step-to pattern;Step-through pattern     General  Gait Details: min cues for position from RW.  Pt self-limiting distance  Information systems manager [place] (Stroke Patients Only)       Balance Overall balance assessment: Modified Independent                                           Pertinent Vitals/Pain Pain Assessment: No/denies pain    Home Living Family/patient expects to be discharged to:: Private residence Living Arrangements: Spouse/significant other Available Help at Discharge: Family Type of Home: House Home Access: Stairs to enter Entrance Stairs-Rails: Right Entrance Stairs-Number of Steps: 3 Home Layout: One level Home Equipment: Cane - single point;Grab bars - tub/shower;Walker - 2 wheels Additional Comments: reports HOB is adjustable but she does not need/ use it at home    Prior Function Level of Independence: Independent         Comments: patient does not need device in home. patient reported using walker most recently in community.     Hand Dominance   Dominant Hand: Right    Extremity/Trunk Assessment   Upper Extremity Assessment Upper Extremity Assessment: Overall WFL for tasks assessed    Lower Extremity Assessment Lower Extremity Assessment: Overall WFL for tasks assessed    Cervical / Trunk Assessment Cervical / Trunk Assessment: Normal  Communication   Communication: No difficulties  Cognition Arousal/Alertness: Awake/alert Behavior During Therapy: [facility]  for tasks assessed/performed Overall Cognitive Status: Within Functional Limits for tasks assessed                                        General Comments      Exercises     Assessment/Plan    PT Assessment Patent does not need any further PT services  PT Problem List Decreased activity tolerance;Obesity       PT Treatment Interventions      PT Goals (Current goals can be found in the Care Plan section)  Acute Rehab PT Goals Patient Stated Goal: to go  home today PT Goal Formulation: All assessment and education complete, DC therapy    Frequency Min 1X/week   Barriers to discharge        Co-evaluation PT/OT/SLP Co-Evaluation/Treatment: Yes Reason for Co-Treatment: To address functional/ADL transfers PT goals addressed during session: Mobility/safety with mobility OT goals addressed during session: ADL's and self-care       AM-PAC PT "6 Clicks" Mobility  Outcome Measure Help needed turning from your back to your side while in a flat bed without using bedrails?: None Help needed moving from lying on your back to sitting on the side of a flat bed without using bedrails?: A Little Help needed moving to and from a bed to a chair (including a wheelchair)?: None Help needed standing up from a chair using your arms (e.g., wheelchair or bedside chair)?: None Help needed to walk in hospital room?: None Help needed climbing 3-5 steps with a railing? : A Little 6 Click Score: 22    End of Session Equipment Utilized During Treatment: Gait belt Activity Tolerance: Patient tolerated treatment well Patient left: in chair;with call bell/phone within reach Nurse Communication: Mobility status PT Visit Diagnosis: Difficulty in walking, not elsewhere classified (R26.2)    Time: 1191-4782 PT Time Calculation (min) (ACUTE ONLY): 17 min   Charges:   PT Evaluation $PT Eval Low Complexity: 1 Low          Mauro Kaufmann PT Acute Rehabilitation Services Pager [phone] Office [phone]   Sahar Ryback 03/17/2021, 1:26 PM

## 2021-03-17 NOTE — Discharge Summary (Signed)
Physician Discharge Summary  Sally Wise JXB:[id number] DOB: 1946/03/07 DOA: 03/14/2021  PCP: [name], MD  Admit date: 03/14/2021 Discharge date: 03/17/2021  Admitted From: Home Disposition: Home  Recommendations for Outpatient Follow-up:  Follow ups as below. Patient has upcoming appointment with gastroenterology for colonoscopy Please obtain CBC/BMP/Mag at follow up Please follow up on the following pending results: None  Home Health: Not indicated Equipment/Devices: Not indicated  Discharge Condition: Stable CODE STATUS: Full code   Follow-up Information     [name], MD. Schedule an appointment as soon as possible for a visit in 1 week(s).   Specialty: Family Medicine Contact information: [address] [place] [address] [phone]                 Hospital Course: 75 year old F with PMH of PAF, morbid obesity, hypothyroidism, anemia and thrombocytopenia presenting with weeks of mucoid loose bowel movements, tenesmus, generalized weakness and dizziness, and admitted for the same.  Work-up in ED without significant finding.  Patient was started on IV fluid.  C. difficile and GI panel ordered, and she was admitted for further care.  The next day, she was a started on azithromycin.  CT abdomen and pelvis without significant finding to explain patient's symptoms.  Gastrointestinal panel positive for EPEC.   On the day of discharge, GI symptoms improved and she felt well and ready to go home.  She has upcoming appointment with gastroenterology for colonoscopy in about 2 weeks.  See individual problem list below for more on hospital course.  Discharge Diagnoses:  Gastroenteritis/mucoid diarrhea-likely due to EPEC.  Not consistent with C. difficile.  Abdominal exam benign.  CT abdomen and pelvis reassuring.  CRP and ESR mildly elevated.  Diarrhea has subsided. -Completed 3 days of azithromycin in-house. -Has upcoming appointment with GI  for colonoscopy in about 2 weeks.   Generalized weakness/dizziness: Likely due to the above.  Improved. -No need identified by therapy.   Hypothyroidism:  -Continue home Synthroid   Paroxysmal A. fib-on p.o. Cardizem CD and as needed flecainide.  Not on anticoagulation. -Continue home medications   Normocytic anemia/anemia of chronic disease: Anemia panel with mild folic acid deficiency.  H&H stable. -Folic acid 1 mg daily -Recheck CBC at follow-up   Chronic Thrombocytopenia.  Relatively stable.   CKD-3A: Stable.   Anxiety and depression: Somewhat anxious. -Continue home BuSpar and Wellbutrin  Right hepatic lesion: 6m hypodensity noted on CT abdomen and pelvis which may represent complex cyst or hemangioma -May consider dedicated MRI examination after his upcoming colonoscopy if indicated  Right middle lung nodule: 8 mm.  Reportedly stable.  She is low risk.  Not a smoker.   Morbid obesity Body mass index is 45.12 kg/m.  -Encourage lifestyle change to lose weight.          Discharge Exam: Vitals:   03/16/21 2040 03/17/21 0510  BP: 119/82 (!) 138/59  Pulse: 68 65  Resp: 18 16  Temp: 98.3 F (36.8 C) 98.1 F (36.7 C)  SpO2: 96% 97%    GENERAL: No apparent distress.  Nontoxic. HEENT: MMM.  Vision and hearing grossly intact.  NECK: Supple.  No apparent JVD.  RESP: On RA.  No IWOB.  Fair aeration bilaterally. CVS:  RRR. Heart sounds normal.  ABD/GI/GU: Bowel sounds present. Soft. Non tender.  MSK/EXT:  Moves extremities. No apparent deformity. No edema.  SKIN: no apparent skin lesion or wound NEURO: Awake, alert and oriented appropriately.  No apparent focal  neuro deficit. PSYCH: Calm. Normal affect.   Discharge Instructions  Discharge Instructions     Call MD for:  extreme fatigue   Complete by: As directed    Call MD for:  persistant dizziness or light-headedness   Complete by: As directed    Call MD for:  persistant nausea and vomiting   Complete  by: As directed    Call MD for:  severe uncontrolled pain   Complete by: As directed    Call MD for:  temperature >100.4   Complete by: As directed    Diet general   Complete by: As directed    Discharge instructions   Complete by: As directed    It has been a pleasure taking care of you!  You were hospitalized with mucoid loose stool likely from bacterial infection.  We have treated you with antibiotics and your symptoms improved to the point we think it is safe to let you go home and follow-up with your primary care doctor and gastroenterologist.  Note that your folic acid level is slightly low and we started you on folic acid tablet.  Please review your new medication list and the directions on your medications before you take them.   Take care,   Increase activity slowly   Complete by: As directed       Allergies as of 03/17/2021   No Known Allergies      Medication List     TAKE these medications    busPIRone 15 MG tablet Commonly known as: BUSPAR Take 15 mg by mouth 2 (two) times daily.   diltiazem 180 MG 24 hr capsule Commonly known as: CARDIZEM CD TAKE 1 CAPSULE BY MOUTH EVERY DAY   flecainide 150 MG tablet Commonly known as: TAMBOCOR Take 2 tablets (300 mg total) by mouth daily as needed (AFib episode).   folic acid 1 MG tablet Commonly known as: FOLVITE Take 1 tablet (1 mg total) by mouth daily.   levothyroxine 112 MCG tablet Commonly known as: SYNTHROID Take 112 mcg by mouth daily before breakfast.   loperamide 2 MG capsule Commonly known as: IMODIUM Take 1 capsule (2 mg total) by mouth every 4 (four) hours as needed for diarrhea or loose stools.   sertraline 50 MG tablet Commonly known as: ZOLOFT Take 50 mg by mouth daily.   Vitamin D3 125 MCG (5000 UT) Tabs Take 1 tablet by mouth daily.        Consultations: None  Procedures/Studies:   CT ABDOMEN PELVIS W CONTRAST  Result Date: 03/15/2021 CLINICAL DATA:  Abdominal abscess/infection  suspected. Dehydration, diarrhea, weakness, nausea. EXAM: CT ABDOMEN AND PELVIS WITH CONTRAST TECHNIQUE: Multidetector CT imaging of the abdomen and pelvis was performed using the standard protocol following bolus administration of intravenous contrast. CONTRAST:  164m OMNIPAQUE IOHEXOL 350 MG/ML SOLN COMPARISON:  07/26/2018 FINDINGS: Lower chest: 8 mm right middle lobe pulmonary nodule is not fully included on this examination, but appears grossly stable since prior chest CT of 04/23/2020. The visualized lung bases are otherwise clear. The visualized heart and pericardium are unremarkable. Hepatobiliary: 10 mm exophytic nodular density arising from the posterior aspect of the right hepatic lobe, axial image 16/2, is stable since remote prior examination of 11/08/2007 and, while indeterminate, is likely benign. Probable tiny cyst within the right hepatic lobe, image # 14/2. 13 mm hypodensity within the right hepatic dome, axial image # 8/2, is indeterminate, not well characterized on this examination. No intra or extrahepatic biliary ductal dilation. Cholelithiasis noted  without pericholecystic inflammatory change. Pancreas: Unremarkable Spleen: 16 mm hypodensity noted within the interpolar region of the spleen, axial image 18/2, indeterminate, but possibly representing a small cyst or hemangioma in a patient without a history of malignancy. The spleen is otherwise unremarkable. Adrenals/Urinary Tract: The adrenal glands are unremarkable. Mild bilateral renal cortical atrophy. Kidneys are normal in position. 2 mm nonobstructing calculus noted within the lower pole of the left kidney. No hydronephrosis. No ureteral calculi. No enhancing intrarenal masses. No perinephric fluid collections. The bladder is unremarkable. Stomach/Bowel: Severe descending and sigmoid colonic diverticulosis. Scattered diverticular seen within the ascending colon. No superimposed inflammatory change. The stomach, small bowel, and large  bowel are otherwise unremarkable. Appendix normal. No free intraperitoneal gas or fluid. Vascular/Lymphatic: Mild aortoiliac atherosclerotic calcification. No aortic aneurysm. No pathologic adenopathy within the abdomen and pelvis. Reproductive: Involuted fibroid within the uterus demonstrating dense calcification. Partially calcified cystic lesion within the right ovary, axial image # 59/2, measures 2.5 x 3.5 cm, not well characterized but stable in size since remote prior examination of 11/08/2007. Other: No abdominal wall hernia. Mild presacral edema is nonspecific. Mild enhancing periportal adenopathy has developed, with the index lymph node seen within the biliary hilum measuring 13 mm in short axis diameter at axial image # 25/2. This is nonspecific and may be reactive in nature. Musculoskeletal: Advanced degenerative changes are seen within the lumbar sign and sacroiliac joints bilaterally. No acute bone abnormality. No lytic or blastic bone lesions are identified. IMPRESSION: No acute intra-abdominal pathology identified. No definite radiographic explanation for the patient's reported symptoms. Indeterminate 16 mm hypodensity within the a right hepatic dome. While this may represent a complex cyst or hemangioma in a patient without a history of malignancy, this is not well characterized on this examination. Given its depth, this may be better assessed with dedicated MRI examination, if indicated. Mild periportal adenopathy. This is nonspecific and can be seen in a number of inflammatory or infectious conditions involving the liver and biliary tree such as hepatitis or cholangitis. Correlation with liver enzymes may be helpful for further evaluation. Cholelithiasis. Minimal left nonobstructing nephrolithiasis. Severe distal colonic diverticulosis without superimposed inflammatory change. Stable right adnexal cyst since remote prior examination of 11/08/2007, not well characterized but safely considered benign  given its stability over time. Aortic Atherosclerosis (ICD10-I70.0). Electronically Signed   By: Fidela Salisbury M.D.   On: 03/15/2021 20:00       The results of significant diagnostics from this hospitalization (including imaging, microbiology, ancillary and laboratory) are listed below for reference.     Microbiology: Recent Results (from the past 240 hour(s))  Resp Panel by RT-PCR (Flu A&B, Covid) Nasopharyngeal Swab     Status: None   Collection Time: 03/14/21  5:34 PM   Specimen: Nasopharyngeal Swab; Nasopharyngeal(NP) swabs in vial transport medium  Result Value Ref Range Status   SARS Coronavirus 2 by RT PCR NEGATIVE NEGATIVE Final    Comment: (NOTE) SARS-CoV-2 target nucleic acids are NOT DETECTED.  The SARS-CoV-2 RNA is generally detectable in upper respiratory specimens during the acute phase of infection. The lowest concentration of SARS-CoV-2 viral copies this assay can detect is 138 copies/mL. A negative result does not preclude SARS-Cov-2 infection and should not be used as the sole basis for treatment or other patient management decisions. A negative result may occur with  improper specimen collection/handling, submission of specimen other than nasopharyngeal swab, presence of viral mutation(s) within the areas targeted by this assay, and inadequate number of viral  copies(<138 copies/mL). A negative result must be combined with clinical observations, patient history, and epidemiological information. The expected result is Negative.  Fact Sheet for Patients:  EntrepreneurPulse.com.au  Fact Sheet for Healthcare Providers:  IncredibleEmployment.be  This test is no t yet approved or cleared by the [place] FDA and  has been authorized for detection and/or diagnosis of SARS-CoV-2 by FDA under an Emergency Use Authorization (EUA). This EUA will remain  in effect (meaning this test can be used) for the duration of the COVID-19  declaration under Section 564(b)(1) of the Act, 21 U.S.C.section 360bbb-3(b)(1), unless the authorization is terminated  or revoked sooner.       Influenza A by PCR NEGATIVE NEGATIVE Final   Influenza B by PCR NEGATIVE NEGATIVE Final    Comment: (NOTE) The Xpert Xpress SARS-CoV-2/FLU/RSV plus assay is intended as an aid in the diagnosis of influenza from Nasopharyngeal swab specimens and should not be used as a sole basis for treatment. Nasal washings and aspirates are unacceptable for Xpert Xpress SARS-CoV-2/FLU/RSV testing.  Fact Sheet for Patients: EntrepreneurPulse.com.au  Fact Sheet for Healthcare Providers: IncredibleEmployment.be  This test is not yet approved or cleared by the [place] FDA and has been authorized for detection and/or diagnosis of SARS-CoV-2 by FDA under an Emergency Use Authorization (EUA). This EUA will remain in effect (meaning this test can be used) for the duration of the COVID-19 declaration under Section 564(b)(1) of the Act, 21 U.S.C. section 360bbb-3(b)(1), unless the authorization is terminated or revoked.  Performed at [facility], [place] [address], [place], [place] [address]   Gastrointestinal Panel by PCR , Stool     Status: Abnormal   Collection Time: 03/15/21  1:24 PM   Specimen: Stool  Result Value Ref Range Status   Campylobacter species NOT DETECTED NOT DETECTED Final   Plesimonas shigelloides NOT DETECTED NOT DETECTED Final   Salmonella species NOT DETECTED NOT DETECTED Final   Yersinia enterocolitica NOT DETECTED NOT DETECTED Final   Vibrio species NOT DETECTED NOT DETECTED Final   Vibrio cholerae NOT DETECTED NOT DETECTED Final   Enteroaggregative E coli (EAEC) NOT DETECTED NOT DETECTED Final   Enteropathogenic E coli (EPEC) DETECTED (A) NOT DETECTED Final    Comment: RESULT CALLED TO, READ BACK BY AND VERIFIED WITH: CINDY HUGHEY 03/16/21 1645 AMK    Enterotoxigenic E  coli (ETEC) NOT DETECTED NOT DETECTED Final   Shiga like toxin producing E coli (STEC) NOT DETECTED NOT DETECTED Final   Shigella/Enteroinvasive E coli (EIEC) NOT DETECTED NOT DETECTED Final   Cryptosporidium NOT DETECTED NOT DETECTED Final   Cyclospora cayetanensis NOT DETECTED NOT DETECTED Final   Entamoeba histolytica NOT DETECTED NOT DETECTED Final   Giardia lamblia NOT DETECTED NOT DETECTED Final   Adenovirus F40/41 NOT DETECTED NOT DETECTED Final   Astrovirus NOT DETECTED NOT DETECTED Final   Norovirus GI/GII NOT DETECTED NOT DETECTED Final   Rotavirus A NOT DETECTED NOT DETECTED Final   Sapovirus (I, II, IV, and V) NOT DETECTED NOT DETECTED Final    Comment: Performed at [facility], [address] [place], [place] [address]     Labs:  CBC: Recent Labs  Lab 03/14/21 1609 03/14/21 2014 03/15/21 0605 03/16/21 0715 03/17/21 0618  WBC 5.5 5.4 4.7 4.3 4.5  NEUTROABS 2.6  --   --   --   --   HGB 10.4* 10.6* 9.7* 9.6* 9.5*  HCT 33.5* 33.2* 31.0* 29.9* 30.1*  MCV 96.8 93.5 93.9 92.6 93.5  PLT 116* 120* 113* 106* 107*   BMP &GFR Recent Labs  Lab 03/14/21 1609 03/14/21 2014 03/15/21 0605 03/16/21 0715 03/17/21 0618  NA 139  --  140 136 134*  K 3.5  --  3.6 3.7 3.6  CL 107  --  107 105 102  CO2 22  --  _0 GLUCOSE 109*  --  91 91 92  BUN 20  --  _1 CREATININE 1.19* 1.16* 1.10* 0.97 1.00  CALCIUM 8.6*  --  8.8* 8.7* 8.3*  MG 2.2  --   --  2.0 1.9  PHOS  --   --   --  4.1 3.8   Estimated Creatinine Clearance: 68.6 mL/min (by C-G formula based on SCr of 1 mg/dL). Liver & Pancreas: Recent Labs  Lab 03/14/21 1609 03/15/21 0605 03/16/21 0715 03/17/21 0618  AST 11* 8*  --   --   ALT 8 9  --   --   ALKPHOS 164* 155*  --   --   BILITOT 0.5 0.5  --   --   PROT 6.9 6.5  --   --   ALBUMIN 3.3* 3.1* 3.1* 2.9*   No results for input(s): LIPASE, AMYLASE in the last 168 hours. No results for input(s): AMMONIA in the last 168  hours. Diabetic: No results for input(s): HGBA1C in the last 72 hours. No results for input(s): GLUCAP in the last 168 hours. Cardiac Enzymes: No results for input(s): CKTOTAL, CKMB, CKMBINDEX, TROPONINI in the last 168 hours. No results for input(s): PROBNP in the last 8760 hours. Coagulation Profile: No results for input(s): INR, PROTIME in the last 168 hours. Thyroid Function Tests: No results for input(s): TSH, T4TOTAL, FREET4, T3FREE, THYROIDAB in the last 72 hours. Lipid Profile: No results for input(s): CHOL, HDL, LDLCALC, TRIG, CHOLHDL, LDLDIRECT in the last 72 hours. Anemia Panel: Recent Labs    03/15/21 0604 03/15/21 1030  VITAMINB12 2,371*  --   FOLATE 4.2*  --   FERRITIN 165  --   TIBC 187*  --   IRON 38  --   RETICCTPCT  --  2.2   Urine analysis:    Component Value Date/Time   COLORURINE GREEN (A) 06/05/2020 1800   APPEARANCEUR CLEAR 06/05/2020 1800   LABSPEC 1.015 06/05/2020 1800   PHURINE 5.5 06/05/2020 1800   GLUCOSEU NEGATIVE 06/05/2020 1800   HGBUR TRACE (A) 06/05/2020 1800   BILIRUBINUR NEGATIVE 06/05/2020 1800   BILIRUBINUR neg 04/08/2016 0916   KETONESUR NEGATIVE 06/05/2020 1800   PROTEINUR NEGATIVE 06/05/2020 1800   UROBILINOGEN negative 04/08/2016 0916   UROBILINOGEN 0.2 01/15/2013 1538   NITRITE NEGATIVE 06/05/2020 1800   LEUKOCYTESUR NEGATIVE 06/05/2020 1800   Sepsis Labs: Invalid input(s): PROCALCITONIN, LACTICIDVEN   Time coordinating discharge: 35 minutes  SIGNED:  Mercy Riding, MD  [facility] 03/17/2021, 3:51 PM  If 7PM-7AM, please contact night-coverage [email or web address]

## 2021-03-17 NOTE — Evaluation (Signed)
Occupational Therapy Evaluation Patient Details Name: Sally Wise MRN: [id number] DOB: Nov 19, 1945 Today's Date: 03/17/2021    History of Present Illness patient is a 75 year old female who presented with weakness and loose bowels. patient was found to have gastroenteritis and hypothyroidism. PMH: PAF, CHF, hypothyroidism, bilateral TKA, obesity.   Clinical Impression   Patient is a 75 year old female who was admitted for above. Patient indicated being at baseline for ADLs with patient able to participate in bed mobility, transfers, functional mobility, LB Dressing, and simulations of toileting hygiene while standing. Patient to transition home with husband support 24/7 per patient report. Patient is at functional level to transition home if patient has 24/7 supervision available.     Follow Up Recommendations  Supervision/Assistance - 24 hour    Equipment Recommendations  None recommended by OT    Recommendations for Other Services       Precautions / Restrictions Restrictions Weight Bearing Restrictions: No      Mobility Bed Mobility Overal bed mobility: Needs Assistance Bed Mobility: Supine to Sit     Supine to sit: Min assist     General bed mobility comments: patient requested for [facility] to be raised for supine to sit with use of bed rail for supine to sit on edge of bed. patient reported that she had bed that HOB elevated but she did not use it. patient was noted to get a little frustrated with questions reguarding assistance levels for ADLs at home.    Transfers   Equipment used: Rolling walker (2 wheeled)                  Balance Overall balance assessment: Modified Independent                                         ADL either performed or assessed with clinical judgement   ADL Overall ADL's : At baseline                                       General ADL Comments: patient is at baseline for ADLs with patient  encourcing that she is able to complete toileting hygiene tasks and demonstrating in standing the ability to maintain standing balance with MI and reach behind back to perineal areas. patient was able to adjust socks sitting on edge of bed. patient was slightly frustrated with questions reguarding prior level of functioning at this time. patient endourced that she is ready to go home and has family support in next level of care. patient reported having outpatient PT set up at this time that she wants to start going to.     Vision Patient Visual Report: No change from baseline       Perception     Praxis      Pertinent Vitals/Pain Pain Assessment: No/denies pain     Hand Dominance Right   Extremity/Trunk Assessment Upper Extremity Assessment Upper Extremity Assessment: Overall WFL for tasks assessed   Lower Extremity Assessment Lower Extremity Assessment: Defer to PT evaluation   Cervical / Trunk Assessment Cervical / Trunk Assessment: Normal   Communication Communication Communication: No difficulties   Cognition Arousal/Alertness: Awake/alert Behavior During Therapy: WFL for tasks assessed/performed Overall Cognitive Status: Within Functional Limits for tasks assessed  General Comments       Exercises     Shoulder Instructions      Home Living Family/patient expects to be discharged to:: Private residence Living Arrangements: Spouse/significant other Available Help at Discharge: Family Type of Home: House Home Access: Stairs to enter Secretary/administrator of Steps: 3   Home Layout: One level     Bathroom Shower/Tub: Producer, television/film/video: Handicapped height     Home Equipment: Cane - single point;Grab bars - tub/shower;Walker - 2 wheels   Additional Comments: reports HOB is adjustable but she does not need/ use it at home      Prior Functioning/Environment Level of Independence: Independent         Comments: patient does not need device in home. patient reported using walker most recently in community.        OT Problem List: Obesity      OT Treatment/Interventions:      OT Goals(Current goals can be found in the care plan section) Acute Rehab OT Goals Patient Stated Goal: to go home today  OT Frequency:     Barriers to D/C:            Co-evaluation PT/OT/SLP Co-Evaluation/Treatment: Yes Reason for Co-Treatment: To address functional/ADL transfers PT goals addressed during session: Mobility/safety with mobility OT goals addressed during session: ADL's and self-care      AM-PAC OT "6 Clicks" Daily Activity     Outcome Measure Help from another person eating meals?: None Help from another person taking care of personal grooming?: None Help from another person toileting, which includes using toliet, bedpan, or urinal?: A Little Help from another person bathing (including washing, rinsing, drying)?: A Little Help from another person to put on and taking off regular upper body clothing?: None Help from another person to put on and taking off regular lower body clothing?: A Little 6 Click Score: 21   End of Session Equipment Utilized During Treatment: Gait belt;Rolling walker Nurse Communication: Other (comment) (nurse cleared patient to participate)  Activity Tolerance: Patient tolerated treatment well Patient left: in chair;with call bell/phone within reach;with chair alarm set  OT Visit Diagnosis: Unsteadiness on feet (R26.81);Muscle weakness (generalized) (M62.81)                Time: 8101-7510 OT Time Calculation (min): 16 min Charges:  OT General Charges $OT Visit: 1 Visit OT Evaluation $OT Eval Low Complexity: 1 Low  Sharyn Blitz OTR/L, MS Acute Rehabilitation Department Office# [phone] Pager# [phone]   Chalmers Guest Celestina Gironda 03/17/2021, 9:29 AM

## 2021-03-17 NOTE — Plan of Care (Signed)

## 2021-03-17 NOTE — Progress Notes (Signed)
Patient discharged to home with family, discharge instructions reviewed with patient who verbalized understanding. 

## 2021-03-19 ENCOUNTER — Encounter: Payer: Medicare Other | Admitting: Physical Therapy

## 2021-03-22 ENCOUNTER — Encounter: Payer: Medicare Other | Admitting: Physical Therapy

## 2021-03-25 ENCOUNTER — Encounter: Payer: Medicare Other | Admitting: Physical Therapy

## 2021-03-26 DIAGNOSIS — R198 Other specified symptoms and signs involving the digestive system and abdomen: Secondary | ICD-10-CM | POA: Diagnosis not present

## 2021-03-26 DIAGNOSIS — R531 Weakness: Secondary | ICD-10-CM | POA: Diagnosis not present

## 2021-03-26 DIAGNOSIS — Z86010 Personal history of colonic polyps: Secondary | ICD-10-CM | POA: Diagnosis not present

## 2021-03-29 DIAGNOSIS — K5730 Diverticulosis of large intestine without perforation or abscess without bleeding: Secondary | ICD-10-CM | POA: Diagnosis not present

## 2021-03-29 DIAGNOSIS — R194 Change in bowel habit: Secondary | ICD-10-CM | POA: Diagnosis not present

## 2021-04-01 ENCOUNTER — Encounter: Payer: Medicare Other | Admitting: Physical Therapy

## 2021-04-02 DIAGNOSIS — R194 Change in bowel habit: Secondary | ICD-10-CM | POA: Diagnosis not present

## 2021-04-04 ENCOUNTER — Encounter: Payer: Medicare Other | Admitting: Physical Therapy

## 2021-04-08 ENCOUNTER — Encounter: Payer: Medicare Other | Admitting: Physical Therapy

## 2021-04-09 ENCOUNTER — Encounter: Payer: Medicare Other | Admitting: Physical Therapy

## 2021-04-10 ENCOUNTER — Ambulatory Visit: Payer: Medicare Other | Admitting: Oncology

## 2021-04-10 ENCOUNTER — Other Ambulatory Visit: Payer: Medicare Other

## 2021-04-16 ENCOUNTER — Encounter: Payer: Medicare Other | Admitting: Physical Therapy

## 2021-04-18 ENCOUNTER — Encounter: Payer: Medicare Other | Admitting: Physical Therapy

## 2021-04-22 ENCOUNTER — Encounter: Payer: Medicare Other | Admitting: Physical Therapy

## 2021-04-25 ENCOUNTER — Encounter: Payer: Medicare Other | Admitting: Physical Therapy

## 2021-04-25 DIAGNOSIS — E039 Hypothyroidism, unspecified: Secondary | ICD-10-CM | POA: Diagnosis not present

## 2021-04-28 ENCOUNTER — Inpatient Hospital Stay (HOSPITAL_COMMUNITY): Payer: Medicare Other

## 2021-04-28 ENCOUNTER — Other Ambulatory Visit: Payer: Self-pay

## 2021-04-28 ENCOUNTER — Encounter (HOSPITAL_COMMUNITY): Payer: Self-pay | Admitting: Emergency Medicine

## 2021-04-28 ENCOUNTER — Emergency Department (HOSPITAL_COMMUNITY): Payer: Medicare Other

## 2021-04-28 ENCOUNTER — Inpatient Hospital Stay (HOSPITAL_COMMUNITY)
Admission: EM | Admit: 2021-04-28 | Discharge: 2021-05-13 | DRG: 493 | Disposition: A | Discharge status: Skilled Nursing Facility | Payer: Medicare Other | Attending: Family Medicine | Admitting: Family Medicine

## 2021-04-28 DIAGNOSIS — R195 Other fecal abnormalities: Secondary | ICD-10-CM | POA: Diagnosis present

## 2021-04-28 DIAGNOSIS — Y9301 Activity, walking, marching and hiking: Secondary | ICD-10-CM | POA: Diagnosis present

## 2021-04-28 DIAGNOSIS — Z8249 Family history of ischemic heart disease and other diseases of the circulatory system: Secondary | ICD-10-CM

## 2021-04-28 DIAGNOSIS — N179 Acute kidney failure, unspecified: Secondary | ICD-10-CM

## 2021-04-28 DIAGNOSIS — S82852A Displaced trimalleolar fracture of left lower leg, initial encounter for closed fracture: Secondary | ICD-10-CM | POA: Diagnosis not present

## 2021-04-28 DIAGNOSIS — W1839XA Other fall on same level, initial encounter: Secondary | ICD-10-CM | POA: Diagnosis present

## 2021-04-28 DIAGNOSIS — R531 Weakness: Secondary | ICD-10-CM | POA: Diagnosis not present

## 2021-04-28 DIAGNOSIS — I5032 Chronic diastolic (congestive) heart failure: Secondary | ICD-10-CM | POA: Diagnosis present

## 2021-04-28 DIAGNOSIS — Z9189 Other specified personal risk factors, not elsewhere classified: Secondary | ICD-10-CM

## 2021-04-28 DIAGNOSIS — M7989 Other specified soft tissue disorders: Secondary | ICD-10-CM | POA: Diagnosis not present

## 2021-04-28 DIAGNOSIS — Z6841 Body Mass Index (BMI) 40.0 and over, adult: Secondary | ICD-10-CM

## 2021-04-28 DIAGNOSIS — K7689 Other specified diseases of liver: Secondary | ICD-10-CM | POA: Diagnosis present

## 2021-04-28 DIAGNOSIS — R532 Functional quadriplegia: Secondary | ICD-10-CM | POA: Diagnosis present

## 2021-04-28 DIAGNOSIS — Z20822 Contact with and (suspected) exposure to covid-19: Secondary | ICD-10-CM | POA: Diagnosis not present

## 2021-04-28 DIAGNOSIS — M25572 Pain in left ankle and joints of left foot: Secondary | ICD-10-CM | POA: Diagnosis not present

## 2021-04-28 DIAGNOSIS — I2510 Atherosclerotic heart disease of native coronary artery without angina pectoris: Secondary | ICD-10-CM | POA: Diagnosis present

## 2021-04-28 DIAGNOSIS — Y92002 Bathroom of unspecified non-institutional (private) residence single-family (private) house as the place of occurrence of the external cause: Secondary | ICD-10-CM

## 2021-04-28 DIAGNOSIS — D62 Acute posthemorrhagic anemia: Secondary | ICD-10-CM | POA: Diagnosis not present

## 2021-04-28 DIAGNOSIS — S82899A Other fracture of unspecified lower leg, initial encounter for closed fracture: Secondary | ICD-10-CM | POA: Diagnosis present

## 2021-04-28 DIAGNOSIS — K8020 Calculus of gallbladder without cholecystitis without obstruction: Secondary | ICD-10-CM | POA: Diagnosis present

## 2021-04-28 DIAGNOSIS — D649 Anemia, unspecified: Secondary | ICD-10-CM | POA: Diagnosis not present

## 2021-04-28 DIAGNOSIS — D696 Thrombocytopenia, unspecified: Secondary | ICD-10-CM | POA: Diagnosis present

## 2021-04-28 DIAGNOSIS — N1831 Chronic kidney disease, stage 3a: Secondary | ICD-10-CM | POA: Diagnosis present

## 2021-04-28 DIAGNOSIS — Z79890 Hormone replacement therapy (postmenopausal): Secondary | ICD-10-CM

## 2021-04-28 DIAGNOSIS — E785 Hyperlipidemia, unspecified: Secondary | ICD-10-CM | POA: Diagnosis present

## 2021-04-28 DIAGNOSIS — Z87442 Personal history of urinary calculi: Secondary | ICD-10-CM

## 2021-04-28 DIAGNOSIS — Z841 Family history of disorders of kidney and ureter: Secondary | ICD-10-CM

## 2021-04-28 DIAGNOSIS — I480 Paroxysmal atrial fibrillation: Secondary | ICD-10-CM | POA: Diagnosis present

## 2021-04-28 DIAGNOSIS — K5909 Other constipation: Secondary | ICD-10-CM | POA: Diagnosis present

## 2021-04-28 DIAGNOSIS — K529 Noninfective gastroenteritis and colitis, unspecified: Secondary | ICD-10-CM | POA: Diagnosis not present

## 2021-04-28 DIAGNOSIS — E559 Vitamin D deficiency, unspecified: Secondary | ICD-10-CM | POA: Diagnosis present

## 2021-04-28 DIAGNOSIS — I517 Cardiomegaly: Secondary | ICD-10-CM | POA: Diagnosis not present

## 2021-04-28 DIAGNOSIS — R748 Abnormal levels of other serum enzymes: Secondary | ICD-10-CM

## 2021-04-28 DIAGNOSIS — R9431 Abnormal electrocardiogram [ECG] [EKG]: Secondary | ICD-10-CM | POA: Diagnosis not present

## 2021-04-28 DIAGNOSIS — I700 Atherosclerosis of aorta: Secondary | ICD-10-CM | POA: Diagnosis not present

## 2021-04-28 DIAGNOSIS — L900 Lichen sclerosus et atrophicus: Secondary | ICD-10-CM | POA: Diagnosis present

## 2021-04-28 DIAGNOSIS — W19XXXA Unspecified fall, initial encounter: Secondary | ICD-10-CM

## 2021-04-28 DIAGNOSIS — Z01818 Encounter for other preprocedural examination: Secondary | ICD-10-CM | POA: Diagnosis not present

## 2021-04-28 DIAGNOSIS — Z96653 Presence of artificial knee joint, bilateral: Secondary | ICD-10-CM | POA: Diagnosis present

## 2021-04-28 DIAGNOSIS — R791 Abnormal coagulation profile: Secondary | ICD-10-CM | POA: Diagnosis present

## 2021-04-28 DIAGNOSIS — Z01810 Encounter for preprocedural cardiovascular examination: Secondary | ICD-10-CM | POA: Diagnosis not present

## 2021-04-28 DIAGNOSIS — S82892A Other fracture of left lower leg, initial encounter for closed fracture: Secondary | ICD-10-CM

## 2021-04-28 DIAGNOSIS — Z23 Encounter for immunization: Secondary | ICD-10-CM | POA: Diagnosis not present

## 2021-04-28 DIAGNOSIS — S82892D Other fracture of left lower leg, subsequent encounter for closed fracture with routine healing: Secondary | ICD-10-CM | POA: Diagnosis not present

## 2021-04-28 DIAGNOSIS — I130 Hypertensive heart and chronic kidney disease with heart failure and stage 1 through stage 4 chronic kidney disease, or unspecified chronic kidney disease: Secondary | ICD-10-CM | POA: Diagnosis not present

## 2021-04-28 DIAGNOSIS — K760 Fatty (change of) liver, not elsewhere classified: Secondary | ICD-10-CM | POA: Diagnosis present

## 2021-04-28 DIAGNOSIS — Z9181 History of falling: Secondary | ICD-10-CM

## 2021-04-28 DIAGNOSIS — Z79899 Other long term (current) drug therapy: Secondary | ICD-10-CM

## 2021-04-28 DIAGNOSIS — R001 Bradycardia, unspecified: Secondary | ICD-10-CM | POA: Diagnosis not present

## 2021-04-28 DIAGNOSIS — J9601 Acute respiratory failure with hypoxia: Secondary | ICD-10-CM | POA: Diagnosis not present

## 2021-04-28 DIAGNOSIS — E039 Hypothyroidism, unspecified: Secondary | ICD-10-CM | POA: Diagnosis present

## 2021-04-28 DIAGNOSIS — F32A Depression, unspecified: Secondary | ICD-10-CM | POA: Diagnosis not present

## 2021-04-28 DIAGNOSIS — R69 Illness, unspecified: Secondary | ICD-10-CM

## 2021-04-28 DIAGNOSIS — S82852D Displaced trimalleolar fracture of left lower leg, subsequent encounter for closed fracture with routine healing: Secondary | ICD-10-CM | POA: Diagnosis not present

## 2021-04-28 DIAGNOSIS — R140 Abdominal distension (gaseous): Secondary | ICD-10-CM | POA: Diagnosis not present

## 2021-04-28 DIAGNOSIS — I10 Essential (primary) hypertension: Secondary | ICD-10-CM | POA: Diagnosis not present

## 2021-04-28 DIAGNOSIS — I959 Hypotension, unspecified: Secondary | ICD-10-CM | POA: Diagnosis not present

## 2021-04-28 LAB — PROTIME-INR
INR: 1.5 — ABNORMAL HIGH (ref 0.8–1.2)
Prothrombin Time: 18 seconds — ABNORMAL HIGH (ref 11.4–15.2)

## 2021-04-28 LAB — COMPREHENSIVE METABOLIC PANEL
ALT: 11 U/L (ref 0–44)
AST: 11 U/L — ABNORMAL LOW (ref 15–41)
Albumin: 2.9 g/dL — ABNORMAL LOW (ref 3.5–5.0)
Alkaline Phosphatase: 269 U/L — ABNORMAL HIGH (ref 38–126)
Anion gap: 6 (ref 5–15)
BUN: 21 mg/dL (ref 8–23)
CO2: 24 mmol/L (ref 22–32)
Calcium: 8.1 mg/dL — ABNORMAL LOW (ref 8.9–10.3)
Chloride: 105 mmol/L (ref 98–111)
Creatinine, Ser: 1.36 mg/dL — ABNORMAL HIGH (ref 0.44–1.00)
GFR, Estimated: 40 mL/min — ABNORMAL LOW (ref >60)
Glucose, Bld: 113 mg/dL — ABNORMAL HIGH (ref 70–99)
Potassium: 4.3 mmol/L (ref 3.5–5.1)
Sodium: 135 mmol/L (ref 135–145)
Total Bilirubin: 0.8 mg/dL (ref 0.3–1.2)
Total Protein: 6.4 g/dL — ABNORMAL LOW (ref 6.5–8.1)

## 2021-04-28 LAB — CBC WITH DIFFERENTIAL/PLATELET
Abs Immature Granulocytes: 0.04 10*3/uL (ref 0.00–0.07)
Basophils Absolute: 0 10*3/uL (ref 0.0–0.1)
Basophils Relative: 0 %
Eosinophils Absolute: 0.3 10*3/uL (ref 0.0–0.5)
Eosinophils Relative: 5 %
HCT: 30 % — ABNORMAL LOW (ref 36.0–46.0)
Hemoglobin: 9.3 g/dL — ABNORMAL LOW (ref 12.0–15.0)
Immature Granulocytes: 1 %
Lymphocytes Relative: 20 %
Lymphs Abs: 1.2 10*3/uL (ref 0.7–4.0)
MCH: 29.2 pg (ref 26.0–34.0)
MCHC: 31 g/dL (ref 30.0–36.0)
MCV: 94.3 fL (ref 80.0–100.0)
Monocytes Absolute: 1 10*3/uL (ref 0.1–1.0)
Monocytes Relative: 17 %
Neutro Abs: 3.5 10*3/uL (ref 1.7–7.7)
Neutrophils Relative %: 57 %
Platelets: 101 10*3/uL — ABNORMAL LOW (ref 150–400)
RBC: 3.18 MIL/uL — ABNORMAL LOW (ref 3.87–5.11)
RDW: 19.9 % — ABNORMAL HIGH (ref 11.5–15.5)
WBC: 6.1 10*3/uL (ref 4.0–10.5)
nRBC: 0 % (ref 0.0–0.2)

## 2021-04-28 LAB — RESP PANEL BY RT-PCR (FLU A&B, COVID) ARPGX2
Influenza A by PCR: NEGATIVE
Influenza B by PCR: NEGATIVE
SARS Coronavirus 2 by RT PCR: NEGATIVE

## 2021-04-28 LAB — APTT: aPTT: 35 seconds (ref 24–36)

## 2021-04-28 LAB — CK: Total CK: 51 U/L (ref 38–234)

## 2021-04-28 LAB — LIPASE, BLOOD: Lipase: 22 U/L (ref 11–51)

## 2021-04-28 LAB — PHOSPHORUS: Phosphorus: 3.9 mg/dL (ref 2.5–4.6)

## 2021-04-28 LAB — TROPONIN I (HIGH SENSITIVITY)
Troponin I (High Sensitivity): 5 ng/L (ref <18)
Troponin I (High Sensitivity): 5 ng/L (ref <18)

## 2021-04-28 LAB — MAGNESIUM: Magnesium: 2 mg/dL (ref 1.7–2.4)

## 2021-04-28 LAB — GAMMA GT: GGT: 28 U/L (ref 7–50)

## 2021-04-28 MED ORDER — PROPOFOL 10 MG/ML IV BOLUS
INTRAVENOUS | Status: AC | PRN
  Administered 2021-04-28: 20 mg via INTRAVENOUS

## 2021-04-28 MED ORDER — DILTIAZEM HCL ER COATED BEADS 180 MG PO CP24
180.0000 mg | ORAL_CAPSULE | Freq: Every day | ORAL | Status: DC
  Administered 2021-04-30 – 2021-05-13 (×13): 180 mg via ORAL
  Filled 2021-04-28 (×15): qty 1

## 2021-04-28 MED ORDER — SODIUM CHLORIDE 0.9 % IV SOLN: Freq: Once | INTRAVENOUS | Status: AC

## 2021-04-28 MED ORDER — TRAZODONE HCL 50 MG PO TABS
50.0000 mg | ORAL_TABLET | Freq: Once | ORAL | Status: AC
  Administered 2021-04-28: 50 mg via ORAL
  Filled 2021-04-28: qty 1

## 2021-04-28 MED ORDER — METHOCARBAMOL 1000 MG/10ML IJ SOLN
500.0000 mg | Freq: Four times a day (QID) | INTRAVENOUS | Status: DC | PRN
  Filled 2021-04-28: qty 5

## 2021-04-28 MED ORDER — BUSPIRONE HCL 5 MG PO TABS
15.0000 mg | ORAL_TABLET | Freq: Two times a day (BID) | ORAL | Status: DC
  Administered 2021-04-28 – 2021-05-13 (×29): 15 mg via ORAL
  Filled 2021-04-28 (×30): qty 1

## 2021-04-28 MED ORDER — SERTRALINE HCL 50 MG PO TABS
50.0000 mg | ORAL_TABLET | Freq: Every day | ORAL | Status: DC
  Administered 2021-04-29 – 2021-05-13 (×14): 50 mg via ORAL
  Filled 2021-04-28 (×15): qty 1

## 2021-04-28 MED ORDER — LEVOTHYROXINE SODIUM 112 MCG PO TABS
112.0000 ug | ORAL_TABLET | Freq: Every day | ORAL | Status: DC
  Administered 2021-04-29 – 2021-05-13 (×15): 112 ug via ORAL
  Filled 2021-04-28 (×15): qty 1

## 2021-04-28 MED ORDER — FENTANYL CITRATE PF 50 MCG/ML IJ SOSY
50.0000 ug | PREFILLED_SYRINGE | Freq: Once | INTRAMUSCULAR | Status: DC
Start: 2021-04-28 — End: 2021-04-28

## 2021-04-28 MED ORDER — HYDROMORPHONE HCL 1 MG/ML IJ SOLN
0.5000 mg | INTRAMUSCULAR | Status: DC | PRN
  Administered 2021-04-29 – 2021-04-30 (×4): 1 mg via INTRAVENOUS
  Filled 2021-04-28 (×4): qty 1

## 2021-04-28 MED ORDER — METHOCARBAMOL 500 MG PO TABS
500.0000 mg | ORAL_TABLET | Freq: Four times a day (QID) | ORAL | Status: DC | PRN
  Administered 2021-05-02 – 2021-05-06 (×8): 500 mg via ORAL
  Filled 2021-04-28 (×8): qty 1

## 2021-04-28 MED ORDER — HYDROMORPHONE HCL 1 MG/ML IJ SOLN
0.5000 mg | Freq: Once | INTRAMUSCULAR | Status: AC
  Administered 2021-04-28: 0.5 mg via INTRAVENOUS
  Filled 2021-04-28: qty 1

## 2021-04-28 MED ORDER — SODIUM CHLORIDE 0.9 % IV SOLN: INTRAVENOUS | Status: DC

## 2021-04-28 MED ORDER — ONDANSETRON HCL 4 MG/2ML IJ SOLN
4.0000 mg | Freq: Once | INTRAMUSCULAR | Status: AC
  Administered 2021-04-28: 4 mg via INTRAVENOUS
  Filled 2021-04-28: qty 2

## 2021-04-28 MED ORDER — PROPOFOL 10 MG/ML IV BOLUS
0.5000 mg/kg | Freq: Once | INTRAVENOUS | Status: AC
  Administered 2021-04-28: 40 mg via INTRAVENOUS
  Filled 2021-04-28: qty 20

## 2021-04-28 MED ORDER — HYDROCODONE-ACETAMINOPHEN 5-325 MG PO TABS
1.0000 | ORAL_TABLET | Freq: Four times a day (QID) | ORAL | Status: DC | PRN
  Administered 2021-04-28 – 2021-04-29 (×2): 2 via ORAL
  Administered 2021-04-29: 1 via ORAL
  Administered 2021-04-30 – 2021-05-02 (×6): 2 via ORAL
  Administered 2021-05-03: 1 via ORAL
  Administered 2021-05-03 – 2021-05-04 (×3): 2 via ORAL
  Administered 2021-05-04: 1 via ORAL
  Administered 2021-05-05 (×2): 2 via ORAL
  Administered 2021-05-06 (×2): 1 via ORAL
  Filled 2021-04-28: qty 1
  Filled 2021-04-28 (×11): qty 2
  Filled 2021-04-28: qty 1
  Filled 2021-04-28: qty 2
  Filled 2021-04-28 (×2): qty 1
  Filled 2021-04-28: qty 2
  Filled 2021-04-28: qty 1
  Filled 2021-04-28: qty 2

## 2021-04-28 NOTE — ED Triage Notes (Signed)
BIB EMS from home after sustaining a fall in the bathroom with obvious deformity to left ankle. Pt denies LOC and denies additional injury. GCS 15, no blood thinners, A/O x 4 on arrival. Received fentanyl and NS bolus en route and vomited once immediately prior to arrival.

## 2021-04-28 NOTE — ED Provider Notes (Signed)
  Physical Exam  BP (!) 143/119   Pulse 68   Temp 97.8 F (36.6 C) (Oral)   Resp 19   Ht 1.727 m (5\' 8" )   Wt 134.7 kg   LMP 07/14/2001   SpO2 100%   BMI 45.16 kg/m   Physical Exam  ED Course/Procedures     .Sedation  Date/Time: 04/28/2021 6:12 PM Performed by: 04/30/2021, MD Authorized by: Lorre Nick, MD   Consent:    Consent obtained:  Written   Consent given by:  Patient   Alternatives discussed:  Analgesia without sedation Universal protocol:    Immediately prior to procedure, a time out was called: yes     Patient identity confirmed:  Arm band Pre-sedation assessment:    Time since last food or drink:  5   ASA classification: class 2 - patient with mild systemic disease     Mouth opening:  3 or more finger widths   Mallampati score:  I - soft palate, uvula, fauces, pillars visible   Neck mobility: normal     Pre-sedation assessments completed and reviewed: airway patency     Pre-sedation assessment completed:  04/28/2021 5:55 PM Immediate pre-procedure details:    Reassessment: Patient reassessed immediately prior to procedure   Procedure details (see MAR for exact dosages):    Preoxygenation:  Room air   Sedation:  Propofol   Intended level of sedation: deep   Intra-procedure monitoring:  Blood pressure monitoring and cardiac monitor   Intra-procedure events: none     Intra-procedure management:  Airway repositioning   Total Provider sedation time (minutes):  15 Post-procedure details:    Post-sedation assessment completed:  04/28/2021 6:13 PM   Attendance: Constant attendance by certified staff until patient recovered     Recovery: Patient returned to pre-procedure baseline     Patient is stable for discharge or admission: yes     Procedure completion:  Tolerated well, no immediate complications  MDM         04/30/2021, MD 04/28/21 1813

## 2021-04-28 NOTE — ED Provider Notes (Signed)
[facility] [place] [facility]EMERGENCY DEPT Provider Note   CSN: [id number] Arrival date & time: 04/28/21  1459     History Chief Complaint  Patient presents with   Ankle Pain   Fall    Sally Wise is a 75 y.o. female.  HPI Patient reports that she fell after becoming very weak this morning.  Reports at baseline she uses a walker.  She reports she has been suffering from some generalized weakness off and on for a number of months.  This morning she went to the bathroom and after she got up and was trying to head back to her bedroom, she knew that she was getting weak and at risk of falling.  She was using her walker for support.  Nonetheless, she could not hold her weight and collapsed to the ground.  She denies she sustained injury other than her left ankle which is significantly deformed.  She reports that she did bump her head but did not have any loss of consciousness, has no headache or confusion.  She denies any injury to her upper extremities, neck chest or abdomen.  Patient reports her ankle is painful at the joint and especially if it is moved.  Patient was given a dose of pain medication on route from EMS.  Patient reports that she believes it was the pain medication got her nauseated and she vomited once.  Reports she was given Phenergan after that and no longer feels nauseated.  Patient reports that she ate at noon and vomited up the contents of her stomach during her transport by EMS.  Patient is concerned about a persistent GI symptom she has had since August this year at which time she was hospitalized for about 3 days.  She reports she gets early satiety and a lot of rumbling in her stomach.  She reports she also gets a clear gel rectal discharge that is not malodorous or bloody.  She does not get diarrhea.  She reports this has been an ongoing problem and has not had a diagnosis.  She reports she does see Dr. Matthias Hughs and was recommended to use probiotics but the  symptom persists.  She reports she had a sigmoidoscopy but due to her general weakness and physical condition they did not go ahead with a full colonoscopy.    Past Medical History:  Diagnosis Date   Hematuria    evaluation with Dr. Etta Grandchild   Hypothyroidism 06/2010   Kidney stone 04/2004   Lichen sclerosus et atrophicus 2/13   biopsy proven   LVH (left ventricular hypertrophy)    Morbid obesity (HCC)    PAF (paroxysmal atrial fibrillation) (HCC)    a. Remote hx, re-established care 01/2013 with Dr. Eden Emms for recurrence. 2D echo 01/2013: mod LVH, EF 55-65%, no RWMA, grade 1 d/d. b. 06/2014: started on Xarelto.    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   Congestive rheumatic heart failure (HCC) 08/24/2020   Hardening of the aorta (main artery of the heart) (HCC) 08/24/2020   Kidney stone 08/24/2020  Pure hypercholesterolemia 08/24/2020   Scoliosis of thoracic spine 08/24/2020   Solitary pulmonary nodule 08/24/2020   Other long term (current) drug therapy 08/24/2020   Abnormal findings on diagnostic imaging of other specified body structures 08/24/2020   Acute respiratory failure with hypoxia (HCC) 06/04/2020   Multifocal pneumonia 06/03/2020   Low back pain 12/12/2019   History of total knee replacement, 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   Lichen sclerosus et atrophicus 01/04/2015   LVH (left ventricular hypertrophy)    PAF (paroxysmal atrial fibrillation) (HCC) 07/06/2014   Thrombocytopenia (HCC) 07/06/2014   Hyperglycemia 07/06/2014   Morbid  obesity (HCC) 01/25/2013   Hypothyroidism 01/15/2013   History of total knee replacement, left 08/09/2007    Past Surgical History:  Procedure Laterality Date   BREAST EXCISIONAL BIOPSY  2010   left--was a vascular lesion   CARPAL TUNNEL RELEASE Left 08/09/2013   Procedure: LEFT CARPAL TUNNEL RELEASE;  Surgeon: Wyn Forster., MD;  Location: [facility];  Service: Orthopedics;  Laterality: Left;   CATARACT EXTRACTION     COLONOSCOPY  04/2003       DILATION AND CURETTAGE OF UTERUS     HYSTEROSCOPY WITH D & C  8/99   REPLACEMENT TOTAL KNEE Bilateral 12/2006  07/2007    TONSILLECTOMY     TRIGGER FINGER RELEASE Left 08/09/2013   Procedure: LEFT A1/A2 PULLEY RELEASE;  Surgeon: Wyn Forster., MD;  Location: [facility];  Service: Orthopedics;  Laterality: Left;   TUBAL LIGATION  5/77     OB History     Gravida  2   Para  2   Term  2   Preterm  0   AB  0   Living  2      SAB  0   IAB  0   Ectopic  0   Multiple  0   Live Births  2           Family History  Problem Relation Age of Onset   Hypertension Mother    Kidney disease Mother    Alzheimer's disease Father    Diabetes Brother    Hypertension Brother    Spina bifida Daughter     Social History   Tobacco Use   Smoking status: Never   Smokeless tobacco: Never  Vaping Use   Vaping Use: Never used  Substance Use Topics   Alcohol use: No   Drug use: No    Home Medications Prior to Admission medications   Medication Sig Start Date End Date Taking? Authorizing Provider  busPIRone (BUSPAR) 15 MG tablet Take 15 mg by mouth 2 (two) times daily. 02/24/21   [provider]  Cholecalciferol (VITAMIN D3) 5000 UNITS TABS Take 1 tablet by mouth daily.    [provider]  diltiazem (CARDIZEM CD) 180 MG 24 hr capsule TAKE 1 CAPSULE BY MOUTH EVERY DAY 08/31/20   Wendall Stade, MD  flecainide (TAMBOCOR) 150 MG tablet Take 2 tablets (300 mg total) by mouth  daily as needed (AFib episode). Patient not taking: Reported on 03/14/2021 05/08/17   Wendall Stade, MD  folic acid (FOLVITE) 1 MG tablet Take 1 tablet (1 mg total) by mouth daily. 03/17/21   Almon Hercules, MD  levothyroxine (SYNTHROID, LEVOTHROID) 112 MCG tablet Take 112 mcg by mouth daily before breakfast.    [provider]  loperamide (IMODIUM) 2 MG capsule Take 1 capsule (2 mg total) by mouth every 4 (four) hours as  needed for diarrhea or loose stools. 03/17/21   Almon Hercules, MD  sertraline (ZOLOFT) 50 MG tablet Take 50 mg by mouth daily. 02/21/21   [provider]    Allergies    Patient has no known allergies.  Review of Systems   Review of Systems 10 systems reviewed and negative except as per HPI Physical Exam Updated Vital Signs BP 132/68 (BP Location: Right Arm)   Pulse 67   Temp 97.8 F (36.6 C) (Oral)   Resp (!) 24   Ht 5\' 8"  (1.727 m)   Wt 134.7 kg   LMP 07/14/2001   SpO2 99%   BMI 45.16 kg/m   Physical Exam Constitutional:      Comments: Patient is alert, nontoxic no respiratory distress.  HENT:     Head: Normocephalic and atraumatic.     Mouth/Throat:     Mouth: Mucous membranes are moist.     Pharynx: Oropharynx is clear.  Eyes:     Extraocular Movements: Extraocular movements intact.     Pupils: Pupils are equal, round, and reactive to light.  Cardiovascular:     Rate and Rhythm: Normal rate and regular rhythm.  Pulmonary:     Effort: Pulmonary effort is normal.     Breath sounds: Normal breath sounds.  Abdominal:     General: There is no distension.     Palpations: Abdomen is soft.     Tenderness: There is no abdominal tenderness. There is no guarding.  Musculoskeletal:     Cervical back: Neck supple.     Comments: No deformity or pain of the upper extremities.  Patient has a significant lateral deformity of the left ankle.  Clinical appearance consistent with dislocation.  The dorsalis pedis pulse is 1-2+ and palpable.  The foot is  warm and dry.  Patient can move the toes without difficulty.  Right foot warm and dry with normal dorsalis pedis pulse 2+.  Skin:    General: Skin is warm and dry.  Neurological:     General: No focal deficit present.     Mental Status: She is oriented to person, place, and time.     Cranial Nerves: No cranial nerve deficit.     Motor: No weakness.     Coordination: Coordination normal.  Psychiatric:        Mood and Affect: Mood normal.    ED Results / Procedures / Treatments   Labs (all labs ordered are listed, but only abnormal results are displayed) Labs Reviewed - No data to display  EKG EKG Interpretation  Date/Time:  Sunday April 28 2021 15:46:30 EDT Ventricular Rate:  61 PR Interval:  195 QRS Duration: 91 QT Interval:  497 QTC Calculation: 501 R Axis:   -15 Text Interpretation: Sinus rhythm Low voltage, extremity leads Abnormal inferior Q waves Prolonged QT interval Confirmed by [id number] [phone]) on 04/29/2021 9:22:46 AM  Radiology No results found.  Procedures .Ortho Injury Treatment  Date/Time: 05/01/2021 5:45 PM Performed by: 05/03/2021, MD Authorized by: Arby Barrette, MD   Consent:    Consent obtained:  Verbal   Consent given by:  Patient   Risks discussed:  Fracture, nerve damage, restricted joint movement, vascular damage and stiffnessInjury location: ankle Location details: left ankle Injury type: fracture-dislocation Fracture type: trimalleolar Pre-procedure distal perfusion: normal Pre-procedure neurological function: normal Pre-procedure range of motion: reduced  Anesthesia: Local anesthesia used: no  Patient sedated: Yes. Refer to sedation procedure documentation for details of sedation. Manipulation performed:  yes Skin traction used: yes Skeletal traction used: yes Reduction successful: yes X-ray confirmed reduction: yes Immobilization: splint Splint type: short leg and ankle stirrup Splint Applied by: ED Provider  and Ortho Tech Supplies used: cotton padding, elastic bandage and Ortho-Glass Post-procedure distal perfusion: normal Post-procedure neurological function: normal Post-procedure range of motion: improved     Medications Ordered in ED Medications - No data to display  ED Course  I have reviewed the triage vital signs and the nursing notes.  Pertinent labs & imaging results that were available during my care of the patient were reviewed by me and considered in my medical decision making (see chart for details).  Clinical Course as of 05/01/21 1744  Wynelle Link Apr 28, 2021  4010 Consult: Reviewed with Dr. Shon Baton.  Patient has significant medical comorbidities.  Recommends admission to hospitalist service and Ortho will consult in the morning for surgical management.  Keep n.p.o. after midnight. [MP]    Clinical Course User Index [MP] Arby Barrette, MD   MDM Rules/Calculators/A&P                          Patient describes problems with generalized weakness and gait instability requiring use of a walker for additional support and preventing falls.  She has had some ongoing GI symptoms and persistent weakness but no preceding palpitation, chest pain, shortness of breath with today's episode.  As she was collapsing, her ankle rolled as well.  On exam she has a significantly displaced left ankle with x-ray confirmed dislocation trimalleolar fracture.  Patient's mental status is alert and clear.  She does not exhibit any signs of other acute injury from the episode.  Ankle reduced and splinted.  Patient tolerated this well.  Patient admitted to medical hospitalist service for ankle fracture with severe comorbid illness with Dr. Shon Baton consulted for orthopedics.  Final Clinical Impression(s) / ED Diagnoses Final diagnoses:  ankle fracture  General weakness  Closed trimalleolar fracture of left ankle, initial encounter  Severe comorbid illness    Rx / DC Orders ED Discharge Orders     None         Arby Barrette, MD 05/01/21 1750

## 2021-04-28 NOTE — Progress Notes (Signed)
Orthopedic Tech Progress Note Patient Details:  Sally Wise May 31, 1946 [id number]  Ortho Devices Type of Ortho Device: Ace wrap, Stirrup splint, Short leg splint Ortho Device/Splint Location: left reduction Ortho Device/Splint Interventions: Application   Post Interventions Patient Tolerated: Well Instructions Provided: Care of device  Saul Fordyce 04/28/2021, 6:20 PM

## 2021-04-28 NOTE — H&P (Addendum)
Sally Wise TML:[id number] DOB: [birth year] DOA: 04/28/2021     PCP: [name], MD   [facility]  CARDS:  Dr. Johnsie Cancel GI Dr. Cristina Gong [facility]ie [facility], [facility]) Urology Dr. Joelyn Oms  Patient arrived to ER on 04/28/21 at 1459 Referred by Attending Charlesetta Shanks, MD   Patient coming from: home Lives  With family    Chief Complaint:   Chief Complaint  Patient presents with   Ankle Pain   Fall    HPI: Sally Wise is a 75 y.o. female with medical history significant of a.fib, synthroid, depression, HLD, HTN, obesity, chronic diastolic CHF  Presented with  deformity to left ankle. Walks with a walker.  She was going to the bathroom and was feeling too weak and still went down No LOC, no headache or confusion Received 100 mcg of Fentanyl Reports early satiety mucus like stools   She has some bloating eating makes her want to go to the bathroom she feels like her esophagus is closing off sometimes  In September pt did test positive for EPEC was reportedly given Zpack for it Her symptoms never resolved    Has   been vaccinated against COVID  and boosted   Initial COVID TEST  in house  PCR testing  Pending  Lab Results  Component Value Date   [place] 03/14/2021   [place] NEGATIVE 06/03/2020     Regarding pertinent Chronic problems:       HTN on diltiazem   chronic CHF diastolic/systolic/ combined - last echo November 6812 Grade I diastolic     Hypothyroidism:  Lab Results  Component Value Date   TSH 0.94 07/04/2014   on synthroid    Morbid obesity-   BMI Readings from Last 1 Encounters:  04/28/21 45.16 kg/m      A. Fib -  - CHA2DS2 vas score  4  not on anticoagulation - pt states she does not need it because it is not a real problem anymore        -  Rate control:  Currently controlled with  Diltiazem          - Rhythm control:  flecainide      Chronic anemia - baseline hg Hemoglobin & Hematocrit  Recent Labs     03/16/21 0715 03/17/21 0618 04/28/21 1540  HGB 9.6* 9.5* 9.3*    While in ER:  Interval reduction of the dislocation. Improved alignment of the trimalleolar fracture.   Trimalleolar fracture dislocation.   ED Triage Vitals  Enc Vitals Group     BP 04/28/21 1516 132/68     Pulse Rate 04/28/21 1516 67     Resp 04/28/21 1516 (!) 24     Temp 04/28/21 1516 97.8 F (36.6 C)     Temp Source 04/28/21 1516 Oral     SpO2 04/28/21 1513 99 %     Weight 04/28/21 1518 297 lb (134.7 kg)     Height 04/28/21 1518 5' 8" (1.727 m)     Head Circumference --      Peak Flow --      Pain Score 04/28/21 1516 5     Pain Loc --      Pain Edu? --      Excl. in [place]? --   TMAX(24)@     _________________________________________ Significant initial  Findings: Abnormal Labs Reviewed  COMPREHENSIVE METABOLIC PANEL - Abnormal; Notable for the following components:      Result Value   Glucose, Bld 113 (*)  Creatinine, Ser 1.36 (*)    Calcium 8.1 (*)    Total Protein 6.4 (*)    Albumin 2.9 (*)    AST 11 (*)    Alkaline Phosphatase 269 (*)    GFR, Estimated 40 (*)    All other components within normal limits  CBC WITH DIFFERENTIAL/PLATELET - Abnormal; Notable for the following components:   RBC 3.18 (*)    Hemoglobin 9.3 (*)    HCT 30.0 (*)    RDW 19.9 (*)    Platelets 101 (*)    All other components within normal limits  PROTIME-INR - Abnormal; Notable for the following components:   Prothrombin Time 18.0 (*)    INR 1.5 (*)    All other components within normal limits   ____________________________________________ Ordered  Left ankle Trimalleolar fracture dislocation.  CXR -  NON acute cardiomegaly   ECG: Ordered Personally reviewed by me showing: HR : 61 Rhythm:  NSR,   no evidence of ischemic changes QTC 501   The recent clinical data is shown below. Vitals:   04/28/21 1830 04/28/21 1845 04/28/21 1900 04/28/21 1915  BP: 136/77 127/70 129/61 130/73  Pulse: (!) 58 (!) 59 62 (!)  58  Resp: _0 Temp:      TempSrc:      SpO2: 100% 100% 100% 99%  Weight:      Height:         WBC     Component Value Date/Time   WBC 6.1 04/28/2021 1540   LYMPHSABS 1.2 04/28/2021 1540   LYMPHSABS 1.6 08/29/2014 1049   MONOABS 1.0 04/28/2021 1540   MONOABS 0.9 08/29/2014 1049   EOSABS 0.3 04/28/2021 1540   EOSABS 0.0 08/29/2014 1049   BASOSABS 0.0 04/28/2021 1540   BASOSABS 0.0 08/29/2014 1049       UA  ordered      Gastrointestinal Panel by PCR , Stool     Status: Abnormal   Collection Time: 03/15/21  1:24 PM   Specimen: Stool  Result Value Ref Range Status   Campylobacter species NOT DETECTED NOT DETECTED Final   Plesimonas shigelloides NOT DETECTED NOT DETECTED Final   Salmonella species NOT DETECTED NOT DETECTED Final   Yersinia enterocolitica NOT DETECTED NOT DETECTED Final   Vibrio species NOT DETECTED NOT DETECTED Final   Vibrio cholerae NOT DETECTED NOT DETECTED Final   Enteroaggregative E coli (EAEC) NOT DETECTED NOT DETECTED Final   Enteropathogenic E coli (EPEC) DETECTED (A) NOT DETECTED Final    Comment: RESULT CALLED TO, READ BACK BY AND VERIFIED WITH: CINDY HUGHEY 03/16/21 1645 AMK    Enterotoxigenic E coli (ETEC) NOT DETECTED NOT DETECTED Final   Shiga like toxin producing E coli (STEC) NOT DETECTED NOT DETECTED Final   Shigella/Enteroinvasive E coli (EIEC) NOT DETECTED NOT DETECTED Final   Cryptosporidium NOT DETECTED NOT DETECTED Final   Cyclospora cayetanensis NOT DETECTED NOT DETECTED Final   Entamoeba histolytica NOT DETECTED NOT DETECTED Final   Giardia lamblia NOT DETECTED NOT DETECTED Final   Adenovirus F40/41 NOT DETECTED NOT DETECTED Final   Astrovirus NOT DETECTED NOT DETECTED Final   Norovirus GI/GII NOT DETECTED NOT DETECTED Final   Rotavirus A NOT DETECTED NOT DETECTED Final   Sapovirus (I, II, IV, and V) NOT DETECTED NOT DETECTED Final    Comment: Performed at [facility], [address] [place], [place] [address]     _______________________________________________________ ER Provider Called:    Orthopedics Dr. Rolena Infante They Recommend  admit to medicine , pain control, Npo post midnight will see in AM and decide if needs surgical intervention Will see in AM   _______________________________________________ Hospitalist was called for admission for left ankle fracture  The following Work up has been ordered so far:  Orders Placed This Encounter  Procedures   Sedation procedure   Sedation procedure   Resp Panel by RT-PCR (Flu A&B, Covid) Nasopharyngeal Swab   DG Ankle Complete Left   DG Chest Port 1 View   DG Ankle Left Port   Comprehensive metabolic panel   Lipase, blood   CBC with Differential   Protime-INR   Urinalysis, Routine w reflex microscopic   Diet NPO time specified   Diet NPO time specified   Apply ice to affected area (if injury is <48 hours old)   Immobilize affected extremity   Remove jewelry   Procedural sedation   Consult to orthopedic surgery   Consult to hospitalist   ED EKG     Following Medications were ordered in ER: Medications  0.9 %  sodium chloride infusion ( Intravenous New Bag/Given 04/28/21 1757)  propofol (DIPRIVAN) 10 mg/mL bolus/IV push 67.4 mg (40 mg Intravenous Given 04/28/21 1759)  HYDROmorphone (DILAUDID) injection 0.5 mg (0.5 mg Intravenous Given 04/28/21 1657)  ondansetron (ZOFRAN) injection 4 mg (4 mg Intravenous Given 04/28/21 1657)  propofol (DIPRIVAN) 10 mg/mL bolus/IV push (20 mg Intravenous Given 04/28/21 1801)        Consult Orders  (From admission, onward)           Start     Ordered   04/28/21 1920  Consult to hospitalist  Once       Provider:  (Not yet assigned)  Question Answer Comment  Place call to: [facility]   Reason for Consult Admit      04/28/21 1920            OTHER Significant initial  Findings:  labs showing:    Recent Labs  Lab 04/28/21 1540  NA 135  K 4.3  CO2 24  GLUCOSE 113*  BUN 21   CREATININE 1.36*  CALCIUM 8.1*    Cr  Up from baseline see below Lab Results  Component Value Date   CREATININE 1.36 (H) 04/28/2021   CREATININE 1.00 03/17/2021   CREATININE 0.97 03/16/2021    Recent Labs  Lab 04/28/21 1540  AST 11*  ALT 11  ALKPHOS 269*  BILITOT 0.8  PROT 6.4*  ALBUMIN 2.9*   Lab Results  Component Value Date   CALCIUM 8.1 (L) 04/28/2021   PHOS 3.8 03/17/2021    Plt: Lab Results  Component Value Date   PLT 101 (L) 04/28/2021        Recent Labs  Lab 04/28/21 1540  WBC 6.1  NEUTROABS 3.5  HGB 9.3*  HCT 30.0*  MCV 94.3  PLT 101*    HG/HCT  stable,      Component Value Date/Time   HGB 9.3 (L) 04/28/2021 1540   HGB 10.6 (L) 12/07/2020 1547   HGB 12.5 08/29/2014 1049   HCT 30.0 (L) 04/28/2021 1540   HCT 40.2 08/29/2014 1049   MCV 94.3 04/28/2021 1540   MCV 87.3 08/29/2014 1049     Recent Labs  Lab 04/28/21 1540  LIPASE 22   No results for input(s): AMMONIA in the last 168 hours.        Cultures:    Component Value Date/Time   SDES  06/05/2020 1800    URINE, CLEAN  CATCH Performed at [facility], [place] [address] [place], [place] [address]    SPECREQUEST  06/05/2020 1800    NONE Performed at [facility], [place] [address] [place], [place] [address]    CULT  06/05/2020 1800    NO GROWTH Performed at [facility], [place] [address], [place], [place] [address]    REPTSTATUS 06/07/2020 FINAL 06/05/2020 1800     Radiological Exams on Admission: DG Ankle Complete Left  Result Date: 04/28/2021 CLINICAL DATA:  Fall, injury, deformity EXAM: LEFT ANKLE COMPLETE - 3+ VIEW COMPARISON:  None. FINDINGS: There is fracture dislocation at the left ankle. Fracture through the medial malleolus and distal fibula. The talus is dislocated laterally relative to the tibia. Probable posterior malleolar fracture as well. IMPRESSION: Trimalleolar fracture dislocation. Electronically Signed   By: Rolm Baptise M.D.   On: 04/28/2021 16:05   DG Chest Port 1 View  Result Date: 04/28/2021 CLINICAL DATA:  Fall EXAM: PORTABLE CHEST 1 VIEW COMPARISON:  06/05/2020 FINDINGS: Cardiomegaly. Lungs clear. No effusions or edema. No acute bony abnormality. IMPRESSION: Cardiomegaly.  No active disease. Electronically Signed   By: Rolm Baptise M.D.   On: 04/28/2021 16:05   DG Ankle Left Port  Result Date: 04/28/2021 CLINICAL DATA:  Postreduction EXAM: PORTABLE LEFT ANKLE - 2 VIEW COMPARISON:  04/28/2021 FINDINGS: Interval reduction of the previously seen dislocated left ankle. Improved alignment of the medial malleolar and lateral malleolar fractures with continued mild displacement. Small fracture fragment at the posterior corner of the tibia on the lateral view. IMPRESSION: Interval reduction of the dislocation. Improved alignment of the trimalleolar fracture. Electronically Signed   By: Rolm Baptise M.D.   On: 04/28/2021 19:16   _______________________________________________________________________________________________________ Latest  Blood pressure 130/73, pulse (!) 58, temperature 97.8 F (36.6 C), temperature source Oral, resp. rate 15, height 5' 8" (1.727 m), weight 134.7 kg, last menstrual period 07/14/2001, SpO2 99 %.   Review of Systems:    Pertinent positives include:   fatigue, falls, early satiety  Constitutional:  No weight loss, night sweats, Fevers, chills, weight loss  HEENT:  No headaches, Difficulty swallowing,Tooth/dental problems,Sore throat,  No sneezing, itching, ear ache, nasal congestion, post nasal drip,  Cardio-vascular:  No chest pain, Orthopnea, PND, anasarca, dizziness, palpitations.no Bilateral lower extremity swelling  GI:  No heartburn, indigestion, abdominal pain, nausea, vomiting, diarrhea, change in bowel habits, loss of appetite, melena, blood in stool, hematemesis Resp:  no shortness of breath at rest. No dyspnea on exertion, No excess mucus, no productive cough,  No non-productive cough, No coughing up of blood.No change in color of mucus.No wheezing. Skin:  no rash or lesions. No jaundice GU:  no dysuria, change in color of urine, no urgency or frequency. No straining to urinate.  No flank pain.  Musculoskeletal:  No joint pain or no joint swelling. No decreased range of motion. No back pain.  Psych:  No change in mood or affect. No depression or anxiety. No memory loss.  Neuro: no localizing neurological complaints, no tingling, no weakness, no double vision, no gait abnormality, no slurred speech, no confusion  All systems reviewed and apart from [place] all are negative _______________________________________________________________________________________________ Past Medical History:   Past Medical History:  Diagnosis Date   Hematuria    evaluation with Dr. Joelyn Oms   Hypothyroidism 06/2010   Kidney stone 25/4982   Lichen sclerosus et atrophicus 2/13   biopsy proven   LVH (left ventricular hypertrophy)  Morbid obesity ([place])    PAF (paroxysmal atrial fibrillation) ([place])    a. Remote hx, re-established care 01/2013 with Dr. Johnsie Cancel for recurrence. 2D echo 01/2013: mod LVH, EF 55-65%, no RWMA, grade 1 d/d. b. 06/2014: started on Xarelto.    Thrombocytopenia ([place])    Vitamin D deficiency 06/2010   Wears glasses     Past Surgical History:  Procedure Laterality Date   BREAST EXCISIONAL BIOPSY  2010   left--was a vascular lesion   CARPAL TUNNEL RELEASE Left 08/09/2013   Procedure: LEFT CARPAL TUNNEL RELEASE;  Surgeon: Cammie Sickle., MD;  Location: [facility];  Service: Orthopedics;  Laterality: Left;   CATARACT EXTRACTION     COLONOSCOPY  04/2003       DILATION AND CURETTAGE OF UTERUS     HYSTEROSCOPY WITH D & C  8/99   REPLACEMENT TOTAL KNEE Bilateral 12/2006  07/2007    TONSILLECTOMY     TRIGGER FINGER RELEASE Left 08/09/2013   Procedure: LEFT A1/A2 PULLEY RELEASE;  Surgeon: Cammie Sickle., MD;  Location: [facility];  Service: Orthopedics;  Laterality: Left;   TUBAL LIGATION  5/77    Social History:  Ambulatory  walker        reports that she has never smoked. She has never used smokeless tobacco. She reports that she does not drink alcohol and does not use drugs.   Family History:   Family History  Problem Relation Age of Onset   Hypertension Mother    Kidney disease Mother    Alzheimer's disease Father    Diabetes Brother    Hypertension Brother    Spina bifida Daughter    ______________________________________________________________________________________________ Allergies: No Known Allergies   Prior to Admission medications   Medication Sig Start Date End Date Taking? Authorizing Provider  busPIRone (BUSPAR) 15 MG tablet Take 15 mg by mouth 2 (two) times daily. 02/24/21   [provider]  Cholecalciferol (VITAMIN D3) 5000 UNITS TABS Take 1 tablet by mouth daily.    [provider]  diltiazem (CARDIZEM CD) 180 MG 24 hr capsule TAKE 1 CAPSULE BY MOUTH EVERY DAY 08/31/20   Josue Hector, MD  flecainide (TAMBOCOR) 150 MG tablet Take 2 tablets (300 mg total) by mouth daily as needed (AFib episode). Patient not taking: Reported on 03/14/2021 05/08/17   Josue Hector, MD  folic acid (FOLVITE) 1 MG tablet Take 1 tablet (1 mg total) by mouth daily. 03/17/21   Mercy Riding, MD  levothyroxine (SYNTHROID, LEVOTHROID) 112 MCG tablet Take 112 mcg by mouth daily before breakfast.    [provider]  loperamide (IMODIUM) 2 MG capsule Take 1 capsule (2 mg total) by mouth every 4 (four) hours as needed for diarrhea or loose stools. 03/17/21   Mercy Riding, MD  sertraline (ZOLOFT) 50 MG tablet Take 50 mg by mouth daily. 02/21/21   [provider]    ___________________________________________________________________________________________________ Physical Exam: Vitals with BMI 04/28/2021 04/28/2021 04/28/2021  Height - - -  Weight - - -  BMI -  - -  Systolic 403 474 259  Diastolic 73 61 70  Pulse 58 62 59     1. General:  in No  Acute distress  Chronically ill  -appearing 2. Psychological: Alert and  Oriented 3. Head/ENT:    Dry Mucous Membranes  Head Non traumatic, neck supple                          Poor Dentition 4. SKIN:  decreased Skin turgor,  Skin clean Dry and intact no rash 5. Heart: Regular rate and rhythm no  Murmur, no Rub or gallop 6. Lungs: y, no wheezes or crackles   7. Abdomen: Soft,  non-tender, Non distended   obese  bowel sounds present 8. Lower extremities: no clubbing, cyanosis, no  edema 9. Neurologically Grossly intact, moving all 4 extremities equally   10. MSK: Normal range of motion, limited in left ankle due to pain      Chart has been reviewed  ______________________________________________________________________________________________  Assessment/Plan 75 y.o. female with medical history significant of a.fib, synthroid, depression, HLD, HTN, obesity, chronic diastolic CHF  Admitted for  Left ankle fracture and AKI  Present on Admission:  Ankle fracture - appreciate orthopedics consult, NPO post midnight in case need surgical involvement Likely will need rehab    PAF (paroxysmal atrial fibrillation) ([place]) - continue diltiazem Pt not on anticoagulation   Mucoid diarrhea - chronic, send gastric study appreciate Gi consult   Morbid obesity ([place]) -will need nutrtional consult in the future,    Hypothyroidism - - Check TSH continue home medications at current dose   Chronic diastolic CHF (congestive heart failure) (H[facility]) - stable avoid fluid overload    Thrombocytopenia ([facility]) - chronic stable, no hx of liver disease will need further work up as outpt  Hx of hepatic cyst - will need further eval can be done as out pt  Slightly elevated INR pt not on anticoagulation, together with thrombocytopenia, hypoalbumenimia Would help to eval for any possible liver  disease, vs vitamin K deficiency due to malabsorption  RUQ to eval liver parenchyma     Anemia - chronic obtain anemia panel  Debility - will need PT OT assessment  Elevated alk phosph - obtain GGT to see if it is liver or bone disease related  Elevated QT - - will monitor on tele avoid QT prolonging medications, rehydrate correct electrolytes   Other plan as per orders.  DVT prophylaxis:  SCD      Code Status:    Code Status: Prior FULL CODE   as per patient  I had personally discussed CODE STATUS with patient         Family Communication:   Family not at  Bedside    Disposition Plan:     likely will need placement for rehabilitation                          Following barriers for discharge:                            Electrolytes corrected                                                             Pain controlled with PO medications  Will need to be able to tolerate PO                                                        Will need consultants to evaluate patient prior to discharge                       Would benefit from PT/OT eval prior to DC   please order when stable                    Transition of care consulted                Consults called: orthopedics and GI are aware  Admission status:  ED Disposition     ED Disposition  Admit   Condition  --   Comment  The patient appears reasonably stabilized for admission considering the current resources, flow, and capabilities available in the ED at this time, and I doubt any other [facility] requiring further screening and/or treatment in the ED prior to admission is  present.            inpatient     I Expect 2 midnight stay secondary to severity of patient's current illness need for inpatient interventions justified by the following:   Severe lab/radiological/exam abnormalities including:  ankle fracture and AKI   and extensive comorbidities  including:   CHF   Morbid Obesity    That are currently affecting medical management.   I expect  patient to be hospitalized for 2 midnights requiring inpatient medical care.  Patient is at high risk for adverse outcome (such as loss of life or disability) if not treated.  Indication for inpatient stay as follows:   severe pain requiring acute inpatient management,    Need for operative/procedural  intervention    Need for  , IV fluids, IV pain medications,      Level of care    tele  For 12H        Precautions: admitted as  asymptomatic screening protocol    PPE: Used by the provider:   N95  eye Goggles,  Gloves       04/28/2021, 8:42 PM    [facility]     after 2 AM please page floor coverage PA If 7AM-7PM, please contact the day team taking care of the patient using [email or web address]   Patient was evaluated in the context of the global COVID-19 pandemic, which necessitated consideration that the patient might be at risk for infection with the SARS-CoV-2 virus that causes COVID-19. Institutional protocols and algorithms that pertain to the evaluation of patients at risk for COVID-19 are in a state of rapid change based on information released by regulatory bodies including the CDC and federal and state organizations. These policies and algorithms were followed during the patient's care.

## 2021-04-29 ENCOUNTER — Encounter: Payer: Medicare Other | Admitting: Physical Therapy

## 2021-04-29 ENCOUNTER — Encounter (HOSPITAL_COMMUNITY): Payer: Self-pay | Admitting: Internal Medicine

## 2021-04-29 ENCOUNTER — Inpatient Hospital Stay (HOSPITAL_COMMUNITY): Payer: Medicare Other

## 2021-04-29 DIAGNOSIS — R9431 Abnormal electrocardiogram [ECG] [EKG]: Secondary | ICD-10-CM

## 2021-04-29 DIAGNOSIS — I480 Paroxysmal atrial fibrillation: Secondary | ICD-10-CM | POA: Diagnosis not present

## 2021-04-29 DIAGNOSIS — E039 Hypothyroidism, unspecified: Secondary | ICD-10-CM | POA: Diagnosis not present

## 2021-04-29 DIAGNOSIS — I5032 Chronic diastolic (congestive) heart failure: Secondary | ICD-10-CM | POA: Diagnosis not present

## 2021-04-29 DIAGNOSIS — S82892A Other fracture of left lower leg, initial encounter for closed fracture: Secondary | ICD-10-CM | POA: Diagnosis not present

## 2021-04-29 LAB — BASIC METABOLIC PANEL
Anion gap: 7 (ref 5–15)
BUN: 26 mg/dL — ABNORMAL HIGH (ref 8–23)
CO2: 23 mmol/L (ref 22–32)
Calcium: 7.9 mg/dL — ABNORMAL LOW (ref 8.9–10.3)
Chloride: 106 mmol/L (ref 98–111)
Creatinine, Ser: 1.56 mg/dL — ABNORMAL HIGH (ref 0.44–1.00)
GFR, Estimated: 34 mL/min — ABNORMAL LOW (ref >60)
Glucose, Bld: 86 mg/dL (ref 70–99)
Potassium: 3.7 mmol/L (ref 3.5–5.1)
Sodium: 136 mmol/L (ref 135–145)

## 2021-04-29 LAB — CBC
HCT: 24.7 % — ABNORMAL LOW (ref 36.0–46.0)
Hemoglobin: 7.7 g/dL — ABNORMAL LOW (ref 12.0–15.0)
MCH: 29.6 pg (ref 26.0–34.0)
MCHC: 31.2 g/dL (ref 30.0–36.0)
MCV: 95 fL (ref 80.0–100.0)
Platelets: 85 10*3/uL — ABNORMAL LOW (ref 150–400)
RBC: 2.6 MIL/uL — ABNORMAL LOW (ref 3.87–5.11)
RDW: 19.7 % — ABNORMAL HIGH (ref 11.5–15.5)
WBC: 5.4 10*3/uL (ref 4.0–10.5)
nRBC: 0 % (ref 0.0–0.2)

## 2021-04-29 LAB — TSH: TSH: 3.944 u[IU]/mL (ref 0.350–4.500)

## 2021-04-29 LAB — FOLATE: Folate: 19.1 ng/mL (ref >5.9)

## 2021-04-29 LAB — RETICULOCYTES
Immature Retic Fract: 26.8 % — ABNORMAL HIGH (ref 2.3–15.9)
RBC.: 2.57 MIL/uL — ABNORMAL LOW (ref 3.87–5.11)
Retic Count, Absolute: 45.5 10*3/uL (ref 19.0–186.0)
Retic Ct Pct: 1.8 % (ref 0.4–3.1)

## 2021-04-29 LAB — FERRITIN: Ferritin: 229 ng/mL (ref 11–307)

## 2021-04-29 LAB — VITAMIN D 25 HYDROXY (VIT D DEFICIENCY, FRACTURES): Vit D, 25-Hydroxy: 69.59 ng/mL (ref 30–100)

## 2021-04-29 LAB — IRON AND TIBC
Iron: 28 ug/dL (ref 28–170)
Saturation Ratios: 21 % (ref 10.4–31.8)
TIBC: 131 ug/dL — ABNORMAL LOW (ref 250–450)
UIBC: 103 ug/dL

## 2021-04-29 LAB — VITAMIN B12: Vitamin B-12: 3928 pg/mL — ABNORMAL HIGH (ref 180–914)

## 2021-04-29 MED ORDER — INFLUENZA VAC A&B SA ADJ QUAD 0.5 ML IM PRSY
0.5000 mL | PREFILLED_SYRINGE | INTRAMUSCULAR | Status: AC
  Administered 2021-05-13: 0.5 mL via INTRAMUSCULAR
  Filled 2021-04-29 (×2): qty 0.5

## 2021-04-29 MED ORDER — SODIUM CHLORIDE 0.9 % IV BOLUS
500.0000 mL | Freq: Once | INTRAVENOUS | Status: AC
  Administered 2021-04-29: 500 mL via INTRAVENOUS

## 2021-04-29 NOTE — Consult Note (Signed)
Referring Provider: [facility] Primary Care Physician:  [name], MD Primary Gastroenterologist:  Dr. Cristina Gong [facility])  Reason for Consultation:  Mucus in stool  HPI: Sally Wise is a 75 y.o. female hx of A fib, chronic diastolic CHF, obesity, HLD, HTN, presents for mucus in her stool.  Originally presented to ED with ankle pain after a fall. Found to have a fracture. Denied LOC.  Hx of EPEC 03/2021 treated with azithromycin. Patient states she has been dealing with mucus discharge from her rectum ongoing for the last few months. It is very bothersome to her. She was under the assumption azithromycin was supposed to fix it. She has been on Align probiotic for a few weeks now. Denies melena/hematochezia. Denies diarrhea/constipation. Denies unintentional weight loss. Denies family history of colon cancer.  Flex sigmoidoscopy 03/2021 done for mucus discharge from her rectum: showed severe diverticulosis with negative random rectal biopsies.  Colonoscopy in 2015 showed diverticulosis, 3 diminutive polyps (1 tubular adenoma).   Past Medical History:  Diagnosis Date   Hematuria    evaluation with Dr. Joelyn Oms   Hypothyroidism 06/2010   Kidney stone 34/1937   Lichen sclerosus et atrophicus 2/13   biopsy proven   LVH (left ventricular hypertrophy)    Morbid obesity (HCC)    PAF (paroxysmal atrial fibrillation) ([place])    a. Remote hx, re-established care 01/2013 with Dr. Johnsie Cancel for recurrence. 2D echo 01/2013: mod LVH, EF 55-65%, no RWMA, grade 1 d/d. b. 06/2014: started on Xarelto.    Thrombocytopenia ([place])    Vitamin D deficiency 06/2010   Wears glasses     Past Surgical History:  Procedure Laterality Date   BREAST EXCISIONAL BIOPSY  2010   left--was a vascular lesion   CARPAL TUNNEL RELEASE Left 08/09/2013   Procedure: LEFT CARPAL TUNNEL RELEASE;  Surgeon: Cammie Sickle., MD;  Location: [place];  Service: Orthopedics;  Laterality: Left;   CATARACT EXTRACTION      COLONOSCOPY  04/2003       DILATION AND CURETTAGE OF UTERUS     HYSTEROSCOPY WITH D & C  8/99   REPLACEMENT TOTAL KNEE Bilateral 12/2006  07/2007    TONSILLECTOMY     TRIGGER FINGER RELEASE Left 08/09/2013   Procedure: LEFT A1/A2 PULLEY RELEASE;  Surgeon: Cammie Sickle., MD;  Location: [place];  Service: Orthopedics;  Laterality: Left;   TUBAL LIGATION  5/77    Prior to Admission medications   Medication Sig Start Date End Date Taking? Authorizing Provider  busPIRone (BUSPAR) 15 MG tablet Take 15 mg by mouth in the morning and at bedtime. 02/24/21  Yes [provider]  Cholecalciferol (VITAMIN D3) 5000 UNITS TABS Take 1 tablet by mouth daily.   Yes [provider]  diltiazem (CARDIZEM CD) 180 MG 24 hr capsule TAKE 1 CAPSULE BY MOUTH EVERY DAY Patient taking differently: Take 180 mg by mouth in the morning. 08/31/20  Yes Josue Hector, MD  EUTHYROX 112 MCG tablet Take 112 mcg by mouth daily before breakfast.   Yes [provider]  folic acid (FOLVITE) 1 MG tablet Take 1 tablet (1 mg total) by mouth daily. 03/17/21  Yes Mercy Riding, MD  loperamide (IMODIUM) 2 MG capsule Take 1 capsule (2 mg total) by mouth every 4 (four) hours as needed for diarrhea or loose stools. 03/17/21  Yes Mercy Riding, MD  sertraline (ZOLOFT) 50 MG tablet Take 50 mg by mouth in the  morning. 02/21/21  Yes [provider]  flecainide (TAMBOCOR) 150 MG tablet Take 2 tablets (300 mg total) by mouth daily as needed (AFib episode). Patient not taking: No sig reported 05/08/17   Josue Hector, MD    Scheduled Meds:  busPIRone  15 mg Oral BID   diltiazem  180 mg Oral Daily   [START ON 04/30/2021] influenza vaccine adjuvanted  0.5 mL Intramuscular Tomorrow-1000   levothyroxine  112 mcg Oral Q0600   sertraline  50 mg Oral Daily   Continuous Infusions:  sodium chloride 75 mL/hr at 04/29/21 0434   methocarbamol (ROBAXIN) IV     PRN  Meds:.HYDROcodone-acetaminophen, HYDROmorphone (DILAUDID) injection, methocarbamol **OR** methocarbamol (ROBAXIN) IV  Allergies as of 04/28/2021   (No Known Allergies)    Family History  Problem Relation Age of Onset   Hypertension Mother    Kidney disease Mother    Alzheimer's disease Father    Diabetes Brother    Hypertension Brother    Spina bifida Daughter     Social History   Socioeconomic History   Marital status: Married    Spouse name: Not on file   Number of children: Not on file   Years of education: Not on file   Highest education level: Not on file  Occupational History   Not on file  Tobacco Use   Smoking status: Never   Smokeless tobacco: Never  Vaping Use   Vaping Use: Never used  Substance and Sexual Activity   Alcohol use: No   Drug use: No   Sexual activity: Yes    Partners: Male    Birth control/protection: Surgical  Other Topics Concern   Not on file  Social History Narrative   Not on file   Social Determinants of Health   Financial Resource Strain: Not on file  Food Insecurity: Not on file  Transportation Needs: Not on file  Physical Activity: Not on file  Stress: Not on file  Social Connections: Not on file  Intimate Partner Violence: Not on file    Review of Systems: Review of Systems  Constitutional:  Negative for chills and fever.  HENT:  Negative for sinus pain and sore throat.   Eyes:  Negative for pain and redness.  Respiratory:  Negative for cough and wheezing.   Cardiovascular:  Negative for chest pain and palpitations.  Gastrointestinal:  Negative for abdominal pain, blood in stool, constipation, diarrhea, heartburn, melena, nausea and vomiting.  Genitourinary:  Negative for dysuria and urgency.  Musculoskeletal:  Positive for falls and joint pain.  Skin:  Negative for itching and rash.  Neurological:  Negative for seizures and loss of consciousness.  Psychiatric/Behavioral:  Negative for substance abuse. The patient does  not have insomnia.     Physical Exam:Physical Exam Constitutional:      General: She is not in acute distress.    Appearance: She is obese. She is not ill-appearing.  HENT:     Head: Normocephalic and atraumatic.     Nose: Nose normal. No congestion.     Mouth/Throat:     Mouth: Mucous membranes are moist.     Pharynx: Oropharynx is clear.  Eyes:     Extraocular Movements: Extraocular movements intact.     Conjunctiva/sclera: Conjunctivae normal.  Cardiovascular:     Rate and Rhythm: Normal rate and regular rhythm.  Pulmonary:     Effort: Pulmonary effort is normal. No respiratory distress.  Abdominal:     General: Abdomen is flat. Bowel sounds are  normal. There is no distension.     Palpations: Abdomen is soft. There is no mass.     Tenderness: There is no abdominal tenderness. There is no guarding or rebound.     Hernia: No hernia is present.  Musculoskeletal:        General: No swelling. Normal range of motion.     Cervical back: Normal range of motion and neck supple.  Skin:    General: Skin is warm and dry.  Neurological:     General: No focal deficit present.     Mental Status: She is alert and oriented to person, place, and time.  Psychiatric:        Mood and Affect: Mood normal.        Behavior: Behavior normal.        Thought Content: Thought content normal.        Judgment: Judgment normal.    Vital signs: Vitals:   04/29/21 0311 04/29/21 0448  BP: 100/81 (!) 108/48  Pulse: (!) 52 (!) 52  Resp:  16  Temp:  97.7 F (36.5 C)  SpO2: 96% 97%   Last BM Date: 04/28/21    GI:  Lab Results: Recent Labs    04/28/21 1540 04/29/21 0518  WBC 6.1 5.4  HGB 9.3* 7.7*  HCT 30.0* 24.7*  PLT 101* 85*   BMET Recent Labs    04/28/21 1540 04/29/21 0518  NA 135 136  K 4.3 3.7  CL 105 106  CO2 24 23  GLUCOSE 113* 86  BUN 21 26*  CREATININE 1.36* 1.56*  CALCIUM 8.1* 7.9*   LFT Recent Labs    04/28/21 1540  PROT 6.4*  ALBUMIN 2.9*  AST 11*  ALT 11   ALKPHOS 269*  BILITOT 0.8   PT/INR Recent Labs    04/28/21 1540  LABPROT 18.0*  INR 1.5*     Studies/Results: DG Ankle Complete Left  Result Date: 04/28/2021 CLINICAL DATA:  Fall, injury, deformity EXAM: LEFT ANKLE COMPLETE - 3+ VIEW COMPARISON:  None. FINDINGS: There is fracture dislocation at the left ankle. Fracture through the medial malleolus and distal fibula. The talus is dislocated laterally relative to the tibia. Probable posterior malleolar fracture as well. IMPRESSION: Trimalleolar fracture dislocation. Electronically Signed   By: Rolm Baptise M.D.   On: 04/28/2021 16:05   US Abdomen Complete  Result Date: 04/28/2021 CLINICAL DATA:  Elevated alk-phos, AK I. EXAM: ABDOMEN ULTRASOUND COMPLETE COMPARISON:  CT March 15, 2021 FINDINGS: Gallbladder: Cholelithiasis measuring up to 2.1 cm. Gallbladder wall cholesterolosis. No gallbladder wall thickening visualized. No sonographic Murphy sign noted by sonographer. Common bile duct: Diameter: 5 mm Liver: No focal lesion identified. Diffusely increased parenchymal echogenicity. Portal vein is patent on color Doppler imaging with normal direction of blood flow towards the liver. IVC: No abnormality visualized. Pancreas: Visualized portion unremarkable. Spleen: Size and appearance within normal limits. Right Kidney: Length: 10.1 cm. Cortical thinning. No mass or hydronephrosis visualized. Left Kidney: Length: 9.4 cm. Cortical thinning. No mass or hydronephrosis visualized. Abdominal aorta: No aneurysm visualized. Other findings: None. IMPRESSION: 1. Cholelithiasis without sonographic evidence of acute cholecystitis. 2. The echogenicity of the liver is increased. This is a nonspecific finding but is most commonly seen with fatty infiltration of the liver. There are no obvious focal liver lesions. 3. Bilateral renal cortical thinning as can be seen with medical renal disease. 4. Small volume perihepatic and right perinephric free fluid.  Electronically Signed   By: Andree Moro.D.  On: 04/28/2021 23:25   DG Chest Port 1 View  Result Date: 04/28/2021 CLINICAL DATA:  Fall EXAM: PORTABLE CHEST 1 VIEW COMPARISON:  06/05/2020 FINDINGS: Cardiomegaly. Lungs clear. No effusions or edema. No acute bony abnormality. IMPRESSION: Cardiomegaly.  No active disease. Electronically Signed   By: Rolm Baptise M.D.   On: 04/28/2021 16:05   DG Ankle Left Port  Result Date: 04/28/2021 CLINICAL DATA:  Postreduction EXAM: PORTABLE LEFT ANKLE - 2 VIEW COMPARISON:  04/28/2021 FINDINGS: Interval reduction of the previously seen dislocated left ankle. Improved alignment of the medial malleolar and lateral malleolar fractures with continued mild displacement. Small fracture fragment at the posterior corner of the tibia on the lateral view. IMPRESSION: Interval reduction of the dislocation. Improved alignment of the trimalleolar fracture. Electronically Signed   By: Rolm Baptise M.D.   On: 04/28/2021 19:16    Impression: Mucus in stool - GI pathogen panel pending - Hx of E. Coli infection 03/2021 treated with azithromycin - CT abdomen/pelvis 03/15/21: Severe distal diverticulosis without inflammation, cholelithiasis, nephrolithiasis.  Elevated Alk Phos: possibly from bone and/or gallstones - Alk phos 269 (164 one month ago), normal GGT - US Abdomen 10/16: cholelithiasis without acute cholecystitis. Fatty liver, bilateral renal cortical thinning, small volume perihepatic and right perinephric free fluid   Plan: Discussed with patient how mucus discharge after a course of ABX or change in bowel habits can be common. Recommended she continue to take Align probiotic. Suspect this will resolve in a few months.  Will continue to trend Alk phos. Imaging shows cholelithiasis.  [facility] will follow   LOS: 1 day   Garnette Scheuermann  PA-C 04/29/2021, 8:17 AM  Contact #  [phone]

## 2021-04-29 NOTE — Progress Notes (Signed)
   04/29/21 0114  Vitals  Temp 97.8 F (36.6 C)  Temp Source Oral  BP (!) 98/50  MAP (mmHg) (!) 63  BP Location Right Arm  BP Method Automatic  Patient Position (if appropriate) Lying  Pulse Rate (!) 52  Resp 16  Level of Consciousness  Level of Consciousness Alert  MEWS COLOR  MEWS Score Color Green  Oxygen Therapy  SpO2 96 %  O2 Device Room Air  MEWS Score  MEWS Temp 0  MEWS Systolic 1  MEWS Pulse 0  MEWS RR 0  MEWS LOC 0  MEWS Score 1  Reassessed in left arm

## 2021-04-29 NOTE — Consult Note (Addendum)
Patient ID: YARISA LYNAM MRN: [id number] DOB/AGE: 02-05-1946 75 y.o.  Admit date: 04/28/2021  Admission Diagnoses:  Active Problems:   Hypothyroidism   Morbid obesity ([place])   PAF (paroxysmal atrial fibrillation) (HCC)   Thrombocytopenia (HCC)   Mucoid diarrhea   Ankle fracture   Chronic diastolic CHF (congestive heart failure) (HCC)   Anemia   Prolonged QT interval   HPI: The patient is currently admitted at [facility] with left ankle fracture. Per patient, she fell at home and noticed immediate pain & swelling of left ankle on afternoon of 04/28/21. She was brought to [facility] ER where she was closed reduced. At baseline, she is an independent community ambulator who uses a walker as assistive device. Other PMH includes morbid obesity (BMI 45), paroxysmal atrial fibrillation, hypothyroidism, chronic diastolic heart failure, CKD stage 3, thrombocytopenia, anemia. She has had bilateral TKA (Dr. Maureen Ralphs) and doing well from these. She denies diabetes, smoking, VTE history and notes that she has full sensation of both feet. She lives at home with her husband.  Past Medical History: Past Medical History:  Diagnosis Date   Hematuria    evaluation with Dr. Joelyn Oms   Hypothyroidism 06/2010   Kidney stone 78/9381   Lichen sclerosus et atrophicus 2/13   biopsy proven   LVH (left ventricular hypertrophy)    Morbid obesity (HCC)    PAF (paroxysmal atrial fibrillation) ([place])    a. Remote hx, re-established care 01/2013 with Dr. Johnsie Cancel for recurrence. 2D echo 01/2013: mod LVH, EF 55-65%, no RWMA, grade 1 d/d. b. 06/2014: started on Xarelto.    Thrombocytopenia ([place])    Vitamin D deficiency 06/2010   Wears glasses     Surgical History: Past Surgical History:  Procedure Laterality Date   BREAST EXCISIONAL BIOPSY  2010   left--was a vascular lesion   CARPAL TUNNEL RELEASE Left 08/09/2013   Procedure: LEFT CARPAL TUNNEL RELEASE;  Surgeon: Cammie Sickle., MD;  Location: [place];  Service: Orthopedics;  Laterality: Left;   CATARACT EXTRACTION     COLONOSCOPY  04/2003       DILATION AND CURETTAGE OF UTERUS     HYSTEROSCOPY WITH D & C  8/99   REPLACEMENT TOTAL KNEE Bilateral 12/2006  07/2007    TONSILLECTOMY     TRIGGER FINGER RELEASE Left 08/09/2013   Procedure: LEFT A1/A2 PULLEY RELEASE;  Surgeon: Cammie Sickle., MD;  Location: [place];  Service: Orthopedics;  Laterality: Left;   TUBAL LIGATION  5/77    Family History: Family History  Problem Relation Age of Onset   Hypertension Mother    Kidney disease Mother    Alzheimer's disease Father    Diabetes Brother    Hypertension Brother    Spina bifida Daughter     Social History: Social History   Socioeconomic History   Marital status: Married    Spouse name: Not on file   Number of children: Not on file   Years of education: Not on file   Highest education level: Not on file  Occupational History   Not on file  Tobacco Use   Smoking status: Never   Smokeless tobacco: Never  Vaping Use   Vaping Use: Never used  Substance and Sexual Activity   Alcohol use: No   Drug use: No   Sexual activity: Yes    Partners: Male    Birth control/protection: Surgical  Other Topics Concern   Not on  file  Social History Narrative   Not on file   Social Determinants of Health   Financial Resource Strain: Not on file  Food Insecurity: Not on file  Transportation Needs: Not on file  Physical Activity: Not on file  Stress: Not on file  Social Connections: Not on file  Intimate Partner Violence: Not on file    Allergies: Patient has no known allergies.  Medications: I have reviewed the patient's current medications.  Vital Signs: Patient Vitals for the past 24 hrs:  BP Temp Temp src Pulse Resp SpO2  04/29/21 1433 (!) 115/55 97.9 F (36.6 C) Oral (!) 57 19 94 %  04/29/21 0933 (!) 117/48 98.6 F (37 C) Oral (!) 54 17 96 %  04/29/21 0448 (!) 108/48 97.7 F  (36.5 C) Oral (!) 52 16 97 %  04/29/21 0311 100/81 -- -- (!) 52 -- 96 %  04/29/21 0124 (!) 98/47 97.8 F (36.6 C) Oral (!) 52 14 95 %  04/29/21 0114 (!) 98/50 97.8 F (36.6 C) Oral (!) 52 16 96 %  04/28/21 2127 135/63 97.9 F (36.6 C) Oral (!) 56 16 98 %  04/28/21 1915 130/73 -- -- (!) 58 15 99 %  04/28/21 1900 129/61 -- -- 62 18 100 %  04/28/21 1845 127/70 -- -- (!) 59 15 100 %  04/28/21 1830 136/77 -- -- (!) 58 16 100 %  04/28/21 1815 133/72 -- -- (!) 56 (!) 22 100 %  04/28/21 1806 (!) 143/119 -- -- 68 19 100 %  04/28/21 1803 129/71 -- -- (!) 59 14 100 %  04/28/21 1758 (!) 143/119 -- -- 66 18 100 %    Radiology: DG Ankle Complete Left  Result Date: 04/28/2021 CLINICAL DATA:  Fall, injury, deformity EXAM: LEFT ANKLE COMPLETE - 3+ VIEW COMPARISON:  None. FINDINGS: There is fracture dislocation at the left ankle. Fracture through the medial malleolus and distal fibula. The talus is dislocated laterally relative to the tibia. Probable posterior malleolar fracture as well. IMPRESSION: Trimalleolar fracture dislocation. Electronically Signed   By: Rolm Baptise M.D.   On: 04/28/2021 16:05   US Abdomen Complete  Result Date: 04/28/2021 CLINICAL DATA:  Elevated alk-phos, AK I. EXAM: ABDOMEN ULTRASOUND COMPLETE COMPARISON:  CT March 15, 2021 FINDINGS: Gallbladder: Cholelithiasis measuring up to 2.1 cm. Gallbladder wall cholesterolosis. No gallbladder wall thickening visualized. No sonographic Murphy sign noted by sonographer. Common bile duct: Diameter: 5 mm Liver: No focal lesion identified. Diffusely increased parenchymal echogenicity. Portal vein is patent on color Doppler imaging with normal direction of blood flow towards the liver. IVC: No abnormality visualized. Pancreas: Visualized portion unremarkable. Spleen: Size and appearance within normal limits. Right Kidney: Length: 10.1 cm. Cortical thinning. No mass or hydronephrosis visualized. Left Kidney: Length: 9.4 cm. Cortical thinning.  No mass or hydronephrosis visualized. Abdominal aorta: No aneurysm visualized. Other findings: None. IMPRESSION: 1. Cholelithiasis without sonographic evidence of acute cholecystitis. 2. The echogenicity of the liver is increased. This is a nonspecific finding but is most commonly seen with fatty infiltration of the liver. There are no obvious focal liver lesions. 3. Bilateral renal cortical thinning as can be seen with medical renal disease. 4. Small volume perihepatic and right perinephric free fluid. Electronically Signed   By: Dahlia Bailiff M.D.   On: 04/28/2021 23:25   CT ANKLE LEFT WO CONTRAST  Result Date: 04/29/2021 CLINICAL DATA:  Fracture, ankle rule out posterior malleolus fracture; for preop planning EXAM: CT OF THE LEFT ANKLE WITHOUT CONTRAST  TECHNIQUE: Multidetector CT imaging of the left ankle was performed according to the standard protocol. Multiplanar CT image reconstructions were also generated. COMPARISON:  Same day radiograph FINDINGS: Bones/Joint/Cartilage There is a trimalleolar fracture with lateral subluxation of the talus with respect to the tibia. Medial malleolar fracture is displaced up to 9 mm. The oblique lateral malleolar fracture is posterolaterally displaced up to 6 mm. There is a posterior malleolar fracture which involves approximately 25% of the articular surface. The posterior malleolar fragment is displaced up to 3 mm. Diffuse osteopenia. Small bony fragments along the dorsal aspect of the talar head, suggesting dorsal capsular avulsion injury. Plantar calcaneal spurring. Partially visualized osteoarthritis in the tarsometatarsal joints. Mild talonavicular osteoarthritis. Ligaments Suboptimally assessed by CT. Muscles and Tendons No evidence of tendon entrapment.  Mild muscle atrophy. Soft tissues Extensive ankle soft tissue swelling.  No focal fluid collection. IMPRESSION: Trimalleolar ankle fracture with lateral subluxation of the talus with respect to the tibia and  mild fracture displacement as described above. The posterior malleolar fracture involves approximately 25% of the articular surface. Tiny bony fragments along the dorsal aspect of the talar head consistent with dorsal capsular avulsion fracture. Diffuse osteopenia. Electronically Signed   By: Maurine Simmering M.D.   On: 04/29/2021 10:55   DG Chest Port 1 View  Result Date: 04/28/2021 CLINICAL DATA:  Fall EXAM: PORTABLE CHEST 1 VIEW COMPARISON:  06/05/2020 FINDINGS: Cardiomegaly. Lungs clear. No effusions or edema. No acute bony abnormality. IMPRESSION: Cardiomegaly.  No active disease. Electronically Signed   By: Rolm Baptise M.D.   On: 04/28/2021 16:05   DG Ankle Left Port  Result Date: 04/28/2021 CLINICAL DATA:  Postreduction EXAM: PORTABLE LEFT ANKLE - 2 VIEW COMPARISON:  04/28/2021 FINDINGS: Interval reduction of the previously seen dislocated left ankle. Improved alignment of the medial malleolar and lateral malleolar fractures with continued mild displacement. Small fracture fragment at the posterior corner of the tibia on the lateral view. IMPRESSION: Interval reduction of the dislocation. Improved alignment of the trimalleolar fracture. Electronically Signed   By: Rolm Baptise M.D.   On: 04/28/2021 19:16    Labs: Recent Labs    04/28/21 1540 04/29/21 0518  WBC 6.1 5.4  RBC 3.18* 2.60*  2.57*  HCT 30.0* 24.7*  PLT 101* 85*   Recent Labs    04/28/21 1540 04/29/21 0518  NA 135 136  K 4.3 3.7  CL 105 106  CO2 24 23  BUN 21 26*  CREATININE 1.36* 1.56*  GLUCOSE 113* 86  CALCIUM 8.1* 7.9*   Recent Labs    04/28/21 1540  INR 1.5*    Review of Systems: As noted in HPI  Physical Exam: Body mass index is 45.16 kg/m.  Physical Exam   General: Well nourished, well developed. Awake, alert and oriented to time, place, person. Normal mood and affect. No apparent distress. Breathing room air.  Let lower extremity: Splint in place Moderate edema DP 2+ to palp CR<2s Wiggles  toes SILT throughout No pain with passive stretch of toes  Assessment and Plan: The patient has left trimalleolar ankle fracture dislocation s/p closed reduction splinting in ED, date of injury is 04/28/21.   The patient will need open reduction internal fixation of the left ankle. Due to acute injury and swelling, we will have to wait for her swelling to come down prior to surgery.  -pt is agreeable to proceed with operative fixation -will need to discuss with OR control desk on timing & coordinate staffing -primary admitting  team to provide medical clearance for surgery -most recent H&H is 7.7/24.7 - please optimize prior to surgery -ok for prophylactic anticoagulation per primary team -strict elevation of LLE  -NWB LLE in short leg splint   Armond Hang, MD Orthopaedic Surgeon [facility] [phone]

## 2021-04-29 NOTE — Progress Notes (Signed)
PROGRESS NOTE    Sally Wise  CZY:[id number] DOB: [birth year] DOA: 04/28/2021 PCP: [name], MD   Brief Narrative: Sally Wise is a 75 y.o. female with a history of atrial fibrillation, hypothyroidism, depression, diastolic heart failure, mucoid diarrhea. Patient presented after suffering a fall and found to have a left ankle fracture. Orthopedic surgery consulted.   Assessment & Plan:   Active Problems:   Hypothyroidism   Morbid obesity (HCC)   PAF (paroxysmal atrial fibrillation) (HCC)   Thrombocytopenia (HCC)   Mucoid diarrhea   Ankle fracture   Chronic diastolic CHF (congestive heart failure) (HCC)   Anemia   Prolonged QT interval   Left ankle fracture Secondary to fall. Splint placed. Orthopedic surgery consulted on admission. Per patient, there is a plan for surgical fixation. -Orthopedic surgery recommendations: pending  AKI Baseline creatinine of about 1. Creatinine of 1.36 on admission, up to 1.56 today. Overall seems euvolemic.  -If continues to worsen with IV fluids, may need to hold  Paroxysmal atrial fibrillation Not on anticoagulation. CHA2DS2-VASc Score is 4. On diltiazem as an outpatient. -Continue diltiazem  Mucoid diarrhea Chronic issue although patient clarifies that she isn't actually having diarrhea. C. Difficile ordered on admission. GI consulted. -GI recommendations: GI pathogen panel, possible trial of metamucil, probiotics  Hypothyroidism Patient is on Euthyrox as an outpatient -Continue Synthroid  Chronic diastolic heart failure Appears euvolemic. Not on lasix as an outpatient.  Thrombocytopenia Chronic. Patient follows with hematology.  Acute on chronic anemia Patient follows with hematology. Acute anemia of unknown etiology. No obvious evidence of bleeding -FOBT  Elevated INR Unsure of etiology. AST/ALT normal. ALP elevated. No obvious bleeding.  Elevated alkaline phosphatase Unsure of etiology. GGT normal.  Possible bone etiology but no obvious source. -CMP in AM  Prolonged QTc Noted  Morbid obesity Body mass index is 45.16 kg/m.   DVT prophylaxis: SCDs Code Status:   Code Status: Full Code Family Communication: Husband at bedside Disposition Plan: Discharge home vs SNF pending orthopedic surgery recommendations/management, PT/OT recommendations   Consultants:  Orthopedic surgery Gastroenterology  Procedures:  None  Antimicrobials: None    Subjective: Hoping to have surgery performed today.  Objective: Vitals:   04/29/21 0114 04/29/21 0124 04/29/21 0311 04/29/21 0448  BP: (!) 98/50 (!) 98/47 100/81 (!) 108/48  Pulse: (!) 52 (!) 52 (!) 52 (!) 52  Resp: 16 14  16   Temp: 97.8 F (36.6 C) 97.8 F (36.6 C)  97.7 F (36.5 C)  TempSrc: Oral Oral  Oral  SpO2: 96% 95% 96% 97%  Weight:      Height:        Intake/Output Summary (Last 24 hours) at 04/29/2021 0551 Last data filed at 04/29/2021 0434 Gross per 24 hour  Intake 1597.99 ml  Output --  Net 1597.99 ml   Filed Weights   04/28/21 1518  Weight: 134.7 kg    Examination:  General exam: Appears calm and comfortable Respiratory system: Clear to auscultation. Respiratory effort normal. Cardiovascular system: S1 & S2 heard, RRR. No murmurs, rubs, gallops or clicks. Gastrointestinal system: Abdomen is nondistended, soft and nontender. No organomegaly or masses felt. Normal bowel sounds heard. Central nervous system: Alert and oriented. No focal neurological deficits. Musculoskeletal: LLE in splint. No significant edema. No calf tenderness Skin: No cyanosis. No rashes Psychiatry: Judgement and insight appear normal. Mood & affect appropriate.     Data Reviewed: I have personally reviewed following labs and imaging studies  CBC Lab Results  Component  Value Date   WBC 6.1 04/28/2021   RBC 2.57 (L) 04/29/2021   HGB 9.3 (L) 04/28/2021   HCT 30.0 (L) 04/28/2021   MCV 94.3 04/28/2021   MCH 29.2 04/28/2021    PLT 101 (L) 04/28/2021   MCHC 31.0 04/28/2021   RDW 19.9 (H) 04/28/2021   LYMPHSABS 1.2 04/28/2021   MONOABS 1.0 04/28/2021   EOSABS 0.3 04/28/2021   BASOSABS 0.0 86/75/4492     Last metabolic panel Lab Results  Component Value Date   NA 135 04/28/2021   K 4.3 04/28/2021   CL 105 04/28/2021   CO2 24 04/28/2021   BUN 21 04/28/2021   CREATININE 1.36 (H) 04/28/2021   GLUCOSE 113 (H) 04/28/2021   GFRNONAA 40 (L) 04/28/2021   GFRAA 80 (L) 08/05/2013   CALCIUM 8.1 (L) 04/28/2021   PHOS 3.9 04/28/2021   PROT 6.4 (L) 04/28/2021   ALBUMIN 2.9 (L) 04/28/2021   BILITOT 0.8 04/28/2021   ALKPHOS 269 (H) 04/28/2021   AST 11 (L) 04/28/2021   ALT 11 04/28/2021   ANIONGAP 6 04/28/2021    CBG (last 3)  No results for input(s): GLUCAP in the last 72 hours.   GFR: Estimated Creatinine Clearance: 50.4 mL/min (A) (by C-G formula based on SCr of 1.36 mg/dL (H)).  Coagulation Profile: Recent Labs  Lab 04/28/21 1540  INR 1.5*    Recent Results (from the past 240 hour(s))  Resp Panel by RT-PCR (Flu A&B, Covid) Nasopharyngeal Swab     Status: None   Collection Time: 04/28/21  7:21 PM   Specimen: Nasopharyngeal Swab; Nasopharyngeal(NP) swabs in vial transport medium  Result Value Ref Range Status   SARS Coronavirus 2 by RT PCR NEGATIVE NEGATIVE Final    Comment: (NOTE) SARS-CoV-2 target nucleic acids are NOT DETECTED.  The SARS-CoV-2 RNA is generally detectable in upper respiratory specimens during the acute phase of infection. The lowest concentration of SARS-CoV-2 viral copies this assay can detect is 138 copies/mL. A negative result does not preclude SARS-Cov-2 infection and should not be used as the sole basis for treatment or other patient management decisions. A negative result may occur with  improper specimen collection/handling, submission of specimen other than nasopharyngeal swab, presence of viral mutation(s) within the areas targeted by this assay, and inadequate  number of viral copies(<138 copies/mL). A negative result must be combined with clinical observations, patient history, and epidemiological information. The expected result is Negative.  Fact Sheet for Patients:  EntrepreneurPulse.com.au  Fact Sheet for Healthcare Providers:  IncredibleEmployment.be  This test is no t yet approved or cleared by the [place] FDA and  has been authorized for detection and/or diagnosis of SARS-CoV-2 by FDA under an Emergency Use Authorization (EUA). This EUA will remain  in effect (meaning this test can be used) for the duration of the COVID-19 declaration under Section 564(b)(1) of the Act, 21 U.S.C.section 360bbb-3(b)(1), unless the authorization is terminated  or revoked sooner.       Influenza A by PCR NEGATIVE NEGATIVE Final   Influenza B by PCR NEGATIVE NEGATIVE Final    Comment: (NOTE) The Xpert Xpress SARS-CoV-2/FLU/RSV plus assay is intended as an aid in the diagnosis of influenza from Nasopharyngeal swab specimens and should not be used as a sole basis for treatment. Nasal washings and aspirates are unacceptable for Xpert Xpress SARS-CoV-2/FLU/RSV testing.  Fact Sheet for Patients: EntrepreneurPulse.com.au  Fact Sheet for Healthcare Providers: IncredibleEmployment.be  This test is not yet approved or cleared by the [place] FDA  and has been authorized for detection and/or diagnosis of SARS-CoV-2 by FDA under an Emergency Use Authorization (EUA). This EUA will remain in effect (meaning this test can be used) for the duration of the COVID-19 declaration under Section 564(b)(1) of the Act, 21 U.S.C. section 360bbb-3(b)(1), unless the authorization is terminated or revoked.  Performed at [facility] [place] [address], [place], [place] [address]         Radiology Studies: DG Ankle Complete Left  Result Date: 04/28/2021 CLINICAL  DATA:  Fall, injury, deformity EXAM: LEFT ANKLE COMPLETE - 3+ VIEW COMPARISON:  None. FINDINGS: There is fracture dislocation at the left ankle. Fracture through the medial malleolus and distal fibula. The talus is dislocated laterally relative to the tibia. Probable posterior malleolar fracture as well. IMPRESSION: Trimalleolar fracture dislocation. Electronically Signed   By: Rolm Baptise M.D.   On: 04/28/2021 16:05   US Abdomen Complete  Result Date: 04/28/2021 CLINICAL DATA:  Elevated alk-phos, AK I. EXAM: ABDOMEN ULTRASOUND COMPLETE COMPARISON:  CT March 15, 2021 FINDINGS: Gallbladder: Cholelithiasis measuring up to 2.1 cm. Gallbladder wall cholesterolosis. No gallbladder wall thickening visualized. No sonographic Murphy sign noted by sonographer. Common bile duct: Diameter: 5 mm Liver: No focal lesion identified. Diffusely increased parenchymal echogenicity. Portal vein is patent on color Doppler imaging with normal direction of blood flow towards the liver. IVC: No abnormality visualized. Pancreas: Visualized portion unremarkable. Spleen: Size and appearance within normal limits. Right Kidney: Length: 10.1 cm. Cortical thinning. No mass or hydronephrosis visualized. Left Kidney: Length: 9.4 cm. Cortical thinning. No mass or hydronephrosis visualized. Abdominal aorta: No aneurysm visualized. Other findings: None. IMPRESSION: 1. Cholelithiasis without sonographic evidence of acute cholecystitis. 2. The echogenicity of the liver is increased. This is a nonspecific finding but is most commonly seen with fatty infiltration of the liver. There are no obvious focal liver lesions. 3. Bilateral renal cortical thinning as can be seen with medical renal disease. 4. Small volume perihepatic and right perinephric free fluid. Electronically Signed   By: Dahlia Bailiff M.D.   On: 04/28/2021 23:25   DG Chest Port 1 View  Result Date: 04/28/2021 CLINICAL DATA:  Fall EXAM: PORTABLE CHEST 1 VIEW COMPARISON:   06/05/2020 FINDINGS: Cardiomegaly. Lungs clear. No effusions or edema. No acute bony abnormality. IMPRESSION: Cardiomegaly.  No active disease. Electronically Signed   By: Rolm Baptise M.D.   On: 04/28/2021 16:05   DG Ankle Left Port  Result Date: 04/28/2021 CLINICAL DATA:  Postreduction EXAM: PORTABLE LEFT ANKLE - 2 VIEW COMPARISON:  04/28/2021 FINDINGS: Interval reduction of the previously seen dislocated left ankle. Improved alignment of the medial malleolar and lateral malleolar fractures with continued mild displacement. Small fracture fragment at the posterior corner of the tibia on the lateral view. IMPRESSION: Interval reduction of the dislocation. Improved alignment of the trimalleolar fracture. Electronically Signed   By: Rolm Baptise M.D.   On: 04/28/2021 19:16        Scheduled Meds:  busPIRone  15 mg Oral BID   diltiazem  180 mg Oral Daily   [START ON 04/30/2021] influenza vaccine adjuvanted  0.5 mL Intramuscular Tomorrow-1000   levothyroxine  112 mcg Oral Q0600   sertraline  50 mg Oral Daily   Continuous Infusions:  sodium chloride 75 mL/hr at 04/29/21 0434   methocarbamol (ROBAXIN) IV       LOS: 1 day     Cordelia Poche, MD [facility] 04/29/2021, 5:51 AM  If 7PM-7AM, please contact night-coverage [email or web address]

## 2021-04-29 NOTE — Progress Notes (Signed)
   04/29/21 0114 04/29/21 0124  Vitals  Temp 97.8 F (36.6 C) 97.8 F (36.6 C)  Temp Source  --  Oral  BP (!) 98/50 (!) 98/47  MAP (mmHg)  --  (!) 62  BP Location  --  Left Arm  BP Method  --  Automatic  Patient Position (if appropriate)  --  Lying  Pulse Rate (!) 52 (!) 52  Resp 16 14  Level of Consciousness  Level of Consciousness Alert Alert  MEWS COLOR  MEWS Score Color Green Green  Oxygen Therapy  SpO2  --  95 %  O2 Device  --  Room Air  MEWS Score  MEWS Temp 0 0  MEWS Systolic 1 1  MEWS Pulse 0 0  MEWS RR 0 0  MEWS LOC 0 0  MEWS Score 1 1  Assessed pt alert and oriented x4. Informed dr. Rennis Petty decreased b/p new order for blous NS rate 500.

## 2021-04-30 DIAGNOSIS — E039 Hypothyroidism, unspecified: Secondary | ICD-10-CM | POA: Diagnosis not present

## 2021-04-30 DIAGNOSIS — S82892A Other fracture of left lower leg, initial encounter for closed fracture: Secondary | ICD-10-CM | POA: Diagnosis not present

## 2021-04-30 DIAGNOSIS — I480 Paroxysmal atrial fibrillation: Secondary | ICD-10-CM | POA: Diagnosis not present

## 2021-04-30 DIAGNOSIS — I5032 Chronic diastolic (congestive) heart failure: Secondary | ICD-10-CM | POA: Diagnosis not present

## 2021-04-30 LAB — COMPREHENSIVE METABOLIC PANEL
ALT: 12 U/L (ref 0–44)
AST: 18 U/L (ref 15–41)
Albumin: 2.5 g/dL — ABNORMAL LOW (ref 3.5–5.0)
Alkaline Phosphatase: 240 U/L — ABNORMAL HIGH (ref 38–126)
Anion gap: 8 (ref 5–15)
BUN: 29 mg/dL — ABNORMAL HIGH (ref 8–23)
CO2: 21 mmol/L — ABNORMAL LOW (ref 22–32)
Calcium: 7.7 mg/dL — ABNORMAL LOW (ref 8.9–10.3)
Chloride: 104 mmol/L (ref 98–111)
Creatinine, Ser: 1.53 mg/dL — ABNORMAL HIGH (ref 0.44–1.00)
GFR, Estimated: 35 mL/min — ABNORMAL LOW (ref >60)
Glucose, Bld: 103 mg/dL — ABNORMAL HIGH (ref 70–99)
Potassium: 4 mmol/L (ref 3.5–5.1)
Sodium: 133 mmol/L — ABNORMAL LOW (ref 135–145)
Total Bilirubin: 0.4 mg/dL (ref 0.3–1.2)
Total Protein: 5.8 g/dL — ABNORMAL LOW (ref 6.5–8.1)

## 2021-04-30 LAB — CBC
HCT: 24.7 % — ABNORMAL LOW (ref 36.0–46.0)
Hemoglobin: 7.5 g/dL — ABNORMAL LOW (ref 12.0–15.0)
MCH: 29.4 pg (ref 26.0–34.0)
MCHC: 30.4 g/dL (ref 30.0–36.0)
MCV: 96.9 fL (ref 80.0–100.0)
Platelets: 92 10*3/uL — ABNORMAL LOW (ref 150–400)
RBC: 2.55 MIL/uL — ABNORMAL LOW (ref 3.87–5.11)
RDW: 20.1 % — ABNORMAL HIGH (ref 11.5–15.5)
WBC: 4.9 10*3/uL (ref 4.0–10.5)
nRBC: 0 % (ref 0.0–0.2)

## 2021-04-30 MED ORDER — ADULT MULTIVITAMIN W/MINERALS CH
1.0000 | ORAL_TABLET | Freq: Every day | ORAL | Status: DC
  Administered 2021-04-30 – 2021-05-13 (×13): 1 via ORAL
  Filled 2021-04-30 (×14): qty 1

## 2021-04-30 MED ORDER — CALCIUM CARBONATE ANTACID 500 MG PO CHEW
1.0000 | CHEWABLE_TABLET | Freq: Three times a day (TID) | ORAL | Status: AC
  Administered 2021-04-30 – 2021-05-02 (×6): 200 mg via ORAL
  Filled 2021-04-30 (×5): qty 1

## 2021-04-30 MED ORDER — ENSURE MAX PROTEIN PO LIQD
11.0000 [oz_av] | Freq: Two times a day (BID) | ORAL | Status: DC
  Administered 2021-04-30: 11 [oz_av] via ORAL
  Administered 2021-04-30: 11 mL via ORAL
  Administered 2021-05-01 – 2021-05-12 (×9): 11 [oz_av] via ORAL
  Filled 2021-04-30 (×28): qty 330

## 2021-04-30 NOTE — Care Plan (Addendum)
Orthopaedic Surgery Plan of Care Note  -met with patient yesterday and she is agreeable for left ankle open reduction internal fixation vs possible external fixation of her complex trimalleolar ankle fracture (see consult note for more details) -prior to surgery, she will need soft tissue rest with aggressive elevation of left lower extremity in splint to reduce swelling -she will also need medical optimization, especially for her anemia -to allow enough time for above prior to surgery, d/w OR control desk and obtained OR time for Monday 10/24 at 11:30 AM for surgery -ok for VTE prophylaxis per primary team (Lovenox 40 mg once daily or per primary team discretion - hold on day of surgery) -Ortho will follow  Armond Hang, MD Orthopaedic Surgery [facility]

## 2021-04-30 NOTE — Progress Notes (Signed)
PROGRESS NOTE    Sally Wise  OEU:[id number] DOB: [birth year] DOA: 04/28/2021 PCP: [name], MD   Brief Narrative: Sally Wise is a 75 y.o. female with a history of atrial fibrillation, hypothyroidism, depression, diastolic heart failure, mucoid diarrhea. Patient presented after suffering a fall and found to have a left ankle fracture. Orthopedic surgery consulted.   Assessment & Plan:   Active Problems:   Hypothyroidism   Morbid obesity (HCC)   PAF (paroxysmal atrial fibrillation) (HCC)   Thrombocytopenia (HCC)   Mucoid diarrhea   Ankle fracture   Chronic diastolic CHF (congestive heart failure) (HCC)   Anemia   Prolonged QT interval   Left ankle fracture Secondary to fall. Splint placed. Orthopedic surgery consulted on admission. Per patient, there is a plan for surgical fixation. -Orthopedic surgery recommendations: plan for surgery next week pending improvement of ankle swelling  AKI Baseline creatinine of about 1. Creatinine of 1.36 on admission, up to 1.56. Overall seems euvolemic.  -If continues to worsen with IV fluids, may need to hold -BMP today; pending  Paroxysmal atrial fibrillation Not on anticoagulation. CHA2DS2-VASc Score is 4. On diltiazem as an outpatient. -Continue diltiazem  Mucoid diarrhea Chronic issue although patient clarifies that she isn't actually having diarrhea. C. Difficile ordered on admission. GI consulted. -GI recommendations (Signed off 10/18): GI pathogen panel, consider probiotics, outpatient follow-up  Hypothyroidism Patient is on Euthyrox as an outpatient -Continue Synthroid  Chronic diastolic heart failure Appears euvolemic. Not on lasix as an outpatient.  Thrombocytopenia Chronic. Patient follows with hematology.  Acute on chronic anemia Patient follows with hematology. Acute anemia of unknown etiology. No obvious evidence of bleeding -FOBT  Elevated INR Unsure of etiology. AST/ALT normal. ALP  elevated. No obvious bleeding.  Elevated alkaline phosphatase Unsure of etiology. GGT normal. Possible bone etiology but no obvious source. -CMP pending  Prolonged QTc Noted  Morbid obesity Body mass index is 45.16 kg/m.   DVT prophylaxis: SCDs Code Status:   Code Status: Full Code Family Communication: Husband at bedside Disposition Plan: Discharge home vs SNF pending orthopedic surgery recommendations/management, PT/OT recommendations   Consultants:  Orthopedic surgery Gastroenterology  Procedures:  None  Antimicrobials: None    Subjective: No bowel movement.   Objective: Vitals:   04/29/21 0933 04/29/21 1433 04/29/21 2045 04/30/21 0409  BP: (!) 117/48 (!) 115/55 (!) 108/46 117/60  Pulse: (!) 54 (!) 57 65 64  Resp: 17 19 20 20   Temp: 98.6 F (37 C) 97.9 F (36.6 C) 98.3 F (36.8 C) 98.1 F (36.7 C)  TempSrc: Oral Oral Oral Oral  SpO2: 96% 94% 96% 97%  Weight:      Height:        Intake/Output Summary (Last 24 hours) at 04/30/2021 1318 Last data filed at 04/30/2021 1100 Gross per 24 hour  Intake 920.54 ml  Output 750 ml  Net 170.54 ml    Filed Weights   04/28/21 1518  Weight: 134.7 kg    Examination:  General exam: Appears calm and comfortable Respiratory system: Clear to auscultation. Respiratory effort normal. Cardiovascular system: S1 & S2 heard, RRR. No murmurs, rubs, gallops or clicks. Gastrointestinal system: Morbidly obese but abdomen seems nondistended, soft and nontender. No organomegaly or masses felt. Normal bowel sounds heard. Central nervous system: Alert and oriented. No focal neurological deficits. Musculoskeletal: Some left foot edema located dorsally. No calf tenderness Skin: No cyanosis. No rashes Psychiatry: Judgement and insight appear normal. Mood & affect appropriate.     Data  Reviewed: I have personally reviewed following labs and imaging studies  CBC Lab Results  Component Value Date   WBC 4.9 04/30/2021   RBC  2.55 (L) 04/30/2021   HGB 7.5 (L) 04/30/2021   HCT 24.7 (L) 04/30/2021   MCV 96.9 04/30/2021   MCH 29.4 04/30/2021   PLT 92 (L) 04/30/2021   MCHC 30.4 04/30/2021   RDW 20.1 (H) 04/30/2021   LYMPHSABS 1.2 04/28/2021   MONOABS 1.0 04/28/2021   EOSABS 0.3 04/28/2021   BASOSABS 0.0 60/15/6153     Last metabolic panel Lab Results  Component Value Date   NA 136 04/29/2021   K 3.7 04/29/2021   CL 106 04/29/2021   CO2 23 04/29/2021   BUN 26 (H) 04/29/2021   CREATININE 1.56 (H) 04/29/2021   GLUCOSE 86 04/29/2021   GFRNONAA 34 (L) 04/29/2021   GFRAA 80 (L) 08/05/2013   CALCIUM 7.9 (L) 04/29/2021   PHOS 3.9 04/28/2021   PROT 6.4 (L) 04/28/2021   ALBUMIN 2.9 (L) 04/28/2021   BILITOT 0.8 04/28/2021   ALKPHOS 269 (H) 04/28/2021   AST 11 (L) 04/28/2021   ALT 11 04/28/2021   ANIONGAP 7 04/29/2021    CBG (last 3)  No results for input(s): GLUCAP in the last 72 hours.   GFR: Estimated Creatinine Clearance: 44 mL/min (A) (by C-G formula based on SCr of 1.56 mg/dL (H)).  Coagulation Profile: Recent Labs  Lab 04/28/21 1540  INR 1.5*     Recent Results (from the past 240 hour(s))  Resp Panel by RT-PCR (Flu A&B, Covid) Nasopharyngeal Swab     Status: None   Collection Time: 04/28/21  7:21 PM   Specimen: Nasopharyngeal Swab; Nasopharyngeal(NP) swabs in vial transport medium  Result Value Ref Range Status   SARS Coronavirus 2 by RT PCR NEGATIVE NEGATIVE Final    Comment: (NOTE) SARS-CoV-2 target nucleic acids are NOT DETECTED.  The SARS-CoV-2 RNA is generally detectable in upper respiratory specimens during the acute phase of infection. The lowest concentration of SARS-CoV-2 viral copies this assay can detect is 138 copies/mL. A negative result does not preclude SARS-Cov-2 infection and should not be used as the sole basis for treatment or other patient management decisions. A negative result may occur with  improper specimen collection/handling, submission of specimen  other than nasopharyngeal swab, presence of viral mutation(s) within the areas targeted by this assay, and inadequate number of viral copies(<138 copies/mL). A negative result must be combined with clinical observations, patient history, and epidemiological information. The expected result is Negative.  Fact Sheet for Patients:  EntrepreneurPulse.com.au  Fact Sheet for Healthcare Providers:  IncredibleEmployment.be  This test is no t yet approved or cleared by the [place] FDA and  has been authorized for detection and/or diagnosis of SARS-CoV-2 by FDA under an Emergency Use Authorization (EUA). This EUA will remain  in effect (meaning this test can be used) for the duration of the COVID-19 declaration under Section 564(b)(1) of the Act, 21 U.S.C.section 360bbb-3(b)(1), unless the authorization is terminated  or revoked sooner.       Influenza A by PCR NEGATIVE NEGATIVE Final   Influenza B by PCR NEGATIVE NEGATIVE Final    Comment: (NOTE) The Xpert Xpress SARS-CoV-2/FLU/RSV plus assay is intended as an aid in the diagnosis of influenza from Nasopharyngeal swab specimens and should not be used as a sole basis for treatment. Nasal washings and aspirates are unacceptable for Xpert Xpress SARS-CoV-2/FLU/RSV testing.  Fact Sheet for Patients: EntrepreneurPulse.com.au  Fact Sheet for  Healthcare Providers: IncredibleEmployment.be  This test is not yet approved or cleared by the [place] and has been authorized for detection and/or diagnosis of SARS-CoV-2 by FDA under an Emergency Use Authorization (EUA). This EUA will remain in effect (meaning this test can be used) for the duration of the COVID-19 declaration under Section 564(b)(1) of the Act, 21 U.S.C. section 360bbb-3(b)(1), unless the authorization is terminated or revoked.  Performed at [facility], [place] [address], [place],  [address]          Radiology Studies: DG Ankle Complete Left  Result Date: 04/28/2021 CLINICAL DATA:  Fall, injury, deformity EXAM: LEFT ANKLE COMPLETE - 3+ VIEW COMPARISON:  None. FINDINGS: There is fracture dislocation at the left ankle. Fracture through the medial malleolus and distal fibula. The talus is dislocated laterally relative to the tibia. Probable posterior malleolar fracture as well. IMPRESSION: Trimalleolar fracture dislocation. Electronically Signed   By: Rolm Baptise M.D.   On: 04/28/2021 16:05   US Abdomen Complete  Result Date: 04/28/2021 CLINICAL DATA:  Elevated alk-phos, AK I. EXAM: ABDOMEN ULTRASOUND COMPLETE COMPARISON:  CT March 15, 2021 FINDINGS: Gallbladder: Cholelithiasis measuring up to 2.1 cm. Gallbladder wall cholesterolosis. No gallbladder wall thickening visualized. No sonographic Murphy sign noted by sonographer. Common bile duct: Diameter: 5 mm Liver: No focal lesion identified. Diffusely increased parenchymal echogenicity. Portal vein is patent on color Doppler imaging with normal direction of blood flow towards the liver. IVC: No abnormality visualized. Pancreas: Visualized portion unremarkable. Spleen: Size and appearance within normal limits. Right Kidney: Length: 10.1 cm. Cortical thinning. No mass or hydronephrosis visualized. Left Kidney: Length: 9.4 cm. Cortical thinning. No mass or hydronephrosis visualized. Abdominal aorta: No aneurysm visualized. Other findings: None. IMPRESSION: 1. Cholelithiasis without sonographic evidence of acute cholecystitis. 2. The echogenicity of the liver is increased. This is a nonspecific finding but is most commonly seen with fatty infiltration of the liver. There are no obvious focal liver lesions. 3. Bilateral renal cortical thinning as can be seen with medical renal disease. 4. Small volume perihepatic and right perinephric free fluid. Electronically Signed   By: Dahlia Bailiff M.D.   On: 04/28/2021  23:25   CT ANKLE LEFT WO CONTRAST  Result Date: 04/29/2021 CLINICAL DATA:  Fracture, ankle rule out posterior malleolus fracture; for preop planning EXAM: CT OF THE LEFT ANKLE WITHOUT CONTRAST TECHNIQUE: Multidetector CT imaging of the left ankle was performed according to the standard protocol. Multiplanar CT image reconstructions were also generated. COMPARISON:  Same day radiograph FINDINGS: Bones/Joint/Cartilage There is a trimalleolar fracture with lateral subluxation of the talus with respect to the tibia. Medial malleolar fracture is displaced up to 9 mm. The oblique lateral malleolar fracture is posterolaterally displaced up to 6 mm. There is a posterior malleolar fracture which involves approximately 25% of the articular surface. The posterior malleolar fragment is displaced up to 3 mm. Diffuse osteopenia. Small bony fragments along the dorsal aspect of the talar head, suggesting dorsal capsular avulsion injury. Plantar calcaneal spurring. Partially visualized osteoarthritis in the tarsometatarsal joints. Mild talonavicular osteoarthritis. Ligaments Suboptimally assessed by CT. Muscles and Tendons No evidence of tendon entrapment.  Mild muscle atrophy. Soft tissues Extensive ankle soft tissue swelling.  No focal fluid collection. IMPRESSION: Trimalleolar ankle fracture with lateral subluxation of the talus with respect to the tibia and mild fracture displacement as described above. The posterior malleolar fracture involves approximately 25% of the articular surface. Tiny bony fragments along the dorsal aspect of the  talar head consistent with dorsal capsular avulsion fracture. Diffuse osteopenia. Electronically Signed   By: Maurine Simmering M.D.   On: 04/29/2021 10:55   DG Chest Port 1 View  Result Date: 04/28/2021 CLINICAL DATA:  Fall EXAM: PORTABLE CHEST 1 VIEW COMPARISON:  06/05/2020 FINDINGS: Cardiomegaly. Lungs clear. No effusions or edema. No acute bony abnormality. IMPRESSION: Cardiomegaly.  No  active disease. Electronically Signed   By: Rolm Baptise M.D.   On: 04/28/2021 16:05   DG Ankle Left Port  Result Date: 04/28/2021 CLINICAL DATA:  Postreduction EXAM: PORTABLE LEFT ANKLE - 2 VIEW COMPARISON:  04/28/2021 FINDINGS: Interval reduction of the previously seen dislocated left ankle. Improved alignment of the medial malleolar and lateral malleolar fractures with continued mild displacement. Small fracture fragment at the posterior corner of the tibia on the lateral view. IMPRESSION: Interval reduction of the dislocation. Improved alignment of the trimalleolar fracture. Electronically Signed   By: Rolm Baptise M.D.   On: 04/28/2021 19:16        Scheduled Meds:  busPIRone  15 mg Oral BID   diltiazem  180 mg Oral Daily   influenza vaccine adjuvanted  0.5 mL Intramuscular Tomorrow-1000   levothyroxine  112 mcg Oral Q0600   sertraline  50 mg Oral Daily   Continuous Infusions:  sodium chloride 75 mL/hr at 04/30/21 0313   methocarbamol (ROBAXIN) IV       LOS: 2 days     Cordelia Poche, MD [facility] 04/30/2021, 1:18 PM  If 7PM-7AM, please contact night-coverage [email or web address]

## 2021-04-30 NOTE — Progress Notes (Signed)
[facility] Progress Note  BRICELYN FREESTONE 75 y.o. 06-29-1946  CC:  Mucus in stool   Subjective: Patient states she is doing okay today, she is frustrated with her breakfast being ice cold this morning. She is also concerned with the length of time it will take for mucus in stool to resolve. She is waiting to get ankle surgery once leg swelling improves.  ROS : Review of Systems  Cardiovascular:  Negative for chest pain and palpitations.  Gastrointestinal:  Negative for abdominal pain, blood in stool, constipation, diarrhea, heartburn, melena, nausea and vomiting.     Objective: Vital signs in last 24 hours: Vitals:   04/29/21 2045 04/30/21 0409  BP: (!) 108/46 117/60  Pulse: 65 64  Resp: 20 20  Temp: 98.3 F (36.8 C) 98.1 F (36.7 C)  SpO2: 96% 97%    Physical Exam:  General:  Alert, cooperative, no distress, husband at bedside  Head:  Normocephalic, without obvious abnormality, atraumatic  Eyes:  Anicteric sclera, EOM's intact  Lungs:   Clear to auscultation bilaterally, respirations unlabored  Heart:  Regular rate and rhythm, S1, S2 normal  Abdomen:   Soft, non-tender, bowel sounds active all four quadrants,  no masses,     Lab Results: Recent Labs    04/28/21 1540 04/28/21 1915 04/29/21 0518  NA 135  --  136  K 4.3  --  3.7  CL 105  --  106  CO2 24  --  23  GLUCOSE 113*  --  86  BUN 21  --  26*  CREATININE 1.36*  --  1.56*  CALCIUM 8.1*  --  7.9*  MG  --  2.0  --   PHOS  --  3.9  --    Recent Labs    04/28/21 1540  AST 11*  ALT 11  ALKPHOS 269*  BILITOT 0.8  PROT 6.4*  ALBUMIN 2.9*   Recent Labs    04/28/21 1540 04/29/21 0518 04/30/21 0446  WBC 6.1 5.4 4.9  NEUTROABS 3.5  --   --   HGB 9.3* 7.7* 7.5*  HCT 30.0* 24.7* 24.7*  MCV 94.3 95.0 96.9  PLT 101* 85* 92*   Recent Labs    04/28/21 1540  LABPROT 18.0*  INR 1.5*      Assessment Mucus in stool - GI pathogen panel hasn't been collected yet - 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   Elevated Alk Phos: possibly from bone and/or gallstones - Alk phos 269 (164 one month ago), normal GGT - US Abdomen 10/16: cholelithiasis without acute cholecystitis. Fatty liver, bilateral renal cortical thinning, small volume perihepatic and right perinephric free fluid   Plan: Reassured patient there is no need for ABX at this time for mucus in the stool. GI pathogen panel to be collected for evaluation of any ongoing infection.  Recommend continuing Align probiotic as well as adding on metamucil.  Can be seen as an outpatient follow up in 2-4 weeks, appears she is already scheduled for an appt in November.  [facility] will sign off. Please contact us if we can be of any further assistance during this hospital stay.   Tulip Meharg Radford Pax PA-C 04/30/2021, 8:30 AM  Contact #  [phone]

## 2021-04-30 NOTE — Progress Notes (Signed)
Initial Nutrition Assessment  DOCUMENTATION CODES:   Morbid obesity  INTERVENTION:   -Ensure MAX Protein po BID, each supplement provides 150 kcal and 30 grams of protein   -Multivitamin with minerals daily  -Recommend diet liberalization to regular  NUTRITION DIAGNOSIS:   Increased nutrient needs related to diarrhea (ankle fracture) as evidenced by estimated needs.  GOAL:   Patient will meet greater than or equal to 90% of their needs  MONITOR:   PO intake, Supplement acceptance, Labs, Weight trends, I & O's  REASON FOR ASSESSMENT:   Consult Hip fracture protocol  ASSESSMENT:   75 y.o. female with medical history significant of a.fib, synthroid, depression, HLD, HTN, obesity, chronic diastolic CHF   Admitted for  Left ankle fracture and AKI  Currently awaiting surgery for left ankle fracture. Per ortho note, pt scheduled for surgery 10/24.  Patient reports being very frustrated with her current diet  (heart healthy) and states it has no flavor. Also, her breakfast was cold this morning so she didn't eat much. Pt would like regular diet, messaged MD to have this changed.  Pt reports she has been having chronic diarrhea for months. 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 GI recommending probiotics. Checking for c.diff once pt has a BM.   Per weight records, pt has lost 34 lbs on 06/07/20 (10% wt loss x 11 months, insignificant for time frame).   Medications reviewed.  Labs reviewed.  NUTRITION - FOCUSED PHYSICAL EXAM:  No depletions noted.  Diet Order:   Diet Order             Diet Heart Room service appropriate? Yes; Fluid consistency: Thin  Diet effective now                   EDUCATION NEEDS:   Education needs have been addressed  Skin:  Skin Assessment: Reviewed RN Assessment  Last BM:  10/16  Height:   Ht Readings from Last 1 Encounters:  04/28/21 5\' 8"  (1.727 m)    Weight:   Wt Readings from Last 1 Encounters:   04/28/21 134.7 kg    BMI:  Body mass index is 45.16 kg/m.  Estimated Nutritional Needs:   Kcal:  2100-2300  Protein:  95-110g  Fluid:  2L/day   04/30/21, MS, RD, LDN Inpatient Clinical Dietitian Contact information available via Amion

## 2021-04-30 NOTE — Plan of Care (Signed)

## 2021-05-01 ENCOUNTER — Inpatient Hospital Stay (HOSPITAL_COMMUNITY): Payer: Medicare Other

## 2021-05-01 DIAGNOSIS — D62 Acute posthemorrhagic anemia: Secondary | ICD-10-CM

## 2021-05-01 DIAGNOSIS — Z01810 Encounter for preprocedural cardiovascular examination: Secondary | ICD-10-CM

## 2021-05-01 DIAGNOSIS — I5032 Chronic diastolic (congestive) heart failure: Secondary | ICD-10-CM

## 2021-05-01 DIAGNOSIS — I480 Paroxysmal atrial fibrillation: Secondary | ICD-10-CM | POA: Diagnosis not present

## 2021-05-01 DIAGNOSIS — N179 Acute kidney failure, unspecified: Secondary | ICD-10-CM | POA: Diagnosis not present

## 2021-05-01 LAB — CBC
HCT: 27.3 % — ABNORMAL LOW (ref 36.0–46.0)
Hemoglobin: 8.5 g/dL — ABNORMAL LOW (ref 12.0–15.0)
MCH: 29.4 pg (ref 26.0–34.0)
MCHC: 31.1 g/dL (ref 30.0–36.0)
MCV: 94.5 fL (ref 80.0–100.0)
Platelets: 86 10*3/uL — ABNORMAL LOW (ref 150–400)
RBC: 2.89 MIL/uL — ABNORMAL LOW (ref 3.87–5.11)
RDW: 19.2 % — ABNORMAL HIGH (ref 11.5–15.5)
WBC: 6.3 10*3/uL (ref 4.0–10.5)
nRBC: 0.3 % — ABNORMAL HIGH (ref 0.0–0.2)

## 2021-05-01 LAB — BASIC METABOLIC PANEL
Anion gap: 6 (ref 5–15)
BUN: 27 mg/dL — ABNORMAL HIGH (ref 8–23)
CO2: 24 mmol/L (ref 22–32)
Calcium: 7.9 mg/dL — ABNORMAL LOW (ref 8.9–10.3)
Chloride: 107 mmol/L (ref 98–111)
Creatinine, Ser: 1.15 mg/dL — ABNORMAL HIGH (ref 0.44–1.00)
GFR, Estimated: 49 mL/min — ABNORMAL LOW (ref >60)
Glucose, Bld: 125 mg/dL — ABNORMAL HIGH (ref 70–99)
Potassium: 4.1 mmol/L (ref 3.5–5.1)
Sodium: 137 mmol/L (ref 135–145)

## 2021-05-01 LAB — PREPARE RBC (CROSSMATCH)

## 2021-05-01 MED ORDER — SODIUM CHLORIDE 0.9% IV SOLUTION: Freq: Once | INTRAVENOUS | Status: DC

## 2021-05-01 MED ORDER — ENOXAPARIN SODIUM 60 MG/0.6ML IJ SOSY
60.0000 mg | PREFILLED_SYRINGE | INTRAMUSCULAR | Status: DC
Start: 2021-05-01 — End: 2021-05-06
  Administered 2021-05-01 – 2021-05-05 (×5): 60 mg via SUBCUTANEOUS
  Filled 2021-05-01 (×5): qty 0.6

## 2021-05-01 NOTE — Progress Notes (Addendum)
PROGRESS NOTE    Sally Wise  DVV:[id number] DOB: 10/10/1945 DOA: 04/28/2021 PCP: Daisy Floro, MD   Brief Narrative: Sally Wise is a 75 y.o. female with a history of atrial fibrillation, hypothyroidism, depression, chronic diastolic heart failure, mucoid diarrhea. Patient presented after suffering a fall and found to have a left ankle fracture. Orthopedic surgery consulted and plan surgery on 10/24 pending improvement of swelling around the ankle fracture and medical optimization.   Assessment & Plan:   Active Problems:   Hypothyroidism   Morbid obesity (HCC)   PAF (paroxysmal atrial fibrillation) (HCC)   Thrombocytopenia (HCC)   Mucoid diarrhea   Ankle fracture   Chronic diastolic CHF (congestive heart failure) (HCC)   Anemia   Prolonged QT interval   Left ankle fracture Secondary to fall. Splint placed.  -Orthopedic surgery recommendations appreciated: Plan for left ankle ORIF versus possible external fixation of complex trimalleolar ankle fracture on 10/24 at 11:30 AM.  Delay in surgery so that there is improvement in the soft tissue edema around the fracture.  Also needs medical optimization i.e. improvement in her anemia. -Requested preop cardiology clearance/sees Dr.Nishan.  Cardiology input appreciated and indicate low preoperative cardiac risk for upcoming ankle surgery.  No further evaluation planned. - Pharmacy communicated with patient's primary hematologist and initiated weight-based Lovenox for DVT prophylaxis-to be held on day of surgery.  AKI Baseline creatinine of about 1. Creatinine of 1.36 on admission, up to 1.56. Overall seems euvolemic.  -Unfortunately no BMP ordered for today.  Will order now.  Creatinine had plateaued at 1.53 over the last 2 days. - Check bladder scan and renal ultrasound to rule out obstructive etiologies.,  Although felt less likely. - Clinically appears euvolemic, DC IV fluids. - Not on any nephrotoxic's medication at  this time. Follow daily BMP.  Paroxysmal atrial fibrillation Not on anticoagulation, unclear etiology.. CHA2DS2-VASc Score is 4. On diltiazem as an outpatient. -Continue diltiazem  Mucoid diarrhea Chronic issue although patient clarifies that she isn't actually having diarrhea.  GI panel PCR positive for enteropathogenic E. coli a month ago. -GI recommendations (Signed off 10/18): GI pathogen panel, consider probiotics-continue align, outpatient follow-up - Reportedly had sigmoidoscopy with Dr. Matthias Hughs recently.  Hypothyroidism Patient is on Euthyrox as an outpatient -Continue Synthroid  Chronic diastolic heart failure Appears euvolemic. Not on lasix as an outpatient.  Thrombocytopenia Chronic. Patient follows with hematology.  Acute on chronic anemia Patient follows with hematology. Acute anemia of unknown etiology. No obvious evidence of bleeding -FOBT - Unclear etiology.  Baseline hemoglobin appears to be in the 9-10 g range.  Currently stable in the mid 7 g range.?  Related to fracture and internal blood loss. Patient symptomatic with generalized weakness and malaise. Transfusing 1 unit PRBC and follow posttransfusion CBC.  Patient agreeable.  Elevated INR Unsure of etiology. AST/ALT normal. ALP elevated. No obvious bleeding.  Elevated alkaline phosphatase Unsure of etiology. GGT normal. Possible bone etiology but no obvious source. -CMP pending  Prolonged QTc Noted.  Potassium was 4 and magnesium 2 recently.  Follow-up EKG periodically and minimize use of QT prolonging medications.  Morbid obesity Body mass index is 45.16 kg/m.   DVT prophylaxis: SCDs Code Status:   Code Status: Full Code Family Communication: None at bedside Disposition Plan: Discharge home vs SNF pending orthopedic surgery recommendations/management, PT/OT recommendations.  Will likely need SNF.   Consultants:  Orthopedic surgery Gastroenterology  Procedures:   None  Antimicrobials: None    Subjective: No bowel  movement.   Objective: Vitals:   04/30/21 1326 04/30/21 1946 04/30/21 2205 05/01/21 0328  BP: (!) 124/53 (!) 106/43 (!) 118/54 (!) 102/43  Pulse: 63 68 67 67  Resp: 20 20  20   Temp: 98.2 F (36.8 C) 98.8 F (37.1 C)  98.2 F (36.8 C)  TempSrc: Oral Oral  Oral  SpO2: 99% 96%  98%  Weight:      Height:        Intake/Output Summary (Last 24 hours) at 05/01/2021 1220 Last data filed at 05/01/2021 0900 Gross per 24 hour  Intake 780 ml  Output 720 ml  Net 60 ml   Filed Weights   04/28/21 1518  Weight: 134.7 kg    Examination:  General exam: Appears calm and comfortable Respiratory system: Clear to auscultation. Respiratory effort normal. Cardiovascular system: S1 & S2 heard, RRR. No murmurs, rubs, gallops or clicks. Gastrointestinal system: Morbidly obese but abdomen seems nondistended, soft and nontender. No organomegaly or masses felt. Normal bowel sounds heard. Central nervous system: Alert and oriented. No focal neurological deficits. Musculoskeletal: Some left foot edema located dorsally. No calf tenderness Skin: No cyanosis. No rashes Psychiatry: Judgement and insight appear normal. Mood & affect appropriate.     Data Reviewed: I have personally reviewed following labs and imaging studies  CBC Lab Results  Component Value Date   WBC 4.9 04/30/2021   RBC 2.55 (L) 04/30/2021   HGB 7.5 (L) 04/30/2021   HCT 24.7 (L) 04/30/2021   MCV 96.9 04/30/2021   MCH 29.4 04/30/2021   PLT 92 (L) 04/30/2021   MCHC 30.4 04/30/2021   RDW 20.1 (H) 04/30/2021   LYMPHSABS 1.2 04/28/2021   MONOABS 1.0 04/28/2021   EOSABS 0.3 04/28/2021   BASOSABS 0.0 04/28/2021     Last metabolic panel Lab Results  Component Value Date   NA 133 (L) 04/30/2021   K 4.0 04/30/2021   CL 104 04/30/2021   CO2 21 (L) 04/30/2021   BUN 29 (H) 04/30/2021   CREATININE 1.53 (H) 04/30/2021   GLUCOSE 103 (H) 04/30/2021   GFRNONAA 35 (L)  04/30/2021   GFRAA 80 (L) 08/05/2013   CALCIUM 7.7 (L) 04/30/2021   PHOS 3.9 04/28/2021   PROT 5.8 (L) 04/30/2021   ALBUMIN 2.5 (L) 04/30/2021   BILITOT 0.4 04/30/2021   ALKPHOS 240 (H) 04/30/2021   AST 18 04/30/2021   ALT 12 04/30/2021   ANIONGAP 8 04/30/2021    CBG (last 3)  No results for input(s): GLUCAP in the last 72 hours.   GFR: Estimated Creatinine Clearance: 44.8 mL/min (A) (by C-G formula based on SCr of 1.53 mg/dL (H)).  Coagulation Profile: Recent Labs  Lab 04/28/21 1540  INR 1.5*    Recent Results (from the past 240 hour(s))  Resp Panel by RT-PCR (Flu A&B, Covid) Nasopharyngeal Swab     Status: None   Collection Time: 04/28/21  7:21 PM   Specimen: Nasopharyngeal Swab; Nasopharyngeal(NP) swabs in vial transport medium  Result Value Ref Range Status   SARS Coronavirus 2 by RT PCR NEGATIVE NEGATIVE Final    Comment: (NOTE) SARS-CoV-2 target nucleic acids are NOT DETECTED.  The SARS-CoV-2 RNA is generally detectable in upper respiratory specimens during the acute phase of infection. The lowest concentration of SARS-CoV-2 viral copies this assay can detect is 138 copies/mL. A negative result does not preclude SARS-Cov-2 infection and should not be used as the sole basis for treatment or other patient management decisions. A negative result may occur with  improper specimen collection/handling, submission of specimen other than nasopharyngeal swab, presence of viral mutation(s) within the areas targeted by this assay, and inadequate number of viral copies(<138 copies/mL). A negative result must be combined with clinical observations, patient history, and epidemiological information. The expected result is Negative.  Fact Sheet for Patients:  BloggerCourse.com  Fact Sheet for Healthcare Providers:  SeriousBroker.it  This test is no t yet approved or cleared by the [place] FDA and  has been authorized  for detection and/or diagnosis of SARS-CoV-2 by FDA under an Emergency Use Authorization (EUA). This EUA will remain  in effect (meaning this test can be used) for the duration of the COVID-19 declaration under Section 564(b)(1) of the Act, 21 U.S.C.section 360bbb-3(b)(1), unless the authorization is terminated  or revoked sooner.       Influenza A by PCR NEGATIVE NEGATIVE Final   Influenza B by PCR NEGATIVE NEGATIVE Final    Comment: (NOTE) The Xpert Xpress SARS-CoV-2/FLU/RSV plus assay is intended as an aid in the diagnosis of influenza from Nasopharyngeal swab specimens and should not be used as a sole basis for treatment. Nasal washings and aspirates are unacceptable for Xpert Xpress SARS-CoV-2/FLU/RSV testing.  Fact Sheet for Patients: BloggerCourse.com  Fact Sheet for Healthcare Providers: SeriousBroker.it  This test is not yet approved or cleared by the [place] FDA and has been authorized for detection and/or diagnosis of SARS-CoV-2 by FDA under an Emergency Use Authorization (EUA). This EUA will remain in effect (meaning this test can be used) for the duration of the COVID-19 declaration under Section 564(b)(1) of the Act, 21 U.S.C. section 360bbb-3(b)(1), unless the authorization is terminated or revoked.  Performed at [facility], [address], [place], [place] [address]          Radiology Studies: No results found.      Scheduled Meds:  busPIRone  15 mg Oral BID   calcium carbonate  1 tablet Oral TID   diltiazem  180 mg Oral Daily   influenza vaccine adjuvanted  0.5 mL Intramuscular Tomorrow-1000   levothyroxine  112 mcg Oral Q0600   multivitamin with minerals  1 tablet Oral Daily   Ensure Max Protein  11 oz Oral BID   sertraline  50 mg Oral Daily   Continuous Infusions:  sodium chloride 75 mL/hr at 04/30/21 0313   methocarbamol (ROBAXIN) IV       LOS: 3 days     Marcellus Scott, MD, [place], [facility]  To contact the attending provider between 7A-7P or the covering provider during after hours 7P-7A, please log into the web site [email or web address] and access using universal [place] password for that web site. If you do not have the password, please call the hospital operator.

## 2021-05-01 NOTE — Care Management Important Message (Signed)
Important Message  Patient Details IM Letter given to the Patient. Name: Sally Wise MRN: [id number] Date of Birth: 01-Nov-1945   Medicare Important Message Given:  Yes     [name] 05/01/2021, 10:24 AM

## 2021-05-01 NOTE — Consult Note (Signed)
Cardiology Consultation:   Patient ID: LISSETTE SCHENK MRN: [id number]; DOB: 29-Oct-1945  Admit date: 04/28/2021 Date of Consult: 05/01/2021  PCP:  [name], MD   [place] Prov[facility] Cardiologist:  Jenkins Rouge, MD   Patient Profile:   Sally Wise is a 75 y.o. female with a hx of PAF, chronic anemia, thrombocytopenia, morbid obesity, and hypothyroidism who is being seen 05/01/2021 for the evaluation of preop evaluatioin at the request of Dr. Algis Liming.  History of Present Illness:   Sally Wise follows with Dr. Johnsie Cancel for infrequent PAF. Sally Wise has had episodes of PAF that last less than 2 hrs and occurred in 2014 and 2015. Sally Wise is stable on cardizem, 81 mg ASA, and pill-in-the-pocket flecainide. Sally Wise is aware of her Afib. Through shared decision making with Dr. Johnsie Cancel, since Sally Wise has had infrequent episodes and is aware of her Afib, Sally Wise is not anticoagulated. Nuclear stress test in 07/2015 was negative for ischemia, consistent with diaphragmatic attenuation. Echo Nov 2021 showed LVEF 55-60%, grade 1 DD, no RWMA, and no significant valvular disease, but dilated IVC.  CT chest in 07/2020 showed aortic atherosclerosis and "foci of coronary calcification." Sally Wise  was last seen by Dr. Johnsie Cancel in Jan 2022 and was doing well at that time.   Sally Wise suffered a fall with left ankle fracture. We are asked to evaluate for preop clearance.    Hb 7.5 PLT 92 sCr 1.53 Hs troponin 5 --> 5  EKG with new inferior Q waves - compare to EKGs in 2019 and 2018   Past Medical History:  Diagnosis Date   Hematuria    evaluation with Dr. Joelyn Oms   Hypothyroidism 06/2010   Kidney stone 06/2481   Lichen sclerosus et atrophicus 2/13   biopsy proven   LVH (left ventricular hypertrophy)    Morbid obesity (HCC)    PAF (paroxysmal atrial fibrillation) ([place])    a. Remote hx, re-established care 01/2013 with Dr. Johnsie Cancel for recurrence. 2D echo 01/2013: mod LVH, EF 55-65%, no RWMA, grade 1 d/d. b. 06/2014:  started on Xarelto.    Thrombocytopenia ([place])    Vitamin D deficiency 06/2010   Wears glasses     Past Surgical History:  Procedure Laterality Date   BREAST EXCISIONAL BIOPSY  2010   left--was a vascular lesion   CARPAL TUNNEL RELEASE Left 08/09/2013   Procedure: LEFT CARPAL TUNNEL RELEASE;  Surgeon: Cammie Sickle., MD;  Location: [facility];  Service: Orthopedics;  Laterality: Left;   CATARACT EXTRACTION     COLONOSCOPY  04/2003       DILATION AND CURETTAGE OF UTERUS     HYSTEROSCOPY WITH D & C  8/99   REPLACEMENT TOTAL KNEE Bilateral 12/2006  07/2007    TONSILLECTOMY     TRIGGER FINGER RELEASE Left 08/09/2013   Procedure: LEFT A1/A2 PULLEY RELEASE;  Surgeon: Cammie Sickle., MD;  Location: [facility];  Service: Orthopedics;  Laterality: Left;   TUBAL LIGATION  5/77     Home Medications:  Prior to Admission medications   Medication Sig Start Date End Date Taking? Authorizing Provider  busPIRone (BUSPAR) 15 MG tablet Take 15 mg by mouth in the morning and at bedtime. 02/24/21  Yes [provider]  Cholecalciferol (VITAMIN D3) 5000 UNITS TABS Take 1 tablet by mouth daily.   Yes [provider]  diltiazem (CARDIZEM CD) 180 MG 24 hr capsule TAKE 1 CAPSULE BY MOUTH EVERY DAY Patient taking differently: Take 180  mg by mouth in the morning. 08/31/20  Yes Josue Hector, MD  EUTHYROX 112 MCG tablet Take 112 mcg by mouth daily before breakfast.   Yes [provider]  folic acid (FOLVITE) 1 MG tablet Take 1 tablet (1 mg total) by mouth daily. 03/17/21  Yes Mercy Riding, MD  loperamide (IMODIUM) 2 MG capsule Take 1 capsule (2 mg total) by mouth every 4 (four) hours as needed for diarrhea or loose stools. 03/17/21  Yes Mercy Riding, MD  sertraline (ZOLOFT) 50 MG tablet Take 50 mg by mouth in the morning. 02/21/21  Yes [provider]  flecainide (TAMBOCOR) 150 MG tablet Take 2 tablets (300 mg total) by mouth daily as needed  (AFib episode). Patient not taking: No sig reported 05/08/17   Josue Hector, MD    Inpatient Medications: Scheduled Meds:  sodium chloride   Intravenous Once   busPIRone  15 mg Oral BID   calcium carbonate  1 tablet Oral TID   diltiazem  180 mg Oral Daily   influenza vaccine adjuvanted  0.5 mL Intramuscular Tomorrow-1000   levothyroxine  112 mcg Oral Q0600   multivitamin with minerals  1 tablet Oral Daily   Ensure Max Protein  11 oz Oral BID   sertraline  50 mg Oral Daily   Continuous Infusions:  methocarbamol (ROBAXIN) IV     PRN Meds: HYDROcodone-acetaminophen, methocarbamol **OR** methocarbamol (ROBAXIN) IV  Allergies:   No Known Allergies  Social History:   Social History   Socioeconomic History   Marital status: Married    Spouse name: Not on file   Number of children: Not on file   Years of education: Not on file   Highest education level: Not on file  Occupational History   Not on file  Tobacco Use   Smoking status: Never   Smokeless tobacco: Never  Vaping Use   Vaping Use: Never used  Substance and Sexual Activity   Alcohol use: No   Drug use: No   Sexual activity: Yes    Partners: Male    Birth control/protection: Surgical  Other Topics Concern   Not on file  Social History Narrative   Not on file   Social Determinants of Health   Financial Resource Strain: Not on file  Food Insecurity: Not on file  Transportation Needs: Not on file  Physical Activity: Not on file  Stress: Not on file  Social Connections: Not on file  Intimate Partner Violence: Not on file    Family History:    Family History  Problem Relation Age of Onset   Hypertension Mother    Kidney disease Mother    Alzheimer's disease Father    Diabetes Brother    Hypertension Brother    Spina bifida Daughter      ROS:  Please see the history of present illness.   All other ROS reviewed and negative.     Physical Exam/Data:   Vitals:   04/30/21 1946 04/30/21 2205  05/01/21 0328 05/01/21 1243  BP: (!) 106/43 (!) 118/54 (!) 102/43 (!) 122/53  Pulse: 68 67 67 62  Resp: 20  20 20   Temp: 98.8 F (37.1 C)  98.2 F (36.8 C) 98.2 F (36.8 C)  TempSrc: Oral  Oral Oral  SpO2: 96%  98% 95%  Weight:      Height:        Intake/Output Summary (Last 24 hours) at 05/01/2021 1300 Last data filed at 05/01/2021 1100 Gross per 24  hour  Intake 900 ml  Output 720 ml  Net 180 ml   Last 3 Weights 04/28/2021 03/16/2021 03/14/2021  Weight (lbs) 297 lb 296 lb 11.8 oz 295 lb 13.7 oz  Weight (kg) 134.718 kg 134.6 kg 134.2 kg     Body mass index is 45.16 kg/m.  General:  elderly , moderately obese female.  HEENT: normal Neck: no JVD Vascular: No carotid bruits; Distal pulses 2+ bilaterally Cardiac:  normal S1, S2; RRR; no murmur  Lungs:  clear to auscultation bilaterally, no wheezing, rhonchi or rales  Abd: soft, nontender, no hepatomegaly  Ext: no edema Musculoskeletal:  No deformities, BUE and BLE strength normal and equal Skin: warm and dry  Neuro:  CNs 2-12 intact, no focal abnormalities noted Psych:  Normal affect   EKG:  The EKG was personally reviewed and demonstrates:  NSR , HR is well controlled.   NO ST / T wave changes.  Telemetry:  Telemetry was personally reviewed and demonstrates:   NSR    Relevant CV Studies:  Echo 05/2020: 1. Left ventricular ejection fraction, by estimation, is 55 to 60%. The  left ventricle has normal function. The left ventricle has no regional  wall motion abnormalities. Left ventricular diastolic parameters are  consistent with Grade I diastolic  dysfunction (impaired relaxation).   2. Right ventricular systolic function is normal. The right ventricular  size is normal. Tricuspid regurgitation signal is inadequate for assessing  PA pressure.   3. The mitral valve is grossly normal. Trivial mitral valve  regurgitation.   4. The aortic valve is tricuspid. Aortic valve regurgitation is not  visualized.   5. The  inferior vena cava is dilated in size with <50% respiratory  variability, suggesting right atrial pressure of 15 mmHg.  Laboratory Data:  High Sensitivity Troponin:   Recent Labs  Lab 04/28/21 1540 04/28/21 1915  TROPONINIHS 5 5     Chemistry Recent Labs  Lab 04/28/21 1540 04/28/21 1915 04/29/21 0518 04/30/21 1438  NA 135  --  136 133*  K 4.3  --  3.7 4.0  CL 105  --  106 104  CO2 24  --  23 21*  GLUCOSE 113*  --  86 103*  BUN 21  --  26* 29*  CREATININE 1.36*  --  1.56* 1.53*  CALCIUM 8.1*  --  7.9* 7.7*  MG  --  2.0  --   --   GFRNONAA 40*  --  34* 35*  ANIONGAP 6  --  7 8    Recent Labs  Lab 04/28/21 1540 04/30/21 1438  PROT 6.4* 5.8*  ALBUMIN 2.9* 2.5*  AST 11* 18  ALT 11 12  ALKPHOS 269* 240*  BILITOT 0.8 0.4   Lipids No results for input(s): CHOL, TRIG, HDL, LABVLDL, LDLCALC, CHOLHDL in the last 168 hours.  Hematology Recent Labs  Lab 04/28/21 1540 04/29/21 0518 04/30/21 0446  WBC 6.1 5.4 4.9  RBC 3.18* 2.60*  2.57* 2.55*  HGB 9.3* 7.7* 7.5*  HCT 30.0* 24.7* 24.7*  MCV 94.3 95.0 96.9  MCH 29.2 29.6 29.4  MCHC 31.0 31.2 30.4  RDW 19.9* 19.7* 20.1*  PLT 101* 85* 92*   Thyroid  Recent Labs  Lab 04/29/21 0518  TSH 3.944    BNPNo results for input(s): BNP, PROBNP in the last 168 hours.  DDimer No results for input(s): DDIMER in the last 168 hours.   Radiology/Studies:  DG Ankle Complete Left  Result Date: 04/28/2021 CLINICAL DATA:  Fall,  injury, deformity EXAM: LEFT ANKLE COMPLETE - 3+ VIEW COMPARISON:  None. FINDINGS: There is fracture dislocation at the left ankle. Fracture through the medial malleolus and distal fibula. The talus is dislocated laterally relative to the tibia. Probable posterior malleolar fracture as well. IMPRESSION: Trimalleolar fracture dislocation. Electronically Signed   By: Rolm Baptise M.D.   On: 04/28/2021 16:05   US Abdomen Complete  Result Date: 04/28/2021 CLINICAL DATA:  Elevated alk-phos, AK I. EXAM:  ABDOMEN ULTRASOUND COMPLETE COMPARISON:  CT March 15, 2021 FINDINGS: Gallbladder: Cholelithiasis measuring up to 2.1 cm. Gallbladder wall cholesterolosis. No gallbladder wall thickening visualized. No sonographic Murphy sign noted by sonographer. Common bile duct: Diameter: 5 mm Liver: No focal lesion identified. Diffusely increased parenchymal echogenicity. Portal vein is patent on color Doppler imaging with normal direction of blood flow towards the liver. IVC: No abnormality visualized. Pancreas: Visualized portion unremarkable. Spleen: Size and appearance within normal limits. Right Kidney: Length: 10.1 cm. Cortical thinning. No mass or hydronephrosis visualized. Left Kidney: Length: 9.4 cm. Cortical thinning. No mass or hydronephrosis visualized. Abdominal aorta: No aneurysm visualized. Other findings: None. IMPRESSION: 1. Cholelithiasis without sonographic evidence of acute cholecystitis. 2. The echogenicity of the liver is increased. This is a nonspecific finding but is most commonly seen with fatty infiltration of the liver. There are no obvious focal liver lesions. 3. Bilateral renal cortical thinning as can be seen with medical renal disease. 4. Small volume perihepatic and right perinephric free fluid. Electronically Signed   By: Dahlia Bailiff M.D.   On: 04/28/2021 23:25   CT ANKLE LEFT WO CONTRAST  Result Date: 04/29/2021 CLINICAL DATA:  Fracture, ankle rule out posterior malleolus fracture; for preop planning EXAM: CT OF THE LEFT ANKLE WITHOUT CONTRAST TECHNIQUE: Multidetector CT imaging of the left ankle was performed according to the standard protocol. Multiplanar CT image reconstructions were also generated. COMPARISON:  Same day radiograph FINDINGS: Bones/Joint/Cartilage There is a trimalleolar fracture with lateral subluxation of the talus with respect to the tibia. Medial malleolar fracture is displaced up to 9 mm. The oblique lateral malleolar fracture is posterolaterally displaced up to  6 mm. There is a posterior malleolar fracture which involves approximately 25% of the articular surface. The posterior malleolar fragment is displaced up to 3 mm. Diffuse osteopenia. Small bony fragments along the dorsal aspect of the talar head, suggesting dorsal capsular avulsion injury. Plantar calcaneal spurring. Partially visualized osteoarthritis in the tarsometatarsal joints. Mild talonavicular osteoarthritis. Ligaments Suboptimally assessed by CT. Muscles and Tendons No evidence of tendon entrapment.  Mild muscle atrophy. Soft tissues Extensive ankle soft tissue swelling.  No focal fluid collection. IMPRESSION: Trimalleolar ankle fracture with lateral subluxation of the talus with respect to the tibia and mild fracture displacement as described above. The posterior malleolar fracture involves approximately 25% of the articular surface. Tiny bony fragments along the dorsal aspect of the talar head consistent with dorsal capsular avulsion fracture. Diffuse osteopenia. Electronically Signed   By: Maurine Simmering M.D.   On: 04/29/2021 10:55   DG Chest Port 1 View  Result Date: 04/28/2021 CLINICAL DATA:  Fall EXAM: PORTABLE CHEST 1 VIEW COMPARISON:  06/05/2020 FINDINGS: Cardiomegaly. Lungs clear. No effusions or edema. No acute bony abnormality. IMPRESSION: Cardiomegaly.  No active disease. Electronically Signed   By: Rolm Baptise M.D.   On: 04/28/2021 16:05   DG Ankle Left Port  Result Date: 04/28/2021 CLINICAL DATA:  Postreduction EXAM: PORTABLE LEFT ANKLE - 2 VIEW COMPARISON:  04/28/2021 FINDINGS: Interval reduction of the  previously seen dislocated left ankle. Improved alignment of the medial malleolar and lateral malleolar fractures with continued mild displacement. Small fracture fragment at the posterior corner of the tibia on the lateral view. IMPRESSION: Interval reduction of the dislocation. Improved alignment of the trimalleolar fracture. Electronically Signed   By: Rolm Baptise M.D.   On:  04/28/2021 19:16     Assessment and Plan:   PAF - continue cardizem - continue ASA if able - not anticoagulated given infrequent episodes and  CHA2DS2-VASc Score and unadjusted Ischemic Stroke Rate (% per year) is equal to 3.2 % stroke rate/year from a score of 3 (2age, female)   Preoperative risk evaluation for MACE According to the RCRI, Sally Wise is low risk for cardiac complications. Sally Wise is unable to complete 4.0 METS, but does not have any unstable cardiac issues.    Signed, Rachel, PA  05/01/2021 1:00 PM   Attending Note:   The patient was seen and examined.  Agree with assessment and plan as noted above.  Changes made to the above note as needed.  Patient seen and independently examined with Doreene Adas, PA .   We discussed all aspects of the encounter. I agree with the assessment and plan as stated above.   1.  Paroxysmal atrial fibrillation: Sally Wise has a history of paroxysmal atrial fibrillation.  These episodes only last for an hour or so and Sally Wise can tell right away when Sally Wise goes into A. fib.  Through shared decision-making with Dr. Johnsie Cancel.   They have decided to not treat her with anticoagulation at this time since her A. fib burden is so  low.  Sally Wise is currently in sinus rhythm.  2.  Chronic diastolic congestive heart failure.  Sally Wise has normal left ventricular systolic function.  Sally Wise has mild, grade 1 diastolic dysfunction.  Volume status appears to be normal at present.  Sally Wise is at low risk for her upcoming ankle surgery.  Apparently the surgery is not scheduled until next Monday. Further preoperative assessment and management per the [facility] internal medicine team.  Cardiology team will sign off.  Sally Wise may return to see Dr. Johnsie Cancel at her previously scheduled appointment. Please call for any cardiac questions.   For questions or updates, please contact [facility] Please consult [email or web address] for contact info under    I have spent a total of 40 minutes with patient  reviewing hospital  notes , telemetry, EKGs, labs and examining patient as well as establishing an assessment and plan that was discussed with the patient.  > 50% of time was spent in direct patient care.     Thayer Headings, Brooke Bonito., MD, [facility] 05/01/2021, 1:55 PM [address]  [place] Pager [phone]

## 2021-05-02 ENCOUNTER — Encounter: Payer: Medicare Other | Admitting: Physical Therapy

## 2021-05-02 DIAGNOSIS — D62 Acute posthemorrhagic anemia: Secondary | ICD-10-CM | POA: Diagnosis not present

## 2021-05-02 DIAGNOSIS — N179 Acute kidney failure, unspecified: Secondary | ICD-10-CM | POA: Diagnosis not present

## 2021-05-02 LAB — CBC
HCT: 28.7 % — ABNORMAL LOW (ref 36.0–46.0)
Hemoglobin: 8.9 g/dL — ABNORMAL LOW (ref 12.0–15.0)
MCH: 29.2 pg (ref 26.0–34.0)
MCHC: 31 g/dL (ref 30.0–36.0)
MCV: 94.1 fL (ref 80.0–100.0)
Platelets: 88 10*3/uL — ABNORMAL LOW (ref 150–400)
RBC: 3.05 MIL/uL — ABNORMAL LOW (ref 3.87–5.11)
RDW: 19.5 % — ABNORMAL HIGH (ref 11.5–15.5)
WBC: 6.5 10*3/uL (ref 4.0–10.5)
nRBC: 0.3 % — ABNORMAL HIGH (ref 0.0–0.2)

## 2021-05-02 LAB — TYPE AND SCREEN
ABO/RH(D): A POS
Antibody Screen: NEGATIVE
Unit division: 0

## 2021-05-02 LAB — MAGNESIUM: Magnesium: 2.1 mg/dL (ref 1.7–2.4)

## 2021-05-02 LAB — BPAM RBC
Blood Product Expiration Date: 202211132359
ISSUE DATE / TIME: 202210191609
Unit Type and Rh: 6200

## 2021-05-02 LAB — BASIC METABOLIC PANEL WITH GFR
Anion gap: 5 (ref 5–15)
BUN: 27 mg/dL — ABNORMAL HIGH (ref 8–23)
CO2: 24 mmol/L (ref 22–32)
Calcium: 8 mg/dL — ABNORMAL LOW (ref 8.9–10.3)
Chloride: 104 mmol/L (ref 98–111)
Creatinine, Ser: 1.21 mg/dL — ABNORMAL HIGH (ref 0.44–1.00)
GFR, Estimated: 46 mL/min — ABNORMAL LOW
Glucose, Bld: 93 mg/dL (ref 70–99)
Potassium: 4.6 mmol/L (ref 3.5–5.1)
Sodium: 133 mmol/L — ABNORMAL LOW (ref 135–145)

## 2021-05-02 MED ORDER — MELATONIN 5 MG PO TABS
5.0000 mg | ORAL_TABLET | Freq: Once | ORAL | Status: AC
  Administered 2021-05-02: 5 mg via ORAL
  Filled 2021-05-02: qty 1

## 2021-05-02 NOTE — Progress Notes (Signed)
PROGRESS NOTE    Sally Wise  JJO:[id number] DOB: August 27, 1945 DOA: 04/28/2021 PCP: Daisy Floro, MD   Brief Narrative: Sally Wise is a 75 y.o. female with a history of atrial fibrillation, hypothyroidism, depression, chronic diastolic heart failure, mucoid diarrhea. Patient presented after suffering a fall and found to have a left ankle fracture. Orthopedic surgery consulted and plan surgery on 10/24 pending improvement of swelling around the ankle fracture and medical optimization.   Assessment & Plan:   Active Problems:   Hypothyroidism   Morbid obesity (HCC)   PAF (paroxysmal atrial fibrillation) (HCC)   Thrombocytopenia (HCC)   Mucoid diarrhea   Ankle fracture   Chronic diastolic CHF (congestive heart failure) (HCC)   Anemia   Prolonged QT interval   Left ankle fracture Secondary to fall. Splint placed.  -Orthopedic surgery recommendations appreciated: Plan for left ankle ORIF versus possible external fixation of complex trimalleolar ankle fracture on 10/24 at 11:30 AM.  Delay in surgery so that there is improvement in the soft tissue edema around the fracture.  Also needs medical optimization i.e. improvement in her anemia. - Cardiology input appreciated and indicate low preoperative cardiac risk for upcoming ankle surgery.  No further evaluation planned.  Patient is medically optimized for surgery at this point. - Pharmacy communicated with patient's primary hematologist and initiated weight-based Lovenox for DVT prophylaxis-to be held on day of surgery.  AKI Baseline creatinine of about 1. Creatinine of 1.36 on admission, up to 1.56. Overall seems euvolemic.  -Renal ultrasound negative for hydronephrosis. - Clinically appears euvolemic, DC IV fluids. - Not on any nephrotoxic's medication at this time. -Creatinine had improved from 1.53-1.15 yesterday.  Slightly up at 1.21 today.  Follow BMP in AM.  Paroxysmal atrial fibrillation Not on anticoagulation,  unclear etiology.. CHA2DS2-VASc Score is 4. On diltiazem as an outpatient. -Continue diltiazem  Mucoid diarrhea Chronic issue although patient clarifies that she isn't actually having diarrhea.  GI panel PCR positive for enteropathogenic E. coli a month ago. -GI recommendations (Signed off 10/18): GI pathogen panel, consider probiotics-continue align, outpatient follow-up - Reportedly had sigmoidoscopy with Dr. Matthias Hughs recently. - No diarrhea.  In fact she did not have a BM for almost 3 days and had a formed BM today.  Discontinued contact isolation.  Hypothyroidism Patient is on Euthyrox as an outpatient -Continue Synthroid  Chronic diastolic heart failure Appears euvolemic. Not on lasix as an outpatient.  Thrombocytopenia Chronic. Patient follows with hematology.  Acute on chronic anemia Patient follows with hematology. Acute anemia of unknown etiology. No obvious evidence of bleeding -FOBT - Unclear etiology.  Baseline hemoglobin appears to be in the 9-10 g range.  Currently stable in the mid 7 g range.?  Related to fracture and internal blood loss. Patient symptomatic with generalized weakness and malaise. -Hemoglobin is appropriately improved from 7.5-8.9.  Follow CBC in AM.  Elevated INR Unsure of etiology. AST/ALT normal. ALP elevated. No obvious bleeding.  Elevated alkaline phosphatase Unsure of etiology. GGT normal. Possible bone etiology but no obvious source. -CMP pending  Prolonged QTc Noted.  Potassium was 4 and magnesium 2 recently.  Follow-up EKG periodically and minimize use of QT prolonging medications.  Will order EKG today.  Morbid obesity Body mass index is 45.16 kg/m.   DVT prophylaxis: SCDs Code Status:   Code Status: Full Code Family Communication: None at bedside Disposition Plan: Discharge home vs SNF pending orthopedic surgery recommendations/management, PT/OT recommendations.  Will likely need SNF.   Consultants:  Orthopedic  surgery Gastroenterology Cardiology  Procedures:  None  Antimicrobials: None    Subjective: Left ankle pain not much since this morning.  Reportedly has not received pain meds since this morning.  Had a formed BM today.  "I do not know what you are talking about, I do not have C. difficile".  Objective: Vitals:   05/01/21 1635 05/01/21 1955 05/02/21 0349 05/02/21 1358  BP:  (!) 114/50 (!) 158/80 (!) 135/57  Pulse: 63 65 62 68  Resp: 14 20 20 16   Temp: 98.3 F (36.8 C) 99.6 F (37.6 C) 97.9 F (36.6 C) 98.3 F (36.8 C)  TempSrc: Oral Oral Oral Oral  SpO2: 100% 95% 97% 94%  Weight:      Height:        Intake/Output Summary (Last 24 hours) at 05/02/2021 1401 Last data filed at 05/02/2021 0350 Gross per 24 hour  Intake 1080 ml  Output 1350 ml  Net -270 ml   Filed Weights   04/28/21 1518  Weight: 134.7 kg    Examination:  General exam: Elderly female, moderately built and obese lying comfortably propped up in bed without distress. Respiratory system: Clear to auscultation.  No increased work of breathing. Cardiovascular system: S1 & S2 heard, RRR. No murmurs, rubs, gallops or clicks. Gastrointestinal system: Mildly obese abdomen but nondistended, soft and nontender.  No organomegaly or masses appreciated. Central nervous system: Alert and oriented. No focal neurological deficits. Musculoskeletal: Left lower extremity splint intact.  Able to wiggle toes.  Color appears normal. Skin: No cyanosis. No rashes Psychiatry: Judgement and insight appear normal. Mood & affect appropriate.     Data Reviewed: I have personally reviewed following labs and imaging studies  CBC Lab Results  Component Value Date   WBC 6.5 05/02/2021   RBC 3.05 (L) 05/02/2021   HGB 8.9 (L) 05/02/2021   HCT 28.7 (L) 05/02/2021   MCV 94.1 05/02/2021   MCH 29.2 05/02/2021   PLT 88 (L) 05/02/2021   MCHC 31.0 05/02/2021   RDW 19.5 (H) 05/02/2021   LYMPHSABS 1.2 04/28/2021   MONOABS 1.0  04/28/2021   EOSABS 0.3 04/28/2021   BASOSABS 0.0 04/28/2021     Last metabolic panel Lab Results  Component Value Date   NA 133 (L) 05/02/2021   K 4.6 05/02/2021   CL 104 05/02/2021   CO2 24 05/02/2021   BUN 27 (H) 05/02/2021   CREATININE 1.21 (H) 05/02/2021   GLUCOSE 93 05/02/2021   GFRNONAA 46 (L) 05/02/2021   GFRAA 80 (L) 08/05/2013   CALCIUM 8.0 (L) 05/02/2021   PHOS 3.9 04/28/2021   PROT 5.8 (L) 04/30/2021   ALBUMIN 2.5 (L) 04/30/2021   BILITOT 0.4 04/30/2021   ALKPHOS 240 (H) 04/30/2021   AST 18 04/30/2021   ALT 12 04/30/2021   ANIONGAP 5 05/02/2021    CBG (last 3)  No results for input(s): GLUCAP in the last 72 hours.   GFR: Estimated Creatinine Clearance: 56.7 mL/min (A) (by C-G formula based on SCr of 1.21 mg/dL (H)).  Coagulation Profile: Recent Labs  Lab 04/28/21 1540  INR 1.5*    Recent Results (from the past 240 hour(s))  Resp Panel by RT-PCR (Flu A&B, Covid) Nasopharyngeal Swab     Status: None   Collection Time: 04/28/21  7:21 PM   Specimen: Nasopharyngeal Swab; Nasopharyngeal(NP) swabs in vial transport medium  Result Value Ref Range Status   SARS Coronavirus 2 by RT PCR NEGATIVE NEGATIVE Final    Comment: (NOTE) SARS-CoV-2  target nucleic acids are NOT DETECTED.  The SARS-CoV-2 RNA is generally detectable in upper respiratory specimens during the acute phase of infection. The lowest concentration of SARS-CoV-2 viral copies this assay can detect is 138 copies/mL. A negative result does not preclude SARS-Cov-2 infection and should not be used as the sole basis for treatment or other patient management decisions. A negative result may occur with  improper specimen collection/handling, submission of specimen other than nasopharyngeal swab, presence of viral mutation(s) within the areas targeted by this assay, and inadequate number of viral copies(<138 copies/mL). A negative result must be combined with clinical observations, patient history,  and epidemiological information. The expected result is Negative.  Fact Sheet for Patients:  BloggerCourse.com  Fact Sheet for Healthcare Providers:  SeriousBroker.it  This test is no t yet approved or cleared by the [place] FDA and  has been authorized for detection and/or diagnosis of SARS-CoV-2 by FDA under an Emergency Use Authorization (EUA). This EUA will remain  in effect (meaning this test can be used) for the duration of the COVID-19 declaration under Section 564(b)(1) of the Act, 21 U.S.C.section 360bbb-3(b)(1), unless the authorization is terminated  or revoked sooner.       Influenza A by PCR NEGATIVE NEGATIVE Final   Influenza B by PCR NEGATIVE NEGATIVE Final    Comment: (NOTE) The Xpert Xpress SARS-CoV-2/FLU/RSV plus assay is intended as an aid in the diagnosis of influenza from Nasopharyngeal swab specimens and should not be used as a sole basis for treatment. Nasal washings and aspirates are unacceptable for Xpert Xpress SARS-CoV-2/FLU/RSV testing.  Fact Sheet for Patients: BloggerCourse.com  Fact Sheet for Healthcare Providers: SeriousBroker.it  This test is not yet approved or cleared by the [place] FDA and has been authorized for detection and/or diagnosis of SARS-CoV-2 by FDA under an Emergency Use Authorization (EUA). This EUA will remain in effect (meaning this test can be used) for the duration of the COVID-19 declaration under Section 564(b)(1) of the Act, 21 U.S.C. section 360bbb-3(b)(1), unless the authorization is terminated or revoked.  Performed at [facility], [address], [place], [place] [address]          Radiology Studies: US RENAL  Result Date: 05/01/2021 CLINICAL DATA:  Acute renal injury. EXAM: RENAL / URINARY TRACT ULTRASOUND COMPLETE COMPARISON:  CT abdomen and pelvis 03/15/2021. ultrasound  abdomen 04/28/2021. FINDINGS: Right Kidney: Renal measurements: 9.9 x 5.3 x 5.2 cm = volume: 144 mL. Echogenicity within normal limits. No mass or hydronephrosis visualized. Left Kidney: Renal measurements: 9.4 x 5.9 x 5.5 cm = volume: 160 mL. Echogenicity within normal limits. No mass or hydronephrosis visualized. Bladder: Appears normal for degree of bladder distention. Bilateral ureteral jets are visualized. Other: Suboptimal secondary to patient breathing and body habitus. IMPRESSION: 1. No hydronephrosis. Electronically Signed   By: Darliss Cheney M.D.   On: 05/01/2021 19:07        Scheduled Meds:  sodium chloride   Intravenous Once   busPIRone  15 mg Oral BID   diltiazem  180 mg Oral Daily   enoxaparin (LOVENOX) injection  60 mg Subcutaneous Q24H   influenza vaccine adjuvanted  0.5 mL Intramuscular Tomorrow-1000   levothyroxine  112 mcg Oral Q0600   multivitamin with minerals  1 tablet Oral Daily   Ensure Max Protein  11 oz Oral BID   sertraline  50 mg Oral Daily   Continuous Infusions:  methocarbamol (ROBAXIN) IV       LOS: 4 days  Vernell Leep, MD, [place]. [facility]  To contact the attending provider between 7A-7P or the covering provider during after hours 7P-7A, please log into the web site [email or web address] and access using universal [place] password for that web site. If you do not have the password, please call the hospital operator.

## 2021-05-03 DIAGNOSIS — N179 Acute kidney failure, unspecified: Secondary | ICD-10-CM | POA: Diagnosis not present

## 2021-05-03 DIAGNOSIS — D62 Acute posthemorrhagic anemia: Secondary | ICD-10-CM | POA: Diagnosis not present

## 2021-05-03 LAB — BASIC METABOLIC PANEL
Anion gap: 5 (ref 5–15)
BUN: 30 mg/dL — ABNORMAL HIGH (ref 8–23)
CO2: 24 mmol/L (ref 22–32)
Calcium: 7.9 mg/dL — ABNORMAL LOW (ref 8.9–10.3)
Chloride: 106 mmol/L (ref 98–111)
Creatinine, Ser: 1.14 mg/dL — ABNORMAL HIGH (ref 0.44–1.00)
GFR, Estimated: 50 mL/min — ABNORMAL LOW (ref >60)
Glucose, Bld: 99 mg/dL (ref 70–99)
Potassium: 4.6 mmol/L (ref 3.5–5.1)
Sodium: 135 mmol/L (ref 135–145)

## 2021-05-03 LAB — CBC
HCT: 27 % — ABNORMAL LOW (ref 36.0–46.0)
Hemoglobin: 8.4 g/dL — ABNORMAL LOW (ref 12.0–15.0)
MCH: 29.5 pg (ref 26.0–34.0)
MCHC: 31.1 g/dL (ref 30.0–36.0)
MCV: 94.7 fL (ref 80.0–100.0)
Platelets: 90 10*3/uL — ABNORMAL LOW (ref 150–400)
RBC: 2.85 MIL/uL — ABNORMAL LOW (ref 3.87–5.11)
RDW: 19.5 % — ABNORMAL HIGH (ref 11.5–15.5)
WBC: 6 10*3/uL (ref 4.0–10.5)
nRBC: 0.3 % — ABNORMAL HIGH (ref 0.0–0.2)

## 2021-05-03 NOTE — Progress Notes (Signed)
PROGRESS NOTE    Sally Wise  IHK:[id number] DOB: [birth year] DOA: 04/28/2021 PCP: Daisy Floro, MD   Brief Narrative: Sally Wise is a 75 y.o. female with a history of atrial fibrillation, hypothyroidism, depression, chronic diastolic heart failure, mucoid diarrhea. Patient presented after suffering a fall and found to have a left ankle fracture. Orthopedic surgery consulted and plan surgery on 10/24.  She has been medically optimized and Cardiology provided preop cardiac clearance is a low risk.  Communicated with her hematologist who indicates that her thrombocytopenia should not be a limitation for for her upcoming surgery and she is a low bleeding risk.   Assessment & Plan:   Active Problems:   Hypothyroidism   Morbid obesity (HCC)   PAF (paroxysmal atrial fibrillation) (HCC)   Thrombocytopenia (HCC)   Mucoid diarrhea   Ankle fracture   Chronic diastolic CHF (congestive heart failure) (HCC)   Anemia   Prolonged QT interval   Left ankle fracture Secondary to fall. Splint placed.  -Orthopedic surgery recommendations appreciated: Plan for left ankle ORIF versus possible external fixation of complex trimalleolar ankle fracture on 10/24 at 11:30 AM.  Delay in surgery so that there is improvement in the soft tissue edema around the fracture.   - She has been medically optimized and Cardiology provided preop cardiac clearance as a low risk.  Communicated with her hematologist who indicates that her thrombocytopenia should not be a limitation for for her upcoming surgery and she is a low bleeding risk. - Patient requested that orthopedics come by and look at her left lower extremity again today.  Communicated with orthopedics and they will see her later today.  AKI Baseline creatinine of about 1. Creatinine of 1.36 on admission, up to 1.56. Overall seems euvolemic.  -Renal ultrasound negative for hydronephrosis. - Clinically appears euvolemic, DC IV fluids. - Not on  any nephrotoxic's medication at this time. -Resolved.  Periodically follow BMP.  Paroxysmal atrial fibrillation Not on anticoagulation, unclear etiology but as per cardiology, this was a shared decision making between patient and her primary cardiologist. CHA2DS2-VASc Score is 4. On diltiazem as an outpatient. -Continue diltiazem  Mucoid diarrhea Chronic issue although patient clarifies that she isn't actually having diarrhea.  GI panel PCR positive for enteropathogenic E. coli a month ago. -GI recommendations (Signed off 10/18): GI pathogen panel, consider probiotics-continue align, outpatient follow-up - Reportedly had sigmoidoscopy with Dr. Matthias Hughs recently. - No diarrhea.  In fact she did not have a BM for almost 3 days and had a formed BM 10/20.  Discontinued contact isolation.  Hypothyroidism Patient is on Euthyrox as an outpatient -Continue Synthroid  Chronic diastolic heart failure Appears euvolemic. Not on lasix as an outpatient.  Thrombocytopenia Chronic. Patient follows with hematology.  As per my communication with Dr. Clelia Croft yesterday, may proceed with surgery and is at low bleeding risk.  Acute on chronic anemia Patient follows with hematology. Acute anemia of unknown etiology. No obvious evidence of bleeding -FOBT - Unclear etiology.  Baseline hemoglobin appears to be in the 9-10 g range.  Currently stable in the mid 7 g range.?  Related to fracture and internal blood loss. Patient symptomatic with generalized weakness and malaise. -Hemoglobin is appropriately improved from 7.5-8.9-8.4.  Follow CBC in AM.  Elevated INR Unsure of etiology. AST/ALT normal. ALP elevated. No obvious bleeding.  Elevated alkaline phosphatase Unsure of etiology. GGT normal. Possible bone etiology but no obvious source. -CMP pending  Prolonged QTc EKG 10/20 showed QTC of  407 ms.  Potassium and magnesium appropriate.  Functional quadriplegia Patient and family at bedside indicate that  patient has had significant lower extremity weakness for almost a year, worsened since her hospitalization in August 2022 and has since then been using a walker at home.  Has sustained multiple falls.  Seen by PCP and referred to neurology.  Has an upcoming appointment on Tuesday next week with Dr. Everlena Cooper, neurology and advised spouse to have it postponed since she is hospitalized.  She is concerned about neuropathy.  No history of DM.  B12: 3928 this admission. Therapies evaluation postop.  Likely will need SNF. Advised them that this may also be due to deconditioning.  Morbid obesity Body mass index is 45.16 kg/m.   DVT prophylaxis: SCDs Code Status:   Code Status: Full Code Family Communication: None at bedside Disposition Plan: Discharge home vs SNF pending orthopedic surgery recommendations/management, PT/OT recommendations.  Will likely need SNF.   Consultants:  Orthopedic surgery Gastroenterology Cardiology  Procedures:  None  Antimicrobials: None    Subjective: Mild left lower extremity pain.  Would like orthopedics to evaluate her limb today.  No other complaints reported.  Objective: Vitals:   05/02/21 1358 05/02/21 2100 05/03/21 0455 05/03/21 1415  BP: (!) 135/57 (!) 128/57 136/61 (!) 128/51  Pulse: 68 68 65 66  Resp: 16 18 18 18   Temp: 98.3 F (36.8 C) 98.3 F (36.8 C) (!) 97.1 F (36.2 C) 98.6 F (37 C)  TempSrc: Oral   Oral  SpO2: 94% 98% 97% 95%  Weight:      Height:       No intake or output data in the 24 hours ending 05/03/21 1447  Filed Weights   04/28/21 1518  Weight: 134.7 kg    Examination:  General exam: Elderly female, moderately built and obese lying comfortably propped up in bed without distress. Respiratory system: Clear to auscultation. Cardiovascular system: S1 & S2 heard, RRR. No murmurs, rubs, gallops or clicks. Gastrointestinal system: Mildly obese abdomen but nondistended, soft and nontender.  No organomegaly or masses  appreciated. Central nervous system: Alert and oriented. No focal neurological deficits. Musculoskeletal: Left lower extremity splint intact.  Able to wiggle her toes which look normal in color. Skin: No cyanosis. No rashes Psychiatry: Judgement and insight appear normal. Mood & affect appropriate.     Data Reviewed: I have personally reviewed following labs and imaging studies  CBC Lab Results  Component Value Date   WBC 6.0 05/03/2021   RBC 2.85 (L) 05/03/2021   HGB 8.4 (L) 05/03/2021   HCT 27.0 (L) 05/03/2021   MCV 94.7 05/03/2021   MCH 29.5 05/03/2021   PLT 90 (L) 05/03/2021   MCHC 31.1 05/03/2021   RDW 19.5 (H) 05/03/2021   LYMPHSABS 1.2 04/28/2021   MONOABS 1.0 04/28/2021   EOSABS 0.3 04/28/2021   BASOSABS 0.0 04/28/2021     Last metabolic panel Lab Results  Component Value Date   NA 135 05/03/2021   K 4.6 05/03/2021   CL 106 05/03/2021   CO2 24 05/03/2021   BUN 30 (H) 05/03/2021   CREATININE 1.14 (H) 05/03/2021   GLUCOSE 99 05/03/2021   GFRNONAA 50 (L) 05/03/2021   GFRAA 80 (L) 08/05/2013   CALCIUM 7.9 (L) 05/03/2021   PHOS 3.9 04/28/2021   PROT 5.8 (L) 04/30/2021   ALBUMIN 2.5 (L) 04/30/2021   BILITOT 0.4 04/30/2021   ALKPHOS 240 (H) 04/30/2021   AST 18 04/30/2021   ALT 12 04/30/2021  ANIONGAP 5 05/03/2021    CBG (last 3)  No results for input(s): GLUCAP in the last 72 hours.   GFR: Estimated Creatinine Clearance: 60.2 mL/min (A) (by C-G formula based on SCr of 1.14 mg/dL (H)).  Coagulation Profile: Recent Labs  Lab 04/28/21 1540  INR 1.5*    Recent Results (from the past 240 hour(s))  Resp Panel by RT-PCR (Flu A&B, Covid) Nasopharyngeal Swab     Status: None   Collection Time: 04/28/21  7:21 PM   Specimen: Nasopharyngeal Swab; Nasopharyngeal(NP) swabs in vial transport medium  Result Value Ref Range Status   SARS Coronavirus 2 by RT PCR NEGATIVE NEGATIVE Final    Comment: (NOTE) SARS-CoV-2 target nucleic acids are NOT DETECTED.  The  SARS-CoV-2 RNA is generally detectable in upper respiratory specimens during the acute phase of infection. The lowest concentration of SARS-CoV-2 viral copies this assay can detect is 138 copies/mL. A negative result does not preclude SARS-Cov-2 infection and should not be used as the sole basis for treatment or other patient management decisions. A negative result may occur with  improper specimen collection/handling, submission of specimen other than nasopharyngeal swab, presence of viral mutation(s) within the areas targeted by this assay, and inadequate number of viral copies(<138 copies/mL). A negative result must be combined with clinical observations, patient history, and epidemiological information. The expected result is Negative.  Fact Sheet for Patients:  BloggerCourse.com  Fact Sheet for Healthcare Providers:  SeriousBroker.it  This test is no t yet approved or cleared by the [place] FDA and  has been authorized for detection and/or diagnosis of SARS-CoV-2 by FDA under an Emergency Use Authorization (EUA). This EUA will remain  in effect (meaning this test can be used) for the duration of the COVID-19 declaration under Section 564(b)(1) of the Act, 21 U.S.C.section 360bbb-3(b)(1), unless the authorization is terminated  or revoked sooner.       Influenza A by PCR NEGATIVE NEGATIVE Final   Influenza B by PCR NEGATIVE NEGATIVE Final    Comment: (NOTE) The Xpert Xpress SARS-CoV-2/FLU/RSV plus assay is intended as an aid in the diagnosis of influenza from Nasopharyngeal swab specimens and should not be used as a sole basis for treatment. Nasal washings and aspirates are unacceptable for Xpert Xpress SARS-CoV-2/FLU/RSV testing.  Fact Sheet for Patients: BloggerCourse.com  Fact Sheet for Healthcare Providers: SeriousBroker.it  This test is not yet approved or  cleared by the [place] FDA and has been authorized for detection and/or diagnosis of SARS-CoV-2 by FDA under an Emergency Use Authorization (EUA). This EUA will remain in effect (meaning this test can be used) for the duration of the COVID-19 declaration under Section 564(b)(1) of the Act, 21 U.S.C. section 360bbb-3(b)(1), unless the authorization is terminated or revoked.  Performed at [facility], [address] [place], [place] [address]          Radiology Studies: US RENAL  Result Date: 05/01/2021 CLINICAL DATA:  Acute renal injury. EXAM: RENAL / URINARY TRACT ULTRASOUND COMPLETE COMPARISON:  CT abdomen and pelvis 03/15/2021. ultrasound abdomen 04/28/2021. FINDINGS: Right Kidney: Renal measurements: 9.9 x 5.3 x 5.2 cm = volume: 144 mL. Echogenicity within normal limits. No mass or hydronephrosis visualized. Left Kidney: Renal measurements: 9.4 x 5.9 x 5.5 cm = volume: 160 mL. Echogenicity within normal limits. No mass or hydronephrosis visualized. Bladder: Appears normal for degree of bladder distention. Bilateral ureteral jets are visualized. Other: Suboptimal secondary to patient breathing and body habitus. IMPRESSION: 1. No hydronephrosis.  Electronically Signed   By: Darliss Cheney M.D.   On: 05/01/2021 19:07        Scheduled Meds:  sodium chloride   Intravenous Once   busPIRone  15 mg Oral BID   diltiazem  180 mg Oral Daily   enoxaparin (LOVENOX) injection  60 mg Subcutaneous Q24H   influenza vaccine adjuvanted  0.5 mL Intramuscular Tomorrow-1000   levothyroxine  112 mcg Oral Q0600   multivitamin with minerals  1 tablet Oral Daily   Ensure Max Protein  11 oz Oral BID   sertraline  50 mg Oral Daily   Continuous Infusions:  methocarbamol (ROBAXIN) IV       LOS: 5 days    Marcellus Scott, MD, [place], [facility]. [facility]  To contact the attending provider between 7A-7P or the covering provider during after hours 7P-7A, please log into  the web site [email or web address] and access using universal [place] password for that web site. If you do not have the password, please call the hospital operator.

## 2021-05-03 NOTE — Care Management Important Message (Signed)
Important Message  Patient Details IM Letter given to the Patient. Name: Sally Wise MRN: [id number] Date of Birth: 1946/07/13   Medicare Important Message Given:  Yes     [name] 05/03/2021, 10:57 AM

## 2021-05-04 DIAGNOSIS — S82892D Other fracture of left lower leg, subsequent encounter for closed fracture with routine healing: Secondary | ICD-10-CM

## 2021-05-04 MED ORDER — MORPHINE SULFATE (PF) 2 MG/ML IV SOLN
2.0000 mg | Freq: Once | INTRAVENOUS | Status: AC
  Administered 2021-05-04: 2 mg via INTRAVENOUS
  Filled 2021-05-04: qty 1

## 2021-05-04 NOTE — Progress Notes (Addendum)
Subjective: Patient comfortable at rest. Left short leg splint and elevated appropriately. Patient denies pain.  Patient reports pain as mild.    Objective:   VITALS:  Temp:  [97.7 F (36.5 C)-99.3 F (37.4 C)] 97.7 F (36.5 C) (10/22 0429) Pulse Rate:  [59-67] 59 (10/22 0429) Resp:  [16-20] 16 (10/22 0429) BP: (113-131)/(51-62) 113/62 (10/22 0429) SpO2:  [95 %-96 %] 96 % (10/22 0429)  Gen: NAD, breathing room air  LLE: Short leg splint in place Skin intact, mild edema + Wiggles toes DP 2+ CR<2s  LABS Recent Labs    05/01/21 2117 05/02/21 0516 05/03/21 0453  HGB 8.5* 8.9* 8.4*  WBC 6.3 6.5 6.0  PLT 86* 88* 90*   Recent Labs    05/02/21 0516 05/03/21 0453  NA 133* 135  K 4.6 4.6  CL 104 106  CO2 24 24  BUN 27* 30*  CREATININE 1.21* 1.14*  GLUCOSE 93 99   No results for input(s): LABPT, INR in the last 72 hours.   Assessment/Plan: Left trimalleolar ankle fracture in setting of BMI 45, walker dependent, atrial fibrillation, hypothyroidism, chronic diastolic heart failure, currently admitted to hospitalist team for pain control and preoperative optimization ahead of surgery  -will need left ankle ORIF vs primary TTC nail hindfoot fusion -d/w OR control desk. Due to surgeon/OR availability, plan for OR with Dr. Toni Arthurs on Thursday 10/27 - discussed with patient and husband at bedside this morning -continue NWB LLE -maintain short leg splint -strict elevation of LLE --DVT ppx per primary medicine team -NPO from midnight on Thursday 0000  Netta Cedars 05/04/2021, 11:55 AM

## 2021-05-04 NOTE — Plan of Care (Signed)

## 2021-05-04 NOTE — Progress Notes (Signed)
PROGRESS NOTE    Sally Wise  FGH:[id number] DOB: 08-31-1945 DOA: 04/28/2021 PCP: Daisy Floro, MD   Brief Narrative: Sally Wise is a 75 y.o. female with a history of atrial fibrillation, hypothyroidism, depression, chronic diastolic heart failure, mucoid diarrhea. Patient presented after suffering a fall and found to have a left ankle fracture. Orthopedic surgery consulted and plan surgery on 10/24.  She has been medically optimized and Cardiology provided preop cardiac clearance is a low risk.  Communicated with her hematologist who indicates that her thrombocytopenia should not be a limitation for for her upcoming surgery and she is a low bleeding risk.   Assessment & Plan:   Active Problems:   Hypothyroidism   Morbid obesity (HCC)   PAF (paroxysmal atrial fibrillation) (HCC)   Thrombocytopenia (HCC)   Mucoid diarrhea   Ankle fracture   Chronic diastolic CHF (congestive heart failure) (HCC)   Anemia   Prolonged QT interval   Left ankle fracture Secondary to fall. Splint placed.  -Orthopedic surgery recommendations appreciated: Plan for left ankle ORIF versus possible external fixation of complex trimalleolar ankle fracture on 10/24 at 11:30 AM.  Delay in surgery so that there is improvement in the soft tissue edema around the fracture.   - She has been medically optimized and Cardiology provided preop cardiac clearance as a low risk.  Communicated with her hematologist who indicates that her thrombocytopenia should not be a limitation for for her upcoming surgery and she is a low bleeding risk. - Orthopedics follow-up appreciated.  Surgery has now been postponed to Thursday 10/27.  AKI Baseline creatinine of about 1. Creatinine of 1.36 on admission, up to 1.56. Overall seems euvolemic.  -Renal ultrasound negative for hydronephrosis. - Clinically appears euvolemic, DC IV fluids. - Not on any nephrotoxic's medication at this time. -Resolved.  Periodically  follow BMP.  Paroxysmal atrial fibrillation Not on anticoagulation, unclear etiology but as per cardiology, this was a shared decision making between patient and her primary cardiologist. CHA2DS2-VASc Score is 4. On diltiazem as an outpatient. -Continue diltiazem  Mucoid diarrhea Chronic issue although patient clarifies that she isn't actually having diarrhea.  GI panel PCR positive for enteropathogenic E. coli a month ago. -GI recommendations (Signed off 10/18): GI pathogen panel, consider probiotics-continue align, outpatient follow-up - Reportedly had sigmoidoscopy with Dr. Matthias Hughs recently. - No diarrhea.  In fact she did not have a BM for almost 3 days and had a formed BM 10/20.  Discontinued contact isolation.  Hypothyroidism Patient is on Euthyrox as an outpatient -Continue Synthroid  Chronic diastolic heart failure Appears euvolemic. Not on lasix as an outpatient.  Thrombocytopenia Chronic. Patient follows with hematology.  As per my communication with Dr. Clelia Croft yesterday, may proceed with surgery and is at low bleeding risk.  Acute on chronic anemia Patient follows with hematology. Acute anemia of unknown etiology. No obvious evidence of bleeding -FOBT - Unclear etiology.  Baseline hemoglobin appears to be in the 9-10 g range.  Currently stable in the mid 7 g range.?  Related to fracture and internal blood loss. Patient symptomatic with generalized weakness and malaise. -Hemoglobin is appropriately improved from 7.5-8.9-8.4.  Follow CBC in AM.  Elevated INR Unsure of etiology. AST/ALT normal. ALP elevated. No obvious bleeding.  Elevated alkaline phosphatase Unsure of etiology. GGT normal. Possible bone etiology but no obvious source. -CMP pending  Prolonged QTc EKG 10/20 showed QTC of 407 ms.  Potassium and magnesium appropriate.  Functional quadriplegia Patient and family at  bedside indicate that patient has had significant lower extremity weakness for almost a  year, worsened since her hospitalization in August 2022 and has since then been using a walker at home.  Has sustained multiple falls.  Seen by PCP and referred to neurology.  Has an upcoming appointment on Tuesday next week with Dr. Everlena Cooper, neurology and advised spouse to have it postponed since she is hospitalized.  She is concerned about neuropathy.  No history of DM.  B12: 3928 this admission. Therapies evaluation postop.  Likely will need SNF. Advised them that this may also be due to deconditioning.  Morbid obesity Body mass index is 45.16 kg/m.   DVT prophylaxis: SCDs Code Status:   Code Status: Full Code Family Communication: None at bedside today as well. Disposition Plan: Discharge home vs SNF pending orthopedic surgery recommendations/management, PT/OT recommendations.  Will likely need SNF.   Consultants:  Orthopedic surgery Gastroenterology Cardiology  Procedures:  None  Antimicrobials: None    Subjective: Patient seen along with spouse at bedside.  No specific complaints.  Did say that the orthopedic MD had come by and seen her yesterday.  This was before his follow-up today.  Objective: Vitals:   05/03/21 1415 05/03/21 2030 05/04/21 0429 05/04/21 1350  BP: (!) 128/51 (!) 131/55 113/62 116/69  Pulse: 66 67 (!) 59 (!) 58  Resp: 18 20 16 20   Temp: 98.6 F (37 C) 99.3 F (37.4 C) 97.7 F (36.5 C) 98.3 F (36.8 C)  TempSrc: Oral  Oral Oral  SpO2: 95% 96% 96% 99%  Weight:      Height:        Intake/Output Summary (Last 24 hours) at 05/04/2021 1547 Last data filed at 05/04/2021 0900 Gross per 24 hour  Intake 460 ml  Output 300 ml  Net 160 ml    Filed Weights   04/28/21 1518  Weight: 134.7 kg    Examination:  General exam: Elderly female, moderately built and obese lying comfortably propped up in bed without distress. Respiratory system: Clear to auscultation.  No increased work of breathing. Cardiovascular system: S1 & S2 heard, RRR. No murmurs,  rubs, gallops or clicks. Gastrointestinal system: Mildly obese abdomen but nondistended, soft and nontender.  No organomegaly or masses appreciated. Central nervous system: Alert and oriented. No focal neurological deficits. Musculoskeletal: Left lower extremity splint intact.  Able to wiggle her toes which look normal in color.  No change. Skin: No cyanosis. No rashes Psychiatry: Judgement and insight appear normal. Mood & affect appropriate.     Data Reviewed: I have personally reviewed following labs and imaging studies  CBC Lab Results  Component Value Date   WBC 6.0 05/03/2021   RBC 2.85 (L) 05/03/2021   HGB 8.4 (L) 05/03/2021   HCT 27.0 (L) 05/03/2021   MCV 94.7 05/03/2021   MCH 29.5 05/03/2021   PLT 90 (L) 05/03/2021   MCHC 31.1 05/03/2021   RDW 19.5 (H) 05/03/2021   LYMPHSABS 1.2 04/28/2021   MONOABS 1.0 04/28/2021   EOSABS 0.3 04/28/2021   BASOSABS 0.0 04/28/2021     Last metabolic panel Lab Results  Component Value Date   NA 135 05/03/2021   K 4.6 05/03/2021   CL 106 05/03/2021   CO2 24 05/03/2021   BUN 30 (H) 05/03/2021   CREATININE 1.14 (H) 05/03/2021   GLUCOSE 99 05/03/2021   GFRNONAA 50 (L) 05/03/2021   GFRAA 80 (L) 08/05/2013   CALCIUM 7.9 (L) 05/03/2021   PHOS 3.9 04/28/2021   PROT  5.8 (L) 04/30/2021   ALBUMIN 2.5 (L) 04/30/2021   BILITOT 0.4 04/30/2021   ALKPHOS 240 (H) 04/30/2021   AST 18 04/30/2021   ALT 12 04/30/2021   ANIONGAP 5 05/03/2021    CBG (last 3)  No results for input(s): GLUCAP in the last 72 hours.   GFR: Estimated Creatinine Clearance: 60.2 mL/min (A) (by C-G formula based on SCr of 1.14 mg/dL (H)).  Coagulation Profile: Recent Labs  Lab 04/28/21 1540  INR 1.5*    Recent Results (from the past 240 hour(s))  Resp Panel by RT-PCR (Flu A&B, Covid) Nasopharyngeal Swab     Status: None   Collection Time: 04/28/21  7:21 PM   Specimen: Nasopharyngeal Swab; Nasopharyngeal(NP) swabs in vial transport medium  Result Value  Ref Range Status   SARS Coronavirus 2 by RT PCR NEGATIVE NEGATIVE Final    Comment: (NOTE) SARS-CoV-2 target nucleic acids are NOT DETECTED.  The SARS-CoV-2 RNA is generally detectable in upper respiratory specimens during the acute phase of infection. The lowest concentration of SARS-CoV-2 viral copies this assay can detect is 138 copies/mL. A negative result does not preclude SARS-Cov-2 infection and should not be used as the sole basis for treatment or other patient management decisions. A negative result may occur with  improper specimen collection/handling, submission of specimen other than nasopharyngeal swab, presence of viral mutation(s) within the areas targeted by this assay, and inadequate number of viral copies(<138 copies/mL). A negative result must be combined with clinical observations, patient history, and epidemiological information. The expected result is Negative.  Fact Sheet for Patients:  BloggerCourse.com  Fact Sheet for Healthcare Providers:  SeriousBroker.it  This test is no t yet approved or cleared by the [place] FDA and  has been authorized for detection and/or diagnosis of SARS-CoV-2 by FDA under an Emergency Use Authorization (EUA). This EUA will remain  in effect (meaning this test can be used) for the duration of the COVID-19 declaration under Section 564(b)(1) of the Act, 21 U.S.C.section 360bbb-3(b)(1), unless the authorization is terminated  or revoked sooner.       Influenza A by PCR NEGATIVE NEGATIVE Final   Influenza B by PCR NEGATIVE NEGATIVE Final    Comment: (NOTE) The Xpert Xpress SARS-CoV-2/FLU/RSV plus assay is intended as an aid in the diagnosis of influenza from Nasopharyngeal swab specimens and should not be used as a sole basis for treatment. Nasal washings and aspirates are unacceptable for Xpert Xpress SARS-CoV-2/FLU/RSV testing.  Fact Sheet for  Patients: BloggerCourse.com  Fact Sheet for Healthcare Providers: SeriousBroker.it  This test is not yet approved or cleared by the [place] FDA and has been authorized for detection and/or diagnosis of SARS-CoV-2 by FDA under an Emergency Use Authorization (EUA). This EUA will remain in effect (meaning this test can be used) for the duration of the COVID-19 declaration under Section 564(b)(1) of the Act, 21 U.S.C. section 360bbb-3(b)(1), unless the authorization is terminated or revoked.  Performed at [facility], [address], [place], [place] [address]          Radiology Studies: No results found.      Scheduled Meds:  sodium chloride   Intravenous Once   busPIRone  15 mg Oral BID   diltiazem  180 mg Oral Daily   enoxaparin (LOVENOX) injection  60 mg Subcutaneous Q24H   influenza vaccine adjuvanted  0.5 mL Intramuscular Tomorrow-1000   levothyroxine  112 mcg Oral Q0600   multivitamin with minerals  1 tablet Oral  Daily   Ensure Max Protein  11 oz Oral BID   sertraline  50 mg Oral Daily   Continuous Infusions:  methocarbamol (ROBAXIN) IV       LOS: 6 days    Marcellus Scott, MD, [place], [facility]. [facility]  To contact the attending provider between 7A-7P or the covering provider during after hours 7P-7A, please log into the web site [email or web address] and access using universal  password for that web site. If you do not have the password, please call the hospital operator.

## 2021-05-05 DIAGNOSIS — S82892D Other fracture of left lower leg, subsequent encounter for closed fracture with routine healing: Secondary | ICD-10-CM | POA: Diagnosis not present

## 2021-05-05 MED ORDER — SENNA 8.6 MG PO TABS
2.0000 | ORAL_TABLET | Freq: Every day | ORAL | Status: DC
  Administered 2021-05-05 – 2021-05-06 (×2): 17.2 mg via ORAL
  Filled 2021-05-05 (×2): qty 2

## 2021-05-05 MED ORDER — POLYETHYLENE GLYCOL 3350 17 G PO PACK
17.0000 g | PACK | Freq: Every day | ORAL | Status: DC
  Administered 2021-05-05 – 2021-05-06 (×2): 17 g via ORAL
  Filled 2021-05-05 (×2): qty 1

## 2021-05-05 NOTE — Progress Notes (Signed)
PROGRESS NOTE    Sally Wise  PYP:[id number] DOB: [birth year] DOA: 04/28/2021 PCP: Daisy Floro, MD   Brief Narrative: Sally Wise is a 75 y.o. female with a history of atrial fibrillation, hypothyroidism, depression, chronic diastolic heart failure, mucoid diarrhea. Patient presented after suffering a fall and found to have a left ankle fracture. Orthopedic surgery consulted and plan surgery on 10/24.  She has been medically optimized and Cardiology provided preop cardiac clearance is a low risk.  Communicated with her hematologist who indicates that her thrombocytopenia should not be a limitation for for her upcoming surgery and she is a low bleeding risk.  Currently without acute issues.  Awaiting surgery scheduled for 10/27.   Assessment & Plan:   Active Problems:   Hypothyroidism   Morbid obesity (HCC)   PAF (paroxysmal atrial fibrillation) (HCC)   Thrombocytopenia (HCC)   Mucoid diarrhea   Ankle fracture   Chronic diastolic CHF (congestive heart failure) (HCC)   Anemia   Prolonged QT interval   Left ankle fracture Secondary to fall. Splint placed.  -Orthopedic surgery recommendations appreciated: Plan for left ankle ORIF versus possible external fixation of complex trimalleolar ankle fracture on 10/24 at 11:30 AM.  Delay in surgery so that there is improvement in the soft tissue edema around the fracture.   - She has been medically optimized and Cardiology provided preop cardiac clearance as a low risk.  Communicated with her hematologist who indicates that her thrombocytopenia should not be a limitation for for her upcoming surgery and she is a low bleeding risk. - Orthopedics follow-up appreciated.  Surgery has now been postponed to Thursday 10/27.  No new or acute issues.  AKI Baseline creatinine of about 1. Creatinine of 1.36 on admission, up to 1.56. Overall seems euvolemic.  -Renal ultrasound negative for hydronephrosis. - Clinically appears euvolemic,  DC IV fluids. - Not on any nephrotoxic's medication at this time. -Resolved.  Periodically follow BMP-we will repeat labs on 10/25 or 10/26.  Paroxysmal atrial fibrillation Not on anticoagulation, unclear etiology but as per cardiology, this was a shared decision making between patient and her primary cardiologist. CHA2DS2-VASc Score is 4. On diltiazem as an outpatient. -Continue diltiazem  Mucoid diarrhea Chronic issue although patient clarifies that she isn't actually having diarrhea.  GI panel PCR positive for enteropathogenic E. coli a month ago. -GI recommendations (Signed off 10/18): GI pathogen panel, consider probiotics-continue align, outpatient follow-up - Reportedly had sigmoidoscopy with Dr. Matthias Hughs recently. - No diarrhea.  In fact she did not have a BM for almost 3 days and had a formed BM 10/20.  Discontinued contact isolation.  Hypothyroidism Patient is on Euthyrox as an outpatient -Continue Synthroid  Chronic diastolic heart failure Appears euvolemic. Not on lasix as an outpatient.  Thrombocytopenia Chronic. Patient follows with hematology.  As per my communication with Dr. Clelia Croft couple days ago, may proceed with surgery and is at low bleeding risk.  Acute on chronic anemia Patient follows with hematology. Acute anemia of unknown etiology. No obvious evidence of bleeding -FOBT - Unclear etiology.  Baseline hemoglobin appears to be in the 9-10 g range.  Currently stable in the mid 7 g range.?  Related to fracture and internal blood loss. Patient symptomatic with generalized weakness and malaise. -Hemoglobin is appropriately improved from 7.5-8.9-8.4.  Follow CBC preop on 10/25 or 10/26.  Elevated INR Unsure of etiology. AST/ALT normal. ALP elevated. No obvious bleeding.  Elevated alkaline phosphatase Unsure of etiology. GGT normal. Possible bone  etiology but no obvious source. -CMP pending  Prolonged QTc EKG 10/20 showed QTC of 407 ms.  Potassium and magnesium  appropriate.  Functional quadriplegia Patient and family at bedside indicate that patient has had significant lower extremity weakness for almost a year, worsened since her hospitalization in August 2022 and has since then been using a walker at home.  Has sustained multiple falls.  Seen by PCP and referred to neurology.  Has an upcoming appointment on Tuesday next week with Dr. Everlena Cooper, neurology and advised spouse to have it postponed since she is hospitalized.  She is concerned about neuropathy.  No history of DM.  B12: 3928 this admission. Therapies evaluation postop.  Likely will need SNF. Advised them that this may also be due to deconditioning.  Morbid obesity Body mass index is 45.16 kg/m.   DVT prophylaxis: SCDs Code Status:   Code Status: Full Code Family Communication: None at bedside today as well. Disposition Plan: Discharge home vs SNF pending orthopedic surgery recommendations/management, PT/OT recommendations.  Will likely need SNF.   Consultants:  Orthopedic surgery Gastroenterology Cardiology  Procedures:  None  Antimicrobials: None    Subjective: No new complaints.  Objective: Vitals:   05/04/21 0429 05/04/21 1350 05/04/21 1950 05/05/21 1406  BP: 113/62 116/69 (!) 144/62 135/60  Pulse: (!) 59 (!) 58 63 68  Resp: 16 20 18 19   Temp: 97.7 F (36.5 C) 98.3 F (36.8 C) 97.9 F (36.6 C) 98.5 F (36.9 C)  TempSrc: Oral Oral Oral   SpO2: 96% 99% 100% 97%  Weight:      Height:        Intake/Output Summary (Last 24 hours) at 05/05/2021 1529 Last data filed at 05/05/2021 1300 Gross per 24 hour  Intake 240 ml  Output 650 ml  Net -410 ml    Filed Weights   04/28/21 1518  Weight: 134.7 kg    Examination:  Patient has remained stable without any significant changes.  General exam: Elderly female, moderately built and obese lying comfortably propped up in bed without distress. Respiratory system: Clear to auscultation.  No increased work of  breathing. Cardiovascular system: S1 & S2 heard, RRR. No murmurs, rubs, gallops or clicks. Gastrointestinal system: Mildly obese abdomen but nondistended, soft and nontender.  No organomegaly or masses appreciated. Central nervous system: Alert and oriented. No focal neurological deficits. Musculoskeletal: Left lower extremity splint intact.  Able to wiggle her toes which look normal in color.  No change. Skin: No cyanosis. No rashes Psychiatry: Judgement and insight appear normal. Mood & affect appropriate.     Data Reviewed: I have personally reviewed following labs and imaging studies  CBC Lab Results  Component Value Date   WBC 6.0 05/03/2021   RBC 2.85 (L) 05/03/2021   HGB 8.4 (L) 05/03/2021   HCT 27.0 (L) 05/03/2021   MCV 94.7 05/03/2021   MCH 29.5 05/03/2021   PLT 90 (L) 05/03/2021   MCHC 31.1 05/03/2021   RDW 19.5 (H) 05/03/2021   LYMPHSABS 1.2 04/28/2021   MONOABS 1.0 04/28/2021   EOSABS 0.3 04/28/2021   BASOSABS 0.0 04/28/2021     Last metabolic panel Lab Results  Component Value Date   NA 135 05/03/2021   K 4.6 05/03/2021   CL 106 05/03/2021   CO2 24 05/03/2021   BUN 30 (H) 05/03/2021   CREATININE 1.14 (H) 05/03/2021   GLUCOSE 99 05/03/2021   GFRNONAA 50 (L) 05/03/2021   GFRAA 80 (L) 08/05/2013   CALCIUM 7.9 (L) 05/03/2021  PHOS 3.9 04/28/2021   PROT 5.8 (L) 04/30/2021   ALBUMIN 2.5 (L) 04/30/2021   BILITOT 0.4 04/30/2021   ALKPHOS 240 (H) 04/30/2021   AST 18 04/30/2021   ALT 12 04/30/2021   ANIONGAP 5 05/03/2021    CBG (last 3)  No results for input(s): GLUCAP in the last 72 hours.   GFR: Estimated Creatinine Clearance: 60.2 mL/min (A) (by C-G formula based on SCr of 1.14 mg/dL (H)).  Coagulation Profile: Recent Labs  Lab 04/28/21 1540  INR 1.5*    Recent Results (from the past 240 hour(s))  Resp Panel by RT-PCR (Flu A&B, Covid) Nasopharyngeal Swab     Status: None   Collection Time: 04/28/21  7:21 PM   Specimen: Nasopharyngeal Swab;  Nasopharyngeal(NP) swabs in vial transport medium  Result Value Ref Range Status   SARS Coronavirus 2 by RT PCR NEGATIVE NEGATIVE Final    Comment: (NOTE) SARS-CoV-2 target nucleic acids are NOT DETECTED.  The SARS-CoV-2 RNA is generally detectable in upper respiratory specimens during the acute phase of infection. The lowest concentration of SARS-CoV-2 viral copies this assay can detect is 138 copies/mL. A negative result does not preclude SARS-Cov-2 infection and should not be used as the sole basis for treatment or other patient management decisions. A negative result may occur with  improper specimen collection/handling, submission of specimen other than nasopharyngeal swab, presence of viral mutation(s) within the areas targeted by this assay, and inadequate number of viral copies(<138 copies/mL). A negative result must be combined with clinical observations, patient history, and epidemiological information. The expected result is Negative.  Fact Sheet for Patients:  BloggerCourse.com  Fact Sheet for Healthcare Providers:  SeriousBroker.it  This test is no t yet approved or cleared by the [place] FDA and  has been authorized for detection and/or diagnosis of SARS-CoV-2 by FDA under an Emergency Use Authorization (EUA). This EUA will remain  in effect (meaning this test can be used) for the duration of the COVID-19 declaration under Section 564(b)(1) of the Act, 21 U.S.C.section 360bbb-3(b)(1), unless the authorization is terminated  or revoked sooner.       Influenza A by PCR NEGATIVE NEGATIVE Final   Influenza B by PCR NEGATIVE NEGATIVE Final    Comment: (NOTE) The Xpert Xpress SARS-CoV-2/FLU/RSV plus assay is intended as an aid in the diagnosis of influenza from Nasopharyngeal swab specimens and should not be used as a sole basis for treatment. Nasal washings and aspirates are unacceptable for Xpert Xpress  SARS-CoV-2/FLU/RSV testing.  Fact Sheet for Patients: BloggerCourse.com  Fact Sheet for Healthcare Providers: SeriousBroker.it  This test is not yet approved or cleared by the [place] FDA and has been authorized for detection and/or diagnosis of SARS-CoV-2 by FDA under an Emergency Use Authorization (EUA). This EUA will remain in effect (meaning this test can be used) for the duration of the COVID-19 declaration under Section 564(b)(1) of the Act, 21 U.S.C. section 360bbb-3(b)(1), unless the authorization is terminated or revoked.  Performed at [facility], [address], [place], [place] [address]          Radiology Studies: No results found.      Scheduled Meds:  sodium chloride   Intravenous Once   busPIRone  15 mg Oral BID   diltiazem  180 mg Oral Daily   enoxaparin (LOVENOX) injection  60 mg Subcutaneous Q24H   influenza vaccine adjuvanted  0.5 mL Intramuscular Tomorrow-1000   levothyroxine  112 mcg Oral Q0600   multivitamin  with minerals  1 tablet Oral Daily   Ensure Max Protein  11 oz Oral BID   sertraline  50 mg Oral Daily   Continuous Infusions:  methocarbamol (ROBAXIN) IV       LOS: 7 days    Marcellus Scott, MD, [place], [facility]. [facility]  To contact the attending provider between 7A-7P or the covering provider during after hours 7P-7A, please log into the web site [email or web address] and access using universal [place] password for that web site. If you do not have the password, please call the hospital operator.

## 2021-05-06 ENCOUNTER — Other Ambulatory Visit (HOSPITAL_COMMUNITY): Payer: Self-pay | Admitting: Orthopedic Surgery

## 2021-05-06 ENCOUNTER — Encounter (HOSPITAL_COMMUNITY): Payer: Self-pay | Admitting: Internal Medicine

## 2021-05-06 ENCOUNTER — Encounter: Payer: Medicare Other | Admitting: Physical Therapy

## 2021-05-06 DIAGNOSIS — S82892D Other fracture of left lower leg, subsequent encounter for closed fracture with routine healing: Secondary | ICD-10-CM | POA: Diagnosis not present

## 2021-05-06 MED ORDER — BISACODYL 10 MG RE SUPP
10.0000 mg | Freq: Every day | RECTAL | Status: DC | PRN
  Administered 2021-05-06: 10 mg via RECTAL
  Filled 2021-05-06: qty 1

## 2021-05-06 MED ORDER — MILK AND MOLASSES ENEMA
1.0000 | Freq: Once | RECTAL | Status: AC
  Administered 2021-05-06: 240 mL via RECTAL
  Filled 2021-05-06: qty 240

## 2021-05-06 NOTE — Progress Notes (Signed)
PROGRESS NOTE    Sally Wise  SAY:[id number] DOB: Jan 14, 1946 DOA: 04/28/2021 PCP: Daisy Floro, MD   Brief Narrative: Sally Wise is a 75 y.o. female with a history of atrial fibrillation, hypothyroidism, depression, chronic diastolic heart failure, mucoid diarrhea. Patient presented after suffering a fall and found to have a left ankle fracture. Orthopedic surgery consulted and plan surgery on 10/24.  She has been medically optimized and Cardiology provided preop cardiac clearance is a low risk.  Communicated with her hematologist who indicates that her thrombocytopenia should not be a limitation for for her upcoming surgery and she is a low bleeding risk.  Currently without acute issues.  Awaiting surgery, now rescheduled for 10/25 afternoon.   Assessment & Plan:   Active Problems:   Hypothyroidism   Morbid obesity (HCC)   PAF (paroxysmal atrial fibrillation) (HCC)   Thrombocytopenia (HCC)   Mucoid diarrhea   Ankle fracture   Chronic diastolic CHF (congestive heart failure) (HCC)   Anemia   Prolonged QT interval   Left ankle fracture Secondary to fall. Splint placed.  -Orthopedic surgery recommendations appreciated: Plan for left ankle ORIF versus possible external fixation of complex trimalleolar ankle fracture on 10/24 at 11:30 AM.  Delay in surgery so that there is improvement in the soft tissue edema around the fracture.   - She has been medically optimized and Cardiology provided preop cardiac clearance as a low risk.  Communicated with her hematologist who indicates that her thrombocytopenia should not be a limitation for for her upcoming surgery and she is a low bleeding risk. - Orthopedics follow-up appreciated.  Surgery has now been rescheduled for tomorrow 10/25 afternoon.  AKI Baseline creatinine of about 1. Creatinine of 1.36 on admission, up to 1.56. Overall seems euvolemic.  -Renal ultrasound negative for hydronephrosis. - Clinically appears  euvolemic, DC IV fluids. - Not on any nephrotoxic's medication at this time. -Resolved.  Periodically follow BMP-we will repeat labs for a.m.  Paroxysmal atrial fibrillation Not on anticoagulation, unclear etiology but as per cardiology, this was a shared decision making between patient and her primary cardiologist. CHA2DS2-VASc Score is 4. On diltiazem as an outpatient. -Continue diltiazem  Mucoid diarrhea/constipation Chronic issue although patient clarifies that she isn't actually having diarrhea.  GI panel PCR positive for enteropathogenic E. coli a month ago. -GI recommendations (Signed off 10/18): GI pathogen panel, consider probiotics-continue align, outpatient follow-up - Reportedly had sigmoidoscopy with Dr. Matthias Hughs recently. - Now constipated.  Minimal relief with Dulcolax suppository.  As per patient request, ordered enema.  Hypothyroidism Patient is on Euthyrox as an outpatient -Continue Synthroid  Chronic diastolic heart failure Appears euvolemic. Not on lasix as an outpatient.  Thrombocytopenia Chronic. Patient follows with hematology.  As per my communication with Dr. Clelia Croft couple days ago, may proceed with surgery and is at low bleeding risk.  Acute on chronic anemia Patient follows with hematology. Acute anemia of unknown etiology. No obvious evidence of bleeding -FOBT - Unclear etiology.  Baseline hemoglobin appears to be in the 9-10 g range.  Currently stable in the mid 7 g range.?  Related to fracture and internal blood loss. Patient symptomatic with generalized weakness and malaise. -Hemoglobin is appropriately improved from 7.5-8.9-8.4.  Follow CBC preop on 10/25 or 10/26.  Elevated INR Unsure of etiology. AST/ALT normal. ALP elevated. No obvious bleeding.  Elevated alkaline phosphatase Unsure of etiology. GGT normal. Possible bone etiology but no obvious source. -CMP pending  Prolonged QTc EKG 10/20 showed QTC of  407 ms.  Potassium and magnesium  appropriate.  Functional quadriplegia Patient and family at bedside indicate that patient has had significant lower extremity weakness for almost a year, worsened since her hospitalization in August 2022 and has since then been using a walker at home.  Has sustained multiple falls.  Seen by PCP and referred to neurology.  Has an upcoming appointment on Tuesday next week with Dr. Everlena Cooper, neurology and advised spouse to have it postponed since she is hospitalized.  She is concerned about neuropathy.  No history of DM.  B12: 3928 this admission. Therapies evaluation postop.  Likely will need SNF. Advised them that this may also be due to deconditioning.  Morbid obesity Body mass index is 45.16 kg/m.   DVT prophylaxis: SCDs Code Status:   Code Status: Full Code Family Communication: Discussed with spouse at bedside. Disposition Plan: Discharge home vs SNF pending orthopedic surgery recommendations/management, PT/OT recommendations.  Will likely need SNF.  Patient however not particularly keen on going to SNF.   Consultants:  Orthopedic surgery Gastroenterology Cardiology  Procedures:  None  Antimicrobials: None    Subjective: Constipated.  Initiated MiraLAX and senna yesterday without effect.  Dulcolax suppository this morning with minimal effect.  Wants to try enema.  No abdominal pain, nausea or vomiting.  Somewhat anxious about upcoming surgery tomorrow.  Objective: Vitals:   05/04/21 1950 05/05/21 1406 05/05/21 2025 05/06/21 0520  BP: (!) 144/62 135/60 128/60 130/70  Pulse: 63 68 70 65  Resp: 18 19 20 16   Temp: 97.9 F (36.6 C) 98.5 F (36.9 C) 98.9 F (37.2 C) 97.9 F (36.6 C)  TempSrc: Oral  Oral Oral  SpO2: 100% 97% 96% 98%  Weight:      Height:        Intake/Output Summary (Last 24 hours) at 05/06/2021 1659 Last data filed at 05/06/2021 1459 Gross per 24 hour  Intake 1303 ml  Output 751 ml  Net 552 ml    Filed Weights   04/28/21 1518  Weight: 134.7 kg     Examination:   General exam: Elderly female, moderately built and obese lying comfortably propped up in bed without distress. Respiratory system: Clear to auscultation.  No increased work of breathing. Cardiovascular system: S1 & S2 heard, RRR. No murmurs, rubs, gallops or clicks. Gastrointestinal system: Nondistended, soft and nontender.  Normal bowel sounds heard. Central nervous system: Alert and oriented. No focal neurological deficits. Musculoskeletal: Left lower extremity splint intact.  Able to wiggle her toes which look normal in color.  No change in status Skin: No cyanosis. No rashes Psychiatry: Judgement and insight appear normal. Mood & affect appropriate.     Data Reviewed: I have personally reviewed following labs and imaging studies  CBC Lab Results  Component Value Date   WBC 6.0 05/03/2021   RBC 2.85 (L) 05/03/2021   HGB 8.4 (L) 05/03/2021   HCT 27.0 (L) 05/03/2021   MCV 94.7 05/03/2021   MCH 29.5 05/03/2021   PLT 90 (L) 05/03/2021   MCHC 31.1 05/03/2021   RDW 19.5 (H) 05/03/2021   LYMPHSABS 1.2 04/28/2021   MONOABS 1.0 04/28/2021   EOSABS 0.3 04/28/2021   BASOSABS 0.0 04/28/2021     Last metabolic panel Lab Results  Component Value Date   NA 135 05/03/2021   K 4.6 05/03/2021   CL 106 05/03/2021   CO2 24 05/03/2021   BUN 30 (H) 05/03/2021   CREATININE 1.14 (H) 05/03/2021   GLUCOSE 99 05/03/2021   GFRNONAA 50 (  L) 05/03/2021   GFRAA 80 (L) 08/05/2013   CALCIUM 7.9 (L) 05/03/2021   PHOS 3.9 04/28/2021   PROT 5.8 (L) 04/30/2021   ALBUMIN 2.5 (L) 04/30/2021   BILITOT 0.4 04/30/2021   ALKPHOS 240 (H) 04/30/2021   AST 18 04/30/2021   ALT 12 04/30/2021   ANIONGAP 5 05/03/2021    CBG (last 3)  No results for input(s): GLUCAP in the last 72 hours.   GFR: Estimated Creatinine Clearance: 60.2 mL/min (A) (by C-G formula based on SCr of 1.14 mg/dL (H)).  Coagulation Profile: No results for input(s): INR, PROTIME in the last 168  hours.   Recent Results (from the past 240 hour(s))  Resp Panel by RT-PCR (Flu A&B, Covid) Nasopharyngeal Swab     Status: None   Collection Time: 04/28/21  7:21 PM   Specimen: Nasopharyngeal Swab; Nasopharyngeal(NP) swabs in vial transport medium  Result Value Ref Range Status   SARS Coronavirus 2 by RT PCR NEGATIVE NEGATIVE Final    Comment: (NOTE) SARS-CoV-2 target nucleic acids are NOT DETECTED.  The SARS-CoV-2 RNA is generally detectable in upper respiratory specimens during the acute phase of infection. The lowest concentration of SARS-CoV-2 viral copies this assay can detect is 138 copies/mL. A negative result does not preclude SARS-Cov-2 infection and should not be used as the sole basis for treatment or other patient management decisions. A negative result may occur with  improper specimen collection/handling, submission of specimen other than nasopharyngeal swab, presence of viral mutation(s) within the areas targeted by this assay, and inadequate number of viral copies(<138 copies/mL). A negative result must be combined with clinical observations, patient history, and epidemiological information. The expected result is Negative.  Fact Sheet for Patients:  BloggerCourse.com  Fact Sheet for Healthcare Providers:  SeriousBroker.it  This test is no t yet approved or cleared by the [place] FDA and  has been authorized for detection and/or diagnosis of SARS-CoV-2 by FDA under an Emergency Use Authorization (EUA). This EUA will remain  in effect (meaning this test can be used) for the duration of the COVID-19 declaration under Section 564(b)(1) of the Act, 21 U.S.C.section 360bbb-3(b)(1), unless the authorization is terminated  or revoked sooner.       Influenza A by PCR NEGATIVE NEGATIVE Final   Influenza B by PCR NEGATIVE NEGATIVE Final    Comment: (NOTE) The Xpert Xpress SARS-CoV-2/FLU/RSV plus assay is intended  as an aid in the diagnosis of influenza from Nasopharyngeal swab specimens and should not be used as a sole basis for treatment. Nasal washings and aspirates are unacceptable for Xpert Xpress SARS-CoV-2/FLU/RSV testing.  Fact Sheet for Patients: BloggerCourse.com  Fact Sheet for Healthcare Providers: SeriousBroker.it  This test is not yet approved or cleared by the [place] FDA and has been authorized for detection and/or diagnosis of SARS-CoV-2 by FDA under an Emergency Use Authorization (EUA). This EUA will remain in effect (meaning this test can be used) for the duration of the COVID-19 declaration under Section 564(b)(1) of the Act, 21 U.S.C. section 360bbb-3(b)(1), unless the authorization is terminated or revoked.  Performed at [facility], [address], [place], [place] [address]          Radiology Studies: No results found.      Scheduled Meds:  sodium chloride   Intravenous Once   busPIRone  15 mg Oral BID   diltiazem  180 mg Oral Daily   influenza vaccine adjuvanted  0.5 mL Intramuscular Tomorrow-1000   levothyroxine  112 mcg Oral Q0600   multivitamin with minerals  1 tablet Oral Daily   polyethylene glycol  17 g Oral Daily   Ensure Max Protein  11 oz Oral BID   senna  2 tablet Oral Daily   sertraline  50 mg Oral Daily   Continuous Infusions:  methocarbamol (ROBAXIN) IV       LOS: 8 days    Marcellus Scott, MD, [place], [facility]. [facility]  To contact the attending provider between 7A-7P or the covering provider during after hours 7P-7A, please log into the web site [email or web address] and access using universal [place] password for that web site. If you do not have the password, please call the hospital operator.

## 2021-05-06 NOTE — Progress Notes (Signed)
Subjective: Pt with left ankle trimal fracture dislocation.  She is splinted and has been medically optimized by the hospitalist team.  [facility] on the OR schedule for tomorrow afternoon.   Objective: Vital signs in last 24 hours: Temp:  [97.9 F (36.6 C)-98.9 F (37.2 C)] 98.6 F (37 C) (10/24 1457) Pulse Rate:  [65-91] 91 (10/24 1457) Resp:  [16-20] 16 (10/24 1457) BP: (128-133)/(60-72) 133/72 (10/24 1457) SpO2:  [96 %-98 %] 98 % (10/24 0520)  Intake/Output from previous day: 10/23 0701 - 10/24 0700 In: 240 [P.O.:240] Out: 301 [Urine:300; Stool:1] Intake/Output this shift: Total I/O In: 1063 [P.O.:1063] Out: 450 [Urine:450]  No results for input(s): HGB in the last 72 hours. No results for input(s): WBC, RBC, HCT, PLT in the last 72 hours. No results for input(s): NA, K, CL, CO2, BUN, CREATININE, GLUCOSE, CALCIUM in the last 72 hours. No results for input(s): LABPT, INR in the last 72 hours.  deferred    Assessment/Plan: L ankle trimal fracture dislocation - I spoke with the patient today by phone.  We reviewed her history and imaging studies.  She will need open treatment of this left ankle trimalleolar fracture dislocation with internal fixation.  she will be nonweightbearing postoperatively for 6 weeks.  She understands the risks and benefits of the alternative treatment options and elect surgical treatment.The patient specifically understands risks of bleeding, infection, nerve damage, blood clots, need for additional surgery, continued pain, nonunion, post traumatic arthritis, recurrence of deformity, amputation and death. Nothing by mouth after midnight.  Hold blood thinners.  Preop orders are entered in epic.  She understands the plan and agrees.     Toni Arthurs 05/06/2021, 5:39 PM

## 2021-05-06 NOTE — Care Management Important Message (Signed)
Important Message  Patient Details IM Letter given to the Patient. Name: MARKALA SITTS MRN: [id number] Date of Birth: [birth year]   Medicare Important Message Given:  Yes     [name] 05/06/2021, 12:03 PM

## 2021-05-06 NOTE — Progress Notes (Signed)
Consent for surgery signed and dated by patient for ORIF tomorrow.

## 2021-05-07 ENCOUNTER — Inpatient Hospital Stay (HOSPITAL_COMMUNITY): Payer: Medicare Other | Admitting: Certified Registered Nurse Anesthetist

## 2021-05-07 ENCOUNTER — Ambulatory Visit (HOSPITAL_COMMUNITY): Admission: RE | Admit: 2021-05-07 | Payer: Medicare Other | Source: Ambulatory Visit | Admitting: Orthopedic Surgery

## 2021-05-07 ENCOUNTER — Encounter (HOSPITAL_COMMUNITY)
Admission: EM | Disposition: A | Discharge status: Skilled Nursing Facility | Payer: Self-pay | Source: Home / Self Care | Attending: Family Medicine

## 2021-05-07 ENCOUNTER — Other Ambulatory Visit: Payer: Self-pay

## 2021-05-07 ENCOUNTER — Encounter (HOSPITAL_COMMUNITY): Payer: Self-pay | Admitting: Internal Medicine

## 2021-05-07 ENCOUNTER — Ambulatory Visit: Payer: Medicare Other | Admitting: Neurology

## 2021-05-07 DIAGNOSIS — S82892D Other fracture of left lower leg, subsequent encounter for closed fracture with routine healing: Secondary | ICD-10-CM | POA: Diagnosis not present

## 2021-05-07 HISTORY — PX: ORIF ANKLE FRACTURE: SHX5408

## 2021-05-07 LAB — CBC
HCT: 27.2 % — ABNORMAL LOW (ref 36.0–46.0)
Hemoglobin: 8.4 g/dL — ABNORMAL LOW (ref 12.0–15.0)
MCH: 29.2 pg (ref 26.0–34.0)
MCHC: 30.9 g/dL (ref 30.0–36.0)
MCV: 94.4 fL (ref 80.0–100.0)
Platelets: 100 10*3/uL — ABNORMAL LOW (ref 150–400)
RBC: 2.88 MIL/uL — ABNORMAL LOW (ref 3.87–5.11)
RDW: 19.5 % — ABNORMAL HIGH (ref 11.5–15.5)
WBC: 5.5 10*3/uL (ref 4.0–10.5)
nRBC: 0.4 % — ABNORMAL HIGH (ref 0.0–0.2)

## 2021-05-07 LAB — BASIC METABOLIC PANEL
Anion gap: 8 (ref 5–15)
BUN: 30 mg/dL — ABNORMAL HIGH (ref 8–23)
CO2: 25 mmol/L (ref 22–32)
Calcium: 8.2 mg/dL — ABNORMAL LOW (ref 8.9–10.3)
Chloride: 102 mmol/L (ref 98–111)
Creatinine, Ser: 1.02 mg/dL — ABNORMAL HIGH (ref 0.44–1.00)
GFR, Estimated: 57 mL/min — ABNORMAL LOW (ref >60)
Glucose, Bld: 89 mg/dL (ref 70–99)
Potassium: 5 mmol/L (ref 3.5–5.1)
Sodium: 135 mmol/L (ref 135–145)

## 2021-05-07 LAB — ABO/RH: ABO/RH(D): A POS

## 2021-05-07 LAB — SURGICAL PCR SCREEN
MRSA, PCR: NEGATIVE
Staphylococcus aureus: NEGATIVE

## 2021-05-07 SURGERY — OPEN REDUCTION INTERNAL FIXATION (ORIF) ANKLE FRACTURE
Anesthesia: General | Site: Ankle | Laterality: Left

## 2021-05-07 MED ORDER — POVIDONE-IODINE 10 % EX SWAB
2.0000 "application " | Freq: Once | CUTANEOUS | Status: AC
  Administered 2021-05-07: 2 via TOPICAL

## 2021-05-07 MED ORDER — ONDANSETRON HCL 4 MG/2ML IJ SOLN
INTRAMUSCULAR | Status: DC | PRN
  Administered 2021-05-07: 4 mg via INTRAVENOUS

## 2021-05-07 MED ORDER — ENOXAPARIN SODIUM 40 MG/0.4ML IJ SOSY
40.0000 mg | PREFILLED_SYRINGE | INTRAMUSCULAR | Status: DC
  Administered 2021-05-08 – 2021-05-13 (×6): 40 mg via SUBCUTANEOUS
  Filled 2021-05-07 (×5): qty 0.4

## 2021-05-07 MED ORDER — MIDAZOLAM HCL 2 MG/2ML IJ SOLN
1.0000 mg | INTRAMUSCULAR | Status: DC
  Filled 2021-05-07: qty 2

## 2021-05-07 MED ORDER — OXYCODONE HCL 5 MG/5ML PO SOLN
5.0000 mg | Freq: Once | ORAL | Status: DC | PRN
Start: 2021-05-07 — End: 2021-05-07

## 2021-05-07 MED ORDER — FENTANYL CITRATE (PF) 100 MCG/2ML IJ SOLN
INTRAMUSCULAR | Status: DC | PRN
  Administered 2021-05-07 (×2): 50 ug via INTRAVENOUS

## 2021-05-07 MED ORDER — OXYCODONE HCL 5 MG PO TABS: 5.0000 mg | ORAL_TABLET | Freq: Once | ORAL | Status: DC | PRN

## 2021-05-07 MED ORDER — CHLORHEXIDINE GLUCONATE 4 % EX LIQD
60.0000 mL | Freq: Once | CUTANEOUS | Status: AC
  Administered 2021-05-07: 4 via TOPICAL
  Filled 2021-05-07: qty 60

## 2021-05-07 MED ORDER — LIDOCAINE HCL (PF) 2 % IJ SOLN
INTRAMUSCULAR | Status: AC
  Filled 2021-05-07: qty 5

## 2021-05-07 MED ORDER — METOCLOPRAMIDE HCL 5 MG PO TABS: 5.0000 mg | ORAL_TABLET | Freq: Three times a day (TID) | ORAL | Status: DC | PRN

## 2021-05-07 MED ORDER — VANCOMYCIN HCL 1000 MG IV SOLR
INTRAVENOUS | Status: DC | PRN
  Administered 2021-05-07: 1000 mg via TOPICAL

## 2021-05-07 MED ORDER — LACTATED RINGERS IV SOLN: INTRAVENOUS | Status: DC | PRN

## 2021-05-07 MED ORDER — DOCUSATE SODIUM 100 MG PO CAPS
100.0000 mg | ORAL_CAPSULE | Freq: Two times a day (BID) | ORAL | Status: DC
Start: 2021-05-07 — End: 2021-05-13
  Administered 2021-05-09 – 2021-05-12 (×5): 100 mg via ORAL
  Filled 2021-05-07 (×7): qty 1

## 2021-05-07 MED ORDER — ROCURONIUM BROMIDE 10 MG/ML (PF) SYRINGE
PREFILLED_SYRINGE | INTRAVENOUS | Status: DC | PRN
  Administered 2021-05-07: 70 mg via INTRAVENOUS

## 2021-05-07 MED ORDER — SUGAMMADEX SODIUM 500 MG/5ML IV SOLN
INTRAVENOUS | Status: DC | PRN
  Administered 2021-05-07: 300 mg via INTRAVENOUS

## 2021-05-07 MED ORDER — ONDANSETRON HCL 4 MG/2ML IJ SOLN: 4.0000 mg | Freq: Once | INTRAMUSCULAR | Status: DC | PRN

## 2021-05-07 MED ORDER — FENTANYL CITRATE PF 50 MCG/ML IJ SOSY: 25.0000 ug | PREFILLED_SYRINGE | INTRAMUSCULAR | Status: DC | PRN

## 2021-05-07 MED ORDER — SODIUM CHLORIDE 0.9 % IV SOLN: INTRAVENOUS | Status: DC

## 2021-05-07 MED ORDER — CEFAZOLIN IN SODIUM CHLORIDE 3-0.9 GM/100ML-% IV SOLN
3.0000 g | INTRAVENOUS | Status: AC
  Administered 2021-05-07: 3 g via INTRAVENOUS
  Filled 2021-05-07: qty 100

## 2021-05-07 MED ORDER — DEXAMETHASONE SODIUM PHOSPHATE 10 MG/ML IJ SOLN
INTRAMUSCULAR | Status: DC | PRN
  Administered 2021-05-07: 4 mg via INTRAVENOUS

## 2021-05-07 MED ORDER — OXYCODONE HCL 5 MG PO TABS: 10.0000 mg | ORAL_TABLET | ORAL | Status: DC | PRN

## 2021-05-07 MED ORDER — METOCLOPRAMIDE HCL 5 MG/ML IJ SOLN: 5.0000 mg | Freq: Three times a day (TID) | INTRAMUSCULAR | Status: DC | PRN

## 2021-05-07 MED ORDER — HYDROMORPHONE HCL 1 MG/ML IJ SOLN
0.5000 mg | INTRAMUSCULAR | Status: DC | PRN
  Administered 2021-05-07: 0.5 mg via INTRAVENOUS
  Filled 2021-05-07: qty 1

## 2021-05-07 MED ORDER — DEXAMETHASONE SODIUM PHOSPHATE 10 MG/ML IJ SOLN
INTRAMUSCULAR | Status: AC
  Filled 2021-05-07: qty 1

## 2021-05-07 MED ORDER — OXYCODONE HCL 5 MG PO TABS
5.0000 mg | ORAL_TABLET | ORAL | Status: DC | PRN
  Administered 2021-05-08 (×2): 5 mg via ORAL
  Administered 2021-05-09 (×2): 10 mg via ORAL
  Administered 2021-05-10 – 2021-05-13 (×4): 5 mg via ORAL
  Filled 2021-05-07 (×4): qty 1
  Filled 2021-05-07: qty 2
  Filled 2021-05-07: qty 1
  Filled 2021-05-07: qty 2
  Filled 2021-05-07: qty 1
  Filled 2021-05-07: qty 2

## 2021-05-07 MED ORDER — PROPOFOL 10 MG/ML IV BOLUS
INTRAVENOUS | Status: DC | PRN
  Administered 2021-05-07: 150 mg via INTRAVENOUS

## 2021-05-07 MED ORDER — LIDOCAINE 2% (20 MG/ML) 5 ML SYRINGE
INTRAMUSCULAR | Status: DC | PRN
Start: 2021-05-07 — End: 2021-05-07
  Administered 2021-05-07: 70 mg via INTRAVENOUS

## 2021-05-07 MED ORDER — PHENYLEPHRINE 40 MCG/ML (10ML) SYRINGE FOR IV PUSH (FOR BLOOD PRESSURE SUPPORT)
PREFILLED_SYRINGE | INTRAVENOUS | Status: AC
  Filled 2021-05-07: qty 10

## 2021-05-07 MED ORDER — AMISULPRIDE (ANTIEMETIC) 5 MG/2ML IV SOLN: 10.0000 mg | Freq: Once | INTRAVENOUS | Status: DC | PRN

## 2021-05-07 MED ORDER — ONDANSETRON HCL 4 MG/2ML IJ SOLN
INTRAMUSCULAR | Status: AC
  Filled 2021-05-07: qty 2

## 2021-05-07 MED ORDER — ACETAMINOPHEN 325 MG PO TABS: 325.0000 mg | ORAL_TABLET | Freq: Four times a day (QID) | ORAL | Status: DC | PRN

## 2021-05-07 MED ORDER — PHENYLEPHRINE 40 MCG/ML (10ML) SYRINGE FOR IV PUSH (FOR BLOOD PRESSURE SUPPORT)
PREFILLED_SYRINGE | INTRAVENOUS | Status: DC | PRN
  Administered 2021-05-07: 80 ug via INTRAVENOUS

## 2021-05-07 MED ORDER — SUGAMMADEX SODIUM 500 MG/5ML IV SOLN
INTRAVENOUS | Status: AC
  Filled 2021-05-07: qty 5

## 2021-05-07 MED ORDER — FENTANYL CITRATE PF 50 MCG/ML IJ SOSY
50.0000 ug | PREFILLED_SYRINGE | INTRAMUSCULAR | Status: DC
Start: 2021-05-07 — End: 2021-05-07
  Administered 2021-05-07: 50 ug via INTRAVENOUS
  Filled 2021-05-07: qty 2

## 2021-05-07 MED ORDER — FENTANYL CITRATE (PF) 100 MCG/2ML IJ SOLN
INTRAMUSCULAR | Status: AC
  Filled 2021-05-07: qty 2

## 2021-05-07 MED ORDER — 0.9 % SODIUM CHLORIDE (POUR BTL) OPTIME
TOPICAL | Status: DC | PRN
  Administered 2021-05-07: 1000 mL

## 2021-05-07 MED ORDER — ROCURONIUM BROMIDE 10 MG/ML (PF) SYRINGE
PREFILLED_SYRINGE | INTRAVENOUS | Status: AC
  Filled 2021-05-07: qty 10

## 2021-05-07 MED ORDER — DIPHENHYDRAMINE HCL 12.5 MG/5ML PO ELIX: 12.5000 mg | ORAL_SOLUTION | ORAL | Status: DC | PRN

## 2021-05-07 MED ORDER — PROPOFOL 10 MG/ML IV BOLUS
INTRAVENOUS | Status: AC
  Filled 2021-05-07: qty 20

## 2021-05-07 MED ORDER — SENNA 8.6 MG PO TABS
1.0000 | ORAL_TABLET | Freq: Two times a day (BID) | ORAL | Status: DC
  Administered 2021-05-10 – 2021-05-12 (×4): 8.6 mg via ORAL
  Filled 2021-05-07 (×6): qty 1

## 2021-05-07 SURGICAL SUPPLY — 69 items
ADAPTER GUIDE FAST 1.6 (MISCELLANEOUS) ×2 IMPLANT
ANCHOR SUT KEITH ABD SZ2 STR (SUTURE) ×2 IMPLANT
BAG COUNTER SPONGE SURGICOUNT (BAG) IMPLANT
BAG ZIPLOCK 12X15 (MISCELLANEOUS) ×2 IMPLANT
BIT DRILL 2.5X2.75 QC CALB (BIT) ×2 IMPLANT
BNDG COHESIVE 4X5 TAN ST LF (GAUZE/BANDAGES/DRESSINGS) IMPLANT
BNDG COHESIVE 6X5 TAN ST LF (GAUZE/BANDAGES/DRESSINGS) IMPLANT
BNDG ELASTIC 4X5.8 VLCR STR LF (GAUZE/BANDAGES/DRESSINGS) ×4 IMPLANT
BNDG ELASTIC 6X5.8 VLCR STR LF (GAUZE/BANDAGES/DRESSINGS) ×2 IMPLANT
BNDG ESMARK 4X9 LF (GAUZE/BANDAGES/DRESSINGS) ×2 IMPLANT
BNDG GAUZE ELAST 4 BULKY (GAUZE/BANDAGES/DRESSINGS) IMPLANT
CHLORAPREP W/TINT 26 (MISCELLANEOUS) ×2 IMPLANT
COVER SURGICAL LIGHT HANDLE (MISCELLANEOUS) ×2 IMPLANT
CUFF TOURN SGL QUICK 34 (TOURNIQUET CUFF)
CUFF TRNQT CYL 34X4.125X (TOURNIQUET CUFF) IMPLANT
DECANTER SPIKE VIAL GLASS SM (MISCELLANEOUS) IMPLANT
DRAPE C-ARM 42X120 X-RAY (DRAPES) IMPLANT
DRAPE EXTREMITY T 121X128X90 (DISPOSABLE) ×2 IMPLANT
DRAPE OEC MINIVIEW 54X84 (DRAPES) ×2 IMPLANT
DRAPE ORTHO SPLIT 77X108 STRL (DRAPES) ×4
DRAPE SURG ORHT 6 SPLT 77X108 (DRAPES) ×2 IMPLANT
DRAPE U-SHAPE 47X51 STRL (DRAPES) ×2 IMPLANT
DRSG ADAPTIC 3X8 NADH LF (GAUZE/BANDAGES/DRESSINGS) IMPLANT
DRSG MEPITEL 4X7.2 (GAUZE/BANDAGES/DRESSINGS) ×2 IMPLANT
DRSG PAD ABDOMINAL 8X10 ST (GAUZE/BANDAGES/DRESSINGS) ×4 IMPLANT
DURAPREP 26ML APPLICATOR (WOUND CARE) IMPLANT
ELECT REM PT RETURN 15FT ADLT (MISCELLANEOUS) ×2 IMPLANT
FACESHIELD WRAPAROUND (MASK) IMPLANT
GAUZE SPONGE 4X4 12PLY STRL (GAUZE/BANDAGES/DRESSINGS) ×2 IMPLANT
GLOVE SRG 8 PF TXTR STRL LF DI (GLOVE) ×1 IMPLANT
GLOVE SURG MICRO LTX SZ7.5 (GLOVE) ×2 IMPLANT
GLOVE SURG UNDER POLY LF SZ8 (GLOVE) ×2
K-WIRE ACE 1.6X6 (WIRE) ×4
KIT BASIN OR (CUSTOM PROCEDURE TRAY) ×2 IMPLANT
KIT TURNOVER KIT A (KITS) IMPLANT
KWIRE ACE 1.6X6 (WIRE) ×2 IMPLANT
NEEDLE HYPO 22GX1.5 SAFETY (NEEDLE) ×2 IMPLANT
NEEDLE MAYO CATGUT SZ4 (NEEDLE) ×2 IMPLANT
NS IRRIG 1000ML POUR BTL (IV SOLUTION) ×2 IMPLANT
PACK TOTAL JOINT (CUSTOM PROCEDURE TRAY) ×2 IMPLANT
PAD CAST 4YDX4 CTTN HI CHSV (CAST SUPPLIES) ×1 IMPLANT
PADDING CAST COTTON 4X4 STRL (CAST SUPPLIES) ×2
PADDING CAST COTTON 6X4 STRL (CAST SUPPLIES) ×2 IMPLANT
PLATE LOCK 7H 92 BILAT FIB (Plate) ×2 IMPLANT
PROTECTOR NERVE ULNAR (MISCELLANEOUS) ×2 IMPLANT
SCREW ACE CAN 4.0 40M (Screw) ×4 IMPLANT
SCREW ACE CAN 4.0 46M (Screw) ×2 IMPLANT
SCREW LOCK CORT STAR 3.5X14 (Screw) ×4 IMPLANT
SCREW LOCK CORT STAR 3.5X16 (Screw) ×4 IMPLANT
SCREW LOCK CORT STAR 3.5X20 (Screw) ×2 IMPLANT
SCREW NON LOCKING LP 3.5 16MM (Screw) ×2 IMPLANT
SPLINT PLASTER CAST XFAST 5X30 (CAST SUPPLIES) ×20 IMPLANT
SPLINT PLASTER XFAST SET 5X30 (CAST SUPPLIES) ×20
SPONGE T-LAP 18X18 ~~LOC~~+RFID (SPONGE) ×4 IMPLANT
SPONGE T-LAP 4X18 ~~LOC~~+RFID (SPONGE) ×2 IMPLANT
STAPLER VISISTAT 35W (STAPLE) IMPLANT
STRIP CLOSURE SKIN 1/2X4 (GAUZE/BANDAGES/DRESSINGS) IMPLANT
SUCTION FRAZIER HANDLE 10FR (MISCELLANEOUS) ×2
SUCTION TUBE FRAZIER 10FR DISP (MISCELLANEOUS) ×1 IMPLANT
SUT ETHILON 3 0 PS 1 (SUTURE) ×6 IMPLANT
SUT MNCRL AB 4-0 PS2 18 (SUTURE) ×4 IMPLANT
SUT PROLENE 3 0 PS 2 (SUTURE) ×2 IMPLANT
SUT VIC AB 2-0 CT1 27 (SUTURE) ×4
SUT VIC AB 2-0 CT1 TAPERPNT 27 (SUTURE) ×2 IMPLANT
SUT VIC AB 3-0 PS2 18 (SUTURE) ×2
SUT VIC AB 3-0 PS2 18XBRD (SUTURE) ×1 IMPLANT
SYR CONTROL 10ML LL (SYRINGE) IMPLANT
TOWEL OR 17X26 10 PK STRL BLUE (TOWEL DISPOSABLE) ×2 IMPLANT
WATER STERILE IRR 1000ML POUR (IV SOLUTION) IMPLANT

## 2021-05-07 NOTE — Progress Notes (Signed)
PROGRESS NOTE    Sally Wise  EVO:[id number] DOB: 1945-08-14 DOA: 04/28/2021 PCP: Daisy Floro, MD   Brief Narrative: Sally Wise is a 75 y.o. female with a history of atrial fibrillation, hypothyroidism, depression, chronic diastolic heart failure, mucoid diarrhea. Patient presented after suffering a fall and found to have a left ankle fracture. Orthopedic surgery consulted and plan surgery on 10/24.  She has been medically optimized and Cardiology provided preop cardiac clearance is a low risk.  Communicated with her hematologist who indicates that her thrombocytopenia should not be a limitation for for her upcoming surgery and she is a low bleeding risk.  Currently without acute issues.  Awaiting surgery, now rescheduled for 10/25 afternoon.   Assessment & Plan:   Active Problems:   Hypothyroidism   Morbid obesity (HCC)   PAF (paroxysmal atrial fibrillation) (HCC)   Thrombocytopenia (HCC)   Mucoid diarrhea   Ankle fracture   Chronic diastolic CHF (congestive heart failure) (HCC)   Anemia   Prolonged QT interval   Left ankle fracture (trimalleolar fracture dislocation) - Secondary to fall. Splint placed.  - Orthopedic surgery was consulted. - Surgery was delayed for several days so as to allow for improvement in soft tissue edema around fracture site.  Plan for left ankle ORIF on 10/25.    - She has been medically optimized for surgery.  Cardiology provided preop cardiac clearance as a low risk.  As per hematology, her thrombocytopenia should not be a limitation for for her upcoming surgery and she is a low bleeding risk.  AKI - Baseline creatinine of about 1. Creatinine of 1.36 on admission, up to 1.56.  - Renal ultrasound negative for hydronephrosis. - Not on any nephrotoxic's medication at this time. - Resolved.  Periodically follow BMP  Paroxysmal atrial fibrillation - Not on anticoagulation, this was a shared decision making between patient and her  primary cardiologist.  - CHA2DS2-VASc Score is 4. On diltiazem as an outpatient. - Continue diltiazem  Mucoid diarrhea/constipation - Chronic issue although patient clarifies that she isn't actually having diarrhea.   - GI panel PCR positive for enteropathogenic E. coli a month ago. - GI recommendations (Signed off 10/18): GI pathogen panel, consider probiotics-continue align, outpatient follow-up - Reportedly had sigmoidoscopy with Dr. Matthias Hughs recently. - Now constipated.  Minimal relief with Dulcolax suppository.  Had a good BM after an enema on 10/24.  Continue aggressive bowel regimen.  Hypothyroidism - Patient is on Euthyrox as an outpatient - Continue Synthroid  Chronic diastolic heart failure - Appears euvolemic. Not on lasix as an outpatient.  Thrombocytopenia - Chronic. Patient follows with hematology.   - As per my communication with Dr. Clelia Croft couple days ago, may proceed with surgery and is at low bleeding risk.  Acute on chronic anemia - Patient follows with hematology. Acute anemia of unknown etiology. No obvious evidence of bleeding - Unclear etiology.  Baseline hemoglobin appears to be in the 9-10 g range. - S/p 1 unit PRBC transfusion this admission. - Hemoglobin appropriately improved from 7.5-8.9-8.4-8.4.   - Follow CBC postop.  Elevated INR - Unsure of etiology. AST/ALT normal. ALP elevated. No obvious bleeding.  Elevated alkaline phosphatase - Unsure of etiology. GGT normal. Possible bone etiology but no obvious source.  Prolonged QTc - EKG 10/20 showed QTC of 407 ms.  Potassium and magnesium appropriate.  Functional quadriplegia Patient and family at bedside indicate that patient has had significant lower extremity weakness for almost a year, worsened since her  hospitalization in August 2022 and has since then been using a walker at home.  Has sustained multiple falls.  Seen by PCP and referred to Neurology.  Had an today 10/25 with Dr. Everlena Cooper, Neurology  and advised spouse to have it postponed since she is hospitalized.  She is concerned about neuropathy.  No history of DM.  B12: 3928 this admission. Therapies evaluation postop.  Likely will need SNF. Advised them that this may also be due to deconditioning.  Morbid obesity Body mass index is 45.16 kg/m.   DVT prophylaxis: SCDs Code Status:   Code Status: Full Code Family Communication: None at bedside today. Disposition Plan: Discharge home vs SNF pending orthopedic surgery recommendations/management, PT/OT recommendations.  Will likely need SNF.  Patient however not particularly keen on going to SNF.   Consultants:  Orthopedic surgery Gastroenterology Cardiology  Procedures:  None  Antimicrobials: None    Subjective: Seen this morning prior to surgery.  Had a big BM after enema yesterday.  No complaints reported.  Objective: Vitals:   05/06/21 1956 05/07/21 0518 05/07/21 1411 05/07/21 1433  BP: (!) 130/54 (!) 128/54 135/60   Pulse: 70 67 68   Resp: 18 16 15    Temp: 98.1 F (36.7 C) 97.9 F (36.6 C) 98.6 F (37 C)   TempSrc: Oral Oral Oral   SpO2: 96% 97% 97%   Weight:    134.7 kg  Height:    5\' 8"  (1.727 m)    Intake/Output Summary (Last 24 hours) at 05/07/2021 1501 Last data filed at 05/07/2021 0913 Gross per 24 hour  Intake 236 ml  Output 501 ml  Net -265 ml    Filed Weights   04/28/21 1518 05/07/21 1433  Weight: 134.7 kg 134.7 kg    Examination:   General exam: Elderly female, moderately built and obese lying comfortably propped up in bed without distress. Respiratory system: Clear to auscultation.  No increased work of breathing. Cardiovascular system: S1 and S2 heard, RRR.  No JVD, murmurs or pedal edema. Gastrointestinal system: Nondistended, soft and nontender.  Normal bowel sounds heard. Central nervous system: Alert and oriented. No focal neurological deficits. Musculoskeletal: Left lower extremity splint intact.  Able to wiggle her toes  which look normal in color.  No change in status Skin: No cyanosis. No rashes Psychiatry: Judgement and insight appear normal. Mood & affect appropriate.     Data Reviewed: I have personally reviewed following labs and imaging studies  CBC Lab Results  Component Value Date   WBC 5.5 05/07/2021   RBC 2.88 (L) 05/07/2021   HGB 8.4 (L) 05/07/2021   HCT 27.2 (L) 05/07/2021   MCV 94.4 05/07/2021   MCH 29.2 05/07/2021   PLT 100 (L) 05/07/2021   MCHC 30.9 05/07/2021   RDW 19.5 (H) 05/07/2021   LYMPHSABS 1.2 04/28/2021   MONOABS 1.0 04/28/2021   EOSABS 0.3 04/28/2021   BASOSABS 0.0 04/28/2021     Last metabolic panel Lab Results  Component Value Date   NA 135 05/07/2021   K 5.0 05/07/2021   CL 102 05/07/2021   CO2 25 05/07/2021   BUN 30 (H) 05/07/2021   CREATININE 1.02 (H) 05/07/2021   GLUCOSE 89 05/07/2021   GFRNONAA 57 (L) 05/07/2021   GFRAA 80 (L) 08/05/2013   CALCIUM 8.2 (L) 05/07/2021   PHOS 3.9 04/28/2021   PROT 5.8 (L) 04/30/2021   ALBUMIN 2.5 (L) 04/30/2021   BILITOT 0.4 04/30/2021   ALKPHOS 240 (H) 04/30/2021   AST 18 04/30/2021  ALT 12 04/30/2021   ANIONGAP 8 05/07/2021    CBG (last 3)  No results for input(s): GLUCAP in the last 72 hours.   GFR: Estimated Creatinine Clearance: 67.2 mL/min (A) (by C-G formula based on SCr of 1.02 mg/dL (H)).  Coagulation Profile: No results for input(s): INR, PROTIME in the last 168 hours.   Recent Results (from the past 240 hour(s))  Resp Panel by RT-PCR (Flu A&B, Covid) Nasopharyngeal Swab     Status: None   Collection Time: 04/28/21  7:21 PM   Specimen: Nasopharyngeal Swab; Nasopharyngeal(NP) swabs in vial transport medium  Result Value Ref Range Status   SARS Coronavirus 2 by RT PCR NEGATIVE NEGATIVE Final    Comment: (NOTE) SARS-CoV-2 target nucleic acids are NOT DETECTED.  The SARS-CoV-2 RNA is generally detectable in upper respiratory specimens during the acute phase of infection. The  lowest concentration of SARS-CoV-2 viral copies this assay can detect is 138 copies/mL. A negative result does not preclude SARS-Cov-2 infection and should not be used as the sole basis for treatment or other patient management decisions. A negative result may occur with  improper specimen collection/handling, submission of specimen other than nasopharyngeal swab, presence of viral mutation(s) within the areas targeted by this assay, and inadequate number of viral copies(<138 copies/mL). A negative result must be combined with clinical observations, patient history, and epidemiological information. The expected result is Negative.  Fact Sheet for Patients:  BloggerCourse.com  Fact Sheet for Healthcare Providers:  SeriousBroker.it  This test is no t yet approved or cleared by the [place] FDA and  has been authorized for detection and/or diagnosis of SARS-CoV-2 by FDA under an Emergency Use Authorization (EUA). This EUA will remain  in effect (meaning this test can be used) for the duration of the COVID-19 declaration under Section 564(b)(1) of the Act, 21 U.S.C.section 360bbb-3(b)(1), unless the authorization is terminated  or revoked sooner.       Influenza A by PCR NEGATIVE NEGATIVE Final   Influenza B by PCR NEGATIVE NEGATIVE Final    Comment: (NOTE) The Xpert Xpress SARS-CoV-2/FLU/RSV plus assay is intended as an aid in the diagnosis of influenza from Nasopharyngeal swab specimens and should not be used as a sole basis for treatment. Nasal washings and aspirates are unacceptable for Xpert Xpress SARS-CoV-2/FLU/RSV testing.  Fact Sheet for Patients: BloggerCourse.com  Fact Sheet for Healthcare Providers: SeriousBroker.it  This test is not yet approved or cleared by the [place] FDA and has been authorized for detection and/or diagnosis of SARS-CoV-2 by FDA under  an Emergency Use Authorization (EUA). This EUA will remain in effect (meaning this test can be used) for the duration of the COVID-19 declaration under Section 564(b)(1) of the Act, 21 U.S.C. section 360bbb-3(b)(1), unless the authorization is terminated or revoked.  Performed at [facility], [address], [place], [place] [address]   Surgical pcr screen     Status: None   Collection Time: 05/07/21  1:09 AM   Specimen: Nasal Mucosa; Nasal Swab  Result Value Ref Range Status   MRSA, PCR NEGATIVE NEGATIVE Final   Staphylococcus aureus NEGATIVE NEGATIVE Final    Comment: (NOTE) The Xpert SA Assay (FDA approved for NASAL specimens in patients 39 years of age and older), is one component of a comprehensive surveillance program. It is not intended to diagnose infection nor to guide or monitor treatment. Performed at [facility], [address] [place], [place] [address]  Radiology Studies: No results found.      Scheduled Meds:  [MAR Hold] sodium chloride   Intravenous Once   [MAR Hold] busPIRone  15 mg Oral BID   [MAR Hold] diltiazem  180 mg Oral Daily   fentaNYL (SUBLIMAZE) injection  50-100 mcg Intravenous UD   influenza vaccine adjuvanted  0.5 mL Intramuscular Tomorrow-1000   [MAR Hold] levothyroxine  112 mcg Oral Q0600   midazolam  1-2 mg Intravenous UD   [MAR Hold] multivitamin with minerals  1 tablet Oral Daily   [MAR Hold] polyethylene glycol  17 g Oral Daily   [MAR Hold] Ensure Max Protein  11 oz Oral BID   [MAR Hold] senna  2 tablet Oral Daily   [MAR Hold] sertraline  50 mg Oral Daily   Continuous Infusions:  sodium chloride 75 mL/hr at 05/07/21 0608    ceFAZolin (ANCEF) IV     [MAR Hold] methocarbamol (ROBAXIN) IV       LOS: 9 days    Marcellus Scott, MD, [place], [facility]. [facility]  To contact the attending provider between 7A-7P or the covering provider during after hours 7P-7A, please log into  the web site [email or web address] and access using universal [place] password for that web site. If you do not have the password, please call the hospital operator.

## 2021-05-07 NOTE — Progress Notes (Signed)
Nutrition Follow-up  DOCUMENTATION CODES:   Morbid obesity  INTERVENTION:   -Ensure MAX Protein po BID, each supplement provides 150 kcal and 30 grams of protein   -Multivitamin with minerals daily  NUTRITION DIAGNOSIS:   Increased nutrient needs related to diarrhea (ankle fracture) as evidenced by estimated needs.  Ongoing.  GOAL:   Patient will meet greater than or equal to 90% of their needs  Progressing.  MONITOR:   PO intake, Supplement acceptance, Labs, Weight trends, I & O's  ASSESSMENT:   75 y.o. female with medical history significant of a.fib, synthroid, depression, HLD, HTN, obesity, chronic diastolic CHF   Admitted for  Left ankle fracture and AKI  Patient in OR for ORIF of ankle fracture. NPO today for procedure. Has been consuming 25-85% of meals. Accepting Ensure Max, will continue after surgery.  Admission weight: 297 lbs No new weights this admission.  I/Os: +1.2L since admit UOP: 1.5L x 24 hrs  Medications: Miralax, Senokot, Multivitamin with minerals daily  Labs reviewed.  Diet Order:   Diet Order             Diet NPO time specified  Diet effective midnight                   EDUCATION NEEDS:   Education needs have been addressed  Skin:  Skin Assessment: Reviewed RN Assessment  Last BM:  10/25 -type 7  Height:   Ht Readings from Last 1 Encounters:  05/07/21 5\' 8"  (1.727 m)    Weight:   Wt Readings from Last 1 Encounters:  05/07/21 134.7 kg    BMI:  Body mass index is 45.16 kg/m.  Estimated Nutritional Needs:   Kcal:  2100-2300  Protein:  95-110g  Fluid:  2L/day  05/09/21, MS, RD, LDN Inpatient Clinical Dietitian Contact information available via Amion

## 2021-05-07 NOTE — Transfer of Care (Signed)
Immediate Anesthesia Transfer of Care Note  Patient: Sally Wise  Procedure(s) Performed: OPEN REDUCTION INTERNAL FIXATION (ORIF) ANKLE FRACTURE (Left: Ankle)  Patient Location: PACU  Anesthesia Type:General  Level of Consciousness: sedated, patient cooperative and responds to stimulation  Airway & Oxygen Therapy: Patient Spontanous Breathing and Patient connected to face mask oxygen  Post-op Assessment: Report given to RN and Post -op Vital signs reviewed and stable  Post vital signs: Reviewed and stable  Last Vitals:  Vitals Value Taken Time  BP 140/83 05/07/21 1810  Temp    Pulse 80 05/07/21 1814  Resp 22 05/07/21 1814  SpO2 100 % 05/07/21 1814  Vitals shown include unvalidated device data.  Last Pain:  Vitals:   05/07/21 1515  TempSrc:   PainSc: 0-No pain      Patients Stated Pain Goal: 4 (05/06/21 1725)  Complications: No notable events documented.

## 2021-05-07 NOTE — Anesthesia Procedure Notes (Signed)
Procedure Name: Intubation Date/Time: 05/07/2021 4:35 PM Performed by: Niel Hummer, CRNA Pre-anesthesia Checklist: Patient identified, Emergency Drugs available, Suction available and Patient being monitored Patient Re-evaluated:Patient Re-evaluated prior to induction Oxygen Delivery Method: Circle system utilized Preoxygenation: Pre-oxygenation with 100% oxygen Induction Type: IV induction Ventilation: Mask ventilation without difficulty Laryngoscope Size: Mac and 4 Grade View: Grade I Tube type: Oral Tube size: 7.0 mm Number of attempts: 1 Airway Equipment and Method: Stylet Placement Confirmation: ETT inserted through vocal cords under direct vision, breath sounds checked- equal and bilateral and positive ETCO2 Secured at: 22 cm Tube secured with: Tape Dental Injury: Teeth and Oropharynx as per pre-operative assessment

## 2021-05-07 NOTE — Op Note (Signed)
05/07/2021  6:03 PM  PATIENT:  Sally Wise  75 y.o. female  PRE-OPERATIVE DIAGNOSIS: Left ankle trimalleolar fracture dislocation  POST-OPERATIVE DIAGNOSIS: Same  Procedure(s): 1.  Open treatment left ankle trimalleolar fracture with internal fixation including fixation of the posterior lip 2.  AP, lateral and mortise radiographs of the left ankle  SURGEON:  Toni Arthurs, MD  ASSISTANT: Alfredo Martinez, PA-C  ANESTHESIA:   General, regional  EBL:  minimal   TOURNIQUET:   Total Tourniquet Time Documented: Calf (Left) - 3 minutes Calf (Left) - 6 minutes Total: Calf (Left) - 9 minutes  COMPLICATIONS:  None apparent  DISPOSITION:  Extubated, awake and stable to recovery.  INDICATION FOR PROCEDURE: The patient is a 75 year old female with a past medical history significant for morbid obesity who fell late last week injuring her left ankle.  She sustained a trimalleolar fracture dislocation.  She underwent closed reduction in the emergency room and was splinted.  She presents now for operative treatment of this displaced and unstable left ankle injury.  The risks and benefits of the alternative treatment options have been discussed in detail.  The patient wishes to proceed with surgery and specifically understands risks of bleeding, infection, nerve damage, blood clots, need for additional surgery, amputation and death.   PROCEDURE IN DETAIL:  After pre operative consent was obtained, and the correct operative site was identified, the patient was brought to the operating room and placed supine on the OR table.  Anesthesia was administered.  Pre-operative antibiotics were administered.  A surgical timeout was taken.  The left lower extremity was prepped and draped in standard sterile fashion with a tourniquet around the Calf.  The extremity was elevated and the tourniquet inflated to 200 mmHg.  A longitudinal incision was made over the lateral malleolus.  Dissection was carried down  through the subcutaneous tissues to the fracture site.  The tourniquet was ineffective and was released.  Hemostasis was achieved.  The patient's bone quality was noted to be exceptionally poor.  A 7 hole Zimmer Biomet Alps locking plate was used as a reduction aid.  It was provisionally pinned to the proximal and distal fragments.  A tenaculum was then placed around the fracture site securing it to the plate.  The plate was then secured to the fibula distally with 3 locking screws.  Proximally the plate was pulled down to bone with a nonlocking screw.  The remaining 2 proximal holes in the plate were filled with bicortical locking screws.  AP, oblique and lateral radiographs confirmed appropriate reduction of the fibular fracture and appropriate position and length of all hardware.  A lateral view was then obtained.  A pointed Weber tenaculum was used to reduce the posterior malleolus fracture and compressed it appropriately.  A K wire was then inserted from anterior to posterior across the fracture site.  A 4 mm partially-threaded cannulated screw was inserted across the fracture site.  It was noted to secure the posterior malleolus fracture appropriately.  Attention was turned to the medial ankle.  An incision was made around the medial contusion.  Dissection was carried down through the subcutaneous tissues.  Hemostasis was achieved.  The medial malleolus fracture was reduced and held with a pointed tenaculum.  2 K wires were inserted across the fracture site.  4 mm x 40 mm partially-threaded cannulated screws were inserted.  AP, lateral and oblique radiographs of the right ankle were obtained.  These show appropriate reduction of all 3 fractures in appropriate  position and length of all hardware.  The wounds were then irrigated copiously and sprinkled with vancomycin powder.  Subcutaneous tissues were approximated with 2-0 nylon inverted simple sutures.  Skin incisions were closed with horizontal  mattress sutures of 3-0 nylon.  Sterile dressings were applied followed by a well-padded short leg splint.  The patient was awakened from anesthesia and transported to the recovery room in stable condition.   FOLLOW UP PLAN: Nonweightbearing on the left lower extremity for 6 to 8 weeks.  Physical therapy and Occupational Therapy consultations for discharge planning.  The patient will be returned to the hospitalist inpatient service for further care of her underlying medical conditions.   RADIOGRAPHS: AP, mortise and lateral radiographs of the left ankle are obtained intraoperatively.  These show interval reduction and fixation of the trimalleolar fracture.  Hardware is appropriately positioned and of the appropriate lengths.  No other acute injuries are noted.    Alfredo Martinez PA-C was present and scrubbed for the duration of the operative case. His assistance was essential in positioning the patient, prepping and draping, gaining and maintaining exposure, performing the operation, closing and dressing the wounds and applying the splint.

## 2021-05-07 NOTE — Progress Notes (Signed)
Assisted Dr. Bass with left, ultrasound guided, popliteal/saphenous block. Side rails up, monitors on throughout procedure. See vital signs in flow sheet. Tolerated Procedure well. 

## 2021-05-07 NOTE — Interval H&P Note (Signed)
History and Physical Interval Note:  05/07/2021 3:55 PM  Sally Wise  has presented today for surgery, with the diagnosis of Closed trimalleolar fracture of left ankle.  The various methods of treatment have been discussed with the patient and family. After consideration of risks, benefits and other options for treatment, the patient has consented to  Procedure(s): OPEN REDUCTION INTERNAL FIXATION (ORIF) ANKLE FRACTURE (Left) as a surgical intervention.  The patient's history has been reviewed, patient examined, no change in status, stable for surgery.  I have reviewed the patient's chart and labs.  Questions were answered to the patient's satisfaction.     Toni Arthurs  The risks and benefits of the alternative treatment options have been discussed in detail.  The patient wishes to proceed with surgery and specifically understands risks of bleeding, infection, nerve damage, blood clots, need for additional surgery, amputation and death.

## 2021-05-07 NOTE — Discharge Instructions (Signed)
John Hewitt, MD [facility]  Please read the following information regarding your care after surgery.  Medications  You only need a prescription for the narcotic pain medicine (ex. oxycodone, Percocet, Norco).  All of the other medicines listed below are available over the counter. X Aleve 2 pills twice a day for the first 3 days after surgery. X acetominophen (Tylenol) 650 mg every 4-6 hours as you need for minor to moderate pain X oxycodone as prescribed for severe pain  Narcotic pain medicine (ex. oxycodone, Percocet, Vicodin) will cause constipation.  To prevent this problem, take the following medicines while you are taking any pain medicine. X docusate sodium (Colace) 100 mg twice a day X senna (Senokot) 2 tablets twice a day  X To help prevent blood clots, take Xarelto as prescribed for two weeks after surgery.  You should also get up every hour while you are awake to move around.    Weight Bearing X Do not bear any weight on the operated leg or foot.  Cast / Splint / Dressing X Keep your splint, cast or dressing clean and dry.  Don't put anything (coat hanger, pencil, etc) down inside of it.  If it gets damp, use a hair dryer on the cool setting to dry it.  If it gets soaked, call the office to schedule an appointment for a cast change.   After your dressing, cast or splint is removed; you may shower, but do not soak or scrub the wound.  Allow the water to run over it, and then gently pat it dry.  Swelling It is normal for you to have swelling where you had surgery.  To reduce swelling and pain, keep your toes above your nose for at least 3 days after surgery.  It may be necessary to keep your foot or leg elevated for several weeks.  If it hurts, it should be elevated.  Follow Up Call my office at [phone] when you are discharged from the hospital or surgery center to schedule an appointment to be seen two weeks after surgery.  Call my office at [phone] if you develop  a fever >101.5 F, nausea, vomiting, bleeding from the surgical site or severe pain.     

## 2021-05-07 NOTE — Anesthesia Preprocedure Evaluation (Addendum)
Anesthesia Evaluation  Patient identified by MRN, date of birth, ID band Patient awake    Reviewed: Allergy & Precautions, NPO status , Patient's Chart, lab work & pertinent test results  Airway Mallampati: II  TM Distance: >3 FB Neck ROM: Full    Dental no notable dental hx.    Pulmonary pneumonia, resolved,    Pulmonary exam normal breath sounds clear to auscultation       Cardiovascular Exercise Tolerance: Poor METS: < 3 Mets +CHF  Normal cardiovascular exam Rhythm:Regular Rate:Normal  05/02/21 EKG: Normal sinus rhythm Low voltage QRS Borderline ECG   Neuro/Psych negative neurological ROS  negative psych ROS   GI/Hepatic negative GI ROS, Neg liver ROS,   Endo/Other  Hypothyroidism Morbid obesity  Renal/GU CRF and Renal InsufficiencyRenal disease  negative genitourinary   Musculoskeletal negative musculoskeletal ROS (+)   Abdominal Normal abdominal exam  (+)   Peds negative pediatric ROS (+)  Hematology  (+) anemia , Thrombocytopenia Anemia, last transfused this admission on 10/16. Needs new type and screen   Anesthesia Other Findings   Reproductive/Obstetrics negative OB ROS                           Anesthesia Physical Anesthesia Plan  ASA: 4  Anesthesia Plan: General   Post-op Pain Management:  Regional for Post-op pain and GA combined w/ Regional for post-op pain   Induction: Intravenous  PONV Risk Score and Plan: 3 and Treatment may vary due to age or medical condition, Ondansetron and Dexamethasone  Airway Management Planned: Oral ETT  Additional Equipment: None  Intra-op Plan:   Post-operative Plan: Extubation in OR  Informed Consent: I have reviewed the patients History and Physical, chart, labs and discussed the procedure including the risks, benefits and alternatives for the proposed anesthesia with the patient or authorized representative who has indicated  his/her understanding and acceptance.     Dental advisory given  Plan Discussed with: Anesthesiologist, CRNA and Surgeon  Anesthesia Plan Comments: (Popliteal for postop pain control. GETA. Tanna Furry, MD  )      Anesthesia Quick Evaluation

## 2021-05-08 DIAGNOSIS — E039 Hypothyroidism, unspecified: Secondary | ICD-10-CM | POA: Diagnosis not present

## 2021-05-08 DIAGNOSIS — S82892A Other fracture of left lower leg, initial encounter for closed fracture: Secondary | ICD-10-CM | POA: Diagnosis not present

## 2021-05-08 DIAGNOSIS — I480 Paroxysmal atrial fibrillation: Secondary | ICD-10-CM | POA: Diagnosis not present

## 2021-05-08 LAB — BASIC METABOLIC PANEL
Anion gap: 9 (ref 5–15)
BUN: 27 mg/dL — ABNORMAL HIGH (ref 8–23)
CO2: 22 mmol/L (ref 22–32)
Calcium: 8.2 mg/dL — ABNORMAL LOW (ref 8.9–10.3)
Chloride: 102 mmol/L (ref 98–111)
Creatinine, Ser: 0.95 mg/dL (ref 0.44–1.00)
GFR, Estimated: >60 mL/min (ref >60)
Glucose, Bld: 114 mg/dL — ABNORMAL HIGH (ref 70–99)
Potassium: 5 mmol/L (ref 3.5–5.1)
Sodium: 133 mmol/L — ABNORMAL LOW (ref 135–145)

## 2021-05-08 LAB — CBC
HCT: 25.8 % — ABNORMAL LOW (ref 36.0–46.0)
Hemoglobin: 7.8 g/dL — ABNORMAL LOW (ref 12.0–15.0)
MCH: 29.2 pg (ref 26.0–34.0)
MCHC: 30.2 g/dL (ref 30.0–36.0)
MCV: 96.6 fL (ref 80.0–100.0)
Platelets: 98 10*3/uL — ABNORMAL LOW (ref 150–400)
RBC: 2.67 MIL/uL — ABNORMAL LOW (ref 3.87–5.11)
RDW: 19.6 % — ABNORMAL HIGH (ref 11.5–15.5)
WBC: 3.8 10*3/uL — ABNORMAL LOW (ref 4.0–10.5)
nRBC: 0 % (ref 0.0–0.2)

## 2021-05-08 MED ORDER — OXYCODONE HCL 5 MG PO TABS: 5.0000 mg | ORAL_TABLET | ORAL | 0 refills | Status: AC | PRN

## 2021-05-08 MED ORDER — DOCUSATE SODIUM 100 MG PO CAPS: 100.0000 mg | ORAL_CAPSULE | Freq: Two times a day (BID) | ORAL | 0 refills | Status: DC

## 2021-05-08 MED ORDER — RIVAROXABAN 10 MG PO TABS: 10.0000 mg | ORAL_TABLET | Freq: Every day | ORAL | 0 refills | Status: DC

## 2021-05-08 MED ORDER — MELATONIN 3 MG PO TABS
3.0000 mg | ORAL_TABLET | Freq: Every day | ORAL | Status: DC
  Administered 2021-05-08 – 2021-05-12 (×5): 3 mg via ORAL
  Filled 2021-05-08 (×5): qty 1

## 2021-05-08 MED ORDER — SENNA 8.6 MG PO TABS: 2.0000 | ORAL_TABLET | Freq: Two times a day (BID) | ORAL | 0 refills | Status: DC

## 2021-05-08 MED ORDER — LOPERAMIDE HCL 2 MG PO CAPS
4.0000 mg | ORAL_CAPSULE | ORAL | Status: DC | PRN
  Administered 2021-05-08 – 2021-05-12 (×2): 4 mg via ORAL
  Filled 2021-05-08 (×2): qty 2

## 2021-05-08 NOTE — TOC Initial Note (Signed)
Transition of Care [facility]) - Initial/Assessment Note    Patient Details  Name: Sally Wise MRN: [id number] Date of Birth: [birth year]  [facility] CM/SW Contact:    Ida Rogue, LCSW Phone Number: 05/08/2021, 11:53 AM  Clinical Narrative:   Patient seen in follow up to PT recommendation of SNF/24hr supervision.  Sally Wise lives here in [place] with her husband, has no offspring who are able to stay with them to help post d/c, and is open to considering going to short term rehab.  Bed search process explained and initiated. TOC will continue to follow during the course of hospitalization.             Expected Discharge Plan: Skilled Nursing Facility Barriers to Discharge: SNF Pending bed offer   Patient Goals and CMS Choice     Choice offered to / list presented to : Patient, Spouse  Expected Discharge Plan and Services Expected Discharge Plan: Skilled Nursing Facility   Discharge Planning Services: CM Consult Post Acute Care Choice: Skilled Nursing Facility Living arrangements for the past 2 months: Single Family Home                                      Prior Living Arrangements/Services Living arrangements for the past 2 months: Single Family Home Lives with:: Spouse Patient language and need for interpreter reviewed:: Yes        Need for Family Participation in Patient Care: Yes (Comment) Care giver support system in place?: Yes (comment)   Criminal Activity/Legal Involvement Pertinent to Current Situation/Hospitalization: No - Comment as needed  Activities of Daily Living Home Assistive Devices/Equipment: Eyeglasses, Walker (specify type) ADL Screening (condition at time of admission) Patient's cognitive ability adequate to safely complete daily activities?: Yes Is the patient deaf or have difficulty hearing?: No Does the patient have difficulty seeing, even when wearing glasses/contacts?: No Does the patient have difficulty  concentrating, remembering, or making decisions?: No Patient able to express need for assistance with ADLs?: Yes Does the patient have difficulty dressing or bathing?: No Independently performs ADLs?: No Does the patient have difficulty walking or climbing stairs?: Yes Weakness of Legs: Both Weakness of Arms/Hands: None  Permission Sought/Granted Permission sought to share information with : Family Supports Permission granted to share information with : Yes, Verbal Permission Granted  Share Information with NAME: Sally, Wise (Spouse)   [phone]           Emotional Assessment Appearance:: Appears stated age Attitude/Demeanor/Rapport: Engaged Affect (typically observed): Appropriate Orientation: : Oriented to Self, Oriented to Place, Oriented to Situation Alcohol / Substance Use: Not Applicable Psych Involvement: No (comment)  Admission diagnosis:  Ankle fracture [S82.899A] General weakness [R53.1] AKI (acute kidney injury) (HCC) [N17.9] Closed trimalleolar fracture of left ankle, initial encounter [X41.287O] Elevated alkaline phosphatase level [R74.8] Severe comorbid illness [R69] 1 gun in home [Z91.89] Patient Active Problem List   Diagnosis Date Noted   Ankle fracture 04/28/2021   Chronic diastolic CHF (congestive heart failure) (HCC) 04/28/2021   Anemia 04/28/2021   Prolonged QT interval 04/28/2021   Mucoid diarrhea 03/16/2021   Diarrhea 03/14/2021   Acute on chronic diastolic HF (heart failure) (HCC) 08/24/2020   Body mass index (BMI) 50.0-59.9, adult (HCC) 08/24/2020   Chronic kidney disease with active medical management without dialysis, stage 3 (moderate) (HCC) 08/24/2020   Combined rheumatic disorders of mitral, 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  Congestive rheumatic heart failure (HCC) 08/24/2020   Hardening of the aorta (main artery of the heart) (HCC) 08/24/2020   Kidney stone 08/24/2020   Pure hypercholesterolemia 08/24/2020   Scoliosis of  thoracic spine 08/24/2020   Solitary pulmonary nodule 08/24/2020   Other long term (current) drug therapy 08/24/2020   Abnormal findings on diagnostic imaging of other specified body structures 08/24/2020   Acute respiratory failure with hypoxia (HCC) 06/04/2020   Multifocal pneumonia 06/03/2020   Low back pain 12/12/2019   History of total knee replacement, 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   Lichen sclerosus et atrophicus 01/04/2015   LVH (left ventricular hypertrophy)    PAF (paroxysmal atrial fibrillation) (HCC) 07/06/2014   Thrombocytopenia (HCC) 07/06/2014   Hyperglycemia 07/06/2014   Morbid obesity (HCC) 01/25/2013   Hypothyroidism 01/15/2013   History of total knee replacement, left 08/09/2007   PCP:  Daisy Floro, MD Pharmacy:   [facility] [address] [place] - [address] [place] [address] Phone: [phone] Fax: [phone]     Social Determinants of Health (SDOH) Interventions    Readmission Risk Interventions Readmission Risk Prevention Plan 06/08/2020  Post Dischage Appt Complete  Medication Screening Complete  Transportation Screening Complete  Some recent data might be hidden

## 2021-05-08 NOTE — Evaluation (Signed)
Occupational Therapy Evaluation Patient Details Name: Sally Wise MRN: [id number] DOB: May 11, 1946 Today's Date: 05/08/2021   History of Present Illness The patient is a 75 year old female with a past medical history significant for morbid obesity who fell late last week injuring her left ankle.  She sustained a trimalleolar fracture dislocation.  She underwent closed reduction in the emergency room and was splinted. S ? ORIF 05/07/21. PMH:PAF,CHF,B TKA,hypothyroidism   Clinical Impression   Patient is a 75 year old female who was admitted for above. Patient was noted to have NWB LLE, decreased functional activity tolerance, decreased endurance, decreased standing, decreased knowledge of DME impacting patients ability to engage in ADLs. Patient would continue to benefit from skilled OT services at this time while admitted and after d/c to address noted deficits in order to improve overall safety and independence in ADLs.   Patients blood pressures: Supine in bed: 140/63 mmhg Chair position in bed: 148/76 mmhg Sitting EOB: 164/94 mmhg with dizziness reported with increased time sitting up.      Recommendations for follow up therapy are one component of a multi-disciplinary discharge planning process, led by the attending physician.  Recommendations may be updated based on patient status, additional functional criteria and insurance authorization.   Follow Up Recommendations  Skilled nursing-short term rehab (<3 hours/day)    Assistance Recommended at Discharge Frequent or constant Supervision/Assistance  Functional Status Assessment  Patient has had a recent decline in their functional status and/or demonstrates limited ability to make significant improvements in function in a reasonable and predictable amount of time  Equipment Recommendations  BSC;Wheelchair (measurements OT)    Recommendations for Other Services       Precautions / Restrictions Precautions Precautions:  Fall Restrictions Weight Bearing Restrictions: Yes LLE Weight Bearing: Non weight bearing      Mobility Bed Mobility Overal bed mobility: Needs Assistance Bed Mobility: Supine to Sit;Sit to Supine     Supine to sit: Mod assist;+2 for physical assistance;+2 for safety/equipment Sit to supine: Mod assist;+2 for safety/equipment;+2 for physical assistance   General bed mobility comments: patient was able to move BLE over bed but needed physical assistance for trunk to midline and scooting to edge of bed. patient needed physical assist to get BLE back in bed.    Transfers Overall transfer level: Needs assistance                 General transfer comment: will need lift curently      Balance Overall balance assessment: Needs assistance Sitting-balance support: Bilateral upper extremity supported;Feet supported Sitting balance-Leahy Scale: Good Sitting balance - Comments: patient able to sit unassisted       Standing balance comment: Not able                           ADL either performed or assessed with clinical judgement   ADL Overall ADL's : Needs assistance/impaired Eating/Feeding: Modified independent;Sitting   Grooming: Wash/dry face;Wash/dry hands;Sitting;Set up;Brushing hair Grooming Details (indicate cue type and reason): patient needed encouragement to participate in combing hair sitting EOB Upper Body Bathing: Sitting;Minimal assistance Upper Body Bathing Details (indicate cue type and reason): patient declined to participate in washing up at this time Lower Body Bathing: Total assistance;Bed level   Upper Body Dressing : Sitting;Minimal assistance   Lower Body Dressing: Bed level;Total assistance   Toilet Transfer: +2 for safety/equipment;+2 for physical assistance;Total assistance Toilet Transfer Details (indicate cue type and reason): standing was  not attempted on this date for safety Toileting- Clothing Manipulation and Hygiene: Total  assistance;Bed level               Vision Patient Visual Report: No change from baseline       Perception     Praxis      Pertinent Vitals/Pain Pain Assessment: Faces Faces Pain Scale: Hurts even more Pain Location: periarea. Pain Descriptors / Indicators: Burning Pain Intervention(s): Limited activity within patient's tolerance;Repositioned     Hand Dominance Right   Extremity/Trunk Assessment Upper Extremity Assessment Upper Extremity Assessment: Overall WFL for tasks assessed (patient was able to assist with reaching arms over head in bed and pull herself up.)   Lower Extremity Assessment Lower Extremity Assessment: Defer to PT evaluation RLE Deficits / Details: grossly 3/5 LLE Deficits / Details: in SL spint   Cervical / Trunk Assessment Cervical / Trunk Assessment: Normal   Communication Communication Communication: No difficulties   Cognition Arousal/Alertness: Awake/alert Behavior During Therapy: WFL for tasks assessed/performed Overall Cognitive Status: Within Functional Limits for tasks assessed                                       General Comments       Exercises     Shoulder Instructions      Home Living Family/patient expects to be discharged to:: Private residence Living Arrangements: Spouse/significant other Available Help at Discharge: Family Type of Home: House Home Access: Ramped entrance     Home Layout: One level     Bathroom Shower/Tub: Producer, television/film/video: Handicapped height Bathroom Accessibility: Yes How Accessible: Accessible via walker Home Equipment: Cane - single point;Rolling Walker (2 wheels);Grab bars - tub/shower   Additional Comments: has pillows on chairs to raise height      Prior Functioning/Environment Prior Level of Function : Needs assist       Physical Assist : Mobility (physical);ADLs (physical) Mobility (physical): Bed mobility;Transfers;Gait   Mobility Comments:  uses RW ADLs Comments: patient reported sponge bathing at home. patient reported being able to complete all ADLs herself at [facility] level at home. patient reported if she needed help her husband was present        OT Problem List: Decreased strength;Decreased activity tolerance;Impaired balance (sitting and/or standing);Decreased safety awareness;Pain;Obesity;Decreased knowledge of precautions;Decreased knowledge of use of DME or AE      OT Treatment/Interventions: Self-care/ADL training;Therapeutic exercise;Neuromuscular education;Energy conservation;Therapeutic activities;DME and/or AE instruction;Balance training;Patient/family education    OT Goals(Current goals can be found in the care plan section) Acute Rehab OT Goals Patient Stated Goal: to get better OT Goal Formulation: With patient Time For Goal Achievement: 05/22/21 Potential to Achieve Goals: Good  OT Frequency: Min 2X/week   Barriers to D/C:    patient has steps to enter house.       Co-evaluation PT/OT/SLP Co-Evaluation/Treatment: Yes Reason for Co-Treatment: For patient/therapist safety PT goals addressed during session: Mobility/safety with mobility OT goals addressed during session: ADL's and self-care      AM-PAC OT "6 Clicks" Daily Activity     Outcome Measure Help from another person eating meals?: None Help from another person taking care of personal grooming?: A Little Help from another person toileting, which includes using toliet, bedpan, or urinal?: Total Help from another person bathing (including washing, rinsing, drying)?: Total Help from another person to put on and taking off regular upper body clothing?:  A Lot Help from another person to put on and taking off regular lower body clothing?: Total 6 Click Score: 12   End of Session    Activity Tolerance: Patient tolerated treatment well Patient left: in bed;with call bell/phone within reach;with family/visitor present  OT Visit Diagnosis:  Unsteadiness on feet (R26.81);Muscle weakness (generalized) (M62.81);History of falling (Z91.81)                Time: 0915-1000 OT Time Calculation (min): 45 min Charges:  OT General Charges $OT Visit: 1 Visit OT Evaluation $OT Eval Moderate Complexity: 1 Mod  Sharyn Blitz OTR/L, MS Acute Rehabilitation Department Office# [phone] Pager# [phone]   [facility] Guest Delancey Moraes 05/08/2021, 1:18 PM

## 2021-05-08 NOTE — Evaluation (Signed)
Physical Therapy Evaluation Patient Details Name: Sally Wise MRN: [id number] DOB: 05-30-1946 Today's Date: 05/08/2021  History of Present Illness  The patient is a 75 year old female with a past medical history significant for morbid obesity who fell late last week injuring her left ankle.  She sustained a trimalleolar fracture dislocation.  She underwent closed reduction in the emergency room and was splinted. S/ ORIF 05/07/21. PMH:PAF,CHF,B TKA,hypothyroidism  Clinical Impression  The patient   Did tolerate sitting  in bed /chair position, BP stable not dizziness. Progressed to  mobilize to sitting on bed edge with mod assistance. Able to sit without support, does not report any L ankle pain. Max assist for legs back onto bed  then sidelying for a BM.  Patient currently will be training  with scooting transfers ,being NWB on the Left  foot. OPTA was limited in ambulation, reports much difficulty with sit to stand.  Pt admitted with above diagnosis.  Pt currently with functional limitations due to the deficits listed below (see PT Problem List). Pt will benefit from skilled PT to increase their independence and safety with mobility to allow discharge to the venue listed below.        Recommendations for follow up therapy are one component of a multi-disciplinary discharge planning process, led by the attending physician.  Recommendations may be updated based on patient status, additional functional criteria and insurance authorization.  Follow Up Recommendations Skilled nursing-short term rehab (<3 hours/day)    Assistance Recommended at Discharge    Functional Status Assessment Patient has had a recent decline in their functional status and demonstrates the ability to make significant improvements in function in a reasonable and predictable amount of time.  Equipment Recommendations  None recommended by PT    Recommendations for Other Services       Precautions / Restrictions  Precautions Precautions: Fall Restrictions LLE Weight Bearing: Non weight bearing      Mobility  Bed Mobility Overal bed mobility: Needs Assistance Bed Mobility: Supine to Sit;Sit to Supine     Supine to sit: Mod assist;+2 for physical assistance;+2 for safety/equipment Sit to supine: Mod assist;+2 for safety/equipment;+2 for physical assistance   General bed mobility comments: Patient able to move legs over to bed edge, mod assist to raise trunk. Assist for legs back onto bed to sidelying. Patient stating that she needs a Bm.    Transfers                   General transfer comment: will need lift curently    Ambulation/Gait                Stairs            Wheelchair Mobility    Modified Rankin (Stroke Patients Only)       Balance Overall balance assessment: Needs assistance Sitting-balance support: Bilateral upper extremity supported;Feet supported Sitting balance-Leahy Scale: Good Sitting balance - Comments: patient able to sit unassisted       Standing balance comment: Not able                             Pertinent Vitals/Pain Pain Assessment: Faces Faces Pain Scale: Hurts even more Pain Location: periarea. Pain Descriptors / Indicators: Burning Pain Intervention(s): Monitored during session;Repositioned    Home Living Family/patient expects to be discharged to:: Private residence Living Arrangements: Spouse/significant other Available Help at Discharge: Family Type of Home: House Home Access:  Ramped entrance       Home Layout: One level Home Equipment: Cane - single point;Rolling Walker (2 wheels);Grab bars - tub/shower Additional Comments: has pillows on chairs to raise height    Prior Function Prior Level of Function : Needs assist       Physical Assist : Mobility (physical);ADLs (physical) Mobility (physical): Bed mobility;Transfers;Gait   Mobility Comments: uses RW ADLs Comments: sponge bath.      Hand Dominance   Dominant Hand: Right    Extremity/Trunk Assessment        Lower Extremity Assessment Lower Extremity Assessment: LLE deficits/detail;RLE deficits/detail RLE Deficits / Details: grossly 3/5 LLE Deficits / Details: in SL spint    Cervical / Trunk Assessment Cervical / Trunk Assessment: Normal  Communication   Communication: No difficulties  Cognition Arousal/Alertness: Awake/alert Behavior During Therapy: WFL for tasks assessed/performed Overall Cognitive Status: Within Functional Limits for tasks assessed                                          General Comments      Exercises     Assessment/Plan    PT Assessment Patient needs continued PT services  PT Problem List Decreased strength;Decreased mobility;Decreased activity tolerance;Decreased knowledge of use of DME;Impaired sensation;Decreased knowledge of precautions       PT Treatment Interventions      PT Goals (Current goals can be found in the Care Plan section)  Acute Rehab PT Goals Patient Stated Goal: to be able to walk PT Goal Formulation: With patient/family Time For Goal Achievement: 05/22/21 Potential to Achieve Goals: Fair    Frequency Min 3X/week   Barriers to discharge        Co-evaluation PT/OT/SLP Co-Evaluation/Treatment: Yes Reason for Co-Treatment: For patient/therapist safety PT goals addressed during session: Mobility/safety with mobility OT goals addressed during session: ADL's and self-care       AM-PAC PT "6 Clicks" Mobility  Outcome Measure Help needed turning from your back to your side while in a flat bed without using bedrails?: A Lot Help needed moving from lying on your back to sitting on the side of a flat bed without using bedrails?: A Lot Help needed moving to and from a bed to a chair (including a wheelchair)?: Total Help needed standing up from a chair using your arms (e.g., wheelchair or bedside chair)?: Total Help needed to  walk in hospital room?: Total Help needed climbing 3-5 steps with a railing? : Total 6 Click Score: 8    End of Session   Activity Tolerance: Patient tolerated treatment well Patient left: in bed;with call bell/phone within reach Nurse Communication: Mobility status PT Visit Diagnosis: Unsteadiness on feet (R26.81);History of falling (Z91.81)    Time: 0915-1002 PT Time Calculation (min) (ACUTE ONLY): 47 min   Charges:   PT Evaluation $PT Eval Low Complexity: 1 Low PT Treatments $Therapeutic Activity: 8-22 mins        Blanchard Kelch PT Acute Rehabilitation Services Pager [phone] Office [phone]   Rada Hay 05/08/2021, 12:57 PM

## 2021-05-08 NOTE — Progress Notes (Addendum)
PROGRESS NOTE    Sally Wise  LSL:[id number] DOB: 1946/06/06 DOA: 04/28/2021 PCP: Daisy Floro, MD   Brief Narrative: Sally Wise is a 75 y.o. female with a history of atrial fibrillation, hypothyroidism, depression, diastolic heart failure, mucoid diarrhea. Patient presented after suffering a fall and found to have a left ankle fracture. Orthopedic surgery consulted.   Assessment & Plan:   Active Problems:   Hypothyroidism   Morbid obesity (HCC)   PAF (paroxysmal atrial fibrillation) (HCC)   Thrombocytopenia (HCC)   Mucoid diarrhea   Ankle fracture   Chronic diastolic CHF (congestive heart failure) (HCC)   Anemia   Prolonged QT interval   Left ankle fracture Secondary to fall. Splint placed. Orthopedic surgery consulted on admission. ORIF performed on 10/25. -Orthopedic surgery recommendations: nonweightbearing of LLE, Lovenox > Xarelto, 2 week follow-up  AKI Baseline creatinine of about 1. Creatinine of 1.36 on admission, up to 1.56 at peak. Resolved with IV fluids.  Paroxysmal atrial fibrillation Not on anticoagulation. CHA2DS2-VASc Score is 4. On diltiazem as an outpatient. -Continue diltiazem  Mucoid diarrhea Chronic issue although patient clarifies that she isn't actually having diarrhea. GI consulted and signed off 10/18.  Hypothyroidism Patient is on Euthyrox as an outpatient -Continue Synthroid  Chronic diastolic heart failure Appears euvolemic. Not on lasix as an outpatient.  Thrombocytopenia Chronic. Patient follows with hematology.  Acute on chronic anemia Patient follows with hematology. Acute anemia of unknown etiology. No obvious evidence of bleeding. Patient received a 1 unit PRBC transfusion on 10/19 for hemoglobin 7.5. -CBC in AM  Elevated INR Unsure of etiology. AST/ALT normal. ALP elevated. No obvious bleeding.  Elevated alkaline phosphatase Unsure of etiology. GGT normal. Possible bone etiology but no obvious source.  Stabilized.  Prolonged QTc Noted.  Morbid obesity Body mass index is 45.16 kg/m.   DVT prophylaxis: SCDs Code Status:   Code Status: Full Code Family Communication: None at bedside Disposition Plan: Discharge home vs SNF pending PT/OT recommendations   Consultants:  Orthopedic surgery Gastroenterology  Procedures:  None  Antimicrobials: None    Subjective: No concerns overnight.  Objective: Vitals:   05/08/21 0650 05/08/21 0900 05/08/21 1137 05/08/21 1315  BP: 132/61 140/63 (!) 145/61 (!) 141/60  Pulse: 71  75 78  Resp: 20  20 20   Temp: 98 F (36.7 C)  (!) 97.5 F (36.4 C) 97.9 F (36.6 C)  TempSrc: Oral  Oral Oral  SpO2: 98%  95% 98%  Weight:      Height:        Intake/Output Summary (Last 24 hours) at 05/08/2021 1421 Last data filed at 05/08/2021 0700 Gross per 24 hour  Intake 2675.03 ml  Output 950 ml  Net 1725.03 ml   Filed Weights   04/28/21 1518 05/07/21 1433  Weight: 134.7 kg 134.7 kg    Examination:  General exam: Appears calm and comfortable Respiratory system: Clear to auscultation. Respiratory effort normal. Cardiovascular system: S1 & S2 heard, RRR. No murmurs, rubs, gallops or clicks. Gastrointestinal system: Abdomen is nondistended, soft and nontender. No organomegaly or masses felt. Normal bowel sounds heard. Central nervous system: Alert and oriented. No focal neurological deficits. Musculoskeletal: No edema. No calf tenderness. Left leg with splint Skin: No cyanosis. No rashes Psychiatry: Judgement and insight appear normal. Mood & affect appropriate.     Data Reviewed: I have personally reviewed following labs and imaging studies  CBC Lab Results  Component Value Date   WBC 3.8 (L) 05/08/2021   RBC  2.67 (L) 05/08/2021   HGB 7.8 (L) 05/08/2021   HCT 25.8 (L) 05/08/2021   MCV 96.6 05/08/2021   MCH 29.2 05/08/2021   PLT 98 (L) 05/08/2021   MCHC 30.2 05/08/2021   RDW 19.6 (H) 05/08/2021   LYMPHSABS 1.2 04/28/2021    MONOABS 1.0 04/28/2021   EOSABS 0.3 04/28/2021   BASOSABS 0.0 04/28/2021     Last metabolic panel Lab Results  Component Value Date   NA 133 (L) 05/08/2021   K 5.0 05/08/2021   CL 102 05/08/2021   CO2 22 05/08/2021   BUN 27 (H) 05/08/2021   CREATININE 0.95 05/08/2021   GLUCOSE 114 (H) 05/08/2021   GFRNONAA >60 05/08/2021   GFRAA 80 (L) 08/05/2013   CALCIUM 8.2 (L) 05/08/2021   PHOS 3.9 04/28/2021   PROT 5.8 (L) 04/30/2021   ALBUMIN 2.5 (L) 04/30/2021   BILITOT 0.4 04/30/2021   ALKPHOS 240 (H) 04/30/2021   AST 18 04/30/2021   ALT 12 04/30/2021   ANIONGAP 9 05/08/2021    CBG (last 3)  No results for input(s): GLUCAP in the last 72 hours.   GFR: Estimated Creatinine Clearance: 72.2 mL/min (by C-G formula based on SCr of 0.95 mg/dL).  Coagulation Profile: No results for input(s): INR, PROTIME in the last 168 hours.  Recent Results (from the past 240 hour(s))  Resp Panel by RT-PCR (Flu A&B, Covid) Nasopharyngeal Swab     Status: None   Collection Time: 04/28/21  7:21 PM   Specimen: Nasopharyngeal Swab; Nasopharyngeal(NP) swabs in vial transport medium  Result Value Ref Range Status   SARS Coronavirus 2 by RT PCR NEGATIVE NEGATIVE Final    Comment: (NOTE) SARS-CoV-2 target nucleic acids are NOT DETECTED.  The SARS-CoV-2 RNA is generally detectable in upper respiratory specimens during the acute phase of infection. The lowest concentration of SARS-CoV-2 viral copies this assay can detect is 138 copies/mL. A negative result does not preclude SARS-Cov-2 infection and should not be used as the sole basis for treatment or other patient management decisions. A negative result may occur with  improper specimen collection/handling, submission of specimen other than nasopharyngeal swab, presence of viral mutation(s) within the areas targeted by this assay, and inadequate number of viral copies(<138 copies/mL). A negative result must be combined with clinical observations,  patient history, and epidemiological information. The expected result is Negative.  Fact Sheet for Patients:  BloggerCourse.com  Fact Sheet for Healthcare Providers:  SeriousBroker.it  This test is no t yet approved or cleared by the [place] FDA and  has been authorized for detection and/or diagnosis of SARS-CoV-2 by FDA under an Emergency Use Authorization (EUA). This EUA will remain  in effect (meaning this test can be used) for the duration of the COVID-19 declaration under Section 564(b)(1) of the Act, 21 U.S.C.section 360bbb-3(b)(1), unless the authorization is terminated  or revoked sooner.       Influenza A by PCR NEGATIVE NEGATIVE Final   Influenza B by PCR NEGATIVE NEGATIVE Final    Comment: (NOTE) The Xpert Xpress SARS-CoV-2/FLU/RSV plus assay is intended as an aid in the diagnosis of influenza from Nasopharyngeal swab specimens and should not be used as a sole basis for treatment. Nasal washings and aspirates are unacceptable for Xpert Xpress SARS-CoV-2/FLU/RSV testing.  Fact Sheet for Patients: BloggerCourse.com  Fact Sheet for Healthcare Providers: SeriousBroker.it  This test is not yet approved or cleared by the [place] FDA and has been authorized for detection and/or diagnosis of SARS-CoV-2 by FDA under  an Emergency Use Authorization (EUA). This EUA will remain in effect (meaning this test can be used) for the duration of the COVID-19 declaration under Section 564(b)(1) of the Act, 21 U.S.C. section 360bbb-3(b)(1), unless the authorization is terminated or revoked.  Performed at [facility], [address], [place], [place] [address]   Surgical pcr screen     Status: None   Collection Time: 05/07/21  1:09 AM   Specimen: Nasal Mucosa; Nasal Swab  Result Value Ref Range Status   MRSA, PCR NEGATIVE NEGATIVE Final    Staphylococcus aureus NEGATIVE NEGATIVE Final    Comment: (NOTE) The Xpert SA Assay (FDA approved for NASAL specimens in patients 50 years of age and older), is one component of a comprehensive surveillance program. It is not intended to diagnose infection nor to guide or monitor treatment. Performed at [facility], [address], [place], [place] [address]         Radiology Studies: No results found.      Scheduled Meds:  sodium chloride   Intravenous Once   busPIRone  15 mg Oral BID   diltiazem  180 mg Oral Daily   docusate sodium  100 mg Oral BID   enoxaparin (LOVENOX) injection  40 mg Subcutaneous Q24H   influenza vaccine adjuvanted  0.5 mL Intramuscular Tomorrow-1000   levothyroxine  112 mcg Oral Q0600   multivitamin with minerals  1 tablet Oral Daily   Ensure Max Protein  11 oz Oral BID   senna  1 tablet Oral BID   sertraline  50 mg Oral Daily   Continuous Infusions:  methocarbamol (ROBAXIN) IV       LOS: 10 days     Jacquelin Hawking, MD [facility] 05/08/2021, 2:21 PM  If 7PM-7AM, please contact night-coverage [email or web address]

## 2021-05-08 NOTE — Progress Notes (Signed)
Orthopedic Tech Progress Note Patient Details:  Sally Wise 02/12/1946 [id number]  Patient ID: [name], female   DOB: Jun 19, 1946, 75 y.o.   MRN: [id number] Removed Pts overhead frame while she was in short stay on 10/25, but she is not supposed to have one, and I am unsure of who set it up for her. Due to her age, I would not recommend setting it up again. Darleen Crocker 05/08/2021, 9:59 AM

## 2021-05-08 NOTE — NC FL2 (Signed)
[place] MEDICAID FL2 LEVEL OF CARE SCREENING TOOL     IDENTIFICATION  Patient Name: Sally Wise Birthdate: 1945-07-17 Sex: female Admission Date (Current Location): 04/28/2021  [facility] and [place] Number:  Producer, television/film/video and Address:  [facility],  [address] [place], [place] [address]      Provider Number: [id number]  Attending Physician Name and Address:  Narda Bonds, MD  Relative Name and Phone Number:  [place], [facility])   [phone]    Current Level of Care: Hospital Recommended Level of Care: Skilled Nursing Facility Prior Approval Number:    Date Approved/Denied:   PASRR Number: [id number]  Discharge Plan: SNF    Current Diagnoses: Patient Active Problem List   Diagnosis Date Noted   Ankle fracture 04/28/2021   Chronic diastolic CHF (congestive heart failure) (HCC) 04/28/2021   Anemia 04/28/2021   Prolonged QT interval 04/28/2021   Mucoid diarrhea 03/16/2021   Diarrhea 03/14/2021   Acute on chronic diastolic HF (heart failure) (HCC) 08/24/2020   Body mass index (BMI) 50.0-59.9, adult (HCC) 08/24/2020   Chronic kidney disease with active medical management without dialysis, stage 3 (moderate) (HCC) 08/24/2020   Combined rheumatic disorders of mitral, 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   Congestive rheumatic heart failure (HCC) 08/24/2020   Hardening of the aorta (main artery of the heart) (HCC) 08/24/2020   Kidney stone 08/24/2020   Pure hypercholesterolemia 08/24/2020   Scoliosis of thoracic spine 08/24/2020   Solitary pulmonary nodule 08/24/2020   Other long term (current) drug therapy 08/24/2020   Abnormal findings on diagnostic imaging of other specified body structures 08/24/2020   Acute respiratory failure with hypoxia (HCC) 06/04/2020   Multifocal pneumonia 06/03/2020   Low back pain 12/12/2019   History of total knee replacement, 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   Lichen sclerosus et atrophicus 01/04/2015   LVH (left  ventricular hypertrophy)    PAF (paroxysmal atrial fibrillation) (HCC) 07/06/2014   Thrombocytopenia (HCC) 07/06/2014   Hyperglycemia 07/06/2014   Morbid obesity (HCC) 01/25/2013   Hypothyroidism 01/15/2013   History of total knee replacement, left 08/09/2007    Orientation RESPIRATION BLADDER Height & Weight     Self, Time, Situation, Place  Normal External catheter Weight: 134.7 kg Height:  5\' 8"  (172.7 cm)  BEHAVIORAL SYMPTOMS/MOOD NEUROLOGICAL BOWEL NUTRITION STATUS      Incontinent Diet (see d/c summary)  AMBULATORY STATUS COMMUNICATION OF NEEDS Skin   Extensive Assist Verbally Surgical wounds (L Ankle)                       Personal Care Assistance Level of Assistance  Bathing, Feeding, Dressing Bathing Assistance: Maximum assistance Feeding assistance: Independent Dressing Assistance: Limited assistance     Functional Limitations Info  Sight, Hearing, Speech Sight Info: Adequate Hearing Info: Adequate Speech Info: Adequate    SPECIAL CARE FACTORS FREQUENCY  PT (By licensed PT), OT (By licensed OT)     PT Frequency: 5X/W OT Frequency: 5X/W            Contractures Contractures Info: Not present    Additional Factors Info  Code Status, Allergies Code Status Info: Full Allergies Info: NKA           Current Medications (05/08/2021):  This is the current hospital active medication list Current Facility-Administered Medications  Medication Dose Route Frequency Provider Last Rate Last Admin   0.9 %  sodium chloride infusion (Manually program via Guardrails IV Fluids)   Intravenous Once 05/10/2021,  PA-C       acetaminophen (TYLENOL) tablet 325-650 mg  325-650 mg Oral Q6H PRN Jacinta Shoe, PA-C       busPIRone (BUSPAR) tablet 15 mg  15 mg Oral BID Jacinta Shoe, [place]   15 mg at 05/08/21 1044   diltiazem (CARDIZEM CD) 24 hr capsule 180 mg  180 mg Oral Daily Jacinta Shoe, [place]   180 mg at 05/08/21 1044   diphenhydrAMINE (BENADRYL)  12.5 MG/5ML elixir 12.5-25 mg  12.5-25 mg Oral Q4H PRN Jacinta Shoe, PA-C       docusate sodium (COLACE) capsule 100 mg  100 mg Oral BID Jacinta Shoe, PA-C       enoxaparin (LOVENOX) injection 40 mg  40 mg Subcutaneous Q24H Alfredo Martinez [place], [place]   40 mg at 05/08/21 1043   HYDROmorphone (DILAUDID) injection 0.5-1 mg  0.5-1 mg Intravenous Q4H PRN Jacinta Shoe, PA-C   0.5 mg at 05/07/21 2022   influenza vaccine adjuvanted (FLUAD) injection 0.5 mL  0.5 mL Intramuscular Tomorrow-1000 Therisa Doyne, MD       levothyroxine (SYNTHROID) tablet 112 mcg  112 mcg Oral Q0600 Jacinta Shoe, [place]   112 mcg at 05/08/21 0544   loperamide (IMODIUM) capsule 4 mg  4 mg Oral PRN Narda Bonds, MD   4 mg at 05/08/21 1239   methocarbamol (ROBAXIN) tablet 500 mg  500 mg Oral Q6H PRN Jacinta Shoe, PA-C   500 mg at 05/06/21 1725   Or   methocarbamol (ROBAXIN) 500 mg in dextrose 5 % 50 mL IVPB  500 mg Intravenous Q6H PRN Jacinta Shoe, PA-C       metoCLOPramide (REGLAN) tablet 5-10 mg  5-10 mg Oral Q8H PRN Jacinta Shoe, PA-C       Or   metoCLOPramide (REGLAN) injection 5-10 mg  5-10 mg Intravenous Q8H PRN Jacinta Shoe, PA-C       multivitamin with minerals tablet 1 tablet  1 tablet Oral Daily Jacinta Shoe, [place]   1 tablet at 05/08/21 1044   oxyCODONE (Oxy IR/ROXICODONE) immediate release tablet 10-15 mg  10-15 mg Oral Q4H PRN Jacinta Shoe, PA-C       oxyCODONE (Oxy IR/ROXICODONE) immediate release tablet 5-10 mg  5-10 mg Oral Q4H PRN Jacinta Shoe, PA-C   5 mg at 05/08/21 1239   protein supplement (ENSURE MAX) liquid  11 oz Oral BID Jacinta Shoe, [place]   11 oz at 05/06/21 1118   senna (SENOKOT) tablet 8.6 mg  1 tablet Oral BID Jacinta Shoe, PA-C       sertraline (ZOLOFT) tablet 50 mg  50 mg Oral Daily Jacinta Shoe, PA-C   50 mg at 05/08/21 1044     Discharge Medications: Please see discharge summary for a list of discharge  medications.  Relevant Imaging Results:  Relevant Lab Results:   Additional Information [phone] [place], [place]

## 2021-05-08 NOTE — Progress Notes (Signed)
Subjective: 1 Day Post-Op Procedure(s) (LRB): OPEN REDUCTION INTERNAL FIXATION (ORIF) ANKLE FRACTURE (Left)  Patient reports pain as mild to moderate.  Denies fever, chills, N/V, CP, SOB.  Reports that she is ready for breakfast.  Admits to flatus.  Notes that she wants to be D/C'd home.  Objective:   VITALS:  Temp:  [97.3 F (36.3 C)-98.6 F (37 C)] 98 F (36.7 C) (10/26 0650) Pulse Rate:  [66-82] 71 (10/26 0650) Resp:  [13-23] 20 (10/26 0650) BP: (89-149)/(60-88) 132/61 (10/26 0650) SpO2:  [94 %-100 %] 98 % (10/26 0650) Weight:  [134.7 kg] 134.7 kg (10/25 1433)  General: WDWN patient in NAD. Psych:  Appropriate mood and affect. Neuro:  A&O x 3, Moving all extremities, sensation intact to light touch HEENT:  EOMs intact Chest:  Even non-labored respirations Skin:  SLS C/D/I, no rashes or lesions Extremities: warm/dry, no visible edema, erythema or echymosis.  No lymphadenopathy. Pulses: Popliteus 2+ MSK:  ROM: EHL/FHL intact, MMT: able to perform quad set   LABS Recent Labs    05/07/21 0540 05/08/21 0512  HGB 8.4* 7.8*  WBC 5.5 3.8*  PLT 100* 98*   Recent Labs    05/07/21 0540 05/08/21 0512  NA 135 133*  K 5.0 5.0  CL 102 102  CO2 25 22  BUN 30* 27*  CREATININE 1.02* 0.95  GLUCOSE 89 114*   No results for input(s): LABPT, INR in the last 72 hours.   Assessment/Plan: 1 Day Post-Op Procedure(s) (LRB): OPEN REDUCTION INTERNAL FIXATION (ORIF) ANKLE FRACTURE (Left)  NWB L LE Up with therapy DVT ppx: lovenox in house.  Transition to Xarelto upon D/C D/C scripts on chart.  After searching [place] PMP Aware the patient is provided a Rx for oxycodone. Disp:  Pending; likely SNF Plan for 2 week outpatient post-op visit with Dr. Victorino Dike.  Alfredo Martinez PA-C [facility] Office:  [phone]

## 2021-05-09 ENCOUNTER — Encounter: Payer: Medicare Other | Admitting: Physical Therapy

## 2021-05-09 DIAGNOSIS — S82892A Other fracture of left lower leg, initial encounter for closed fracture: Secondary | ICD-10-CM | POA: Diagnosis not present

## 2021-05-09 DIAGNOSIS — E039 Hypothyroidism, unspecified: Secondary | ICD-10-CM | POA: Diagnosis not present

## 2021-05-09 DIAGNOSIS — I480 Paroxysmal atrial fibrillation: Secondary | ICD-10-CM | POA: Diagnosis not present

## 2021-05-09 LAB — RETICULOCYTES
Immature Retic Fract: 29 % — ABNORMAL HIGH (ref 2.3–15.9)
RBC.: 2.31 MIL/uL — ABNORMAL LOW (ref 3.87–5.11)
Retic Count, Absolute: 67.2 10*3/uL (ref 19.0–186.0)
Retic Ct Pct: 2.9 % (ref 0.4–3.1)

## 2021-05-09 LAB — CBC
HCT: 21.9 % — ABNORMAL LOW (ref 36.0–46.0)
Hemoglobin: 6.7 g/dL — CL (ref 12.0–15.0)
MCH: 29.4 pg (ref 26.0–34.0)
MCHC: 30.6 g/dL (ref 30.0–36.0)
MCV: 96.1 fL (ref 80.0–100.0)
Platelets: 92 10*3/uL — ABNORMAL LOW (ref 150–400)
RBC: 2.28 MIL/uL — ABNORMAL LOW (ref 3.87–5.11)
RDW: 19.5 % — ABNORMAL HIGH (ref 11.5–15.5)
WBC: 5.7 10*3/uL (ref 4.0–10.5)
nRBC: 0 % (ref 0.0–0.2)

## 2021-05-09 LAB — BASIC METABOLIC PANEL
Anion gap: 6 (ref 5–15)
BUN: 30 mg/dL — ABNORMAL HIGH (ref 8–23)
CO2: 25 mmol/L (ref 22–32)
Calcium: 8.2 mg/dL — ABNORMAL LOW (ref 8.9–10.3)
Chloride: 106 mmol/L (ref 98–111)
Creatinine, Ser: 1.08 mg/dL — ABNORMAL HIGH (ref 0.44–1.00)
GFR, Estimated: 53 mL/min — ABNORMAL LOW (ref >60)
Glucose, Bld: 88 mg/dL (ref 70–99)
Potassium: 4.4 mmol/L (ref 3.5–5.1)
Sodium: 137 mmol/L (ref 135–145)

## 2021-05-09 LAB — HEMOGLOBIN AND HEMATOCRIT, BLOOD
HCT: 28.4 % — ABNORMAL LOW (ref 36.0–46.0)
Hemoglobin: 8.8 g/dL — ABNORMAL LOW (ref 12.0–15.0)

## 2021-05-09 LAB — PREPARE RBC (CROSSMATCH)

## 2021-05-09 MED ORDER — SODIUM CHLORIDE 0.9% IV SOLUTION: Freq: Once | INTRAVENOUS | Status: AC

## 2021-05-09 NOTE — Progress Notes (Signed)
Subjective: 2 Days Post-Op Procedure(s) (LRB): OPEN REDUCTION INTERNAL FIXATION (ORIF) ANKLE FRACTURE (Left)  Patient reports pain as mild to moderate.  Tolerating POs well.  Admits to flatus.  Denies fever, chills, N/V, CP, SOB.  Reports that she worked well with therapy.  Accompanied by husband.  Receiving 1 unit PRBCs.  Objective:   VITALS:  Temp:  [97.5 F (36.4 C)-98.2 F (36.8 C)] 98 F (36.7 C) (10/27 0646) Pulse Rate:  [63-78] 63 (10/27 0646) Resp:  [15-20] 20 (10/27 0646) BP: (130-145)/(60-73) 134/60 (10/27 0646) SpO2:  [95 %-100 %] 98 % (10/27 0646)  General: Obese patient in NAD. Psych:  Appropriate mood and affect. Neuro:  A&O x 3, Moving all extremities, sensation intact to light touch HEENT:  EOMs intact Chest:  Even non-labored respirations Skin:  SLS C/D/I, no rashes or lesions Extremities: warm/dry, no visible edema, erythema or echymosis.  No lymphadenopathy. Pulses: Popliteus 2+ MSK:  ROM: EHL/FHL, MMT: able to perform quad set   LABS Recent Labs    05/07/21 0540 05/08/21 0512 05/09/21 0507  HGB 8.4* 7.8* 6.7*  WBC 5.5 3.8* 5.7  PLT 100* 98* 92*   Recent Labs    05/08/21 0512 05/09/21 0507  NA 133* 137  K 5.0 4.4  CL 102 106  CO2 22 25  BUN 27* 30*  CREATININE 0.95 1.08*  GLUCOSE 114* 88   No results for input(s): LABPT, INR in the last 72 hours.   Assessment/Plan: 2 Days Post-Op Procedure(s) (LRB): OPEN REDUCTION INTERNAL FIXATION (ORIF) ANKLE FRACTURE (Left)  NWB L LE Up with therapy DVT ppx:  lovenox in house; transition to Xarelto up on D/C D/C scripts on chart Disp:  SNF Plan for 2 week outpatient post-op visit.  Alfredo Martinez PA-C [facility] Office:  [phone]

## 2021-05-09 NOTE — Progress Notes (Signed)
CRITICAL VALUE STICKER  CRITICAL VALUE:hgb 6.7  RECEIVER Sally Wise  DATE & TIME NOTIFIED: 05/09/21 0615  MESSENGER (representative from lab):  MD NOTIFIED: Garner Nash  TIME OF NOTIFICATION:0615  RESPONSE:  1unit prbc

## 2021-05-09 NOTE — Progress Notes (Signed)
PROGRESS NOTE    Sally Wise  KGM:[id number] DOB: March 01, 1946 DOA: 04/28/2021 PCP: Daisy Floro, MD   Brief Narrative: Sally Wise is a 75 y.o. female with a history of atrial fibrillation, hypothyroidism, depression, diastolic heart failure, mucoid diarrhea. Patient presented after suffering a fall and found to have a left ankle fracture. Orthopedic surgery consulted.   Assessment & Plan:   Active Problems:   Hypothyroidism   Morbid obesity (HCC)   PAF (paroxysmal atrial fibrillation) (HCC)   Thrombocytopenia (HCC)   Mucoid diarrhea   Ankle fracture   Chronic diastolic CHF (congestive heart failure) (HCC)   Anemia   Prolonged QT interval   Left ankle fracture Secondary to fall. Splint placed. Orthopedic surgery consulted on admission. ORIF performed on 10/25. -Orthopedic surgery recommendations: nonweightbearing of LLE, Lovenox > Xarelto, 2 week follow-up  AKI Baseline creatinine of about 1. Creatinine of 1.36 on admission, up to 1.56 at peak. Resolved with IV fluids.  Paroxysmal atrial fibrillation Not on anticoagulation. CHA2DS2-VASc Score is 4. On diltiazem as an outpatient. -Continue diltiazem  Mucoid diarrhea Chronic issue although patient clarifies that she isn't actually having diarrhea. GI consulted and signed off 10/18.  Hypothyroidism Patient is on Euthyrox as an outpatient -Continue Synthroid  Chronic diastolic heart failure Appears euvolemic. Not on lasix as an outpatient.  Thrombocytopenia Chronic. Patient follows with hematology.  Acute on chronic anemia Patient follows with hematology. Acute anemia of unknown etiology but may be related to fracture/surgery. Patient received a 1 unit PRBC transfusion on 10/19 for hemoglobin 7.5. Patient with recurrent drop in  hemoglobin down to 6.7 on 10/27 requiring transfusion of 1 unit of PRBC -Reticulocyte count -CBC in AM  Elevated INR Unsure of etiology. AST/ALT normal. ALP elevated. No  obvious bleeding.  Elevated alkaline phosphatase Unsure of etiology. GGT normal. Possible bone etiology but no obvious source. Stabilized.  Prolonged QTc Noted.  Morbid obesity Body mass index is 45.16 kg/m.   DVT prophylaxis: SCDs, Lovenox Code Status:   Code Status: Full Code Family Communication: Husband at bedside Disposition Plan: Discharge to SNF likely in 2-3 days pending stable hemoglobin   Consultants:  Orthopedic surgery Gastroenterology  Procedures:  ORTHOPEDIC SURGERY (05/07/2021) OPEN TREATMENT LEFT ANKLE TRIMALLEOLAR FRACTURE WITH INTERNAL FIXATION INCLUDING FIXATION OF THE POSTERIOR LIP AP, LATERAL AND MORTISE RADIOGRAPHS OF THE LEFT ANKLE  Antimicrobials: None    Subjective: No concerns overnight.  Objective: Vitals:   05/09/21 0526 05/09/21 0620 05/09/21 0646 05/09/21 1000  BP: 139/73 137/63 134/60 130/62  Pulse: 63 64 63 72  Resp: 16 20 20 18   Temp: 97.9 F (36.6 C) 97.6 F (36.4 C) 98 F (36.7 C) 98.2 F (36.8 C)  TempSrc: Oral Oral Oral Oral  SpO2: 98% 97% 98% 99%  Weight:      Height:        Intake/Output Summary (Last 24 hours) at 05/09/2021 1458 Last data filed at 05/09/2021 1000 Gross per 24 hour  Intake 1153.33 ml  Output --  Net 1153.33 ml    Filed Weights   04/28/21 1518 05/07/21 1433  Weight: 134.7 kg 134.7 kg    Examination:  General exam: Appears calm and comfortable Respiratory system: Clear to auscultation. Respiratory effort normal. Cardiovascular system: S1 & S2 heard, RRR. No murmurs, rubs, gallops or clicks. Gastrointestinal system: Abdomen is nondistended, soft and nontender. No organomegaly or masses felt. Normal bowel sounds heard. Central nervous system: Alert and oriented. No focal neurological deficits. Musculoskeletal: Left lower leg in  splint. No calf tenderness Skin: No cyanosis. No rashes Psychiatry: Judgement and insight appear normal. Mood & affect appropriate.     Data Reviewed: I have  personally reviewed following labs and imaging studies  CBC Lab Results  Component Value Date   WBC 5.7 05/09/2021   RBC 2.28 (L) 05/09/2021   HGB 6.7 (LL) 05/09/2021   HCT 21.9 (L) 05/09/2021   MCV 96.1 05/09/2021   MCH 29.4 05/09/2021   PLT 92 (L) 05/09/2021   MCHC 30.6 05/09/2021   RDW 19.5 (H) 05/09/2021   LYMPHSABS 1.2 04/28/2021   MONOABS 1.0 04/28/2021   EOSABS 0.3 04/28/2021   BASOSABS 0.0 04/28/2021     Last metabolic panel Lab Results  Component Value Date   NA 137 05/09/2021   K 4.4 05/09/2021   CL 106 05/09/2021   CO2 25 05/09/2021   BUN 30 (H) 05/09/2021   CREATININE 1.08 (H) 05/09/2021   GLUCOSE 88 05/09/2021   GFRNONAA 53 (L) 05/09/2021   GFRAA 80 (L) 08/05/2013   CALCIUM 8.2 (L) 05/09/2021   PHOS 3.9 04/28/2021   PROT 5.8 (L) 04/30/2021   ALBUMIN 2.5 (L) 04/30/2021   BILITOT 0.4 04/30/2021   ALKPHOS 240 (H) 04/30/2021   AST 18 04/30/2021   ALT 12 04/30/2021   ANIONGAP 6 05/09/2021    CBG (last 3)  No results for input(s): GLUCAP in the last 72 hours.   GFR: Estimated Creatinine Clearance: 63.5 mL/min (A) (by C-G formula based on SCr of 1.08 mg/dL (H)).  Coagulation Profile: No results for input(s): INR, PROTIME in the last 168 hours.  Recent Results (from the past 240 hour(s))  Surgical pcr screen     Status: None   Collection Time: 05/07/21  1:09 AM   Specimen: Nasal Mucosa; Nasal Swab  Result Value Ref Range Status   MRSA, PCR NEGATIVE NEGATIVE Final   Staphylococcus aureus NEGATIVE NEGATIVE Final    Comment: (NOTE) The Xpert SA Assay (FDA approved for NASAL specimens in patients 56 years of age and older), is one component of a comprehensive surveillance program. It is not intended to diagnose infection nor to guide or monitor treatment. Performed at [facility], [address] [place], [place] [address]          Radiology Studies: No results found.      Scheduled Meds:  sodium chloride    Intravenous Once   busPIRone  15 mg Oral BID   diltiazem  180 mg Oral Daily   docusate sodium  100 mg Oral BID   enoxaparin (LOVENOX) injection  40 mg Subcutaneous Q24H   influenza vaccine adjuvanted  0.5 mL Intramuscular Tomorrow-1000   levothyroxine  112 mcg Oral Q0600   melatonin  3 mg Oral QHS   multivitamin with minerals  1 tablet Oral Daily   Ensure Max Protein  11 oz Oral BID   senna  1 tablet Oral BID   sertraline  50 mg Oral Daily   Continuous Infusions:  methocarbamol (ROBAXIN) IV       LOS: 11 days     Jacquelin Hawking, MD [facility] 05/09/2021, 2:58 PM  If 7PM-7AM, please contact night-coverage [email or web address]

## 2021-05-10 DIAGNOSIS — E039 Hypothyroidism, unspecified: Secondary | ICD-10-CM | POA: Diagnosis not present

## 2021-05-10 DIAGNOSIS — S82892A Other fracture of left lower leg, initial encounter for closed fracture: Secondary | ICD-10-CM | POA: Diagnosis not present

## 2021-05-10 DIAGNOSIS — I480 Paroxysmal atrial fibrillation: Secondary | ICD-10-CM | POA: Diagnosis not present

## 2021-05-10 LAB — BPAM RBC
Blood Product Expiration Date: 202211192359
ISSUE DATE / TIME: 202210270627
Unit Type and Rh: 6200

## 2021-05-10 LAB — TYPE AND SCREEN
ABO/RH(D): A POS
Antibody Screen: NEGATIVE
Unit division: 0

## 2021-05-10 LAB — CBC
HCT: 30 % — ABNORMAL LOW (ref 36.0–46.0)
Hemoglobin: 9.2 g/dL — ABNORMAL LOW (ref 12.0–15.0)
MCH: 29.3 pg (ref 26.0–34.0)
MCHC: 30.7 g/dL (ref 30.0–36.0)
MCV: 95.5 fL (ref 80.0–100.0)
Platelets: 95 10*3/uL — ABNORMAL LOW (ref 150–400)
RBC: 3.14 MIL/uL — ABNORMAL LOW (ref 3.87–5.11)
RDW: 19.3 % — ABNORMAL HIGH (ref 11.5–15.5)
WBC: 4.1 10*3/uL (ref 4.0–10.5)
nRBC: 0.5 % — ABNORMAL HIGH (ref 0.0–0.2)

## 2021-05-10 NOTE — Anesthesia Postprocedure Evaluation (Signed)
Anesthesia Post Note  Patient: Sally Wise  Procedure(s) Performed: OPEN REDUCTION INTERNAL FIXATION (ORIF) ANKLE FRACTURE (Left: Ankle)     Patient location during evaluation: PACU Anesthesia Type: General and Regional Level of consciousness: awake Pain management: pain level controlled Vital Signs Assessment: post-procedure vital signs reviewed and stable Respiratory status: spontaneous breathing and respiratory function stable Cardiovascular status: stable Postop Assessment: no apparent nausea or vomiting Anesthetic complications: no   No notable events documented.  Last Vitals:  Vitals:   05/10/21 0447 05/10/21 1019  BP: (!) 138/55 139/64  Pulse: 63 67  Resp: 20   Temp: 36.8 C   SpO2: 95%     Last Pain:  Vitals:   05/10/21 1023  TempSrc:   PainSc: 2                  Mellody Dance

## 2021-05-10 NOTE — Progress Notes (Signed)
Physical Therapy Treatment Patient Details Name: Sally Wise MRN: [id number] DOB: [birth year] Today's Date: 05/10/2021   History of Present Illness The patient is a 75 year old female with a past medical history significant for morbid obesity who fell late last week injuring her left ankle.  She sustained a trimalleolar fracture dislocation.  Pt s/p left ORIF 05/07/21. PMH: PAF, CHF, B TKA, hypothyroidism    PT Comments    Pt assisted with rolling, bed mobility, scooting along EOB in sitting and attempting to work on sit to stands today (however unable to stand).  Pt would benefit from d/c to SNF.   Recommendations for follow up therapy are one component of a multi-disciplinary discharge planning process, led by the attending physician.  Recommendations may be updated based on patient status, additional functional criteria and insurance authorization.  Follow Up Recommendations  Skilled nursing-short term rehab (<3 hours/day)     Assistance Recommended at Discharge    Equipment Recommendations  None recommended by PT    Recommendations for Other Services       Precautions / Restrictions Precautions Precautions: Fall Restrictions Weight Bearing Restrictions: Yes RLE Weight Bearing: Weight bearing as tolerated LLE Weight Bearing: Non weight bearing     Mobility  Bed Mobility Overal bed mobility: Needs Assistance Bed Mobility: Supine to Sit;Rolling Rolling: Max assist;+2 for physical assistance   Supine to sit: Mod assist;+2 for physical assistance;+2 for safety/equipment Sit to supine: Mod assist;+2 for safety/equipment;+2 for physical assistance   General bed mobility comments: pt assisted with rolling for hygiene and linen change due to urine soaked linen (despite purewick in place); pt then assist to sitting EOB and requiring assist for L LE and trunk    Transfers Overall transfer level: Needs assistance Equipment used: Rolling walker (2 wheels) Transfers: Stand  Pivot Transfers   Stand pivot transfers: From elevated surface         General transfer comment: attempted x3 with elevated bed and max cues for positioning and technique; pt very quick to say "I can't" and required encouragement, positive feedback (requested she say "I'll try or I'm trying" instead of flat out I can't); placed pt's L foot on top of therapist's foot so better distinguish maintaining WBing status, pt was able to briefly lift buttocks from bed surface on last attempt; pt also attempted scooting along bed x4 while therapist kept L LE elevated from floor (as pt attempting to push down) and pt not moving significant however was engaging muscles    Ambulation/Gait                 Stairs             Wheelchair Mobility    Modified Rankin (Stroke Patients Only)       Balance Overall balance assessment: Needs assistance Sitting-balance support: Feet supported Sitting balance-Leahy Scale: Good Sitting balance - Comments: patient able to sit unassisted       Standing balance comment: Not able                            Cognition Arousal/Alertness: Awake/alert Behavior During Therapy: Flat affect Overall Cognitive Status: Within Functional Limits for tasks assessed                                 General Comments: pt appears frustrated with lack of mobility and also reports fear of falling  Exercises      General Comments        Pertinent Vitals/Pain Pain Assessment: No/denies pain Pain Intervention(s): Monitored during session;Repositioned    Home Living                          Prior Function            PT Goals (current goals can now be found in the care plan section) Progress towards PT goals: Progressing toward goals    Frequency    Min 2X/week      PT Plan Current plan remains appropriate    Co-evaluation              AM-PAC PT "6 Clicks" Mobility   Outcome Measure   Help needed turning from your back to your side while in a flat bed without using bedrails?: A Lot Help needed moving from lying on your back to sitting on the side of a flat bed without using bedrails?: A Lot Help needed moving to and from a bed to a chair (including a wheelchair)?: Total Help needed standing up from a chair using your arms (e.g., wheelchair or bedside chair)?: Total Help needed to walk in hospital room?: Total Help needed climbing 3-5 steps with a railing? : Total 6 Click Score: 8    End of Session   Activity Tolerance: Patient tolerated treatment well Patient left: in bed;with call bell/phone within reach Nurse Communication: Mobility status PT Visit Diagnosis: Unsteadiness on feet (R26.81);History of falling (Z91.81);Other abnormalities of gait and mobility (R26.89)     Time: 8502-7741 PT Time Calculation (min) (ACUTE ONLY): 32 min  Charges:  $Therapeutic Activity: 23-37 mins                    Thomasene Mohair PT, DPT Acute Rehabilitation Services Pager: [phone] Office: [phone]    Janan Halter [place] 05/10/2021, 3:48 PM

## 2021-05-10 NOTE — Progress Notes (Signed)
Subjective: 3 Days Post-Op Procedure(s) (LRB): OPEN REDUCTION INTERNAL FIXATION (ORIF) ANKLE FRACTURE (Left)  Patient reports pain as mild to moderate.  Denies fever, chills, N/V, CP, SOB.  Tolerating POs well.  Admits to flatus. Resting comfortably in bed.  Objective:   VITALS:  Temp:  [98.1 F (36.7 C)-98.5 F (36.9 C)] 98.2 F (36.8 C) (10/28 0447) Pulse Rate:  [63-76] 63 (10/28 0447) Resp:  [18-20] 20 (10/28 0447) BP: (130-138)/(55-68) 138/55 (10/28 0447) SpO2:  [95 %-99 %] 95 % (10/28 0447)  General: Obese patient in NAD. Psych:  Appropriate mood and affect. Neuro:  A&O x 3, Moving all extremities, sensation intact to light touch HEENT:  EOMs intact Chest:  Even non-labored respirations Skin:  SLS C/D/I, no rashes or lesions Extremities: warm/dry, no visible edema, erythema or echymosis.  No lymphadenopathy. Pulses: Popliteus 2+ MSK:  ROM: EHL/FHL intact, MMT: able to perform quad set   LABS Recent Labs    05/08/21 0512 05/09/21 0507 05/09/21 1852  HGB 7.8* 6.7* 8.8*  WBC 3.8* 5.7  --   PLT 98* 92*  --    Recent Labs    05/08/21 0512 05/09/21 0507  NA 133* 137  K 5.0 4.4  CL 102 106  CO2 22 25  BUN 27* 30*  CREATININE 0.95 1.08*  GLUCOSE 114* 88   No results for input(s): LABPT, INR in the last 72 hours.   Assessment/Plan: 3 Days Post-Op Procedure(s) (LRB): OPEN REDUCTION INTERNAL FIXATION (ORIF) ANKLE FRACTURE (Left)  NWB L LE Up with therapy DVT ppx:  Lovenox in house; transition to Xarelto upon D/C D/C scripts on chart Disp:  SNF Plan for 2 week outpatient post-op visit. Patient is stable from ortho standpoint.  Ortho signing off.  Please call with questions/concerns.  Alfredo Martinez PA-C [facility] Office:  [phone]

## 2021-05-10 NOTE — Progress Notes (Signed)
PROGRESS NOTE    [name]  DJS:[id number] DOB: 1946/03/06 DOA: 04/28/2021 PCP: Daisy Floro, MD   Brief Narrative: Sally Wise is a 75 y.o. female with a history of atrial fibrillation, hypothyroidism, depression, diastolic heart failure, mucoid diarrhea. Patient presented after suffering a fall and found to have a left ankle fracture. Orthopedic surgery consulted.   Assessment & Plan:   Active Problems:   Hypothyroidism   Morbid obesity (HCC)   PAF (paroxysmal atrial fibrillation) (HCC)   Thrombocytopenia (HCC)   Mucoid diarrhea   Ankle fracture   Chronic diastolic CHF (congestive heart failure) (HCC)   Anemia   Prolonged QT interval   Left ankle fracture Secondary to fall. Splint placed. Orthopedic surgery consulted on admission. ORIF performed on 10/25. -Orthopedic surgery recommendations: nonweightbearing of LLE, Lovenox > Xarelto, 2 week follow-up  AKI Baseline creatinine of about 1. Creatinine of 1.36 on admission, up to 1.56 at peak. Resolved with IV fluids.  Paroxysmal atrial fibrillation Not on anticoagulation. CHA2DS2-VASc Score is 4. On diltiazem as an outpatient. -Continue diltiazem  Mucoid diarrhea Chronic issue although patient clarifies that she isn't actually having diarrhea. GI consulted and signed off 10/18.  Hypothyroidism Patient is on Euthyrox as an outpatient -Continue Synthroid  Chronic diastolic heart failure Appears euvolemic. Not on lasix as an outpatient.  Thrombocytopenia Chronic. Patient follows with hematology.  Acute on chronic anemia Patient follows with hematology. Acute anemia of unknown etiology but may be related to fracture/surgery. Patient received a 1 unit PRBC transfusion on 10/19 for hemoglobin 7.5. Patient with recurrent drop in  hemoglobin down to 6.7 on 10/27 requiring transfusion of 1 unit of PRBC with significant improvement to 8.8 > 9.2. Unsure if 6.7 was a true hemoglobin. Still no evidence of  hemorrhage. Reticulocyte count normal  Elevated INR Unsure of etiology. AST/ALT normal. ALP elevated. No obvious bleeding.  Elevated alkaline phosphatase Unsure of etiology. GGT normal. Possible bone etiology but no obvious source. Stabilized.  Prolonged QTc Noted.  Morbid obesity Body mass index is 45.16 kg/m.   DVT prophylaxis: SCDs, Lovenox Code Status:   Code Status: Full Code Family Communication: Husband at bedside Disposition Plan: Discharge to SNF likely in 2-3 days pending stable hemoglobin   Consultants:  Orthopedic surgery Gastroenterology  Procedures:  ORTHOPEDIC SURGERY (05/07/2021) OPEN TREATMENT LEFT ANKLE TRIMALLEOLAR FRACTURE WITH INTERNAL FIXATION INCLUDING FIXATION OF THE POSTERIOR LIP AP, LATERAL AND MORTISE RADIOGRAPHS OF THE LEFT ANKLE  Antimicrobials: None    Subjective: Feeling down because of SNF choices. Wanting to participate in more therapy.  Objective: Vitals:   05/09/21 2102 05/10/21 0447 05/10/21 1019 05/10/21 1300  BP: (!) 135/57 (!) 138/55 139/64 140/70  Pulse: 66 63 67 72  Resp: 20 20  20   Temp: 98.1 F (36.7 C) 98.2 F (36.8 C)  98.5 F (36.9 C)  TempSrc: Oral Oral  Oral  SpO2: 95% 95%  96%  Weight:      Height:        Intake/Output Summary (Last 24 hours) at 05/10/2021 1441 Last data filed at 05/10/2021 0800 Gross per 24 hour  Intake 120 ml  Output 700 ml  Net -580 ml    Filed Weights   04/28/21 1518 05/07/21 1433  Weight: 134.7 kg 134.7 kg    Examination:  General exam: Appears calm and comfortable Respiratory system: Clear to auscultation. Respiratory effort normal. Cardiovascular system: S1 & S2 heard, RRR. Gastrointestinal system: Abdomen is nondistended, soft and nontender. No organomegaly or masses  felt. Normal bowel sounds heard. Central nervous system: Alert and oriented. No focal neurological deficits. Musculoskeletal:  No calf tenderness Skin: No cyanosis. No rashes Psychiatry: Judgement and  insight appear normal. Mood & affect appropriate.     Data Reviewed: I have personally reviewed following labs and imaging studies  CBC Lab Results  Component Value Date   WBC 4.1 05/10/2021   RBC 3.14 (L) 05/10/2021   HGB 9.2 (L) 05/10/2021   HCT 30.0 (L) 05/10/2021   MCV 95.5 05/10/2021   MCH 29.3 05/10/2021   PLT 95 (L) 05/10/2021   MCHC 30.7 05/10/2021   RDW 19.3 (H) 05/10/2021   LYMPHSABS 1.2 04/28/2021   MONOABS 1.0 04/28/2021   EOSABS 0.3 04/28/2021   BASOSABS 0.0 04/28/2021     Last metabolic panel Lab Results  Component Value Date   NA 137 05/09/2021   K 4.4 05/09/2021   CL 106 05/09/2021   CO2 25 05/09/2021   BUN 30 (H) 05/09/2021   CREATININE 1.08 (H) 05/09/2021   GLUCOSE 88 05/09/2021   GFRNONAA 53 (L) 05/09/2021   GFRAA 80 (L) 08/05/2013   CALCIUM 8.2 (L) 05/09/2021   PHOS 3.9 04/28/2021   PROT 5.8 (L) 04/30/2021   ALBUMIN 2.5 (L) 04/30/2021   BILITOT 0.4 04/30/2021   ALKPHOS 240 (H) 04/30/2021   AST 18 04/30/2021   ALT 12 04/30/2021   ANIONGAP 6 05/09/2021    CBG (last 3)  No results for input(s): GLUCAP in the last 72 hours.   GFR: Estimated Creatinine Clearance: 63.5 mL/min (A) (by C-G formula based on SCr of 1.08 mg/dL (H)).  Coagulation Profile: No results for input(s): INR, PROTIME in the last 168 hours.  Recent Results (from the past 240 hour(s))  Surgical pcr screen     Status: None   Collection Time: 05/07/21  1:09 AM   Specimen: Nasal Mucosa; Nasal Swab  Result Value Ref Range Status   MRSA, PCR NEGATIVE NEGATIVE Final   Staphylococcus aureus NEGATIVE NEGATIVE Final    Comment: (NOTE) The Xpert SA Assay (FDA approved for NASAL specimens in patients 67 years of age and older), is one component of a comprehensive surveillance program. It is not intended to diagnose infection nor to guide or monitor treatment. Performed at [facility], [address], [place], [place] [address]          Radiology  Studies: No results found.      Scheduled Meds:  sodium chloride   Intravenous Once   busPIRone  15 mg Oral BID   diltiazem  180 mg Oral Daily   docusate sodium  100 mg Oral BID   enoxaparin (LOVENOX) injection  40 mg Subcutaneous Q24H   influenza vaccine adjuvanted  0.5 mL Intramuscular Tomorrow-1000   levothyroxine  112 mcg Oral Q0600   melatonin  3 mg Oral QHS   multivitamin with minerals  1 tablet Oral Daily   Ensure Max Protein  11 oz Oral BID   senna  1 tablet Oral BID   sertraline  50 mg Oral Daily   Continuous Infusions:  methocarbamol (ROBAXIN) IV       LOS: 12 days     Jacquelin Hawking, MD [facility] 05/10/2021, 2:41 PM  If 7PM-7AM, please contact night-coverage [email or web address]

## 2021-05-11 DIAGNOSIS — I480 Paroxysmal atrial fibrillation: Secondary | ICD-10-CM | POA: Diagnosis not present

## 2021-05-11 DIAGNOSIS — S82892A Other fracture of left lower leg, initial encounter for closed fracture: Secondary | ICD-10-CM | POA: Diagnosis not present

## 2021-05-11 DIAGNOSIS — E039 Hypothyroidism, unspecified: Secondary | ICD-10-CM | POA: Diagnosis not present

## 2021-05-11 NOTE — Progress Notes (Signed)
PROGRESS NOTE    [name]  BMW:[id number] DOB: 17-Aug-1945 DOA: 04/28/2021 PCP: Daisy Floro, MD   Brief Narrative: Sally Wise is a 75 y.o. female with a history of atrial fibrillation, hypothyroidism, depression, diastolic heart failure, mucoid diarrhea. Patient presented after suffering a fall and found to have a left ankle fracture. Orthopedic surgery consulted.   Assessment & Plan:   Active Problems:   Hypothyroidism   Morbid obesity (HCC)   PAF (paroxysmal atrial fibrillation) (HCC)   Thrombocytopenia (HCC)   Mucoid diarrhea   Ankle fracture   Chronic diastolic CHF (congestive heart failure) (HCC)   Anemia   Prolonged QT interval   Left ankle fracture Secondary to fall. Splint placed. Orthopedic surgery consulted on admission. ORIF performed on 10/25. -Orthopedic surgery recommendations: nonweightbearing of LLE, Lovenox > Xarelto, 2 week follow-up  AKI Baseline creatinine of about 1. Creatinine of 1.36 on admission, up to 1.56 at peak. Resolved with IV fluids.  Paroxysmal atrial fibrillation Not on anticoagulation. CHA2DS2-VASc Score is 4. On diltiazem as an outpatient. -Continue diltiazem  Mucoid diarrhea Chronic issue although patient clarifies that she isn't actually having diarrhea. GI consulted and signed off 10/18.  Hypothyroidism Patient is on Euthyrox as an outpatient -Continue Synthroid  Chronic diastolic heart failure Appears euvolemic. Not on lasix as an outpatient.  Thrombocytopenia Chronic. Patient follows with hematology.  Acute on chronic anemia Patient follows with hematology. Acute anemia of unknown etiology but may be related to fracture/surgery. Patient received a 1 unit PRBC transfusion on 10/19 for hemoglobin 7.5. Patient with recurrent drop in  hemoglobin down to 6.7 on 10/27 requiring transfusion of 1 unit of PRBC with significant improvement to 8.8 > 9.2. Unsure if 6.7 was a true hemoglobin. Still no evidence of  hemorrhage. Reticulocyte count normal  Elevated INR Unsure of etiology. AST/ALT normal. ALP elevated. No obvious bleeding.  Elevated alkaline phosphatase Unsure of etiology. GGT normal. Possible bone etiology but no obvious source. Stabilized.  Prolonged QTc Noted.  Morbid obesity Body mass index is 45.16 kg/m.   DVT prophylaxis: SCDs, Lovenox Code Status:   Code Status: Full Code Family Communication: Husband at bedside Disposition Plan: Discharge to SNF likely in 2 days pending bed availability. Medically stable for discharge.   Consultants:  Orthopedic surgery Gastroenterology  Procedures:  ORTHOPEDIC SURGERY (05/07/2021) OPEN TREATMENT LEFT ANKLE TRIMALLEOLAR FRACTURE WITH INTERNAL FIXATION INCLUDING FIXATION OF THE POSTERIOR LIP AP, LATERAL AND MORTISE RADIOGRAPHS OF THE LEFT ANKLE  Antimicrobials: None    Subjective: No issues overnight.  Objective: Vitals:   05/10/21 1300 05/10/21 2027 05/11/21 0531 05/11/21 1302  BP: 140/70 (!) 138/56 (!) 132/58 (!) 128/56  Pulse: 72 68 64 68  Resp: 20 16 20 18   Temp: 98.5 F (36.9 C) 98.2 F (36.8 C) 98.6 F (37 C) 98.2 F (36.8 C)  TempSrc: Oral Oral Oral Oral  SpO2: 96% 96% 96% 98%  Weight:      Height:        Intake/Output Summary (Last 24 hours) at 05/11/2021 1533 Last data filed at 05/11/2021 0830 Gross per 24 hour  Intake 240 ml  Output --  Net 240 ml    Filed Weights   04/28/21 1518 05/07/21 1433  Weight: 134.7 kg 134.7 kg    Examination:  General exam: Appears calm and comfortable  Respiratory system: Clear to auscultation. Respiratory effort normal. Cardiovascular system: S1 & S2 heard, RRR. Gastrointestinal system: Abdomen is nondistended, soft and nontender. No organomegaly or masses felt.  Normal bowel sounds heard. Central nervous system: Alert and oriented. No focal neurological deficits. Psychiatry: Judgement and insight appear normal. Mood & affect appropriate.     Data Reviewed: I  have personally reviewed following labs and imaging studies  CBC Lab Results  Component Value Date   WBC 4.1 05/10/2021   RBC 3.14 (L) 05/10/2021   HGB 9.2 (L) 05/10/2021   HCT 30.0 (L) 05/10/2021   MCV 95.5 05/10/2021   MCH 29.3 05/10/2021   PLT 95 (L) 05/10/2021   MCHC 30.7 05/10/2021   RDW 19.3 (H) 05/10/2021   LYMPHSABS 1.2 04/28/2021   MONOABS 1.0 04/28/2021   EOSABS 0.3 04/28/2021   BASOSABS 0.0 04/28/2021     Last metabolic panel Lab Results  Component Value Date   NA 137 05/09/2021   K 4.4 05/09/2021   CL 106 05/09/2021   CO2 25 05/09/2021   BUN 30 (H) 05/09/2021   CREATININE 1.08 (H) 05/09/2021   GLUCOSE 88 05/09/2021   GFRNONAA 53 (L) 05/09/2021   GFRAA 80 (L) 08/05/2013   CALCIUM 8.2 (L) 05/09/2021   PHOS 3.9 04/28/2021   PROT 5.8 (L) 04/30/2021   ALBUMIN 2.5 (L) 04/30/2021   BILITOT 0.4 04/30/2021   ALKPHOS 240 (H) 04/30/2021   AST 18 04/30/2021   ALT 12 04/30/2021   ANIONGAP 6 05/09/2021    CBG (last 3)  No results for input(s): GLUCAP in the last 72 hours.   GFR: Estimated Creatinine Clearance: 63.5 mL/min (A) (by C-G formula based on SCr of 1.08 mg/dL (H)).  Coagulation Profile: No results for input(s): INR, PROTIME in the last 168 hours.  Recent Results (from the past 240 hour(s))  Surgical pcr screen     Status: None   Collection Time: 05/07/21  1:09 AM   Specimen: Nasal Mucosa; Nasal Swab  Result Value Ref Range Status   MRSA, PCR NEGATIVE NEGATIVE Final   Staphylococcus aureus NEGATIVE NEGATIVE Final    Comment: (NOTE) The Xpert SA Assay (FDA approved for NASAL specimens in patients 80 years of age and older), is one component of a comprehensive surveillance program. It is not intended to diagnose infection nor to guide or monitor treatment. Performed at [facility], [address], [place], [place] [address]          Radiology Studies: No results found.      Scheduled Meds:  sodium chloride    Intravenous Once   busPIRone  15 mg Oral BID   diltiazem  180 mg Oral Daily   docusate sodium  100 mg Oral BID   enoxaparin (LOVENOX) injection  40 mg Subcutaneous Q24H   influenza vaccine adjuvanted  0.5 mL Intramuscular Tomorrow-1000   levothyroxine  112 mcg Oral Q0600   melatonin  3 mg Oral QHS   multivitamin with minerals  1 tablet Oral Daily   Ensure Max Protein  11 oz Oral BID   senna  1 tablet Oral BID   sertraline  50 mg Oral Daily   Continuous Infusions:  methocarbamol (ROBAXIN) IV       LOS: 13 days     Jacquelin Hawking, MD [facility] 05/11/2021, 3:33 PM  If 7PM-7AM, please contact night-coverage [email or web address]

## 2021-05-11 NOTE — Progress Notes (Signed)
Patient sat at the edge of the bed for approximately 5 minutes. Patient able to stand at bedside for 15 seconds before sitting back down. Patient is fearful of putting weight on her left leg. Helped patient back into the bed. Explained that she will just have to continue to try getting up and eventually it will get easier. Patient states that she is depressed because she feels she will not be able to walk again.

## 2021-05-12 DIAGNOSIS — S82892A Other fracture of left lower leg, initial encounter for closed fracture: Secondary | ICD-10-CM | POA: Diagnosis not present

## 2021-05-12 DIAGNOSIS — E039 Hypothyroidism, unspecified: Secondary | ICD-10-CM | POA: Diagnosis not present

## 2021-05-12 DIAGNOSIS — I480 Paroxysmal atrial fibrillation: Secondary | ICD-10-CM | POA: Diagnosis not present

## 2021-05-12 NOTE — TOC Progression Note (Signed)
Transition of Care [facility]) - Progression Note    Patient Details  Name: Sally Wise MRN: [id number] Date of Birth: 1946/01/16  Transition of Care [facility]) CM/SW Contact  Darleene Cleaver, [place] Phone Number: 05/12/2021, 5:23 PM  Clinical Narrative:     Patient will need insurance authorization and a new covid test.  CSW requested new Covid test, and also asked PT to see patient on Monday for updated PT notes.  CSW to continue to follow patient's progress throughout discharge planning.  Expected Discharge Plan: Skilled Nursing Facility Barriers to Discharge: SNF Pending bed offer  Expected Discharge Plan and Services Expected Discharge Plan: Skilled Nursing Facility   Discharge Planning Services: CM Consult Post Acute Care Choice: Skilled Nursing Facility Living arrangements for the past 2 months: Single Family Home                                       Social Determinants of Health (SDOH) Interventions    Readmission Risk Interventions Readmission Risk Prevention Plan 06/08/2020  Post Dischage Appt Complete  Medication Screening Complete  Transportation Screening Complete  Some recent data might be hidden

## 2021-05-12 NOTE — Progress Notes (Signed)
PROGRESS NOTE    Sally Wise  UEK:[id number] DOB: [birth year] DOA: 04/28/2021 PCP: Daisy Floro, MD   Brief Narrative: Sally Wise is a 75 y.o. female with a history of atrial fibrillation, hypothyroidism, depression, diastolic heart failure, mucoid diarrhea. Patient presented after suffering a fall and found to have a left ankle fracture. Orthopedic surgery consulted.   Assessment & Plan:   Active Problems:   Hypothyroidism   Morbid obesity (HCC)   PAF (paroxysmal atrial fibrillation) (HCC)   Thrombocytopenia (HCC)   Mucoid diarrhea   Ankle fracture   Chronic diastolic CHF (congestive heart failure) (HCC)   Anemia   Prolonged QT interval   Left ankle fracture Secondary to fall. Splint placed. Orthopedic surgery consulted on admission. ORIF performed on 10/25. -Orthopedic surgery recommendations: nonweightbearing of LLE, Lovenox > Xarelto, 2 week follow-up  AKI Baseline creatinine of about 1. Creatinine of 1.36 on admission, up to 1.56 at peak. Resolved with IV fluids.  Paroxysmal atrial fibrillation Not on anticoagulation. CHA2DS2-VASc Score is 4. On diltiazem as an outpatient. -Continue diltiazem  Mucoid diarrhea Chronic issue although patient clarifies that she isn't actually having diarrhea. GI consulted and signed off 10/18.  Hypothyroidism Patient is on Euthyrox as an outpatient -Continue Synthroid  Chronic diastolic heart failure Appears euvolemic. Not on lasix as an outpatient.  Thrombocytopenia Chronic. Patient follows with hematology.  Acute on chronic anemia Patient follows with hematology. Acute anemia of unknown etiology but may be related to fracture/surgery. Patient received a 1 unit PRBC transfusion on 10/19 for hemoglobin 7.5. Patient with recurrent drop in  hemoglobin down to 6.7 on 10/27 requiring transfusion of 1 unit of PRBC with significant improvement to 8.8 > 9.2. Unsure if 6.7 was a true hemoglobin. Still no evidence of  hemorrhage. Reticulocyte count normal  Elevated INR Unsure of etiology. AST/ALT normal. ALP elevated. No obvious bleeding.  Elevated alkaline phosphatase Unsure of etiology. GGT normal. Possible bone etiology but no obvious source. Stabilized.  Prolonged QTc Noted.  Morbid obesity Body mass index is 45.16 kg/m.   DVT prophylaxis: SCDs, Lovenox Code Status:   Code Status: Full Code Family Communication: Husband at bedside Disposition Plan: Discharge to SNF likely in 1 day pending bed availability. Medically stable for discharge.   Consultants:  Orthopedic surgery Gastroenterology  Procedures:  ORTHOPEDIC SURGERY (05/07/2021) OPEN TREATMENT LEFT ANKLE TRIMALLEOLAR FRACTURE WITH INTERNAL FIXATION INCLUDING FIXATION OF THE POSTERIOR LIP AP, LATERAL AND MORTISE RADIOGRAPHS OF THE LEFT ANKLE  Antimicrobials: None    Subjective: Got up on her foot for a few seconds yesterday.  Objective: Vitals:   05/11/21 1302 05/11/21 2001 05/12/21 0453 05/12/21 1353  BP: (!) 128/56 (!) 145/92 (!) 125/53 (!) 132/53  Pulse: 68 73 72 66  Resp: 18 17 17 19   Temp: 98.2 F (36.8 C) 99 F (37.2 C) 98.1 F (36.7 C) (!) 97.1 F (36.2 C)  TempSrc: Oral Oral Oral   SpO2: 98% 97% 95% 96%  Weight:      Height:        Intake/Output Summary (Last 24 hours) at 05/12/2021 1440 Last data filed at 05/11/2021 1842 Gross per 24 hour  Intake 480 ml  Output 1000 ml  Net -520 ml    Filed Weights   04/28/21 1518 05/07/21 1433  Weight: 134.7 kg 134.7 kg    Examination:  General exam: Appears calm and comfortable Respiratory system: Clear to auscultation. Respiratory effort normal. Cardiovascular system: S1 & S2 heard, RRR. No murmurs, rubs, gallops  or clicks. Gastrointestinal system: Abdomen is nondistended, soft and nontender. No organomegaly or masses felt. Normal bowel sounds heard. Central nervous system: Alert and oriented. No focal neurological deficits. Musculoskeletal: No calf  tenderness. Left leg splint Skin: No cyanosis. No rashes Psychiatry: Judgement and insight appear normal. Depressed mood.    Data Reviewed: I have personally reviewed following labs and imaging studies  CBC Lab Results  Component Value Date   WBC 4.1 05/10/2021   RBC 3.14 (L) 05/10/2021   HGB 9.2 (L) 05/10/2021   HCT 30.0 (L) 05/10/2021   MCV 95.5 05/10/2021   MCH 29.3 05/10/2021   PLT 95 (L) 05/10/2021   MCHC 30.7 05/10/2021   RDW 19.3 (H) 05/10/2021   LYMPHSABS 1.2 04/28/2021   MONOABS 1.0 04/28/2021   EOSABS 0.3 04/28/2021   BASOSABS 0.0 04/28/2021     Last metabolic panel Lab Results  Component Value Date   NA 137 05/09/2021   K 4.4 05/09/2021   CL 106 05/09/2021   CO2 25 05/09/2021   BUN 30 (H) 05/09/2021   CREATININE 1.08 (H) 05/09/2021   GLUCOSE 88 05/09/2021   GFRNONAA 53 (L) 05/09/2021   GFRAA 80 (L) 08/05/2013   CALCIUM 8.2 (L) 05/09/2021   PHOS 3.9 04/28/2021   PROT 5.8 (L) 04/30/2021   ALBUMIN 2.5 (L) 04/30/2021   BILITOT 0.4 04/30/2021   ALKPHOS 240 (H) 04/30/2021   AST 18 04/30/2021   ALT 12 04/30/2021   ANIONGAP 6 05/09/2021    CBG (last 3)  No results for input(s): GLUCAP in the last 72 hours.   GFR: Estimated Creatinine Clearance: 63.5 mL/min (A) (by C-G formula based on SCr of 1.08 mg/dL (H)).  Coagulation Profile: No results for input(s): INR, PROTIME in the last 168 hours.  Recent Results (from the past 240 hour(s))  Surgical pcr screen     Status: None   Collection Time: 05/07/21  1:09 AM   Specimen: Nasal Mucosa; Nasal Swab  Result Value Ref Range Status   MRSA, PCR NEGATIVE NEGATIVE Final   Staphylococcus aureus NEGATIVE NEGATIVE Final    Comment: (NOTE) The Xpert SA Assay (FDA approved for NASAL specimens in patients 37 years of age and older), is one component of a comprehensive surveillance program. It is not intended to diagnose infection nor to guide or monitor treatment. Performed at [facility],  [address] [place], [place] [address]          Radiology Studies: No results found.      Scheduled Meds:  sodium chloride   Intravenous Once   busPIRone  15 mg Oral BID   diltiazem  180 mg Oral Daily   docusate sodium  100 mg Oral BID   enoxaparin (LOVENOX) injection  40 mg Subcutaneous Q24H   influenza vaccine adjuvanted  0.5 mL Intramuscular Tomorrow-1000   levothyroxine  112 mcg Oral Q0600   melatonin  3 mg Oral QHS   multivitamin with minerals  1 tablet Oral Daily   Ensure Max Protein  11 oz Oral BID   senna  1 tablet Oral BID   sertraline  50 mg Oral Daily   Continuous Infusions:  methocarbamol (ROBAXIN) IV       LOS: 14 days     Jacquelin Hawking, MD [facility] 05/12/2021, 2:40 PM  If 7PM-7AM, please contact night-coverage [email or web address]

## 2021-05-12 NOTE — Plan of Care (Signed)

## 2021-05-13 DIAGNOSIS — S82892A Other fracture of left lower leg, initial encounter for closed fracture: Secondary | ICD-10-CM | POA: Diagnosis not present

## 2021-05-13 LAB — PATHOLOGIST SMEAR REVIEW

## 2021-05-13 LAB — RESP PANEL BY RT-PCR (FLU A&B, COVID) ARPGX2
Influenza A by PCR: NEGATIVE
Influenza B by PCR: NEGATIVE
SARS Coronavirus 2 by RT PCR: NEGATIVE

## 2021-05-13 NOTE — Discharge Summary (Signed)
Physician Discharge Summary  QUINLYN TEP DXI:[id number] DOB: 1946/02/18 DOA: 04/28/2021  PCP: [name], MD  Admit date: 04/28/2021 Discharge date: 05/13/2021  Admitted From: Home Disposition: SNF  Recommendations for Outpatient Follow-up:  Follow up with PCP in 1 week Follow up with orthopedic surgery in 2 weeks Non-weight bearing left lower extremity Please follow up on the following pending results: None  Discharge Condition: Stable CODE STATUS: Full code Diet recommendation: Regular diet   Brief/Interim Summary:  Admission HPI written by Toy Baker, MD   HPI: Sally Wise is a 75 y.o. female with medical history significant of a.fib, synthroid, depression, HLD, HTN, obesity, chronic diastolic CHF   Presented with  deformity to left ankle. Walks with a walker.  She was going to the bathroom and was feeling too weak and still went down No LOC, no headache or confusion Received 100 mcg of Fentanyl Reports early satiety mucus like stools   She has some bloating eating makes her want to go to the bathroom she feels like her esophagus is closing off sometimes   In September pt did test positive for EPEC was reportedly given Zpack for it Her symptoms never resolved   Hospital course:  Left ankle fracture Secondary to fall. Splint placed. Orthopedic surgery consulted on admission. ORIF performed on 10/25. Orthopedic surgery recommendations include nonweightbearing of LLE, Xarelto for DVT prophylaxis and two week follow-up.   AKI Baseline creatinine of about 1. Creatinine of 1.36 on admission, up to 1.56 at peak. Resolved with IV fluids.   Paroxysmal atrial fibrillation Not on anticoagulation. CHA2DS2-VASc Score is 4. On diltiazem as an outpatient. Continue diltiazem.   Mucoid diarrhea Chronic issue although patient clarifies that she isn't actually having diarrhea. GI consulted and signed off 10/18.   Hypothyroidism Patient is on  Euthyrox as an outpatient. Continue on discharge.   Chronic diastolic heart failure Appears euvolemic. Not on lasix as an outpatient.   Thrombocytopenia Chronic. Patient follows with hematology.   Acute on chronic anemia Patient follows with hematology. Acute anemia of unknown etiology but may be related to fracture/surgery. Patient received a 1 unit PRBC transfusion on 10/19 for hemoglobin 7.5. Patient with recurrent drop in  hemoglobin down to 6.7 on 10/27 requiring transfusion of 1 unit of PRBC with significant improvement to 8.8 > 9.2. Unsure if 6.7 was a true hemoglobin. Still no evidence of hemorrhage. Reticulocyte count normal   Elevated INR Unsure of etiology. AST/ALT normal. ALP elevated. No obvious bleeding.   Elevated alkaline phosphatase Unsure of etiology. GGT normal. Possible bone etiology but no obvious source. Stabilized.   Prolonged QTc Noted.   Morbid obesity Body mass index is 45.16 kg/m.  Discharge Diagnoses:  Active Problems:   Hypothyroidism   Morbid obesity (HCC)   PAF (paroxysmal atrial fibrillation) (HCC)   Thrombocytopenia (HCC)   Mucoid diarrhea   Ankle fracture   Chronic diastolic CHF (congestive heart failure) (HCC)   Anemia   Prolonged QT interval    Discharge Instructions  Discharge Instructions     Diet - low sodium heart healthy   Complete by: As directed    No wound care   Complete by: As directed       Allergies as of 05/13/2021   No Known Allergies      Medication List     STOP taking these medications    flecainide 150 MG tablet Commonly known as: TAMBOCOR       TAKE these medications  busPIRone 15 MG tablet Commonly known as: BUSPAR Take 15 mg by mouth in the morning and at bedtime.   diltiazem 180 MG 24 hr capsule Commonly known as: CARDIZEM CD TAKE 1 CAPSULE BY MOUTH EVERY DAY What changed:  how much to take when to take this   docusate sodium 100 MG capsule Commonly known as: Colace Take 1  capsule (100 mg total) by mouth 2 (two) times daily. While taking narcotic pain medicine.   Euthyrox 112 MCG tablet Generic drug: levothyroxine Take 112 mcg by mouth daily before breakfast.   folic acid 1 MG tablet Commonly known as: FOLVITE Take 1 tablet (1 mg total) by mouth daily.   loperamide 2 MG capsule Commonly known as: IMODIUM Take 1 capsule (2 mg total) by mouth every 4 (four) hours as needed for diarrhea or loose stools.   oxyCODONE 5 MG immediate release tablet Commonly known as: Roxicodone Take 1 tablet (5 mg total) by mouth every 4 (four) hours as needed for up to 5 days for severe pain.   rivaroxaban 10 MG Tabs tablet Commonly known as: Xarelto Take 1 tablet (10 mg total) by mouth daily.   senna 8.6 MG Tabs tablet Commonly known as: SENOKOT Take 2 tablets (17.2 mg total) by mouth 2 (two) times daily.   sertraline 50 MG tablet Commonly known as: ZOLOFT Take 50 mg by mouth in the morning.   Vitamin D3 125 MCG (5000 UT) Tabs Take 1 tablet by mouth daily.        Contact information for follow-up providers     Wylene Simmer, MD. Schedule an appointment as soon as possible for a visit in 2 week(s).   Specialty: Orthopedic Surgery Contact information: [address] [place] [address] [phone]              Contact information for after-discharge care     [facility] .   Service: Skilled Nursing Contact information: [address] [place] [phone]                    No Known Allergies  Consultations: Orthopedic surgery Gastroenterology   Procedures/Studies: DG Ankle Complete Left  Result Date: 04/28/2021 CLINICAL DATA:  Fall, injury, deformity EXAM: LEFT ANKLE COMPLETE - 3+ VIEW COMPARISON:  None. FINDINGS: There is fracture dislocation at the left ankle. Fracture through the medial malleolus and distal fibula. The talus is dislocated laterally  relative to the tibia. Probable posterior malleolar fracture as well. IMPRESSION: Trimalleolar fracture dislocation. Electronically Signed   By: Rolm Baptise M.D.   On: 04/28/2021 16:05   US Abdomen Complete  Result Date: 04/28/2021 CLINICAL DATA:  Elevated alk-phos, AK I. EXAM: ABDOMEN ULTRASOUND COMPLETE COMPARISON:  CT March 15, 2021 FINDINGS: Gallbladder: Cholelithiasis measuring up to 2.1 cm. Gallbladder wall cholesterolosis. No gallbladder wall thickening visualized. No sonographic Murphy sign noted by sonographer. Common bile duct: Diameter: 5 mm Liver: No focal lesion identified. Diffusely increased parenchymal echogenicity. Portal vein is patent on color Doppler imaging with normal direction of blood flow towards the liver. IVC: No abnormality visualized. Pancreas: Visualized portion unremarkable. Spleen: Size and appearance within normal limits. Right Kidney: Length: 10.1 cm. Cortical thinning. No mass or hydronephrosis visualized. Left Kidney: Length: 9.4 cm. Cortical thinning. No mass or hydronephrosis visualized. Abdominal aorta: No aneurysm visualized. Other findings: None. IMPRESSION: 1. Cholelithiasis without sonographic evidence of acute cholecystitis. 2. The echogenicity of the liver is increased.  This is a nonspecific finding but is most commonly seen with fatty infiltration of the liver. There are no obvious focal liver lesions. 3. Bilateral renal cortical thinning as can be seen with medical renal disease. 4. Small volume perihepatic and right perinephric free fluid. Electronically Signed   By: Dahlia Bailiff M.D.   On: 04/28/2021 23:25   US RENAL  Result Date: 05/01/2021 CLINICAL DATA:  Acute renal injury. EXAM: RENAL / URINARY TRACT ULTRASOUND COMPLETE COMPARISON:  CT abdomen and pelvis 03/15/2021. ultrasound abdomen 04/28/2021. FINDINGS: Right Kidney: Renal measurements: 9.9 x 5.3 x 5.2 cm = volume: 144 mL. Echogenicity within normal limits. No mass or hydronephrosis visualized.  Left Kidney: Renal measurements: 9.4 x 5.9 x 5.5 cm = volume: 160 mL. Echogenicity within normal limits. No mass or hydronephrosis visualized. Bladder: Appears normal for degree of bladder distention. Bilateral ureteral jets are visualized. Other: Suboptimal secondary to patient breathing and body habitus. IMPRESSION: 1. No hydronephrosis. Electronically Signed   By: Ronney Asters M.D.   On: 05/01/2021 19:07   CT ANKLE LEFT WO CONTRAST  Result Date: 04/29/2021 CLINICAL DATA:  Fracture, ankle rule out posterior malleolus fracture; for preop planning EXAM: CT OF THE LEFT ANKLE WITHOUT CONTRAST TECHNIQUE: Multidetector CT imaging of the left ankle was performed according to the standard protocol. Multiplanar CT image reconstructions were also generated. COMPARISON:  Same day radiograph FINDINGS: Bones/Joint/Cartilage There is a trimalleolar fracture with lateral subluxation of the talus with respect to the tibia. Medial malleolar fracture is displaced up to 9 mm. The oblique lateral malleolar fracture is posterolaterally displaced up to 6 mm. There is a posterior malleolar fracture which involves approximately 25% of the articular surface. The posterior malleolar fragment is displaced up to 3 mm. Diffuse osteopenia. Small bony fragments along the dorsal aspect of the talar head, suggesting dorsal capsular avulsion injury. Plantar calcaneal spurring. Partially visualized osteoarthritis in the tarsometatarsal joints. Mild talonavicular osteoarthritis. Ligaments Suboptimally assessed by CT. Muscles and Tendons No evidence of tendon entrapment.  Mild muscle atrophy. Soft tissues Extensive ankle soft tissue swelling.  No focal fluid collection. IMPRESSION: Trimalleolar ankle fracture with lateral subluxation of the talus with respect to the tibia and mild fracture displacement as described above. The posterior malleolar fracture involves approximately 25% of the articular surface. Tiny bony fragments along the dorsal  aspect of the talar head consistent with dorsal capsular avulsion fracture. Diffuse osteopenia. Electronically Signed   By: Maurine Simmering M.D.   On: 04/29/2021 10:55   DG Chest Port 1 View  Result Date: 04/28/2021 CLINICAL DATA:  Fall EXAM: PORTABLE CHEST 1 VIEW COMPARISON:  06/05/2020 FINDINGS: Cardiomegaly. Lungs clear. No effusions or edema. No acute bony abnormality. IMPRESSION: Cardiomegaly.  No active disease. Electronically Signed   By: Rolm Baptise M.D.   On: 04/28/2021 16:05   DG Ankle Left Port  Result Date: 04/28/2021 CLINICAL DATA:  Postreduction EXAM: PORTABLE LEFT ANKLE - 2 VIEW COMPARISON:  04/28/2021 FINDINGS: Interval reduction of the previously seen dislocated left ankle. Improved alignment of the medial malleolar and lateral malleolar fractures with continued mild displacement. Small fracture fragment at the posterior corner of the tibia on the lateral view. IMPRESSION: Interval reduction of the dislocation. Improved alignment of the trimalleolar fracture. Electronically Signed   By: Rolm Baptise M.D.   On: 04/28/2021 19:16     Subjective: No concerns this morning except worried about her progression  Discharge Exam: Vitals:   05/12/21 2017 05/13/21 0610  BP: (!) 133/57 128/65  Pulse: 68 62  Resp: (!) 24 16  Temp: 98.5 F (36.9 C) 98.8 F (37.1 C)  SpO2: 95% 95%   Vitals:   05/12/21 0453 05/12/21 1353 05/12/21 2017 05/13/21 0610  BP: (!) 125/53 (!) 132/53 (!) 133/57 128/65  Pulse: 72 66 68 62  Resp: 17 19 (!) 24 16  Temp: 98.1 F (36.7 C) (!) 97.1 F (36.2 C) 98.5 F (36.9 C) 98.8 F (37.1 C)  TempSrc: Oral  Oral Oral  SpO2: 95% 96% 95% 95%  Weight:      Height:        General exam: Appears calm and comfortable Respiratory system: Clear to auscultation. Respiratory effort normal. Cardiovascular system: S1 & S2 heard, RRR. No murmurs, rubs, gallops or clicks. Gastrointestinal system: Abdomen is nondistended, soft and nontender. No organomegaly or masses  felt. Normal bowel sounds heard. Central nervous system: Alert and oriented. No focal neurological deficits. Musculoskeletal: No calf tenderness. Left leg splint Skin: No cyanosis. No rashes Psychiatry: Judgement and insight appear normal. Depressed mood.   The results of significant diagnostics from this hospitalization (including imaging, microbiology, ancillary and laboratory) are listed below for reference.     Microbiology: Recent Results (from the past 240 hour(s))  Surgical pcr screen     Status: None   Collection Time: 05/07/21  1:09 AM   Specimen: Nasal Mucosa; Nasal Swab  Result Value Ref Range Status   MRSA, PCR NEGATIVE NEGATIVE Final   Staphylococcus aureus NEGATIVE NEGATIVE Final    Comment: (NOTE) The Xpert SA Assay (FDA approved for NASAL specimens in patients 72 years of age and older), is one component of a comprehensive surveillance program. It is not intended to diagnose infection nor to guide or monitor treatment. Performed at [facility], [place] [address] [place], [place] [address]      Labs: BNP (last 3 results) Recent Labs    06/03/20 1251  BNP 951.8*   Basic Metabolic Panel: Recent Labs  Lab 05/07/21 0540 05/08/21 0512 05/09/21 0507  NA 135 133* 137  K 5.0 5.0 4.4  CL 102 102 106  CO2 _0 GLUCOSE 89 114* 88  BUN 30* 27* 30*  CREATININE 1.02* 0.95 1.08*  CALCIUM 8.2* 8.2* 8.2*   Liver Function Tests: No results for input(s): AST, ALT, ALKPHOS, BILITOT, PROT, ALBUMIN in the last 168 hours. No results for input(s): LIPASE, AMYLASE in the last 168 hours. No results for input(s): AMMONIA in the last 168 hours. CBC: Recent Labs  Lab 05/07/21 0540 05/08/21 0512 05/09/21 0507 05/09/21 1852 05/10/21 1027  WBC 5.5 3.8* 5.7  --  4.1  HGB 8.4* 7.8* 6.7* 8.8* 9.2*  HCT 27.2* 25.8* 21.9* 28.4* 30.0*  MCV 94.4 96.6 96.1  --  95.5  PLT 100* 98* 92*  --  95*   Cardiac Enzymes: No results for input(s): CKTOTAL, CKMB,  CKMBINDEX, TROPONINI in the last 168 hours. BNP: Invalid input(s): POCBNP CBG: No results for input(s): GLUCAP in the last 168 hours. D-Dimer No results for input(s): DDIMER in the last 72 hours. Hgb A1c No results for input(s): HGBA1C in the last 72 hours. Lipid Profile No results for input(s): CHOL, HDL, LDLCALC, TRIG, CHOLHDL, LDLDIRECT in the last 72 hours. Thyroid function studies No results for input(s): TSH, T4TOTAL, T3FREE, THYROIDAB in the last 72 hours.  Invalid input(s): FREET3 Anemia work up No results for input(s): VITAMINB12, FOLATE, FERRITIN, TIBC, IRON, RETICCTPCT in the last 72 hours. Urinalysis    Component  Value Date/Time   COLORURINE GREEN (A) 06/05/2020 1800   APPEARANCEUR CLEAR 06/05/2020 1800   LABSPEC 1.015 06/05/2020 1800   PHURINE 5.5 06/05/2020 1800   GLUCOSEU NEGATIVE 06/05/2020 1800   HGBUR TRACE (A) 06/05/2020 1800   BILIRUBINUR NEGATIVE 06/05/2020 1800   BILIRUBINUR neg 04/08/2016 0916   KETONESUR NEGATIVE 06/05/2020 1800   PROTEINUR NEGATIVE 06/05/2020 1800   UROBILINOGEN negative 04/08/2016 0916   UROBILINOGEN 0.2 01/15/2013 1538   NITRITE NEGATIVE 06/05/2020 1800   LEUKOCYTESUR NEGATIVE 06/05/2020 1800   Sepsis Labs Invalid input(s): PROCALCITONIN,  WBC,  LACTICIDVEN Microbiology Recent Results (from the past 240 hour(s))  Surgical pcr screen     Status: None   Collection Time: 05/07/21  1:09 AM   Specimen: Nasal Mucosa; Nasal Swab  Result Value Ref Range Status   MRSA, PCR NEGATIVE NEGATIVE Final   Staphylococcus aureus NEGATIVE NEGATIVE Final    Comment: (NOTE) The Xpert SA Assay (FDA approved for NASAL specimens in patients 52 years of age and older), is one component of a comprehensive surveillance program. It is not intended to diagnose infection nor to guide or monitor treatment. Performed at [facility], [place] [address] [place], [place] [address]      Time coordinating discharge: 35  minutes  SIGNED:   Cordelia Poche, MD [facility] 05/13/2021, 11:35 AM

## 2021-05-13 NOTE — Progress Notes (Signed)
Physical Therapy Treatment Patient Details Name: Sally Wise MRN: [id number] DOB: 25-Oct-1945 Today's Date: 05/13/2021   History of Present Illness The patient is a 75 year old female with a past medical history significant for morbid obesity who fell late last week injuring her left ankle.  She sustained a trimalleolar fracture dislocation.  Pt s/p left ORIF 05/07/21. PMH: PAF, CHF, B TKA, hypothyroidism    PT Comments    The patient eager to participate in  mobility. Patient is not strong enough to safely attempt standing without WB risk on LLE. Patient performed scooting to left and right in preparation for  goals of scooting to a recliner/WC. Patient requires use of bed  pad  and mod/max to assist scooting.  Continue PT for mobility/scooting  transfer attempts.  Patient has propensity to place Left foot on floor for ,leverage. Multiple cues that she is NWB so did not attempt standing.  Recommendations for follow up therapy are one component of a multi-disciplinary discharge planning process, led by the attending physician.  Recommendations may be updated based on patient status, additional functional criteria and insurance authorization.  Follow Up Recommendations  Skilled nursing-short term rehab (<3 hours/day)     Assistance Recommended at Discharge  supervision  Equipment Recommendations  None recommended by PT    Recommendations for Other Services       Precautions / Restrictions Precautions Precautions: Fall Restrictions LLE Weight Bearing: Non weight bearing     Mobility  Bed Mobility   Bed Mobility: Supine to Sit;Rolling Rolling: Mod assist;Max assist         General bed mobility comments: mod to the left and max to right, assist with L leg , mod assist for sitting upright, assist with Left leg,. Assist with trunk and to scoot left hip to edge. max assist with both legs back onto bed to return to supine.  Patient scooted along bed edge a few inches to the   right with mod assist and to left with max and use of bed pad.    Transfers                   General transfer comment: Did not attempt due to NWB and failure at last attempt and patient wanting something to prevent Left foot from sliding, knowing that there most likely be wieght on  left foot.    Ambulation/Gait                 Stairs             Wheelchair Mobility    Modified Rankin (Stroke Patients Only)       Balance Overall balance assessment: Needs assistance Sitting-balance support: Feet supported Sitting balance-Leahy Scale: Good Sitting balance - Comments: patient able to sit unassisted       Standing balance comment: Not able                            Cognition Arousal/Alertness: Awake/alert Behavior During Therapy: Flat affect                                   General Comments: pt appears frustrated with lack of mobility and also reports fear of falling        Exercises      General Comments        Pertinent Vitals/Pain Faces Pain Scale: Hurts  little more Pain Location: periarea. Pain Descriptors / Indicators: Burning Pain Intervention(s): Repositioned    Home Living                          Prior Function            PT Goals (current goals can now be found in the care plan section) Progress towards PT goals: Progressing toward goals    Frequency    Min 2X/week      PT Plan Current plan remains appropriate    Co-evaluation PT/OT/SLP Co-Evaluation/Treatment: Yes Reason for Co-Treatment: For patient/therapist safety PT goals addressed during session: Mobility/safety with mobility        AM-PAC PT "6 Clicks" Mobility   Outcome Measure  Help needed turning from your back to your side while in a flat bed without using bedrails?: A Lot Help needed moving from lying on your back to sitting on the side of a flat bed without using bedrails?: A Lot Help needed moving to  and from a bed to a chair (including a wheelchair)?: Total Help needed standing up from a chair using your arms (e.g., wheelchair or bedside chair)?: Total Help needed to walk in hospital room?: Total Help needed climbing 3-5 steps with a railing? : Total 6 Click Score: 8    End of Session   Activity Tolerance: Patient tolerated treatment well Patient left: in bed;with call bell/phone within reach;with family/visitor present Nurse Communication: Mobility status PT Visit Diagnosis: Unsteadiness on feet (R26.81);History of falling (Z91.81);Other abnormalities of gait and mobility (R26.89)     Time: 4580-9983 PT Time Calculation (min) (ACUTE ONLY): 33 min  Charges:  $Therapeutic Activity: 8-22 mins                     Sally Wise PT Acute Rehabilitation Services Pager [phone] Office [phone]    Rada Hay 05/13/2021, 11:05 AM

## 2021-05-13 NOTE — Progress Notes (Signed)
Occupational Therapy Treatment Patient Details Name: Sally Wise MRN: [id number] DOB: 08-19-1945 Today's Date: 05/13/2021   History of present illness The patient is a 75 year old female with a past medical history significant for morbid obesity who fell late last week injuring her left ankle.  She sustained a trimalleolar fracture dislocation.  Pt s/p left ORIF 05/07/21. PMH: PAF, CHF, B TKA, hypothyroidism   OT comments  Patient was educated on strategies for participation in ADLs like toilting while maintaining NWB status. patient was educated on weight shifting to don/doff pants with patient verbalizeing understanding. Patient participated in scooting attempts sitting on edge of bed with max A to keep LLE off floor and max A to scoot to L side. Patient was noted to have minimal dizziness with increased movement on this date. Patient's discharge plan remains appropriate at this time. OT will continue to follow acutely.     Recommendations for follow up therapy are one component of a multi-disciplinary discharge planning process, led by the attending physician.  Recommendations may be updated based on patient status, additional functional criteria and insurance authorization.    Follow Up Recommendations  Skilled nursing-short term rehab (<3 hours/day)    Assistance Recommended at Discharge Frequent or constant Supervision/Assistance  Equipment Recommendations  BSC;Wheelchair (measurements OT)    Recommendations for Other Services      Precautions / Restrictions Precautions Precautions: Fall Restrictions Weight Bearing Restrictions: Yes RLE Weight Bearing: Weight bearing as tolerated LLE Weight Bearing: Non weight bearing       Mobility Bed Mobility Overal bed mobility: Needs Assistance Bed Mobility: Supine to Sit;Rolling Rolling: Mod assist;Max assist         General bed mobility comments: mod to the left and max to right, assist with L leg , mod assist for sitting  upright, assist with Left leg,. Assist with trunk and to scoot left hip to edge. max assist with both legs back onto bed to return to supine.  Patient scooted along bed edge a few inches to the  right with mod assist and to left with max and use of bed pad.    Transfers                   General transfer comment: Did not attempt due to NWB and failure at last attempt and patient wanting something to prevent Left foot from sliding, knowing that there most likely be wieght on  left foot.     Balance Overall balance assessment: Needs assistance Sitting-balance support: Feet supported Sitting balance-Leahy Scale: Good Sitting balance - Comments: patient able to sit unassisted       Standing balance comment: Not able                           ADL either performed or assessed with clinical judgement   ADL Overall ADL's : Needs assistance/impaired                                       General ADL Comments: patient was educated on strategies for participation in ADLs like toilting while maintainign NWB status. patient was educated on weight shifting to don/doff pants with patietn verbalizeing understanding. patietns session was cut short with patietn having reports of increased dizziness. patient returned to supine at this time. patient reported dizziness faded with sitting on edge of bed. patient was positioned  in chair position in bed prior to end of session to increase patients tolerance of sitting upright.     Vision Patient Visual Report: No change from baseline     Perception     Praxis      Cognition Arousal/Alertness: Awake/alert Behavior During Therapy: Flat affect Overall Cognitive Status: Within Functional Limits for tasks assessed                                 General Comments: pt appears frustrated with lack of mobility and also reports fear of falling          Exercises     Shoulder Instructions       General  Comments      Pertinent Vitals/ Pain       Pain Assessment: No/denies pain Faces Pain Scale: Hurts little more Pain Location: periarea. Pain Descriptors / Indicators: Burning Pain Intervention(s): Repositioned  Home Living                                          Prior Functioning/Environment              Frequency  Min 2X/week        Progress Toward Goals  OT Goals(current goals can now be found in the care plan section)  Progress towards OT goals: Progressing toward goals     Plan Discharge plan remains appropriate    Co-evaluation    PT/OT/SLP Co-Evaluation/Treatment: Yes Reason for Co-Treatment: For patient/therapist safety PT goals addressed during session: Mobility/safety with mobility OT goals addressed during session: ADL's and self-care      AM-PAC OT "6 Clicks" Daily Activity     Outcome Measure   Help from another person eating meals?: None Help from another person taking care of personal grooming?: A Little Help from another person toileting, which includes using toliet, bedpan, or urinal?: Total Help from another person bathing (including washing, rinsing, drying)?: Total Help from another person to put on and taking off regular upper body clothing?: A Lot Help from another person to put on and taking off regular lower body clothing?: Total 6 Click Score: 12    End of Session    OT Visit Diagnosis: Unsteadiness on feet (R26.81);Muscle weakness (generalized) (M62.81);History of falling (Z91.81)   Activity Tolerance Patient tolerated treatment well   Patient Left in bed;with call bell/phone within reach;with family/visitor present   Nurse Communication          Time: 1062-6948 OT Time Calculation (min): 31 min  Charges: OT General Charges $OT Visit: 1 Visit OT Treatments $Therapeutic Activity: 8-22 mins  Sharyn Blitz OTR/L, MS Acute Rehabilitation Department Office# [phone] Pager# [phone]   Chalmers Guest Shacara Cozine 05/13/2021, 12:22 PM

## 2021-05-13 NOTE — TOC Transition Note (Signed)
Transition of Care [facility]) - CM/SW Discharge Note   Patient Details  Name: Sally Wise MRN: [id number] Date of Birth: 29-May-1946  Transition of Care [facility]) CM/SW Contact:  Ida Rogue, LCSW Phone Number: 05/13/2021, 11:53 AM   Clinical Narrative:   Patient who is stable for discharge will [facility] to [facility]. Family informed. PTAR arranged. Nursing, please call report to [phone]. TOC sign off.    Final next level of care: Skilled Nursing Facility Barriers to Discharge: Barriers Resolved   Patient Goals and CMS Choice     Choice offered to / list presented to : Patient, Spouse  Discharge Placement                       Discharge Plan and Services   Discharge Planning Services: CM Consult Post Acute Care Choice: Skilled Nursing Facility                               Social Determinants of Health (SDOH) Interventions     Readmission Risk Interventions Readmission Risk Prevention Plan 06/08/2020  Post Dischage Appt Complete  Medication Screening Complete  Transportation Screening Complete  Some recent data might be hidden

## 2021-05-15 ENCOUNTER — Encounter (HOSPITAL_COMMUNITY): Payer: Self-pay | Admitting: Orthopedic Surgery

## 2021-05-15 DIAGNOSIS — I5030 Unspecified diastolic (congestive) heart failure: Secondary | ICD-10-CM | POA: Diagnosis not present

## 2021-05-15 DIAGNOSIS — S82899A Other fracture of unspecified lower leg, initial encounter for closed fracture: Secondary | ICD-10-CM | POA: Diagnosis not present

## 2021-05-15 DIAGNOSIS — I4891 Unspecified atrial fibrillation: Secondary | ICD-10-CM | POA: Diagnosis not present

## 2021-05-15 DIAGNOSIS — D696 Thrombocytopenia, unspecified: Secondary | ICD-10-CM | POA: Diagnosis not present

## 2021-05-16 DIAGNOSIS — I4891 Unspecified atrial fibrillation: Secondary | ICD-10-CM | POA: Diagnosis not present

## 2021-05-16 DIAGNOSIS — D696 Thrombocytopenia, unspecified: Secondary | ICD-10-CM | POA: Diagnosis not present

## 2021-05-16 DIAGNOSIS — S82899A Other fracture of unspecified lower leg, initial encounter for closed fracture: Secondary | ICD-10-CM | POA: Diagnosis not present

## 2021-05-16 DIAGNOSIS — I5030 Unspecified diastolic (congestive) heart failure: Secondary | ICD-10-CM | POA: Diagnosis not present

## 2021-05-17 DIAGNOSIS — S82852D Displaced trimalleolar fracture of left lower leg, subsequent encounter for closed fracture with routine healing: Secondary | ICD-10-CM | POA: Diagnosis not present

## 2021-05-17 DIAGNOSIS — Z4789 Encounter for other orthopedic aftercare: Secondary | ICD-10-CM | POA: Diagnosis not present

## 2021-05-17 DIAGNOSIS — Z4889 Encounter for other specified surgical aftercare: Secondary | ICD-10-CM | POA: Diagnosis not present

## 2021-05-22 DIAGNOSIS — D696 Thrombocytopenia, unspecified: Secondary | ICD-10-CM | POA: Diagnosis not present

## 2021-05-22 DIAGNOSIS — I4891 Unspecified atrial fibrillation: Secondary | ICD-10-CM | POA: Diagnosis not present

## 2021-05-22 DIAGNOSIS — E039 Hypothyroidism, unspecified: Secondary | ICD-10-CM | POA: Diagnosis not present

## 2021-05-22 DIAGNOSIS — I5030 Unspecified diastolic (congestive) heart failure: Secondary | ICD-10-CM | POA: Diagnosis not present

## 2021-05-27 ENCOUNTER — Ambulatory Visit: Payer: Medicare Other | Admitting: Neurology

## 2021-05-29 DIAGNOSIS — R600 Localized edema: Secondary | ICD-10-CM | POA: Diagnosis not present

## 2021-05-29 DIAGNOSIS — S82899A Other fracture of unspecified lower leg, initial encounter for closed fracture: Secondary | ICD-10-CM | POA: Diagnosis not present

## 2021-05-29 DIAGNOSIS — I4891 Unspecified atrial fibrillation: Secondary | ICD-10-CM | POA: Diagnosis not present

## 2021-05-29 DIAGNOSIS — I5030 Unspecified diastolic (congestive) heart failure: Secondary | ICD-10-CM | POA: Diagnosis not present

## 2021-05-31 ENCOUNTER — Ambulatory Visit: Payer: Medicare Other | Admitting: Oncology

## 2021-05-31 ENCOUNTER — Other Ambulatory Visit: Payer: Medicare Other

## 2021-05-31 DIAGNOSIS — S82852D Displaced trimalleolar fracture of left lower leg, subsequent encounter for closed fracture with routine healing: Secondary | ICD-10-CM | POA: Diagnosis not present

## 2021-05-31 DIAGNOSIS — Z4889 Encounter for other specified surgical aftercare: Secondary | ICD-10-CM | POA: Diagnosis not present

## 2021-06-05 DIAGNOSIS — D696 Thrombocytopenia, unspecified: Secondary | ICD-10-CM | POA: Diagnosis not present

## 2021-06-05 DIAGNOSIS — E039 Hypothyroidism, unspecified: Secondary | ICD-10-CM | POA: Diagnosis not present

## 2021-06-05 DIAGNOSIS — I5030 Unspecified diastolic (congestive) heart failure: Secondary | ICD-10-CM | POA: Diagnosis not present

## 2021-06-05 DIAGNOSIS — S82899A Other fracture of unspecified lower leg, initial encounter for closed fracture: Secondary | ICD-10-CM | POA: Diagnosis not present

## 2021-06-06 DIAGNOSIS — M542 Cervicalgia: Secondary | ICD-10-CM | POA: Diagnosis not present

## 2021-06-06 DIAGNOSIS — M25512 Pain in left shoulder: Secondary | ICD-10-CM | POA: Diagnosis not present

## 2021-06-10 DIAGNOSIS — Z743 Need for continuous supervision: Secondary | ICD-10-CM | POA: Diagnosis not present

## 2021-06-10 DIAGNOSIS — R29898 Other symptoms and signs involving the musculoskeletal system: Secondary | ICD-10-CM | POA: Diagnosis not present

## 2021-06-11 DIAGNOSIS — R059 Cough, unspecified: Secondary | ICD-10-CM | POA: Diagnosis not present

## 2021-06-12 DIAGNOSIS — Z713 Dietary counseling and surveillance: Secondary | ICD-10-CM | POA: Diagnosis not present

## 2021-06-12 DIAGNOSIS — J189 Pneumonia, unspecified organism: Secondary | ICD-10-CM | POA: Diagnosis not present

## 2021-06-12 DIAGNOSIS — E039 Hypothyroidism, unspecified: Secondary | ICD-10-CM | POA: Diagnosis not present

## 2021-06-12 DIAGNOSIS — I5032 Chronic diastolic (congestive) heart failure: Secondary | ICD-10-CM | POA: Diagnosis not present

## 2021-06-24 ENCOUNTER — Observation Stay (HOSPITAL_COMMUNITY): Payer: Medicare Other

## 2021-06-24 ENCOUNTER — Inpatient Hospital Stay (HOSPITAL_COMMUNITY)
Admission: EM | Admit: 2021-06-24 | Discharge: 2021-07-01 | DRG: 682 | Disposition: A | Discharge status: Skilled Nursing Facility | Payer: Medicare Other | Source: Skilled Nursing Facility | Attending: Internal Medicine | Admitting: Internal Medicine

## 2021-06-24 ENCOUNTER — Encounter (HOSPITAL_COMMUNITY): Payer: Self-pay

## 2021-06-24 DIAGNOSIS — Z8249 Family history of ischemic heart disease and other diseases of the circulatory system: Secondary | ICD-10-CM | POA: Diagnosis not present

## 2021-06-24 DIAGNOSIS — R630 Anorexia: Secondary | ICD-10-CM | POA: Diagnosis present

## 2021-06-24 DIAGNOSIS — R627 Adult failure to thrive: Secondary | ICD-10-CM | POA: Diagnosis not present

## 2021-06-24 DIAGNOSIS — R531 Weakness: Secondary | ICD-10-CM | POA: Diagnosis not present

## 2021-06-24 DIAGNOSIS — F32A Depression, unspecified: Secondary | ICD-10-CM | POA: Diagnosis present

## 2021-06-24 DIAGNOSIS — R001 Bradycardia, unspecified: Secondary | ICD-10-CM | POA: Diagnosis not present

## 2021-06-24 DIAGNOSIS — J189 Pneumonia, unspecified organism: Secondary | ICD-10-CM | POA: Diagnosis not present

## 2021-06-24 DIAGNOSIS — I130 Hypertensive heart and chronic kidney disease with heart failure and stage 1 through stage 4 chronic kidney disease, or unspecified chronic kidney disease: Secondary | ICD-10-CM | POA: Diagnosis present

## 2021-06-24 DIAGNOSIS — D631 Anemia in chronic kidney disease: Secondary | ICD-10-CM | POA: Diagnosis not present

## 2021-06-24 DIAGNOSIS — I5032 Chronic diastolic (congestive) heart failure: Secondary | ICD-10-CM | POA: Diagnosis not present

## 2021-06-24 DIAGNOSIS — N1831 Chronic kidney disease, stage 3a: Secondary | ICD-10-CM | POA: Diagnosis not present

## 2021-06-24 DIAGNOSIS — I959 Hypotension, unspecified: Secondary | ICD-10-CM | POA: Diagnosis present

## 2021-06-24 DIAGNOSIS — Z8616 Personal history of COVID-19: Secondary | ICD-10-CM

## 2021-06-24 DIAGNOSIS — I480 Paroxysmal atrial fibrillation: Secondary | ICD-10-CM | POA: Diagnosis present

## 2021-06-24 DIAGNOSIS — Z978 Presence of other specified devices: Secondary | ICD-10-CM

## 2021-06-24 DIAGNOSIS — E860 Dehydration: Secondary | ICD-10-CM | POA: Diagnosis present

## 2021-06-24 DIAGNOSIS — N189 Chronic kidney disease, unspecified: Secondary | ICD-10-CM | POA: Diagnosis not present

## 2021-06-24 DIAGNOSIS — N200 Calculus of kidney: Secondary | ICD-10-CM | POA: Diagnosis not present

## 2021-06-24 DIAGNOSIS — R5381 Other malaise: Secondary | ICD-10-CM | POA: Diagnosis present

## 2021-06-24 DIAGNOSIS — Z820 Family history of epilepsy and other diseases of the nervous system: Secondary | ICD-10-CM

## 2021-06-24 DIAGNOSIS — F419 Anxiety disorder, unspecified: Secondary | ICD-10-CM | POA: Diagnosis not present

## 2021-06-24 DIAGNOSIS — E538 Deficiency of other specified B group vitamins: Secondary | ICD-10-CM | POA: Diagnosis not present

## 2021-06-24 DIAGNOSIS — J9811 Atelectasis: Secondary | ICD-10-CM | POA: Diagnosis present

## 2021-06-24 DIAGNOSIS — Z79890 Hormone replacement therapy (postmenopausal): Secondary | ICD-10-CM

## 2021-06-24 DIAGNOSIS — J90 Pleural effusion, not elsewhere classified: Secondary | ICD-10-CM | POA: Diagnosis present

## 2021-06-24 DIAGNOSIS — I10 Essential (primary) hypertension: Secondary | ICD-10-CM | POA: Diagnosis not present

## 2021-06-24 DIAGNOSIS — D696 Thrombocytopenia, unspecified: Secondary | ICD-10-CM | POA: Diagnosis not present

## 2021-06-24 DIAGNOSIS — Z09 Encounter for follow-up examination after completed treatment for conditions other than malignant neoplasm: Secondary | ICD-10-CM

## 2021-06-24 DIAGNOSIS — E8809 Other disorders of plasma-protein metabolism, not elsewhere classified: Secondary | ICD-10-CM | POA: Diagnosis not present

## 2021-06-24 DIAGNOSIS — S8252XD Displaced fracture of medial malleolus of left tibia, subsequent encounter for closed fracture with routine healing: Secondary | ICD-10-CM

## 2021-06-24 DIAGNOSIS — S82899A Other fracture of unspecified lower leg, initial encounter for closed fracture: Secondary | ICD-10-CM | POA: Diagnosis present

## 2021-06-24 DIAGNOSIS — L8990 Pressure ulcer of unspecified site, unspecified stage: Secondary | ICD-10-CM | POA: Diagnosis present

## 2021-06-24 DIAGNOSIS — R0602 Shortness of breath: Secondary | ICD-10-CM

## 2021-06-24 DIAGNOSIS — Z833 Family history of diabetes mellitus: Secondary | ICD-10-CM

## 2021-06-24 DIAGNOSIS — N179 Acute kidney failure, unspecified: Secondary | ICD-10-CM | POA: Diagnosis not present

## 2021-06-24 DIAGNOSIS — L89890 Pressure ulcer of other site, unstageable: Secondary | ICD-10-CM | POA: Diagnosis not present

## 2021-06-24 DIAGNOSIS — L89153 Pressure ulcer of sacral region, stage 3: Secondary | ICD-10-CM | POA: Diagnosis present

## 2021-06-24 DIAGNOSIS — D649 Anemia, unspecified: Secondary | ICD-10-CM | POA: Diagnosis not present

## 2021-06-24 DIAGNOSIS — Z841 Family history of disorders of kidney and ureter: Secondary | ICD-10-CM | POA: Diagnosis not present

## 2021-06-24 DIAGNOSIS — E039 Hypothyroidism, unspecified: Secondary | ICD-10-CM | POA: Diagnosis present

## 2021-06-24 DIAGNOSIS — Z79899 Other long term (current) drug therapy: Secondary | ICD-10-CM

## 2021-06-24 DIAGNOSIS — Z7901 Long term (current) use of anticoagulants: Secondary | ICD-10-CM

## 2021-06-24 DIAGNOSIS — Z96653 Presence of artificial knee joint, bilateral: Secondary | ICD-10-CM | POA: Diagnosis present

## 2021-06-24 DIAGNOSIS — R400 Somnolence: Secondary | ICD-10-CM | POA: Diagnosis present

## 2021-06-24 DIAGNOSIS — S82852D Displaced trimalleolar fracture of left lower leg, subsequent encounter for closed fracture with routine healing: Secondary | ICD-10-CM | POA: Diagnosis not present

## 2021-06-24 DIAGNOSIS — R601 Generalized edema: Secondary | ICD-10-CM

## 2021-06-24 DIAGNOSIS — R600 Localized edema: Secondary | ICD-10-CM | POA: Diagnosis present

## 2021-06-24 DIAGNOSIS — R441 Visual hallucinations: Secondary | ICD-10-CM | POA: Diagnosis not present

## 2021-06-24 DIAGNOSIS — Z6841 Body Mass Index (BMI) 40.0 and over, adult: Secondary | ICD-10-CM | POA: Diagnosis not present

## 2021-06-24 DIAGNOSIS — Z4889 Encounter for other specified surgical aftercare: Secondary | ICD-10-CM | POA: Diagnosis not present

## 2021-06-24 LAB — CBC WITH DIFFERENTIAL/PLATELET
Abs Immature Granulocytes: 0.05 10*3/uL (ref 0.00–0.07)
Basophils Absolute: 0 10*3/uL (ref 0.0–0.1)
Basophils Relative: 0 %
Eosinophils Absolute: 0.2 10*3/uL (ref 0.0–0.5)
Eosinophils Relative: 2 %
HCT: 26.6 % — ABNORMAL LOW (ref 36.0–46.0)
Hemoglobin: 8.3 g/dL — ABNORMAL LOW (ref 12.0–15.0)
Immature Granulocytes: 1 %
Lymphocytes Relative: 21 %
Lymphs Abs: 2 10*3/uL (ref 0.7–4.0)
MCH: 30.6 pg (ref 26.0–34.0)
MCHC: 31.2 g/dL (ref 30.0–36.0)
MCV: 98.2 fL (ref 80.0–100.0)
Monocytes Absolute: 1.1 10*3/uL — ABNORMAL HIGH (ref 0.1–1.0)
Monocytes Relative: 11 %
Neutro Abs: 6.1 10*3/uL (ref 1.7–7.7)
Neutrophils Relative %: 65 %
Platelets: 56 10*3/uL — ABNORMAL LOW (ref 150–400)
RBC: 2.71 MIL/uL — ABNORMAL LOW (ref 3.87–5.11)
RDW: 22.8 % — ABNORMAL HIGH (ref 11.5–15.5)
WBC: 9.3 10*3/uL (ref 4.0–10.5)
nRBC: 0.5 % — ABNORMAL HIGH (ref 0.0–0.2)

## 2021-06-24 LAB — COMPREHENSIVE METABOLIC PANEL
ALT: 18 U/L (ref 0–44)
AST: 13 U/L — ABNORMAL LOW (ref 15–41)
Albumin: 2.4 g/dL — ABNORMAL LOW (ref 3.5–5.0)
Alkaline Phosphatase: 273 U/L — ABNORMAL HIGH (ref 38–126)
Anion gap: 11 (ref 5–15)
BUN: 62 mg/dL — ABNORMAL HIGH (ref 8–23)
CO2: 22 mmol/L (ref 22–32)
Calcium: 8 mg/dL — ABNORMAL LOW (ref 8.9–10.3)
Chloride: 101 mmol/L (ref 98–111)
Creatinine, Ser: 2.44 mg/dL — ABNORMAL HIGH (ref 0.44–1.00)
GFR, Estimated: 20 mL/min — ABNORMAL LOW (ref >60)
Glucose, Bld: 118 mg/dL — ABNORMAL HIGH (ref 70–99)
Potassium: 4.3 mmol/L (ref 3.5–5.1)
Sodium: 134 mmol/L — ABNORMAL LOW (ref 135–145)
Total Bilirubin: 0.9 mg/dL (ref 0.3–1.2)
Total Protein: 5.6 g/dL — ABNORMAL LOW (ref 6.5–8.1)

## 2021-06-24 LAB — MAGNESIUM: Magnesium: 2.3 mg/dL (ref 1.7–2.4)

## 2021-06-24 LAB — CBG MONITORING, ED: Glucose-Capillary: 98 mg/dL (ref 70–99)

## 2021-06-24 LAB — TSH: TSH: 9.202 u[IU]/mL — ABNORMAL HIGH (ref 0.350–4.500)

## 2021-06-24 LAB — TROPONIN I (HIGH SENSITIVITY)
Troponin I (High Sensitivity): 8 ng/L (ref <18)
Troponin I (High Sensitivity): 8 ng/L (ref <18)

## 2021-06-24 MED ORDER — ACETAMINOPHEN 325 MG PO TABS
650.0000 mg | ORAL_TABLET | Freq: Four times a day (QID) | ORAL | Status: DC | PRN
  Administered 2021-06-25: 650 mg via ORAL
  Filled 2021-06-24 (×2): qty 2

## 2021-06-24 MED ORDER — ACETAMINOPHEN 650 MG RE SUPP: 650.0000 mg | Freq: Four times a day (QID) | RECTAL | Status: DC | PRN

## 2021-06-24 MED ORDER — SODIUM CHLORIDE 0.9 % IV SOLN
INTRAVENOUS | Status: AC
  Administered 2021-06-24: 100 mL via INTRAVENOUS

## 2021-06-24 MED ORDER — PROCHLORPERAZINE EDISYLATE 10 MG/2ML IJ SOLN: 10.0000 mg | Freq: Four times a day (QID) | INTRAMUSCULAR | Status: DC | PRN

## 2021-06-24 MED ORDER — SODIUM CHLORIDE 0.9 % IV BOLUS
1000.0000 mL | Freq: Once | INTRAVENOUS | Status: AC
  Administered 2021-06-24: 1000 mL via INTRAVENOUS

## 2021-06-24 MED ORDER — ACETAMINOPHEN 325 MG PO TABS
650.0000 mg | ORAL_TABLET | Freq: Once | ORAL | Status: AC
  Administered 2021-06-24: 650 mg via ORAL
  Filled 2021-06-24: qty 2

## 2021-06-24 MED ORDER — SENNOSIDES-DOCUSATE SODIUM 8.6-50 MG PO TABS
1.0000 | ORAL_TABLET | Freq: Every evening | ORAL | Status: DC | PRN
  Administered 2021-06-29: 1 via ORAL
  Filled 2021-06-24: qty 1

## 2021-06-24 NOTE — ED Notes (Signed)
PT BIBA for weakness s/p left ankle sx approximately 2 weeks ago. Pt wearing boot. A/ox4, poor historian, denies CP, SOB, ABD pain, n/v/d. Pt c/o rectal pain and had soiled herself on arrival with soft BM. Skin cool, pale. Delayed cap refill, weak radial pulses. LS clear bilaterally.

## 2021-06-24 NOTE — H&P (Signed)
History and Physical    Sally Wise HER:[id number] DOB: 12-27-1945 DOA: 06/24/2021  PCP: [name], MD  Patient coming from: SNF via EMS  I have personally briefly reviewed patient's old medical records in [place]  Chief Complaint: Weakness  HPI: Sally Wise is a 75 y.o. female with medical history significant for paroxysmal atrial fibrillation, hypothyroidism, anemia and thrombocytopenia, depression/anxiety, morbid obesity who presented to the ED from SNF for evaluation of weakness.  Patient had recent left ankle fracture and underwent ORIF 10/25.  She has been in a SNF since then has been functionally declining and feeling generally weak.  She was seen by her orthopedic team earlier today and noted to be pale and fatigued.  She was subsequently sent to the ED.  Patient states that she is generally weak.  She has not yet able to walk even with therapy.  She uses a wheelchair at her nursing facility.  She reports nausea without emesis.  She has chronic issues with loose stools which she says is unchanged from baseline.  She reports poor oral intake recently.  She denies any dysuria, she is unable to characterize volume of urine output.  ED Course:  Initial vitals showed BP 139/50, pulse 81, RR 12, temp 97.3 F, SPO2 100% on room air.  While in the ED patient was hypotensive with BP as low as 83/48 and MAP 60.  Labs show WBC 9.3, hemoglobin 8.3, platelets 56,000 (previously 95,000 on 05/10/2021), sodium 134, potassium 4.3, bicarb 22, BUN 62, creatinine 2.44 (baseline 1.0-1.1), serum glucose 118, AST 13, ALT 18, alk phos 273, total bilirubin 0.9, troponin 8, magnesium 2.3.  Patient was given 1 L normal saline with some improvement in blood pressure.  The hospitalist service was consulted to admit for further evaluation and management of AKI on CKD 3A.  Review of Systems:  All systems reviewed and are negative except as documented in history of present illness  above.   Past Medical History:  Diagnosis Date   Hematuria    evaluation with Dr. Joelyn Oms   Hypothyroidism 06/2010   Kidney stone 85/6314   Lichen sclerosus et atrophicus 2/13   biopsy proven   LVH (left ventricular hypertrophy)    Morbid obesity (HCC)    PAF (paroxysmal atrial fibrillation) ([place])    a. Remote hx, re-established care 01/2013 with Dr. Johnsie Cancel for recurrence. 2D echo 01/2013: mod LVH, EF 55-65%, no RWMA, grade 1 d/d. b. 06/2014: started on Xarelto.    Thrombocytopenia ([place])    Vitamin D deficiency 06/2010   Wears glasses     Past Surgical History:  Procedure Laterality Date   BREAST EXCISIONAL BIOPSY  2010   left--was a vascular lesion   CARPAL TUNNEL RELEASE Left 08/09/2013   Procedure: LEFT CARPAL TUNNEL RELEASE;  Surgeon: Cammie Sickle., MD;  Location: [facility];  Service: Orthopedics;  Laterality: Left;   CATARACT EXTRACTION     COLONOSCOPY  04/2003       DILATION AND CURETTAGE OF UTERUS     HYSTEROSCOPY WITH D & C  8/99   ORIF ANKLE FRACTURE Left 05/07/2021   Procedure: OPEN REDUCTION INTERNAL FIXATION (ORIF) ANKLE FRACTURE;  Surgeon: Wylene Simmer, MD;  Location: [facility];  Service: Orthopedics;  Laterality: Left;   REPLACEMENT TOTAL KNEE Bilateral 12/2006  07/2007    TONSILLECTOMY     TRIGGER FINGER RELEASE Left 08/09/2013   Procedure: LEFT A1/A2 PULLEY RELEASE;  Surgeon: Cammie Sickle., MD;  Location: [place];  Service: Orthopedics;  Laterality: Left;   TUBAL LIGATION  5/77    Social History:  reports that she has never smoked. She has never used smokeless tobacco. She reports that she does not drink alcohol and does not use drugs.  No Known Allergies  Family History  Problem Relation Age of Onset   Hypertension Mother    Kidney disease Mother    Alzheimer's disease Father    Diabetes Brother    Hypertension Brother    Spina bifida Daughter      Prior to Admission medications   Medication Sig Start Date  End Date Taking? Authorizing Provider  busPIRone (BUSPAR) 15 MG tablet Take 15 mg by mouth in the morning and at bedtime. 02/24/21   [provider]  Cholecalciferol (VITAMIN D3) 5000 UNITS TABS Take 1 tablet by mouth daily.    [provider]  diltiazem (CARDIZEM CD) 180 MG 24 hr capsule TAKE 1 CAPSULE BY MOUTH EVERY DAY Patient taking differently: Take 180 mg by mouth in the morning. 08/31/20   Josue Hector, MD  docusate sodium (COLACE) 100 MG capsule Take 1 capsule (100 mg total) by mouth 2 (two) times daily. While taking narcotic pain medicine. 05/08/21   Corky Sing, PA-C  EUTHYROX 112 MCG tablet Take 112 mcg by mouth daily before breakfast.    [provider]  folic acid (FOLVITE) 1 MG tablet Take 1 tablet (1 mg total) by mouth daily. 03/17/21   Mercy Riding, MD  loperamide (IMODIUM) 2 MG capsule Take 1 capsule (2 mg total) by mouth every 4 (four) hours as needed for diarrhea or loose stools. 03/17/21   Mercy Riding, MD  rivaroxaban (XARELTO) 10 MG TABS tablet Take 1 tablet (10 mg total) by mouth daily. 05/08/21   Corky Sing, PA-C  senna (SENOKOT) 8.6 MG TABS tablet Take 2 tablets (17.2 mg total) by mouth 2 (two) times daily. 05/08/21   Corky Sing, PA-C  sertraline (ZOLOFT) 50 MG tablet Take 50 mg by mouth in the morning. 02/21/21   [provider]    Physical Exam: Vitals:   06/24/21 1811 06/24/21 1830 06/24/21 1905 06/24/21 2020  BP: (!) 95/50 103/79 (!) 137/100 (!) 100/52  Pulse:  69 70 68  Resp: _0 Temp:      TempSrc:      SpO2: 98% 100% 100% 100%  Weight:      Height:      Exam limited due to body habitus Constitutional: Morbidly obese woman resting supine in bed, NAD, calm Eyes: PERRL, lids and conjunctivae normal ENMT: Mucous membranes are moist. Posterior pharynx clear of any exudate or lesions.Normal dentition.  Neck: normal, supple, no masses. Respiratory: clear to auscultation anteriorly. Normal  respiratory effort. No accessory muscle use.  Cardiovascular: Regular rate and rhythm, no murmurs / rubs / gallops.  +2 extremity edema at bilateral ankles. 2+ pedal pulses. Abdomen: no tenderness, no masses palpated. No hepatosplenomegaly. Bowel sounds positive.  Musculoskeletal: Walking boot in place left ankle.  No clubbing / cyanosis. No joint deformity upper and lower extremities. No contractures. Normal muscle tone.  Skin: no rashes, lesions, ulcers. No induration Neurologic: CN 2-12 grossly intact. Sensation intact. Strength equal bilaterally. Psychiatric: Alert and oriented x 3.  Depressed mood.   Labs on Admission: I have personally reviewed following labs and imaging studies  CBC: Recent Labs  Lab 06/24/21 1807  WBC 9.3  NEUTROABS 6.1  HGB 8.3*  HCT 26.6*  MCV 98.2  PLT 56*   Basic Metabolic Panel: Recent Labs  Lab 06/24/21 1807  NA 134*  K 4.3  CL 101  CO2 22  GLUCOSE 118*  BUN 62*  CREATININE 2.44*  CALCIUM 8.0*  MG 2.3   GFR: Estimated Creatinine Clearance: 28.6 mL/min (A) (by C-G formula based on SCr of 2.44 mg/dL (H)). Liver Function Tests: Recent Labs  Lab 06/24/21 1807  AST 13*  ALT 18  ALKPHOS 273*  BILITOT 0.9  PROT 5.6*  ALBUMIN 2.4*   No results for input(s): LIPASE, AMYLASE in the last 168 hours. No results for input(s): AMMONIA in the last 168 hours. Coagulation Profile: No results for input(s): INR, PROTIME in the last 168 hours. Cardiac Enzymes: No results for input(s): CKTOTAL, CKMB, CKMBINDEX, TROPONINI in the last 168 hours. BNP (last 3 results) No results for input(s): PROBNP in the last 8760 hours. HbA1C: No results for input(s): HGBA1C in the last 72 hours. CBG: Recent Labs  Lab 06/24/21 1655  GLUCAP 98   Lipid Profile: No results for input(s): CHOL, HDL, LDLCALC, TRIG, CHOLHDL, LDLDIRECT in the last 72 hours. Thyroid Function Tests: No results for input(s): TSH, T4TOTAL, FREET4, T3FREE, THYROIDAB in the last 72  hours. Anemia Panel: No results for input(s): VITAMINB12, FOLATE, FERRITIN, TIBC, IRON, RETICCTPCT in the last 72 hours. Urine analysis:    Component Value Date/Time   COLORURINE GREEN (A) 06/05/2020 1800   APPEARANCEUR CLEAR 06/05/2020 1800   LABSPEC 1.015 06/05/2020 1800   PHURINE 5.5 06/05/2020 1800   GLUCOSEU NEGATIVE 06/05/2020 1800   HGBUR TRACE (A) 06/05/2020 1800   BILIRUBINUR NEGATIVE 06/05/2020 1800   BILIRUBINUR neg 04/08/2016 0916   KETONESUR NEGATIVE 06/05/2020 1800   PROTEINUR NEGATIVE 06/05/2020 1800   UROBILINOGEN negative 04/08/2016 0916   UROBILINOGEN 0.2 01/15/2013 1538   NITRITE NEGATIVE 06/05/2020 1800   LEUKOCYTESUR NEGATIVE 06/05/2020 1800    Radiological Exams on Admission: No results found.  EKG: Personally reviewed. Sinus rhythm with low voltage, similar to prior.  Assessment/Plan Principal Problem:   Acute kidney injury superimposed on chronic kidney disease ([place]) Active Problems:   Hypothyroidism   PAF (paroxysmal atrial fibrillation) (HCC)   Thrombocytopenia (HCC)   Anemia   Hypotension   Sally Wise is a 75 y.o. female with medical history significant for paroxysmal atrial fibrillation, hypothyroidism, anemia and thrombocytopenia, depression/anxiety, morbid obesity who is admitted with AKI on CKD stage IIIa.  Acute kidney injury superimposed on CKD stage IIIa: Creatinine 2.44 on admission compared to baseline 1-1.1.  Overall seems to be dehydrated given hypotension and poor oral intake although does have some edema around both ankles and exam difficult to assess volume due to her body habitus. -Start IV NS_0  mL/hour overnight -Obtain urine ultrasound -Monitor UOP -Repeat labs in a.m.  Hypotension: Suspect overall volume depleted.  Continue IV fluids as above.  Hold diltiazem for now.  Generalized weakness/recent left ankle fracture s/p ORIF 10/25: Deconditioned since left ankle fracture.  Continue IV fluid hydration.  Request  PT/OT eval.  Walking boot in place left ankle.  Paroxysmal atrial fibrillation: In sinus rhythm on admission with intermittent bradycardia.  EKG shows low voltage, similar to prior.  Diltiazem on hold due to hypotension and bradycardia.  Patient states that she is no longer taking Xarelto (was briefly on it for postsurgical DVT prophylaxis).  Holding anticoagulation given worsening thrombocytopenia.  Acute on chronic thrombocytopenia: Platelets 56,000 on admission compared to recent baseline around 90,000.  Denies any obvious bleeding.  Anticoagulation on hold.  Previously evaluated by hematology, Dr. Alen Blew, in May 2022 with differential including reactive thrombocytopenia with element of ITP.  Primary hematological conditions such as myelodysplastic syndrome was considered less likely.  Anemia of chronic disease/folate deficiency: Hemoglobin relatively stable.  Continue folate.  Hypothyroidism: Continue Euthyrox.  Depression/anxiety: Continue sertraline and BuSpar.  DVT prophylaxis: SCDs given thrombocytopenia Code Status: Full code, confirmed with patient on admission Family Communication: Discussed with patient's husband by phone Disposition Plan: From SNF and likely return to SNF pending clinical progress Consults called: None Level of care: Med-Surg Admission status:  Status is: Observation  The patient remains OBS appropriate and will d/c before 2 midnights.  Zada Finders MD [facility]  If 7PM-7AM, please contact night-coverage [email or web address]  06/24/2021, 8:59 PM

## 2021-06-24 NOTE — ED Notes (Signed)
Pt given warm blanket per request.

## 2021-06-24 NOTE — ED Notes (Signed)
US at bedside

## 2021-06-24 NOTE — ED Provider Notes (Addendum)
[facility] DEPT Provider Note   CSN: [id number] Arrival date & time: 06/24/21  1615     History Chief Complaint  Patient presents with   Weakness    Sally Wise is a 75 y.o. female.  Presents with nursing home chief complaint of generalized fatigue generalized weakness.  Patient denies any fevers or cough denies any vomiting or diarrhea.  She states that she is too weak to do anything.  She was recently admitted about 2 to 3 weeks ago after left ankle surgery and has been staying at a nursing home.  She is complaining of increased swelling in bilateral lower extremities.      Past Medical History:  Diagnosis Date   Hematuria    evaluation with Dr. Joelyn Oms   Hypothyroidism 06/2010   Kidney stone [id number]   Lichen sclerosus et atrophicus 2/13   biopsy proven   LVH (left ventricular hypertrophy)    Morbid obesity (HCC)    PAF (paroxysmal atrial fibrillation) ([place])    a. Remote hx, re-established care 01/2013 with Dr. Johnsie Cancel for recurrence. 2D echo 01/2013: mod LVH, EF 55-65%, no RWMA, grade 1 d/d. b. 06/2014: started on Xarelto.    Thrombocytopenia ([place])    Vitamin D deficiency 06/2010   Wears glasses     Patient Active Problem List   Diagnosis Date Noted   Acute kidney injury superimposed on chronic kidney disease ([place]) 06/24/2021   Hypotension 06/24/2021   Ankle fracture 04/28/2021   Chronic diastolic CHF (congestive heart failure) ([place]) 04/28/2021   Anemia 04/28/2021   Prolonged QT interval 04/28/2021   Mucoid diarrhea 03/16/2021   Diarrhea 03/14/2021   Acute on chronic diastolic HF (heart failure) ([place]) 08/24/2020   Body mass index (BMI) 50.0-59.9, adult ([place]) 08/24/2020   Chronic kidney disease with active medical management without dialysis, stage 3 (moderate) ([place]) 08/24/2020   Combined rheumatic disorders of mitral, 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   Congestive rheumatic heart failure ([place]) 08/24/2020   Hardening of the  aorta (main artery of the heart) ([place]) 08/24/2020   Kidney stone 08/24/2020   Pure hypercholesterolemia 08/24/2020   Scoliosis of thoracic spine 08/24/2020   Solitary pulmonary nodule 08/24/2020   Other long term (current) drug therapy 08/24/2020   Abnormal findings on diagnostic imaging of other specified body structures 08/24/2020   Acute respiratory failure with hypoxia ([place]) 06/04/2020   Multifocal pneumonia 06/03/2020   Low back pain 12/12/2019   History of total knee replacement, right 0000000   Lichen sclerosus et atrophicus 01/04/2015   LVH (left ventricular hypertrophy)    PAF (paroxysmal atrial fibrillation) ([place]) 07/06/2014   Thrombocytopenia ([place]) 07/06/2014   Hyperglycemia 07/06/2014   Morbid obesity ([place]) 01/25/2013   Hypothyroidism 01/15/2013   History of total knee replacement, left 08/09/2007    Past Surgical History:  Procedure Laterality Date   BREAST EXCISIONAL BIOPSY  2010   left--was a vascular lesion   CARPAL TUNNEL RELEASE Left 08/09/2013   Procedure: LEFT CARPAL TUNNEL RELEASE;  Surgeon: Cammie Sickle., MD;  Location: [facility];  Service: Orthopedics;  Laterality: Left;   CATARACT EXTRACTION     COLONOSCOPY  04/2003       DILATION AND CURETTAGE OF UTERUS     HYSTEROSCOPY WITH D & C  8/99   ORIF ANKLE FRACTURE Left 05/07/2021   Procedure: OPEN REDUCTION INTERNAL FIXATION (ORIF) ANKLE FRACTURE;  Surgeon: Wylene Simmer, MD;  Location: [facility];  Service: Orthopedics;  Laterality: Left;  REPLACEMENT TOTAL KNEE Bilateral 12/2006  07/2007    TONSILLECTOMY     TRIGGER FINGER RELEASE Left 08/09/2013   Procedure: LEFT A1/A2 PULLEY RELEASE;  Surgeon: Cammie Sickle., MD;  Location: [facility];  Service: Orthopedics;  Laterality: Left;   TUBAL LIGATION  5/77     OB History     Gravida  2   Para  2   Term  2   Preterm  0   AB  0   Living  2      SAB  0   IAB  0   Ectopic  0   Multiple  0   Live Births   2           Family History  Problem Relation Age of Onset   Hypertension Mother    Kidney disease Mother    Alzheimer's disease Father    Diabetes Brother    Hypertension Brother    Spina bifida Daughter     Social History   Tobacco Use   Smoking status: Never   Smokeless tobacco: Never  Vaping Use   Vaping Use: Never used  Substance Use Topics   Alcohol use: No   Drug use: No    Home Medications Prior to Admission medications   Medication Sig Start Date End Date Taking? Authorizing Provider  busPIRone (BUSPAR) 15 MG tablet Take 15 mg by mouth in the morning and at bedtime. 02/24/21   [provider]  Cholecalciferol (VITAMIN D3) 5000 UNITS TABS Take 1 tablet by mouth daily.    [provider]  diltiazem (CARDIZEM CD) 180 MG 24 hr capsule TAKE 1 CAPSULE BY MOUTH EVERY DAY Patient taking differently: Take 180 mg by mouth in the morning. 08/31/20   Josue Hector, MD  docusate sodium (COLACE) 100 MG capsule Take 1 capsule (100 mg total) by mouth 2 (two) times daily. While taking narcotic pain medicine. 05/08/21   Corky Sing, PA-C  EUTHYROX 112 MCG tablet Take 112 mcg by mouth daily before breakfast.    [provider]  folic acid (FOLVITE) 1 MG tablet Take 1 tablet (1 mg total) by mouth daily. 03/17/21   Mercy Riding, MD  loperamide (IMODIUM) 2 MG capsule Take 1 capsule (2 mg total) by mouth every 4 (four) hours as needed for diarrhea or loose stools. 03/17/21   Mercy Riding, MD  rivaroxaban (XARELTO) 10 MG TABS tablet Take 1 tablet (10 mg total) by mouth daily. 05/08/21   Corky Sing, PA-C  senna (SENOKOT) 8.6 MG TABS tablet Take 2 tablets (17.2 mg total) by mouth 2 (two) times daily. 05/08/21   Corky Sing, PA-C  sertraline (ZOLOFT) 50 MG tablet Take 50 mg by mouth in the morning. 02/21/21   [provider]    Allergies    Patient has no known allergies.  Review of Systems   Review of Systems  Constitutional:   Negative for fever.  HENT:  Negative for ear pain.   Eyes:  Negative for pain.  Respiratory:  Negative for cough.   Cardiovascular:  Negative for chest pain.  Gastrointestinal:  Negative for abdominal pain.  Genitourinary:  Negative for flank pain.  Musculoskeletal:  Negative for back pain.  Skin:  Negative for rash.  Neurological:  Negative for headaches.   Physical Exam Updated Vital Signs BP (!) 100/52   Pulse 68   Temp (!) 97.3 F (36.3 C) (Oral)   Resp 16  Ht 5\' 8"  (1.727 m)   Wt (!) 138.3 kg   LMP 07/14/2001   SpO2 100%   BMI 46.38 kg/m   Physical Exam  ED Results / Procedures / Treatments   Labs (all labs ordered are listed, but only abnormal results are displayed) Labs Reviewed  CBC WITH DIFFERENTIAL/PLATELET - Abnormal; Notable for the following components:      Result Value   RBC 2.71 (*)    Hemoglobin 8.3 (*)    HCT 26.6 (*)    RDW 22.8 (*)    Platelets 56 (*)    nRBC 0.5 (*)    Monocytes Absolute 1.1 (*)    All other components within normal limits  COMPREHENSIVE METABOLIC PANEL - Abnormal; Notable for the following components:   Sodium 134 (*)    Glucose, Bld 118 (*)    BUN 62 (*)    Creatinine, Ser 2.44 (*)    Calcium 8.0 (*)    Total Protein 5.6 (*)    Albumin 2.4 (*)    AST 13 (*)    Alkaline Phosphatase 273 (*)    GFR, Estimated 20 (*)    All other components within normal limits  TSH - Abnormal; Notable for the following components:   TSH 9.202 (*)    All other components within normal limits  URINE CULTURE  MAGNESIUM  URINALYSIS, ROUTINE W REFLEX MICROSCOPIC  SODIUM, URINE, RANDOM  CREATININE, URINE, RANDOM  BASIC METABOLIC PANEL  CBC  CBG MONITORING, ED  TROPONIN I (HIGH SENSITIVITY)  TROPONIN I (HIGH SENSITIVITY)    EKG EKG Interpretation  Date/Time:  Monday June 24 2021 17:05:26 EST Ventricular Rate:  78 PR Interval:    QRS Duration: 86 QT Interval:  398 QTC Calculation: 454 R Axis:   49 Text  Interpretation: Accelerated junctional rhythm Low voltage, extremity and precordial leads Confirmed by Thamas Jaegers ([id number]) on 06/24/2021 5:37:38 PM  Radiology US RENAL  Result Date: 06/24/2021 CLINICAL DATA:  Acute on chronic renal insufficiency, renal calculus EXAM: RENAL / URINARY TRACT ULTRASOUND COMPLETE COMPARISON:  05/01/2021 FINDINGS: Right Kidney: Renal measurements: 10.1 x 4.5 x 5.6 cm = volume: 134 mL. Echogenicity within normal limits. No mass or hydronephrosis visualized. Left Kidney: Not visualized due to patient body habitus and bowel gas. Bladder: Decompressed, limiting evaluation. Other: None. IMPRESSION: 1. Nonvisualization of the left kidney due to bowel gas and patient body habitus. 2. Unremarkable right kidney. Electronically Signed   By: Randa Ngo M.D.   On: 06/24/2021 22:43    Procedures .Critical Care Performed by: Luna Fuse, MD Authorized by: Luna Fuse, MD   Critical care provider statement:    Critical care time (minutes):  40   Critical care was necessary to treat or prevent imminent or life-threatening deterioration of the following conditions:  Circulatory failure Comments:     Hypotensive episode, severe dehydration.   Medications Ordered in ED Medications  0.9 %  sodium chloride infusion (100 mLs Intravenous New Bag/Given 06/24/21 2227)  acetaminophen (TYLENOL) tablet 650 mg (has no administration in time range)    Or  acetaminophen (TYLENOL) suppository 650 mg (has no administration in time range)  senna-docusate (Senokot-S) tablet 1 tablet (has no administration in time range)  prochlorperazine (COMPAZINE) injection 10 mg (has no administration in time range)  sodium chloride 0.9 % bolus 1,000 mL (0 mLs Intravenous Stopped 06/24/21 2052)  acetaminophen (TYLENOL) tablet 650 mg (650 mg Oral Given 06/24/21 1943)    ED Course  I have reviewed  the triage vital signs and the nursing notes.  Pertinent labs & imaging results that were available  during my care of the patient were reviewed by me and considered in my medical decision making (see chart for details).    MDM Rules/Calculators/A&P                           On exam patient has significant edema and swelling to lower extremities.  Concern for anasarca.  Sharp acute kidney injury and evidence of dehydration patient able to make urine.  Patient had an episode of hypotension blood pressure dropped into the 80s.  IV fluids were given and subsequently improved.  IV fluids given, on reassessments, blood pressure appears improved.  Patient admitted to the hospitalist team.  Final Clinical Impression(s) / ED Diagnoses Final diagnoses:  Dehydration  AKI (acute kidney injury) (HCC)  Anasarca    Rx / DC Orders ED Discharge Orders     None        Cheryll Cockayne, MD 06/24/21 2333    Cheryll Cockayne, MD 06/24/21 [phone]

## 2021-06-24 NOTE — ED Triage Notes (Signed)
Pt BIBA for weakness s/p left ankle surgery approximately 2 weeks ago

## 2021-06-24 NOTE — ED Notes (Addendum)
Pt diaper and purewick changed from BM. Barrier cream applied to pannus and legs. Pt states her rectal pain is better.

## 2021-06-24 NOTE — ED Notes (Signed)
MD Patel at bedside.

## 2021-06-25 ENCOUNTER — Observation Stay (HOSPITAL_COMMUNITY): Payer: Medicare Other

## 2021-06-25 ENCOUNTER — Other Ambulatory Visit: Payer: Self-pay

## 2021-06-25 DIAGNOSIS — Z6841 Body Mass Index (BMI) 40.0 and over, adult: Secondary | ICD-10-CM | POA: Diagnosis not present

## 2021-06-25 DIAGNOSIS — R609 Edema, unspecified: Secondary | ICD-10-CM | POA: Diagnosis not present

## 2021-06-25 DIAGNOSIS — E538 Deficiency of other specified B group vitamins: Secondary | ICD-10-CM | POA: Diagnosis not present

## 2021-06-25 DIAGNOSIS — Z841 Family history of disorders of kidney and ureter: Secondary | ICD-10-CM | POA: Diagnosis not present

## 2021-06-25 DIAGNOSIS — N179 Acute kidney failure, unspecified: Secondary | ICD-10-CM | POA: Diagnosis not present

## 2021-06-25 DIAGNOSIS — L8990 Pressure ulcer of unspecified site, unspecified stage: Secondary | ICD-10-CM | POA: Diagnosis present

## 2021-06-25 DIAGNOSIS — N189 Chronic kidney disease, unspecified: Secondary | ICD-10-CM | POA: Diagnosis present

## 2021-06-25 DIAGNOSIS — I959 Hypotension, unspecified: Secondary | ICD-10-CM | POA: Diagnosis not present

## 2021-06-25 DIAGNOSIS — L89153 Pressure ulcer of sacral region, stage 3: Secondary | ICD-10-CM | POA: Diagnosis not present

## 2021-06-25 DIAGNOSIS — D696 Thrombocytopenia, unspecified: Secondary | ICD-10-CM | POA: Diagnosis not present

## 2021-06-25 DIAGNOSIS — F419 Anxiety disorder, unspecified: Secondary | ICD-10-CM | POA: Diagnosis not present

## 2021-06-25 DIAGNOSIS — R627 Adult failure to thrive: Secondary | ICD-10-CM | POA: Diagnosis not present

## 2021-06-25 DIAGNOSIS — D631 Anemia in chronic kidney disease: Secondary | ICD-10-CM | POA: Diagnosis not present

## 2021-06-25 DIAGNOSIS — I480 Paroxysmal atrial fibrillation: Secondary | ICD-10-CM | POA: Diagnosis not present

## 2021-06-25 DIAGNOSIS — E039 Hypothyroidism, unspecified: Secondary | ICD-10-CM | POA: Diagnosis not present

## 2021-06-25 DIAGNOSIS — I130 Hypertensive heart and chronic kidney disease with heart failure and stage 1 through stage 4 chronic kidney disease, or unspecified chronic kidney disease: Secondary | ICD-10-CM | POA: Diagnosis not present

## 2021-06-25 DIAGNOSIS — N1831 Chronic kidney disease, stage 3a: Secondary | ICD-10-CM | POA: Diagnosis not present

## 2021-06-25 DIAGNOSIS — J189 Pneumonia, unspecified organism: Secondary | ICD-10-CM | POA: Diagnosis not present

## 2021-06-25 DIAGNOSIS — J9811 Atelectasis: Secondary | ICD-10-CM | POA: Diagnosis not present

## 2021-06-25 DIAGNOSIS — I5032 Chronic diastolic (congestive) heart failure: Secondary | ICD-10-CM | POA: Diagnosis not present

## 2021-06-25 DIAGNOSIS — E860 Dehydration: Secondary | ICD-10-CM | POA: Diagnosis not present

## 2021-06-25 DIAGNOSIS — Z8249 Family history of ischemic heart disease and other diseases of the circulatory system: Secondary | ICD-10-CM | POA: Diagnosis not present

## 2021-06-25 DIAGNOSIS — L89890 Pressure ulcer of other site, unstageable: Secondary | ICD-10-CM | POA: Diagnosis not present

## 2021-06-25 DIAGNOSIS — F32A Depression, unspecified: Secondary | ICD-10-CM | POA: Diagnosis not present

## 2021-06-25 DIAGNOSIS — E8809 Other disorders of plasma-protein metabolism, not elsewhere classified: Secondary | ICD-10-CM | POA: Diagnosis not present

## 2021-06-25 DIAGNOSIS — Z8616 Personal history of COVID-19: Secondary | ICD-10-CM | POA: Diagnosis not present

## 2021-06-25 LAB — URINALYSIS, ROUTINE W REFLEX MICROSCOPIC
Bilirubin Urine: NEGATIVE
Glucose, UA: NEGATIVE mg/dL
Ketones, ur: NEGATIVE mg/dL
Nitrite: NEGATIVE
Protein, ur: NEGATIVE mg/dL
RBC / HPF: >50 RBC/hpf — ABNORMAL HIGH (ref 0–5)
Specific Gravity, Urine: 1.015 (ref 1.005–1.030)
pH: 5 (ref 5.0–8.0)

## 2021-06-25 LAB — CBC
HCT: 22.9 % — ABNORMAL LOW (ref 36.0–46.0)
Hemoglobin: 7.1 g/dL — ABNORMAL LOW (ref 12.0–15.0)
MCH: 30.6 pg (ref 26.0–34.0)
MCHC: 31 g/dL (ref 30.0–36.0)
MCV: 98.7 fL (ref 80.0–100.0)
Platelets: 45 10*3/uL — ABNORMAL LOW (ref 150–400)
RBC: 2.32 MIL/uL — ABNORMAL LOW (ref 3.87–5.11)
RDW: 22.8 % — ABNORMAL HIGH (ref 11.5–15.5)
WBC: 9.9 10*3/uL (ref 4.0–10.5)
nRBC: 0.3 % — ABNORMAL HIGH (ref 0.0–0.2)

## 2021-06-25 LAB — T4, FREE: Free T4: 0.7 ng/dL (ref 0.61–1.12)

## 2021-06-25 LAB — BASIC METABOLIC PANEL
Anion gap: 11 (ref 5–15)
BUN: 67 mg/dL — ABNORMAL HIGH (ref 8–23)
CO2: 22 mmol/L (ref 22–32)
Calcium: 7.8 mg/dL — ABNORMAL LOW (ref 8.9–10.3)
Chloride: 103 mmol/L (ref 98–111)
Creatinine, Ser: 2.43 mg/dL — ABNORMAL HIGH (ref 0.44–1.00)
GFR, Estimated: 20 mL/min — ABNORMAL LOW (ref >60)
Glucose, Bld: 100 mg/dL — ABNORMAL HIGH (ref 70–99)
Potassium: 4 mmol/L (ref 3.5–5.1)
Sodium: 136 mmol/L (ref 135–145)

## 2021-06-25 LAB — IRON AND TIBC: Iron: 57 ug/dL (ref 28–170)

## 2021-06-25 LAB — VITAMIN B12: Vitamin B-12: 4794 pg/mL — ABNORMAL HIGH (ref 180–914)

## 2021-06-25 LAB — HEMOGLOBIN AND HEMATOCRIT, BLOOD
HCT: 25.9 % — ABNORMAL LOW (ref 36.0–46.0)
Hemoglobin: 8.4 g/dL — ABNORMAL LOW (ref 12.0–15.0)

## 2021-06-25 LAB — FERRITIN: Ferritin: 408 ng/mL — ABNORMAL HIGH (ref 11–307)

## 2021-06-25 LAB — RETICULOCYTES
Immature Retic Fract: 15.2 % (ref 2.3–15.9)
RBC.: 2.31 MIL/uL — ABNORMAL LOW (ref 3.87–5.11)
Retic Count, Absolute: 30 10*3/uL (ref 19.0–186.0)
Retic Ct Pct: 1.3 % (ref 0.4–3.1)

## 2021-06-25 LAB — FOLATE: Folate: 39.8 ng/mL (ref >5.9)

## 2021-06-25 LAB — PREPARE RBC (CROSSMATCH)

## 2021-06-25 MED ORDER — BUSPIRONE HCL 5 MG PO TABS
15.0000 mg | ORAL_TABLET | Freq: Two times a day (BID) | ORAL | Status: DC
  Administered 2021-06-25 – 2021-06-27 (×5): 15 mg via ORAL
  Filled 2021-06-25 (×5): qty 1

## 2021-06-25 MED ORDER — SERTRALINE HCL 50 MG PO TABS
50.0000 mg | ORAL_TABLET | Freq: Every day | ORAL | Status: DC
  Administered 2021-06-25 – 2021-06-27 (×3): 50 mg via ORAL
  Filled 2021-06-25 (×3): qty 1

## 2021-06-25 MED ORDER — SODIUM CHLORIDE 0.9 % IV BOLUS
1000.0000 mL | Freq: Once | INTRAVENOUS | Status: AC
  Administered 2021-06-25: 1000 mL via INTRAVENOUS

## 2021-06-25 MED ORDER — LOPERAMIDE HCL 2 MG PO CAPS
2.0000 mg | ORAL_CAPSULE | ORAL | Status: DC | PRN
  Administered 2021-06-25 – 2021-06-26 (×3): 2 mg via ORAL
  Filled 2021-06-25 (×4): qty 1

## 2021-06-25 MED ORDER — GERHARDT'S BUTT CREAM
TOPICAL_CREAM | Freq: Two times a day (BID) | CUTANEOUS | Status: DC
  Administered 2021-06-25 – 2021-06-30 (×2): 1 via TOPICAL
  Filled 2021-06-25 (×2): qty 1

## 2021-06-25 MED ORDER — SODIUM CHLORIDE 0.9% IV SOLUTION: Freq: Once | INTRAVENOUS | Status: DC

## 2021-06-25 MED ORDER — SODIUM CHLORIDE 0.9 % IV BOLUS
500.0000 mL | Freq: Once | INTRAVENOUS | Status: AC
  Administered 2021-06-25: 500 mL via INTRAVENOUS

## 2021-06-25 MED ORDER — SODIUM CHLORIDE 0.9 % IV SOLN: INTRAVENOUS | Status: DC

## 2021-06-25 MED ORDER — LACTINEX PO CHEW
2.0000 | CHEWABLE_TABLET | Freq: Three times a day (TID) | ORAL | Status: DC
  Administered 2021-06-25 – 2021-07-01 (×16): 2 via ORAL
  Filled 2021-06-25 (×23): qty 2

## 2021-06-25 MED ORDER — FOLIC ACID 1 MG PO TABS
1.0000 mg | ORAL_TABLET | Freq: Every day | ORAL | Status: DC
  Administered 2021-06-25 – 2021-07-01 (×7): 1 mg via ORAL
  Filled 2021-06-25 (×7): qty 1

## 2021-06-25 MED ORDER — LEVOTHYROXINE SODIUM 112 MCG PO TABS
112.0000 ug | ORAL_TABLET | Freq: Every day | ORAL | Status: DC
  Administered 2021-06-25 – 2021-06-27 (×3): 112 ug via ORAL
  Filled 2021-06-25 (×3): qty 1

## 2021-06-25 MED ORDER — HYDROCODONE-ACETAMINOPHEN 5-325 MG PO TABS
1.0000 | ORAL_TABLET | ORAL | Status: DC | PRN
  Administered 2021-06-25 – 2021-06-27 (×7): 1 via ORAL
  Filled 2021-06-25 (×8): qty 1

## 2021-06-25 MED ORDER — CHLORHEXIDINE GLUCONATE CLOTH 2 % EX PADS
6.0000 | MEDICATED_PAD | Freq: Every day | CUTANEOUS | Status: DC
  Administered 2021-06-25 – 2021-06-29 (×5): 6 via TOPICAL

## 2021-06-25 NOTE — Evaluation (Addendum)
Occupational Therapy Evaluation Patient Details Name: Sally Wise MRN: [id number] DOB: Sep 02, 1945 Today's Date: 06/25/2021   History of Present Illness 75 year old female with PMH: paroxysmal afib, hypothyroidism, anemia/thrombocytopenia, depression/anxiety, morbid obesity, recent left ankle fracture s/p ORIF 05/07/2021 who presented to [facility] ED via EMS from her orthopedists outpatient office on 12/12 with progressive weakness and fatigue.  Since discharge from her ankle fracture, patient has been in a SNF [facility], then [facility])  functionally declining since ankle fx   Clinical Impression   Patient is a 75 year old female who was admitted for above. Patient was noted to still be NWB at this time on LLE. Patient was previously independent in ADLs with RW prior to fall with L ankle fracture. Patient has been at [facility] for rehab while awaiting WB upgrade. Currently, patient is noted to have increased edema, decreased strength, decreased functional activity tolerance, increased pain, and body habitus impacting participation in ADLs. Patient would continue to benefit from skilled OT services at this time while admitted and after d/c to address noted deficits in order to improve overall safety and independence in ADLs.        Recommendations for follow up therapy are one component of a multi-disciplinary discharge planning process, led by the attending physician.  Recommendations may be updated based on patient status, additional functional criteria and insurance authorization.   Follow Up Recommendations  Skilled nursing-short term rehab (<3 hours/day)    Assistance Recommended at Discharge Frequent or constant Supervision/Assistance  Functional Status Assessment  Patient has had a recent decline in their functional status and demonstrates the ability to make significant improvements in function in a reasonable and predictable amount of time.  Equipment Recommendations  Other (comment)  (defer to next venue)    Recommendations for Other Services       Precautions / Restrictions Precautions Precautions: Fall Required Braces or Orthoses: Other Brace Other Brace: camboot on L Restrictions Weight Bearing Restrictions: Yes LLE Weight Bearing: Non weight bearing      Mobility Bed Mobility Overal bed mobility: Needs Assistance Bed Mobility: Rolling           General bed mobility comments: unable with +1 assist    Transfers                          Balance                                           ADL either performed or assessed with clinical judgement   ADL Overall ADL's : Needs assistance/impaired Eating/Feeding: Minimal assistance;Bed level   Grooming: Wash/dry face;Oral care;Moderate assistance;Bed level   Upper Body Bathing: Maximal assistance;Bed level   Lower Body Bathing: Total assistance;Bed level   Upper Body Dressing : Maximal assistance;Bed level   Lower Body Dressing: Total assistance;Bed level   Toilet Transfer: +2 for physical assistance;+2 for safety/equipment;Total assistance   Toileting- Clothing Manipulation and Hygiene: Total assistance;+2 for physical assistance;+2 for safety/equipment       Functional mobility during ADLs: +2 for safety/equipment;+2 for physical assistance General ADL Comments: patient was positioned with pillows to support BUE and to reduce edema. patient was also positioned with pillows under BLE to offload heels and to increase comfort. patient was noted to have increased edema in BLE as well. nurse consulted. nurse aware. patient and husband were educated on  using pillows when staff rolled patient later to offload pressure on bottom. patient and husband verbalized understanding.     Vision Patient Visual Report: No change from baseline       Perception     Praxis      Pertinent Vitals/Pain Pain Assessment: Faces Faces Pain Scale: Hurts little more Pain Location:  diffuse, pain with movement Pain Descriptors / Indicators: Grimacing Pain Intervention(s): Monitored during session     Hand Dominance Right   Extremity/Trunk Assessment Upper Extremity Assessment Upper Extremity Assessment: RUE deficits/detail;LUE deficits/detail RUE Deficits / Details: patient was noted to have significant edema on UE with patient still able to complete full range even with edema levels. patient was noted to have large brusie like coloring on posterior upper arm and small needle size discoloring all over RUE as well. patient unable to tolerate shoulder MMT but grasp strength was 4/5. LUE Deficits / Details: patient was noted to have IV in this arm with edema as well slightly less than RUE. patient was able to complete full range of motion. grip strenght was 4/5. MMT to shoulder not tested. patient was able to lift both arms against gravity at bed level.   Lower Extremity Assessment Lower Extremity Assessment: Defer to PT evaluation RLE Deficits / Details: minimal active movement ankle. AA/PROM knee and hip limtied by body habitus; grossly 2/5; pt with bil hips positioned in end range hip ER, able to come to neutral hip. edematous throughout. erythema dorsum R foot LLE Deficits / Details: L ankle immobilized. AA/PROM knee and hip  limtied by body habitus; grossly 2/5. pt with bil hips positioned in end range hip ER, able to come to neutral hip; edematous throughout LLE: Unable to fully assess due to immobilization       Communication Communication Communication: No difficulties   Cognition Arousal/Alertness: Awake/alert Behavior During Therapy: WFL for tasks assessed/performed;Flat affect Overall Cognitive Status: Within Functional Limits for tasks assessed                                       General Comments       Exercises     Shoulder Instructions      Home Living Family/patient expects to be discharged to:: [place]: Cane - single point;Rolling Environmental consultant (2 wheels);Grab bars - tub/shower   Additional Comments: has pillows on chairs to raise height      Prior Functioning/Environment Prior Level of Function : Needs assist       Physical Assist : Mobility (physical);ADLs (physical) Mobility (physical): Bed mobility;Transfers;Gait ADLs (physical): Bathing;Dressing;Toileting Mobility Comments: OOB per lift per pt (lift pad present in room from facility); assist with rolling ADLs Comments: patient reported sponge bathing at home. patient reported being able to complete all ADLs herself at [facility] level at home. patient reported if she needed help her husband was present. since surgery on L ankle patient has been in rehab facility for therapy pending upgrade in WB status.        OT Problem List: Decreased activity tolerance;Impaired balance (sitting and/or standing);Decreased safety awareness;Impaired UE functional use;Decreased knowledge of precautions;Decreased knowledge of use of DME or AE;Increased edema      OT Treatment/Interventions: Self-care/ADL training;Therapeutic exercise;Neuromuscular education;Energy conservation;DME and/or  AE instruction;Therapeutic activities;Balance training;Patient/family education    OT Goals(Current goals can be found in the care plan section) Acute Rehab OT Goals Patient Stated Goal: to get back to [facility] OT Goal Formulation: With patient Time For Goal Achievement: 07/09/21 Potential to Achieve Goals: Fair  OT Frequency: Min 1X/week   Barriers to D/C:            Co-evaluation              AM-PAC OT "6 Clicks" Daily Activity     Outcome Measure Help from another person eating meals?: A Little Help from another person taking care of personal grooming?: A Little Help from another person toileting, which includes using toliet, bedpan, or urinal?: A Lot Help from another person bathing (including washing,  rinsing, drying)?: A Lot Help from another person to put on and taking off regular upper body clothing?: A Lot Help from another person to put on and taking off regular lower body clothing?: A Lot 6 Click Score: 14   End of Session Nurse Communication: Patient requests pain meds  Activity Tolerance: Patient limited by pain Patient left: in bed;with call bell/phone within reach;with family/visitor present  OT Visit Diagnosis: History of falling (Z91.81);Muscle weakness (generalized) (M62.81);Pain                Time: 1353-1413 OT Time Calculation (min): 20 min Charges:  OT General Charges $OT Visit: 1 Visit OT Evaluation $OT Eval Moderate Complexity: 1 Mod  Sharyn Blitz OTR/L, MS Acute Rehabilitation Department Office# [phone] Pager# [phone]   Ardyth Harps 06/25/2021, 3:59 PM

## 2021-06-25 NOTE — Progress Notes (Signed)
BLE venous duplex has been completed.   Results can be found under chart review under CV PROC. 06/25/2021 11:52 AM Nery Frappier RVT, RDMS

## 2021-06-25 NOTE — Plan of Care (Signed)
  Problem: Education: Goal: Knowledge of General Education information will improve Description: Including pain rating scale, medication(s)/side effects and non-pharmacologic comfort measures 06/25/2021 0315 by Virgilio Belling, RN Outcome: Progressing 06/25/2021 0313 by Virgilio Belling, RN Outcome: Progressing 06/25/2021 0313 by Virgilio Belling, RN Outcome: Progressing   Problem: Coping: Goal: Level of anxiety will decrease Outcome: Progressing   Problem: Pain Managment: Goal: General experience of comfort will improve 06/25/2021 0315 by Virgilio Belling, RN Outcome: Progressing 06/25/2021 0313 by Virgilio Belling, RN Outcome: Progressing 06/25/2021 0313 by Virgilio Belling, RN Outcome: Progressing   Problem: Safety: Goal: Ability to remain free from injury will improve 06/25/2021 0315 by Virgilio Belling, RN Outcome: Progressing 06/25/2021 0313 by Virgilio Belling, RN Outcome: Progressing

## 2021-06-25 NOTE — ED Notes (Signed)
Pt rolled, foam dressing on buttocks but nothing else found on assessment that would be causing pain. Pt cannot localize where her pain is, if it is internal or external.

## 2021-06-25 NOTE — Evaluation (Signed)
Physical Therapy Evaluation Patient Details Name: Sally Wise MRN: [id number] DOB: 04-Jul-1946 Today's Date: 06/25/2021  History of Present Illness  75 year old female with PMH: paroxysmal afib, hypothyroidism, anemia/thrombocytopenia, depression/anxiety, morbid obesity, recent left ankle fracture s/p ORIF 05/07/2021 who presented to [facility] ED via EMS from her orthopedists outpatient office on 12/12 with progressive weakness and fatigue.  Since discharge from her ankle fracture, patient has been in a SNF [facility], then [place])  functionally declining since ankle fx  Clinical Impression  Pt admitted with above diagnosis.  Pt reports she has been working with PT at [facility]. She desires to continue rehab there and is hopeful to be able amb again. She is limited by pain, weakness and body habitus. Continue to follow in acute setting. Plan is to return to SNF.   Pt currently with functional limitations due to the deficits listed below (see PT Problem List). Pt will benefit from skilled PT to increase their independence and safety with mobility to allow discharge to the venue listed below.          Recommendations for follow up therapy are one component of a multi-disciplinary discharge planning process, led by the attending physician.  Recommendations may be updated based on patient status, additional functional criteria and insurance authorization.  Follow Up Recommendations Skilled nursing-short term rehab (<3 hours/day) (return to SNF)    Assistance Recommended at Discharge Frequent or constant Supervision/Assistance  Functional Status Assessment    Equipment Recommendations  None recommended by PT    Recommendations for Other Services       Precautions / Restrictions Precautions Precautions: Fall Required Braces or Orthoses: Other Brace Other Brace: camboot on L Restrictions Weight Bearing Restrictions: Yes LLE Weight Bearing: Non weight bearing      Mobility  Bed  Mobility Overal bed mobility: Needs Assistance Bed Mobility: Rolling           General bed mobility comments: unable with +1 assist    Transfers                        Ambulation/Gait                  Stairs            Wheelchair Mobility    Modified Rankin (Stroke Patients Only)       Balance                                             Pertinent Vitals/Pain Pain Assessment: Faces Faces Pain Scale: Hurts little more Pain Location: diffuse, pain with movement Pain Descriptors / Indicators: Grimacing Pain Intervention(s): Monitored during session    Home Living Family/patient expects to be discharged to:: Skilled nursing facility                 Home Equipment: Gilmer Mor - single Librarian, academic (2 wheels);Grab bars - tub/shower      Prior Function Prior Level of Function : Needs assist       Physical Assist : Mobility (physical);ADLs (physical)     Mobility Comments: OOB per lift per pt (lift pad present in room from facility); assist with rolling       Hand Dominance        Extremity/Trunk Assessment   Upper Extremity Assessment Upper Extremity Assessment: Defer to OT evaluation  Lower Extremity Assessment Lower Extremity Assessment: RLE deficits/detail;LLE deficits/detail RLE Deficits / Details: minimal active movement ankle. AA/PROM knee and hip limtied by body habitus; grossly 2/5; pt with bil hips positioned in end range hip ER, able to come to neutral hip. edematous throughout. erythema dorsum R foot LLE Deficits / Details: L ankle immobilized. AA/PROM knee and hip  limtied by body habitus; grossly 2/5. pt with bil hips positioned in end range hip ER, able to come to neutral hip; edematous throughout LLE: Unable to fully assess due to immobilization       Communication      Cognition Arousal/Alertness: Awake/alert Behavior During Therapy: WFL for tasks assessed/performed;Flat  affect Overall Cognitive Status: Within Functional Limits for tasks assessed                                          General Comments      Exercises     Assessment/Plan    PT Assessment Patient needs continued PT services  PT Problem List Decreased strength;Decreased mobility;Decreased range of motion;Obesity;Decreased activity tolerance;Pain       PT Treatment Interventions DME instruction;Therapeutic activities;Functional mobility training;Therapeutic exercise;Patient/family education    PT Goals (Current goals can be found in the Care Plan section)  Acute Rehab PT Goals Patient Stated Goal: to be able to move, walk again PT Goal Formulation: With patient Time For Goal Achievement: 07/09/21 Potential to Achieve Goals: Poor    Frequency Min 2X/week   Barriers to discharge        Co-evaluation               AM-PAC PT "6 Clicks" Mobility  Outcome Measure Help needed turning from your back to your side while in a flat bed without using bedrails?: Total Help needed moving from lying on your back to sitting on the side of a flat bed without using bedrails?: Total Help needed moving to and from a bed to a chair (including a wheelchair)?: Total Help needed standing up from a chair using your arms (e.g., wheelchair or bedside chair)?: Total Help needed to walk in hospital room?: Total Help needed climbing 3-5 steps with a railing? : Total 6 Click Score: 6    End of Session   Activity Tolerance: Patient limited by fatigue Patient left: in bed;with call bell/phone within reach;with bed alarm set   PT Visit Diagnosis: Muscle weakness (generalized) (M62.81);Other abnormalities of gait and mobility (R26.89)    Time: [id number] PT Time Calculation (min) (ACUTE ONLY): 17 min   Charges:   PT Evaluation $PT Eval Low Complexity: [place], PT  Acute Rehab Dept ([place]) [phone] Pager  [phone]  06/25/2021   [facility] 06/25/2021, 3:37 PM

## 2021-06-25 NOTE — ED Notes (Signed)
Pt c/o rectal/"bottom" pain. Pt requesting someone look at it. Pt informed this RN will get female help to look at it.

## 2021-06-25 NOTE — Progress Notes (Addendum)
PROGRESS NOTE    Sally Wise  [id number] DOB: [birth year] DOA: 06/24/2021 PCP: [name], MD    Brief Narrative:  Sally Wise is a 75 year old female with past medical history significant for paroxysmal atrial fibrillation, hypothyroidism, anemia/thrombocytopenia, depression/anxiety, morbid obesity, recent left ankle fracture s/p ORIF 05/07/2021 who presented to [facility] ED via EMS from her orthopedists outpatient office on 12/12 with progressive weakness and fatigue.  Since discharge from her ankle fracture, patient has been in a SNF and states has been functionally declining and feeling generally weak.  On outpatient evaluation by her orthopedic team, she was noted to be pale and fatigued and sent to the ED for further evaluation.  Patient reports she has yet to be able to walk with therapy and uses a wheelchair at her nursing facility.  Patient reports nausea without emesis.  Also with chronic issues with loose stools which is unchanged from baseline.  Also with poor oral intake, states has lost her taste for food.  Denies dysuria.  In the ED, Temperature 97.3 F, HR 81, RR 12, BP 85/52, SPO2 100% on room air.  Sodium 136, potassium 4.0, chloride 103, CO2 22, glucose 100, BUN 67, creatinine 2.43 (baseline 1.0-1.1).  WBC 9.9, hemoglobin 7.1, platelets 45.  MCV 98.7.  AST 13, ALT 18, alkaline phosphatase 273, total bilirubin 0.9, troponin 8>8.  Magnesium 2.3.  Patient was given 1 L NS bolus with improvement of blood pressure.  Hospitalist service consulted for further evaluation and management of acute renal failure on CKD stage IIIa.   Assessment & Plan:   Principal Problem:   Acute kidney injury superimposed on chronic kidney disease ([place]) Active Problems:   Hypothyroidism   PAF (paroxysmal atrial fibrillation) (HCC)   Thrombocytopenia (HCC)   Anemia   Hypotension   Pressure injury of skin   Acute renal failure on CKD stage IIIa: Creatinine 2.44 on admission with a  baseline of 1.0-1.1.  Etiology likely secondary to prerenal azotemia in the setting of dehydration from poor oral intake versus ATN from hypotension.  Renal ultrasound with nonvisualized left kidney due to bowel gas overlying and body habitus, unremarkable right kidney. --Urinalysis, urine sodium, urine creatinine: Pending --Continue IVF with NS at 75 MLS per hour --Avoid nephrotoxins, renal dose all medications --BMP in a.m.  Hypotension Hx Essential HTN/paroxysmal atrial fibrillation BP was noted to be 85/52 on admission, likely secondary to dehydration.  Patient is on diltiazem 180 mg p.o. daily and furosemide 20 mg p.o. daily at home.  Received IV fluid bolus with improvement of blood pressure in the ED, continues on IV fluid hydration. --Continue to hold home diltiazem --Continue monitor blood pressure closely --Monitor on telemetry  Symptomatic anemia/Hx folate deficiency Hemoglobin initially on presentation 8.3, dropped to 7.1 this morning following IV fluid hydration.  Suspect hemoconcentration from dehydration.  Anemia panel with iron 57, ferritin 408, B12 4000 794, folate 39.8.   --Transfuse 1 unit PRBC today, H&H following transfusion --CBC daily  Thrombocytopenia Follows with medical hematology outpatient, Dr. Alen Blew.  Unclear etiology.  No reports of active bleeding, denies dark stools.  Platelet count 45, stable. --CBC daily  Depression/anxiety --BuSpar 50 mg p.o. twice daily --Sertraline 50 mg p.o. daily  Bilateral lower extremity edema Patient reports recently started on furosemide.  Has been immobile for several weeks following ankle fracture. --Check vascular duplex ultrasound bilateral lower extremities  Hypothyroidism TSH elevated 9.202. >>> ?  Contributing to weakness/fatigue --Check free T4 --Continue levothyroxine 112 mcg p.o. daily  Weakness/deconditioning/debility: Recent left ankle fracture Underwent ORIF by Dr. Victorino Dike on 05/07/2021.  Was to have repeat  x-rays on 06/24/2021 outpatient but was deferred and transported to the ER as above.  Discussed with orthopedics on 06/25/2021, no current changes and to remain nonweightbearing with boot in place and they will follow-up with the patient after discharge. --Continue nonweightbearing left lower extremity with boot in place --PT/OT evaluation   DVT prophylaxis: SCDs Start: 06/24/21 2238   Code Status: Full Code Family Communication: Updated patient's husband present at bedside  Disposition Plan:  Level of care: Med-Surg Status is: Observation  The patient remains OBS appropriate and will d/c before 2 midnights.   Consultants:  [facility]; discussed case and they will follow-up with the patient following discharge  Procedures:  Renal ultrasound Vascular duplex ultrasound bilateral lower extremities: Pending  Antimicrobials:  None   Subjective: Patient seen examined at bedside, resting comfortably.  Multiple complaints this morning.  Husband also present at bedside.  When asked specific questions, she answers with "I do not know", "no one tells me", "it is all in my records".  Husband concerned about her current gradual decline, weakness and fatigue since her ankle fracture back in October.  Patient has been nonweightbearing to her left lower extremity; although has been ultimately utilizing a wheelchair at SNF.  When asked about what medication she is takes, she cannot tell me specifically and states; "they are in on the paperwork".  No other specific questions or concerns at this time.  Denies headache, no chest pain, no shortness of breath, no abdominal pain.  No acute events overnight per nursing staff.  Objective: Vitals:   06/25/21 0015 06/25/21 0050 06/25/21 0135 06/25/21 0546  BP: 99/61 (!) 83/53 (!) 86/54 (!) 107/49  Pulse: 65 66 72 63  Resp: 16 18 18 18   Temp:  97.9 F (36.6 C) 97.9 F (36.6 C) 98.1 F (36.7 C)  TempSrc:  Oral Oral Oral  SpO2: 100% 98% 98% 93%  Weight:       Height:        Intake/Output Summary (Last 24 hours) at 06/25/2021 1036 Last data filed at 06/25/2021 0326 Gross per 24 hour  Intake 896.69 ml  Output --  Net 896.69 ml   Filed Weights   06/24/21 1651  Weight: (!) 138.3 kg    Examination:  General exam: Appears calm and comfortable, morbidly obese; chronically ill in appearance Respiratory system: Clear to auscultation. Respiratory effort normal.  On room air Cardiovascular system: S1 & S2 heard, RRR. No JVD, murmurs, rubs, gallops or clicks.  2+-3+ pedal edema bilateral lower extremities to mid shin Gastrointestinal system: Abdomen is nondistended, soft and nontender. No organomegaly or masses felt. Normal bowel sounds heard. Central nervous system: Alert and oriented. No focal neurological deficits. Extremities: Independently, boot noted to left foot/ankle; neurovascular intact. Skin: No rashes, lesions or ulcers Psychiatry: Judgement and insight appear poor. Mood & affect appropriate.     Data Reviewed: I have personally reviewed following labs and imaging studies  CBC: Recent Labs  Lab 06/24/21 1807 06/25/21 0331  WBC 9.3 9.9  NEUTROABS 6.1  --   HGB 8.3* 7.1*  HCT 26.6* 22.9*  MCV 98.2 98.7  PLT 56* 45*   Basic Metabolic Panel: Recent Labs  Lab 06/24/21 1807 06/25/21 0331  NA 134* 136  K 4.3 4.0  CL 101 103  CO2 22 22  GLUCOSE 118* 100*  BUN 62* 67*  CREATININE 2.44* 2.43*  CALCIUM 8.0* 7.8*  MG 2.3  --    GFR: Estimated Creatinine Clearance: 28.7 mL/min (A) (by C-G formula based on SCr of 2.43 mg/dL (H)). Liver Function Tests: Recent Labs  Lab 06/24/21 1807  AST 13*  ALT 18  ALKPHOS 273*  BILITOT 0.9  PROT 5.6*  ALBUMIN 2.4*   No results for input(s): LIPASE, AMYLASE in the last 168 hours. No results for input(s): AMMONIA in the last 168 hours. Coagulation Profile: No results for input(s): INR, PROTIME in the last 168 hours. Cardiac Enzymes: No results for input(s): CKTOTAL, CKMB,  CKMBINDEX, TROPONINI in the last 168 hours. BNP (last 3 results) No results for input(s): PROBNP in the last 8760 hours. HbA1C: No results for input(s): HGBA1C in the last 72 hours. CBG: Recent Labs  Lab 06/24/21 1655  GLUCAP 98   Lipid Profile: No results for input(s): CHOL, HDL, LDLCALC, TRIG, CHOLHDL, LDLDIRECT in the last 72 hours. Thyroid Function Tests: Recent Labs    06/24/21 2225  TSH 9.202*   Anemia Panel: Recent Labs    06/24/21 2225 06/25/21 0326  VITAMINB12 4,794*  --   FOLATE 39.8  --   FERRITIN 408*  --   TIBC NOT CALCULATED  --   IRON 57  --   RETICCTPCT  --  1.3   Sepsis Labs: No results for input(s): PROCALCITON, LATICACIDVEN in the last 168 hours.  No results found for this or any previous visit (from the past 240 hour(s)).       Radiology Studies: US RENAL  Result Date: 06/24/2021 CLINICAL DATA:  Acute on chronic renal insufficiency, renal calculus EXAM: RENAL / URINARY TRACT ULTRASOUND COMPLETE COMPARISON:  05/01/2021 FINDINGS: Right Kidney: Renal measurements: 10.1 x 4.5 x 5.6 cm = volume: 134 mL. Echogenicity within normal limits. No mass or hydronephrosis visualized. Left Kidney: Not visualized due to patient body habitus and bowel gas. Bladder: Decompressed, limiting evaluation. Other: None. IMPRESSION: 1. Nonvisualization of the left kidney due to bowel gas and patient body habitus. 2. Unremarkable right kidney. Electronically Signed   By: Randa Ngo M.D.   On: 06/24/2021 22:43        Scheduled Meds:  sodium chloride   Intravenous Once   busPIRone  15 mg Oral BID   folic acid  1 mg Oral Daily   levothyroxine  112 mcg Oral Q0600   sertraline  50 mg Oral Daily   Continuous Infusions:   LOS: 0 days    Time spent: 46 minutes spent on chart review, discussion with nursing staff, consultants, updating family and interview/physical exam; more than 50% of that time was spent in counseling and/or coordination of care.    Sally Wise  [place] DO [facility] Available via Epic secure chat 7am-7pm After these hours, please refer to coverage provider listed on [email or web address] 06/25/2021, 10:36 AM

## 2021-06-25 NOTE — Progress Notes (Signed)
Dr [place] notified of pts vital signs and that she has not had any urine today.  Bolus ordered.

## 2021-06-25 NOTE — Progress Notes (Signed)
In and out cath done to get urine for the urine labs that are ordered.  Only got 2cc of urine.  Lab said this is enough for the culture but not the other labs.  Dr [place] notified.

## 2021-06-26 ENCOUNTER — Inpatient Hospital Stay (HOSPITAL_COMMUNITY): Payer: Medicare Other

## 2021-06-26 DIAGNOSIS — K5730 Diverticulosis of large intestine without perforation or abscess without bleeding: Secondary | ICD-10-CM | POA: Diagnosis not present

## 2021-06-26 DIAGNOSIS — R531 Weakness: Secondary | ICD-10-CM | POA: Diagnosis not present

## 2021-06-26 DIAGNOSIS — D259 Leiomyoma of uterus, unspecified: Secondary | ICD-10-CM | POA: Diagnosis not present

## 2021-06-26 DIAGNOSIS — K8020 Calculus of gallbladder without cholecystitis without obstruction: Secondary | ICD-10-CM | POA: Diagnosis not present

## 2021-06-26 DIAGNOSIS — N179 Acute kidney failure, unspecified: Secondary | ICD-10-CM | POA: Diagnosis not present

## 2021-06-26 DIAGNOSIS — M7732 Calcaneal spur, left foot: Secondary | ICD-10-CM | POA: Diagnosis not present

## 2021-06-26 DIAGNOSIS — N189 Chronic kidney disease, unspecified: Secondary | ICD-10-CM | POA: Diagnosis not present

## 2021-06-26 LAB — CBC
HCT: 21.2 % — ABNORMAL LOW (ref 36.0–46.0)
Hemoglobin: 6.9 g/dL — CL (ref 12.0–15.0)
MCH: 30.7 pg (ref 26.0–34.0)
MCHC: 32.5 g/dL (ref 30.0–36.0)
MCV: 94.2 fL (ref 80.0–100.0)
Platelets: 35 10*3/uL — ABNORMAL LOW (ref 150–400)
RBC: 2.25 MIL/uL — ABNORMAL LOW (ref 3.87–5.11)
RDW: 21.6 % — ABNORMAL HIGH (ref 11.5–15.5)
WBC: 6.6 10*3/uL (ref 4.0–10.5)
nRBC: 0.3 % — ABNORMAL HIGH (ref 0.0–0.2)

## 2021-06-26 LAB — BASIC METABOLIC PANEL
Anion gap: 8 (ref 5–15)
BUN: 66 mg/dL — ABNORMAL HIGH (ref 8–23)
CO2: 22 mmol/L (ref 22–32)
Calcium: 7.7 mg/dL — ABNORMAL LOW (ref 8.9–10.3)
Chloride: 105 mmol/L (ref 98–111)
Creatinine, Ser: 2.29 mg/dL — ABNORMAL HIGH (ref 0.44–1.00)
GFR, Estimated: 21 mL/min — ABNORMAL LOW (ref >60)
Glucose, Bld: 82 mg/dL (ref 70–99)
Potassium: 3.8 mmol/L (ref 3.5–5.1)
Sodium: 135 mmol/L (ref 135–145)

## 2021-06-26 LAB — MAGNESIUM: Magnesium: 2 mg/dL (ref 1.7–2.4)

## 2021-06-26 LAB — URINE CULTURE: Culture: NO GROWTH

## 2021-06-26 LAB — VITAMIN D 25 HYDROXY (VIT D DEFICIENCY, FRACTURES): Vit D, 25-Hydroxy: 99.56 ng/mL (ref 30–100)

## 2021-06-26 LAB — HEMOGLOBIN AND HEMATOCRIT, BLOOD
HCT: 28.7 % — ABNORMAL LOW (ref 36.0–46.0)
Hemoglobin: 9.4 g/dL — ABNORMAL LOW (ref 12.0–15.0)

## 2021-06-26 LAB — PREPARE RBC (CROSSMATCH)

## 2021-06-26 LAB — LIPASE, BLOOD: Lipase: 27 U/L (ref 11–51)

## 2021-06-26 MED ORDER — SODIUM CHLORIDE 0.9 % IV SOLN
100.0000 mg | Freq: Two times a day (BID) | INTRAVENOUS | Status: DC
  Administered 2021-06-26 – 2021-06-27 (×2): 100 mg via INTRAVENOUS
  Filled 2021-06-26 (×5): qty 100

## 2021-06-26 MED ORDER — SODIUM CHLORIDE 0.9% IV SOLUTION: Freq: Once | INTRAVENOUS | Status: AC

## 2021-06-26 MED ORDER — ALBUMIN HUMAN 25 % IV SOLN
12.5000 g | Freq: Once | INTRAVENOUS | Status: AC
  Administered 2021-06-26: 14:00:00 12.5 g via INTRAVENOUS
  Filled 2021-06-26: qty 50

## 2021-06-26 MED ORDER — VITAMIN D 25 MCG (1000 UNIT) PO TABS
1000.0000 [IU] | ORAL_TABLET | Freq: Every day | ORAL | Status: DC
Start: 2021-06-26 — End: 2021-07-02
  Administered 2021-06-26 – 2021-07-01 (×6): 1000 [IU] via ORAL
  Filled 2021-06-26 (×6): qty 1

## 2021-06-26 MED ORDER — MELATONIN 3 MG PO TABS
3.0000 mg | ORAL_TABLET | Freq: Every day | ORAL | Status: DC
  Administered 2021-06-26: 21:00:00 3 mg via ORAL
  Filled 2021-06-26: qty 1

## 2021-06-26 MED ORDER — SODIUM CHLORIDE 0.9 % IV SOLN
2.0000 g | INTRAVENOUS | Status: DC
  Administered 2021-06-26: 17:00:00 2 g via INTRAVENOUS
  Filled 2021-06-26 (×2): qty 20

## 2021-06-26 NOTE — Consult Note (Signed)
WOC Nurse Consult Note: Patient receiving care in [facility]. Spouse in room at time of my visit. Assisted with turning, clean bowel incontinence by two NTs. Reason for Consult: wounds to ankle and sacrum Wound type: Dried black scab to left medial malleolus at the site of an incision scar. Two areas of DTPI to coccyx separated by intact skin. Pressure Injury POA: Yes Measurement: Scab measures 2 cm x 0.8 cm. For this wound--twice daily application of iodine and allow to air dry.  For the coccyx DTPIs--A foam dressing; lift each shift to view the DTPIs. Wound bed: as described Drainage (amount, consistency, odor) none from either Periwound: LLE with scattered bruising. Coccyx, intact Dressing procedure/placement/frequency: As above.  I have also ordered a bariatric bed with air mattress.  Monitor the wound area(s) for worsening of condition such as: Signs/symptoms of infection,  Increase in size,  Development of or worsening of odor, Development of pain, or increased pain at the affected locations.  Notify the medical team if any of these develop.  Thank you for the consult.  Discussed plan of care with the patient and bedside nurse.  WOC nurse will not follow at this time.  Please re-consult the WOC team if needed.  Helmut Muster, RN, MSN, CWOCN, CNS-BC, pager [phone]

## 2021-06-26 NOTE — Progress Notes (Signed)
Patient transferred from 3rd floor to 8F~1886. Transferred to bariatric bed/alternating R/L to prevent further skin break down.  Foley care performed. Cardiac monitoring setup. BLE elevated. All questions & concerns addressed. IV access patent & IV abx running.  Call bell within reach & phone. Denies pain.  Clothes, L Cam boot & glasses with patient upon transfer.  Pt A/Ox4  RA VS below:  06/26/21 1848  Vitals  Temp 97.8 F (36.6 C)  Temp Source Oral  BP (!) 81/45  MAP (mmHg) (!) 56  BP Location Left Arm  BP Method Automatic  Patient Position (if appropriate) Lying  Pulse Rate 75  Resp 20  MEWS COLOR  MEWS Score Color Green  Oxygen Therapy  SpO2 93 %  O2 Device Room Air  MEWS Score  MEWS Temp 0  MEWS Systolic 1  MEWS Pulse 0  MEWS RR 0  MEWS LOC 0  MEWS Score 1

## 2021-06-26 NOTE — Progress Notes (Signed)
PROGRESS NOTE    Sally Wise  [id number] DOB: 01/04/1946 DOA: 06/24/2021 PCP: [name], MD    Brief Narrative:  Sally Wise is a 75 year old female with past medical history significant for paroxysmal atrial fibrillation, hypothyroidism, anemia/thrombocytopenia, depression/anxiety, morbid obesity, recent left ankle fracture s/p ORIF 05/07/2021 who presented to [facility] ED via EMS from her orthopedists outpatient office on 12/12 with progressive weakness and fatigue.  Since discharge from her ankle fracture, patient has been in a SNF and states has been functionally declining and feeling generally weak.  On outpatient evaluation by her orthopedic team, she was noted to be pale and fatigued and sent to the ED for further evaluation.  Patient reports she has yet to be able to walk with therapy and uses a wheelchair at her nursing facility.    Assessment & Plan:   Principal Problem:   Acute kidney injury superimposed on chronic kidney disease ([place]) Active Problems:   Hypothyroidism   PAF (paroxysmal atrial fibrillation) (HCC)   Thrombocytopenia (HCC)   Anemia   Hypotension   Pressure injury of skin   Acute renal failure superimposed on chronic kidney disease (HCC)   Acute renal failure on CKD stage IIIa: Creatinine 2.44 on admission with a baseline of 1.0-1.1.  Etiology likely secondary to prerenal azotemia in the setting of dehydration from poor oral intake versus ATN from hypotension.  Renal ultrasound with nonvisualized left kidney due to bowel gas overlying and body habitus, unremarkable right kidney. --Urinalysis: RBCs; urine sodium, urine creatinine: Pending --Continue IVF with NS at 75 MLS per hour --Avoid nephrotoxins, renal dose all medications  Small layering pleural effusions with adjacent left-greater-than-right lower lobe opacities consistent with atelectasis or pneumonia. -incentive spirometry -add abx for now- rocephin and doxy  Increased stranding  at the pancreatic head and uncinate process concerning for pancreatitis -lipase negative  Hypotension Hx Essential HTN/paroxysmal atrial fibrillation BP was noted to be 85/52 on admission, likely secondary to dehydration and medications.   Patient is on diltiazem 180 mg p.o. daily and furosemide 20 mg p.o. daily at home.  Received IV fluid bolus with improvement of blood pressure in the ED, continues on IV fluid hydration. --Continue to hold home diltiazem --trial of IV albumin as she has some 3rd spacing from IVF  Symptomatic anemia/Hx folate deficiency S/p 2 unit PRBC (not sure how accurate this AM's labs were) -check stools for blood Iron ok, B12 elevated  Thrombocytopenia Follows with medical hematology outpatient, Dr. Alen Blew.  Unclear etiology.  No reports of active bleeding, denies dark stools.  Platelet count trending down --CBC daily  Depression/anxiety --BuSpar 50 mg p.o. twice daily --Sertraline 50 mg p.o. daily  Bilateral lower extremity edema Patient reports recently started on furosemide.  Has been immobile for several weeks following ankle fracture. --low albumin -nutrition consult -duplex negative for DVT  Hypothyroidism TSH elevated 9.202. >>> ?  Contributing to weakness/fatigue --T4 normal --Continue levothyroxine 112 mcg p.o. daily  Corrected calcium: 9  Weakness/deconditioning/debility: Recent left ankle fracture Underwent ORIF by Dr. Doran Durand on 05/07/2021.  Was to have repeat x-rays on 06/24/2021 outpatient but was deferred and transported to the ER as above.   -repeat x ray done, seen by ortho: her films today show appropriate healing now 6 weeks post op.  She's in a cam boot which can be removed for ROM in PT.  However, her size and poor bone quality preclude advancing her to full WB in the boot.  She can bear weight to  a max of 75# in the boot for the next month. --PT/OT evaluation   Pressure Injury 06/25/21 Coccyx Mid;Lower Stage 3 -  Full thickness  tissue loss. Subcutaneous fat may be visible but bone, tendon or muscle are NOT exposed. 1.5 cm by 1 cm (Active)  06/25/21 0130  Location: Coccyx  Location Orientation: Mid;Lower  Staging: Stage 3 -  Full thickness tissue loss. Subcutaneous fat may be visible but bone, tendon or muscle are NOT exposed.  Wound Description (Comments): 1.5 cm by 1 cm  Present on Admission: Yes     Pressure Injury Pretibial Distal;Left Unstageable - Full thickness tissue loss in which the base of the injury is covered by slough (yellow, tan, gray, green or brown) and/or eschar (tan, brown or black) in the wound bed. 2 cm x 1 cm (Active)     Location: Pretibial  Location Orientation: Distal;Left  Staging: Unstageable - Full thickness tissue loss in which the base of the injury is covered by slough (yellow, tan, gray, green or brown) and/or eschar (tan, brown or black) in the wound bed.  Wound Description (Comments): 2 cm x 1 cm  Present on Admission: Yes    As she has multiple medical issues and has FTT, will needs [place] conversation either by [facility] or palliative care in the near future- returned to room on 12/14 but husband had stepped out     DVT prophylaxis: SCDs Start: 06/24/21 2238   Code Status: Full Code Family Communication: Updated patient's husband present at bedside  Disposition Plan:  Tx to PCU for closer monitoring     Consultants:  [facility]  Procedures:  Renal ultrasound Vascular duplex ultrasound bilateral lower extremities: Pending    Subjective: Husband remains concerned about her current gradual decline, weakness and fatigue since her ankle fracture back in October.  Patient has been nonweightbearing to her left lower extremity; although has been ultimately utilizing a wheelchair at SNF.   -no SOB, no CP   Objective: Vitals:   06/26/21 0506 06/26/21 0507 06/26/21 0716 06/26/21 1336  BP: (!) 91/42 (!) 91/42 (!) 109/49 (!) 98/46  Pulse: (!) 58 (!) 58 63 65  Resp: 16 16 18  18   Temp: (!) 97.5 F (36.4 C) (!) 97.5 F (36.4 C) 97.6 F (36.4 C) 97.6 F (36.4 C)  TempSrc:  Oral Oral Oral  SpO2: 99% 99%  92%  Weight:      Height:        Intake/Output Summary (Last 24 hours) at 06/26/2021 1457 Last data filed at 06/26/2021 1325 Gross per 24 hour  Intake 2664.07 ml  Output 670 ml  Net 1994.07 ml   Filed Weights   06/24/21 1651 06/26/21 0459  Weight: (!) 138.3 kg (!) 143.5 kg    Examination:   General: Appearance:    Severely obese female in no acute distress     Lungs:      respirations unlabored  Heart:    Normal heart rate.   MS:   All extremities are intact.    Neurologic:   Awake, alert, oriented x 3.      Data Reviewed: I have personally reviewed following labs and imaging studies  CBC: Recent Labs  Lab 06/24/21 1807 06/25/21 0331 06/25/21 1929 06/26/21 0325 06/26/21 1017  WBC 9.3 9.9  --  6.6  --   NEUTROABS 6.1  --   --   --   --   HGB 8.3* 7.1* 8.4* 6.9* 9.4*  HCT 26.6* 22.9* 25.9* 21.2*  28.7*  MCV 98.2 98.7  --  94.2  --   PLT 56* 45*  --  35*  --    Basic Metabolic Panel: Recent Labs  Lab 06/24/21 1807 06/25/21 0331 06/26/21 0325  NA 134* 136 135  K 4.3 4.0 3.8  CL 101 103 105  CO2 22 22 22   GLUCOSE 118* 100* 82  BUN 62* 67* 66*  CREATININE 2.44* 2.43* 2.29*  CALCIUM 8.0* 7.8* 7.7*  MG 2.3  --  2.0   GFR: Estimated Creatinine Clearance: 31.1 mL/min (A) (by C-G formula based on SCr of 2.29 mg/dL (H)). Liver Function Tests: Recent Labs  Lab 06/24/21 1807  AST 13*  ALT 18  ALKPHOS 273*  BILITOT 0.9  PROT 5.6*  ALBUMIN 2.4*   Recent Labs  Lab 06/26/21 1222  LIPASE 27   No results for input(s): AMMONIA in the last 168 hours. Coagulation Profile: No results for input(s): INR, PROTIME in the last 168 hours. Cardiac Enzymes: No results for input(s): CKTOTAL, CKMB, CKMBINDEX, TROPONINI in the last 168 hours. BNP (last 3 results) No results for input(s): PROBNP in the last 8760 hours. HbA1C: No  results for input(s): HGBA1C in the last 72 hours. CBG: Recent Labs  Lab 06/24/21 1655  GLUCAP 98   Lipid Profile: No results for input(s): CHOL, HDL, LDLCALC, TRIG, CHOLHDL, LDLDIRECT in the last 72 hours. Thyroid Function Tests: Recent Labs    06/24/21 2225  TSH 9.202*  FREET4 0.70   Anemia Panel: Recent Labs    06/24/21 2225 06/25/21 0326  VITAMINB12 4,794*  --   FOLATE 39.8  --   FERRITIN 408*  --   TIBC NOT CALCULATED  --   IRON 57  --   RETICCTPCT  --  1.3   Sepsis Labs: No results for input(s): PROCALCITON, LATICACIDVEN in the last 168 hours.  Recent Results (from the past 240 hour(s))  Urine Culture     Status: None   Collection Time: 06/25/21  9:51 AM   Specimen: Urine, Catheterized  Result Value Ref Range Status   Specimen Description   Final    URINE, CATHETERIZED Performed at [facility] [address] [place], [place] [address]    Special Requests   Final    NONE Performed at [facility], [place] [address] [place], [place] [address]    Culture   Final    NO GROWTH Performed at [facility], [place] [address] [place],  [address]    Report Status 06/26/2021 FINAL  Final         Radiology Studies: CT ABDOMEN PELVIS WO CONTRAST  Result Date: 06/26/2021 CLINICAL DATA:  Increasing weakness with decreasing urine output. EXAM: CT ABDOMEN AND PELVIS WITHOUT CONTRAST TECHNIQUE: Multidetector CT imaging of the abdomen and pelvis was performed following the standard protocol without IV contrast. COMPARISON:  Chest CT no contrast 04/23/2020, abdomen and pelvis CT with IV contrast 03/15/2021. FINDINGS: Lower chest: Mild cardiomegaly with coronary artery calcifications is again seen. There is development of small bilateral layering pleural effusions, with adjacent opacity in the left greater than right lower lobes which could be atelectasis or pneumonia. There is an 8 mm stable nodule in the posteromedial  aspect of the right middle lobe, unchanged since 04/23/2020. Hepatobiliary: 20 cm length mildly steatotic liver. Small subcentimeter scattered hypodensities which are too small to characterize but seem unchanged. These were better visualized on the contrasted exam. Some images may suggest early cirrhotic liver configuration.  There are multiple stones in the gallbladder and a distended gallbladder, without wall thickening or increased biliary dilatation with common bile duct stable in prominence at 8.6 mm. Pancreas: Fatty atrophic with increased stranding at the pancreatic head and uncinate process concerning for acute pancreatitis. There is no peripancreatic fluid, no mass is seen or ductal dilatation. Spleen: Slightly prominent 12.9 cm coronal and otherwise unremarkable without contrast. A faint 1.6 cm hypodensity was visible on the contrast exam but is not able to be seen without contrast. Adrenals/Urinary Tract: There is no adrenal mass. No focal abnormality in the unenhanced renal cortex. There are multiple scattered bilateral punctate nonobstructive caliceal stones. There is no hydronephrosis or ureteral stone. The bladder is contracted around a Foley balloon and not well seen. Stomach/Bowel: No dilatation or wall thickening including the appendix is seen through the sigmoid colon. Diffuse diverticulosis greatest along the left colon is seen , no findings of acute diverticulitis. There is increased circumferential rectal thickening probably due to proctitis, less likely infiltrating disease given the short interval time frame of its appearance. There is increased perirectal stranding. Vascular/Lymphatic: Aortic atherosclerosis. Mildly enlarged periportal lymph nodes are again noted up to 1.4 cm in short axis, unchanged from 3 months ago, with slight increased prominence of hypogastric lymph nodes today and no further adenopathy. Reproductive: A calcified uterine fibroid is again noted of the right-sided fundus.  3.5 x 2.5 cm right ovarian cystic lesion with calcification is stable since a remote previous CT of 11/08/2007, presumably inconsequential. Other: There is increasing presacral edema possibly related to proctitis or possibly congestive. There is increased body wall edema in the flanks, likely either congestive or from fluid overload and small amount of posterior deep pelvic and pericolic gutter ascites. There is no free air, free hemorrhage or abscess. Musculoskeletal: There is osteopenia , levorotary lumbar scoliosis and advanced degenerative changes of the spine, moderate bilateral hip DJD. 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 IMPRESSION: 1. Small layering pleural effusions with adjacent left-greater-than-right lower lobe opacities consistent with atelectasis or pneumonia. 2. Increased stranding at the pancreatic head and uncinate process concerning for pancreatitis. Laboratory and clinical correlation advised. This is associated with slightly prominent hypogastric lymph nodes. 3. Nonobstructive micronephrolithiasis. 4. Diverticulosis without evidence of diverticulitis. 5. Scattered hepatic hypodensities which are too small to characterize, better visualized on the recent contrast-enhanced exam. 6. Periportal adenopathy unchanged compared with the recent prior study. 7. Cholelithiasis. 8. Proctitis versus congestive wall thickening or less likely infiltrating disease. 9. Small volume of ascites. 10. Increased presacral stranding with increased bilateral stranding in the flanks, likely congestive or due to fluid overload. 11. Additional findings described above. Electronically Signed   By: Almira Bar M.D.   On: 06/26/2021 01:19   DG Ankle 2 Views Left  Result Date: 06/26/2021 CLINICAL DATA:  Pain and limited range of motion. History of ankle fractures and surgery. EXAM: LEFT ANKLE - 2 VIEW COMPARISON:  CT scan 04/29/2021 FINDINGS: Lateral sideplate on the fibula with transfixing compression  screws. The fracture appears healed. Two medial malleolar screws transfixing the medial malleolus fracture. This appears to be healed. Single cannulated screw in the AP direction transfixing the posterior malleolar fracture. This appears to be healed. The tibiotalar and subtalar joints are grossly maintained. Moderate-sized calcaneal heel spur. IMPRESSION: Healed trimalleolar fractures with transfixing hardware. No acute bony findings. Electronically Signed   By: Rudie Meyer M.D.   On: 06/26/2021 13:59   US RENAL  Result Date: 06/24/2021 CLINICAL DATA:  Acute on chronic renal insufficiency, renal calculus EXAM: RENAL / URINARY TRACT ULTRASOUND COMPLETE COMPARISON:  05/01/2021 FINDINGS: Right Kidney: Renal measurements: 10.1 x 4.5 x 5.6 cm = volume: 134 mL. Echogenicity within normal limits. No mass or hydronephrosis visualized. Left Kidney: Not visualized due to patient body habitus and bowel gas. Bladder: Decompressed, limiting evaluation. Other: None. IMPRESSION: 1. Nonvisualization of the left kidney due to bowel gas and patient body habitus. 2. Unremarkable right kidney. Electronically Signed   By: Randa Ngo M.D.   On: 06/24/2021 22:43   VAS [place] LOWER EXTREMITY VENOUS (DVT)  Result Date: 06/25/2021  Lower Venous DVT Study Patient Name:  MADGE SOUFFRONT [facility]  Date of Exam:   06/25/2021 Medical Rec #: [id number]         Accession #:    [id number] Date of Birth: 1945-08-15          Patient Gender: F Patient Age:   70 years Exam Location:  [facility] Procedure:      VAS [place] LOWER EXTREMITY VENOUS (DVT) Referring Phys: ERIC [place] --------------------------------------------------------------------------------  Indications: Edema. Other Indications: LLE ankle fracture wth surgical repair (04/2021). Risk Factors: Immobility due to ankle fracture. Limitations: Body habitus and poor ultrasound/tissue interface. Comparison Study: No previous exams Performing Technologist: Jody Hill RVT, RDMS   Examination Guidelines: A complete evaluation includes B-mode imaging, spectral Doppler, color Doppler, and power Doppler as needed of all accessible portions of each vessel. Bilateral testing is considered an integral part of a complete examination. Limited examinations for reoccurring indications may be performed as noted. The reflux portion of the exam is performed with the patient in reverse Trendelenburg.  +---------+---------------+---------+-----------+----------+-------------------+  RIGHT     Compressibility Phasicity Spontaneity Properties Thrombus Aging       +---------+---------------+---------+-----------+----------+-------------------+  CFV       Full            Yes       Yes                                         +---------+---------------+---------+-----------+----------+-------------------+  SFJ       Full                                                                  +---------+---------------+---------+-----------+----------+-------------------+  FV Prox   Full            Yes       Yes                                         +---------+---------------+---------+-----------+----------+-------------------+  FV Mid    Full            Yes       Yes                                         +---------+---------------+---------+-----------+----------+-------------------+  FV Distal Full            Yes  Yes                                         +---------+---------------+---------+-----------+----------+-------------------+  PFV       Full                                                                  +---------+---------------+---------+-----------+----------+-------------------+  POP       Full            Yes       Yes                                         +---------+---------------+---------+-----------+----------+-------------------+  PTV       Full                                             Not well visualized   +---------+---------------+---------+-----------+----------+-------------------+  PERO      Full                                             Not well visualized  +---------+---------------+---------+-----------+----------+-------------------+   +---------+---------------+---------+-----------+----------+-------------------+  LEFT      Compressibility Phasicity Spontaneity Properties Thrombus Aging       +---------+---------------+---------+-----------+----------+-------------------+  CFV       Full            Yes       Yes                                         +---------+---------------+---------+-----------+----------+-------------------+  SFJ       Full                                                                  +---------+---------------+---------+-----------+----------+-------------------+  FV Prox   Full            Yes       Yes                                         +---------+---------------+---------+-----------+----------+-------------------+  FV Mid    Full            Yes       Yes                                         +---------+---------------+---------+-----------+----------+-------------------+  FV Distal Full            Yes       Yes                                         +---------+---------------+---------+-----------+----------+-------------------+  PFV       Full                                                                  +---------+---------------+---------+-----------+----------+-------------------+  POP       Full            Yes       Yes                                         +---------+---------------+---------+-----------+----------+-------------------+  PTV                                                        Not visualized on                                                                this exam            +---------+---------------+---------+-----------+----------+-------------------+  PERO                                                       Not visualized on                                                                 this exam            +---------+---------------+---------+-----------+----------+-------------------+   Left Technical Findings: Not visualized segments include posterior tibial and peroneal veins.   Summary: BILATERAL: - No evidence of deep vein thrombosis seen in the lower extremities, bilaterally. - No evidence of superficial venous thrombosis in the lower extremities, bilaterally. -No evidence of popliteal cyst, bilaterally. Subcutaneous edema noted bilaterally.  LEFT: - Portions of this examination were limited- see technologist comments above.  *See table(s) above for measurements and observations. Electronically signed by Waverly Ferrari MD on 06/25/2021 at 1:57:09 PM.    Final         Scheduled Meds:  sodium chloride   Intravenous Once   busPIRone  15 mg Oral BID   Chlorhexidine Gluconate Cloth  6 each Topical Daily   folic acid  1 mg Oral Daily   Gerhardt's butt cream   Topical BID   lactobacillus acidophilus & bulgar  2 tablet Oral TID   levothyroxine  112 mcg Oral Q0600   sertraline  50 mg Oral Daily   Continuous Infusions:  sodium chloride 75 mL/hr at 06/26/21 1135     LOS: 1 day    Time spent: 46 minutes spent on chart review, discussion with nursing staff, consultants, updating family and interview/physical exam; more than 50% of that time was spent in counseling and/or coordination of care.    Geradine Girt, DO [facility] Available via Epic secure chat 7am-7pm After these hours, please refer to coverage provider listed on [email or web address] 06/26/2021, 2:57 PM

## 2021-06-26 NOTE — Progress Notes (Signed)
Subjective: Pt was admitted Monday to the hospitalist service.  She is now 6 weeks post op from ORIF left ankle trimal fracture.  She has been NWB to date.  F/u ankle films were ordered by Dr. Benjamine Mola   Objective: Vital signs in last 24 hours: Temp:  [97.5 F (36.4 C)-97.8 F (36.6 C)] 97.6 F (36.4 C) (12/14 0716) Pulse Rate:  [58-63] 63 (12/14 0716) Resp:  [16-18] 18 (12/14 0716) BP: (82-109)/(40-57) 109/49 (12/14 0716) SpO2:  [96 %-100 %] 99 % (12/14 0507) Weight:  [143.5 kg] 143.5 kg (12/14 0459)  Intake/Output from previous day: 12/13 0701 - 12/14 0700 In: 2431.6 [P.O.:420; I.V.:1201; Blood:468; IV Piggyback:342.6] Out: 170 [Urine:170] Intake/Output this shift: Total I/O In: 352.5 [Blood:352.5] Out: -   Recent Labs    06/24/21 1807 06/25/21 0331 06/25/21 1929 06/26/21 0325 06/26/21 1017  HGB 8.3* 7.1* 8.4* 6.9* 9.4*   Recent Labs    06/25/21 0331 06/25/21 1929 06/26/21 0325 06/26/21 1017  WBC 9.9  --  6.6  --   RBC 2.32*  --  2.25*  --   HCT 22.9*   < > 21.2* 28.7*  PLT 45*  --  35*  --    < > = values in this interval not displayed.   Recent Labs    06/25/21 0331 06/26/21 0325  NA 136 135  K 4.0 3.8  CL 103 105  CO2 22 22  BUN 67* 66*  CREATININE 2.43* 2.29*  GLUCOSE 100* 82  CALCIUM 7.8* 7.7*   No results for input(s): LABPT, INR in the last 72 hours.  PE:  morbidly obese female.  L ankle with short cam boot.  Incisions healing with scant residual eschar.  No signs of infection  Xray:  AP and lateral NWB radiographs of the left ankle are reviewed from [facility] rads today.  These show appropriate alignment and no hardware failure.  Assessment/Plan: L ankle trimal fracture - her films today show appropriate healing now 6 weeks post op.  She's in a cam boot which can be removed for ROM in PT.  However, her size and poor bone quality preclude advancing her to full WB in the boot.  She can bear weight to a max of 75# in the boot for the next month.  She  will need new films in a month.  Hopefully we can increase her WB then.  PT orders entered.  Pls call with any questions.  [phone].   Toni Arthurs 06/26/2021, 1:16 PM

## 2021-06-27 DIAGNOSIS — R400 Somnolence: Secondary | ICD-10-CM | POA: Diagnosis present

## 2021-06-27 DIAGNOSIS — J189 Pneumonia, unspecified organism: Secondary | ICD-10-CM | POA: Diagnosis not present

## 2021-06-27 DIAGNOSIS — N1831 Chronic kidney disease, stage 3a: Secondary | ICD-10-CM

## 2021-06-27 DIAGNOSIS — F32A Depression, unspecified: Secondary | ICD-10-CM | POA: Diagnosis present

## 2021-06-27 DIAGNOSIS — E8809 Other disorders of plasma-protein metabolism, not elsewhere classified: Secondary | ICD-10-CM | POA: Diagnosis present

## 2021-06-27 DIAGNOSIS — J90 Pleural effusion, not elsewhere classified: Secondary | ICD-10-CM | POA: Diagnosis present

## 2021-06-27 DIAGNOSIS — N179 Acute kidney failure, unspecified: Secondary | ICD-10-CM | POA: Diagnosis not present

## 2021-06-27 DIAGNOSIS — R627 Adult failure to thrive: Secondary | ICD-10-CM | POA: Diagnosis present

## 2021-06-27 DIAGNOSIS — Z8616 Personal history of COVID-19: Secondary | ICD-10-CM | POA: Diagnosis not present

## 2021-06-27 DIAGNOSIS — L89153 Pressure ulcer of sacral region, stage 3: Secondary | ICD-10-CM | POA: Diagnosis not present

## 2021-06-27 LAB — TYPE AND SCREEN
ABO/RH(D): A POS
Antibody Screen: NEGATIVE
Unit division: 0
Unit division: 0

## 2021-06-27 LAB — COMPREHENSIVE METABOLIC PANEL
ALT: 14 U/L (ref 0–44)
AST: 13 U/L — ABNORMAL LOW (ref 15–41)
Albumin: 2.2 g/dL — ABNORMAL LOW (ref 3.5–5.0)
Alkaline Phosphatase: 183 U/L — ABNORMAL HIGH (ref 38–126)
Anion gap: 10 (ref 5–15)
BUN: 63 mg/dL — ABNORMAL HIGH (ref 8–23)
CO2: 21 mmol/L — ABNORMAL LOW (ref 22–32)
Calcium: 7.9 mg/dL — ABNORMAL LOW (ref 8.9–10.3)
Chloride: 105 mmol/L (ref 98–111)
Creatinine, Ser: 2.08 mg/dL — ABNORMAL HIGH (ref 0.44–1.00)
GFR, Estimated: 24 mL/min — ABNORMAL LOW (ref >60)
Glucose, Bld: 73 mg/dL (ref 70–99)
Potassium: 4.5 mmol/L (ref 3.5–5.1)
Sodium: 136 mmol/L (ref 135–145)
Total Bilirubin: 1.1 mg/dL (ref 0.3–1.2)
Total Protein: 4.8 g/dL — ABNORMAL LOW (ref 6.5–8.1)

## 2021-06-27 LAB — CBC
HCT: 27.5 % — ABNORMAL LOW (ref 36.0–46.0)
Hemoglobin: 9 g/dL — ABNORMAL LOW (ref 12.0–15.0)
MCH: 30 pg (ref 26.0–34.0)
MCHC: 32.7 g/dL (ref 30.0–36.0)
MCV: 91.7 fL (ref 80.0–100.0)
Platelets: 31 10*3/uL — ABNORMAL LOW (ref 150–400)
RBC: 3 MIL/uL — ABNORMAL LOW (ref 3.87–5.11)
RDW: 21.6 % — ABNORMAL HIGH (ref 11.5–15.5)
WBC: 6.8 10*3/uL (ref 4.0–10.5)
nRBC: 0.4 % — ABNORMAL HIGH (ref 0.0–0.2)

## 2021-06-27 LAB — PROCALCITONIN: Procalcitonin: 0.22 ng/mL

## 2021-06-27 LAB — BPAM RBC
Blood Product Expiration Date: 202301032359
Blood Product Expiration Date: 202301092359
ISSUE DATE / TIME: 202212131154
ISSUE DATE / TIME: 202212140447
Unit Type and Rh: 6200
Unit Type and Rh: 6200

## 2021-06-27 MED ORDER — ZINC SULFATE 220 (50 ZN) MG PO CAPS
220.0000 mg | ORAL_CAPSULE | Freq: Every day | ORAL | Status: DC
  Administered 2021-06-27 – 2021-07-01 (×5): 220 mg via ORAL
  Filled 2021-06-27 (×5): qty 1

## 2021-06-27 MED ORDER — PROSOURCE PLUS PO LIQD: 30.0000 mL | Freq: Two times a day (BID) | ORAL | Status: DC

## 2021-06-27 MED ORDER — MORPHINE SULFATE (PF) 2 MG/ML IV SOLN
1.0000 mg | Freq: Once | INTRAVENOUS | Status: AC
  Administered 2021-06-27: 1 mg via INTRAVENOUS
  Filled 2021-06-27: qty 1

## 2021-06-27 MED ORDER — JUVEN PO PACK
1.0000 | PACK | Freq: Two times a day (BID) | ORAL | Status: DC
  Administered 2021-06-28 – 2021-07-01 (×6): 1 via ORAL
  Filled 2021-06-27 (×11): qty 1

## 2021-06-27 MED ORDER — ASCORBIC ACID 500 MG PO TABS
500.0000 mg | ORAL_TABLET | Freq: Two times a day (BID) | ORAL | Status: DC
  Administered 2021-06-27 – 2021-07-01 (×9): 500 mg via ORAL
  Filled 2021-06-27 (×9): qty 1

## 2021-06-27 MED ORDER — ENSURE ENLIVE PO LIQD: 237.0000 mL | Freq: Two times a day (BID) | ORAL | Status: DC

## 2021-06-27 MED ORDER — ALBUMIN HUMAN 5 % IV SOLN
12.5000 g | Freq: Once | INTRAVENOUS | Status: AC
  Administered 2021-06-27: 12.5 g via INTRAVENOUS
  Filled 2021-06-27: qty 250

## 2021-06-27 MED ORDER — CEFADROXIL 500 MG PO CAPS
500.0000 mg | ORAL_CAPSULE | Freq: Two times a day (BID) | ORAL | Status: AC
  Administered 2021-06-27 – 2021-06-30 (×7): 500 mg via ORAL
  Filled 2021-06-27 (×8): qty 1

## 2021-06-27 MED ORDER — FENTANYL CITRATE PF 50 MCG/ML IJ SOSY
12.5000 ug | PREFILLED_SYRINGE | Freq: Once | INTRAMUSCULAR | Status: AC
  Administered 2021-06-27: 12.5 ug via INTRAVENOUS
  Filled 2021-06-27: qty 1

## 2021-06-27 MED ORDER — DOXYCYCLINE HYCLATE 100 MG PO TABS
100.0000 mg | ORAL_TABLET | Freq: Two times a day (BID) | ORAL | Status: DC
  Administered 2021-06-27 – 2021-06-30 (×7): 100 mg via ORAL
  Filled 2021-06-27 (×7): qty 1

## 2021-06-27 MED ORDER — ENSURE ENLIVE PO LIQD
237.0000 mL | Freq: Two times a day (BID) | ORAL | Status: DC
  Administered 2021-06-27 – 2021-07-01 (×8): 237 mL via ORAL

## 2021-06-27 MED ORDER — PROSOURCE PLUS PO LIQD
30.0000 mL | Freq: Two times a day (BID) | ORAL | Status: DC
  Administered 2021-06-27 – 2021-07-01 (×9): 30 mL via ORAL
  Filled 2021-06-27 (×9): qty 30

## 2021-06-27 MED ORDER — MIRTAZAPINE 15 MG PO TABS
15.0000 mg | ORAL_TABLET | Freq: Every day | ORAL | Status: DC
  Administered 2021-06-27 – 2021-06-28 (×2): 15 mg via ORAL
  Filled 2021-06-27 (×2): qty 1

## 2021-06-27 MED ORDER — FERROUS SULFATE 325 (65 FE) MG PO TABS
325.0000 mg | ORAL_TABLET | Freq: Every day | ORAL | Status: DC
  Administered 2021-06-28 – 2021-06-30 (×3): 325 mg via ORAL
  Filled 2021-06-27 (×3): qty 1

## 2021-06-27 MED ORDER — LEVOTHYROXINE SODIUM 25 MCG PO TABS
125.0000 ug | ORAL_TABLET | Freq: Every day | ORAL | Status: DC
  Administered 2021-06-28 – 2021-07-01 (×4): 125 ug via ORAL
  Filled 2021-06-27 (×4): qty 1

## 2021-06-27 NOTE — Assessment & Plan Note (Signed)
Blood pressure soft secondary to third spacing. Most of the blood pressure readings are also inaccurate. When measured correctly her MAP is more than 60. Monitor for now.  Avoid antihypertensive medication.Sally Wise

## 2021-06-27 NOTE — TOC Initial Note (Addendum)
Transition of Care [facility]) - Initial/Assessment Note    Patient Details  Name: Sally Wise MRN: [id number] Date of Birth: 1945-07-28  Transition [facility]) CM/SW Contact:    Golda Acre, RN Phone Number: 06/27/2021, 9:16 AM  Clinical Narrative:                  Transition of Care [facility]) Screening Note   Patient Details  Name: Sally Wise Date of Birth: [birth year]   Transition of Care [facility]) CM/SW Contact:    Golda Acre, RN Phone Number: 06/27/2021, 9:16 AM    Transition of Care Department [facility]) has reviewed patient and no TOC needs have been identified at this time. We will continue to monitor patient advancement through interdisciplinary progression rounds. If new patient transition needs arise, please place a TOC consult. PLAN OF CARE: Patient is from [place] and will return to same. Fl2 note sent to [facility] via the hub   Expected Discharge Plan: Skilled Nursing Facility Barriers to Discharge: Continued Medical Work up   Patient Goals and CMS Choice Patient states their goals for this hospitalization and ongoing recovery are:: to go back tp [place] CMS Medicare.gov Compare Post Acute Care list provided to:: Patient    Expected Discharge Plan and Services Expected Discharge Plan: Skilled Nursing Facility   Discharge Planning Services: CM Consult   Living arrangements for the past 2 months: Skilled Nursing Facility                                      Prior Living Arrangements/Services Living arrangements for the past 2 months: Skilled Nursing Facility Lives with:: Facility Resident Patient language and need for interpreter reviewed:: Yes Do you feel safe going back to the place where you live?: Yes            Criminal Activity/Legal Involvement Pertinent to Current Situation/Hospitalization: No - Comment as needed  Activities of Daily Living Home Assistive Devices/Equipment: Environmental consultant (specify type), Wheelchair ADL  Screening (condition at time of admission) Patient's cognitive ability adequate to safely complete daily activities?: Yes Is the patient deaf or have difficulty hearing?: No Does the patient have difficulty seeing, even when wearing glasses/contacts?: No Does the patient have difficulty concentrating, remembering, or making decisions?: No Patient able to express need for assistance with ADLs?: Yes Does the patient have difficulty dressing or bathing?: Yes Independently performs ADLs?: No Communication: Independent Dressing (OT): Needs assistance Is this a change from baseline?: Pre-admission baseline Grooming: Independent Feeding: Independent Bathing: Needs assistance Is this a change from baseline?: Pre-admission baseline Toileting: Needs assistance Is this a change from baseline?: Pre-admission baseline Does the patient have difficulty walking or climbing stairs?: Yes Weakness of Legs: Both Weakness of Arms/Hands: None  Permission Sought/Granted                  Emotional Assessment Appearance:: Appears stated age Attitude/Demeanor/Rapport: Engaged Affect (typically observed): Calm Orientation: : Oriented to Place, Oriented to Self, Oriented to  Time, Oriented to Situation Alcohol / Substance Use: Not Applicable Psych Involvement: No (comment)  Admission diagnosis:  Dehydration [E86.0] Anasarca [R60.1] AKI (acute kidney injury) (HCC) [N17.9] Acute kidney injury superimposed on chronic kidney disease (HCC) [N17.9, N18.9] Acute renal failure superimposed on chronic kidney disease (HCC) [N17.9, N18.9] Patient Active Problem List   Diagnosis Date Noted   Pressure injury of skin 06/25/2021   Acute renal failure superimposed  on chronic kidney disease ([place]) 06/25/2021   Acute kidney injury superimposed on chronic kidney disease ([place]) 06/24/2021   Hypotension 06/24/2021   Ankle fracture 04/28/2021   Chronic diastolic CHF (congestive heart failure) ([place]) 04/28/2021   Anemia  04/28/2021   Prolonged QT interval 04/28/2021   Mucoid diarrhea 03/16/2021   Diarrhea 03/14/2021   Acute on chronic diastolic HF (heart failure) ([place]) 08/24/2020   Body mass index (BMI) 50.0-59.9, adult ([place]) 08/24/2020   Chronic kidney disease with active medical management without dialysis, stage 3 (moderate) ([place]) 08/24/2020   Combined rheumatic disorders of mitral, 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   Congestive rheumatic heart failure ([place]) 08/24/2020   Hardening of the aorta (main artery of the heart) ([place]) 08/24/2020   Kidney stone 08/24/2020   Pure hypercholesterolemia 08/24/2020   Scoliosis of thoracic spine 08/24/2020   Solitary pulmonary nodule 08/24/2020   Other long term (current) drug therapy 08/24/2020   Abnormal findings on diagnostic imaging of other specified body structures 08/24/2020   Acute respiratory failure with hypoxia ([place]) 06/04/2020   Multifocal pneumonia 06/03/2020   Low back pain 12/12/2019   History of total knee replacement, right 0000000   Lichen sclerosus et atrophicus 01/04/2015   LVH (left ventricular hypertrophy)    PAF (paroxysmal atrial fibrillation) ([place]) 07/06/2014   Thrombocytopenia ([place]) 07/06/2014   Hyperglycemia 07/06/2014   Morbid obesity ([place]) 01/25/2013   Hypothyroidism 01/15/2013   History of total knee replacement, left 08/09/2007   PCP:  [name], MD Pharmacy:   [facility], [place] [address] Phone: [phone] Fax: [place], [place] - [address] [place] [address] Phone: [phone] Fax: [phone]     Social Determinants of Health (SDOH) Interventions    Readmission Risk Interventions Readmission Risk Prevention Plan 06/08/2020  Post Dischage Appt Complete  Medication Screening Complete  Transportation Screening Complete  Some recent data  might be hidden

## 2021-06-27 NOTE — Assessment & Plan Note (Signed)
Left ankle fracture.  Treated with Dr. Victorino Dike. Recommendation is "she's in a cam boot which can be removed for ROM in PT.  She can bear weight to a max of 75# in the boot for the next month. She will need new films in a month."

## 2021-06-27 NOTE — Assessment & Plan Note (Signed)
Likely from third spacing. Monitor.  Incentive spirometry.

## 2021-06-27 NOTE — NC FL2 (Signed)
[place] LEVEL OF CARE SCREENING TOOL     IDENTIFICATION  Patient Name: Sally Wise Birthdate: March 20, 1946 Sex: female Admission Date (Current Location): 06/24/2021  [facility] and [place] Number:  Herbalist and Address:  [facility],  [place], [place]      Provider Number: [id number]  Attending Physician Name and Address:  Lavina Hamman, MD  Relative Name and Phone Number:       Current Level of Care: Hospital Recommended Level of Care: [place] Prior Approval Number:    Date Approved/Denied:   PASRR Number: [id number]  Discharge Plan: SNF    Current Diagnoses: Patient Active Problem List   Diagnosis Date Noted   Pressure injury of skin 06/25/2021   Acute renal failure superimposed on chronic kidney disease ([place]) 06/25/2021   Acute kidney injury superimposed on chronic kidney disease ([place]) 06/24/2021   Hypotension 06/24/2021   Ankle fracture 04/28/2021   Chronic diastolic CHF (congestive heart failure) ([place]) 04/28/2021   Anemia 04/28/2021   Prolonged QT interval 04/28/2021   Mucoid diarrhea 03/16/2021   Diarrhea 03/14/2021   Acute on chronic diastolic HF (heart failure) ([place]) 08/24/2020   Body mass index (BMI) 50.0-59.9, adult ([place]) 08/24/2020   Chronic kidney disease with active medical management without dialysis, stage 3 (moderate) ([place]) 08/24/2020   Combined rheumatic disorders of mitral, 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   Congestive rheumatic heart failure ([place]) 08/24/2020   Hardening of the aorta (main artery of the heart) ([place]) 08/24/2020   Kidney stone 08/24/2020   Pure hypercholesterolemia 08/24/2020   Scoliosis of thoracic spine 08/24/2020   Solitary pulmonary nodule 08/24/2020   Other long term (current) drug therapy 08/24/2020   Abnormal findings on diagnostic imaging of other specified body structures 08/24/2020   Acute respiratory failure with hypoxia ([place])  06/04/2020   Multifocal pneumonia 06/03/2020   Low back pain 12/12/2019   History of total knee replacement, right 0000000   Lichen sclerosus et atrophicus 01/04/2015   LVH (left ventricular hypertrophy)    PAF (paroxysmal atrial fibrillation) ([place]) 07/06/2014   Thrombocytopenia ([place]) 07/06/2014   Hyperglycemia 07/06/2014   Morbid obesity ([place]) 01/25/2013   Hypothyroidism 01/15/2013   History of total knee replacement, left 08/09/2007    Orientation RESPIRATION BLADDER Height & Weight     Self, Time, Situation, Place  Normal Continent Weight: (!) 144.7 kg Height:  5\' 8"  (172.7 cm)  BEHAVIORAL SYMPTOMS/MOOD NEUROLOGICAL BOWEL NUTRITION STATUS      Continent Diet (regular)  AMBULATORY STATUS COMMUNICATION OF NEEDS Skin   Extensive Assist Verbally Normal                       Personal Care Assistance Level of Assistance  Bathing, Feeding, Dressing Bathing Assistance: Limited assistance Feeding assistance: Limited assistance Dressing Assistance: Limited assistance     Functional Limitations Info  Sight, Hearing, Speech Sight Info: Adequate Hearing Info: Adequate Speech Info: Adequate    SPECIAL CARE FACTORS FREQUENCY                       Contractures Contractures Info: Not present    Additional Factors Info  Code Status Code Status Info: full             Current Medications (06/27/2021):  This is the current hospital active medication list Current Facility-Administered Medications  Medication Dose Route Frequency Provider Last Rate Last Admin   (feeding supplement)  PROSource Plus liquid 30 mL  30 mL Oral BID BM Rolly Salter, MD   30 mL at 06/27/21 1346   0.9 %  sodium chloride infusion (Manually program via Guardrails IV Fluids)   Intravenous Once [place], Eric J, DO       acetaminophen (TYLENOL) tablet 650 mg  650 mg Oral Q6H PRN Charlsie Quest, MD   650 mg at 06/25/21 4270   Or   acetaminophen (TYLENOL) suppository 650 mg  650 mg Rectal  Q6H PRN Charlsie Quest, MD       ascorbic acid (VITAMIN C) tablet 500 mg  500 mg Oral BID Rolly Salter, MD       cefadroxil (DURICEF) capsule 500 mg  500 mg Oral BID Rolly Salter, MD       Chlorhexidine Gluconate Cloth 2 % PADS 6 each  6 each Topical Daily [place], Eric J, DO   6 each at 06/27/21 6237   cholecalciferol (VITAMIN D3) tablet 1,000 Units  1,000 Units Oral Daily Marlin Canary U, DO   1,000 Units at 06/27/21 6283   doxycycline (VIBRA-TABS) tablet 100 mg  100 mg Oral Q12H Rolly Salter, MD       feeding supplement (ENSURE ENLIVE / ENSURE PLUS) liquid 237 mL  237 mL Oral BID BM Rolly Salter, MD       [START ON 06/28/2021] ferrous sulfate tablet 325 mg  325 mg Oral Q breakfast Rolly Salter, MD       folic acid (FOLVITE) tablet 1 mg  1 mg Oral Daily Darreld Mclean R, MD   1 mg at 06/27/21 1517   Gerhardt's butt cream   Topical BID [place], Eric J, DO   Given at 06/27/21 6160   HYDROcodone-acetaminophen (NORCO/VICODIN) 5-325 MG per tablet 1 tablet  1 tablet Oral Q4H PRN [place], Eric J, DO   1 tablet at 06/27/21 7371   lactobacillus acidophilus & bulgar (LACTINEX) chewable tablet 2 tablet  2 tablet Oral TID [place], Eric J, DO   2 tablet at 06/26/21 2335   [START ON 06/28/2021] levothyroxine (SYNTHROID) tablet 125 mcg  125 mcg Oral QAC breakfast Rolly Salter, MD       loperamide (IMODIUM) capsule 2 mg  2 mg Oral PRN [place], Eric J, DO   2 mg at 06/26/21 0626   nutrition supplement (JUVEN) (JUVEN) powder packet 1 packet  1 packet Oral BID BM Rolly Salter, MD       prochlorperazine (COMPAZINE) injection 10 mg  10 mg Intravenous Q6H PRN Charlsie Quest, MD       senna-docusate (Senokot-S) tablet 1 tablet  1 tablet Oral QHS PRN Charlsie Quest, MD       zinc sulfate capsule 220 mg  220 mg Oral Daily Rolly Salter, MD         Discharge Medications: Please see discharge summary for a list of discharge medications.  Relevant Imaging Results:  Relevant Lab  Results:   Additional Information [phone] [place] [address]  [place], [place]

## 2021-06-27 NOTE — Assessment & Plan Note (Signed)
Presents with dehydration, poor p.o. intake, anorexia. Significant weight loss as well. Dietitian consulted.  Continue supplements.

## 2021-06-27 NOTE — Progress Notes (Signed)
Pt still complained of L leg pain (10/10)despite IV pain med (morphine). Got another IV pain med (Fentanyl)on time dose and given to her. Will reassess and keep monitoring her. Gilford Raid, RN 06/27/2021 2:46 AM

## 2021-06-27 NOTE — Assessment & Plan Note (Addendum)
Body mass index is 48.33 kg/m.  Placing the patient at high risk of poor outcome. Already developing deconditioning including pressure ulcers. At risk for sleep apnea as well. Monitor.

## 2021-06-27 NOTE — Assessment & Plan Note (Addendum)
Patient is on BuSpar and Zoloft. Husband at bedside does not think that this medications are helping. Initially changed to Remeron.  Due to hallucination currently holding.  Hallucination appears to have resolved.

## 2021-06-27 NOTE — Assessment & Plan Note (Addendum)
With resultant third spacing. Multiple etiology.  Treated with IV albumin followed by IV Lasix.  Recommend to improve p.o. intake.  Body mass index is 48.33 kg/m.  Nutrition Problem: Increased nutrient needs Etiology: acute illness, wound healing Nutrition Interventions: Interventions: Juven, Prostat, Ensure Enlive (each supplement provides 350kcal and 20 grams of protein)

## 2021-06-27 NOTE — Assessment & Plan Note (Addendum)
Foam dressing and frequent position changing. Pressure Injury 06/25/21 Coccyx Mid;Lower Stage 3 -  Full thickness tissue loss. Subcutaneous fat may be visible but bone, tendon or muscle are NOT exposed. 1.5 cm by 1 cm (Active)  06/25/21 0130  Location: Coccyx  Location Orientation: Mid;Lower  Staging: Stage 3 -  Full thickness tissue loss. Subcutaneous fat may be visible but bone, tendon or muscle are NOT exposed.  Wound Description (Comments): 1.5 cm by 1 cm  Present on Admission: Yes     Pressure Injury Pretibial Distal;Left Unstageable - Full thickness tissue loss in which the base of the injury is covered by slough (yellow, tan, gray, green or brown) and/or eschar (tan, brown or black) in the wound bed. 2 cm x 1 cm (Active)     Location: Pretibial  Location Orientation: Distal;Left  Staging: Unstageable - Full thickness tissue loss in which the base of the injury is covered by slough (yellow, tan, gray, green or brown) and/or eschar (tan, brown or black) in the wound bed.  Wound Description (Comments): 2 cm x 1 cm  Present on Admission: Yes

## 2021-06-27 NOTE — Progress Notes (Signed)
[facility] Progress Note  Patient: Sally Wise    SNK:[id number]  DOA: 06/24/2021    Date of Service: the patient was seen and examined on 06/27/2021  Brief hospital course: No notes on file  Assessment and Plan: * Acute renal failure superimposed on stage 3a chronic kidney disease (HCC) Please inform creatinine around 1.1-1.2.  GFR around 40s. On presentation serum creatinine around 2.4. Received IV hydration.  No evidence of obstruction.  Also received IV albumin.  Currently renal function improving.  Monitor.  Anemia Chronic anemia as well as thrombocytopenia of unknown etiology.  Follows up with hematology. Hemoglobin dropped to 6.9 on 12/14.  Received 2 PRBC transfusion.  Hemoglobin remained stable.  No active bleeding seen.  Monitor.  Work-up including reticulocyte count, iron level, B12 folic acid normal.  Thrombocytopenia (HCC) Acute on chronic.  At baseline platelet around 50s.  Follows up with hematology.  Suspecting myelodysplastic syndrome.  Currently in 30s.  Stable.  No active bleeding.  Pleural effusion Likely from third spacing. Monitor.  Incentive spirometry.  CAP (community acquired pneumonia) Chest x-ray shows evidence of related effusion with possible pneumonia most likely atelectasis. Started on IV ceftriaxone on doxycycline.  We will switch to p.o.  Hypotension Blood pressure soft secondary to third spacing. Most of the blood pressure readings are also inaccurate. When measured correctly her MAP is more than 60. Monitor for now.  Avoid antihypertensive medication..  Hypoalbuminemia Multiple issues. Currently causing toxicity. Given IV albumin to reduce third spacing as well as treat AKI.  Monitor.  Failure to thrive in adult Presents with dehydration, poor p.o. intake, anorexia. Significant weight loss as well. Dietitian consulted.  Continue supplements.  Hypothyroidism TSH 9, high normal, free T4 0.7 low normal. Will increase Synthroid  dose from 1 1 2-1 25. This would also help with improving appetite and mentation.  Daytime somnolence Likely from combination of BuSpar, Zoloft as well as AKI on top of possible undiagnosed sleep apnea given her BMI. Will discontinue BuSpar.  Change Zoloft to Remeron.  Depression Patient is on BuSpar and Zoloft. Husband at bedside does not think that this medications are helping. Will change to Remeron.  Ankle fracture Left ankle fracture.  Treated with Dr. Victorino Dike. Recommendation is "she's in a cam boot which can be removed for ROM in PT.  She can bear weight to a max of 75# in the boot for the next month.  She will need new films in a month."   Obesity, Class III, BMI 40-49.9 (morbid obesity) (HCC) Placing the patient at high risk of poor outcome. Already developing deconditioning including pressure ulcers. At risk for sleep apnea as well. Monitor.  PAF (paroxysmal atrial fibrillation) (HCC) Currently not on medication for rate control. Also not on any anticoagulation secondary to severe thrombocytopenia and anemia.  Pressure injury of skin, Coccyx Mid;Lower Stage 3 Foam dressing and frequent position changing.    Body mass index is 48.5 kg/m.  Nutrition Problem: Increased nutrient needs Etiology: acute illness, wound healing Pressure Injury 06/25/21 Coccyx Mid;Lower Stage 3 -  Full thickness tissue loss. Subcutaneous fat may be visible but bone, tendon or muscle are NOT exposed. 1.5 cm by 1 cm (Active)  06/25/21 0130  Location: Coccyx  Location Orientation: Mid;Lower  Staging: Stage 3 -  Full thickness tissue loss. Subcutaneous fat may be visible but bone, tendon or muscle are NOT exposed.  Wound Description (Comments): 1.5 cm by 1 cm  Present on Admission: Yes     Pressure  Injury Pretibial Distal;Left Unstageable - Full thickness tissue loss in which the base of the injury is covered by slough (yellow, tan, gray, green or brown) and/or eschar (tan, brown or black) in  the wound bed. 2 cm x 1 cm (Active)     Location: Pretibial  Location Orientation: Distal;Left  Staging: Unstageable - Full thickness tissue loss in which the base of the injury is covered by slough (yellow, tan, gray, green or brown) and/or eschar (tan, brown or black) in the wound bed.  Wound Description (Comments): 2 cm x 1 cm  Present on Admission: Yes     Subjective: Husband reports sleepiness.  Also reports ongoing depression.  Poor p.o. intake.  No nausea no vomiting.  No chest pain abdominal pain.  Pain in the ankle under control.  Objective: Blood pressure on the softer side.  Exam: General: Appear in mild distress, no Rash; Oral Mucosa Clear, moist. no Abnormal Neck Mass Or lumps, Conjunctiva normal  Cardiovascular: S1 and S2 Present, no Murmur, Respiratory: increased respiratory effort, Bilateral Air entry present and bilateral  Crackles, no wheezes Abdomen: Bowel Sound present, Soft and no tenderness Extremities: Bilateral upper and pedal edema Neurology: Drowsy and oriented to time, place, and person affect appropriate. no new focal deficit Gait not checked due to patient safety concerns    Data Reviewed: My review of labs, imaging, notes and other tests is significant for     improving serum creatinine, stable hemoglobin.  Disposition:  Status is: Inpatient  Remains inpatient appropriate because: Monitor for improvement in renal function as well as improvement in mentation and Oral intake   Family Communication: Husband at bedside.  All questions answered.  DVT Prophylaxis: SCDs Start: 06/24/21 2238   Time spent: 35 minutes.   Author: Berle Mull  06/27/2021 5:28 PM  To reach On-call, see care teams to locate the attending and reach out via [email or web address]. Between 7PM-7AM, please contact night-coverage If you still have difficulty reaching the attending provider, please page the [facility] (Director on Call) for [facility] on amion for assistance.

## 2021-06-27 NOTE — Consult Note (Signed)
[facility] Inpatient Consult   06/27/2021  MARIEM SKOLNICK [birth year] [phone]  [facility] Care Management [facility] CM)   Patient was reviewed for chronic disease management services post hospital, noted high risk score for unplanned readmission. Per review, current plan is for return to SNF.  Of note, [facility] Care Management services does not replace or interfere with any services that are arranged by inpatient case management or social work.   Christophe Louis, MSN, RN [facility] [employer] [phone]  Toll free office [phone]

## 2021-06-27 NOTE — Assessment & Plan Note (Addendum)
Chronic anemia as well as thrombocytopenia of unknown etiology.  Follows up with hematology. Hemoglobin dropped to 6.9 on 12/14.  Received 2 PRBC transfusion.  Hemoglobin remained stable.  No active bleeding seen.  Monitor.  Work-up including reticulocyte count, iron level, B12 folic acid normal.  Smear negative for any schistocytes.

## 2021-06-27 NOTE — Assessment & Plan Note (Signed)
Currently not on medication for rate control. Also not on any anticoagulation secondary to severe thrombocytopenia and anemia.

## 2021-06-27 NOTE — Assessment & Plan Note (Signed)
Acute on chronic.  At baseline platelet around 50s.  Follows up with hematology.  Suspecting myelodysplastic syndrome.  Currently in 30s.  Stable.  No active bleeding.

## 2021-06-27 NOTE — Assessment & Plan Note (Addendum)
Baseline creatinine around 1.1-1.2.  GFR around 40s. On presentation serum creatinine around 2.4. Received IV hydration.  No evidence of obstruction.  Also received IV albumin.  Currently renal function improving.  Monitor.

## 2021-06-27 NOTE — Assessment & Plan Note (Addendum)
TSH 9, high normal, free T4 0.7 low normal. Will increase Synthroid dose from 112-125 mcg. This would also help with improving appetite and mentation. Recommend recheck in 1 month.

## 2021-06-27 NOTE — Assessment & Plan Note (Addendum)
Chest x-ray shows evidence of related effusion with possible pneumonia most likely atelectasis. Started on IV ceftriaxone on doxycycline.  Completed p.o. antibiotics for a total of 5-day treatment course.  Last day 12/18.

## 2021-06-27 NOTE — Progress Notes (Signed)
Pt complained of severe pain (10/10) on her left leg, especially L ankle. Given PRN PO pain med, but pt said that it did not work. Got an order of IV morphine one time dose. Will keep monitoring. Gilford Raid, RN 06/27/2021 1:33 AM

## 2021-06-27 NOTE — Progress Notes (Signed)
Physical Therapy Treatment Patient Details Name: Sally Wise MRN: [id number] DOB: 1946-06-10 Today's Date: 06/27/2021   History of Present Illness 75 year old female with PMH: paroxysmal afib, hypothyroidism, anemia/thrombocytopenia, depression/anxiety, morbid obesity, recent left ankle fracture s/p ORIF 05/07/2021 who presented to [facility] ED via EMS from her orthopedists outpatient office on 12/12 with progressive weakness and fatigue.  Since discharge from her ankle fracture, patient has been in a SNF [facility], then [facility])  functionally declining since ankle fx    PT Comments    Pt performed bed level strengthening exercises with assistance. She declined an attempt to sit at edge of bed 2* fear. She reports that since her ankle fx in November, she's only done bed level exercises and has been transferred to a WC with a mechanical lift. Per ortho, she is now progressed to 75 pounds PWB for LLE. Encouraged pt to elevate head of bed during the day to simulate a sitting position.   Recommendations for follow up therapy are one component of a multi-disciplinary discharge planning process, led by the attending physician.  Recommendations may be updated based on patient status, additional functional criteria and insurance authorization.  Follow Up Recommendations  Skilled nursing-short term rehab (<3 hours/day)     Assistance Recommended at Discharge Frequent or constant Supervision/Assistance  Equipment Recommendations  None recommended by PT    Recommendations for Other Services       Precautions / Restrictions Precautions Precautions: Fall Required Braces or Orthoses: Other Brace Other Brace: camboot on L Restrictions LLE Weight Bearing: Partial weight bearing LLE Partial Weight Bearing Percentage or Pounds: 75 pounds per ortho     Mobility  Bed Mobility               General bed mobility comments: pt declined attepting supine to sit 2* fear; agreed to chair position  in bed and pulled up on rails to raise trunk to 80* x 3 trials, held trunk upright for 10 seconds each trial. (bed would not move into full chair position, HOB came up but feet wouldn't lower. Pt positioned with HOB up ~60*)    Transfers                        Ambulation/Gait                   Stairs             Wheelchair Mobility    Modified Rankin (Stroke Patients Only)       Balance                                            Cognition Arousal/Alertness: Awake/alert Behavior During Therapy: WFL for tasks assessed/performed;Flat affect Overall Cognitive Status: Within Functional Limits for tasks assessed                                          Exercises General Exercises - Lower Extremity Ankle Circles/Pumps: AROM;Both;10 reps;Supine Quad Sets: AROM;Both;5 reps;Supine Gluteal Sets: AROM;Both;5 reps;Supine Short Arc Quad: AROM;Both;10 reps;Supine Heel Slides: Both;AAROM;10 reps Hip ABduction/ADduction: AAROM;Both;10 reps;Supine Other Exercises Other Exercises: shoulder rolls x 5 AROM  with HOB elevated ~60* Other Exercises: neck rotation AROM x 5 with HOB elevated ~60*  General Comments        Pertinent Vitals/Pain Pain Score: 0-No pain Faces Pain Scale: No hurt    Home Living                          Prior Function            PT Goals (current goals can now be found in the care plan section) Acute Rehab PT Goals Patient Stated Goal: to be able to move, walk again PT Goal Formulation: With patient Time For Goal Achievement: 07/09/21 Potential to Achieve Goals: Poor Progress towards PT goals: Progressing toward goals    Frequency    Min 2X/week      PT Plan Current plan remains appropriate    Co-evaluation              AM-PAC PT "6 Clicks" Mobility   Outcome Measure  Help needed turning from your back to your side while in a flat bed without using bedrails?:  Total Help needed moving from lying on your back to sitting on the side of a flat bed without using bedrails?: Total Help needed moving to and from a bed to a chair (including a wheelchair)?: Total Help needed standing up from a chair using your arms (e.g., wheelchair or bedside chair)?: Total Help needed to walk in hospital room?: Total Help needed climbing 3-5 steps with a railing? : Total 6 Click Score: 6    End of Session   Activity Tolerance: Patient tolerated treatment well Patient left: in bed;with call bell/phone within reach;with bed alarm set;with family/visitor present Nurse Communication: Mobility status;Need for lift equipment PT Visit Diagnosis: Muscle weakness (generalized) (M62.81);Other abnormalities of gait and mobility (R26.89)     Time: 1039-1100 PT Time Calculation (min) (ACUTE ONLY): 21 min  Charges:  $Therapeutic Activity: 8-22 mins                     Ralene Bathe Kistler PT 06/27/2021  Acute Rehabilitation Services Pager [phone] Office [phone]

## 2021-06-27 NOTE — Progress Notes (Signed)
Initial Nutrition Assessment  DOCUMENTATION CODES:   Morbid obesity  INTERVENTION:  - will order Ensure Enlive BID, each supplement provides 350 kcal and 20 grams of protein. - will order 1 packet Juven BID, each packet provides 95 calories, 2.5 grams of protein (collagen), and 9.8 grams of carbohydrate (3 grams sugar); also contains 7 grams of L-arginine and L-glutamine, 300 mg vitamin C, 15 mg vitamin E, 1.2 mcg vitamin B-12, 9.5 mg zinc, 200 mg calcium, and 1.5 g  Calcium Beta-hydroxy-Beta-methylbutyrate to support wound healing. - will order 30 ml Prosource Plus BID, each supplement provides 100 kcal and 15 grams protein.  - will order 500 mg ascorbic acid BID and 220 mg zinc sulfate/day to aid in wound healing (cleared by MD).     NUTRITION DIAGNOSIS:   Increased nutrient needs related to acute illness, wound healing as evidenced by estimated needs.  GOAL:   Patient will meet greater than or equal to 90% of their needs  MONITOR:   PO intake, Supplement acceptance, Labs, Weight trends  REASON FOR ASSESSMENT:   Consult Poor PO  ASSESSMENT:   75 y.o. female with medical history of afib, hypothyroidism, anemia, thrombocytopenia, depression, anxiety, and morbid obesity. She presented to the ED from SNF due to weakness. She had L ankle fracture in October and underwent ORIF for the same on 05/07/21 and has been at [facility] since d/c from that. She still has been unable to walk with therapy and uses a wheelchair to get around at [facility]. She was experiencing nausea without emesis PTA and has chronic loose BMs which have been unchanged from baseline.  Patient had decreased appetite for several days PTA during which time she was feeling nauseated, no episodes of emesis, and feeling progressively weak during this time. She has been non-ambulatory for the past 2 months. Uses a wheelchair to get around.   Appetite has been improving since admission. Yesterday she ate 0% of breakfast, 75% of  lunch, and 50% of dinner. At breakfast this AM she was able to eat 100% of meal without issue which provided 608 kcal and 16 grams protein.   No chewing or swallowing difficulties at baseline.   She was seen by another [place] RD on 04/30/21 and 05/07/21. At that time skin was intact. Note now with stage 3 and unstageable, full thickness wounds.   Weight today is 319 lb, weight yesterday was 316 lb, and weight on 03/16/21 was 296 lb. Deep pitting edema to BLE which is likely contributing to weight gain.   Weight on 12/07/20 was 324 lb. This indicates 5 lb weight loss (1.5% body weight) in the past 6.5 months.   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   Medications reviewed; 1000 unites cholecalciferol/day, 325 mg ferrous sulfate/day, 1 mg folvite/day, 125 mcg oral synthroid/day, PRN imodium, 2 tablets lactinex TID.    NUTRITION - FOCUSED PHYSICAL EXAM:  Flowsheet Row Most Recent Value  Orbital Region No depletion  Upper Arm Region No depletion  Thoracic and Lumbar Region Unable to assess  Buccal Region No depletion  Temple Region No depletion  Clavicle Bone Region No depletion  Clavicle and Acromion Bone Region No depletion  Scapular Bone Region Unable to assess  Dorsal Hand No depletion  Patellar  Region No depletion  [any potential depletions could be masked by severity of edema]  Anterior Thigh Region No depletion  [any potential depletions could be masked by severity of edema]  Posterior Calf Region No depletion  [any potential depletions could be masked by severity of edema]  Edema (RD Assessment) Severe  [BLE]  Hair Reviewed  Eyes Reviewed  Mouth Reviewed  Nails Reviewed       Diet Order:   Diet Order             Diet Heart Room  service appropriate? Yes; Fluid consistency: Thin  Diet effective now                   EDUCATION NEEDS:   No education needs have been identified at this time  Skin:  Skin Assessment: Skin Integrity Issues: Skin Integrity Issues:: Stage III, Unstageable Stage III: coccyx Unstageable: full thickness to L pretibial area  Last BM:  12/15 (type 6 x1, small amount)  Height:   Ht Readings from Last 1 Encounters:  06/24/21 5' 8"  (1.727 m)    Weight:   Wt Readings from Last 1 Encounters:  06/27/21 (!) 144.7 kg     Estimated Nutritional Needs:  Kcal:  2200-2400 kcal Protein:  120-130 grams Fluid:  >/= 2.4 L/day     Jarome Matin, MS, RD, LDN, CNSC Inpatient Clinical Dietitian RD pager # available in [facility]  After hours/weekend pager # available in [facility]

## 2021-06-27 NOTE — Assessment & Plan Note (Addendum)
Likely from combination of BuSpar, Zoloft as well as AKI on top of possible undiagnosed sleep apnea given her BMI. Will discontinue BuSpar.  Changed Zoloft to Remeron. Due to hallucination currently holding Remeron.

## 2021-06-28 DIAGNOSIS — N179 Acute kidney failure, unspecified: Secondary | ICD-10-CM | POA: Diagnosis not present

## 2021-06-28 DIAGNOSIS — N1831 Chronic kidney disease, stage 3a: Secondary | ICD-10-CM | POA: Diagnosis not present

## 2021-06-28 LAB — BASIC METABOLIC PANEL
Anion gap: 10 (ref 5–15)
BUN: 56 mg/dL — ABNORMAL HIGH (ref 8–23)
CO2: 20 mmol/L — ABNORMAL LOW (ref 22–32)
Calcium: 8 mg/dL — ABNORMAL LOW (ref 8.9–10.3)
Chloride: 108 mmol/L (ref 98–111)
Creatinine, Ser: 1.79 mg/dL — ABNORMAL HIGH (ref 0.44–1.00)
GFR, Estimated: 29 mL/min — ABNORMAL LOW (ref >60)
Glucose, Bld: 94 mg/dL (ref 70–99)
Potassium: 3.6 mmol/L (ref 3.5–5.1)
Sodium: 138 mmol/L (ref 135–145)

## 2021-06-28 LAB — CBC WITH DIFFERENTIAL/PLATELET
Abs Immature Granulocytes: 0.03 10*3/uL (ref 0.00–0.07)
Basophils Absolute: 0 10*3/uL (ref 0.0–0.1)
Basophils Relative: 0 %
Eosinophils Absolute: 0.2 10*3/uL (ref 0.0–0.5)
Eosinophils Relative: 3 %
HCT: 27 % — ABNORMAL LOW (ref 36.0–46.0)
Hemoglobin: 8.5 g/dL — ABNORMAL LOW (ref 12.0–15.0)
Immature Granulocytes: 1 %
Lymphocytes Relative: 44 %
Lymphs Abs: 1.9 10*3/uL (ref 0.7–4.0)
MCH: 30 pg (ref 26.0–34.0)
MCHC: 31.5 g/dL (ref 30.0–36.0)
MCV: 95.4 fL (ref 80.0–100.0)
Monocytes Absolute: 0.6 10*3/uL (ref 0.1–1.0)
Monocytes Relative: 14 %
Neutro Abs: 1.6 10*3/uL — ABNORMAL LOW (ref 1.7–7.7)
Neutrophils Relative %: 38 %
Platelets: 31 10*3/uL — ABNORMAL LOW (ref 150–400)
RBC: 2.83 MIL/uL — ABNORMAL LOW (ref 3.87–5.11)
RDW: 21.3 % — ABNORMAL HIGH (ref 11.5–15.5)
WBC: 4.4 10*3/uL (ref 4.0–10.5)
nRBC: 0.7 % — ABNORMAL HIGH (ref 0.0–0.2)

## 2021-06-28 LAB — RESP PANEL BY RT-PCR (FLU A&B, COVID) ARPGX2
Influenza A by PCR: NEGATIVE
Influenza B by PCR: NEGATIVE
SARS Coronavirus 2 by RT PCR: POSITIVE — AB

## 2021-06-28 LAB — MAGNESIUM: Magnesium: 2.1 mg/dL (ref 1.7–2.4)

## 2021-06-28 MED ORDER — LACTINEX PO CHEW: 2.0000 | CHEWABLE_TABLET | Freq: Three times a day (TID) | ORAL | 0 refills | Status: DC

## 2021-06-28 MED ORDER — CEFADROXIL 500 MG PO CAPS: 500.0000 mg | ORAL_CAPSULE | Freq: Two times a day (BID) | ORAL | 0 refills | Status: AC

## 2021-06-28 MED ORDER — GERHARDT'S BUTT CREAM: 1.0000 "application " | TOPICAL_CREAM | Freq: Two times a day (BID) | CUTANEOUS | 0 refills | Status: DC

## 2021-06-28 MED ORDER — HYDROCODONE-ACETAMINOPHEN 5-325 MG PO TABS: 1.0000 | ORAL_TABLET | Freq: Four times a day (QID) | ORAL | 0 refills | Status: DC | PRN

## 2021-06-28 MED ORDER — ENSURE ENLIVE PO LIQD: 237.0000 mL | Freq: Two times a day (BID) | ORAL | 0 refills | Status: DC

## 2021-06-28 MED ORDER — DOXYCYCLINE HYCLATE 100 MG PO TABS: 100.0000 mg | ORAL_TABLET | Freq: Two times a day (BID) | ORAL | 0 refills | Status: AC

## 2021-06-28 MED ORDER — PROSOURCE PLUS PO LIQD: 30.0000 mL | Freq: Two times a day (BID) | ORAL | 0 refills | Status: DC

## 2021-06-28 MED ORDER — JUVEN PO PACK: 1.0000 | PACK | Freq: Two times a day (BID) | ORAL | 0 refills | Status: DC

## 2021-06-28 MED ORDER — MIRTAZAPINE 15 MG PO TABS: 15.0000 mg | ORAL_TABLET | Freq: Every day | ORAL | 0 refills | Status: DC

## 2021-06-28 MED ORDER — LEVOTHYROXINE SODIUM 125 MCG PO TABS: 125.0000 ug | ORAL_TABLET | Freq: Every day | ORAL | 0 refills | Status: DC

## 2021-06-28 MED ORDER — ASCORBIC ACID 500 MG PO TABS: 500.0000 mg | ORAL_TABLET | Freq: Two times a day (BID) | ORAL | 0 refills | Status: DC

## 2021-06-28 MED ORDER — ZINC SULFATE 220 (50 ZN) MG PO CAPS: 220.0000 mg | ORAL_CAPSULE | Freq: Every day | ORAL | 0 refills | Status: DC

## 2021-06-28 NOTE — Plan of Care (Signed)
  Problem: Education: Goal: Knowledge of General Education information will improve Description Including pain rating scale, medication(s)/side effects and non-pharmacologic comfort measures Outcome: Progressing   

## 2021-06-28 NOTE — Progress Notes (Signed)
[facility] PROGRESS NOTE  Patient: Sally Wise [id number]   PCP: Daisy Floro, MD DOB: 01-17-1946   DOA: 06/24/2021   DOS: 06/28/2021    Subjective: Called to see the patient at bedside due to concern raised from the husband with regards to confusion.  Objective:  Vitals:   06/28/21 1654 06/28/21 1953  BP: (!) 94/47 (!) 95/55  Pulse: 81 83  Resp: 18 16  Temp: 98.1 F (36.7 C) 98 F (36.7 C)  SpO2: 99% 99%    Mental status AAOx3, speech normal, attention normal,  Cranial Nerves PERRL, EOM normal and present, facial sensation to light touch present,  Motor strength bilateral equal strength 5/5,  Sensation present to light touch,  Cerebellar test normal finger nose finger.  Assessment and plan: Reportedly episode happened when the patient woke up from sleep.  She felt that she was actually at [facility] and not in the hospital. At the time of my evaluation I did not see any focal deficit. Reassured the husband and the patient. Continue current care plan  Author: Lynden Oxford, MD [facility] 06/28/2021 9:12 PM   If 7PM-7AM, please contact night-coverage at [email or web address]

## 2021-06-28 NOTE — TOC Progression Note (Signed)
Transition of Care [facility]) - Progression Note    Patient Details  Name: Sally Wise MRN: [id number] Date of Birth: 13-Jan-1946  [facility] CM/SW Contact  Golda Acre, RN Phone Number: 06/28/2021, 12:47 PM  Clinical Narrative:    Per dr. Allena Katz can go back today.  [facility] at [facility] will go to room 102.  [employer] AUTH FOR [place] STARTED AT 1245/ Reola Calkins number is [phone] Berkley Harvey is pending.   Expected Discharge Plan: Skilled Nursing Facility Barriers to Discharge: Continued Medical Work up  Expected Discharge Plan and Services Expected Discharge Plan: Skilled Nursing Facility   Discharge Planning Services: CM Consult   Living arrangements for the past 2 months: Skilled Nursing Facility                                       Social Determinants of Health (SDOH) Interventions    Readmission Risk Interventions Readmission Risk Prevention Plan 06/08/2020  Post Dischage Appt Complete  Medication Screening Complete  Transportation Screening Complete  Some recent data might be hidden

## 2021-06-28 NOTE — Discharge Summary (Addendum)
Physician Discharge Summary   Patient name: Sally Wise  Admit date:     06/24/2021  Discharge date: 06/28/2021  Discharge Physician: Berle Mull   PCP: [name], MD   Recommendations at discharge: follow up with PCP, Oncology and Orthopedics as recommended.  Recheck CBC in 1 week  Discharge Diagnoses Principal Problem:   Acute renal failure superimposed on stage 3a chronic kidney disease (HCC) Active Problems:   Thrombocytopenia (HCC)   Anemia   Hypotension   CAP (community acquired pneumonia)   Pleural effusion   Hypothyroidism   Failure to thrive in adult   Hypoalbuminemia   PAF (paroxysmal atrial fibrillation) (HCC)   Obesity, Class III, BMI 40-49.9 (morbid obesity) (HCC)   Ankle fracture   Depression   Daytime somnolence   Pressure injury of skin, Coccyx Mid;Lower Stage 3  * Acute renal failure superimposed on stage 3a chronic kidney disease ([place]) Please inform creatinine around 1.1-1.2.  GFR around 40s. On presentation serum creatinine around 2.4. Received IV hydration.  No evidence of obstruction.  Also received IV albumin.  Currently renal function improving.  Monitor.  Anemia Chronic anemia as well as thrombocytopenia of unknown etiology.  Follows up with hematology. Hemoglobin dropped to 6.9 on 12/14.  Received 2 PRBC transfusion.  Hemoglobin remained stable.  No active bleeding seen.  Monitor.  Work-up including reticulocyte count, iron level, [id number] folic acid normal.  Thrombocytopenia (HCC) Acute on chronic.  At baseline platelet around 50s.  Follows up with hematology.  Suspecting myelodysplastic syndrome.  Currently in 42s.  Stable.  No active bleeding.  Pleural effusion Likely from third spacing. Monitor.  Incentive spirometry.  CAP (community acquired pneumonia) Chest x-ray shows evidence of related effusion with possible pneumonia most likely atelectasis. Started on IV ceftriaxone on doxycycline.  We will switch to  p.o.  Hypotension Blood pressure soft secondary to third spacing. Most of the blood pressure readings are also inaccurate. When measured correctly her MAP is more than 60. Monitor for now.  Avoid antihypertensive medication..  Hypoalbuminemia Multiple issues. Currently causing toxicity. Given IV albumin to reduce third spacing as well as treat AKI.  Monitor.  Failure to thrive in adult Presents with dehydration, poor p.o. intake, anorexia. Significant weight loss as well. Dietitian consulted.  Continue supplements.  Hypothyroidism TSH 9, high normal, free T4 0.7 low normal. Will increase Synthroid dose from 1 1 2-1 25. This would also help with improving appetite and mentation.  Daytime somnolence Likely from combination of BuSpar, Zoloft as well as AKI on top of possible undiagnosed sleep apnea given her BMI. Will discontinue BuSpar.  Change Zoloft to Remeron.  Depression Patient is on BuSpar and Zoloft. Husband at bedside does not think that this medications are helping. Will change to Remeron.  Ankle fracture Left ankle fracture.  Treated with Dr. Doran Durand. Recommendation is "she's in a cam boot which can be removed for ROM in PT.  She can bear weight to a max of 75# in the boot for the next month.  She will need new films in a month."   Obesity, Class III, BMI 40-49.9 (morbid obesity) ([place]) Placing the patient at high risk of poor outcome. Already developing deconditioning including pressure ulcers. At risk for sleep apnea as well. Monitor.  PAF (paroxysmal atrial fibrillation) (HCC) Currently not on medication for rate control. Also not on any anticoagulation secondary to severe thrombocytopenia and anemia.  Pressure injury of skin, Coccyx Mid;Lower Stage 3 Foam dressing and frequent position changing.  Procedures performed: none   Condition at discharge: good  Exam General: Appear in mild distress, no Rash; Oral Mucosa Clear, moist. no Abnormal Neck Mass  Or lumps, Conjunctiva normal  Cardiovascular: S1 and S2 Present, no Murmur, Respiratory: good respiratory effort, Bilateral Air entry present and CTA, no Crackles, no wheezes Abdomen: Bowel Sound present, Soft and no tenderness Extremities: generalized edema Neurology: alert and oriented to time, place, and person affect appropriate. no new focal deficit Gait not checked due to patient safety concerns     Disposition: Skilled nursing facility  Discharge time: greater than 30 minutes.  Follow-up Information     [name], MD. Schedule an appointment as soon as possible for a visit in 1 week(s).   Specialty: Family Medicine Why: with CBC and BMP Contact information: [address] [place] [address] [phone]         Wylene Simmer, MD. Schedule an appointment as soon as possible for a visit in 2 week(s).   Specialty: Orthopedic Surgery Contact information: [address] [place] [address] [phone]         Wyatt Portela, MD. Schedule an appointment as soon as possible for a visit in 1 week(s).   Specialty: Oncology Why: with CBC and BMP Contact information: [address] [place] [address] [phone]                 Allergies as of 06/28/2021   No Known Allergies      Medication List     STOP taking these medications    aspirin EC 81 MG tablet   busPIRone 15 MG tablet Commonly known as: BUSPAR   diltiazem 180 MG 24 hr capsule Commonly known as: CARDIZEM CD   furosemide 20 MG tablet Commonly known as: LASIX   melatonin 3 MG Tabs tablet   sertraline 50 MG tablet Commonly known as: ZOLOFT       TAKE these medications    (feeding supplement) PROSource Plus liquid Take 30 mLs by mouth 2 (two) times daily between meals.   feeding supplement Liqd Take 237 mLs by mouth 2 (two) times daily between meals.   nutrition supplement (JUVEN) Pack Take 1 packet by mouth 2 (two) times daily  between meals.   ascorbic acid 500 MG tablet Commonly known as: VITAMIN C Take 1 tablet (500 mg total) by mouth 2 (two) times daily.   bisacodyl 5 MG EC tablet Generic drug: bisacodyl Take 5 mg by mouth daily as needed for moderate constipation.   cefadroxil 500 MG capsule Commonly known as: DURICEF Take 1 capsule (500 mg total) by mouth 2 (two) times daily for 3 days.   doxycycline 100 MG tablet Commonly known as: VIBRA-TABS Take 1 tablet (100 mg total) by mouth 2 (two) times daily for 3 days.   ferrous sulfate 325 (65 FE) MG tablet Take 325 mg by mouth daily with breakfast.   folic acid 1 MG tablet Commonly known as: FOLVITE Take 1 tablet (1 mg total) by mouth daily.   Gerhardt's butt cream Crea Apply 1 application topically 2 (two) times daily.   HYDROcodone-acetaminophen 5-325 MG tablet Commonly known as: NORCO/VICODIN Take 1 tablet by mouth every 6 (six) hours as needed for moderate pain or severe pain.   lactobacillus acidophilus & bulgar chewable tablet Chew 2 tablets by mouth 3 (three) times daily.   levothyroxine 125 MCG tablet Commonly known as: Euthyrox Take 1 tablet (125 mcg total) by mouth daily before  breakfast. Start taking on: June 29, 2021 What changed:  medication strength how much to take   mirtazapine 15 MG tablet Commonly known as: REMERON Take 1 tablet (15 mg total) by mouth at bedtime.   ondansetron 4 MG tablet Commonly known as: ZOFRAN Take 4 mg by mouth every 8 (eight) hours as needed for nausea or vomiting.   Vitamin D3 125 MCG (5000 UT) Tabs Take 1 tablet by mouth daily.   zinc sulfate 220 (50 Zn) MG capsule Take 1 capsule (220 mg total) by mouth daily. Start taking on: June 29, 2021               Discharge Care Instructions  (From admission, onward)           Start     Ordered   06/28/21 0000  Discharge wound care:       Comments: Perform daily.   Use a Sacral foam dressing over the sacrum/coccyx. Lift  each shift and evaluate the Deep Tissue Pressure Injury to the area.  Apply iodine from the swabsticks or swab pads from clean utility to the left inner ankle scab.  Allow to air dry.   06/28/21 1142            CT ABDOMEN PELVIS WO CONTRAST  Result Date: 06/26/2021 CLINICAL DATA:  Increasing weakness with decreasing urine output. EXAM: CT ABDOMEN AND PELVIS WITHOUT CONTRAST TECHNIQUE: Multidetector CT imaging of the abdomen and pelvis was performed following the standard protocol without IV contrast. COMPARISON:  Chest CT no contrast 04/23/2020, abdomen and pelvis CT with IV contrast 03/15/2021. FINDINGS: Lower chest: Mild cardiomegaly with coronary artery calcifications is again seen. There is development of small bilateral layering pleural effusions, with adjacent opacity in the left greater than right lower lobes which could be atelectasis or pneumonia. There is an 8 mm stable nodule in the posteromedial aspect of the right middle lobe, unchanged since 04/23/2020. Hepatobiliary: 20 cm length mildly steatotic liver. Small subcentimeter scattered hypodensities which are too small to characterize but seem unchanged. These were better visualized on the contrasted exam. Some images may suggest early cirrhotic liver configuration. There are multiple stones in the gallbladder and a distended gallbladder, without wall thickening or increased biliary dilatation with common bile duct stable in prominence at 8.6 mm. Pancreas: Fatty atrophic with increased stranding at the pancreatic head and uncinate process concerning for acute pancreatitis. There is no peripancreatic fluid, no mass is seen or ductal dilatation. Spleen: Slightly prominent 12.9 cm coronal and otherwise unremarkable without contrast. A faint 1.6 cm hypodensity was visible on the contrast exam but is not able to be seen without contrast. Adrenals/Urinary Tract: There is no adrenal mass. No focal abnormality in the unenhanced renal cortex. There  are multiple scattered bilateral punctate nonobstructive caliceal stones. There is no hydronephrosis or ureteral stone. The bladder is contracted around a Foley balloon and not well seen. Stomach/Bowel: No dilatation or wall thickening including the appendix is seen through the sigmoid colon. Diffuse diverticulosis greatest along the left colon is seen , no findings of acute diverticulitis. There is increased circumferential rectal thickening probably due to proctitis, less likely infiltrating disease given the short interval time frame of its appearance. There is increased perirectal stranding. Vascular/Lymphatic: Aortic atherosclerosis. Mildly enlarged periportal lymph nodes are again noted up to 1.4 cm in short axis, unchanged from 3 months ago, with slight increased prominence of hypogastric lymph nodes today and no further adenopathy. Reproductive: A calcified uterine fibroid is again  noted of the right-sided fundus. 3.5 x 2.5 cm right ovarian cystic lesion with calcification is stable since a remote previous CT of 11/08/2007, presumably inconsequential. Other: There is increasing presacral edema possibly related to proctitis or possibly congestive. There is increased body wall edema in the flanks, likely either congestive or from fluid overload and small amount of posterior deep pelvic and pericolic gutter ascites. There is no free air, free hemorrhage or abscess. Musculoskeletal: There is osteopenia , levorotary lumbar scoliosis and advanced degenerative changes of the spine, moderate bilateral hip DJD. 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 IMPRESSION: 1. Small layering pleural effusions with adjacent left-greater-than-right lower lobe opacities consistent with atelectasis or pneumonia. 2. Increased stranding at the pancreatic head and uncinate process concerning for pancreatitis. Laboratory and clinical correlation advised. This is associated with slightly prominent hypogastric lymph nodes. 3.  Nonobstructive micronephrolithiasis. 4. Diverticulosis without evidence of diverticulitis. 5. Scattered hepatic hypodensities which are too small to characterize, better visualized on the recent contrast-enhanced exam. 6. Periportal adenopathy unchanged compared with the recent prior study. 7. Cholelithiasis. 8. Proctitis versus congestive wall thickening or less likely infiltrating disease. 9. Small volume of ascites. 10. Increased presacral stranding with increased bilateral stranding in the flanks, likely congestive or due to fluid overload. 11. Additional findings described above. Electronically Signed   By: Telford Nab M.D.   On: 06/26/2021 01:19   DG Ankle 2 Views Left  Result Date: 06/26/2021 CLINICAL DATA:  Pain and limited range of motion. History of ankle fractures and surgery. EXAM: LEFT ANKLE - 2 VIEW COMPARISON:  CT scan 04/29/2021 FINDINGS: Lateral sideplate on the fibula with transfixing compression screws. The fracture appears healed. Two medial malleolar screws transfixing the medial malleolus fracture. This appears to be healed. Single cannulated screw in the AP direction transfixing the posterior malleolar fracture. This appears to be healed. The tibiotalar and subtalar joints are grossly maintained. Moderate-sized calcaneal heel spur. IMPRESSION: Healed trimalleolar fractures with transfixing hardware. No acute bony findings. Electronically Signed   By: Marijo Sanes M.D.   On: 06/26/2021 13:59   US RENAL  Result Date: 06/24/2021 CLINICAL DATA:  Acute on chronic renal insufficiency, renal calculus EXAM: RENAL / URINARY TRACT ULTRASOUND COMPLETE COMPARISON:  05/01/2021 FINDINGS: Right Kidney: Renal measurements: 10.1 x 4.5 x 5.6 cm = volume: 134 mL. Echogenicity within normal limits. No mass or hydronephrosis visualized. Left Kidney: Not visualized due to patient body habitus and bowel gas. Bladder: Decompressed, limiting evaluation. Other: None. IMPRESSION: 1. Nonvisualization of the  left kidney due to bowel gas and patient body habitus. 2. Unremarkable right kidney. Electronically Signed   By: Randa Ngo M.D.   On: 06/24/2021 22:43   VAS [place] LOWER EXTREMITY VENOUS (DVT)  Result Date: 06/25/2021  Lower Venous DVT Study Patient Name:  Sally Wise [facility]  Date of Exam:   06/25/2021 Medical Rec #: [id number]         Accession #:    [id number] Date of Birth: 1945/11/25          Patient Gender: F Patient Age:   1 years Exam Location:  [facility] Procedure:      VAS [place] LOWER EXTREMITY VENOUS (DVT) Referring Phys: ERIC [place] --------------------------------------------------------------------------------  Indications: Edema. Other Indications: LLE ankle fracture wth surgical repair (04/2021). Risk Factors: Immobility due to ankle fracture. Limitations: Body habitus and poor ultrasound/tissue interface. Comparison Study: No previous exams Performing Technologist: Jody Hill RVT, RDMS  Examination Guidelines: A complete evaluation includes B-mode imaging,  spectral Doppler, color Doppler, and power Doppler as needed of all accessible portions of each vessel. Bilateral testing is considered an integral part of a complete examination. Limited examinations for reoccurring indications may be performed as noted. The reflux portion of the exam is performed with the patient in reverse Trendelenburg.  +---------+---------------+---------+-----------+----------+-------------------+  RIGHT     Compressibility Phasicity Spontaneity Properties Thrombus Aging       +---------+---------------+---------+-----------+----------+-------------------+  CFV       Full            Yes       Yes                                         +---------+---------------+---------+-----------+----------+-------------------+  SFJ       Full                                                                  +---------+---------------+---------+-----------+----------+-------------------+  FV Prox   Full            Yes       Yes                                          +---------+---------------+---------+-----------+----------+-------------------+  FV Mid    Full            Yes       Yes                                         +---------+---------------+---------+-----------+----------+-------------------+  FV Distal Full            Yes       Yes                                         +---------+---------------+---------+-----------+----------+-------------------+  PFV       Full                                                                  +---------+---------------+---------+-----------+----------+-------------------+  POP       Full            Yes       Yes                                         +---------+---------------+---------+-----------+----------+-------------------+  PTV       Full  Not well visualized  +---------+---------------+---------+-----------+----------+-------------------+  PERO      Full                                             Not well visualized  +---------+---------------+---------+-----------+----------+-------------------+   +---------+---------------+---------+-----------+----------+-------------------+  LEFT      Compressibility Phasicity Spontaneity Properties Thrombus Aging       +---------+---------------+---------+-----------+----------+-------------------+  CFV       Full            Yes       Yes                                         +---------+---------------+---------+-----------+----------+-------------------+  SFJ       Full                                                                  +---------+---------------+---------+-----------+----------+-------------------+  FV Prox   Full            Yes       Yes                                         +---------+---------------+---------+-----------+----------+-------------------+  FV Mid    Full            Yes       Yes                                          +---------+---------------+---------+-----------+----------+-------------------+  FV Distal Full            Yes       Yes                                         +---------+---------------+---------+-----------+----------+-------------------+  PFV       Full                                                                  +---------+---------------+---------+-----------+----------+-------------------+  POP       Full            Yes       Yes                                         +---------+---------------+---------+-----------+----------+-------------------+  PTV  Not visualized on                                                                this exam            +---------+---------------+---------+-----------+----------+-------------------+  PERO                                                       Not visualized on                                                                this exam            +---------+---------------+---------+-----------+----------+-------------------+   Left Technical Findings: Not visualized segments include posterior tibial and peroneal veins.   Summary: BILATERAL: - No evidence of deep vein thrombosis seen in the lower extremities, bilaterally. - No evidence of superficial venous thrombosis in the lower extremities, bilaterally. -No evidence of popliteal cyst, bilaterally. Subcutaneous edema noted bilaterally.  LEFT: - Portions of this examination were limited- see technologist comments above.  *See table(s) above for measurements and observations. Electronically signed by Waverly Ferrari MD on 06/25/2021 at 1:57:09 PM.    Final    Results for orders placed or performed during the hospital encounter of 06/24/21  Urine Culture     Status: None   Collection Time: 06/25/21  9:51 AM   Specimen: Urine, Catheterized  Result Value Ref Range Status   Specimen Description   Final    URINE, CATHETERIZED Performed at [facility], [address] [place], [place] [address]    Special Requests   Final    NONE Performed at [facility], [address] [place], [place] [address]    Culture   Final    NO GROWTH Performed at [facility], [address] [place], [place] [address]    Report Status 06/26/2021 FINAL  Final    Signed:  Lynden Oxford MD.  [facility] 06/28/2021, 11:44 AM

## 2021-06-29 ENCOUNTER — Inpatient Hospital Stay (HOSPITAL_COMMUNITY): Payer: Medicare Other

## 2021-06-29 DIAGNOSIS — N179 Acute kidney failure, unspecified: Secondary | ICD-10-CM | POA: Diagnosis not present

## 2021-06-29 DIAGNOSIS — Z8616 Personal history of COVID-19: Secondary | ICD-10-CM | POA: Diagnosis present

## 2021-06-29 DIAGNOSIS — N1831 Chronic kidney disease, stage 3a: Secondary | ICD-10-CM | POA: Diagnosis not present

## 2021-06-29 DIAGNOSIS — Z978 Presence of other specified devices: Secondary | ICD-10-CM

## 2021-06-29 DIAGNOSIS — J90 Pleural effusion, not elsewhere classified: Secondary | ICD-10-CM | POA: Diagnosis not present

## 2021-06-29 DIAGNOSIS — R0602 Shortness of breath: Secondary | ICD-10-CM | POA: Diagnosis not present

## 2021-06-29 LAB — CBC WITH DIFFERENTIAL/PLATELET
Abs Immature Granulocytes: 0.02 10*3/uL (ref 0.00–0.07)
Abs Immature Granulocytes: 0.02 10*3/uL (ref 0.00–0.07)
Basophils Absolute: 0 10*3/uL (ref 0.0–0.1)
Basophils Absolute: 0 10*3/uL (ref 0.0–0.1)
Basophils Relative: 0 %
Basophils Relative: 0 %
Eosinophils Absolute: 0.1 10*3/uL (ref 0.0–0.5)
Eosinophils Absolute: 0.2 10*3/uL (ref 0.0–0.5)
Eosinophils Relative: 3 %
Eosinophils Relative: 3 %
HCT: 24.5 % — ABNORMAL LOW (ref 36.0–46.0)
HCT: 27.5 % — ABNORMAL LOW (ref 36.0–46.0)
Hemoglobin: 8 g/dL — ABNORMAL LOW (ref 12.0–15.0)
Hemoglobin: 8.9 g/dL — ABNORMAL LOW (ref 12.0–15.0)
Immature Granulocytes: 0 %
Immature Granulocytes: 0 %
Lymphocytes Relative: 51 %
Lymphocytes Relative: 52 %
Lymphs Abs: 2.5 10*3/uL (ref 0.7–4.0)
Lymphs Abs: 2.7 10*3/uL (ref 0.7–4.0)
MCH: 29.9 pg (ref 26.0–34.0)
MCH: 30.1 pg (ref 26.0–34.0)
MCHC: 32.7 g/dL (ref 30.0–36.0)
MCHC: 32.4 g/dL (ref 30.0–36.0)
MCV: 91.4 fL (ref 80.0–100.0)
MCV: 92.9 fL (ref 80.0–100.0)
Monocytes Absolute: 0.8 10*3/uL (ref 0.1–1.0)
Monocytes Absolute: 0.8 10*3/uL (ref 0.1–1.0)
Monocytes Relative: 16 %
Monocytes Relative: 15 %
Neutro Abs: 1.5 10*3/uL — ABNORMAL LOW (ref 1.7–7.7)
Neutro Abs: 1.6 10*3/uL — ABNORMAL LOW (ref 1.7–7.7)
Neutrophils Relative %: 30 %
Neutrophils Relative %: 30 %
Platelets: 30 10*3/uL — ABNORMAL LOW (ref 150–400)
Platelets: 32 10*3/uL — ABNORMAL LOW (ref 150–400)
RBC: 2.68 MIL/uL — ABNORMAL LOW (ref 3.87–5.11)
RBC: 2.96 MIL/uL — ABNORMAL LOW (ref 3.87–5.11)
RDW: 20.9 % — ABNORMAL HIGH (ref 11.5–15.5)
RDW: 21 % — ABNORMAL HIGH (ref 11.5–15.5)
WBC: 4.9 10*3/uL (ref 4.0–10.5)
WBC: 5.2 10*3/uL (ref 4.0–10.5)
nRBC: 0.6 % — ABNORMAL HIGH (ref 0.0–0.2)
nRBC: 0.4 % — ABNORMAL HIGH (ref 0.0–0.2)

## 2021-06-29 LAB — BASIC METABOLIC PANEL
Anion gap: 7 (ref 5–15)
BUN: 57 mg/dL — ABNORMAL HIGH (ref 8–23)
CO2: 21 mmol/L — ABNORMAL LOW (ref 22–32)
Calcium: 7.7 mg/dL — ABNORMAL LOW (ref 8.9–10.3)
Chloride: 108 mmol/L (ref 98–111)
Creatinine, Ser: 1.42 mg/dL — ABNORMAL HIGH (ref 0.44–1.00)
GFR, Estimated: 38 mL/min — ABNORMAL LOW (ref >60)
Glucose, Bld: 87 mg/dL (ref 70–99)
Potassium: 3.4 mmol/L — ABNORMAL LOW (ref 3.5–5.1)
Sodium: 136 mmol/L (ref 135–145)

## 2021-06-29 LAB — C-REACTIVE PROTEIN: CRP: 8.3 mg/dL — ABNORMAL HIGH (ref <1.0)

## 2021-06-29 LAB — TYPE AND SCREEN
ABO/RH(D): A POS
Antibody Screen: NEGATIVE

## 2021-06-29 LAB — PROTIME-INR
INR: 1.5 — ABNORMAL HIGH (ref 0.8–1.2)
Prothrombin Time: 18.1 seconds — ABNORMAL HIGH (ref 11.4–15.2)

## 2021-06-29 LAB — MAGNESIUM: Magnesium: 2 mg/dL (ref 1.7–2.4)

## 2021-06-29 LAB — RETICULOCYTES
Immature Retic Fract: 31.5 % — ABNORMAL HIGH (ref 2.3–15.9)
RBC.: 2.95 MIL/uL — ABNORMAL LOW (ref 3.87–5.11)
Retic Count, Absolute: 36.6 10*3/uL (ref 19.0–186.0)
Retic Ct Pct: 1.2 % (ref 0.4–3.1)

## 2021-06-29 LAB — TECHNOLOGIST SMEAR REVIEW: Plt Morphology: NORMAL

## 2021-06-29 LAB — LACTATE DEHYDROGENASE: LDH: 118 U/L (ref 98–192)

## 2021-06-29 LAB — FIBRINOGEN: Fibrinogen: 231 mg/dL (ref 210–475)

## 2021-06-29 MED ORDER — NIRMATRELVIR/RITONAVIR (PAXLOVID) TABLET (RENAL DOSING)
2.0000 | ORAL_TABLET | Freq: Two times a day (BID) | ORAL | Status: DC
  Filled 2021-06-29: qty 20

## 2021-06-29 MED ORDER — FUROSEMIDE 10 MG/ML IJ SOLN
20.0000 mg | Freq: Once | INTRAMUSCULAR | Status: AC
  Administered 2021-06-29: 20 mg via INTRAVENOUS
  Filled 2021-06-29: qty 2

## 2021-06-29 MED ORDER — ALBUMIN HUMAN 25 % IV SOLN
25.0000 g | Freq: Once | INTRAVENOUS | Status: AC
  Administered 2021-06-29: 25 g via INTRAVENOUS
  Filled 2021-06-29: qty 100

## 2021-06-29 MED ORDER — POTASSIUM CHLORIDE CRYS ER 20 MEQ PO TBCR
40.0000 meq | EXTENDED_RELEASE_TABLET | Freq: Once | ORAL | Status: AC
  Administered 2021-06-29: 40 meq via ORAL
  Filled 2021-06-29: qty 2

## 2021-06-29 NOTE — Assessment & Plan Note (Addendum)
Foley catheter was placed in the ER as patient presented with AKI. Will discontinue Foley catheter

## 2021-06-29 NOTE — Progress Notes (Signed)
[facility] Progress Note  Patient: Sally Wise    [id number]  DOA: 06/24/2021    Date of Service: the patient was seen and examined on 06/29/2021  Brief hospital course: No notes on file  Assessment and Plan: * Acute renal failure superimposed on stage 3a chronic kidney disease ([place]) Please inform creatinine around 1.1-1.2.  GFR around 40s. On presentation serum creatinine around 2.4. Received IV hydration.  No evidence of obstruction.  Also received IV albumin.  Currently renal function improving.  Monitor.  Anemia Chronic anemia as well as thrombocytopenia of unknown etiology.  Follows up with hematology. Hemoglobin dropped to 6.9 on 12/14.  Received 2 PRBC transfusion.  Hemoglobin remained stable.  No active bleeding seen.  Monitor.  Work-up including reticulocyte count, iron level, 123456 folic acid normal.  Smear negative for any schistocytes.  Thrombocytopenia ([place]) Acute on chronic.  At baseline platelet around 50s.  Follows up with hematology.  Suspecting myelodysplastic syndrome.  Currently in 73s.  Stable.  No active bleeding.  Pleural effusion Likely from third spacing. Monitor.  Incentive spirometry.  CAP (community acquired pneumonia) Chest x-ray shows evidence of related effusion with possible pneumonia most likely atelectasis. Started on IV ceftriaxone on doxycycline.  Completed p.o. antibiotics for a total of 5-day treatment course.Marland Kitchen  Hypotension Blood pressure soft secondary to third spacing. Most of the blood pressure readings are also inaccurate. When measured correctly her MAP is more than 60. Monitor for now.  Avoid antihypertensive medication.Marland Kitchen  History of COVID-19 Recently diagnosed with COVID-19 infection 2 weeks ago and treated with 'Pill'. COVID-19 test performed to facilitate transfer to SNF is positive which likely represents his recent infection. CT value also 31.9.  I do not think the patient requires isolation as  well.  Hypoalbuminemia With resultant third spacing. Multiple etiology.  At present I will give IV albumin again followed by IV Lasix to reduce third spacing.  Monitor renal function.  Failure to thrive in adult Presents with dehydration, poor p.o. intake, anorexia. Significant weight loss as well. Dietitian consulted.  Continue supplements.  Hypothyroidism TSH 9, high normal, free T4 0.7 low normal. Will increase Synthroid dose from 112-125 mcg. This would also help with improving appetite and mentation. Recommend recheck in 1 month.  Foley catheter in place Foley catheter was placed in the ER as patient presented with AKI. Will discontinue Foley catheter tomorrow.  Daytime somnolence Likely from combination of BuSpar, Zoloft as well as AKI on top of possible undiagnosed sleep apnea given her BMI. Will discontinue BuSpar.  Changed Zoloft to Remeron. Due to hallucination currently holding Remeron.  Depression Patient is on BuSpar and Zoloft. Husband at bedside does not think that this medications are helping. Initially changed to Remeron.  Due to hallucination currently holding.  Ankle fracture Left ankle fracture.  Treated with Dr. Doran Durand. Recommendation is "she's in a cam boot which can be removed for ROM in PT.  She can bear weight to a max of 75# in the boot for the next month.  She will need new films in a month."   Obesity, Class III, BMI 40-49.9 (morbid obesity) (HCC) Body mass index is 48.5 kg/m.  Placing the patient at high risk of poor outcome. Already developing deconditioning including pressure ulcers. At risk for sleep apnea as well. Monitor.  PAF (paroxysmal atrial fibrillation) (HCC) Currently not on medication for rate control. Also not on any anticoagulation secondary to severe thrombocytopenia and anemia.  Pressure injury of skin, Coccyx Mid;Lower Stage 3  Foam dressing and frequent position changing.    Body mass index is 48.5 kg/m.   Nutrition Problem: Increased nutrient needs Etiology: acute illness, wound healing Pressure Injury 06/25/21 Coccyx Mid;Lower Stage 3 -  Full thickness tissue loss. Subcutaneous fat may be visible but bone, tendon or muscle are NOT exposed. 1.5 cm by 1 cm (Active)  06/25/21 0130  Location: Coccyx  Location Orientation: Mid;Lower  Staging: Stage 3 -  Full thickness tissue loss. Subcutaneous fat may be visible but bone, tendon or muscle are NOT exposed.  Wound Description (Comments): 1.5 cm by 1 cm  Present on Admission: Yes     Pressure Injury Pretibial Distal;Left Unstageable - Full thickness tissue loss in which the base of the injury is covered by slough (yellow, tan, gray, green or brown) and/or eschar (tan, brown or black) in the wound bed. 2 cm x 1 cm (Active)     Location: Pretibial  Location Orientation: Distal;Left  Staging: Unstageable - Full thickness tissue loss in which the base of the injury is covered by slough (yellow, tan, gray, green or brown) and/or eschar (tan, brown or black) in the wound bed.  Wound Description (Comments): 2 cm x 1 cm  Present on Admission: Yes     Subjective: No nausea no vomiting.  No fever no chills.  Reported hallucination visual.  Objective: Vital signs were reviewed and unremarkable.  Exam: General: Appear in mild distress, no Rash; Oral Mucosa Clear, moist. no Abnormal Neck Mass Or lumps, Conjunctiva normal  Cardiovascular: S1 and S2 Present, no Murmur, Respiratory: good respiratory effort, Bilateral Air entry present and CTA, no Crackles, no wheezes Abdomen: Bowel Sound present, Soft and no tenderness Extremities: Generalized anasarca, edema Neurology: alert and oriented to time, place, and person affect appropriate. no new focal deficit Gait not checked due to patient safety concerns    Data Reviewed: My review of labs, imaging, notes and other tests shows no new significant findings.   Disposition:  Status is: Inpatient  Remains  inpatient appropriate because: Unsafe discharge, currently pending insurance authorization.  Treating with IV Lasix and IV albumin.    Family Communication: Husband at bedside.  DVT Prophylaxis: SCDs Start: 06/24/21 2238   Time spent: 35 minutes.   Author: Lynden Oxford  06/29/2021 7:25 PM  To reach On-call, see care teams to locate the attending and reach out via [email or web address]. Between 7PM-7AM, please contact night-coverage If you still have difficulty reaching the attending provider, please page the [facility] (Director on Call) for [facility] on amion for assistance.

## 2021-06-29 NOTE — Assessment & Plan Note (Addendum)
Recently diagnosed with COVID-19 infection 2 weeks ago and treated with 'Pill'. COVID-19 test performed to facilitate transfer to SNF is positive.  CT value also 31.9.   Patient does not have new acute COVID-19 infection. I do not think the patient requires isolation as well.

## 2021-06-30 DIAGNOSIS — N1831 Chronic kidney disease, stage 3a: Secondary | ICD-10-CM | POA: Diagnosis not present

## 2021-06-30 DIAGNOSIS — N179 Acute kidney failure, unspecified: Secondary | ICD-10-CM | POA: Diagnosis not present

## 2021-06-30 LAB — CBC WITH DIFFERENTIAL/PLATELET
Abs Immature Granulocytes: 0.02 10*3/uL (ref 0.00–0.07)
Basophils Absolute: 0 10*3/uL (ref 0.0–0.1)
Basophils Relative: 0 %
Eosinophils Absolute: 0.3 10*3/uL (ref 0.0–0.5)
Eosinophils Relative: 5 %
HCT: 26.6 % — ABNORMAL LOW (ref 36.0–46.0)
Hemoglobin: 8.3 g/dL — ABNORMAL LOW (ref 12.0–15.0)
Immature Granulocytes: 0 %
Lymphocytes Relative: 43 %
Lymphs Abs: 2.2 10*3/uL (ref 0.7–4.0)
MCH: 29.5 pg (ref 26.0–34.0)
MCHC: 31.2 g/dL (ref 30.0–36.0)
MCV: 94.7 fL (ref 80.0–100.0)
Monocytes Absolute: 0.9 10*3/uL (ref 0.1–1.0)
Monocytes Relative: 18 %
Neutro Abs: 1.7 10*3/uL (ref 1.7–7.7)
Neutrophils Relative %: 34 %
Platelets: 30 10*3/uL — ABNORMAL LOW (ref 150–400)
RBC: 2.81 MIL/uL — ABNORMAL LOW (ref 3.87–5.11)
RDW: 20.8 % — ABNORMAL HIGH (ref 11.5–15.5)
WBC: 5.1 10*3/uL (ref 4.0–10.5)
nRBC: 0.4 % — ABNORMAL HIGH (ref 0.0–0.2)

## 2021-06-30 LAB — BASIC METABOLIC PANEL
Anion gap: 7 (ref 5–15)
BUN: 52 mg/dL — ABNORMAL HIGH (ref 8–23)
CO2: 22 mmol/L (ref 22–32)
Calcium: 7.9 mg/dL — ABNORMAL LOW (ref 8.9–10.3)
Chloride: 110 mmol/L (ref 98–111)
Creatinine, Ser: 1.31 mg/dL — ABNORMAL HIGH (ref 0.44–1.00)
GFR, Estimated: 42 mL/min — ABNORMAL LOW (ref >60)
Glucose, Bld: 87 mg/dL (ref 70–99)
Potassium: 3.5 mmol/L (ref 3.5–5.1)
Sodium: 139 mmol/L (ref 135–145)

## 2021-06-30 LAB — MAGNESIUM: Magnesium: 2 mg/dL (ref 1.7–2.4)

## 2021-06-30 MED ORDER — POTASSIUM CHLORIDE CRYS ER 20 MEQ PO TBCR
20.0000 meq | EXTENDED_RELEASE_TABLET | Freq: Once | ORAL | Status: AC
  Administered 2021-06-30: 09:00:00 20 meq via ORAL
  Filled 2021-06-30: qty 1

## 2021-06-30 NOTE — Progress Notes (Signed)
[facility] Progress Note  Patient: Sally Wise    [id number]  DOA: 06/24/2021    Date of Service: the patient was seen and examined on 06/30/2021  Brief hospital course: 12/12 presents with generalized weakness found to have AKI. 12/13 orthopedic consulted.  1 PRBC transfused. 12/16 medically stable to transfer to SNF although COVID test came back positive.  Reportedly had COVID 2 weeks ago.   Had hallucination on 12/16 as well as 12/17.  Likely due to Remeron. 12/18 remains medically stable.  Assessment and Plan: * Acute renal failure superimposed on stage 3a chronic kidney disease (HCC) Baseline creatinine around 1.1-1.2.  GFR around 40s. On presentation serum creatinine around 2.4. Received IV hydration.  No evidence of obstruction.  Also received IV albumin.  Currently renal function improving.  Monitor.  Anemia Chronic anemia as well as thrombocytopenia of unknown etiology.  Follows up with hematology. Hemoglobin dropped to 6.9 on 12/14.  Received 2 PRBC transfusion.  Hemoglobin remained stable.  No active bleeding seen.  Monitor.  Work-up including reticulocyte count, iron level, 123456 folic acid normal.  Smear negative for any schistocytes.  Thrombocytopenia ([place]) Acute on chronic.  At baseline platelet around 50s.  Follows up with hematology.  Suspecting myelodysplastic syndrome.  Currently in 72s.  Stable.  No active bleeding.  Pleural effusion Likely from third spacing. Monitor.  Incentive spirometry.  CAP (community acquired pneumonia) Chest x-ray shows evidence of related effusion with possible pneumonia most likely atelectasis. Started on IV ceftriaxone on doxycycline.  Completed p.o. antibiotics for a total of 5-day treatment course.  Last day 12/18.  Hypotension Blood pressure soft secondary to third spacing. Most of the blood pressure readings are also inaccurate. When measured correctly her MAP is more than 60. Monitor for now.  Avoid  antihypertensive medication.Marland Kitchen  History of COVID-19 Recently diagnosed with COVID-19 infection 2 weeks ago and treated with 'Pill'. COVID-19 test performed to facilitate transfer to SNF is positive.  CT value also 31.9.   Patient does not have new acute COVID-19 infection. I do not think the patient requires isolation as well.  Unable to perform the test with the facility therefore we will continue isolation for now.  Hypoalbuminemia With resultant third spacing. Multiple etiology.  Treated with IV albumin followed by IV Lasix.  Recommend to improve p.o. intake.  Failure to thrive in adult Presents with dehydration, poor p.o. intake, anorexia. Significant weight loss as well. Dietitian consulted.  Continue supplements.  Hypothyroidism TSH 9, high normal, free T4 0.7 low normal. Will increase Synthroid dose from 112-125 mcg. This would also help with improving appetite and mentation. Recommend recheck in 1 month.  Foley catheter in place Foley catheter was placed in the ER as patient presented with AKI. Will discontinue Foley catheter  Daytime somnolence Likely from combination of BuSpar, Zoloft as well as AKI on top of possible undiagnosed sleep apnea given her BMI. Will discontinue BuSpar.  Changed Zoloft to Remeron. Due to hallucination currently holding Remeron.  Depression Patient is on BuSpar and Zoloft. Husband at bedside does not think that this medications are helping. Initially changed to Remeron.  Due to hallucination currently holding.  Hallucination appears to have resolved.  Ankle fracture Left ankle fracture.  Treated with Dr. Doran Durand. Recommendation is "she's in a cam boot which can be removed for ROM in PT.  She can bear weight to a max of 75# in the boot for the next month.  She will need new films in a month."  Obesity, Class III, BMI 40-49.9 (morbid obesity) (HCC) Body mass index is 48.5 kg/m.  Placing the patient at high risk of poor  outcome. Already developing deconditioning including pressure ulcers. At risk for sleep apnea as well. Monitor.  PAF (paroxysmal atrial fibrillation) (HCC) Currently not on medication for rate control. Also not on any anticoagulation secondary to severe thrombocytopenia and anemia.  Pressure injury of skin, Coccyx Mid;Lower Stage 3 Foam dressing and frequent position changing.    Body mass index is 48.5 kg/m.  Nutrition Problem: Increased nutrient needs Etiology: acute illness, wound healing Pressure Injury 06/25/21 Coccyx Mid;Lower Stage 3 -  Full thickness tissue loss. Subcutaneous fat may be visible but bone, tendon or muscle are NOT exposed. 1.5 cm by 1 cm (Active)  06/25/21 0130  Location: Coccyx  Location Orientation: Mid;Lower  Staging: Stage 3 -  Full thickness tissue loss. Subcutaneous fat may be visible but bone, tendon or muscle are NOT exposed.  Wound Description (Comments): 1.5 cm by 1 cm  Present on Admission: Yes     Pressure Injury Pretibial Distal;Left Unstageable - Full thickness tissue loss in which the base of the injury is covered by slough (yellow, tan, gray, green or brown) and/or eschar (tan, brown or black) in the wound bed. 2 cm x 1 cm (Active)     Location: Pretibial  Location Orientation: Distal;Left  Staging: Unstageable - Full thickness tissue loss in which the base of the injury is covered by slough (yellow, tan, gray, green or brown) and/or eschar (tan, brown or black) in the wound bed.  Wound Description (Comments): 2 cm x 1 cm  Present on Admission: Yes     Subjective: No acute complaint no nausea no vomiting no fever no chills.  No further hallucination.  Objective: Vital signs were reviewed and unremarkable.  Exam: General: Appear in mild distress, no Rash; Oral Mucosa Clear, moist. no Abnormal Neck Mass Or lumps, Conjunctiva normal  Cardiovascular: S1 and S2 Present, no Murmur, Respiratory: good respiratory effort, Bilateral Air entry  present and CTA, no Crackles, no wheezes Abdomen: Bowel Sound present, Soft and no tenderness Extremities: Generalized anasarca, generalized pedal edema Neurology: alert and oriented to time, place, and person affect appropriate. no new focal deficit Gait not checked due to patient safety concerns    Data Reviewed: My review of labs, imaging, notes and other tests shows no new significant findings.   Disposition:  Status is: Inpatient  Remains inpatient appropriate because: Awaiting insurance authorization, medically stable.  Family Communication: No one at bedside.  DVT Prophylaxis: SCDs Start: 06/24/21 2238   Time spent: 35 minutes.   Author: Lynden Oxford  06/30/2021 6:40 PM  To reach On-call, see care teams to locate the attending and reach out via [email or web address]. Between 7PM-7AM, please contact night-coverage If you still have difficulty reaching the attending provider, please page the [facility] (Director on Call) for [facility] on amion for assistance.

## 2021-06-30 NOTE — Hospital Course (Signed)
12/12 presents with generalized weakness found to have AKI. 12/13 orthopedic consulted.  1 PRBC transfused. 12/16 medically stable to transfer to SNF although COVID test came back positive.  Reportedly had COVID 2 weeks ago.   Had hallucination on 12/16 as well as 12/17.  Likely due to Remeron. 12/18 remains medically stable.

## 2021-06-30 NOTE — Plan of Care (Signed)
°  Problem: Clinical Measurements: Goal: Ability to maintain clinical measurements within normal limits will improve Outcome: Progressing Goal: Will remain free from infection Outcome: Progressing Goal: Diagnostic test results will improve Outcome: Progressing Goal: Respiratory complications will improve Outcome: Progressing   Problem: Activity: Goal: Risk for activity intolerance will decrease Outcome: Progressing   Problem: Coping: Goal: Level of anxiety will decrease Outcome: Progressing   Problem: Elimination: Goal: Will not experience complications related to bowel motility Outcome: Progressing Goal: Will not experience complications related to urinary retention Outcome: Progressing   Problem: Pain Managment: Goal: General experience of comfort will improve Outcome: Progressing   Problem: Safety: Goal: Ability to remain free from injury will improve Outcome: Progressing   Problem: Education: Goal: Knowledge of General Education information will improve Description: Including pain rating scale, medication(s)/side effects and non-pharmacologic comfort measures Outcome: Not Progressing   Problem: Health Behavior/Discharge Planning: Goal: Ability to manage health-related needs will improve Outcome: Not Progressing   Problem: Clinical Measurements: Goal: Cardiovascular complication will be avoided Outcome: Not Progressing   Problem: Nutrition: Goal: Adequate nutrition will be maintained Outcome: Not Progressing   Problem: Skin Integrity: Goal: Risk for impaired skin integrity will decrease Outcome: Not Progressing   Problem: Education: Goal: Knowledge of General Education information will improve Description: Including pain rating scale, medication(s)/side effects and non-pharmacologic comfort measures Outcome: Not Progressing   Problem: Health Behavior/Discharge Planning: Goal: Ability to manage health-related needs will improve Outcome: Not Progressing

## 2021-06-30 NOTE — Progress Notes (Signed)
Occupational Therapy Treatment Patient Details Name: Sally Wise MRN: [id number] DOB: 1946/05/14 Today's Date: 06/30/2021   History of present illness 75 year old female with PMH: paroxysmal afib, hypothyroidism, anemia/thrombocytopenia, depression/anxiety, morbid obesity, recent left ankle fracture s/p ORIF 05/07/2021 who presented to [facility] ED via EMS from her orthopedists outpatient office on 12/12 with progressive weakness and fatigue.  Since discharge from her ankle fracture, patient has been in a SNF [facility] then [facility]  functionally declining since ankle fx   OT comments  Patient able to sit edge of bed with posterior back support and guarding from the front for a total of 7 minutes. She was able to brush teeth and wipe her mouth. She prefers to prop herself with her upper extremities reporting taking away her arms "makes it uncomfortable." She was able to kick her legs at edge of bed. She was returned to supine with total assist. Patient encouraged to to move arms and legs during commercial break to promote strengthening. Today patient confused in regards to date - thinking she just transferred from the nursing home and that she had Therapy "yesterday."    Recommendations for follow up therapy are one component of a multi-disciplinary discharge planning process, led by the attending physician.  Recommendations may be updated based on patient status, additional functional criteria and insurance authorization.    Follow Up Recommendations  Skilled nursing-short term rehab (<3 hours/day)    Assistance Recommended at Discharge Frequent or constant Supervision/Assistance  Equipment Recommendations  None recommended by OT    Recommendations for Other Services      Precautions / Restrictions Precautions Precautions: Fall Required Braces or Orthoses: Other Brace Other Brace: camboot on L Restrictions Weight Bearing Restrictions: Yes LLE Partial Weight Bearing Percentage or  Pounds: 75       Mobility Bed Mobility                    Transfers                         Balance                                           ADL either performed or assessed with clinical judgement   ADL       Grooming: Oral care Grooming Details (indicate cue type and reason): sat at edge of bed with posterior support to brush teeth. Patient performed task with less than stellar quality but averaged approx 7 minutes at side of bed.                                    Extremity/Trunk Assessment              Vision   Vision Assessment?: No apparent visual deficits   Perception     Praxis      Cognition Arousal/Alertness: Awake/alert Behavior During Therapy: WFL for tasks assessed/performed Overall Cognitive Status: Impaired/Different from baseline                                 General Comments: appears to have some memory deficits and some difficulty remembering time line of recent events. THinks it is Thursday and she just transferred from  Nursing home.          Exercises Other Exercises Other Exercises: Knee extension x 5 each leg at edge of bed. encouarged LE and UE movement while in bed. Minimal reps per every commercial break.   Shoulder Instructions       General Comments      Pertinent Vitals/ Pain       Pain Assessment: No/denies pain  Home Living                                          Prior Functioning/Environment              Frequency  Min 1X/week        Progress Toward Goals  OT Goals(current goals can now be found in the care plan section)  Progress towards OT goals: Progressing toward goals  Acute Rehab OT Goals Patient Stated Goal: walk again OT Goal Formulation: With patient Time For Goal Achievement: 07/09/21 Potential to Achieve Goals: Fair  Plan Discharge plan remains appropriate    Co-evaluation                  AM-PAC OT "6 Clicks" Daily Activity     Outcome Measure   Help from another person eating meals?: A Little Help from another person taking care of personal grooming?: A Little Help from another person toileting, which includes using toliet, bedpan, or urinal?: A Lot Help from another person bathing (including washing, rinsing, drying)?: A Lot Help from another person to put on and taking off regular upper body clothing?: A Lot Help from another person to put on and taking off regular lower body clothing?: A Lot 6 Click Score: 14    End of Session    OT Visit Diagnosis: History of falling (Z91.81);Muscle weakness (generalized) (M62.81);Pain   Activity Tolerance Patient limited by fatigue   Patient Left in bed;with call bell/phone within reach   Nurse Communication Mobility status        Time: 7482-7078 OT Time Calculation (min): 21 min  Charges: OT General Charges $OT Visit: 1 Visit OT Treatments $Self Care/Home Management : 8-22 mins  Waldron Session, OTR/L Acute Care Rehab Services  Office [phone] Pager: [phone]   Kelli Churn 06/30/2021, 2:19 PM

## 2021-06-30 NOTE — Progress Notes (Signed)
Patient voided 200 mL clear yellow urine at 1230 (Foley catheter removed at 0630).  Bradd Burner, RN

## 2021-07-01 DIAGNOSIS — N1831 Chronic kidney disease, stage 3a: Secondary | ICD-10-CM | POA: Diagnosis not present

## 2021-07-01 DIAGNOSIS — N179 Acute kidney failure, unspecified: Secondary | ICD-10-CM | POA: Diagnosis not present

## 2021-07-01 MED ORDER — FERROUS SULFATE 325 (65 FE) MG PO TABS
325.0000 mg | ORAL_TABLET | Freq: Every day | ORAL | Status: DC
  Administered 2021-07-01: 12:00:00 325 mg via ORAL
  Filled 2021-07-01: qty 1

## 2021-07-01 NOTE — Progress Notes (Signed)
Patient picked up by [facility] for transport to [facility]. All belongings given to [facility] staff. Daughter Natalia Leatherwood informed that patient has been picked up. Patient is in stable condition.

## 2021-07-01 NOTE — Progress Notes (Signed)
Occupational Therapy Treatment Patient Details Name: Sally Wise MRN: [id number] DOB: Nov 24, 1945 Today's Date: 07/01/2021   History of present illness 75 year old female with PMH: paroxysmal afib, hypothyroidism, anemia/thrombocytopenia, depression/anxiety, morbid obesity, recent left ankle fracture s/p ORIF 05/07/2021 who presented to [facility] ED via EMS from her orthopedists outpatient office on 12/12 with progressive weakness and fatigue.  Since discharge from her ankle fracture, patient has been in a SNF [facility], then [facility])  functionally declining since ankle fx   OT comments  Pt able to roll in bed using rails mod A, declined attempting to sit EOB for functional tasks. Pt agreeable to bed level UE exercises. Pt reports that she may d/c to SNF later today   Recommendations for follow up therapy are one component of a multi-disciplinary discharge planning process, led by the attending physician.  Recommendations may be updated based on patient status, additional functional criteria and insurance authorization.    Follow Up Recommendations  Skilled nursing-short term rehab (<3 hours/day)    Assistance Recommended at Discharge Frequent or constant Supervision/Assistance  Equipment Recommendations  Other (comment) (TBD at SNF)    Recommendations for Other Services      Precautions / Restrictions Precautions Precautions: Fall Required Braces or Orthoses: Other Brace Other Brace: camboot on L Restrictions Weight Bearing Restrictions: Yes LLE Weight Bearing: Partial weight bearing LLE Partial Weight Bearing Percentage or Pounds: 75 pounds       Mobility Bed Mobility Overal bed mobility: Needs Assistance Bed Mobility: Rolling Rolling: Mod assist         General bed mobility comments: pt declined attempting to sit EOB    Transfers                         Balance                                           ADL either performed or  assessed with clinical judgement   ADL                                              Extremity/Trunk Assessment Upper Extremity Assessment Upper Extremity Assessment: Generalized weakness;LUE deficits/detail;RUE deficits/detail RUE Deficits / Details: patient was noted to have significant edema on UE with patient still able to complete full range even with edema levels. Able tolerate A/AAROM LUE Deficits / Details: patient was noted to have significant edema on UE with patient still able to complete full range even with edema levels. Able tolerate A/AAROM   Lower Extremity Assessment Lower Extremity Assessment: Defer to PT evaluation   Cervical / Trunk Assessment Cervical / Trunk Assessment: Other exceptions Cervical / Trunk Exceptions: large body habitus    Vision Baseline Vision/History: 1 Wears glasses Ability to See in Adequate Light: 0 Adequate Patient Visual Report: No change from baseline     Perception     Praxis      Cognition Arousal/Alertness: Awake/alert Behavior During Therapy: WFL for tasks assessed/performed Overall Cognitive Status: No family/caregiver present to determine baseline cognitive functioning                                 General Comments: AxO  x 3 but with slight delayed responce verbally and physically. Pt reports she feels "fine" but is c/o her hands "don't work" like they did.          Exercises Other Exercises Other Exercises: 10 reps AP and circles  10 reps B knee presses  10 reps, 2 seta for B shoulder flexion, ABD, ER, IR, elbow flexion/extension   Shoulder Instructions       General Comments      Pertinent Vitals/ Pain       Pain Assessment: Faces Faces Pain Scale: Hurts a little bit Pain Location: diffuse, pain with movement Pain Descriptors / Indicators: Grimacing Pain Intervention(s): Limited activity within patient's tolerance;Monitored during session;Repositioned  Home Living                                           Prior Functioning/Environment              Frequency  Min 1X/week        Progress Toward Goals  OT Goals(current goals can now be found in the care plan section)  Progress towards OT goals: OT to reassess next treatment     Plan Discharge plan remains appropriate;Frequency remains appropriate    Co-evaluation                 AM-PAC OT "6 Clicks" Daily Activity     Outcome Measure   Help from another person eating meals?: None Help from another person taking care of personal grooming?: A Little Help from another person toileting, which includes using toliet, bedpan, or urinal?: A Lot Help from another person bathing (including washing, rinsing, drying)?: A Lot Help from another person to put on and taking off regular upper body clothing?: A Lot Help from another person to put on and taking off regular lower body clothing?: A Lot 6 Click Score: 15    End of Session    OT Visit Diagnosis: History of falling (Z91.81);Muscle weakness (generalized) (M62.81);Pain   Activity Tolerance     Patient Left     Nurse Communication          Time: [id number] OT Time Calculation (min): 14 min  Charges: OT General Charges $OT Visit: 1 Visit OT Treatments $Therapeutic Activity: 8-22 mins    Britt Bottom 07/01/2021, 1:09 PM

## 2021-07-01 NOTE — Plan of Care (Signed)
  Problem: Clinical Measurements: Goal: Respiratory complications will improve Outcome: Adequate for Discharge   Problem: Clinical Measurements: Goal: Cardiovascular complication will be avoided Outcome: Adequate for Discharge   

## 2021-07-01 NOTE — TOC Progression Note (Signed)
Transition of Care [facility]) - Progression Note    Patient Details  Name: Sally Wise MRN: [id number] Date of Birth: [birth year]  Transition of Care [facility]) CM/SW Contact  Darleene Cleaver, [place] Phone Number: 07/01/2021, 12:48 PM  Clinical Narrative:     CSW has received insurance authorization for patient to go to [facility].  Reference number [id number], next review 12/21.  CSW spoke to [place] at [facility] and patient can come to SNF today.   Expected Discharge Plan: Skilled Nursing Facility Barriers to Discharge: Continued Medical Work up  Expected Discharge Plan and Services Expected Discharge Plan: Skilled Nursing Facility   Discharge Planning Services: CM Consult   Living arrangements for the past 2 months: Skilled Nursing Facility                                       Social Determinants of Health (SDOH) Interventions    Readmission Risk Interventions Readmission Risk Prevention Plan 06/08/2020  Post Dischage Appt Complete  Medication Screening Complete  Transportation Screening Complete  Some recent data might be hidden

## 2021-07-01 NOTE — TOC Transition Note (Signed)
Transition of Care [facility]) - CM/SW Discharge Note   Patient Details  Name: Sally Wise MRN: [id number] Date of Birth: 21-Jan-1946  Transition of Care [facility]) CM/SW Contact:  Darleene Cleaver, LCSW Phone Number: 07/01/2021, 1:02 PM   Clinical Narrative:     Patient to be d/c'ed today to [facility] SNF room 102. Patient and family agreeable to plans will transport via ems RN to call report to [phone].  Patient's husband was made aware that patient is discharging today.       Final next level of care: Skilled Nursing Facility Barriers to Discharge: Barriers Resolved   Patient Goals and CMS Choice Patient states their goals for this hospitalization and ongoing recovery are:: Patient plans to go to [facility] to continue with rehab. CMS Medicare.gov Compare Post Acute Care list provided to:: Patient Represenative (must comment) Choice offered to / list presented to : Spouse  Discharge Placement   Existing PASRR number confirmed : 06/27/21          Patient chooses bed at: [facility] Patient to be transferred to facility by: [facility] Name of family member notified: Husband Sally Wise Patient and family notified of of transfer: 07/01/21  Discharge Plan and Services   Discharge Planning Services: CM Consult                                 Social Determinants of Health (SDOH) Interventions     Readmission Risk Interventions Readmission Risk Prevention Plan 06/08/2020  Post Dischage Appt Complete  Medication Screening Complete  Transportation Screening Complete  Some recent data might be hidden

## 2021-07-01 NOTE — Progress Notes (Signed)
Physical Therapy Treatment Patient Details Name: Sally Wise MRN: [id number] DOB: 28-Dec-1945 Today's Date: 07/01/2021   History of Present Illness 75 year old female with PMH: paroxysmal afib, hypothyroidism, anemia/thrombocytopenia, depression/anxiety, morbid obesity, recent left ankle fracture s/p ORIF 05/07/2021 who presented to [facility] ED via EMS from her orthopedists outpatient office on 12/12 with progressive weakness and fatigue.  Since discharge from her ankle fracture, patient has been in a SNF [facility], then [facility])  functionally declining since ankle fx    PT Comments    Pt is familiar from prior admit.  General Comments: AxO x 3 but with slight delayed responce verbally and physically. Pt reports she feels "fine" but is c/o her hands "don't work" like they did.  Pt was unable to attempt sitting EOB due to weakness so performed bed level TE's. Pt will need to return to SNF level of care.   Recommendations for follow up therapy are one component of a multi-disciplinary discharge planning process, led by the attending physician.  Recommendations may be updated based on patient status, additional functional criteria and insurance authorization.  Follow Up Recommendations  Skilled nursing-short term rehab (<3 hours/day)     Assistance Recommended at Discharge Frequent or constant Supervision/Assistance  Equipment Recommendations  None recommended by PT    Recommendations for Other Services       Precautions / Restrictions Precautions Precautions: Fall Required Braces or Orthoses: Other Brace Other Brace: camboot on L Restrictions Weight Bearing Restrictions: Yes LLE Weight Bearing: Partial weight bearing LLE Partial Weight Bearing Percentage or Pounds: 75 pounds           Cognition Arousal/Alertness: Awake/alert Behavior During Therapy: WFL for tasks assessed/performed Overall Cognitive Status: Impaired/Different from baseline                                  General Comments: AxO x 3 but with slight delayed responce verbally and physically. Pt reports she feels "fine" but is c/o her hands "don't work" like they did.        Exercises  10 reps AP and circles 10 reps B knee presses 10 reps AAROM HS  10 reps B LE ADduction 10 reps B LE ABduction     General Comments        Pertinent Vitals/Pain Pain Assessment: Faces Faces Pain Scale: Hurts a little bit Pain Location: diffuse, pain with movement Pain Descriptors / Indicators: Grimacing Pain Intervention(s): Limited activity within patient's tolerance    Home Living                          Prior Function            PT Goals (current goals can now be found in the care plan section) Progress towards PT goals: Progressing toward goals    Frequency    Min 2X/week      PT Plan Current plan remains appropriate    Co-evaluation              AM-PAC PT "6 Clicks" Mobility   Outcome Measure  Help needed turning from your back to your side while in a flat bed without using bedrails?: Total Help needed moving from lying on your back to sitting on the side of a flat bed without using bedrails?: Total Help needed moving to and from a bed to a chair (including a wheelchair)?: Total Help needed standing up  from a chair using your arms (e.g., wheelchair or bedside chair)?: Total Help needed to walk in hospital room?: Total Help needed climbing 3-5 steps with a railing? : Total 6 Click Score: 6    End of Session   Activity Tolerance: Patient tolerated treatment well Patient left: in bed;with call bell/phone within reach;with bed alarm set;with family/visitor present Nurse Communication: Mobility status;Need for lift equipment PT Visit Diagnosis: Muscle weakness (generalized) (M62.81);Other abnormalities of gait and mobility (R26.89)     Time: 0375-4360 PT Time Calculation (min) (ACUTE ONLY): 19 min  Charges:  $Therapeutic Exercise: 8-22  mins                     {Arrionna Serena  PTA Acute  Rehabilitation Services Pager      [phone] Office      [phone]

## 2021-07-01 NOTE — Discharge Summary (Signed)
Physician Discharge Summary   Patient name: Sally Wise  Admit date:     06/24/2021  Discharge date: 07/01/2021  Discharge Physician: Berle Mull   PCP: [name], MD   Recommendations at discharge:  follow up with PCP, Oncology and Orthopedics as recommended.  Recheck CBC in 1 week  Discharge Diagnoses Principal Problem:   Acute renal failure superimposed on stage 3a chronic kidney disease (HCC) Active Problems:   Thrombocytopenia (HCC)   Anemia   Hypotension   CAP (community acquired pneumonia)   Pleural effusion   Hypothyroidism   Failure to thrive in adult   Hypoalbuminemia   History of COVID-19   PAF (paroxysmal atrial fibrillation) (HCC)   Obesity, Class III, BMI 40-49.9 (morbid obesity) ([place])   Ankle fracture   Depression   Daytime somnolence   Foley catheter in place   Pressure injury of skin, Coccyx Mid;Lower Stage 3  Hospital Course   12/12 presents with generalized weakness found to have AKI. 12/13 orthopedic consulted.  1 PRBC transfused. 12/16 medically stable to transfer to SNF although COVID test came back positive.  Reportedly had COVID 2 weeks ago.   Had hallucination on 12/16 as well as 12/17.  Likely due to Remeron. 12/18 remains medically stable.   * Acute renal failure superimposed on stage 3a chronic kidney disease ([place]) Baseline creatinine around 1.1-1.2.  GFR around 40s. On presentation serum creatinine around 2.4. Received IV hydration.  No evidence of obstruction.  Also received IV albumin.  Currently renal function improving.  Monitor.  Anemia Chronic anemia as well as thrombocytopenia of unknown etiology.  Follows up with hematology. Hemoglobin dropped to 6.9 on 12/14.  Received 2 PRBC transfusion.  Hemoglobin remained stable.  No active bleeding seen.  Monitor.  Work-up including reticulocyte count, iron level, [id number] folic acid normal.  Smear negative for any schistocytes.  Thrombocytopenia ([place]) Acute on chronic.  At  baseline platelet around 50s.  Follows up with hematology.  Suspecting myelodysplastic syndrome.  Currently in 72s.  Stable.  No active bleeding.  Pleural effusion Likely from third spacing. Monitor.  Incentive spirometry.  CAP (community acquired pneumonia) Chest x-ray shows evidence of related effusion with possible pneumonia most likely atelectasis. Started on IV ceftriaxone on doxycycline.  Completed p.o. antibiotics for a total of 5-day treatment course.  Last day 12/18.  Hypotension Blood pressure soft secondary to third spacing. Most of the blood pressure readings are also inaccurate. When measured correctly her MAP is more than 60. Monitor for now.  Avoid antihypertensive medication.Marland Kitchen  History of COVID-19 Recently diagnosed with COVID-19 infection 2 weeks ago and treated with 'Pill'. COVID-19 test performed to facilitate transfer to SNF is positive.  CT value also 31.9.   Patient does not have new acute COVID-19 infection. I do not think the patient requires isolation as well.   Hypoalbuminemia With resultant third spacing. Multiple etiology.  Treated with IV albumin followed by IV Lasix.  Recommend to improve p.o. intake.  Body mass index is 48.33 kg/m.  Nutrition Problem: Increased nutrient needs Etiology: acute illness, wound healing Nutrition Interventions: Interventions: Juven, Prostat, Ensure Enlive (each supplement provides 350kcal and 20 grams of protein)   Failure to thrive in adult Presents with dehydration, poor p.o. intake, anorexia. Significant weight loss as well. Dietitian consulted.  Continue supplements.  Hypothyroidism TSH 9, high normal, free T4 0.7 low normal. Will increase Synthroid dose from 112-125 mcg. This would also help with improving appetite and mentation. Recommend recheck in 1  month.  Foley catheter in place Foley catheter was placed in the ER as patient presented with AKI. Will discontinue Foley catheter  Daytime  somnolence Likely from combination of BuSpar, Zoloft as well as AKI on top of possible undiagnosed sleep apnea given her BMI. Will discontinue BuSpar.  Changed Zoloft to Remeron. Due to hallucination currently holding Remeron.  Depression Patient is on BuSpar and Zoloft. Husband at bedside does not think that this medications are helping. Initially changed to Remeron.  Due to hallucination currently holding.  Hallucination appears to have resolved.  Ankle fracture Left ankle fracture.  Treated with Dr. Doran Durand. Recommendation is "she's in a cam boot which can be removed for ROM in PT.  She can bear weight to a max of 75# in the boot for the next month.  She will need new films in a month."   Obesity, Class III, BMI 40-49.9 (morbid obesity) (HCC) Body mass index is 48.33 kg/m.  Placing the patient at high risk of poor outcome. Already developing deconditioning including pressure ulcers. At risk for sleep apnea as well. Monitor.  PAF (paroxysmal atrial fibrillation) (HCC) Currently not on medication for rate control. Also not on any anticoagulation secondary to severe thrombocytopenia and anemia.  Pressure injury of skin, Coccyx Mid;Lower Stage 3 Foam dressing and frequent position changing. Pressure Injury 06/25/21 Coccyx Mid;Lower Stage 3 -  Full thickness tissue loss. Subcutaneous fat may be visible but bone, tendon or muscle are NOT exposed. 1.5 cm by 1 cm (Active)  06/25/21 0130  Location: Coccyx  Location Orientation: Mid;Lower  Staging: Stage 3 -  Full thickness tissue loss. Subcutaneous fat may be visible but bone, tendon or muscle are NOT exposed.  Wound Description (Comments): 1.5 cm by 1 cm  Present on Admission: Yes     Pressure Injury Pretibial Distal;Left Unstageable - Full thickness tissue loss in which the base of the injury is covered by slough (yellow, tan, gray, green or brown) and/or eschar (tan, brown or black) in the wound bed. 2 cm x 1 cm (Active)      Location: Pretibial  Location Orientation: Distal;Left  Staging: Unstageable - Full thickness tissue loss in which the base of the injury is covered by slough (yellow, tan, gray, green or brown) and/or eschar (tan, brown or black) in the wound bed.  Wound Description (Comments): 2 cm x 1 cm  Present on Admission: Yes      Procedures performed: none   Condition at discharge: stable  Exam General: Appear in mild distress, no Rash; Oral Mucosa Clear, moist. no Abnormal Neck Mass Or lumps, Conjunctiva normal  Cardiovascular: S1 and S2 Present, no Murmur, Respiratory: good respiratory effort, Bilateral Air entry present and CTA, no Crackles, no wheezes Abdomen: Bowel Sound present, Soft and no tenderness Extremities: diffuse generalized edema, more on upper extremities Neurology: alert and oriented to time, place, and person affect appropriate. no new focal deficit Gait not checked due to patient safety concerns    Disposition: Skilled nursing facility  Discharge time: greater than 30 minutes.  Contact information for follow-up providers     [name], MD. Schedule an appointment as soon as possible for a visit in 1 week(s).   Specialty: Family Medicine Why: with CBC and BMP Contact information: [address] [place] [address] [phone]         Wylene Simmer, MD. Schedule an appointment as soon as possible for a visit in 2 week(s).   Specialty: Orthopedic Surgery Contact information:  [address] [place] [address] [phone]         Wyatt Portela, MD. Schedule an appointment as soon as possible for a visit in 1 week(s).   Specialty: Oncology Why: with CBC and BMP Contact information: [address] [place] [address] [phone]              Contact information for after-discharge care     Destination     [facility] .   Service: Skilled Nursing Contact  information: [address] [place] [phone]                     Allergies as of 07/01/2021   No Known Allergies      Medication List     STOP taking these medications    aspirin EC 81 MG tablet   busPIRone 15 MG tablet Commonly known as: BUSPAR   diltiazem 180 MG 24 hr capsule Commonly known as: CARDIZEM CD   furosemide 20 MG tablet Commonly known as: LASIX   melatonin 3 MG Tabs tablet   sertraline 50 MG tablet Commonly known as: ZOLOFT       TAKE these medications    (feeding supplement) PROSource Plus liquid Take 30 mLs by mouth 2 (two) times daily between meals.   feeding supplement Liqd Take 237 mLs by mouth 2 (two) times daily between meals.   nutrition supplement (JUVEN) Pack Take 1 packet by mouth 2 (two) times daily between meals.   ascorbic acid 500 MG tablet Commonly known as: VITAMIN C Take 1 tablet (500 mg total) by mouth 2 (two) times daily.   bisacodyl 5 MG EC tablet Generic drug: bisacodyl Take 5 mg by mouth daily as needed for moderate constipation.   cefadroxil 500 MG capsule Commonly known as: DURICEF Take 1 capsule (500 mg total) by mouth 2 (two) times daily for 3 days.   doxycycline 100 MG tablet Commonly known as: VIBRA-TABS Take 1 tablet (100 mg total) by mouth 2 (two) times daily for 3 days.   ferrous sulfate 325 (65 FE) MG tablet Take 325 mg by mouth daily with breakfast.   folic acid 1 MG tablet Commonly known as: FOLVITE Take 1 tablet (1 mg total) by mouth daily.   Gerhardt's butt cream Crea Apply 1 application topically 2 (two) times daily.   HYDROcodone-acetaminophen 5-325 MG tablet Commonly known as: NORCO/VICODIN Take 1 tablet by mouth every 6 (six) hours as needed for moderate pain or severe pain.   lactobacillus acidophilus & bulgar chewable tablet Chew 2 tablets by mouth 3 (three) times daily.   levothyroxine 125 MCG tablet Commonly known as: Euthyrox Take 1  tablet (125 mcg total) by mouth daily before breakfast. What changed:  medication strength how much to take   mirtazapine 15 MG tablet Commonly known as: REMERON Take 1 tablet (15 mg total) by mouth at bedtime.   ondansetron 4 MG tablet Commonly known as: ZOFRAN Take 4 mg by mouth every 8 (eight) hours as needed for nausea or vomiting.   Vitamin D3 125 MCG (5000 UT) Tabs Take 1 tablet by mouth daily.   zinc sulfate 220 (50 Zn) MG capsule Take 1 capsule (220 mg total) by mouth daily.               Discharge Care Instructions  (From admission, onward)           Start  Ordered   06/28/21 0000  Discharge wound care:       Comments: Perform daily.   Use a Sacral foam dressing over the sacrum/coccyx. Lift each shift and evaluate the Deep Tissue Pressure Injury to the area.  Apply iodine from the swabsticks or swab pads from clean utility to the left inner ankle scab.  Allow to air dry.   06/28/21 1142            CT ABDOMEN PELVIS WO CONTRAST  Result Date: 06/26/2021 CLINICAL DATA:  Increasing weakness with decreasing urine output. EXAM: CT ABDOMEN AND PELVIS WITHOUT CONTRAST TECHNIQUE: Multidetector CT imaging of the abdomen and pelvis was performed following the standard protocol without IV contrast. COMPARISON:  Chest CT no contrast 04/23/2020, abdomen and pelvis CT with IV contrast 03/15/2021. FINDINGS: Lower chest: Mild cardiomegaly with coronary artery calcifications is again seen. There is development of small bilateral layering pleural effusions, with adjacent opacity in the left greater than right lower lobes which could be atelectasis or pneumonia. There is an 8 mm stable nodule in the posteromedial aspect of the right middle lobe, unchanged since 04/23/2020. Hepatobiliary: 20 cm length mildly steatotic liver. Small subcentimeter scattered hypodensities which are too small to characterize but seem unchanged. These were better visualized on the contrasted  exam. Some images may suggest early cirrhotic liver configuration. There are multiple stones in the gallbladder and a distended gallbladder, without wall thickening or increased biliary dilatation with common bile duct stable in prominence at 8.6 mm. Pancreas: Fatty atrophic with increased stranding at the pancreatic head and uncinate process concerning for acute pancreatitis. There is no peripancreatic fluid, no mass is seen or ductal dilatation. Spleen: Slightly prominent 12.9 cm coronal and otherwise unremarkable without contrast. A faint 1.6 cm hypodensity was visible on the contrast exam but is not able to be seen without contrast. Adrenals/Urinary Tract: There is no adrenal mass. No focal abnormality in the unenhanced renal cortex. There are multiple scattered bilateral punctate nonobstructive caliceal stones. There is no hydronephrosis or ureteral stone. The bladder is contracted around a Foley balloon and not well seen. Stomach/Bowel: No dilatation or wall thickening including the appendix is seen through the sigmoid colon. Diffuse diverticulosis greatest along the left colon is seen , no findings of acute diverticulitis. There is increased circumferential rectal thickening probably due to proctitis, less likely infiltrating disease given the short interval time frame of its appearance. There is increased perirectal stranding. Vascular/Lymphatic: Aortic atherosclerosis. Mildly enlarged periportal lymph nodes are again noted up to 1.4 cm in short axis, unchanged from 3 months ago, with slight increased prominence of hypogastric lymph nodes today and no further adenopathy. Reproductive: A calcified uterine fibroid is again noted of the right-sided fundus. 3.5 x 2.5 cm right ovarian cystic lesion with calcification is stable since a remote previous CT of 11/08/2007, presumably inconsequential. Other: There is increasing presacral edema possibly related to proctitis or possibly congestive. There is increased  body wall edema in the flanks, likely either congestive or from fluid overload and small amount of posterior deep pelvic and pericolic gutter ascites. There is no free air, free hemorrhage or abscess. Musculoskeletal: There is osteopenia , levorotary lumbar scoliosis and advanced degenerative changes of the spine, moderate bilateral hip DJD. 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 IMPRESSION: 1. Small layering pleural effusions with adjacent left-greater-than-right lower lobe opacities consistent with atelectasis or pneumonia. 2. Increased stranding at the pancreatic head and uncinate process concerning for pancreatitis. Laboratory and clinical correlation  advised. This is associated with slightly prominent hypogastric lymph nodes. 3. Nonobstructive micronephrolithiasis. 4. Diverticulosis without evidence of diverticulitis. 5. Scattered hepatic hypodensities which are too small to characterize, better visualized on the recent contrast-enhanced exam. 6. Periportal adenopathy unchanged compared with the recent prior study. 7. Cholelithiasis. 8. Proctitis versus congestive wall thickening or less likely infiltrating disease. 9. Small volume of ascites. 10. Increased presacral stranding with increased bilateral stranding in the flanks, likely congestive or due to fluid overload. 11. Additional findings described above. Electronically Signed   By: Almira Bar M.D.   On: 06/26/2021 01:19   DG Ankle 2 Views Left  Result Date: 06/26/2021 CLINICAL DATA:  Pain and limited range of motion. History of ankle fractures and surgery. EXAM: LEFT ANKLE - 2 VIEW COMPARISON:  CT scan 04/29/2021 FINDINGS: Lateral sideplate on the fibula with transfixing compression screws. The fracture appears healed. Two medial malleolar screws transfixing the medial malleolus fracture. This appears to be healed. Single cannulated screw in the AP direction transfixing the posterior malleolar fracture. This appears to be healed. The  tibiotalar and subtalar joints are grossly maintained. Moderate-sized calcaneal heel spur. IMPRESSION: Healed trimalleolar fractures with transfixing hardware. No acute bony findings. Electronically Signed   By: Rudie Meyer M.D.   On: 06/26/2021 13:59   US RENAL  Result Date: 06/24/2021 CLINICAL DATA:  Acute on chronic renal insufficiency, renal calculus EXAM: RENAL / URINARY TRACT ULTRASOUND COMPLETE COMPARISON:  05/01/2021 FINDINGS: Right Kidney: Renal measurements: 10.1 x 4.5 x 5.6 cm = volume: 134 mL. Echogenicity within normal limits. No mass or hydronephrosis visualized. Left Kidney: Not visualized due to patient body habitus and bowel gas. Bladder: Decompressed, limiting evaluation. Other: None. IMPRESSION: 1. Nonvisualization of the left kidney due to bowel gas and patient body habitus. 2. Unremarkable right kidney. Electronically Signed   By: Sharlet Salina M.D.   On: 06/24/2021 22:43   DG CHEST PORT 1 VIEW  Result Date: 06/29/2021 CLINICAL DATA:  Shortness of breath, pleural effusion EXAM: PORTABLE CHEST 1 VIEW COMPARISON:  Chest radiograph 04/28/2021 FINDINGS: The heart is mildly enlarged, unchanged. The upper mediastinal contours are within normal limits. There is dense retrocardiac opacity likely reflecting a small pleural effusion and adjacent atelectasis/consolidation. The left upper lung is well-aerated. There is a probable trace right pleural effusion. The right lung is otherwise clear. There is no pneumothorax. There is no acute osseous abnormality. IMPRESSION: Small left and probable trace right pleural effusions with dense retrocardiac opacity which may reflect atelectasis or infection. Electronically Signed   By: Lesia Hausen M.D.   On: 06/29/2021 09:35   VAS [place] LOWER EXTREMITY VENOUS (DVT)  Result Date: 06/25/2021  Lower Venous DVT Study Patient Name:  BLIMA HUITT [facility]  Date of Exam:   06/25/2021 Medical Rec #: [id number]         Accession #:    [id number] Date of Birth:  13-May-1946          Patient Gender: F Patient Age:   7 years Exam Location:  [facility] Procedure:      VAS [place] LOWER EXTREMITY VENOUS (DVT) Referring Phys: ERIC [place] --------------------------------------------------------------------------------  Indications: Edema. Other Indications: LLE ankle fracture wth surgical repair (04/2021). Risk Factors: Immobility due to ankle fracture. Limitations: Body habitus and poor ultrasound/tissue interface. Comparison Study: No previous exams Performing Technologist: Jody Hill RVT, RDMS  Examination Guidelines: A complete evaluation includes B-mode imaging, spectral Doppler, color Doppler, and power Doppler as needed of all accessible portions of each  vessel. Bilateral testing is considered an integral part of a complete examination. Limited examinations for reoccurring indications may be performed as noted. The reflux portion of the exam is performed with the patient in reverse Trendelenburg.  +---------+---------------+---------+-----------+----------+-------------------+  RIGHT     Compressibility Phasicity Spontaneity Properties Thrombus Aging       +---------+---------------+---------+-----------+----------+-------------------+  CFV       Full            Yes       Yes                                         +---------+---------------+---------+-----------+----------+-------------------+  SFJ       Full                                                                  +---------+---------------+---------+-----------+----------+-------------------+  FV Prox   Full            Yes       Yes                                         +---------+---------------+---------+-----------+----------+-------------------+  FV Mid    Full            Yes       Yes                                         +---------+---------------+---------+-----------+----------+-------------------+  FV Distal Full            Yes       Yes                                          +---------+---------------+---------+-----------+----------+-------------------+  PFV       Full                                                                  +---------+---------------+---------+-----------+----------+-------------------+  POP       Full            Yes       Yes                                         +---------+---------------+---------+-----------+----------+-------------------+  PTV       Full                                             Not well visualized  +---------+---------------+---------+-----------+----------+-------------------+  PERO      Full                                             Not well visualized  +---------+---------------+---------+-----------+----------+-------------------+   +---------+---------------+---------+-----------+----------+-------------------+  LEFT      Compressibility Phasicity Spontaneity Properties Thrombus Aging       +---------+---------------+---------+-----------+----------+-------------------+  CFV       Full            Yes       Yes                                         +---------+---------------+---------+-----------+----------+-------------------+  SFJ       Full                                                                  +---------+---------------+---------+-----------+----------+-------------------+  FV Prox   Full            Yes       Yes                                         +---------+---------------+---------+-----------+----------+-------------------+  FV Mid    Full            Yes       Yes                                         +---------+---------------+---------+-----------+----------+-------------------+  FV Distal Full            Yes       Yes                                         +---------+---------------+---------+-----------+----------+-------------------+  PFV       Full                                                                  +---------+---------------+---------+-----------+----------+-------------------+   POP       Full            Yes       Yes                                         +---------+---------------+---------+-----------+----------+-------------------+  PTV  Not visualized on                                                                this exam            +---------+---------------+---------+-----------+----------+-------------------+  PERO                                                       Not visualized on                                                                this exam            +---------+---------------+---------+-----------+----------+-------------------+   Left Technical Findings: Not visualized segments include posterior tibial and peroneal veins.   Summary: BILATERAL: - No evidence of deep vein thrombosis seen in the lower extremities, bilaterally. - No evidence of superficial venous thrombosis in the lower extremities, bilaterally. -No evidence of popliteal cyst, bilaterally. Subcutaneous edema noted bilaterally.  LEFT: - Portions of this examination were limited- see technologist comments above.  *See table(s) above for measurements and observations. Electronically signed by Waverly Ferrari MD on 06/25/2021 at 1:57:09 PM.    Final    Results for orders placed or performed during the hospital encounter of 06/24/21  Urine Culture     Status: None   Collection Time: 06/25/21  9:51 AM   Specimen: Urine, Catheterized  Result Value Ref Range Status   Specimen Description   Final    URINE, CATHETERIZED Performed at [facility], [address], [place], [place] [address]    Special Requests   Final    NONE Performed at [facility], [address], [place], [place] [address]    Culture   Final    NO GROWTH Performed at [facility], [address] [place], [place] [address]    Report Status 06/26/2021 FINAL  Final  Resp Panel by RT-PCR (Flu A&B, Covid) Nasopharyngeal  Swab     Status: Abnormal   Collection Time: 06/28/21 12:30 PM   Specimen: Nasopharyngeal Swab; Nasopharyngeal(NP) swabs in vial transport medium  Result Value Ref Range Status   SARS Coronavirus 2 by RT PCR POSITIVE (A) NEGATIVE Final    Comment: (NOTE) SARS-CoV-2 target nucleic acids are DETECTED.  The SARS-CoV-2 RNA is generally detectable in upper respiratory specimens during the acute phase of infection. Positive results are indicative of the presence of the identified virus, but do not rule out bacterial infection or co-infection with other pathogens not detected by the test. Clinical correlation with patient history and other diagnostic information is necessary to determine patient infection status. The expected result is Negative.  Fact Sheet for Patients: BloggerCourse.com  Fact Sheet for Healthcare Providers: SeriousBroker.it  This test is not yet approved or cleared by the [place] FDA and  has been authorized for detection and/or diagnosis of SARS-CoV-2  by FDA under an Emergency Use Authorization (EUA).  This EUA will remain in effect (meaning this test can be used) for the duration of  the COVID-19 declaration under Section 564(b)(1) of the A ct, 21 U.S.C. section 360bbb-3(b)(1), unless the authorization is terminated or revoked sooner.     Influenza A by PCR NEGATIVE NEGATIVE Final   Influenza B by PCR NEGATIVE NEGATIVE Final    Comment: (NOTE) The Xpert Xpress SARS-CoV-2/FLU/RSV plus assay is intended as an aid in the diagnosis of influenza from Nasopharyngeal swab specimens and should not be used as a sole basis for treatment. Nasal washings and aspirates are unacceptable for Xpert Xpress SARS-CoV-2/FLU/RSV testing.  Fact Sheet for Patients: EntrepreneurPulse.com.au  Fact Sheet for Healthcare Providers: IncredibleEmployment.be  This test is not yet approved or cleared  by the [place] FDA and has been authorized for detection and/or diagnosis of SARS-CoV-2 by FDA under an Emergency Use Authorization (EUA). This EUA will remain in effect (meaning this test can be used) for the duration of the COVID-19 declaration under Section 564(b)(1) of the Act, 21 U.S.C. section 360bbb-3(b)(1), unless the authorization is terminated or revoked.  Performed at [facility], [address], [place], [place] [address]     Signed:  Berle Mull MD.  [facility] 07/01/2021, 12:55 PM

## 2021-07-02 DIAGNOSIS — R440 Auditory hallucinations: Secondary | ICD-10-CM | POA: Diagnosis not present

## 2021-07-02 DIAGNOSIS — F411 Generalized anxiety disorder: Secondary | ICD-10-CM | POA: Diagnosis not present

## 2021-07-02 DIAGNOSIS — F331 Major depressive disorder, recurrent, moderate: Secondary | ICD-10-CM | POA: Diagnosis not present

## 2021-07-03 DIAGNOSIS — E039 Hypothyroidism, unspecified: Secondary | ICD-10-CM | POA: Diagnosis not present

## 2021-07-03 DIAGNOSIS — S82892D Other fracture of left lower leg, subsequent encounter for closed fracture with routine healing: Secondary | ICD-10-CM | POA: Diagnosis not present

## 2021-07-03 DIAGNOSIS — I5032 Chronic diastolic (congestive) heart failure: Secondary | ICD-10-CM | POA: Diagnosis not present

## 2021-07-03 DIAGNOSIS — R627 Adult failure to thrive: Secondary | ICD-10-CM | POA: Diagnosis not present

## 2021-07-10 ENCOUNTER — Other Ambulatory Visit: Payer: Self-pay

## 2021-07-10 ENCOUNTER — Inpatient Hospital Stay (HOSPITAL_COMMUNITY)
Admission: EM | Admit: 2021-07-10 | Discharge: 2021-07-30 | DRG: 843 | Disposition: A | Discharge status: Hospice | Payer: Medicare Other | Source: Skilled Nursing Facility | Attending: Family Medicine | Admitting: Family Medicine

## 2021-07-10 ENCOUNTER — Emergency Department (HOSPITAL_COMMUNITY): Payer: Medicare Other

## 2021-07-10 ENCOUNTER — Encounter (HOSPITAL_COMMUNITY): Payer: Self-pay

## 2021-07-10 DIAGNOSIS — K760 Fatty (change of) liver, not elsewhere classified: Secondary | ICD-10-CM | POA: Diagnosis present

## 2021-07-10 DIAGNOSIS — Z515 Encounter for palliative care: Secondary | ICD-10-CM

## 2021-07-10 DIAGNOSIS — R188 Other ascites: Secondary | ICD-10-CM

## 2021-07-10 DIAGNOSIS — Z96653 Presence of artificial knee joint, bilateral: Secondary | ICD-10-CM | POA: Diagnosis present

## 2021-07-10 DIAGNOSIS — R5383 Other fatigue: Secondary | ICD-10-CM | POA: Diagnosis not present

## 2021-07-10 DIAGNOSIS — F32A Depression, unspecified: Secondary | ICD-10-CM | POA: Diagnosis present

## 2021-07-10 DIAGNOSIS — Z79899 Other long term (current) drug therapy: Secondary | ICD-10-CM

## 2021-07-10 DIAGNOSIS — Z841 Family history of disorders of kidney and ureter: Secondary | ICD-10-CM

## 2021-07-10 DIAGNOSIS — K269 Duodenal ulcer, unspecified as acute or chronic, without hemorrhage or perforation: Secondary | ICD-10-CM | POA: Diagnosis present

## 2021-07-10 DIAGNOSIS — N183 Chronic kidney disease, stage 3 unspecified: Secondary | ICD-10-CM | POA: Diagnosis present

## 2021-07-10 DIAGNOSIS — I959 Hypotension, unspecified: Secondary | ICD-10-CM | POA: Diagnosis present

## 2021-07-10 DIAGNOSIS — E875 Hyperkalemia: Secondary | ICD-10-CM | POA: Diagnosis present

## 2021-07-10 DIAGNOSIS — R5381 Other malaise: Secondary | ICD-10-CM | POA: Diagnosis present

## 2021-07-10 DIAGNOSIS — R32 Unspecified urinary incontinence: Secondary | ICD-10-CM | POA: Diagnosis present

## 2021-07-10 DIAGNOSIS — I517 Cardiomegaly: Secondary | ICD-10-CM | POA: Diagnosis not present

## 2021-07-10 DIAGNOSIS — N1831 Chronic kidney disease, stage 3a: Secondary | ICD-10-CM | POA: Diagnosis present

## 2021-07-10 DIAGNOSIS — K3189 Other diseases of stomach and duodenum: Secondary | ICD-10-CM | POA: Diagnosis present

## 2021-07-10 DIAGNOSIS — Z7401 Bed confinement status: Secondary | ICD-10-CM

## 2021-07-10 DIAGNOSIS — R410 Disorientation, unspecified: Secondary | ICD-10-CM | POA: Diagnosis not present

## 2021-07-10 DIAGNOSIS — E860 Dehydration: Secondary | ICD-10-CM | POA: Diagnosis present

## 2021-07-10 DIAGNOSIS — Z8616 Personal history of COVID-19: Secondary | ICD-10-CM

## 2021-07-10 DIAGNOSIS — R601 Generalized edema: Secondary | ICD-10-CM

## 2021-07-10 DIAGNOSIS — Z833 Family history of diabetes mellitus: Secondary | ICD-10-CM

## 2021-07-10 DIAGNOSIS — E785 Hyperlipidemia, unspecified: Secondary | ICD-10-CM | POA: Diagnosis present

## 2021-07-10 DIAGNOSIS — R531 Weakness: Secondary | ICD-10-CM | POA: Diagnosis not present

## 2021-07-10 DIAGNOSIS — Z79890 Hormone replacement therapy (postmenopausal): Secondary | ICD-10-CM

## 2021-07-10 DIAGNOSIS — E039 Hypothyroidism, unspecified: Secondary | ICD-10-CM | POA: Diagnosis present

## 2021-07-10 DIAGNOSIS — E872 Acidosis, unspecified: Secondary | ICD-10-CM | POA: Diagnosis present

## 2021-07-10 DIAGNOSIS — S82899A Other fracture of unspecified lower leg, initial encounter for closed fracture: Secondary | ICD-10-CM | POA: Diagnosis present

## 2021-07-10 DIAGNOSIS — Z66 Do not resuscitate: Secondary | ICD-10-CM | POA: Diagnosis not present

## 2021-07-10 DIAGNOSIS — L89891 Pressure ulcer of other site, stage 1: Secondary | ICD-10-CM | POA: Diagnosis present

## 2021-07-10 DIAGNOSIS — R627 Adult failure to thrive: Secondary | ICD-10-CM | POA: Diagnosis present

## 2021-07-10 DIAGNOSIS — Z7189 Other specified counseling: Secondary | ICD-10-CM

## 2021-07-10 DIAGNOSIS — L8990 Pressure ulcer of unspecified site, unspecified stage: Secondary | ICD-10-CM

## 2021-07-10 DIAGNOSIS — R791 Abnormal coagulation profile: Secondary | ICD-10-CM

## 2021-07-10 DIAGNOSIS — N179 Acute kidney failure, unspecified: Secondary | ICD-10-CM | POA: Diagnosis not present

## 2021-07-10 DIAGNOSIS — D696 Thrombocytopenia, unspecified: Secondary | ICD-10-CM | POA: Diagnosis present

## 2021-07-10 DIAGNOSIS — I5032 Chronic diastolic (congestive) heart failure: Secondary | ICD-10-CM | POA: Diagnosis present

## 2021-07-10 DIAGNOSIS — R609 Edema, unspecified: Secondary | ICD-10-CM | POA: Diagnosis not present

## 2021-07-10 DIAGNOSIS — D649 Anemia, unspecified: Secondary | ICD-10-CM | POA: Diagnosis present

## 2021-07-10 DIAGNOSIS — E8809 Other disorders of plasma-protein metabolism, not elsewhere classified: Principal | ICD-10-CM | POA: Diagnosis present

## 2021-07-10 DIAGNOSIS — K279 Peptic ulcer, site unspecified, unspecified as acute or chronic, without hemorrhage or perforation: Secondary | ICD-10-CM

## 2021-07-10 DIAGNOSIS — W19XXXD Unspecified fall, subsequent encounter: Secondary | ICD-10-CM | POA: Diagnosis present

## 2021-07-10 DIAGNOSIS — D689 Coagulation defect, unspecified: Secondary | ICD-10-CM | POA: Diagnosis present

## 2021-07-10 DIAGNOSIS — K7682 Hepatic encephalopathy: Secondary | ICD-10-CM | POA: Diagnosis not present

## 2021-07-10 DIAGNOSIS — R7989 Other specified abnormal findings of blood chemistry: Secondary | ICD-10-CM | POA: Diagnosis present

## 2021-07-10 DIAGNOSIS — Z8249 Family history of ischemic heart disease and other diseases of the circulatory system: Secondary | ICD-10-CM

## 2021-07-10 DIAGNOSIS — Z6841 Body Mass Index (BMI) 40.0 and over, adult: Secondary | ICD-10-CM

## 2021-07-10 DIAGNOSIS — F419 Anxiety disorder, unspecified: Secondary | ICD-10-CM | POA: Diagnosis present

## 2021-07-10 DIAGNOSIS — I130 Hypertensive heart and chronic kidney disease with heart failure and stage 1 through stage 4 chronic kidney disease, or unspecified chronic kidney disease: Secondary | ICD-10-CM | POA: Diagnosis present

## 2021-07-10 DIAGNOSIS — G9341 Metabolic encephalopathy: Secondary | ICD-10-CM | POA: Diagnosis present

## 2021-07-10 DIAGNOSIS — S82892D Other fracture of left lower leg, subsequent encounter for closed fracture with routine healing: Secondary | ICD-10-CM

## 2021-07-10 DIAGNOSIS — Z820 Family history of epilepsy and other diseases of the nervous system: Secondary | ICD-10-CM

## 2021-07-10 DIAGNOSIS — I480 Paroxysmal atrial fibrillation: Secondary | ICD-10-CM | POA: Diagnosis present

## 2021-07-10 DIAGNOSIS — E43 Unspecified severe protein-calorie malnutrition: Secondary | ICD-10-CM | POA: Diagnosis present

## 2021-07-10 DIAGNOSIS — R799 Abnormal finding of blood chemistry, unspecified: Secondary | ICD-10-CM

## 2021-07-10 DIAGNOSIS — Z87442 Personal history of urinary calculi: Secondary | ICD-10-CM

## 2021-07-10 DIAGNOSIS — I3139 Other pericardial effusion (noninflammatory): Secondary | ICD-10-CM | POA: Diagnosis present

## 2021-07-10 DIAGNOSIS — J90 Pleural effusion, not elsewhere classified: Secondary | ICD-10-CM | POA: Diagnosis not present

## 2021-07-10 DIAGNOSIS — R630 Anorexia: Secondary | ICD-10-CM

## 2021-07-10 DIAGNOSIS — D509 Iron deficiency anemia, unspecified: Secondary | ICD-10-CM | POA: Diagnosis present

## 2021-07-10 LAB — CBC WITH DIFFERENTIAL/PLATELET
Abs Immature Granulocytes: 0.05 10*3/uL (ref 0.00–0.07)
Abs Immature Granulocytes: 0.03 10*3/uL (ref 0.00–0.07)
Basophils Absolute: 0 10*3/uL (ref 0.0–0.1)
Basophils Absolute: 0 10*3/uL (ref 0.0–0.1)
Basophils Relative: 0 %
Basophils Relative: 0 %
Eosinophils Absolute: 0.3 10*3/uL (ref 0.0–0.5)
Eosinophils Absolute: 0.2 10*3/uL (ref 0.0–0.5)
Eosinophils Relative: 3 %
Eosinophils Relative: 2 %
HCT: 32 % — ABNORMAL LOW (ref 36.0–46.0)
HCT: 28.5 % — ABNORMAL LOW (ref 36.0–46.0)
Hemoglobin: 10 g/dL — ABNORMAL LOW (ref 12.0–15.0)
Hemoglobin: 9.1 g/dL — ABNORMAL LOW (ref 12.0–15.0)
Immature Granulocytes: 1 %
Immature Granulocytes: 0 %
Lymphocytes Relative: 33 %
Lymphocytes Relative: 28 %
Lymphs Abs: 2.9 10*3/uL (ref 0.7–4.0)
Lymphs Abs: 2.1 10*3/uL (ref 0.7–4.0)
MCH: 29.9 pg (ref 26.0–34.0)
MCH: 29.9 pg (ref 26.0–34.0)
MCHC: 31.3 g/dL (ref 30.0–36.0)
MCHC: 31.9 g/dL (ref 30.0–36.0)
MCV: 95.8 fL (ref 80.0–100.0)
MCV: 93.8 fL (ref 80.0–100.0)
Monocytes Absolute: 1 10*3/uL (ref 0.1–1.0)
Monocytes Absolute: 1.1 10*3/uL — ABNORMAL HIGH (ref 0.1–1.0)
Monocytes Relative: 11 %
Monocytes Relative: 15 %
Neutro Abs: 4.6 10*3/uL (ref 1.7–7.7)
Neutro Abs: 4 10*3/uL (ref 1.7–7.7)
Neutrophils Relative %: 52 %
Neutrophils Relative %: 55 %
Platelets: UNDETERMINED 10*3/uL (ref 150–400)
Platelets: 108 10*3/uL — ABNORMAL LOW (ref 150–400)
RBC: 3.34 MIL/uL — ABNORMAL LOW (ref 3.87–5.11)
RBC: 3.04 MIL/uL — ABNORMAL LOW (ref 3.87–5.11)
RDW: 22.5 % — ABNORMAL HIGH (ref 11.5–15.5)
RDW: 22.5 % — ABNORMAL HIGH (ref 11.5–15.5)
WBC: 8.8 10*3/uL (ref 4.0–10.5)
WBC: 7.4 10*3/uL (ref 4.0–10.5)
nRBC: 0.2 % (ref 0.0–0.2)
nRBC: 0.3 % — ABNORMAL HIGH (ref 0.0–0.2)

## 2021-07-10 LAB — URINALYSIS, ROUTINE W REFLEX MICROSCOPIC
Bilirubin Urine: NEGATIVE
Glucose, UA: NEGATIVE mg/dL
Hgb urine dipstick: NEGATIVE
Ketones, ur: NEGATIVE mg/dL
Leukocytes,Ua: NEGATIVE
Nitrite: NEGATIVE
Protein, ur: NEGATIVE mg/dL
Specific Gravity, Urine: 1.014 (ref 1.005–1.030)
pH: 5 (ref 5.0–8.0)

## 2021-07-10 LAB — COMPREHENSIVE METABOLIC PANEL
ALT: 19 U/L (ref 0–44)
AST: 16 U/L (ref 15–41)
Albumin: 2.2 g/dL — ABNORMAL LOW (ref 3.5–5.0)
Alkaline Phosphatase: 319 U/L — ABNORMAL HIGH (ref 38–126)
Anion gap: 10 (ref 5–15)
BUN: 110 mg/dL — ABNORMAL HIGH (ref 8–23)
CO2: 21 mmol/L — ABNORMAL LOW (ref 22–32)
Calcium: 8.6 mg/dL — ABNORMAL LOW (ref 8.9–10.3)
Chloride: 109 mmol/L (ref 98–111)
Creatinine, Ser: 1.17 mg/dL — ABNORMAL HIGH (ref 0.44–1.00)
GFR, Estimated: 48 mL/min — ABNORMAL LOW (ref >60)
Glucose, Bld: 88 mg/dL (ref 70–99)
Potassium: 4.2 mmol/L (ref 3.5–5.1)
Sodium: 140 mmol/L (ref 135–145)
Total Bilirubin: 1.2 mg/dL (ref 0.3–1.2)
Total Protein: 5.5 g/dL — ABNORMAL LOW (ref 6.5–8.1)

## 2021-07-10 LAB — APTT: aPTT: 41 seconds — ABNORMAL HIGH (ref 24–36)

## 2021-07-10 LAB — LACTIC ACID, PLASMA
Lactic Acid, Venous: 2.6 mmol/L (ref 0.5–1.9)
Lactic Acid, Venous: 2.3 mmol/L (ref 0.5–1.9)
Lactic Acid, Venous: 2.6 mmol/L (ref 0.5–1.9)

## 2021-07-10 LAB — HEMOGLOBIN AND HEMATOCRIT, BLOOD
HCT: 26.1 % — ABNORMAL LOW (ref 36.0–46.0)
Hemoglobin: 8.5 g/dL — ABNORMAL LOW (ref 12.0–15.0)

## 2021-07-10 LAB — POC OCCULT BLOOD, ED: Fecal Occult Bld: POSITIVE — AB

## 2021-07-10 LAB — PROTIME-INR
INR: 2.1 — ABNORMAL HIGH (ref 0.8–1.2)
Prothrombin Time: 23.7 seconds — ABNORMAL HIGH (ref 11.4–15.2)

## 2021-07-10 LAB — PROCALCITONIN: Procalcitonin: 2.13 ng/mL

## 2021-07-10 LAB — LACTATE DEHYDROGENASE: LDH: 240 U/L — ABNORMAL HIGH (ref 98–192)

## 2021-07-10 MED ORDER — ACETAMINOPHEN 650 MG RE SUPP: 650.0000 mg | Freq: Four times a day (QID) | RECTAL | Status: DC | PRN

## 2021-07-10 MED ORDER — SODIUM CHLORIDE 0.9 % IV SOLN: Freq: Once | INTRAVENOUS | Status: AC

## 2021-07-10 MED ORDER — SODIUM CHLORIDE 0.9 % IV SOLN
2.0000 g | INTRAVENOUS | Status: DC
  Filled 2021-07-10: qty 20

## 2021-07-10 MED ORDER — ASCORBIC ACID 500 MG PO TABS
500.0000 mg | ORAL_TABLET | Freq: Two times a day (BID) | ORAL | Status: DC
  Administered 2021-07-10 – 2021-07-28 (×35): 500 mg via ORAL
  Filled 2021-07-10 (×38): qty 1

## 2021-07-10 MED ORDER — FLUDROCORTISONE ACETATE 0.1 MG PO TABS
100.0000 ug | ORAL_TABLET | Freq: Every day | ORAL | Status: DC
  Filled 2021-07-10: qty 1

## 2021-07-10 MED ORDER — SODIUM CHLORIDE 0.9 % IV BOLUS
500.0000 mL | Freq: Once | INTRAVENOUS | Status: AC
  Administered 2021-07-10: 23:00:00 500 mL via INTRAVENOUS

## 2021-07-10 MED ORDER — FOLIC ACID 1 MG PO TABS
1.0000 mg | ORAL_TABLET | Freq: Every day | ORAL | Status: DC
  Administered 2021-07-11 – 2021-07-27 (×16): 1 mg via ORAL
  Filled 2021-07-10 (×18): qty 1

## 2021-07-10 MED ORDER — SODIUM CHLORIDE 0.9 % IV SOLN
2.0000 g | Freq: Once | INTRAVENOUS | Status: AC
  Administered 2021-07-10: 08:00:00 2 g via INTRAVENOUS
  Filled 2021-07-10: qty 20

## 2021-07-10 MED ORDER — HYDROCODONE-ACETAMINOPHEN 5-325 MG PO TABS
1.0000 | ORAL_TABLET | Freq: Four times a day (QID) | ORAL | Status: DC | PRN
  Filled 2021-07-10: qty 1

## 2021-07-10 MED ORDER — LACTINEX PO CHEW
2.0000 | CHEWABLE_TABLET | Freq: Three times a day (TID) | ORAL | Status: DC
  Administered 2021-07-10 – 2021-07-28 (×42): 2 via ORAL
  Filled 2021-07-10 (×58): qty 2

## 2021-07-10 MED ORDER — LEVOTHYROXINE SODIUM 125 MCG PO TABS
125.0000 ug | ORAL_TABLET | Freq: Every day | ORAL | Status: DC
  Administered 2021-07-11 – 2021-07-29 (×19): 125 ug via ORAL
  Filled 2021-07-10 (×19): qty 1

## 2021-07-10 MED ORDER — PROSOURCE PLUS PO LIQD
30.0000 mL | Freq: Two times a day (BID) | ORAL | Status: DC
  Administered 2021-07-11 – 2021-07-27 (×13): 30 mL via ORAL
  Filled 2021-07-10 (×24): qty 30

## 2021-07-10 MED ORDER — ALBUMIN HUMAN 25 % IV SOLN
12.5000 g | Freq: Once | INTRAVENOUS | Status: AC
  Administered 2021-07-10: 21:00:00 12.5 g via INTRAVENOUS
  Filled 2021-07-10: qty 50

## 2021-07-10 MED ORDER — SODIUM CHLORIDE 0.9 % IV BOLUS
1000.0000 mL | Freq: Once | INTRAVENOUS | Status: AC
  Administered 2021-07-10: 05:00:00 1000 mL via INTRAVENOUS

## 2021-07-10 MED ORDER — ACETAMINOPHEN 325 MG PO TABS: 650.0000 mg | ORAL_TABLET | Freq: Four times a day (QID) | ORAL | Status: DC | PRN

## 2021-07-10 MED ORDER — VITAMIN D3 25 MCG (1000 UNIT) PO TABS
5000.0000 [IU] | ORAL_TABLET | Freq: Every day | ORAL | Status: DC
  Administered 2021-07-11 – 2021-07-27 (×16): 5000 [IU] via ORAL
  Filled 2021-07-10 (×18): qty 5

## 2021-07-10 MED ORDER — ZINC SULFATE 220 (50 ZN) MG PO CAPS
220.0000 mg | ORAL_CAPSULE | Freq: Every day | ORAL | Status: DC
  Administered 2021-07-11 – 2021-07-15 (×5): 220 mg via ORAL
  Filled 2021-07-10 (×5): qty 1

## 2021-07-10 MED ORDER — BISACODYL 5 MG PO TBEC: 5.0000 mg | DELAYED_RELEASE_TABLET | Freq: Every day | ORAL | Status: DC | PRN

## 2021-07-10 MED ORDER — ONDANSETRON HCL 4 MG PO TABS
4.0000 mg | ORAL_TABLET | Freq: Three times a day (TID) | ORAL | Status: DC | PRN
  Administered 2021-07-21 – 2021-07-28 (×2): 4 mg via ORAL
  Filled 2021-07-10 (×2): qty 1

## 2021-07-10 MED ORDER — ESCITALOPRAM OXALATE 10 MG PO TABS
5.0000 mg | ORAL_TABLET | Freq: Every day | ORAL | Status: DC
  Administered 2021-07-10 – 2021-07-12 (×3): 5 mg via ORAL
  Filled 2021-07-10: qty 0.5
  Filled 2021-07-10 (×3): qty 1

## 2021-07-10 NOTE — ED Notes (Signed)
Critical value lactic 2.6 reported to Pilar Plate, MD

## 2021-07-10 NOTE — ED Triage Notes (Signed)
Sent here from [facility] for a five day history of weakness, intermittent confusion and elevated Cr and BUN.

## 2021-07-10 NOTE — ED Provider Notes (Addendum)
General weakness.  Hypotension.  Admit. Physical Exam  BP 120/82    Pulse 81    Temp 98.4 F (36.9 C) (Oral)    Resp 17    Ht 5\' 8"  (1.727 m)    Wt (!) 144.2 kg    LMP 07/14/2001    SpO2 100%    BMI 48.34 kg/m   Physical Exam Constitutional:      Comments: Patient is awake and alert.  No respiratory distress at rest.  HENT:     Mouth/Throat:     Pharynx: Oropharynx is clear.  Eyes:     Extraocular Movements: Extraocular movements intact.  Pulmonary:     Effort: Pulmonary effort is normal.  Abdominal:     Palpations: Abdomen is soft.     Comments: I have examined under the pannus folds of the abdomen and upper legs.  There is no macerated rash within the folds.    Genitourinary:    Comments: Rectal area has soft stool.  Digital exam is for soft brown stool in the vault.  No melena no blood.  Patient is morbidly obese with multiple skin folds around the genital region.  When retracting the labia, there is still soft stool within the vaginal folds. Musculoskeletal:     Comments: Patient has diffuse soft pitting edema on both the upper and the lower extremities.  Any consistent with anasarca.  Nonhealing dry eschar on medial left ankle from surgical wound.  Some surrounding erythema.  No appearance of diffuse cellulitis.  A lot of scaling of the foot  Neurological:     General: No focal deficit present.    ED Course/Procedures     Procedures  MDM   BUN is significantly elevated.  Creatinine is stable.  Rectal exam does not show melena.  Patient's hemoglobin appears stable from baseline.  Doubt significant GI bleed.  Patient has anasarca and hyper morbid obesity.  She is mostly immobile.  Patient has mild lactic acidosis.  At this time nursing is attempting to get a cath specimen for UA as a likely source of infection given the amount of stool that is within the folds of the vagina.  Patient empirically been started on Rocephin.  Patient's blood pressures initially rebounded but repeat  appears to be soft again at the low 100s.  Will call for admission.  Consult: Dr. 09/11/2001 [facility] for admission.      Ronaldo Miyamoto, MD 07/10/21 07/12/21    [id number], MD 07/10/21 1021

## 2021-07-10 NOTE — H&P (Signed)
History and Physical    Sally Wise [id number] DOB: 05-27-1946 DOA: 07/10/2021  PCP: [name], MD  Patient coming from: [facility]  Chief Complaint: weakness  HPI: Sally Wise is a 75 y.o. female with medical history significant of pA fib, hypothyroidism, chronic thrombocytopenia, chronic HFpEF, HLD, HTN, depression. Presenting with weakness and lethargy. She is currently in rehab for weakness. She was recently admitted to the hospital for dehydration and weakness. She was released back to rehab for continued treatment on Dec 19th per her account. Her husband notes that she has been "a little foggy" in the mornings, but then is better the in the afternoons when he visits her. He has noticed that she has had a decreased appetite. He reports that the facility called him this morning stating that his wife was hypotensive, lethargic, and weak this morning. They decided to send her to the ED for evaluation.   ED Course: CXR shows a new LLL opacity. Her UA was negative despite her vaginal area being covered in feces. Her lactic acid was elevated. She was given fluids and rocephin. [facility] was called for admission.   Review of Systems:  Review of systems is otherwise negative for all not mentioned in HPI.   PMHx Past Medical History:  Diagnosis Date   Hematuria    evaluation with Dr. Joelyn Oms   Hypothyroidism 06/2010   Kidney stone [id number]   Lichen sclerosus et atrophicus 2/13   biopsy proven   LVH (left ventricular hypertrophy)    Morbid obesity (HCC)    PAF (paroxysmal atrial fibrillation) ([place])    a. Remote hx, re-established care 01/2013 with Dr. Johnsie Cancel for recurrence. 2D echo 01/2013: mod LVH, EF 55-65%, no RWMA, grade 1 d/d. b. 06/2014: started on Xarelto.    Thrombocytopenia ([place])    Vitamin D deficiency 06/2010   Wears glasses     PSHx Past Surgical History:  Procedure Laterality Date   BREAST EXCISIONAL BIOPSY  2010   left--was a vascular lesion   CARPAL  TUNNEL RELEASE Left 08/09/2013   Procedure: LEFT CARPAL TUNNEL RELEASE;  Surgeon: Cammie Sickle., MD;  Location: [place];  Service: Orthopedics;  Laterality: Left;   CATARACT EXTRACTION     COLONOSCOPY  04/2003       DILATION AND CURETTAGE OF UTERUS     HYSTEROSCOPY WITH D & C  8/99   ORIF ANKLE FRACTURE Left 05/07/2021   Procedure: OPEN REDUCTION INTERNAL FIXATION (ORIF) ANKLE FRACTURE;  Surgeon: Wylene Simmer, MD;  Location: [facility];  Service: Orthopedics;  Laterality: Left;   REPLACEMENT TOTAL KNEE Bilateral 12/2006  07/2007    TONSILLECTOMY     TRIGGER FINGER RELEASE Left 08/09/2013   Procedure: LEFT A1/A2 PULLEY RELEASE;  Surgeon: Cammie Sickle., MD;  Location: [place];  Service: Orthopedics;  Laterality: Left;   TUBAL LIGATION  5/77    SocHx  reports that she has never smoked. She has never used smokeless tobacco. She reports that she does not drink alcohol and does not use drugs.  No Known Allergies  FamHx Family History  Problem Relation Age of Onset   Hypertension Mother    Kidney disease Mother    Alzheimer's disease Father    Diabetes Brother    Hypertension Brother    Spina bifida Daughter     Prior to Admission medications   Medication Sig Start Date End Date Taking? Authorizing Provider  ascorbic acid (VITAMIN C) 500 MG  tablet Take 1 tablet (500 mg total) by mouth 2 (two) times daily. 06/28/21  Yes Rolly Salter, MD  bisacodyl 5 MG EC tablet Take 5 mg by mouth daily as needed for moderate constipation.   Yes [provider]  Cholecalciferol (VITAMIN D3) 5000 UNITS TABS Take 1 tablet by mouth daily.   Yes [provider]  escitalopram (LEXAPRO) 5 MG tablet Take 5 mg by mouth at bedtime. 07/02/21  Yes [provider]  ferrous sulfate 325 (65 FE) MG tablet Take 325 mg by mouth daily with breakfast.   Yes [provider]  fludrocortisone (FLORINEF) 0.1 MG tablet Take 100 mcg by mouth daily.  07/08/21  Yes [provider]  folic acid (FOLVITE) 1 MG tablet Take 1 tablet (1 mg total) by mouth daily. 03/17/21  Yes Almon Hercules, MD  HYDROcodone-acetaminophen (NORCO/VICODIN) 5-325 MG tablet Take 1 tablet by mouth every 6 (six) hours as needed for moderate pain or severe pain. 06/28/21  Yes Rolly Salter, MD  lactobacillus acidophilus & bulgar (LACTINEX) chewable tablet Chew 2 tablets by mouth 3 (three) times daily. 06/28/21  Yes Rolly Salter, MD  levothyroxine (EUTHYROX) 125 MCG tablet Take 1 tablet (125 mcg total) by mouth daily before breakfast. 06/29/21  Yes Rolly Salter, MD  nutrition supplement, JUVEN, (JUVEN) PACK Take 1 packet by mouth 2 (two) times daily between meals. Patient taking differently: Take 1 packet by mouth 2 (two) times daily. Midday and bedtime 06/28/21  Yes Rolly Salter, MD  Nutritional Supplements (,FEEDING SUPPLEMENT, PROSOURCE PLUS) liquid Take 30 mLs by mouth 2 (two) times daily between meals. 06/28/21  Yes Rolly Salter, MD  ondansetron (ZOFRAN) 4 MG tablet Take 4 mg by mouth every 8 (eight) hours as needed for nausea or vomiting.   Yes [provider]  zinc sulfate 220 (50 Zn) MG capsule Take 1 capsule (220 mg total) by mouth daily. 06/29/21  Yes Rolly Salter, MD  feeding supplement (ENSURE ENLIVE / ENSURE PLUS) LIQD Take 237 mLs by mouth 2 (two) times daily between meals. Patient not taking: Reported on 07/10/2021 06/28/21   Rolly Salter, MD  mirtazapine (REMERON) 15 MG tablet Take 1 tablet (15 mg total) by mouth at bedtime. Patient not taking: Reported on 07/10/2021 06/28/21   Rolly Salter, MD  Nystatin (GERHARDT'S BUTT CREAM) CREA Apply 1 application topically 2 (two) times daily. Patient not taking: Reported on 07/10/2021 06/28/21   Rolly Salter, MD    Physical Exam: Vitals:   07/10/21 0530 07/10/21 0731 07/10/21 0830 07/10/21 1054  BP: 120/82 126/87 108/74 95/76  Pulse: 81 86 85 90  Resp: 17 19 16 18   Temp:   97.8 F (36.6 C)    TempSrc:  Oral    SpO2: 100% 100% 100% 100%  Weight:      Height:        General: 75 y.o. female resting in bed in NAD Eyes: PERRL, normal sclera ENMT: Nares patent w/o discharge, orophaynx clear, dentition normal, ears w/o discharge/lesions/ulcers Neck: Supple, trachea midline Cardiovascular: RRR, +S1, S2, no m/g/r, equal pulses throughout Respiratory: CTABL, no w/r/r, normal WOB GI: BS+, NDNT, no masses noted, no organomegaly noted MSK: No c/c; BLE/BUE edema Neuro: A&O x 3, no focal deficits Psyc: Appropriate interaction and affect, calm/cooperative  Labs on Admission: I have personally reviewed following labs and imaging studies  CBC: Recent Labs  Lab 07/10/21 0350  WBC 8.8  NEUTROABS 4.6  HGB 10.0*  HCT 32.0*  MCV 95.8  PLT PLATELET CLUMPS NOTED ON SMEAR, UNABLE TO ESTIMATE   Basic Metabolic Panel: Recent Labs  Lab 07/10/21 0350  NA 140  K 4.2  CL 109  CO2 21*  GLUCOSE 88  BUN 110*  CREATININE 1.17*  CALCIUM 8.6*   GFR: Estimated Creatinine Clearance: 61 mL/min (A) (by C-G formula based on SCr of 1.17 mg/dL (H)). Liver Function Tests: Recent Labs  Lab 07/10/21 0350  AST 16  ALT 19  ALKPHOS 319*  BILITOT 1.2  PROT 5.5*  ALBUMIN 2.2*   No results for input(s): LIPASE, AMYLASE in the last 168 hours. No results for input(s): AMMONIA in the last 168 hours. Coagulation Profile: Recent Labs  Lab 07/10/21 0350  INR 2.1*   Cardiac Enzymes: No results for input(s): CKTOTAL, CKMB, CKMBINDEX, TROPONINI in the last 168 hours. BNP (last 3 results) No results for input(s): PROBNP in the last 8760 hours. HbA1C: No results for input(s): HGBA1C in the last 72 hours. CBG: No results for input(s): GLUCAP in the last 168 hours. Lipid Profile: No results for input(s): CHOL, HDL, LDLCALC, TRIG, CHOLHDL, LDLDIRECT in the last 72 hours. Thyroid Function Tests: No results for input(s): TSH, T4TOTAL, FREET4, T3FREE, THYROIDAB in the last 72  hours. Anemia Panel: No results for input(s): VITAMINB12, FOLATE, FERRITIN, TIBC, IRON, RETICCTPCT in the last 72 hours. Urine analysis:    Component Value Date/Time   COLORURINE YELLOW 07/10/2021 0915   APPEARANCEUR CLEAR 07/10/2021 0915   LABSPEC 1.014 07/10/2021 0915   PHURINE 5.0 07/10/2021 0915   GLUCOSEU NEGATIVE 07/10/2021 0915   HGBUR NEGATIVE 07/10/2021 0915   BILIRUBINUR NEGATIVE 07/10/2021 0915   BILIRUBINUR neg 04/08/2016 0916   KETONESUR NEGATIVE 07/10/2021 0915   PROTEINUR NEGATIVE 07/10/2021 0915   UROBILINOGEN negative 04/08/2016 0916   UROBILINOGEN 0.2 01/15/2013 1538   NITRITE NEGATIVE 07/10/2021 0915   LEUKOCYTESUR NEGATIVE 07/10/2021 0915    Radiological Exams on Admission: DG Chest Port 1 View  Result Date: 07/10/2021 CLINICAL DATA:  75 year old female with possible sepsis. EXAM: PORTABLE CHEST 1 VIEW COMPARISON:  Chest x-ray 06/29/2021. FINDINGS: Opacity at the left base obscuring the left hemidiaphragm with blunting of the left costophrenic sulcus. Subtle blunting of the right costophrenic sulcus. No pneumothorax. No evidence of pulmonary edema. Heart size is borderline to mildly enlarged. The patient is rotated to the left on today's exam, resulting in distortion of the mediastinal contours and reduced diagnostic sensitivity and specificity for mediastinal pathology. IMPRESSION: 1. Atelectasis and/or consolidation in the left lower lobe with small left pleural effusion. 2. Trace right pleural effusion. 3. Borderline cardiomegaly. Electronically Signed   By: Vinnie Langton M.D.   On: 07/10/2021 05:49    EKG: None obtained in ED  Assessment/Plan Acute metabolic encephalopathy     - place in obs, progressive     - lethargy secondary to dehydration? Infection?     - mentation is A&O x 3 now  Dehydration Azotemia Elevated lactic acid Hypotension     - right now, she got a liter of fluid     - she was started on rocephin for suspicion of UTI, but UA is  negative     -  check procal; follow blood cultures; if procal negative d/c abx  LLL PNA?     - she denies respiratory symptoms     - she was started on rocephin for presumed infection; given her clinical picture right now, would continue rocephin and follow procal  Hypoalbuminemia Anasarca?     -  she is third spacing     - will add one dose albumin     - she needs fluids as well; will give a liter and reassess  Chronic thrombocytopenia Chronic normocytic anemia     - spoke with [facility] (Dr. Alen Blew); no need for plt transfusion right now; she has had problems in the past with plt clumping     - will check LDH, haptoglobin     - she is FOBT positive, but has brown stool     - Hgb is up from previous; hydrate her and follow Hgb  Elevated INR     - checking LDH and haptoglobin, as above     - not sure what to make of this just yet; ?infection picture  Depression     - continue home meds  Hypothyroidism     - continue home synthroid  DVT prophylaxis: SCDs  Code Status: FULL  Family Communication: w/ husband at bedside  Consults called: Spoke with [facility] (Dr. Alen Blew)   Status is: Observation  The patient remains OBS appropriate and will d/c before 2 midnights.  Time spent coordinating admission: 70 minutes  [facility]  If 7PM-7AM, please contact night-coverage [email or web address]  07/10/2021, 11:21 AM

## 2021-07-10 NOTE — ED Provider Notes (Signed)
[facility] Emergency Department Provider Note MRN:  [id number]  Arrival date & time: 07/10/21     Chief Complaint   Weakness   History of Present Illness   Sally Wise is a 75 y.o. year-old female with a history of A. fib obesity presenting to the ED with chief complaint of weakness.  Generalized weakness, malaise, abnormal lab noted at care facility.  Hypotension as well.  Patient denies any pain, no fever.  Some mild cough.  No chest pain or abdominal pain, no shortness of breath, no rash.  Symptoms constant, mild, no exacerbating or alleviating factors.  Review of Systems  A complete 10 system review of systems was obtained and all systems are negative except as noted in the HPI and PMH.   Patient's Health History    Past Medical History:  Diagnosis Date   Hematuria    evaluation with Dr. Joelyn Oms   Hypothyroidism 06/2010   Kidney stone 123XX123   Lichen sclerosus et atrophicus 2/13   biopsy proven   LVH (left ventricular hypertrophy)    Morbid obesity (HCC)    PAF (paroxysmal atrial fibrillation) ([place])    a. Remote hx, re-established care 01/2013 with Dr. Johnsie Cancel for recurrence. 2D echo 01/2013: mod LVH, EF 55-65%, no RWMA, grade 1 d/d. b. 06/2014: started on Xarelto.    Thrombocytopenia ([place])    Vitamin D deficiency 06/2010   Wears glasses     Past Surgical History:  Procedure Laterality Date   BREAST EXCISIONAL BIOPSY  2010   left--was a vascular lesion   CARPAL TUNNEL RELEASE Left 08/09/2013   Procedure: LEFT CARPAL TUNNEL RELEASE;  Surgeon: Cammie Sickle., MD;  Location: [place];  Service: Orthopedics;  Laterality: Left;   CATARACT EXTRACTION     COLONOSCOPY  04/2003       DILATION AND CURETTAGE OF UTERUS     HYSTEROSCOPY WITH D & C  8/99   ORIF ANKLE FRACTURE Left 05/07/2021   Procedure: OPEN REDUCTION INTERNAL FIXATION (ORIF) ANKLE FRACTURE;  Surgeon: Wylene Simmer, MD;  Location[facility];  Service: Orthopedics;   Laterality: Left;   REPLACEMENT TOTAL KNEE Bilateral 12/2006  07/2007    TONSILLECTOMY     TRIGGER FINGER RELEASE Left 08/09/2013   Procedure: LEFT A1/A2 PULLEY RELEASE;  Surgeon: Cammie Sickle., MD;  Location: [place];  Service: Orthopedics;  Laterality: Left;   TUBAL LIGATION  5/77    Family History  Problem Relation Age of Onset   Hypertension Mother    Kidney disease Mother    Alzheimer's disease Father    Diabetes Brother    Hypertension Brother    Spina bifida Daughter     Social History   Socioeconomic History   Marital status: Married    Spouse name: Not on file   Number of children: Not on file   Years of education: Not on file   Highest education level: Not on file  Occupational History   Not on file  Tobacco Use   Smoking status: Never   Smokeless tobacco: Never  Vaping Use   Vaping Use: Never used  Substance and Sexual Activity   Alcohol use: No   Drug use: No   Sexual activity: Yes    Partners: Male    Birth control/protection: Surgical  Other Topics Concern   Not on file  Social History Narrative   Not on file   Social Determinants of Radio broadcast assistant  Strain: Not on file  Food Insecurity: Not on file  Transportation Needs: Not on file  Physical Activity: Not on file  Stress: Not on file  Social Connections: Not on file  Intimate Partner Violence: Not on file     Physical Exam   Vitals:   07/10/21 0445 07/10/21 0530  BP: 130/79 120/82  Pulse: 87 81  Resp: 17 17  Temp:    SpO2: 100% 100%    CONSTITUTIONAL: Well-appearing, NAD NEURO:  Alert and oriented x 3, no focal deficits EYES:  eyes equal and reactive ENT/NECK:  no LAD, no JVD CARDIO: Regular rate, well-perfused, normal S1 and S2 PULM:  CTAB no wheezing or rhonchi GI/GU:  normal bowel sounds, non-distended, non-tender MSK/SPINE:  No gross deformities, no edema SKIN:  no rash, atraumatic PSYCH:  Appropriate speech and behavior  *Additional and/or  pertinent findings included in MDM below  Diagnostic and Interventional Summary    EKG Interpretation  Date/Time:  July 10, 2021 at 4: 38: 48 Ventricular Rate:   90 PR Interval:   163 QRS Duration:  75 QT Interval: 445   QTC Calculation:533   R Axis:     Text Interpretation: Sinus rhythm, low voltage Confirmed by Dr. Gerlene Fee at 4:48 AM       Labs Reviewed  LACTIC ACID, PLASMA - Abnormal; Notable for the following components:      Result Value   Lactic Acid, Venous 2.6 (*)    All other components within normal limits  COMPREHENSIVE METABOLIC PANEL - Abnormal; Notable for the following components:   CO2 21 (*)    BUN 110 (*)    Creatinine, Ser 1.17 (*)    Calcium 8.6 (*)    Total Protein 5.5 (*)    Albumin 2.2 (*)    Alkaline Phosphatase 319 (*)    GFR, Estimated 48 (*)    All other components within normal limits  CBC WITH DIFFERENTIAL/PLATELET - Abnormal; Notable for the following components:   RBC 3.34 (*)    Hemoglobin 10.0 (*)    HCT 32.0 (*)    RDW 22.5 (*)    All other components within normal limits  PROTIME-INR - Abnormal; Notable for the following components:   Prothrombin Time 23.7 (*)    INR 2.1 (*)    All other components within normal limits  APTT - Abnormal; Notable for the following components:   aPTT 41 (*)    All other components within normal limits  CULTURE, BLOOD (ROUTINE X 2)  CULTURE, BLOOD (ROUTINE X 2)  URINE CULTURE  LACTIC ACID, PLASMA  URINALYSIS, ROUTINE W REFLEX MICROSCOPIC    DG Chest Port 1 View  Final Result      Medications  cefTRIAXone (ROCEPHIN) 2 g in sodium chloride 0.9 % 100 mL IVPB (has no administration in time range)  sodium chloride 0.9 % bolus 1,000 mL (0 mLs Intravenous Stopped 07/10/21 0546)     Procedures  /  Critical Care Procedures  ED Course and Medical Decision Making  I have reviewed the triage vital signs, the nursing notes, and pertinent available records from the EMR.  Listed above are  laboratory and imaging tests that I personally ordered, reviewed, and interpreted and then considered in my medical decision making (see below for details).  Hypotension, report of worsened BUN/creatinine.  Blood pressure improved on my evaluation, still soft.  Providing fluids, awaiting labs.  No infectious symptoms noted, no fever.     Awaiting urinalysis.  Labs thus  far reveal elevated lactic acid, BUN is still pending.  Still awaiting urinalysis.  I was present during placement of the suction catheter, patient with feces dried and present throughout her lung folds, vulva.  Multiple vulvar skin lesions/wounds, high risk for infection, UTI given the poor hygiene evident.  Patient also endorsing no p.o. intake for the past 2 days.  Suspect will need admission.  Signed out to oncoming provider.  Elmer Sow. Pilar Plate, MD [facility] Emergency Medicine [facility] [email or web address]  Final Clinical Impressions(s) / ED Diagnoses     ICD-10-CM   1. Weakness  R53.1     2. Hypotension, unspecified hypotension type  I95.9       ED Discharge Orders     None        Discharge Instructions Discussed with and Provided to Patient:   Discharge Instructions   None       Sabas Sous, MD 07/10/21 [phone]

## 2021-07-10 NOTE — Progress Notes (Signed)
Patient transitioned to low air loss mattress.  Denies pain, N/V.  Husband Wes told that patient had arrived on floor.  Rectal temperature 96.4.  MD paged, nightshift RN aware.  Patient covered in warm blankets, hot packs under arms and around neck.  Bradd Burner, RN

## 2021-07-11 ENCOUNTER — Inpatient Hospital Stay (HOSPITAL_COMMUNITY): Payer: Medicare Other

## 2021-07-11 ENCOUNTER — Encounter (HOSPITAL_COMMUNITY): Payer: Self-pay | Admitting: Internal Medicine

## 2021-07-11 DIAGNOSIS — K3189 Other diseases of stomach and duodenum: Secondary | ICD-10-CM | POA: Diagnosis not present

## 2021-07-11 DIAGNOSIS — E872 Acidosis, unspecified: Secondary | ICD-10-CM | POA: Diagnosis present

## 2021-07-11 DIAGNOSIS — R630 Anorexia: Secondary | ICD-10-CM | POA: Diagnosis not present

## 2021-07-11 DIAGNOSIS — R600 Localized edema: Secondary | ICD-10-CM | POA: Diagnosis not present

## 2021-07-11 DIAGNOSIS — E43 Unspecified severe protein-calorie malnutrition: Secondary | ICD-10-CM | POA: Diagnosis present

## 2021-07-11 DIAGNOSIS — E8809 Other disorders of plasma-protein metabolism, not elsewhere classified: Principal | ICD-10-CM

## 2021-07-11 DIAGNOSIS — G319 Degenerative disease of nervous system, unspecified: Secondary | ICD-10-CM | POA: Diagnosis not present

## 2021-07-11 DIAGNOSIS — N1831 Chronic kidney disease, stage 3a: Secondary | ICD-10-CM | POA: Diagnosis not present

## 2021-07-11 DIAGNOSIS — R531 Weakness: Secondary | ICD-10-CM | POA: Diagnosis not present

## 2021-07-11 DIAGNOSIS — R627 Adult failure to thrive: Secondary | ICD-10-CM | POA: Diagnosis not present

## 2021-07-11 DIAGNOSIS — K7682 Hepatic encephalopathy: Secondary | ICD-10-CM | POA: Diagnosis not present

## 2021-07-11 DIAGNOSIS — K2990 Gastroduodenitis, unspecified, without bleeding: Secondary | ICD-10-CM | POA: Diagnosis not present

## 2021-07-11 DIAGNOSIS — Z66 Do not resuscitate: Secondary | ICD-10-CM | POA: Diagnosis not present

## 2021-07-11 DIAGNOSIS — D696 Thrombocytopenia, unspecified: Secondary | ICD-10-CM | POA: Diagnosis present

## 2021-07-11 DIAGNOSIS — Z7189 Other specified counseling: Secondary | ICD-10-CM | POA: Diagnosis not present

## 2021-07-11 DIAGNOSIS — R4182 Altered mental status, unspecified: Secondary | ICD-10-CM | POA: Diagnosis not present

## 2021-07-11 DIAGNOSIS — J90 Pleural effusion, not elsewhere classified: Secondary | ICD-10-CM | POA: Diagnosis not present

## 2021-07-11 DIAGNOSIS — E860 Dehydration: Secondary | ICD-10-CM | POA: Diagnosis present

## 2021-07-11 DIAGNOSIS — I959 Hypotension, unspecified: Secondary | ICD-10-CM | POA: Diagnosis not present

## 2021-07-11 DIAGNOSIS — R110 Nausea: Secondary | ICD-10-CM | POA: Diagnosis not present

## 2021-07-11 DIAGNOSIS — R188 Other ascites: Secondary | ICD-10-CM | POA: Diagnosis not present

## 2021-07-11 DIAGNOSIS — N183 Chronic kidney disease, stage 3 unspecified: Secondary | ICD-10-CM

## 2021-07-11 DIAGNOSIS — I480 Paroxysmal atrial fibrillation: Secondary | ICD-10-CM | POA: Diagnosis not present

## 2021-07-11 DIAGNOSIS — I130 Hypertensive heart and chronic kidney disease with heart failure and stage 1 through stage 4 chronic kidney disease, or unspecified chronic kidney disease: Secondary | ICD-10-CM | POA: Diagnosis present

## 2021-07-11 DIAGNOSIS — J9601 Acute respiratory failure with hypoxia: Secondary | ICD-10-CM | POA: Diagnosis not present

## 2021-07-11 DIAGNOSIS — K269 Duodenal ulcer, unspecified as acute or chronic, without hemorrhage or perforation: Secondary | ICD-10-CM | POA: Diagnosis not present

## 2021-07-11 DIAGNOSIS — D649 Anemia, unspecified: Secondary | ICD-10-CM | POA: Diagnosis not present

## 2021-07-11 DIAGNOSIS — N179 Acute kidney failure, unspecified: Secondary | ICD-10-CM | POA: Diagnosis not present

## 2021-07-11 DIAGNOSIS — Z8616 Personal history of COVID-19: Secondary | ICD-10-CM | POA: Diagnosis not present

## 2021-07-11 DIAGNOSIS — R195 Other fecal abnormalities: Secondary | ICD-10-CM | POA: Diagnosis not present

## 2021-07-11 DIAGNOSIS — R601 Generalized edema: Secondary | ICD-10-CM | POA: Diagnosis present

## 2021-07-11 DIAGNOSIS — I5032 Chronic diastolic (congestive) heart failure: Secondary | ICD-10-CM | POA: Diagnosis present

## 2021-07-11 DIAGNOSIS — D689 Coagulation defect, unspecified: Secondary | ICD-10-CM | POA: Diagnosis present

## 2021-07-11 DIAGNOSIS — E039 Hypothyroidism, unspecified: Secondary | ICD-10-CM | POA: Diagnosis present

## 2021-07-11 DIAGNOSIS — Z515 Encounter for palliative care: Secondary | ICD-10-CM | POA: Diagnosis not present

## 2021-07-11 DIAGNOSIS — D509 Iron deficiency anemia, unspecified: Secondary | ICD-10-CM | POA: Diagnosis not present

## 2021-07-11 DIAGNOSIS — K760 Fatty (change of) liver, not elsewhere classified: Secondary | ICD-10-CM | POA: Diagnosis not present

## 2021-07-11 DIAGNOSIS — Z6841 Body Mass Index (BMI) 40.0 and over, adult: Secondary | ICD-10-CM | POA: Diagnosis not present

## 2021-07-11 DIAGNOSIS — G9341 Metabolic encephalopathy: Secondary | ICD-10-CM | POA: Diagnosis not present

## 2021-07-11 DIAGNOSIS — R7989 Other specified abnormal findings of blood chemistry: Secondary | ICD-10-CM | POA: Diagnosis present

## 2021-07-11 DIAGNOSIS — I3139 Other pericardial effusion (noninflammatory): Secondary | ICD-10-CM | POA: Diagnosis present

## 2021-07-11 DIAGNOSIS — F32A Depression, unspecified: Secondary | ICD-10-CM | POA: Diagnosis present

## 2021-07-11 DIAGNOSIS — N289 Disorder of kidney and ureter, unspecified: Secondary | ICD-10-CM | POA: Diagnosis not present

## 2021-07-11 DIAGNOSIS — W19XXXD Unspecified fall, subsequent encounter: Secondary | ICD-10-CM | POA: Diagnosis present

## 2021-07-11 LAB — COMPREHENSIVE METABOLIC PANEL
ALT: 15 U/L (ref 0–44)
AST: 16 U/L (ref 15–41)
Albumin: 2 g/dL — ABNORMAL LOW (ref 3.5–5.0)
Alkaline Phosphatase: 261 U/L — ABNORMAL HIGH (ref 38–126)
Anion gap: 8 (ref 5–15)
BUN: 114 mg/dL — ABNORMAL HIGH (ref 8–23)
CO2: 20 mmol/L — ABNORMAL LOW (ref 22–32)
Calcium: 7.9 mg/dL — ABNORMAL LOW (ref 8.9–10.3)
Chloride: 111 mmol/L (ref 98–111)
Creatinine, Ser: 1.28 mg/dL — ABNORMAL HIGH (ref 0.44–1.00)
GFR, Estimated: 43 mL/min — ABNORMAL LOW (ref >60)
Glucose, Bld: 75 mg/dL (ref 70–99)
Potassium: 3.8 mmol/L (ref 3.5–5.1)
Sodium: 139 mmol/L (ref 135–145)
Total Bilirubin: 1.1 mg/dL (ref 0.3–1.2)
Total Protein: 4.6 g/dL — ABNORMAL LOW (ref 6.5–8.1)

## 2021-07-11 LAB — URINE CULTURE: Culture: NO GROWTH

## 2021-07-11 LAB — T4, FREE: Free T4: 0.88 ng/dL (ref 0.61–1.12)

## 2021-07-11 LAB — BLOOD CULTURE ID PANEL (REFLEXED) - BCID2

## 2021-07-11 LAB — CBC
HCT: 22.3 % — ABNORMAL LOW (ref 36.0–46.0)
Hemoglobin: 7.1 g/dL — ABNORMAL LOW (ref 12.0–15.0)
MCH: 30 pg (ref 26.0–34.0)
MCHC: 31.8 g/dL (ref 30.0–36.0)
MCV: 94.1 fL (ref 80.0–100.0)
Platelets: 83 10*3/uL — ABNORMAL LOW (ref 150–400)
RBC: 2.37 MIL/uL — ABNORMAL LOW (ref 3.87–5.11)
RDW: 22.8 % — ABNORMAL HIGH (ref 11.5–15.5)
WBC: 6.5 10*3/uL (ref 4.0–10.5)
nRBC: 0 % (ref 0.0–0.2)

## 2021-07-11 LAB — APTT
aPTT: 45 seconds — ABNORMAL HIGH (ref 24–36)
aPTT: 44 seconds — ABNORMAL HIGH (ref 24–36)

## 2021-07-11 LAB — PROTIME-INR
INR: 2.2 — ABNORMAL HIGH (ref 0.8–1.2)
Prothrombin Time: 24.5 seconds — ABNORMAL HIGH (ref 11.4–15.2)

## 2021-07-11 LAB — TSH: TSH: 8.603 u[IU]/mL — ABNORMAL HIGH (ref 0.350–4.500)

## 2021-07-11 LAB — HAPTOGLOBIN: Haptoglobin: 49 mg/dL (ref 42–346)

## 2021-07-11 MED ORDER — PHYTONADIONE 5 MG PO TABS
10.0000 mg | ORAL_TABLET | Freq: Every day | ORAL | Status: AC
  Administered 2021-07-11 – 2021-07-13 (×3): 10 mg via ORAL
  Filled 2021-07-11 (×4): qty 2

## 2021-07-11 MED ORDER — IOHEXOL 9 MG/ML PO SOLN
500.0000 mL | ORAL | Status: AC
  Administered 2021-07-11: 13:00:00 500 mL via ORAL

## 2021-07-11 MED ORDER — CHLORHEXIDINE GLUCONATE CLOTH 2 % EX PADS
6.0000 | MEDICATED_PAD | Freq: Every day | CUTANEOUS | Status: DC
  Administered 2021-07-11 – 2021-07-30 (×18): 6 via TOPICAL

## 2021-07-11 MED ORDER — IOHEXOL 9 MG/ML PO SOLN
ORAL | Status: AC
  Administered 2021-07-11: 11:00:00 500 mL via ORAL
  Filled 2021-07-11: qty 1000

## 2021-07-11 MED ORDER — SODIUM CHLORIDE (PF) 0.9 % IJ SOLN
INTRAMUSCULAR | Status: AC
  Filled 2021-07-11: qty 50

## 2021-07-11 MED ORDER — IOHEXOL 350 MG/ML SOLN
75.0000 mL | Freq: Once | INTRAVENOUS | Status: AC | PRN
  Administered 2021-07-11: 14:00:00 65 mL via INTRAVENOUS

## 2021-07-11 NOTE — Progress Notes (Signed)
PHARMACY - PHYSICIAN COMMUNICATION CRITICAL VALUE ALERT - BLOOD CULTURE IDENTIFICATION (BCID)  Sally Wise is an 75 y.o. female who presented to [facility] on 07/10/2021 with a chief complaint of generalized weakness. Two (from the same set) of three blood culture bottles has GPC in clusters (BCID = staph epi; mec A detected)  Name of physician (or Provider) Contacted: Dr. Butler Denmark  Current antibiotics: none. Ceftriaxone d/ced this morning  Changes to prescribed antibiotics recommended:  - hold off on abx at this time since pt is stable  Results for orders placed or performed during the hospital encounter of 07/10/21  Blood Culture ID Panel (Reflexed) (Collected: 07/10/2021  3:50 AM)  Result Value Ref Range   Enterococcus faecalis NOT DETECTED NOT DETECTED   Enterococcus Faecium NOT DETECTED NOT DETECTED   Listeria monocytogenes NOT DETECTED NOT DETECTED   Staphylococcus species DETECTED (A) NOT DETECTED   Staphylococcus aureus (BCID) NOT DETECTED NOT DETECTED   Staphylococcus epidermidis DETECTED (A) NOT DETECTED   Staphylococcus lugdunensis NOT DETECTED NOT DETECTED   Streptococcus species NOT DETECTED NOT DETECTED   Streptococcus agalactiae NOT DETECTED NOT DETECTED   Streptococcus pneumoniae NOT DETECTED NOT DETECTED   Streptococcus pyogenes NOT DETECTED NOT DETECTED   A.calcoaceticus-baumannii NOT DETECTED NOT DETECTED   Bacteroides fragilis NOT DETECTED NOT DETECTED   Enterobacterales NOT DETECTED NOT DETECTED   Enterobacter cloacae complex NOT DETECTED NOT DETECTED   Escherichia coli NOT DETECTED NOT DETECTED   Klebsiella aerogenes NOT DETECTED NOT DETECTED   Klebsiella oxytoca NOT DETECTED NOT DETECTED   Klebsiella pneumoniae NOT DETECTED NOT DETECTED   Proteus species NOT DETECTED NOT DETECTED   Salmonella species NOT DETECTED NOT DETECTED   Serratia marcescens NOT DETECTED NOT DETECTED   Haemophilus influenzae NOT DETECTED NOT DETECTED   Neisseria meningitidis NOT  DETECTED NOT DETECTED   Pseudomonas aeruginosa NOT DETECTED NOT DETECTED   Stenotrophomonas maltophilia NOT DETECTED NOT DETECTED   Candida albicans NOT DETECTED NOT DETECTED   Candida auris NOT DETECTED NOT DETECTED   Candida glabrata NOT DETECTED NOT DETECTED   Candida krusei NOT DETECTED NOT DETECTED   Candida parapsilosis NOT DETECTED NOT DETECTED   Candida tropicalis NOT DETECTED NOT DETECTED   Cryptococcus neoformans/gattii NOT DETECTED NOT DETECTED   Methicillin resistance mecA/C DETECTED (A) NOT DETECTED    Lucia Gaskins 07/11/2021  12:51 PM

## 2021-07-11 NOTE — Consult Note (Addendum)
WOC Nurse Consult Note: Patient receiving care in [id number]. Spouse and primary RN in room at time of my assessment. Reason for Consult: ankle and sacrum wounds Wound type: Left medial ankle is at a surgical incision site. It measures 1.5 cm x 1 cm and is dry and stable.  For this I have ordered application of iodine every shift.  The former DTPIs to the coccyx area are now healing, partial thickness wounds with bed wound beds--equivalent to a stage 2. The largest measures 1 cm x 0.5 cm x 0.1 cm.  For these the use of foam dressings, change every 3 days, is appropriate.  Today she also has a linear DTPI on the left upper posterior thigh that measures 1 cm x 13 cm.  It is consistent with something caused by linen folds.  I also added an order for InterDry for the abdominal fold.  The patient is on a bariatric bed with low air loss mattress.  She has total body anasarca and is weeping. Pressure Injury POA: Yes Measurement: Wound bed: see above Drainage (amount, consistency, odor)  Periwound: intact Dressing procedure/placement/frequency: Use InterDry in the abdominal fold. Use [employer] [phone] to order the product from [employer]. Follow these instructions for use: Measure and cut length of InterDry to fit in skin folds that have skin breakdown  Tuck InterDry fabric into skin folds in a single layer, allow for 2 inches of overhang from skin edges to allow for wicking to occur May remove to bathe; dry area thoroughly and then tuck into affected areas again  DO NOT APPLY any creams or ointments when using InterDry DO NOT THROW AWAY FOR 5 DAYS unless soiled with stool DO NOT [facility] product, this will inactivate the silver in the material  New sheet of Interdry should be applied after 5 days of use if patient continues to have skin breakdown.  Monitor the wound area(s) for worsening of condition such as: Signs/symptoms of infection,  Increase in size,  Development of or worsening of  odor, Development of pain, or increased pain at the affected locations.  Notify the medical team if any of these develop.  Thank you for the consult.  Discussed plan of care with the patient and bedside nurse.  WOC nurse will not follow at this time.  Please re-consult the WOC team if needed.  Helmut Muster, RN, MSN, CWOCN, CNS-BC, pager [phone]

## 2021-07-11 NOTE — Plan of Care (Signed)
  Problem: Pain Managment: Goal: General experience of comfort will improve Outcome: Progressing   Problem: Safety: Goal: Ability to remain free from injury will improve Outcome: Progressing   

## 2021-07-11 NOTE — Progress Notes (Signed)
PROGRESS NOTE    Sally Wise   [id number]  DOB: [birth year]  DOA: 07/10/2021 PCP: [name], MD   Brief Narrative:  Sally Wise is a 75 y.o. female with medical history significant of pA fib, hypothyroidism, chronic thrombocytopenia, chronic HFpEF, HLD, HTN, depression. Presenting with weakness and lethargy. She fractured her ankle in October 2022 and since then has been living at a SNF.  She has barely been eating since then and is still unable to walk.  She was admitted on 12/12 for dehydration, hypotension and AKI. She returns now for the same.  She is found to have severe anasarca they are unable to tell if she has actually lost any weight. She was given IV fluids and albumin in the ED and admitted for further work-up.  Subjective: Continues to have poor oral intake and feels weak.    Assessment & Plan:   Principal Problem:   Anasarca, hypo-albuminemia -Elevated BUN/creatinine ratio seems to suggest that she is intravascularly depleted while having severe anasarca - Her elevated lactic acid also points to the same -She has received IV fluids with albumin in the ED-hold further IV fluids - Encourage oral intake -closely monitoring I & O's while in the hospital -Patient is unable to move on her own-we are unable to determine how much urine output she has as she is incontinent of urine and stool - I have asked the RN to place Foley catheter to accurately follow urine output-if urine output is dropping, she will need more IV fluids - Suspect anasarca secondary to poor oral intake and hypoalbuminemia -we will however check a 2D echo to see if she has right heart failure-she is laying flat, is not hypoxic and had no pulm edema on the CXR and therefore I am not concerned about left heart failure  -Check TSH and free T4 for myxedema  Active Problems: Poor oral intake -Ongoing for many months-no other significant GI symptoms other than occasional nausea - Stool  Hemoccult is positive - We will do CT scan of chest abdomen pelvis to see if we can identify any cancer -Underwent a CT abdomen pelvis without contrast earlier this month which revealed small pleural effusions, scattered hypodensities in the liver periportal adenopathy, proctitis versus congestive wall thickening or less likely infiltrative disease and overall appears to be consistent with fluid overload - I have asked GI to evaluate for heme positive stool/possible colon cancer or PUD - have requested a nutrition consult and encourage the patient to eat even if she has no appetite  Elevated INR - Patient is not on any anticoagulation and INR is 2.1-2.2 - Start vitamin K supplementation and follow    Morbid obesity ([place]) Body mass index is 45.46 kg/m.  Chronic kidney disease stage IIIa - Creatinine in October was as low as 0.9 and 1.0 which is around where she is now placing her in CKD stage IIIa  Normocytic anemia - Hemoglobin of 10 when admitted has dropped to 7.  1 after IV fluids -Continue to follow-obtain anemia panel  Thrombocytopenia - Appears to be chronic   Time spent in minutes: 45 DVT prophylaxis: Place TED hose Start: 07/10/21 1808  Code Status: full code Family Communication: husband Level of Care: Level of care: Med-Surg Disposition Plan:  Status is: Inpatient  Remains inpatient appropriate because: work up cause of dehydration      Consultants:  GI Procedures:  none Antimicrobials:  Anti-infectives (From admission, onward)    Start  Dose/Rate Route Frequency Ordered Stop   07/11/21 0800  cefTRIAXone (ROCEPHIN) 2 g in sodium chloride 0.9 % 100 mL IVPB  Status:  Discontinued        2 g 200 mL/hr over 30 Minutes Intravenous Every 24 hours 07/10/21 1807 07/11/21 0906   07/10/21 0700  cefTRIAXone (ROCEPHIN) 2 g in sodium chloride 0.9 % 100 mL IVPB        2 g 200 mL/hr over 30 Minutes Intravenous  Once 07/10/21 0654 07/10/21 0816         Objective: Vitals:   07/10/21 2231 07/10/21 2338 07/11/21 0232 07/11/21 0620  BP: 104/67  (!) 97/59 (!) 104/58  Pulse: 83  82 81  Resp: 18  18   Temp: 97.6 F (36.4 C) (!) 97.3 F (36.3 C) 98.3 F (36.8 C) (!) 97.5 F (36.4 C)  TempSrc: Axillary Rectal Oral Rectal  SpO2: 99%  100% 100%  Weight:    135.6 kg  Height:        Intake/Output Summary (Last 24 hours) at 07/11/2021 1347 Last data filed at 07/11/2021 0112 Gross per 24 hour  Intake 560 ml  Output --  Net 560 ml   Filed Weights   07/10/21 0338 07/11/21 0620  Weight: (!) 144.2 kg 135.6 kg    Examination: General exam: Appears comfortable  HEENT: PERRLA, oral mucosa moist, no sclera icterus or thrush Respiratory system: Clear to auscultation. Respiratory effort normal. Cardiovascular system: S1 & S2 heard, RRR.   Gastrointestinal system: Abdomen soft, non-tender, nondistended. Normal bowel sounds. Central nervous system: Alert and oriented.mild weakness in both arms, unable to lift legs off the bed  Extremities: No cyanosis, clubbing - severe anasarca Skin: No rashes or ulcers Psychiatry:  Mood & affect appropriate.     Data Reviewed: I have personally reviewed following labs and imaging studies  CBC: Recent Labs  Lab 07/10/21 0350 07/10/21 1255 07/10/21 2112 07/11/21 0350  WBC 8.8 7.4  --  6.5  NEUTROABS 4.6 4.0  --   --   HGB 10.0* 9.1* 8.5* 7.1*  HCT 32.0* 28.5* 26.1* 22.3*  MCV 95.8 93.8  --  94.1  PLT PLATELET CLUMPS NOTED ON SMEAR, UNABLE TO ESTIMATE 108*  --  83*   Basic Metabolic Panel: Recent Labs  Lab 07/10/21 0350 07/11/21 0350  NA 140 139  K 4.2 3.8  CL 109 111  CO2 21* 20*  GLUCOSE 88 75  BUN 110* 114*  CREATININE 1.17* 1.28*  CALCIUM 8.6* 7.9*   GFR: Estimated Creatinine Clearance: 53.8 mL/min (A) (by C-G formula based on SCr of 1.28 mg/dL (H)). Liver Function Tests: Recent Labs  Lab 07/10/21 0350 07/11/21 0350  AST 16 16  ALT 19 15  ALKPHOS 319* 261*  BILITOT  1.2 1.1  PROT 5.5* 4.6*  ALBUMIN 2.2* 2.0*   No results for input(s): LIPASE, AMYLASE in the last 168 hours. No results for input(s): AMMONIA in the last 168 hours. Coagulation Profile: Recent Labs  Lab 07/10/21 0350 07/11/21 0515  INR 2.1* 2.2*   Cardiac Enzymes: No results for input(s): CKTOTAL, CKMB, CKMBINDEX, TROPONINI in the last 168 hours. BNP (last 3 results) No results for input(s): PROBNP in the last 8760 hours. HbA1C: No results for input(s): HGBA1C in the last 72 hours. CBG: No results for input(s): GLUCAP in the last 168 hours. Lipid Profile: No results for input(s): CHOL, HDL, LDLCALC, TRIG, CHOLHDL, LDLDIRECT in the last 72 hours. Thyroid Function Tests: No results for input(s): TSH, T4TOTAL, FREET4,  T3FREE, THYROIDAB in the last 72 hours. Anemia Panel: No results for input(s): VITAMINB12, FOLATE, FERRITIN, TIBC, IRON, RETICCTPCT in the last 72 hours. Urine analysis:    Component Value Date/Time   COLORURINE YELLOW 07/10/2021 0915   APPEARANCEUR CLEAR 07/10/2021 0915   LABSPEC 1.014 07/10/2021 0915   PHURINE 5.0 07/10/2021 0915   GLUCOSEU NEGATIVE 07/10/2021 0915   HGBUR NEGATIVE 07/10/2021 0915   BILIRUBINUR NEGATIVE 07/10/2021 0915   BILIRUBINUR neg 04/08/2016 0916   KETONESUR NEGATIVE 07/10/2021 0915   PROTEINUR NEGATIVE 07/10/2021 0915   UROBILINOGEN negative 04/08/2016 0916   UROBILINOGEN 0.2 01/15/2013 1538   NITRITE NEGATIVE 07/10/2021 0915   LEUKOCYTESUR NEGATIVE 07/10/2021 0915   Sepsis Labs: @LABRCNTIP (procalcitonin:4,lacticidven:4) ) Recent Results (from the past 240 hour(s))  Blood Culture (routine x 2)     Status: None (Preliminary result)   Collection Time: 07/10/21  3:50 AM   Specimen: BLOOD  Result Value Ref Range Status   Specimen Description   Final    BLOOD LEFT ANTECUBITAL Performed at [facility], [place] [address], [place], [place] [address]    Special Requests   Final    BOTTLES DRAWN AEROBIC AND  ANAEROBIC Blood Culture adequate volume Performed at [facility] [address], [place], [place] [address]    Culture  Setup Time   Final    GRAM POSITIVE COCCI IN CLUSTERS IN BOTH AEROBIC AND ANAEROBIC BOTTLES CRITICAL RESULT CALLED TO, READ BACK BY AND VERIFIED WITH: J. LEGGE 07/11/21 1154 [place] Performed at [facility], [address], [place], [place] [address]    Culture GRAM POSITIVE COCCI  Final   Report Status PENDING  Incomplete  Blood Culture ID Panel (Reflexed)     Status: Abnormal   Collection Time: 07/10/21  3:50 AM  Result Value Ref Range Status   Enterococcus faecalis NOT DETECTED NOT DETECTED Final   Enterococcus Faecium NOT DETECTED NOT DETECTED Final   Listeria monocytogenes NOT DETECTED NOT DETECTED Final   Staphylococcus species DETECTED (A) NOT DETECTED Final    Comment: CRITICAL RESULT CALLED TO, READ BACK BY AND VERIFIED WITH: J. LEGGE 07/11/21 1154 FH    Staphylococcus aureus (BCID) NOT DETECTED NOT DETECTED Final   Staphylococcus epidermidis DETECTED (A) NOT DETECTED Final    Comment: Methicillin (oxacillin) resistant coagulase negative staphylococcus. Possible blood culture contaminant (unless isolated from more than one blood culture draw or clinical case suggests pathogenicity). No antibiotic treatment is indicated for blood  culture contaminants. CRITICAL RESULT CALLED TO, READ BACK BY AND VERIFIED WITH: J. LEGGE 07/11/21 1154 FH    Staphylococcus lugdunensis NOT DETECTED NOT DETECTED Final   Streptococcus species NOT DETECTED NOT DETECTED Final   Streptococcus agalactiae NOT DETECTED NOT DETECTED Final   Streptococcus pneumoniae NOT DETECTED NOT DETECTED Final   Streptococcus pyogenes NOT DETECTED NOT DETECTED Final   A.calcoaceticus-baumannii NOT DETECTED NOT DETECTED Final   Bacteroides fragilis NOT DETECTED NOT DETECTED Final   Enterobacterales NOT DETECTED NOT DETECTED Final   Enterobacter cloacae complex NOT DETECTED NOT  DETECTED Final   Escherichia coli NOT DETECTED NOT DETECTED Final   Klebsiella aerogenes NOT DETECTED NOT DETECTED Final   Klebsiella oxytoca NOT DETECTED NOT DETECTED Final   Klebsiella pneumoniae NOT DETECTED NOT DETECTED Final   Proteus species NOT DETECTED NOT DETECTED Final   Salmonella species NOT DETECTED NOT DETECTED Final   Serratia marcescens NOT DETECTED NOT DETECTED Final   Haemophilus influenzae NOT DETECTED NOT DETECTED Final   Neisseria  meningitidis NOT DETECTED NOT DETECTED Final   Pseudomonas aeruginosa NOT DETECTED NOT DETECTED Final   Stenotrophomonas maltophilia NOT DETECTED NOT DETECTED Final   Candida albicans NOT DETECTED NOT DETECTED Final   Candida auris NOT DETECTED NOT DETECTED Final   Candida glabrata NOT DETECTED NOT DETECTED Final   Candida krusei NOT DETECTED NOT DETECTED Final   Candida parapsilosis NOT DETECTED NOT DETECTED Final   Candida tropicalis NOT DETECTED NOT DETECTED Final   Cryptococcus neoformans/gattii NOT DETECTED NOT DETECTED Final   Methicillin resistance mecA/C DETECTED (A) NOT DETECTED Final    Comment: CRITICAL RESULT CALLED TO, READ BACK BY AND VERIFIED WITH: J. LEGGE 07/11/21 1154 FH Performed at [facility], [address], [place], [place] [address]   Urine Culture     Status: None   Collection Time: 07/10/21  9:15 AM   Specimen: In/Out Cath Urine  Result Value Ref Range Status   Specimen Description   Final    IN/OUT CATH URINE Performed at [facility] [address], [place], [place] [address]    Special Requests   Final    NONE Performed at [facility], [place] [address], [place], [place] [address]    Culture   Final    NO GROWTH Performed at [facility], [place] [address], [place], [place] [address]    Report Status 07/11/2021 FINAL  Final  Blood Culture (routine x 2)     Status: None (Preliminary result)   Collection Time: 07/10/21  7:09 PM   Specimen: BLOOD  Result  Value Ref Range Status   Specimen Description   Final    BLOOD SITE NOT SPECIFIED Performed at [facility] [address] [place], [place] [address]    Special Requests   Final    BOTTLES DRAWN AEROBIC ONLY Blood Culture results may not be optimal due to an inadequate volume of blood received in culture bottles Performed at [facility] [address] [place], [place] [address]    Culture   Final    NO GROWTH < 12 HOURS Performed at [facility] [address], [place], [place] [address]    Report Status PENDING  Incomplete         Radiology Studies: DG Chest Port 1 View  Result Date: 07/10/2021 CLINICAL DATA:  75 year old female with possible sepsis. EXAM: PORTABLE CHEST 1 VIEW COMPARISON:  Chest x-ray 06/29/2021. FINDINGS: Opacity at the left base obscuring the left hemidiaphragm with blunting of the left costophrenic sulcus. Subtle blunting of the right costophrenic sulcus. No pneumothorax. No evidence of pulmonary edema. Heart size is borderline to mildly enlarged. The patient is rotated to the left on today's exam, resulting in distortion of the mediastinal contours and reduced diagnostic sensitivity and specificity for mediastinal pathology. IMPRESSION: 1. Atelectasis and/or consolidation in the left lower lobe with small left pleural effusion. 2. Trace right pleural effusion. 3. Borderline cardiomegaly. Electronically Signed   By: Vinnie Langton M.D.   On: 07/10/2021 05:49      Scheduled Meds:  (feeding supplement) PROSource Plus  30 mL Oral BID BM   ascorbic acid  500 mg Oral BID   Chlorhexidine Gluconate Cloth  6 each Topical Daily   cholecalciferol  5,000 Units Oral Daily   escitalopram  5 mg Oral QHS   folic acid  1 mg Oral Daily   lactobacillus acidophilus & bulgar  2 tablet Oral TID   levothyroxine  125 mcg Oral Q0600   phytonadione  10 mg Oral Daily   zinc sulfate  220 mg Oral Daily   Continuous Infusions:    LOS: 0 days      Debbe Odea, MD [facility] Pager: [email or web address] 07/11/2021, 1:47 PM

## 2021-07-11 NOTE — Plan of Care (Signed)
°  Problem: Education: Goal: Knowledge of General Education information will improve Description: Including pain rating scale, medication(s)/side effects and non-pharmacologic comfort measures Outcome: Progressing   Problem: Clinical Measurements: Goal: Ability to maintain clinical measurements within normal limits will improve Outcome: Progressing   Problem: Activity: Goal: Risk for activity intolerance will decrease Outcome: Not Progressing   Problem: Nutrition: Goal: Adequate nutrition will be maintained Outcome: Not Progressing

## 2021-07-11 NOTE — H&P (View-Only) (Signed)
[facility] Gastroenterology Consult  Referring Provider: Dr. Ardeth Sportsman hospitalist Primary Care Physician:  [name], MD Primary Gastroenterologist: Dr. Cristina Gong  Reason for Consultation: FOBT positive stool, anemia, loss of appetite, malnutrition, anasarca  HPI: Sally Wise is a 75 y.o. female  Patient is bedbound since her ankle surgery in October and complains of nausea, reduced appetite, epigastric discomfort, bloating. She complains of overall feeling weak.  She has not noticed any blood in stool or black stools. Recently she has noted any change in her bowel movements.  Past medical history of morbid obesity, atrial fibrillation, hypothyroidism, chronic heart failure, dyslipidemia, depression and hypertension, admitted on 05/10/2021 with weakness and lethargy. Admitted with anasarca, hypoalbuminemia, poor oral intake, occasional nausea Resident at a skilled nursing facility.  Flexible sigmoidoscopy 03/29/2021 for persistent anal irritation, mucoid rectal discharge: Unremarkable except diverticulosis and small hemorrhoids, unremarkable biopsies from rectum  Colonoscopy 2015: Pandiverticulosis, small colonic polyps-tubular adenoma, repeat recommended in 5 years  Past Medical History:  Diagnosis Date   Hematuria    evaluation with Dr. Joelyn Oms   Hypothyroidism 06/2010   Kidney stone [id number]   Lichen sclerosus et atrophicus 2/13   biopsy proven   LVH (left ventricular hypertrophy)    Morbid obesity (HCC)    PAF (paroxysmal atrial fibrillation) ([place])    a. Remote hx, re-established care 01/2013 with Dr. Johnsie Cancel for recurrence. 2D echo 01/2013: mod LVH, EF 55-65%, no RWMA, grade 1 d/d. b. 06/2014: started on Xarelto.    Thrombocytopenia ([place])    Vitamin D deficiency 06/2010   Wears glasses     Past Surgical History:  Procedure Laterality Date   BREAST EXCISIONAL BIOPSY  2010   left--was a vascular lesion   CARPAL TUNNEL RELEASE Left 08/09/2013   Procedure: LEFT  CARPAL TUNNEL RELEASE;  Surgeon: Cammie Sickle., MD;  Location: [place];  Service: Orthopedics;  Laterality: Left;   CATARACT EXTRACTION     COLONOSCOPY  04/2003       DILATION AND CURETTAGE OF UTERUS     HYSTEROSCOPY WITH D & C  8/99   ORIF ANKLE FRACTURE Left 05/07/2021   Procedure: OPEN REDUCTION INTERNAL FIXATION (ORIF) ANKLE FRACTURE;  Surgeon: Wylene Simmer, MD;  Location: [facility];  Service: Orthopedics;  Laterality: Left;   REPLACEMENT TOTAL KNEE Bilateral 12/2006  07/2007    TONSILLECTOMY     TRIGGER FINGER RELEASE Left 08/09/2013   Procedure: LEFT A1/A2 PULLEY RELEASE;  Surgeon: Cammie Sickle., MD;  Location: [place];  Service: Orthopedics;  Laterality: Left;   TUBAL LIGATION  5/77    Prior to Admission medications   Medication Sig Start Date End Date Taking? Authorizing Provider  ascorbic acid (VITAMIN C) 500 MG tablet Take 1 tablet (500 mg total) by mouth 2 (two) times daily. 06/28/21  Yes Lavina Hamman, MD  bisacodyl 5 MG EC tablet Take 5 mg by mouth daily as needed for moderate constipation.   Yes [provider]  Cholecalciferol (VITAMIN D3) 5000 UNITS TABS Take 1 tablet by mouth daily.   Yes [provider]  escitalopram (LEXAPRO) 5 MG tablet Take 5 mg by mouth at bedtime. 07/02/21  Yes [provider]  ferrous sulfate 325 (65 FE) MG tablet Take 325 mg by mouth daily with breakfast.   Yes [provider]  fludrocortisone (FLORINEF) 0.1 MG tablet Take 100 mcg by mouth daily. 07/08/21  Yes [provider]  folic acid (FOLVITE) 1 MG tablet Take 1  tablet (1 mg total) by mouth daily. 03/17/21  Yes Mercy Riding, MD  HYDROcodone-acetaminophen (NORCO/VICODIN) 5-325 MG tablet Take 1 tablet by mouth every 6 (six) hours as needed for moderate pain or severe pain. 06/28/21  Yes Lavina Hamman, MD  lactobacillus acidophilus & bulgar (LACTINEX) chewable tablet Chew 2 tablets by mouth 3 (three) times  daily. 06/28/21  Yes Lavina Hamman, MD  levothyroxine (EUTHYROX) 125 MCG tablet Take 1 tablet (125 mcg total) by mouth daily before breakfast. 06/29/21  Yes Lavina Hamman, MD  nutrition supplement, JUVEN, (JUVEN) PACK Take 1 packet by mouth 2 (two) times daily between meals. Patient taking differently: Take 1 packet by mouth 2 (two) times daily. Midday and bedtime 06/28/21  Yes Lavina Hamman, MD  Nutritional Supplements (,FEEDING SUPPLEMENT, PROSOURCE PLUS) liquid Take 30 mLs by mouth 2 (two) times daily between meals. 06/28/21  Yes Lavina Hamman, MD  ondansetron (ZOFRAN) 4 MG tablet Take 4 mg by mouth every 8 (eight) hours as needed for nausea or vomiting.   Yes [provider]  zinc sulfate 220 (50 Zn) MG capsule Take 1 capsule (220 mg total) by mouth daily. 06/29/21  Yes Lavina Hamman, MD  feeding supplement (ENSURE ENLIVE / ENSURE PLUS) LIQD Take 237 mLs by mouth 2 (two) times daily between meals. Patient not taking: Reported on 07/10/2021 06/28/21   Lavina Hamman, MD  mirtazapine (REMERON) 15 MG tablet Take 1 tablet (15 mg total) by mouth at bedtime. Patient not taking: Reported on 07/10/2021 06/28/21   Lavina Hamman, MD  Nystatin (GERHARDT'S BUTT CREAM) CREA Apply 1 application topically 2 (two) times daily. Patient not taking: Reported on 07/10/2021 06/28/21   Lavina Hamman, MD    Current Facility-Administered Medications  Medication Dose Route Frequency Provider Last Rate Last Admin   (feeding supplement) PROSource Plus liquid 30 mL  30 mL Oral BID BM Marylyn Ishihara, Tyrone A, DO   30 mL at 07/11/21 1012   acetaminophen (TYLENOL) tablet 650 mg  650 mg Oral Q6H PRN Marylyn Ishihara, Tyrone A, DO       Or   acetaminophen (TYLENOL) suppository 650 mg  650 mg Rectal Q6H PRN Marylyn Ishihara, Tyrone A, DO       ascorbic acid (VITAMIN C) tablet 500 mg  500 mg Oral BID Marylyn Ishihara, Tyrone A, DO   500 mg at 07/11/21 1013   bisacodyl (DULCOLAX) EC tablet 5 mg  5 mg Oral Daily PRN Marylyn Ishihara, Tyrone A, DO        Chlorhexidine Gluconate Cloth 2 % PADS 6 each  6 each Topical Daily Debbe Odea, MD   6 each at 07/11/21 1214   cholecalciferol (VITAMIN D) tablet 5,000 Units  5,000 Units Oral Daily Marylyn Ishihara, Tyrone A, DO   5,000 Units at 07/11/21 1013   escitalopram (LEXAPRO) tablet 5 mg  5 mg Oral QHS Kyle, Tyrone A, DO   5 mg at [id number]   folic acid (FOLVITE) tablet 1 mg  1 mg Oral Daily Kyle, Tyrone A, DO   1 mg at 07/11/21 1013   HYDROcodone-acetaminophen (NORCO/VICODIN) 5-325 MG per tablet 1 tablet  1 tablet Oral Q6H PRN Marylyn Ishihara, Tyrone A, DO       lactobacillus acidophilus & bulgar (LACTINEX) chewable tablet 2 tablet  2 tablet Oral TID Cherylann Ratel A, DO   2 tablet at 07/10/21 2049   levothyroxine (SYNTHROID) tablet 125 mcg  125 mcg Oral Q0600 Kyle, Tyrone A, DO   125 mcg  at 07/11/21 0617   ondansetron (ZOFRAN) tablet 4 mg  4 mg Oral Q8H PRN Marylyn Ishihara, Tyrone A, DO       phytonadione (VITAMIN K) tablet 10 mg  10 mg Oral Daily Rizwan, Saima, MD       sodium chloride (PF) 0.9 % injection            zinc sulfate capsule 220 mg  220 mg Oral Daily Kyle, Tyrone A, DO   220 mg at 07/11/21 1014    Allergies as of 07/10/2021   (No Known Allergies)    Family History  Problem Relation Age of Onset   Hypertension Mother    Kidney disease Mother    Alzheimer's disease Father    Diabetes Brother    Hypertension Brother    Spina bifida Daughter     Social History   Socioeconomic History   Marital status: Married    Spouse name: Not on file   Number of children: Not on file   Years of education: Not on file   Highest education level: Not on file  Occupational History   Not on file  Tobacco Use   Smoking status: Never   Smokeless tobacco: Never  Vaping Use   Vaping Use: Never used  Substance and Sexual Activity   Alcohol use: No   Drug use: No   Sexual activity: Yes    Partners: Male    Birth control/protection: Surgical  Other Topics Concern   Not on file  Social History Narrative   Not on file    Social Determinants of Health   Financial Resource Strain: Not on file  Food Insecurity: Not on file  Transportation Needs: Not on file  Physical Activity: Not on file  Stress: Not on file  Social Connections: Not on file  Intimate Partner Violence: Not on file    Review of Systems: Positive for: GI: Described in detail in HPI.    Gen: Denies any fever, chills, rigors, night sweats, anorexia, fatigue, weakness, malaise, involuntary weight loss, and sleep disorder CV: Denies chest pain, angina, palpitations, syncope, orthopnea, PND, peripheral edema, and claudication. Resp: Denies dyspnea, cough, sputum, wheezing, coughing up blood. GU : Denies urinary burning, blood in urine, urinary frequency, urinary hesitancy, nocturnal urination, and urinary incontinence. MS: Left ankle pain, patient is bedbound Derm: Denies rash, itching, oral ulcerations, hives, unhealing ulcers.  Psych: Denies depression, anxiety, memory loss, suicidal ideation, hallucinations,  and confusion. Heme: Denies bruising, bleeding, and enlarged lymph nodes. Neuro:  Denies any headaches, dizziness, paresthesias. Endo:  hypo-thyroid,  denies any problems with DM, adrenal function.  Physical Exam: Vital signs in last 24 hours: Temp:  [96.4 F (35.8 C)-98.3 F (36.8 C)] 97.5 F (36.4 C) (12/29 0620) Pulse Rate:  [74-83] 81 (12/29 0620) Resp:  [18] 18 (12/29 0232) BP: (96-105)/(58-75) 104/58 (12/29 0620) SpO2:  [99 %-100 %] 100 % (12/29 0620) Weight:  [135.6 kg] 135.6 kg (12/29 0620) Last BM Date: 07/11/21  General:   Alert,  Well-developed, obese, pleasant and cooperative in NAD Head:  Normocephalic and atraumatic. Eyes:  Sclera clear, no icterus.   Prominent pallor Ears:  Normal auditory acuity. Nose:  No deformity, discharge,  or lesions. Mouth:  No deformity or lesions.  Oropharynx pink & moist. Lungs:  Clear throughout to auscultation.   No wheezes, crackles, or rhonchi. No acute distress. Heart:   Regular rate and rhythm; no murmurs, clicks, rubs,  or gallops. Extremities: Bipedal pitting edema extending all the way to thighs  Neurologic:  Alert and  oriented x4;  grossly normal neurologically. Skin: left ankle scab noted from recent ankle surgery. Psych: Depressed   abdomen:  Soft, nontender and nondistended. No masses, hepatosplenomegaly or hernias noted. Normal bowel sounds, without guarding, and without rebound.         Lab Results: Recent Labs    07/10/21 0350 07/10/21 1255 07/10/21 2112 07/11/21 0350  WBC 8.8 7.4  --  6.5  HGB 10.0* 9.1* 8.5* 7.1*  HCT 32.0* 28.5* 26.1* 22.3*  PLT PLATELET CLUMPS NOTED ON SMEAR, UNABLE TO ESTIMATE 108*  --  83*   BMET Recent Labs    07/10/21 0350 07/11/21 0350  NA 140 139  K 4.2 3.8  CL 109 111  CO2 21* 20*  GLUCOSE 88 75  BUN 110* 114*  CREATININE 1.17* 1.28*  CALCIUM 8.6* 7.9*   LFT Recent Labs    07/11/21 0350  PROT 4.6*  ALBUMIN 2.0*  AST 16  ALT 15  ALKPHOS 261*  BILITOT 1.1   PT/INR Recent Labs    07/10/21 0350 07/11/21 0515  LABPROT 23.7* 24.5*  INR 2.1* 2.2*    Studies/Results: DG Chest Port 1 View  Result Date: 07/10/2021 CLINICAL DATA:  75 year old female with possible sepsis. EXAM: PORTABLE CHEST 1 VIEW COMPARISON:  Chest x-ray 06/29/2021. FINDINGS: Opacity at the left base obscuring the left hemidiaphragm with blunting of the left costophrenic sulcus. Subtle blunting of the right costophrenic sulcus. No pneumothorax. No evidence of pulmonary edema. Heart size is borderline to mildly enlarged. The patient is rotated to the left on today's exam, resulting in distortion of the mediastinal contours and reduced diagnostic sensitivity and specificity for mediastinal pathology. IMPRESSION: 1. Atelectasis and/or consolidation in the left lower lobe with small left pleural effusion. 2. Trace right pleural effusion. 3. Borderline cardiomegaly. Electronically Signed   By: Trudie Reed M.D.   On: 07/10/2021  05:49    Impression: Anemia-hemoglobin 8.3 on 06/30/2021, 10 on 05/10/2021, dropped to 9.1, subsequently to 8.5 and then 7.1 FOBT positive Significantly elevated BUN creatinine ratio, 114/1.28(BUN was 52 on 06/30/2021)  Thrombocytopenia, platelet 83 today Elevated PT/INR 24.5/2.2, elevated APTT 45-patient is not on anticoagulation Elevated lactic acid 2.6, low bicarb 20 Hypothyroidism, TSH 9.202 on 06/24/2021 COVID positive on 06/28/2021 Enteropathogenic E. coli positive on 03/15/2021  CT abdomen pelvis without contrast from 06/25/2021: Atelectasis or pneumonia, increasing stranding at pancreatic head and uncinate process concerning for pancreatitis with slightly prominent hypogastric lymph nodes, diverticulosis, periportal adenopathy, cholelithiasis, proctitis, small volume ascites  Plan: Due to drop in hemoglobin, elevated BUN, nausea, epigastric bloating, recommend EGD for further evaluation.  Patient underwent CT of the chest, abdomen and pelvis, results pending we will follow-up studies.  Anasarca could be related to hypoalbuminemia related to malnutrition, which if EGD is negative, could be related to depression.  Patient to be kept n.p.o. postmidnight. The risks and the benefits of the procedure were discussed with the patient in details. She understands and verbalizes consent.   LOS: 0 days   Kerin Salen, MD  07/11/2021, 2:27 PM

## 2021-07-11 NOTE — Consult Note (Signed)
[facility] Gastroenterology Consult  Referring Provider: Dr. Ardeth Sportsman hospitalist Primary Care Physician:  [name], MD Primary Gastroenterologist: Dr. Cristina Gong  Reason for Consultation: FOBT positive stool, anemia, loss of appetite, malnutrition, anasarca  HPI: Sally Wise is a 75 y.o. female  Patient is bedbound since her ankle surgery in October and complains of nausea, reduced appetite, epigastric discomfort, bloating. She complains of overall feeling weak.  She has not noticed any blood in stool or black stools. Recently she has noted any change in her bowel movements.  Past medical history of morbid obesity, atrial fibrillation, hypothyroidism, chronic heart failure, dyslipidemia, depression and hypertension, admitted on 05/10/2021 with weakness and lethargy. Admitted with anasarca, hypoalbuminemia, poor oral intake, occasional nausea Resident at a skilled nursing facility.  Flexible sigmoidoscopy 03/29/2021 for persistent anal irritation, mucoid rectal discharge: Unremarkable except diverticulosis and small hemorrhoids, unremarkable biopsies from rectum  Colonoscopy 2015: Pandiverticulosis, small colonic polyps-tubular adenoma, repeat recommended in 5 years  Past Medical History:  Diagnosis Date   Hematuria    evaluation with Dr. Joelyn Oms   Hypothyroidism 06/2010   Kidney stone [id number]   Lichen sclerosus et atrophicus 2/13   biopsy proven   LVH (left ventricular hypertrophy)    Morbid obesity (HCC)    PAF (paroxysmal atrial fibrillation) ([place])    a. Remote hx, re-established care 01/2013 with Dr. Johnsie Cancel for recurrence. 2D echo 01/2013: mod LVH, EF 55-65%, no RWMA, grade 1 d/d. b. 06/2014: started on Xarelto.    Thrombocytopenia ([place])    Vitamin D deficiency 06/2010   Wears glasses     Past Surgical History:  Procedure Laterality Date   BREAST EXCISIONAL BIOPSY  2010   left--was a vascular lesion   CARPAL TUNNEL RELEASE Left 08/09/2013   Procedure: LEFT  CARPAL TUNNEL RELEASE;  Surgeon: Cammie Sickle., MD;  Location: [place];  Service: Orthopedics;  Laterality: Left;   CATARACT EXTRACTION     COLONOSCOPY  04/2003       DILATION AND CURETTAGE OF UTERUS     HYSTEROSCOPY WITH D & C  8/99   ORIF ANKLE FRACTURE Left 05/07/2021   Procedure: OPEN REDUCTION INTERNAL FIXATION (ORIF) ANKLE FRACTURE;  Surgeon: Wylene Simmer, MD;  Location: [facility];  Service: Orthopedics;  Laterality: Left;   REPLACEMENT TOTAL KNEE Bilateral 12/2006  07/2007    TONSILLECTOMY     TRIGGER FINGER RELEASE Left 08/09/2013   Procedure: LEFT A1/A2 PULLEY RELEASE;  Surgeon: Cammie Sickle., MD;  Location: [place];  Service: Orthopedics;  Laterality: Left;   TUBAL LIGATION  5/77    Prior to Admission medications   Medication Sig Start Date End Date Taking? Authorizing Provider  ascorbic acid (VITAMIN C) 500 MG tablet Take 1 tablet (500 mg total) by mouth 2 (two) times daily. 06/28/21  Yes Lavina Hamman, MD  bisacodyl 5 MG EC tablet Take 5 mg by mouth daily as needed for moderate constipation.   Yes [provider]  Cholecalciferol (VITAMIN D3) 5000 UNITS TABS Take 1 tablet by mouth daily.   Yes [provider]  escitalopram (LEXAPRO) 5 MG tablet Take 5 mg by mouth at bedtime. 07/02/21  Yes [provider]  ferrous sulfate 325 (65 FE) MG tablet Take 325 mg by mouth daily with breakfast.   Yes [provider]  fludrocortisone (FLORINEF) 0.1 MG tablet Take 100 mcg by mouth daily. 07/08/21  Yes [provider]  folic acid (FOLVITE) 1 MG tablet Take 1  tablet (1 mg total) by mouth daily. 03/17/21  Yes Mercy Riding, MD  HYDROcodone-acetaminophen (NORCO/VICODIN) 5-325 MG tablet Take 1 tablet by mouth every 6 (six) hours as needed for moderate pain or severe pain. 06/28/21  Yes Lavina Hamman, MD  lactobacillus acidophilus & bulgar (LACTINEX) chewable tablet Chew 2 tablets by mouth 3 (three) times  daily. 06/28/21  Yes Lavina Hamman, MD  levothyroxine (EUTHYROX) 125 MCG tablet Take 1 tablet (125 mcg total) by mouth daily before breakfast. 06/29/21  Yes Lavina Hamman, MD  nutrition supplement, JUVEN, (JUVEN) PACK Take 1 packet by mouth 2 (two) times daily between meals. Patient taking differently: Take 1 packet by mouth 2 (two) times daily. Midday and bedtime 06/28/21  Yes Lavina Hamman, MD  Nutritional Supplements (,FEEDING SUPPLEMENT, PROSOURCE PLUS) liquid Take 30 mLs by mouth 2 (two) times daily between meals. 06/28/21  Yes Lavina Hamman, MD  ondansetron (ZOFRAN) 4 MG tablet Take 4 mg by mouth every 8 (eight) hours as needed for nausea or vomiting.   Yes [provider]  zinc sulfate 220 (50 Zn) MG capsule Take 1 capsule (220 mg total) by mouth daily. 06/29/21  Yes Lavina Hamman, MD  feeding supplement (ENSURE ENLIVE / ENSURE PLUS) LIQD Take 237 mLs by mouth 2 (two) times daily between meals. Patient not taking: Reported on 07/10/2021 06/28/21   Lavina Hamman, MD  mirtazapine (REMERON) 15 MG tablet Take 1 tablet (15 mg total) by mouth at bedtime. Patient not taking: Reported on 07/10/2021 06/28/21   Lavina Hamman, MD  Nystatin (GERHARDT'S BUTT CREAM) CREA Apply 1 application topically 2 (two) times daily. Patient not taking: Reported on 07/10/2021 06/28/21   Lavina Hamman, MD    Current Facility-Administered Medications  Medication Dose Route Frequency Provider Last Rate Last Admin   (feeding supplement) PROSource Plus liquid 30 mL  30 mL Oral BID BM Marylyn Ishihara, Tyrone A, DO   30 mL at 07/11/21 1012   acetaminophen (TYLENOL) tablet 650 mg  650 mg Oral Q6H PRN Marylyn Ishihara, Tyrone A, DO       Or   acetaminophen (TYLENOL) suppository 650 mg  650 mg Rectal Q6H PRN Marylyn Ishihara, Tyrone A, DO       ascorbic acid (VITAMIN C) tablet 500 mg  500 mg Oral BID Marylyn Ishihara, Tyrone A, DO   500 mg at 07/11/21 1013   bisacodyl (DULCOLAX) EC tablet 5 mg  5 mg Oral Daily PRN Marylyn Ishihara, Tyrone A, DO        Chlorhexidine Gluconate Cloth 2 % PADS 6 each  6 each Topical Daily Debbe Odea, MD   6 each at 07/11/21 1214   cholecalciferol (VITAMIN D) tablet 5,000 Units  5,000 Units Oral Daily Marylyn Ishihara, Tyrone A, DO   5,000 Units at 07/11/21 1013   escitalopram (LEXAPRO) tablet 5 mg  5 mg Oral QHS Kyle, Tyrone A, DO   5 mg at [id number]   folic acid (FOLVITE) tablet 1 mg  1 mg Oral Daily Kyle, Tyrone A, DO   1 mg at 07/11/21 1013   HYDROcodone-acetaminophen (NORCO/VICODIN) 5-325 MG per tablet 1 tablet  1 tablet Oral Q6H PRN Marylyn Ishihara, Tyrone A, DO       lactobacillus acidophilus & bulgar (LACTINEX) chewable tablet 2 tablet  2 tablet Oral TID Cherylann Ratel A, DO   2 tablet at 07/10/21 2049   levothyroxine (SYNTHROID) tablet 125 mcg  125 mcg Oral Q0600 Kyle, Tyrone A, DO   125 mcg  at 07/11/21 0617   ondansetron (ZOFRAN) tablet 4 mg  4 mg Oral Q8H PRN Marylyn Ishihara, Tyrone A, DO       phytonadione (VITAMIN K) tablet 10 mg  10 mg Oral Daily Rizwan, Saima, MD       sodium chloride (PF) 0.9 % injection            zinc sulfate capsule 220 mg  220 mg Oral Daily Kyle, Tyrone A, DO   220 mg at 07/11/21 1014    Allergies as of 07/10/2021   (No Known Allergies)    Family History  Problem Relation Age of Onset   Hypertension Mother    Kidney disease Mother    Alzheimer's disease Father    Diabetes Brother    Hypertension Brother    Spina bifida Daughter     Social History   Socioeconomic History   Marital status: Married    Spouse name: Not on file   Number of children: Not on file   Years of education: Not on file   Highest education level: Not on file  Occupational History   Not on file  Tobacco Use   Smoking status: Never   Smokeless tobacco: Never  Vaping Use   Vaping Use: Never used  Substance and Sexual Activity   Alcohol use: No   Drug use: No   Sexual activity: Yes    Partners: Male    Birth control/protection: Surgical  Other Topics Concern   Not on file  Social History Narrative   Not on file    Social Determinants of Health   Financial Resource Strain: Not on file  Food Insecurity: Not on file  Transportation Needs: Not on file  Physical Activity: Not on file  Stress: Not on file  Social Connections: Not on file  Intimate Partner Violence: Not on file    Review of Systems: Positive for: GI: Described in detail in HPI.    Gen: Denies any fever, chills, rigors, night sweats, anorexia, fatigue, weakness, malaise, involuntary weight loss, and sleep disorder CV: Denies chest pain, angina, palpitations, syncope, orthopnea, PND, peripheral edema, and claudication. Resp: Denies dyspnea, cough, sputum, wheezing, coughing up blood. GU : Denies urinary burning, blood in urine, urinary frequency, urinary hesitancy, nocturnal urination, and urinary incontinence. MS: Left ankle pain, patient is bedbound Derm: Denies rash, itching, oral ulcerations, hives, unhealing ulcers.  Psych: Denies depression, anxiety, memory loss, suicidal ideation, hallucinations,  and confusion. Heme: Denies bruising, bleeding, and enlarged lymph nodes. Neuro:  Denies any headaches, dizziness, paresthesias. Endo:  hypo-thyroid,  denies any problems with DM, adrenal function.  Physical Exam: Vital signs in last 24 hours: Temp:  [96.4 F (35.8 C)-98.3 F (36.8 C)] 97.5 F (36.4 C) (12/29 0620) Pulse Rate:  [74-83] 81 (12/29 0620) Resp:  [18] 18 (12/29 0232) BP: (96-105)/(58-75) 104/58 (12/29 0620) SpO2:  [99 %-100 %] 100 % (12/29 0620) Weight:  [135.6 kg] 135.6 kg (12/29 0620) Last BM Date: 07/11/21  General:   Alert,  Well-developed, obese, pleasant and cooperative in NAD Head:  Normocephalic and atraumatic. Eyes:  Sclera clear, no icterus.   Prominent pallor Ears:  Normal auditory acuity. Nose:  No deformity, discharge,  or lesions. Mouth:  No deformity or lesions.  Oropharynx pink & moist. Lungs:  Clear throughout to auscultation.   No wheezes, crackles, or rhonchi. No acute distress. Heart:   Regular rate and rhythm; no murmurs, clicks, rubs,  or gallops. Extremities: Bipedal pitting edema extending all the way to thighs  Neurologic:  Alert and  oriented x4;  grossly normal neurologically. Skin: left ankle scab noted from recent ankle surgery. Psych: Depressed   abdomen:  Soft, nontender and nondistended. No masses, hepatosplenomegaly or hernias noted. Normal bowel sounds, without guarding, and without rebound.         Lab Results: Recent Labs    07/10/21 0350 07/10/21 1255 07/10/21 2112 07/11/21 0350  WBC 8.8 7.4  --  6.5  HGB 10.0* 9.1* 8.5* 7.1*  HCT 32.0* 28.5* 26.1* 22.3*  PLT PLATELET CLUMPS NOTED ON SMEAR, UNABLE TO ESTIMATE 108*  --  83*   BMET Recent Labs    07/10/21 0350 07/11/21 0350  NA 140 139  K 4.2 3.8  CL 109 111  CO2 21* 20*  GLUCOSE 88 75  BUN 110* 114*  CREATININE 1.17* 1.28*  CALCIUM 8.6* 7.9*   LFT Recent Labs    07/11/21 0350  PROT 4.6*  ALBUMIN 2.0*  AST 16  ALT 15  ALKPHOS 261*  BILITOT 1.1   PT/INR Recent Labs    07/10/21 0350 07/11/21 0515  LABPROT 23.7* 24.5*  INR 2.1* 2.2*    Studies/Results: DG Chest Port 1 View  Result Date: 07/10/2021 CLINICAL DATA:  75 year old female with possible sepsis. EXAM: PORTABLE CHEST 1 VIEW COMPARISON:  Chest x-ray 06/29/2021. FINDINGS: Opacity at the left base obscuring the left hemidiaphragm with blunting of the left costophrenic sulcus. Subtle blunting of the right costophrenic sulcus. No pneumothorax. No evidence of pulmonary edema. Heart size is borderline to mildly enlarged. The patient is rotated to the left on today's exam, resulting in distortion of the mediastinal contours and reduced diagnostic sensitivity and specificity for mediastinal pathology. IMPRESSION: 1. Atelectasis and/or consolidation in the left lower lobe with small left pleural effusion. 2. Trace right pleural effusion. 3. Borderline cardiomegaly. Electronically Signed   By: Trudie Reed M.D.   On: 07/10/2021  05:49    Impression: Anemia-hemoglobin 8.3 on 06/30/2021, 10 on 05/10/2021, dropped to 9.1, subsequently to 8.5 and then 7.1 FOBT positive Significantly elevated BUN creatinine ratio, 114/1.28(BUN was 52 on 06/30/2021)  Thrombocytopenia, platelet 83 today Elevated PT/INR 24.5/2.2, elevated APTT 45-patient is not on anticoagulation Elevated lactic acid 2.6, low bicarb 20 Hypothyroidism, TSH 9.202 on 06/24/2021 COVID positive on 06/28/2021 Enteropathogenic E. coli positive on 03/15/2021  CT abdomen pelvis without contrast from 06/25/2021: Atelectasis or pneumonia, increasing stranding at pancreatic head and uncinate process concerning for pancreatitis with slightly prominent hypogastric lymph nodes, diverticulosis, periportal adenopathy, cholelithiasis, proctitis, small volume ascites  Plan: Due to drop in hemoglobin, elevated BUN, nausea, epigastric bloating, recommend EGD for further evaluation.  Patient underwent CT of the chest, abdomen and pelvis, results pending we will follow-up studies.  Anasarca could be related to hypoalbuminemia related to malnutrition, which if EGD is negative, could be related to depression.  Patient to be kept n.p.o. postmidnight. The risks and the benefits of the procedure were discussed with the patient in details. She understands and verbalizes consent.   LOS: 0 days   Kerin Salen, MD  07/11/2021, 2:27 PM

## 2021-07-12 ENCOUNTER — Inpatient Hospital Stay (HOSPITAL_COMMUNITY): Payer: Medicare Other | Admitting: Certified Registered Nurse Anesthetist

## 2021-07-12 ENCOUNTER — Other Ambulatory Visit (HOSPITAL_COMMUNITY): Payer: Medicare Other

## 2021-07-12 ENCOUNTER — Encounter (HOSPITAL_COMMUNITY): Payer: Self-pay | Admitting: Internal Medicine

## 2021-07-12 ENCOUNTER — Inpatient Hospital Stay (HOSPITAL_COMMUNITY): Payer: Medicare Other

## 2021-07-12 ENCOUNTER — Encounter (HOSPITAL_COMMUNITY)
Admission: EM | Disposition: A | Discharge status: Hospice | Payer: Self-pay | Source: Skilled Nursing Facility | Attending: Family Medicine

## 2021-07-12 DIAGNOSIS — R600 Localized edema: Secondary | ICD-10-CM

## 2021-07-12 DIAGNOSIS — E8809 Other disorders of plasma-protein metabolism, not elsewhere classified: Secondary | ICD-10-CM | POA: Diagnosis not present

## 2021-07-12 DIAGNOSIS — N183 Chronic kidney disease, stage 3 unspecified: Secondary | ICD-10-CM | POA: Diagnosis not present

## 2021-07-12 DIAGNOSIS — R601 Generalized edema: Secondary | ICD-10-CM | POA: Diagnosis not present

## 2021-07-12 HISTORY — PX: ESOPHAGOGASTRODUODENOSCOPY (EGD) WITH PROPOFOL: SHX5813

## 2021-07-12 LAB — RETICULOCYTES
Immature Retic Fract: 24.2 % — ABNORMAL HIGH (ref 2.3–15.9)
RBC.: 2.66 MIL/uL — ABNORMAL LOW (ref 3.87–5.11)
Retic Count, Absolute: 89.4 10*3/uL (ref 19.0–186.0)
Retic Ct Pct: 3.4 % — ABNORMAL HIGH (ref 0.4–3.1)

## 2021-07-12 LAB — ECHOCARDIOGRAM COMPLETE
AR max vel: 2.79 cm2
AV Peak grad: 7.2 mmHg
Ao pk vel: 1.34 m/s
Area-P 1/2: 5.84 cm2
Calc EF: 61 %
Height: 68 in
S' Lateral: 2.8 cm
Single Plane A2C EF: 58.5 %
Single Plane A4C EF: 62.1 %
Weight: 4784.02 oz

## 2021-07-12 LAB — BASIC METABOLIC PANEL
Anion gap: 7 (ref 5–15)
BUN: 97 mg/dL — ABNORMAL HIGH (ref 8–23)
CO2: 20 mmol/L — ABNORMAL LOW (ref 22–32)
Calcium: 7.9 mg/dL — ABNORMAL LOW (ref 8.9–10.3)
Chloride: 112 mmol/L — ABNORMAL HIGH (ref 98–111)
Creatinine, Ser: 1.36 mg/dL — ABNORMAL HIGH (ref 0.44–1.00)
GFR, Estimated: 40 mL/min — ABNORMAL LOW (ref >60)
Glucose, Bld: 85 mg/dL (ref 70–99)
Potassium: 3.8 mmol/L (ref 3.5–5.1)
Sodium: 139 mmol/L (ref 135–145)

## 2021-07-12 LAB — CBC
HCT: 23.6 % — ABNORMAL LOW (ref 36.0–46.0)
Hemoglobin: 7.6 g/dL — ABNORMAL LOW (ref 12.0–15.0)
MCH: 30.5 pg (ref 26.0–34.0)
MCHC: 32.2 g/dL (ref 30.0–36.0)
MCV: 94.8 fL (ref 80.0–100.0)
Platelets: 95 10*3/uL — ABNORMAL LOW (ref 150–400)
RBC: 2.49 MIL/uL — ABNORMAL LOW (ref 3.87–5.11)
RDW: 22.8 % — ABNORMAL HIGH (ref 11.5–15.5)
WBC: 5.6 10*3/uL (ref 4.0–10.5)
nRBC: 0.4 % — ABNORMAL HIGH (ref 0.0–0.2)

## 2021-07-12 LAB — PROTEIN / CREATININE RATIO, URINE
Creatinine, Urine: 35.48 mg/dL
Protein Creatinine Ratio: 0.17 mg/mg{Cre} — ABNORMAL HIGH (ref 0.00–0.15)
Total Protein, Urine: 6 mg/dL

## 2021-07-12 LAB — FOLATE: Folate: 48.7 ng/mL (ref >5.9)

## 2021-07-12 LAB — PROTIME-INR
INR: 2.2 — ABNORMAL HIGH (ref 0.8–1.2)
Prothrombin Time: 24.5 seconds — ABNORMAL HIGH (ref 11.4–15.2)

## 2021-07-12 LAB — FERRITIN: Ferritin: 594 ng/mL — ABNORMAL HIGH (ref 11–307)

## 2021-07-12 SURGERY — ESOPHAGOGASTRODUODENOSCOPY (EGD) WITH PROPOFOL
Anesthesia: Monitor Anesthesia Care

## 2021-07-12 MED ORDER — LACTATED RINGERS IV SOLN
INTRAVENOUS | Status: AC | PRN
  Administered 2021-07-12: 1000 mL via INTRAVENOUS

## 2021-07-12 MED ORDER — ADULT MULTIVITAMIN W/MINERALS CH
1.0000 | ORAL_TABLET | Freq: Every day | ORAL | Status: DC
  Administered 2021-07-12 – 2021-07-27 (×15): 1 via ORAL
  Filled 2021-07-12 (×17): qty 1

## 2021-07-12 MED ORDER — PROPOFOL 500 MG/50ML IV EMUL
INTRAVENOUS | Status: DC | PRN
  Administered 2021-07-12: 60 mg via INTRAVENOUS

## 2021-07-12 MED ORDER — PANTOPRAZOLE SODIUM 40 MG PO TBEC
40.0000 mg | DELAYED_RELEASE_TABLET | Freq: Two times a day (BID) | ORAL | Status: DC
  Administered 2021-07-12 – 2021-07-29 (×31): 40 mg via ORAL
  Filled 2021-07-12 (×32): qty 1

## 2021-07-12 MED ORDER — SUCRALFATE 1 GM/10ML PO SUSP
1.0000 g | Freq: Three times a day (TID) | ORAL | Status: DC
  Administered 2021-07-12 – 2021-07-29 (×46): 1 g via ORAL
  Filled 2021-07-12 (×54): qty 10

## 2021-07-12 MED ORDER — SODIUM CHLORIDE 0.9 % IV SOLN: INTRAVENOUS | Status: DC

## 2021-07-12 MED ORDER — ENSURE ENLIVE PO LIQD
237.0000 mL | Freq: Two times a day (BID) | ORAL | Status: DC
  Administered 2021-07-13 – 2021-07-27 (×12): 237 mL via ORAL

## 2021-07-12 SURGICAL SUPPLY — 15 items

## 2021-07-12 NOTE — Transfer of Care (Signed)
Immediate Anesthesia Transfer of Care Note  Patient: Sally Wise  Procedure(s) Performed: Procedure(s): ESOPHAGOGASTRODUODENOSCOPY (EGD) WITH PROPOFOL (N/A)  Patient Location: PACU and Endoscopy Unit  Anesthesia Type:MAC  Level of Consciousness: awake, alert  and oriented  Airway & Oxygen Therapy: Patient Spontanous Breathing and Patient connected to nasal cannula oxygen  Post-op Assessment: Report given to RN and Post -op Vital signs reviewed and stable  Post vital signs: Reviewed and stable  Last Vitals:  Vitals:   07/12/21 0400 07/12/21 1018  BP: (!) 105/51 101/61  Pulse: 72 78  Resp: 17 18  Temp: 36.8 C (!) 36.3 C  SpO2: 149% 70%    Complications: No apparent anesthesia complications

## 2021-07-12 NOTE — Anesthesia Postprocedure Evaluation (Signed)
Anesthesia Post Note  Patient: Sally Wise  Procedure(s) Performed: ESOPHAGOGASTRODUODENOSCOPY (EGD) WITH PROPOFOL     Patient location during evaluation: Endoscopy Anesthesia Type: MAC Level of consciousness: awake and alert, patient cooperative and oriented Pain management: pain level controlled Vital Signs Assessment: post-procedure vital signs reviewed and stable Respiratory status: spontaneous breathing, nonlabored ventilation and respiratory function stable Cardiovascular status: blood pressure returned to baseline and stable Postop Assessment: no apparent nausea or vomiting Anesthetic complications: no   No notable events documented.  Last Vitals:  Vitals:   07/12/21 1240 07/12/21 1249  BP: (!) 100/46   Pulse: 83 81  Resp: 17 17  Temp:  36.5 C  SpO2: 97% 98%    Last Pain:  Vitals:   07/12/21 1018  TempSrc: Temporal  PainSc: Asleep                 Alegandro Macnaughton,E. Yarisa Lynam

## 2021-07-12 NOTE — Anesthesia Preprocedure Evaluation (Addendum)
Anesthesia Evaluation  Patient identified by MRN, date of birth, ID band Patient awake    Reviewed: Allergy & Precautions, NPO status , Patient's Chart, lab work & pertinent test results  History of Anesthesia Complications Negative for: history of anesthetic complications  Airway Mallampati: II  TM Distance: >3 FB Neck ROM: Full    Dental  (+) Dental Advisory Given   Pulmonary neg pulmonary ROS,    breath sounds clear to auscultation       Cardiovascular + dysrhythmias Atrial Fibrillation  Rhythm:Regular Rate:Normal  '17 Stress: EF: 72%. There is a large defect of moderate severity present in the basal inferoseptal, basal inferior, basal inferolateral, mid inferoseptal, mid inferior, mid inferolateral and apical inferior location. This is most consistent with diaphragmatic attenuation. This is a low risk study. There is no evidence of ischemia or infarction '21 ECHO:  EF 55-60%. The LV has normal function, no regional wall motion abnormalities. Grade I DD, no significant valvular abnormalities    Neuro/Psych Depression    GI/Hepatic Neg liver ROS, Presumed GI bleed   Endo/Other  Hypothyroidism Morbid obesity  Renal/GU Renal InsufficiencyRenal disease     Musculoskeletal   Abdominal (+) + obese,   Peds  Hematology  (+) Blood dyscrasia (Hb 7.6, plt 95k), anemia , INR 2.2   Anesthesia Other Findings   Reproductive/Obstetrics                            Anesthesia Physical Anesthesia Plan  ASA: 4  Anesthesia Plan: MAC   Post-op Pain Management: Minimal or no pain anticipated   Induction:   PONV Risk Score and Plan: 2 and Treatment may vary due to age or medical condition  Airway Management Planned: Natural Airway and Nasal Cannula  Additional Equipment: None  Intra-op Plan:   Post-operative Plan:   Informed Consent: I have reviewed the patients History and Physical, chart, labs  and discussed the procedure including the risks, benefits and alternatives for the proposed anesthesia with the patient or authorized representative who has indicated his/her understanding and acceptance.     Dental advisory given and Consent reviewed with POA  Plan Discussed with: CRNA and Surgeon  Anesthesia Plan Comments: (Discussed with patient and then patient's son by telephone)       Anesthesia Quick Evaluation

## 2021-07-12 NOTE — NC FL2 (Signed)
[place] LEVEL OF CARE SCREENING TOOL     IDENTIFICATION  Patient Name: Sally Wise Birthdate: 09/11/1945 Sex: female Admission Date (Current Location): 07/10/2021  [facility] and [place] Number:  Herbalist and Address:  [facility],  [place] [address] [place]      Provider Number:    Attending Physician Name and Address:  Debbe Odea, MD  Relative Name and Phone Number:  James(spouse) [phone]    Current Level of Care: Hospital Recommended Level of Care: [place] Prior Approval Number:    Date Approved/Denied:   PASRR Number: [id number]  Discharge Plan: SNF    Current Diagnoses: Patient Active Problem List   Diagnosis Date Noted   Anasarca 07/11/2021   History of COVID-19 06/29/2021   Foley catheter in place 06/29/2021   Failure to thrive in adult 06/27/2021   Hypoalbuminemia 06/27/2021   Depression 06/27/2021   CAP (community acquired pneumonia) 06/27/2021   Pleural effusion 06/27/2021   Daytime somnolence 06/27/2021   Pressure injury of skin, Coccyx Mid;Lower Stage 3 06/25/2021   Acute renal failure superimposed on stage 3a chronic kidney disease ([place]) 06/24/2021   Hypotension 06/24/2021   Ankle fracture 04/28/2021   Chronic diastolic CHF (congestive heart failure) ([place]) 04/28/2021   Anemia 04/28/2021   Prolonged QT interval 04/28/2021   Mucoid diarrhea 03/16/2021   Diarrhea 03/14/2021   Acute on chronic diastolic HF (heart failure) ([place]) 08/24/2020   Obesity, Class III, BMI 40-49.9 (morbid obesity) ([place]) 08/24/2020   Chronic kidney disease with active medical management without dialysis, stage 3 (moderate) ([place]) 08/24/2020   Combined rheumatic disorders of mitral, 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   Hardening of the aorta (main artery of the heart) ([place]) 08/24/2020   Kidney stone 08/24/2020   Pure hypercholesterolemia  08/24/2020   Scoliosis of thoracic spine 08/24/2020   Solitary pulmonary nodule 08/24/2020   Other long term (current) drug therapy 08/24/2020   Abnormal findings on diagnostic imaging of other specified body structures 08/24/2020   Acute respiratory failure with hypoxia ([place]) 06/04/2020   Multifocal pneumonia 06/03/2020   Low back pain 12/12/2019   History of total knee replacement, right 0000000   Lichen sclerosus et atrophicus 01/04/2015   LVH (left ventricular hypertrophy)    PAF (paroxysmal atrial fibrillation) ([place]) 07/06/2014   Thrombocytopenia ([place]) 07/06/2014   Hyperglycemia 07/06/2014   Morbid obesity ([place]) 01/25/2013   Hypothyroidism 01/15/2013   History of total knee replacement, left 08/09/2007    Orientation RESPIRATION BLADDER Height & Weight     Self, Time, Situation, Place  Normal Continent Weight: 135.6 kg Height:  5\' 8"  (172.7 cm)  BEHAVIORAL SYMPTOMS/MOOD NEUROLOGICAL BOWEL NUTRITION STATUS      Continent Diet (Regular)  AMBULATORY STATUS COMMUNICATION OF NEEDS Skin   Total Care (Total asst-w/c-hoyer lift) Verbally                         Personal Care Assistance Level of Assistance  Bathing, Feeding, Dressing Bathing Assistance: Limited assistance Feeding assistance: Limited assistance Dressing Assistance: Limited assistance     Functional Limitations Info  Sight, Hearing, Speech Sight Info: Impaired (eyeglasses) Hearing Info: Adequate Speech Info: Adequate    SPECIAL CARE FACTORS FREQUENCY   (Nsg care-ankle/sacral wounds PTA-wound care)                    Contractures Contractures Info: Not  present    Additional Factors Info  Code Status, Allergies Code Status Info:  (Full) Allergies Info:  (NKA)           Current Medications (07/12/2021):  This is the current hospital active medication list Current Facility-Administered Medications  Medication Dose Route Frequency Provider Last Rate Last Admin   (feeding supplement)  PROSource Plus liquid 30 mL  30 mL Oral BID BM Kyle, Tyrone A, DO   30 mL at 07/11/21 1509   0.9 %  sodium chloride infusion   Intravenous Continuous Kerin Salen, MD 20 mL/hr at 07/12/21 0536 New Bag at 07/12/21 0536   acetaminophen (TYLENOL) tablet 650 mg  650 mg Oral Q6H PRN Margie Ege A, DO       Or   acetaminophen (TYLENOL) suppository 650 mg  650 mg Rectal Q6H PRN Ronaldo Miyamoto, Tyrone A, DO       ascorbic acid (VITAMIN C) tablet 500 mg  500 mg Oral BID Ronaldo Miyamoto, Tyrone A, DO   500 mg at 07/12/21 7622   bisacodyl (DULCOLAX) EC tablet 5 mg  5 mg Oral Daily PRN Ronaldo Miyamoto, Tyrone A, DO       Chlorhexidine Gluconate Cloth 2 % PADS 6 each  6 each Topical Daily Calvert Cantor, MD   6 each at 07/11/21 1214   cholecalciferol (VITAMIN D) tablet 5,000 Units  5,000 Units Oral Daily Ronaldo Miyamoto, Tyrone A, DO   5,000 Units at 07/12/21 6333   escitalopram (LEXAPRO) tablet 5 mg  5 mg Oral QHS Kyle, Tyrone A, DO   5 mg at 07/11/21 2212   folic acid (FOLVITE) tablet 1 mg  1 mg Oral Daily Ronaldo Miyamoto, Tyrone A, DO   1 mg at 07/12/21 5456   HYDROcodone-acetaminophen (NORCO/VICODIN) 5-325 MG per tablet 1 tablet  1 tablet Oral Q6H PRN Ronaldo Miyamoto, Tyrone A, DO       lactobacillus acidophilus & bulgar (LACTINEX) chewable tablet 2 tablet  2 tablet Oral TID Margie Ege A, DO   2 tablet at 07/11/21 1511   levothyroxine (SYNTHROID) tablet 125 mcg  125 mcg Oral Q0600 Margie Ege A, DO   125 mcg at 07/12/21 0529   ondansetron (ZOFRAN) tablet 4 mg  4 mg Oral Q8H PRN Ronaldo Miyamoto, Tyrone A, DO       phytonadione (VITAMIN K) tablet 10 mg  10 mg Oral Daily Calvert Cantor, MD   10 mg at 07/12/21 2563   zinc sulfate capsule 220 mg  220 mg Oral Daily Kyle, Tyrone A, DO   220 mg at 07/12/21 8937     Discharge Medications: Please see discharge summary for a list of discharge medications.  Relevant Imaging Results:  Relevant Lab Results:   Additional Information [address]  Elzie Knisley, [place], [place]

## 2021-07-12 NOTE — Progress Notes (Signed)
Initial Nutrition Assessment  DOCUMENTATION CODES:   Morbid obesity  INTERVENTION:   -Ensure Enlive po BID, each supplement provides 350 kcal and 20 grams of protein  -Prosource Plus PO BID, each provides 100 kcals and 15g protein  -Multivitamin with minerals daily  -1 packet Juven BID, each packet provides 95 calories, 2.5 grams of protein (collagen), and 9.8 grams of carbohydrate (3 grams sugar); also contains 7 grams of L-arginine and L-glutamine, 300 mg vitamin C, 15 mg vitamin E, 1.2 mcg vitamin B-12, 9.5 mg zinc, 200 mg calcium, and 1.5 g  Calcium Beta-hydroxy-Beta-methylbutyrate to support wound healing   -Continue 500 mg Vitamin C BID -Continue zinc sulfate supplementation x 10 days  NUTRITION DIAGNOSIS:   Increased nutrient needs related to acute illness, wound healing as evidenced by estimated needs.  GOAL:   Patient will meet greater than or equal to 90% of their needs  MONITOR:   Diet advancement, PO intake, Supplement acceptance, Labs, Weight trends, Skin, I & O's  REASON FOR ASSESSMENT:   Consult Assessment of nutrition requirement/status  ASSESSMENT:   75 y.o. female with medical history significant of pA fib, hypothyroidism, chronic thrombocytopenia, chronic HFpEF, HLD, HTN, depression. Presenting with weakness and lethargy.  She fractured her ankle in October 2022 and since then has been living at a SNF.  She has barely been eating since then and is still unable to walk.  She was admitted on 12/12 for dehydration, hypotension and AKI. Admited for the same.  Patient in endoscopy for EGD. NPO. Per chart review, pt has not been eating well at her facility, has been having nausea as well. Per WOC note, some of pt's wounds have been healing. Pt with severe anasarca with weeping.  Resume supplements above once diet is advanced.   Per weight records, pt weighed 317 lbs on 12/19, weighed 296 lbs on 10/25. Pt's weight most likely shifting d/t fluid.  Per nursing  documentation, pt with severe BUE and BLE edema, as well as generalized edema.   Medications: Vitamin C, Vitamin D, Folic acid, Lactinex, Vitamin K, Zinc sulfate, Lactated ringers  Labs reviewed.  NUTRITION - FOCUSED PHYSICAL EXAM:  In endoscopy. No depletions were found in exam on 12/15.  Diet Order:   Diet Order             Diet NPO time specified  Diet effective midnight                   EDUCATION NEEDS:   No education needs have been identified at this time  Skin:  Skin Assessment: Skin Integrity Issues: Skin Integrity Issues:: Stage III, Unstageable, Stage I Stage I: medial groin Stage III: mid coccyx -healing per WOC note Unstageable: distal L pretibial  Last BM:  12/29 -type 6  Height:   Ht Readings from Last 1 Encounters:  07/12/21 5\' 8"  (1.727 m)    Weight:   Wt Readings from Last 1 Encounters:  07/12/21 135.6 kg    BMI:  Body mass index is 45.46 kg/m.  Estimated Nutritional Needs:   Kcal:  2200-2400  Protein:  120-130g  Fluid:  2.2L/day   07/14/21, MS, RD, LDN Inpatient Clinical Dietitian Contact information available via Amion

## 2021-07-12 NOTE — Progress Notes (Signed)
Echocardiogram attempted at 11:45, patient off the floor to endo. Will re-attempt as schedule permits.  [facility] Kelcy Baeten RDCS

## 2021-07-12 NOTE — TOC Initial Note (Addendum)
Transition of Care [facility]) - Initial/Assessment Note    Patient Details  Name: Sally Wise MRN: [id number] Date of Birth: Jun 24, 1946  Transition of Care [facility]) CM/SW Contact:    Lanier Clam, RN Phone Number: 07/12/2021, 8:57 AM  Clinical Narrative:  spoke to [employer], & spouse James-agree to return back to [facility]-total asst w/wheelchair,& hoyer lift,has ankle, & sacral wounds PTA(this is the skill for getting auth-medical). No PT cons recc.                Expected Discharge Plan: Skilled Nursing Facility Barriers to Discharge: Continued Medical Work up   Patient Goals and CMS Choice Patient states their goals for this hospitalization and ongoing recovery are:: return back to [facility] SNF CMS Medicare.gov Compare Post Acute Care list provided to:: Patient Represenative (must comment) Choice offered to / list presented to : Spouse  Expected Discharge Plan and Services Expected Discharge Plan: Skilled Nursing Facility   Discharge Planning Services: CM Consult Post Acute Care Choice: Skilled Nursing Facility Living arrangements for the past 2 months: Skilled Nursing Facility                                      Prior Living Arrangements/Services Living arrangements for the past 2 months: Skilled Nursing Facility Lives with:: Facility Resident Patient language and need for interpreter reviewed:: Yes Do you feel safe going back to the place where you live?: Yes      Need for Family Participation in Patient Care: No (Comment) Care giver support system in place?: Yes (comment) Current home services: DME (w/c,hoyer lift) Criminal Activity/Legal Involvement Pertinent to Current Situation/Hospitalization: No - Comment as needed  Activities of Daily Living Home Assistive Devices/Equipment: Wheelchair ADL Screening (condition at time of admission) Patient's cognitive ability adequate to safely complete daily activities?: Yes Is the patient deaf or  have difficulty hearing?: No Does the patient have difficulty seeing, even when wearing glasses/contacts?: No Does the patient have difficulty concentrating, remembering, or making decisions?: No Patient able to express need for assistance with ADLs?: Yes Does the patient have difficulty dressing or bathing?: Yes Independently performs ADLs?: No Communication: Independent Dressing (OT): Needs assistance Is this a change from baseline?: Pre-admission baseline Grooming: Needs assistance, Dependent Is this a change from baseline?: Pre-admission baseline Feeding: Independent Bathing: Needs assistance, Dependent Is this a change from baseline?: Pre-admission baseline Toileting: Needs assistance, Dependent Is this a change from baseline?: Pre-admission baseline In/Out Bed: Needs assistance, Dependent Is this a change from baseline?: Pre-admission baseline Walks in Home: Needs assistance, Dependent Is this a change from baseline?: Pre-admission baseline Does the patient have difficulty walking or climbing stairs?: Yes Weakness of Legs: Both Weakness of Arms/Hands: Both  Permission Sought/Granted Permission sought to share information with : Case Manager Permission granted to share information with : Yes, Verbal Permission Granted  Share Information with NAME: Case manager     Permission granted to share info w Relationship: james(spouse) [phone]     Emotional Assessment Appearance:: Appears stated age Attitude/Demeanor/Rapport: Gracious Affect (typically observed): Accepting Orientation: : Oriented to Self, Oriented to Place, Oriented to  Time, Oriented to Situation Alcohol / Substance Use: Not Applicable Psych Involvement: No (comment)  Admission diagnosis:  Dehydration [E86.0] Weakness [R53.1] Hypotension, unspecified hypotension type [I95.9] Anasarca [R60.1] Patient Active Problem List   Diagnosis Date Noted   Anasarca 07/11/2021   History of COVID-19 06/29/2021  Foley catheter in place 06/29/2021   Failure to thrive in adult 06/27/2021   Hypoalbuminemia 06/27/2021   Depression 06/27/2021   CAP (community acquired pneumonia) 06/27/2021   Pleural effusion 06/27/2021   Daytime somnolence 06/27/2021   Pressure injury of skin, Coccyx Mid;Lower Stage 3 06/25/2021   Acute renal failure superimposed on stage 3a chronic kidney disease ([place]) 06/24/2021   Hypotension 06/24/2021   Ankle fracture 04/28/2021   Chronic diastolic CHF (congestive heart failure) ([place]) 04/28/2021   Anemia 04/28/2021   Prolonged QT interval 04/28/2021   Mucoid diarrhea 03/16/2021   Diarrhea 03/14/2021   Acute on chronic diastolic HF (heart failure) ([place]) 08/24/2020   Obesity, Class III, BMI 40-49.9 (morbid obesity) ([place]) 08/24/2020   Chronic kidney disease with active medical management without dialysis, stage 3 (moderate) ([place]) 08/24/2020   Combined rheumatic disorders of mitral, 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   Congestive rheumatic heart failure ([place]) 08/24/2020   Hardening of the aorta (main artery of the heart) ([place]) 08/24/2020   Kidney stone 08/24/2020   Pure hypercholesterolemia 08/24/2020   Scoliosis of thoracic spine 08/24/2020   Solitary pulmonary nodule 08/24/2020   Other long term (current) drug therapy 08/24/2020   Abnormal findings on diagnostic imaging of other specified body structures 08/24/2020   Acute respiratory failure with hypoxia ([place]) 06/04/2020   Multifocal pneumonia 06/03/2020   Low back pain 12/12/2019   History of total knee replacement, right 0000000   Lichen sclerosus et atrophicus 01/04/2015   LVH (left ventricular hypertrophy)    PAF (paroxysmal atrial fibrillation) ([place]) 07/06/2014   Thrombocytopenia ([place]) 07/06/2014   Hyperglycemia 07/06/2014   Morbid obesity ([place]) 01/25/2013   Hypothyroidism 01/15/2013   History of total knee replacement, left 08/09/2007   PCP:  [name], MD Pharmacy:   [place], [place] - [address] [place] [address] Phone: [phone] Fax: [phone]     Social Determinants of Health (SDOH) Interventions    Readmission Risk Interventions Readmission Risk Prevention Plan 06/08/2020  Post Dischage Appt Complete  Medication Screening Complete  Transportation Screening Complete  Some recent data might be hidden

## 2021-07-12 NOTE — Interval H&P Note (Signed)
History and Physical Interval Note: 75/female with positive occult blood in stool, anemia, elevated INR, loss of appetite and anasarca for EGD.  07/12/2021 11:42 AM  Sally Wise  has presented today for EGD, with the diagnosis of anemia, fobt positive, elevated BUN.  The various methods of treatment have been discussed with the patient and family. After consideration of risks, benefits and other options for treatment, the patient has consented to  Procedure(s): ESOPHAGOGASTRODUODENOSCOPY (EGD) WITH PROPOFOL (N/A) as a surgical intervention.  The patient's history has been reviewed, patient examined, no change in status, stable for surgery.  I have reviewed the patient's chart and labs.  Questions were answered to the patient's satisfaction.     Kerin Salen

## 2021-07-12 NOTE — Op Note (Signed)
[facility] Patient Name: Sally Wise Procedure Date: 07/12/2021 MRN: [id number] Attending MD: Kerin Salen , MD Date of Birth: 20-Jan-1946 CSN: [id number] Age: 75 Admit Type: Inpatient Procedure:                Upper GI endoscopy Indications:              Unexplained iron deficiency anemia, Heme positive                            stool, nausea, loss of appetite, anasarca Providers:                Kerin Salen, MD, Janae Sauce. Steele Berg, RN, [employer],                            Technician, Sherron Flemings Referring MD:             [facility] Medicines:                Monitored Anesthesia Care Complications:            No immediate complications. Estimated blood loss:                            Minimal. Estimated Blood Loss:     Estimated blood loss was minimal. Procedure:                Pre-Anesthesia Assessment:                           - Prior to the procedure, a History and Physical                            was performed, and patient medications and                            allergies were reviewed. The patient's tolerance of                            previous anesthesia was also reviewed. The risks                            and benefits of the procedure and the sedation                            options and risks were discussed with the patient.                            All questions were answered, and informed consent                            was obtained. Prior Anticoagulants: The patient has                            taken no previous anticoagulant or antiplatelet  agents. ASA Grade Assessment: IV - A patient with                            severe systemic disease that is a constant threat                            to life. After reviewing the risks and benefits,                            the patient was deemed in satisfactory condition to                            undergo the procedure.                            After obtaining informed consent, the endoscope was                            passed under direct vision. Throughout the                            procedure, the patient's blood pressure, pulse, and                            oxygen saturations were monitored continuously. The                            [id number] Olympus endoscope was introduced                            through the mouth, and advanced to the second part                            of duodenum. The upper GI endoscopy was                            accomplished without difficulty. The patient                            tolerated the procedure well. Scope In: Scope Out: Findings:      The examined esophagus was normal.      Diffuse moderately erythematous mucosa without bleeding was found in the       gastric body. Mucosa was friable with easy oozing on passage of scope       and the entire gastric cavity appeared erythematous with petechiae like       areas scattered in the gastric body.      The cardia and gastric fundus were normal on retroflexion.      Diffuse moderately erythematous mucosa without active bleeding and with       no stigmata of bleeding was found in the duodenal bulb, in the first       portion of the duodenum and in the second portion of the duodenum.      One non-bleeding superficial duodenal ulcer with a clean ulcer base       (  Forrest Class III) was found in the first portion of the duodenum. The       lesion was 4 mm in largest dimension.      Biopsies were not taken as patient's INR was elevaed at 2.2 Impression:               - Normal esophagus.                           - Erythematous mucosa in the gastric body.                           - Erythematous duodenopathy.                           - Non-bleeding duodenal ulcer with a clean ulcer                            base (Forrest Class III).                           - No specimens collected. Moderate Sedation:      Patient did not  receive moderate sedation for this procedure, but       instead received monitored anesthesia care. Recommendation:           - Resume regular diet.                           - Use Protonix (pantoprazole) 40 mg PO BID for 1                            month.                           - Use sucralfate suspension 1 gram PO QID for 2                            weeks.                           - Stool for H pylori Antigen and celiac serology. Procedure Code(s):        --- Professional ---                           [phone], Esophagogastroduodenoscopy, flexible,                            transoral; diagnostic, including collection of                            specimen(s) by brushing or washing, when performed                            (separate procedure) Diagnosis Code(s):        --- Professional ---                           [id number], Other  diseases of stomach and duodenum                           K26.9, Duodenal ulcer, unspecified as acute or                            chronic, without hemorrhage or perforation                           D50.9, Iron deficiency anemia, unspecified                           R19.5, Other fecal abnormalities CPT copyright 2019 [employer]. All rights reserved. The codes documented in this report are preliminary and upon coder review may  be revised to meet current compliance requirements. Ronnette Juniper, MD 07/12/2021 12:29:19 PM This report has been signed electronically. Number of Addenda: 0

## 2021-07-12 NOTE — Progress Notes (Addendum)
PROGRESS NOTE    Sally Wise   AJO:[id number]  DOB: [birth year]  DOA: 07/10/2021 PCP: Daisy Floro, MD   Brief Narrative:  Sally Wise is a 75 y.o. female with medical history significant of pA fib, hypothyroidism, chronic thrombocytopenia, chronic HFpEF, HLD, HTN, depression. Presenting with weakness and lethargy. She fractured her ankle in October 2022 and since then has been living at a SNF.  She has barely been eating since then and is still unable to walk.  She was admitted on 12/12 for dehydration, hypotension and AKI. She returns now for the same.  She is found to have severe anasarca they are unable to tell if she has actually lost any weight. She was given IV fluids and albumin in the ED and admitted for further work-up.  Subjective: Continues to have poor oral intake and feels weak.    Assessment & Plan:   Principal Problem:   Anasarca, hypo-albuminemia due to poor oral intake -Elevated BUN/creatinine ratio and lactic acid seems to suggest that she is intravascularly depleted while having severe anasarca -She has received IV fluids with albumin in the ED-hold further IV fluids and follow urine output carefully- foley placed as she is incontinent and too swollen to manage a purewick -if urine output is dropping, she will need more IV fluids - she has been receiving BID JUven at the SNF  -we will check a 2D echo to see if she has right heart failure-she is laying flat, is not hypoxic and had no pulm edema on the CXR and therefore I am not concerned about left heart failure  - check urine/protein creatinine ratio for nephrotic syndrome -  TSH 8.603 and Free T 4 0.88- likely sick euthyroid and does not need Synthroid dose adjusted - addendum: normal urine/pro creatinine ratio- no heart failure on ECHO  Active Problems: Poor oral intake, new diagnosis of PUD -poor intake ongoing for many months-no other significant GI symptoms other than occasional nausea -  Stool Hemoccult is positive -Underwent a CT abdomen pelvis without contrast earlier this month which revealed small pleural effusions, scattered hypodensities in the liver periportal adenopathy, proctitis versus congestive wall thickening or less likely infiltrative disease and overall appears to be consistent with fluid overload - I asked GI to evaluate for heme positive stool/possible colon cancer or PUD - have requested a nutrition consult and encourage the patient to eat even if she has no appetite - 12/30 EGD > - Erythematous mucosa in the gastric body. (Very friable and easily bleeding)                           - Erythematous duodenopathy.                           - Non-bleeding duodenal ulcer with a clean ulcer                            base (Forrest Class III). -I communicated with Dr Marca Ancona  recommendations- BID Protonix x 1 month and Sucralfate 1 gm QID x 2 wks, stool for H pylori antigen and celiac serology (ordered) - she is still not eating anything   Lactic acidosis - recheck tomorrow  Depression - on Lexapro- med rec shows she was on Remeron in the past- still very depressed - hjave asked psych to evaluate her  Elevated INR -  Patient is not on any anticoagulation and INR is 2.1-2.2 - ? If this is due to being malnourished - Start vitamin K supplementation daily and follow- if not improving, will need to ask for heme eval    Morbid obesity ([place])- 299 lbs Mild Fatty liver Body mass index is 45.46 kg/m. - she has been the same weight since the Oct admission despite statements that she is unable to eat  - ? If she has lost body fat and gained fluid weight  Chronic kidney disease stage IIIa - Creatinine in October was as low as 0.9 and 1.0 which is around where she is now, placing her in CKD stage IIIa  Normocytic anemia - Hemoglobin of 10 when admitted has dropped to 7.1 after IV fluids -Continue to follow -obtain anemia panel  Thrombocytopenia - Appears to be  chronic  Chronically debilitated - has not walked since her ankle fracture - At the SNF, she is lifted out of bed to a wheelchair and does not ambulate  Pressure Injury 06/25/21 Coccyx Mid;Lower Stage 3 -  Full thickness tissue loss. Subcutaneous fat may be visible but bone, tendon or muscle are NOT exposed. 1.5 cm by 1 cm (Active)  06/25/21 0130  Location: Coccyx  Location Orientation: Mid;Lower  Staging: Stage 3 -  Full thickness tissue loss. Subcutaneous fat may be visible but bone, tendon or muscle are NOT exposed.  Wound Description (Comments): 1.5 cm by 1 cm  Present on Admission: Yes     Pressure Injury Pretibial Distal;Left Unstageable - Full thickness tissue loss in which the base of the injury is covered by slough (yellow, tan, gray, green or brown) and/or eschar (tan, brown or black) in the wound bed. 2 cm x 1 cm (Active)     Location: Pretibial  Location Orientation: Distal;Left  Staging: Unstageable - Full thickness tissue loss in which the base of the injury is covered by slough (yellow, tan, gray, green or brown) and/or eschar (tan, brown or black) in the wound bed.  Wound Description (Comments): 2 cm x 1 cm  Present on Admission: Yes     Pressure Injury 07/10/21 Groin Medial Stage 1 -  Intact skin with non-blanchable redness of a localized area usually over a bony prominence. medical equipment PI: lines of nonblanchable redness where purewick tubing might have been (Active)  07/10/21 1906  Location: Groin  Location Orientation: Medial  Staging: Stage 1 -  Intact skin with non-blanchable redness of a localized area usually over a bony prominence.  Wound Description (Comments): medical equipment PI: lines of nonblanchable redness where purewick tubing might have been  Present on Admission: Yes       Time spent in minutes: 45 DVT prophylaxis: Place and maintain sequential compression device Start: 07/11/21 1402 Place TED hose Start: 07/10/21 1808  Code Status: full  code Family Communication: husband Level of Care: Level of care: Med-Surg Disposition Plan:  Status is: Inpatient  Remains inpatient appropriate because: work up cause of loss of appetite, consult for depression Consultants:  GI Procedures:  EGD Antimicrobials:  Anti-infectives (From admission, onward)    Start     Dose/Rate Route Frequency Ordered Stop   07/11/21 0800  cefTRIAXone (ROCEPHIN) 2 g in sodium chloride 0.9 % 100 mL IVPB  Status:  Discontinued        2 g 200 mL/hr over 30 Minutes Intravenous Every 24 hours 07/10/21 1807 07/11/21 0906   07/10/21 0700  cefTRIAXone (ROCEPHIN) 2 g in sodium chloride 0.9 %  100 mL IVPB        2 g 200 mL/hr over 30 Minutes Intravenous  Once 07/10/21 0654 07/10/21 0816        Objective: Vitals:   07/12/21 1229 07/12/21 1230 07/12/21 1240 07/12/21 1249  BP:  (!) 107/45 (!) 100/46   Pulse:  91 83 81  Resp: (!) 23 (!) 24 17 17   Temp:    97.7 F (36.5 C)  TempSrc:      SpO2: 99% 100% 97% 98%  Weight:      Height:        Intake/Output Summary (Last 24 hours) at 07/12/2021 1348 Last data filed at 07/12/2021 1224 Gross per 24 hour  Intake 627.72 ml  Output 2425 ml  Net -1797.28 ml    Filed Weights   07/10/21 0338 07/11/21 0620 07/12/21 1018  Weight: (!) 144.2 kg 135.6 kg 135.6 kg    Examination: General exam: Appears comfortable  HEENT: PERRLA, oral mucosa moist, no sclera icterus or thrush Respiratory system: Clear to auscultation. Respiratory effort normal. Cardiovascular system: S1 & S2 heard, regular rate and rhythm Gastrointestinal system: Abdomen soft, non-tender, nondistended. Normal bowel sounds   Central nervous system: Alert and oriented. No focal neurological deficits. Extremities: No cyanosis, clubbing - severe anasarca Skin: No rashes or ulcers Psychiatry:  flat affect    Data Reviewed: I have personally reviewed following labs and imaging studies  CBC: Recent Labs  Lab 07/10/21 0350 07/10/21 1255  07/10/21 2112 07/11/21 0350 07/12/21 0622  WBC 8.8 7.4  --  6.5 5.6  NEUTROABS 4.6 4.0  --   --   --   HGB 10.0* 9.1* 8.5* 7.1* 7.6*  HCT 32.0* 28.5* 26.1* 22.3* 23.6*  MCV 95.8 93.8  --  94.1 94.8  PLT PLATELET CLUMPS NOTED ON SMEAR, UNABLE TO ESTIMATE 108*  --  83* 95*    Basic Metabolic Panel: Recent Labs  Lab 07/10/21 0350 07/11/21 0350 07/12/21 0446  NA 140 139 139  K 4.2 3.8 3.8  CL 109 111 112*  CO2 21* 20* 20*  GLUCOSE 88 75 85  BUN 110* 114* 97*  CREATININE 1.17* 1.28* 1.36*  CALCIUM 8.6* 7.9* 7.9*    GFR: Estimated Creatinine Clearance: 50.6 mL/min (A) (by C-G formula based on SCr of 1.36 mg/dL (H)). Liver Function Tests: Recent Labs  Lab 07/10/21 0350 07/11/21 0350  AST 16 16  ALT 19 15  ALKPHOS 319* 261*  BILITOT 1.2 1.1  PROT 5.5* 4.6*  ALBUMIN 2.2* 2.0*    No results for input(s): LIPASE, AMYLASE in the last 168 hours. No results for input(s): AMMONIA in the last 168 hours. Coagulation Profile: Recent Labs  Lab 07/10/21 0350 07/11/21 0515 07/12/21 0446  INR 2.1* 2.2* 2.2*    Cardiac Enzymes: No results for input(s): CKTOTAL, CKMB, CKMBINDEX, TROPONINI in the last 168 hours. BNP (last 3 results) No results for input(s): PROBNP in the last 8760 hours. HbA1C: No results for input(s): HGBA1C in the last 72 hours. CBG: No results for input(s): GLUCAP in the last 168 hours. Lipid Profile: No results for input(s): CHOL, HDL, LDLCALC, TRIG, CHOLHDL, LDLDIRECT in the last 72 hours. Thyroid Function Tests: Recent Labs    07/11/21 1504  TSH 8.603*  FREET4 0.88   Anemia Panel: No results for input(s): VITAMINB12, FOLATE, FERRITIN, TIBC, IRON, RETICCTPCT in the last 72 hours. Urine analysis:    Component Value Date/Time   COLORURINE YELLOW 07/10/2021 0915   APPEARANCEUR CLEAR 07/10/2021 0915   LABSPEC  1.014 07/10/2021 0915   PHURINE 5.0 07/10/2021 0915   GLUCOSEU NEGATIVE 07/10/2021 0915   HGBUR NEGATIVE 07/10/2021 0915   BILIRUBINUR  NEGATIVE 07/10/2021 0915   BILIRUBINUR neg 04/08/2016 0916   KETONESUR NEGATIVE 07/10/2021 0915   PROTEINUR NEGATIVE 07/10/2021 0915   UROBILINOGEN negative 04/08/2016 0916   UROBILINOGEN 0.2 01/15/2013 1538   NITRITE NEGATIVE 07/10/2021 0915   LEUKOCYTESUR NEGATIVE 07/10/2021 0915   Sepsis Labs: @LABRCNTIP (procalcitonin:4,lacticidven:4) ) Recent Results (from the past 240 hour(s))  Blood Culture (routine x 2)     Status: Abnormal (Preliminary result)   Collection Time: 07/10/21  3:50 AM   Specimen: BLOOD  Result Value Ref Range Status   Specimen Description   Final    BLOOD LEFT ANTECUBITAL Performed at [facility], [place] [address], [place], [place]  [address]    Special Requests   Final    BOTTLES DRAWN AEROBIC AND ANAEROBIC Blood Culture adequate volume Performed at [facility] [address], [place], [place] [address]    Culture  Setup Time   Final    GRAM POSITIVE COCCI IN CLUSTERS IN BOTH AEROBIC AND ANAEROBIC BOTTLES CRITICAL RESULT CALLED TO, READ BACK BY AND VERIFIED WITH: J. LEGGE 07/11/21 1154 FH    Culture (A)  Final    STAPHYLOCOCCUS EPIDERMIDIS CULTURE REINCUBATED FOR BETTER GROWTH Performed at [facility], [place] [address], [place], [place] [address]    Report Status PENDING  Incomplete  Blood Culture ID Panel (Reflexed)     Status: Abnormal   Collection Time: 07/10/21  3:50 AM  Result Value Ref Range Status   Enterococcus faecalis NOT DETECTED NOT DETECTED Final   Enterococcus Faecium NOT DETECTED NOT DETECTED Final   Listeria monocytogenes NOT DETECTED NOT DETECTED Final   Staphylococcus species DETECTED (A) NOT DETECTED Final    Comment: CRITICAL RESULT CALLED TO, READ BACK BY AND VERIFIED WITH: J. LEGGE 07/11/21 1154 FH    Staphylococcus aureus (BCID) NOT DETECTED NOT DETECTED Final   Staphylococcus epidermidis DETECTED (A) NOT DETECTED Final    Comment: Methicillin (oxacillin) resistant coagulase negative  staphylococcus. Possible blood culture contaminant (unless isolated from more than one blood culture draw or clinical case suggests pathogenicity). No antibiotic treatment is indicated for blood  culture contaminants. CRITICAL RESULT CALLED TO, READ BACK BY AND VERIFIED WITH: J. LEGGE 07/11/21 1154 FH    Staphylococcus lugdunensis NOT DETECTED NOT DETECTED Final   Streptococcus species NOT DETECTED NOT DETECTED Final   Streptococcus agalactiae NOT DETECTED NOT DETECTED Final   Streptococcus pneumoniae NOT DETECTED NOT DETECTED Final   Streptococcus pyogenes NOT DETECTED NOT DETECTED Final   A.calcoaceticus-baumannii NOT DETECTED NOT DETECTED Final   Bacteroides fragilis NOT DETECTED NOT DETECTED Final   Enterobacterales NOT DETECTED NOT DETECTED Final   Enterobacter cloacae complex NOT DETECTED NOT DETECTED Final   Escherichia coli NOT DETECTED NOT DETECTED Final   Klebsiella aerogenes NOT DETECTED NOT DETECTED Final   Klebsiella oxytoca NOT DETECTED NOT DETECTED Final   Klebsiella pneumoniae NOT DETECTED NOT DETECTED Final   Proteus species NOT DETECTED NOT DETECTED Final   Salmonella species NOT DETECTED NOT DETECTED Final   Serratia marcescens NOT DETECTED NOT DETECTED Final   Haemophilus influenzae NOT DETECTED NOT DETECTED Final   Neisseria meningitidis NOT DETECTED NOT DETECTED Final   Pseudomonas aeruginosa NOT DETECTED NOT DETECTED Final   Stenotrophomonas maltophilia NOT DETECTED NOT DETECTED Final   Candida albicans NOT DETECTED NOT DETECTED Final   Candida auris NOT  DETECTED NOT DETECTED Final   Candida glabrata NOT DETECTED NOT DETECTED Final   Candida krusei NOT DETECTED NOT DETECTED Final   Candida parapsilosis NOT DETECTED NOT DETECTED Final   Candida tropicalis NOT DETECTED NOT DETECTED Final   Cryptococcus neoformans/gattii NOT DETECTED NOT DETECTED Final   Methicillin resistance mecA/C DETECTED (A) NOT DETECTED Final    Comment: CRITICAL RESULT CALLED TO, READ BACK  BY AND VERIFIED WITH: J. LEGGE 07/11/21 1154 FH Performed at [facility], [address], [place], [place] [address]   Urine Culture     Status: None   Collection Time: 07/10/21  9:15 AM   Specimen: In/Out Cath Urine  Result Value Ref Range Status   Specimen Description   Final    IN/OUT CATH URINE Performed at [facility] [address], [place], [place] [address]    Special Requests   Final    NONE Performed at [facility], [place] [address], [place], [place] [address]    Culture   Final    NO GROWTH Performed at [facility], [place] [address], [place], [place] [address]    Report Status 07/11/2021 FINAL  Final  Blood Culture (routine x 2)     Status: None (Preliminary result)   Collection Time: 07/10/21  7:09 PM   Specimen: BLOOD  Result Value Ref Range Status   Specimen Description   Final    BLOOD SITE NOT SPECIFIED Performed at [facility] [address] [place], [place] [address]    Special Requests   Final    BOTTLES DRAWN AEROBIC ONLY Blood Culture results may not be optimal due to an inadequate volume of blood received in culture bottles Performed at [facility] [address] [place], [place] [address]    Culture   Final    NO GROWTH 2 DAYS Performed at [facility] [address] [place], [place] [address]    Report Status PENDING  Incomplete         Radiology Studies: CT CHEST ABDOMEN PELVIS W CONTRAST  Result Date: 07/11/2021 CLINICAL DATA:  Unintended weight loss EXAM: CT CHEST, ABDOMEN, AND PELVIS WITH CONTRAST TECHNIQUE: Multidetector CT imaging of the chest, abdomen and pelvis was performed following the standard protocol during bolus administration of intravenous contrast. CONTRAST:  54mL OMNIPAQUE IOHEXOL 350 MG/ML SOLN COMPARISON:  CT chest 08/10/2020.  CT abdomen and pelvis 06/26/2021 FINDINGS: CT CHEST FINDINGS Cardiovascular: Mild cardiac  enlargement. Small pericardial effusion. Coronary artery and aortic calcifications. No aortic aneurysm. Mediastinum/Nodes: Esophagus is decompressed. Mediastinal lymph nodes are not pathologically enlarged. Thyroid gland is unremarkable. Lungs/Pleura: Small bilateral pleural effusions with basilar atelectasis or consolidation. Nodule in the right middle lung measuring 5 mm diameter. No change since prior study. No pneumothorax. Musculoskeletal: Degenerative changes in the spine. No destructive bone lesions. CT ABDOMEN PELVIS FINDINGS Hepatobiliary: Mild diffuse fatty infiltration of the liver. Cholelithiasis with small stones layering in the gallbladder. Additional noncalcified nodular filling defect in the body of the gallbladder measuring about 2.6 cm diameter. This could be a noncalcified stone, sludge, or polypoid mass. Ultrasound correlation is suggested. No bile duct dilatation. Pancreas: Fatty atrophy of the pancreas. Spleen: Normal in size without focal abnormality. Adrenals/Urinary Tract: No adrenal gland nodules. Bilateral renal parenchymal atrophy and scarring. No solid mass or hydronephrosis. Bladder is decompressed with a Foley catheter. Stomach/Bowel: Stomach, small bowel, and colon are not abnormally distended. Scattered colonic diverticula. No evidence  of diverticulitis. Appendix is normal. Vascular/Lymphatic: Scattered aortic calcification. No significant lymphadenopathy. Reproductive: Uterine calcifications, likely fibroids. 2.3 cm diameter structure in the right ovary containing fat and calcification, likely a small dermoid cyst. No change. Other: No free air or free fluid in the abdomen. Edema in the subcutaneous fat of the flank regions. Fatty atrophy of the abdominal wall musculature. Musculoskeletal: Degenerative changes in the lumbar spine. Lumbar scoliosis convex towards the right. No destructive bone lesions. IMPRESSION: 1. Cardiac enlargement with small pericardial effusion. 2. Small  bilateral pleural effusions with basilar atelectasis or consolidation. 3. 5 mm right solid pulmonary nodule. No routine follow-up imaging is recommended per Fleischner Society Guidelines. These guidelines do not apply to immunocompromised patients and patients with cancer. Follow up in patients with significant comorbidities as clinically warranted. For lung cancer screening, adhere to Lung-RADS guidelines. Reference: Radiology. 2017; 284(1):228-43. 4. Cholelithiasis. 5. Noncalcified structure in the gallbladder is nonspecific and could represent stone, sludge, or mass lesion. Ultrasound correlation suggested. 6. Fatty infiltration of the liver. 7. Aortic atherosclerosis. 8. Foley catheter decompresses the bladder. 9. Calcified uterine fibroids. 10. 2.3 cm right ovarian structure containing fat and calcification, likely a small dermoid cyst. No change. Electronically Signed   By: Lucienne Capers M.D.   On: 07/11/2021 15:19      Scheduled Meds:  (feeding supplement) PROSource Plus  30 mL Oral BID BM   ascorbic acid  500 mg Oral BID   Chlorhexidine Gluconate Cloth  6 each Topical Daily   cholecalciferol  5,000 Units Oral Daily   escitalopram  5 mg Oral QHS   folic acid  1 mg Oral Daily   lactobacillus acidophilus & bulgar  2 tablet Oral TID   levothyroxine  125 mcg Oral Q0600   pantoprazole  40 mg Oral BID AC   phytonadione  10 mg Oral Daily   sucralfate  1 g Oral TID WC & HS   zinc sulfate  220 mg Oral Daily   Continuous Infusions:   LOS: 1 day      Debbe Odea, MD [facility] Pager: [email or web address] 07/12/2021, 1:48 PM

## 2021-07-13 DIAGNOSIS — N183 Chronic kidney disease, stage 3 unspecified: Secondary | ICD-10-CM | POA: Diagnosis not present

## 2021-07-13 DIAGNOSIS — R601 Generalized edema: Secondary | ICD-10-CM | POA: Diagnosis not present

## 2021-07-13 DIAGNOSIS — E8809 Other disorders of plasma-protein metabolism, not elsewhere classified: Secondary | ICD-10-CM | POA: Diagnosis not present

## 2021-07-13 LAB — BASIC METABOLIC PANEL
Anion gap: 6 (ref 5–15)
BUN: 91 mg/dL — ABNORMAL HIGH (ref 8–23)
CO2: 18 mmol/L — ABNORMAL LOW (ref 22–32)
Calcium: 7.7 mg/dL — ABNORMAL LOW (ref 8.9–10.3)
Chloride: 116 mmol/L — ABNORMAL HIGH (ref 98–111)
Creatinine, Ser: 1.08 mg/dL — ABNORMAL HIGH (ref 0.44–1.00)
GFR, Estimated: 53 mL/min — ABNORMAL LOW (ref >60)
Glucose, Bld: 95 mg/dL (ref 70–99)
Potassium: 5.4 mmol/L — ABNORMAL HIGH (ref 3.5–5.1)
Sodium: 140 mmol/L (ref 135–145)

## 2021-07-13 LAB — CBC
HCT: 26.4 % — ABNORMAL LOW (ref 36.0–46.0)
Hemoglobin: 8.1 g/dL — ABNORMAL LOW (ref 12.0–15.0)
MCH: 29.8 pg (ref 26.0–34.0)
MCHC: 30.7 g/dL (ref 30.0–36.0)
MCV: 97.1 fL (ref 80.0–100.0)
Platelets: 102 10*3/uL — ABNORMAL LOW (ref 150–400)
RBC: 2.72 MIL/uL — ABNORMAL LOW (ref 3.87–5.11)
RDW: 23.7 % — ABNORMAL HIGH (ref 11.5–15.5)
WBC: 5.7 10*3/uL (ref 4.0–10.5)
nRBC: 0.4 % — ABNORMAL HIGH (ref 0.0–0.2)

## 2021-07-13 LAB — CULTURE, BLOOD (ROUTINE X 2): Special Requests: ADEQUATE

## 2021-07-13 LAB — IRON AND TIBC
Iron: 44 ug/dL (ref 28–170)
TIBC: <70 ug/dL — ABNORMAL LOW (ref 250–450)

## 2021-07-13 LAB — VITAMIN B12: Vitamin B-12: 4634 pg/mL — ABNORMAL HIGH (ref 180–914)

## 2021-07-13 LAB — PROTIME-INR
INR: 2.2 — ABNORMAL HIGH (ref 0.8–1.2)
Prothrombin Time: 24.3 seconds — ABNORMAL HIGH (ref 11.4–15.2)

## 2021-07-13 LAB — TISSUE TRANSGLUTAMINASE, IGA: Tissue Transglutaminase Ab, IgA: <2 U/mL (ref 0–3)

## 2021-07-13 LAB — APTT: aPTT: 43 seconds — ABNORMAL HIGH (ref 24–36)

## 2021-07-13 MED ORDER — VITAMIN K1 10 MG/ML IJ SOLN
10.0000 mg | Freq: Once | INTRAMUSCULAR | Status: AC
  Administered 2021-07-13: 10 mg via SUBCUTANEOUS
  Filled 2021-07-13: qty 1

## 2021-07-13 NOTE — Consult Note (Signed)
Face To Face Psychiatry Consult  Patient Identification:  Sally Wise Date of Evaluation:  07/13/2021 Referring Provider:Dr Rizwan   History of Present Illness: Omari is a 75 year old Caucasian, married female with history of A. fib, hypothyroidism, chronic thrombocytopenia, hypertension, depression admitted with weakness and lethargy.  Patient has fractured her ankle in October 22 and since then she is in SNF.  Patient has unable to walk.  Psychiatry consult was called because patient not eating and history of anxiety.  Patient seen and chart reviewed.  I also spoke to her husband who was present at the bedside.  Patient reported started to have anxiety few months ago her lack of appetite is chronic.  Though she do not notice any significant weight loss but had a extensive work-up by GI.  Patient was given antianxiety medicine by her PCP Dr. Harrington Challenger as she noticed lately feeling very anxious when she goes out of the house.  She is not sure why she has no appetite but denies any crying spells, paranoia, suicidal thoughts, anhedonia or any feeling of hopelessness.  Her husband and does the patient has no previous history of suicidal attempt, inpatient psychiatric treatment.  Though she had depression but she believes it is triggered by too many health issues and since she fractured her ankle.  Patient endorses being in the hospital making her more anxious and sometimes she feels tired and weak.  She denies any hallucination or any mood swings.  In the hospital she is calm and cooperative and there were no outburst behavior or agitation.  Her current medicine is Lexapro 5 mg.  She is not sure if it was given by her PCP.  Patient lives with her husband.  She has 2 daughter who lives in town and she is in frequent contact with them.  Past Psychiatric History:  Patient denies any history of previous suicidal attempt, psychiatric inpatient treatment.  She dont recall seeing a psychiatrist.  Her PCP has  prescribed anxiety medicine but she do not recall the details.  Patient denies any history of mania, psychosis, abuse.   Past Medical History:     Past Medical History:  Diagnosis Date   Hematuria    evaluation with Dr. Joelyn Oms   Hypothyroidism 06/13/2010   Kidney stone [id number]   Lichen sclerosus et atrophicus 08/15/2011   biopsy proven   LVH (left ventricular hypertrophy)    Morbid obesity (HCC)    PAF (paroxysmal atrial fibrillation) ([place])    a. Remote hx, re-established care 01/2013 with Dr. Johnsie Cancel for recurrence. 2D echo 01/2013: mod LVH, EF 55-65%, no RWMA, grade 1 d/d. b. 06/2014: started on Xarelto.    Thrombocytopenia ([place])    Vitamin D deficiency 06/13/2010   Wears glasses        Past Surgical History:  Procedure Laterality Date   BREAST EXCISIONAL BIOPSY  2010   left--was a vascular lesion   CARPAL TUNNEL RELEASE Left 08/09/2013   Procedure: LEFT CARPAL TUNNEL RELEASE;  Surgeon: Cammie Sickle., MD;  Location: [facility];  Service: Orthopedics;  Laterality: Left;   CATARACT EXTRACTION     COLONOSCOPY  04/2003       DILATION AND CURETTAGE OF UTERUS     HYSTEROSCOPY WITH D & C  8/99   ORIF ANKLE FRACTURE Left 05/07/2021   Procedure: OPEN REDUCTION INTERNAL FIXATION (ORIF) ANKLE FRACTURE;  Surgeon: Wylene Simmer, MD;  Location[facility];  Service: Orthopedics;  Laterality: Left;   REPLACEMENT TOTAL KNEE Bilateral  12/2006  07/2007    TONSILLECTOMY     TRIGGER FINGER RELEASE Left 08/09/2013   Procedure: LEFT A1/A2 PULLEY RELEASE;  Surgeon: Cammie Sickle., MD;  Location: [facility];  Service: Orthopedics;  Laterality: Left;   TUBAL LIGATION  5/77    Recent Results (from the past 2160 hour(s))  Comprehensive metabolic panel     Status: Abnormal   Collection Time: 04/28/21  3:40 PM  Result Value Ref Range   Sodium 135 135 - 145 mmol/L   Potassium 4.3 3.5 - 5.1 mmol/L   Chloride 105 98 - 111 mmol/L   CO2 24 22 - 32 mmol/L   Glucose,  Bld 113 (H) 70 - 99 mg/dL    Comment: Glucose reference range applies only to samples taken after fasting for at least 8 hours.   BUN 21 8 - 23 mg/dL   Creatinine, Ser 1.36 (H) 0.44 - 1.00 mg/dL   Calcium 8.1 (L) 8.9 - 10.3 mg/dL   Total Protein 6.4 (L) 6.5 - 8.1 g/dL   Albumin 2.9 (L) 3.5 - 5.0 g/dL   AST 11 (L) 15 - 41 U/L   ALT 11 0 - 44 U/L   Alkaline Phosphatase 269 (H) 38 - 126 U/L   Total Bilirubin 0.8 0.3 - 1.2 mg/dL   GFR, Estimated 40 (L) >60 mL/min    Comment: (NOTE) Calculated using the CKD-EPI Creatinine Equation (2021)    Anion gap 6 5 - 15    Comment: Performed at [facility], [place] [address] [place], [place] [address]  Lipase, blood     Status: None   Collection Time: 04/28/21  3:40 PM  Result Value Ref Range   Lipase 22 11 - 51 U/L    Comment: Performed at [facility], [place] [address], [place], [place] [address]  CBC with Differential     Status: Abnormal   Collection Time: 04/28/21  3:40 PM  Result Value Ref Range   WBC 6.1 4.0 - 10.5 K/uL   RBC 3.18 (L) 3.87 - 5.11 MIL/uL   Hemoglobin 9.3 (L) 12.0 - 15.0 g/dL   HCT 30.0 (L) 36.0 - 46.0 %   MCV 94.3 80.0 - 100.0 fL   MCH 29.2 26.0 - 34.0 pg   MCHC 31.0 30.0 - 36.0 g/dL   RDW 19.9 (H) 11.5 - 15.5 %   Platelets 101 (L) 150 - 400 K/uL    Comment: Immature Platelet Fraction may be clinically indicated, consider ordering this additional test [id number]    nRBC 0.0 0.0 - 0.2 %   Neutrophils Relative % 57 %   Neutro Abs 3.5 1.7 - 7.7 K/uL   Lymphocytes Relative 20 %   Lymphs Abs 1.2 0.7 - 4.0 K/uL   Monocytes Relative 17 %   Monocytes Absolute 1.0 0.1 - 1.0 K/uL   Eosinophils Relative 5 %   Eosinophils Absolute 0.3 0.0 - 0.5 K/uL   Basophils Relative 0 %   Basophils Absolute 0.0 0.0 - 0.1 K/uL   Immature Granulocytes 1 %   Abs Immature Granulocytes 0.04 0.00 - 0.07 K/uL    Comment: Performed at [facility], [place] [address], [place], [place]  [address]  Protime-INR     Status: Abnormal   Collection Time: 04/28/21  3:40 PM  Result Value Ref Range   Prothrombin Time 18.0 (H) 11.4 - 15.2 seconds   INR 1.5 (H) 0.8 - 1.2    Comment: (NOTE) INR goal  varies based on device and disease states. Performed at [facility], [place] [address] [place], [place] [address]   Troponin I (High Sensitivity)     Status: None   Collection Time: 04/28/21  3:40 PM  Result Value Ref Range   Troponin I (High Sensitivity) 5 <18 ng/L    Comment: (NOTE) Elevated high sensitivity troponin I (hsTnI) values and significant  changes across serial measurements may suggest ACS but many other  chronic and acute conditions are known to elevate hsTnI results.  Refer to the "Links" section for chest pain algorithms and additional  guidance. Performed at [facility], [place] [address], [place], [place] [address]   Troponin I (High Sensitivity)     Status: None   Collection Time: 04/28/21  7:15 PM  Result Value Ref Range   Troponin I (High Sensitivity) 5 <18 ng/L    Comment: (NOTE) Elevated high sensitivity troponin I (hsTnI) values and significant  changes across serial measurements may suggest ACS but many other  chronic and acute conditions are known to elevate hsTnI results.  Refer to the "Links" section for chest pain algorithms and additional  guidance. Performed at [facility], [place] [address], [place], [place] [address]   Magnesium     Status: None   Collection Time: 04/28/21  7:15 PM  Result Value Ref Range   Magnesium 2.0 1.7 - 2.4 mg/dL    Comment: Performed at [facility], [place] [address], [place], [place] [address]  Phosphorus     Status: None   Collection Time: 04/28/21  7:15 PM  Result Value Ref Range   Phosphorus 3.9 2.5 - 4.6 mg/dL    Comment: Performed at [facility], [place] [address], [place], [place] [address]  CK     Status: None   Collection  Time: 04/28/21  7:15 PM  Result Value Ref Range   Total CK 51 38 - 234 U/L    Comment: Performed at [facility], [place] [address] [place], [place] [address]  Gamma GT     Status: None   Collection Time: 04/28/21  7:15 PM  Result Value Ref Range   GGT 28 7 - 50 U/L    Comment: Performed at [facility], [place] [address] [place], [place] [address]  Resp Panel by RT-PCR (Flu A&B, Covid) Nasopharyngeal Swab     Status: None   Collection Time: 04/28/21  7:21 PM   Specimen: Nasopharyngeal Swab; Nasopharyngeal(NP) swabs in vial transport medium  Result Value Ref Range   SARS Coronavirus 2 by RT PCR NEGATIVE NEGATIVE    Comment: (NOTE) SARS-CoV-2 target nucleic acids are NOT DETECTED.  The SARS-CoV-2 RNA is generally detectable in upper respiratory specimens during the acute phase of infection. The lowest concentration of SARS-CoV-2 viral copies this assay can detect is 138 copies/mL. A negative result does not preclude SARS-Cov-2 infection and should not be used as the sole basis for treatment or other patient management decisions. A negative result may occur with  improper specimen collection/handling, submission of specimen other than nasopharyngeal swab, presence of viral mutation(s) within the areas targeted by this assay, and inadequate number of viral copies(<138 copies/mL). A negative result must be combined with clinical observations, patient history, and epidemiological information. The expected result is Negative.  Fact Sheet for Patients:  EntrepreneurPulse.com.au  Fact Sheet for Healthcare Providers:  IncredibleEmployment.be  This test is no t yet approved or cleared by the  [place] Fut[employer] trader and  has been authorized for detection and/or diagnosis of SARS-CoV-2 by FDA under an TEFL teacher (EUA). This EUA will remain  in effect (meaning this test can be used) for the duration of  the COVID-19 declaration under Section 564(b)(1) of the Act, 21 U.S.C.section 360bbb-3(b)(1), unless the authorization is terminated  or revoked sooner.       Influenza A by PCR NEGATIVE NEGATIVE   Influenza B by PCR NEGATIVE NEGATIVE    Comment: (NOTE) The Xpert Xpress SARS-CoV-2/FLU/RSV plus assay is intended as an aid in the diagnosis of influenza from Nasopharyngeal swab specimens and should not be used as a sole basis for treatment. Nasal washings and aspirates are unacceptable for Xpert Xpress SARS-CoV-2/FLU/RSV testing.  Fact Sheet for Patients: BloggerCourse.com  Fact Sheet for Healthcare Providers: SeriousBroker.it  This test is not yet approved or cleared by the [place] FDA and has been authorized for detection and/or diagnosis of SARS-CoV-2 by FDA under an Emergency Use Authorization (EUA). This EUA will remain in effect (meaning this test can be used) for the duration of the COVID-19 declaration under Section 564(b)(1) of the Act, 21 U.S.C. section 360bbb-3(b)(1), unless the authorization is terminated or revoked.  Performed at [facility], [address] [place], [place] [address]   Type and screen     Status: None   Collection Time: 04/28/21 10:51 PM  Result Value Ref Range   ABO/RH(D) A POS    Antibody Screen NEG    Sample Expiration 05/01/2021,2359    Unit Number [id number]    Blood Component Type RED CELLS,LR    Unit division 00    Status of Unit ISSUED,FINAL    Transfusion Status OK TO TRANSFUSE    Crossmatch Result      Compatible Performed at [facility], [address] [place], [place] [address]   VITAMIN D 25 Hydroxy (Vit-D Deficiency, Fractures)     Status: None   Collection Time: 04/28/21 10:51 PM  Result Value Ref Range   Vit D, 25-Hydroxy 69.59 30 - 100 ng/mL    Comment: (NOTE) Vitamin D deficiency has been defined by the Institute of  Medicine  and an Endocrine Society practice guideline as a level of serum 25-OH  vitamin D less than 20 ng/mL (1,2). The Endocrine Society went on to  further define vitamin D insufficiency as a level between 21 and 29  ng/mL (2).  1. IOM (Institute of Medicine). 2010. Dietary reference intakes for  calcium and D. [place]: The [employer]. 2. Holick MF, Binkley Luther, Bischoff-Ferrari HA, et al. Evaluation,  treatment, and prevention of vitamin D deficiency: an Endocrine  Society clinical practice guideline, JCEM. 2011 Jul; 96(7): 1911-30.  Performed at [facility], [address], [place], [place] [address]   APTT     Status: None   Collection Time: 04/28/21 10:51 PM  Result Value Ref Range   aPTT 35 24 - 36 seconds    Comment: Performed at [facility], [address], [place], [place] [address]  BPAM RBC     Status: None   Collection Time: 04/28/21 10:51 PM  Result Value Ref Range   ISSUE DATE / TIME [id number]    Blood Product Unit Number [id number]    PRODUCT CODE [id number]    Unit Type and Rh 6200    Blood Product Expiration Date 825003704888   Vitamin B12     Status: Abnormal  Collection Time: 04/29/21  5:18 AM  Result Value Ref Range   Vitamin B-12 3,928 (H) 180 - 914 pg/mL    Comment: RESULTS CONFIRMED BY MANUAL DILUTION (NOTE) This assay is not validated for testing neonatal or myeloproliferative syndrome specimens for Vitamin B12 levels. Performed at [facility], [place] [address] [place], [place] [address]   Folate     Status: None   Collection Time: 04/29/21  5:18 AM  Result Value Ref Range   Folate 19.1 >5.9 ng/mL    Comment: Performed at [facility], [place] [address], [place], [place] [address]  Iron and TIBC     Status: Abnormal   Collection Time: 04/29/21  5:18 AM  Result Value Ref Range   Iron 28 28 - 170 ug/dL   TIBC 131 (L) 250 - 450 ug/dL   Saturation Ratios 21 10.4  - 31.8 %   UIBC 103 ug/dL    Comment: Performed at [facility], [place] [address] [place], [place] [address]  Ferritin     Status: None   Collection Time: 04/29/21  5:18 AM  Result Value Ref Range   Ferritin 229 11 - 307 ng/mL    Comment: Performed at [facility], [place] [address], [place], [place] [address]  Reticulocytes     Status: Abnormal   Collection Time: 04/29/21  5:18 AM  Result Value Ref Range   Retic Ct Pct 1.8 0.4 - 3.1 %   RBC. 2.57 (L) 3.87 - 5.11 MIL/uL   Retic Count, Absolute 45.5 19.0 - 186.0 K/uL   Immature Retic Fract 26.8 (H) 2.3 - 15.9 %    Comment: Performed at [facility], [place] [address], [place], [place] [address]  TSH     Status: None   Collection Time: 04/29/21  5:18 AM  Result Value Ref Range   TSH 3.944 0.350 - 4.500 uIU/mL    Comment: Performed by a 3rd Generation assay with a functional sensitivity of <=0.01 uIU/mL. Performed at [facility], [place] [address], [place], [place] [address]   CBC     Status: Abnormal   Collection Time: 04/29/21  5:18 AM  Result Value Ref Range   WBC 5.4 4.0 - 10.5 K/uL   RBC 2.60 (L) 3.87 - 5.11 MIL/uL   Hemoglobin 7.7 (L) 12.0 - 15.0 g/dL   HCT 24.7 (L) 36.0 - 46.0 %   MCV 95.0 80.0 - 100.0 fL   MCH 29.6 26.0 - 34.0 pg   MCHC 31.2 30.0 - 36.0 g/dL   RDW 19.7 (H) 11.5 - 15.5 %   Platelets 85 (L) 150 - 400 K/uL    Comment: Immature Platelet Fraction may be clinically indicated, consider ordering this additional test [id number] PLATELET COUNT CONFIRMED BY SMEAR    nRBC 0.0 0.0 - 0.2 %    Comment: Performed at [facility], [place] [address] [place], [place] [id number]  Basic metabolic panel     Status: Abnormal   Collection Time: 04/29/21  5:18 AM  Result Value Ref Range   Sodium 136 135 - 145 mmol/L   Potassium 3.7 3.5 - 5.1 mmol/L   Chloride 106 98 - 111 mmol/L   CO2 23 22 - 32 mmol/L   Glucose, Bld 86 70 - 99 mg/dL     Comment: Glucose reference range applies only to samples taken after fasting for at least 8 hours.   BUN 26 (H) 8 - 23 mg/dL  Creatinine, Ser 1.56 (H) 0.44 - 1.00 mg/dL   Calcium 7.9 (L) 8.9 - 10.3 mg/dL   GFR, Estimated 34 (L) >60 mL/min    Comment: (NOTE) Calculated using the CKD-EPI Creatinine Equation (2021)    Anion gap 7 5 - 15    Comment: Performed at [facility], [place] [address] [place], [place] [address]  CBC     Status: Abnormal   Collection Time: 04/30/21  4:46 AM  Result Value Ref Range   WBC 4.9 4.0 - 10.5 K/uL   RBC 2.55 (L) 3.87 - 5.11 MIL/uL   Hemoglobin 7.5 (L) 12.0 - 15.0 g/dL   HCT 24.7 (L) 36.0 - 46.0 %   MCV 96.9 80.0 - 100.0 fL   MCH 29.4 26.0 - 34.0 pg   MCHC 30.4 30.0 - 36.0 g/dL   RDW 20.1 (H) 11.5 - 15.5 %   Platelets 92 (L) 150 - 400 K/uL    Comment: Immature Platelet Fraction may be clinically indicated, consider ordering this additional test [id number] CONSISTENT WITH PREVIOUS RESULT REPEATED TO VERIFY    nRBC 0.0 0.0 - 0.2 %    Comment: Performed at [facility], [place] [address] [place], [place] [address]  Comprehensive metabolic panel     Status: Abnormal   Collection Time: 04/30/21  2:38 PM  Result Value Ref Range   Sodium 133 (L) 135 - 145 mmol/L   Potassium 4.0 3.5 - 5.1 mmol/L   Chloride 104 98 - 111 mmol/L   CO2 21 (L) 22 - 32 mmol/L   Glucose, Bld 103 (H) 70 - 99 mg/dL    Comment: Glucose reference range applies only to samples taken after fasting for at least 8 hours.   BUN 29 (H) 8 - 23 mg/dL   Creatinine, Ser 1.53 (H) 0.44 - 1.00 mg/dL   Calcium 7.7 (L) 8.9 - 10.3 mg/dL   Total Protein 5.8 (L) 6.5 - 8.1 g/dL   Albumin 2.5 (L) 3.5 - 5.0 g/dL   AST 18 15 - 41 U/L   ALT 12 0 - 44 U/L   Alkaline Phosphatase 240 (H) 38 - 126 U/L   Total Bilirubin 0.4 0.3 - 1.2 mg/dL   GFR, Estimated 35 (L) >60 mL/min    Comment: (NOTE) Calculated using the CKD-EPI Creatinine Equation (2021)    Anion gap 8 5 -  15    Comment: Performed at [facility], [place] [address], [place], [place] [address]  Prepare RBC (crossmatch)     Status: None   Collection Time: 05/01/21  1:31 PM  Result Value Ref Range   Order Confirmation      ORDER PROCESSED BY BLOOD BANK Performed at [facility] [place], [place] [address] [place], [place] [id number]   Basic metabolic panel     Status: Abnormal   Collection Time: 05/01/21  1:37 PM  Result Value Ref Range   Sodium 137 135 - 145 mmol/L   Potassium 4.1 3.5 - 5.1 mmol/L   Chloride 107 98 - 111 mmol/L   CO2 24 22 - 32 mmol/L   Glucose, Bld 125 (H) 70 - 99 mg/dL    Comment: Glucose reference range applies only to samples taken after fasting for at least 8 hours.   BUN 27 (H) 8 - 23 mg/dL   Creatinine, Ser 1.15 (H) 0.44 - 1.00 mg/dL   Calcium 7.9 (L) 8.9 - 10.3 mg/dL   GFR, Estimated 49 (L) >60 mL/min  Comment: (NOTE) Calculated using the CKD-EPI Creatinine Equation (2021)    Anion gap 6 5 - 15    Comment: Performed at [facility], [address] [place], [place] [address]  CBC     Status: Abnormal   Collection Time: 05/01/21  9:17 PM  Result Value Ref Range   WBC 6.3 4.0 - 10.5 K/uL   RBC 2.89 (L) 3.87 - 5.11 MIL/uL   Hemoglobin 8.5 (L) 12.0 - 15.0 g/dL   HCT 08.627.3 (L) 57.836.0 - 46.946.0 %   MCV 94.5 80.0 - 100.0 fL   MCH 29.4 26.0 - 34.0 pg   MCHC 31.1 30.0 - 36.0 g/dL   RDW 62.919.2 (H) 52.811.5 - 41.315.5 %   Platelets 86 (L) 150 - 400 K/uL    Comment: SPECIMEN CHECKED FOR CLOTS Immature Platelet Fraction may be clinically indicated, consider ordering this additional test [id number] CONSISTENT WITH PREVIOUS RESULT REPEATED TO VERIFY    nRBC 0.3 (H) 0.0 - 0.2 %    Comment: Performed at [facility], [address] [place], [place] [address]  Basic metabolic panel     Status: Abnormal   Collection Time: 05/02/21  5:16 AM  Result Value Ref Range   Sodium 133 (L) 135 - 145 mmol/L   Potassium 4.6 3.5  - 5.1 mmol/L   Chloride 104 98 - 111 mmol/L   CO2 24 22 - 32 mmol/L   Glucose, Bld 93 70 - 99 mg/dL    Comment: Glucose reference range applies only to samples taken after fasting for at least 8 hours.   BUN 27 (H) 8 - 23 mg/dL   Creatinine, Ser 4.401.21 (H) 0.44 - 1.00 mg/dL   Calcium 8.0 (L) 8.9 - 10.3 mg/dL   GFR, Estimated 46 (L) >60 mL/min    Comment: (NOTE) Calculated using the CKD-EPI Creatinine Equation (2021)    Anion gap 5 5 - 15    Comment: Performed at [facility], [address] [place], [place] [address]  CBC     Status: Abnormal   Collection Time: 05/02/21  5:16 AM  Result Value Ref Range   WBC 6.5 4.0 - 10.5 K/uL   RBC 3.05 (L) 3.87 - 5.11 MIL/uL   Hemoglobin 8.9 (L) 12.0 - 15.0 g/dL   HCT 59.528.7 (L) 63.836.0 - 75.646.0 %   MCV 94.1 80.0 - 100.0 fL   MCH 29.2 26.0 - 34.0 pg   MCHC 31.0 30.0 - 36.0 g/dL   RDW 43.319.5 (H) 29.511.5 - 18.815.5 %   Platelets 88 (L) 150 - 400 K/uL    Comment: Immature Platelet Fraction may be clinically indicated, consider ordering this additional test [id number] CONSISTENT WITH PREVIOUS RESULT REPEATED TO VERIFY    nRBC 0.3 (H) 0.0 - 0.2 %    Comment: Performed at [facility], [address] [place], [place] [address]  Magnesium     Status: None   Collection Time: 05/02/21  4:31 PM  Result Value Ref Range   Magnesium 2.1 1.7 - 2.4 mg/dL    Comment: Performed at [facility], [address] [place], [place] [address]  CBC     Status: Abnormal   Collection Time: 05/03/21  4:53 AM  Result Value Ref Range   WBC 6.0 4.0 - 10.5 K/uL   RBC 2.85 (L) 3.87 - 5.11 MIL/uL   Hemoglobin 8.4 (L) 12.0 - 15.0 g/dL   HCT 57.327.0 (L) 22.036.0 - 25.446.0 %  MCV 94.7 80.0 - 100.0 fL   MCH 29.5 26.0 - 34.0 pg   MCHC 31.1 30.0 - 36.0 g/dL   RDW 19.5 (H) 11.5 - 15.5 %   Platelets 90 (L) 150 - 400 K/uL    Comment: SPECIMEN CHECKED FOR CLOTS Immature Platelet Fraction may be clinically indicated, consider ordering this  additional test [id number] CONSISTENT WITH PREVIOUS RESULT REPEATED TO VERIFY    nRBC 0.3 (H) 0.0 - 0.2 %    Comment: Performed at [facility], [place] [address] [place], [place] [id number]  Basic metabolic panel     Status: Abnormal   Collection Time: 05/03/21  4:53 AM  Result Value Ref Range   Sodium 135 135 - 145 mmol/L   Potassium 4.6 3.5 - 5.1 mmol/L   Chloride 106 98 - 111 mmol/L   CO2 24 22 - 32 mmol/L   Glucose, Bld 99 70 - 99 mg/dL    Comment: Glucose reference range applies only to samples taken after fasting for at least 8 hours.   BUN 30 (H) 8 - 23 mg/dL   Creatinine, Ser 1.14 (H) 0.44 - 1.00 mg/dL   Calcium 7.9 (L) 8.9 - 10.3 mg/dL   GFR, Estimated 50 (L) >60 mL/min    Comment: (NOTE) Calculated using the CKD-EPI Creatinine Equation (2021)    Anion gap 5 5 - 15    Comment: Performed at [facility], [place] [address], [place], [place] [address]  Surgical pcr screen     Status: None   Collection Time: 05/07/21  1:09 AM   Specimen: Nasal Mucosa; Nasal Swab  Result Value Ref Range   MRSA, PCR NEGATIVE NEGATIVE   Staphylococcus aureus NEGATIVE NEGATIVE    Comment: (NOTE) The Xpert SA Assay (FDA approved for NASAL specimens in patients 34 years of age and older), is one component of a comprehensive surveillance program. It is not intended to diagnose infection nor to guide or monitor treatment. Performed at [facility], [place] [address] [place], [place] [address]   CBC     Status: Abnormal   Collection Time: 05/07/21  5:40 AM  Result Value Ref Range   WBC 5.5 4.0 - 10.5 K/uL   RBC 2.88 (L) 3.87 - 5.11 MIL/uL   Hemoglobin 8.4 (L) 12.0 - 15.0 g/dL   HCT 27.2 (L) 36.0 - 46.0 %   MCV 94.4 80.0 - 100.0 fL   MCH 29.2 26.0 - 34.0 pg   MCHC 30.9 30.0 - 36.0 g/dL   RDW 19.5 (H) 11.5 - 15.5 %   Platelets 100 (L) 150 - 400 K/uL    Comment: Immature Platelet Fraction may be clinically indicated, consider ordering this  additional test [id number]    nRBC 0.4 (H) 0.0 - 0.2 %    Comment: Performed at [facility], [place] [address], [place], [place] [id number]  Basic metabolic panel     Status: Abnormal   Collection Time: 05/07/21  5:40 AM  Result Value Ref Range   Sodium 135 135 - 145 mmol/L   Potassium 5.0 3.5 - 5.1 mmol/L   Chloride 102 98 - 111 mmol/L   CO2 25 22 - 32 mmol/L   Glucose, Bld 89 70 - 99 mg/dL    Comment: Glucose reference range applies only to samples taken after fasting for at least 8 hours.   BUN 30 (H) 8 - 23 mg/dL   Creatinine, Ser 1.02 (H) 0.44 - 1.00 mg/dL   Calcium  8.2 (L) 8.9 - 10.3 mg/dL   GFR, Estimated 57 (L) >60 mL/min    Comment: (NOTE) Calculated using the CKD-EPI Creatinine Equation (2021)    Anion gap 8 5 - 15    Comment: Performed at [facility], [place] [address] [place], [place] [address]  Type and screen [place]     Status: None   Collection Time: 05/07/21  2:25 PM  Result Value Ref Range   ABO/RH(D) A POS    Antibody Screen NEG    Sample Expiration 05/10/2021,2359    Unit Number [id number]    Blood Component Type RED CELLS,LR    Unit division 00    Status of Unit ISSUED,FINAL    Transfusion Status OK TO TRANSFUSE    Crossmatch Result      Compatible Performed at [facility], [place] [address], [place], [place] [address]   BPAM RBC     Status: None   Collection Time: 05/07/21  2:25 PM  Result Value Ref Range   ISSUE DATE / TIME [id number]    Blood Product Unit Number [id number]    PRODUCT CODE [id number]    Unit Type and Rh [id number]    Blood Product Expiration Date [id number]   ABO/Rh     Status: None   Collection Time: 05/07/21  2:26 PM  Result Value Ref Range   ABO/RH(D)      A POS Performed at [facility], [place] [address], [place], [place] [address]   CBC     Status: Abnormal   Collection Time: 05/08/21  5:12 AM  Result Value Ref Range   WBC 3.8  (L) 4.0 - 10.5 K/uL   RBC 2.67 (L) 3.87 - 5.11 MIL/uL   Hemoglobin 7.8 (L) 12.0 - 15.0 g/dL   HCT 25.8 (L) 36.0 - 46.0 %   MCV 96.6 80.0 - 100.0 fL   MCH 29.2 26.0 - 34.0 pg   MCHC 30.2 30.0 - 36.0 g/dL   RDW 19.6 (H) 11.5 - 15.5 %   Platelets 98 (L) 150 - 400 K/uL    Comment: SPECIMEN CHECKED FOR CLOTS Immature Platelet Fraction may be clinically indicated, consider ordering this additional test [id number] CONSISTENT WITH PREVIOUS RESULT REPEATED TO VERIFY    nRBC 0.0 0.0 - 0.2 %    Comment: Performed at [facility], [place] [address] [place], [place] [id number]  Basic metabolic panel     Status: Abnormal   Collection Time: 05/08/21  5:12 AM  Result Value Ref Range   Sodium 133 (L) 135 - 145 mmol/L   Potassium 5.0 3.5 - 5.1 mmol/L   Chloride 102 98 - 111 mmol/L   CO2 22 22 - 32 mmol/L   Glucose, Bld 114 (H) 70 - 99 mg/dL    Comment: Glucose reference range applies only to samples taken after fasting for at least 8 hours.   BUN 27 (H) 8 - 23 mg/dL   Creatinine, Ser 0.95 0.44 - 1.00 mg/dL   Calcium 8.2 (L) 8.9 - 10.3 mg/dL   GFR, Estimated >60 >60 mL/min    Comment: (NOTE) Calculated using the CKD-EPI Creatinine Equation (2021)    Anion gap 9 5 - 15    Comment: Performed at [facility], [place] [address] [place], [place] [address]  CBC     Status: Abnormal   Collection Time: 05/09/21  5:07 AM  Result Value Ref Range   WBC 5.7 4.0 - 10.5  K/uL   RBC 2.28 (L) 3.87 - 5.11 MIL/uL   Hemoglobin 6.7 (LL) 12.0 - 15.0 g/dL    Comment: REPEATED TO VERIFY THIS CRITICAL RESULT HAS VERIFIED AND BEEN CALLED TO RN J PILLEP BY ALEXIS Antigo ON 10 27 2022 AT 0536, AND HAS BEEN READ BACK.     HCT 21.9 (L) 36.0 - 46.0 %   MCV 96.1 80.0 - 100.0 fL   MCH 29.4 26.0 - 34.0 pg   MCHC 30.6 30.0 - 36.0 g/dL   RDW 19.5 (H) 11.5 - 15.5 %   Platelets 92 (L) 150 - 400 K/uL    Comment: Immature Platelet Fraction may be clinically indicated, consider ordering  this additional test [id number] CONSISTENT WITH PREVIOUS RESULT REPEATED TO VERIFY    nRBC 0.0 0.0 - 0.2 %    Comment: Performed at [facility], [place] [address], [place], [place] [id number]  Basic metabolic panel     Status: Abnormal   Collection Time: 05/09/21  5:07 AM  Result Value Ref Range   Sodium 137 135 - 145 mmol/L   Potassium 4.4 3.5 - 5.1 mmol/L   Chloride 106 98 - 111 mmol/L   CO2 25 22 - 32 mmol/L   Glucose, Bld 88 70 - 99 mg/dL    Comment: Glucose reference range applies only to samples taken after fasting for at least 8 hours.   BUN 30 (H) 8 - 23 mg/dL   Creatinine, Ser 1.08 (H) 0.44 - 1.00 mg/dL   Calcium 8.2 (L) 8.9 - 10.3 mg/dL   GFR, Estimated 53 (L) >60 mL/min    Comment: (NOTE) Calculated using the CKD-EPI Creatinine Equation (2021)    Anion gap 6 5 - 15    Comment: Performed at [facility], [place] [address] [place], [place] [address]  Reticulocytes     Status: Abnormal   Collection Time: 05/09/21  5:21 AM  Result Value Ref Range   Retic Ct Pct 2.9 0.4 - 3.1 %   RBC. 2.31 (L) 3.87 - 5.11 MIL/uL   Retic Count, Absolute 67.2 19.0 - 186.0 K/uL   Immature Retic Fract 29.0 (H) 2.3 - 15.9 %    Comment: Performed at [facility], [place] [address] [place], [place] [address]  Prepare RBC (crossmatch)     Status: None   Collection Time: 05/09/21  5:57 AM  Result Value Ref Range   Order Confirmation      ORDER PROCESSED BY BLOOD BANK Performed at [facility], [place] [address] [place], [place] [address]   Hemoglobin and hematocrit, blood     Status: Abnormal   Collection Time: 05/09/21  6:52 PM  Result Value Ref Range   Hemoglobin 8.8 (L) 12.0 - 15.0 g/dL    Comment: REPEATED TO VERIFY POST TRANSFUSION SPECIMEN    HCT 28.4 (L) 36.0 - 46.0 %    Comment: Performed at [facility], [place] [address], [place],  [address]  CBC     Status: Abnormal   Collection Time:  05/10/21 10:27 AM  Result Value Ref Range   WBC 4.1 4.0 - 10.5 K/uL   RBC 3.14 (L) 3.87 - 5.11 MIL/uL   Hemoglobin 9.2 (L) 12.0 - 15.0 g/dL   HCT 30.0 (L) 36.0 - 46.0 %   MCV 95.5 80.0 - 100.0 fL   MCH 29.3 26.0 - 34.0 pg   MCHC 30.7 30.0 - 36.0 g/dL   RDW 19.3 (H) 11.5 - 15.5 %  Platelets 95 (L) 150 - 400 K/uL    Comment: Immature Platelet Fraction may be clinically indicated, consider ordering this additional test [id number] CONSISTENT WITH PREVIOUS RESULT    nRBC 0.5 (H) 0.0 - 0.2 %    Comment: Performed at [facility], [place] [address] [place], [place] [address]  Pathologist smear review     Status: None   Collection Time: 05/10/21 10:27 AM  Result Value Ref Range   Path Review Reviewed by Chrystie Nose. Saralyn Pilar, M.D.     Comment: 10.31.22 NORMOCYTIC ANEMIA WITH ANISOCYTOSIS Performed at [place] [address], [place], [place] [address]   Resp Panel by RT-PCR (Flu A&B, Covid) Nasopharyngeal Swab     Status: None   Collection Time: 05/13/21 11:12 AM   Specimen: Nasopharyngeal Swab; Nasopharyngeal(NP) swabs in vial transport medium  Result Value Ref Range   SARS Coronavirus 2 by RT PCR NEGATIVE NEGATIVE    Comment: (NOTE) SARS-CoV-2 target nucleic acids are NOT DETECTED.  The SARS-CoV-2 RNA is generally detectable in upper respiratory specimens during the acute phase of infection. The lowest concentration of SARS-CoV-2 viral copies this assay can detect is 138 copies/mL. A negative result does not preclude SARS-Cov-2 infection and should not be used as the sole basis for treatment or other patient management decisions. A negative result may occur with  improper specimen collection/handling, submission of specimen other than nasopharyngeal swab, presence of viral mutation(s) within the areas targeted by this assay, and inadequate number of viral copies(<138 copies/mL). A negative result must be combined with clinical observations,  patient history, and epidemiological information. The expected result is Negative.  Fact Sheet for Patients:  EntrepreneurPulse.com.au  Fact Sheet for Healthcare Providers:  IncredibleEmployment.be  This test is no t yet approved or cleared by the [place] FDA and  has been authorized for detection and/or diagnosis of SARS-CoV-2 by FDA under an Emergency Use Authorization (EUA). This EUA will remain  in effect (meaning this test can be used) for the duration of the COVID-19 declaration under Section 564(b)(1) of the Act, 21 U.S.C.section 360bbb-3(b)(1), unless the authorization is terminated  or revoked sooner.       Influenza A by PCR NEGATIVE NEGATIVE   Influenza B by PCR NEGATIVE NEGATIVE    Comment: (NOTE) The Xpert Xpress SARS-CoV-2/FLU/RSV plus assay is intended as an aid in the diagnosis of influenza from Nasopharyngeal swab specimens and should not be used as a sole basis for treatment. Nasal washings and aspirates are unacceptable for Xpert Xpress SARS-CoV-2/FLU/RSV testing.  Fact Sheet for Patients: EntrepreneurPulse.com.au  Fact Sheet for Healthcare Providers: IncredibleEmployment.be  This test is not yet approved or cleared by the [place] FDA and has been authorized for detection and/or diagnosis of SARS-CoV-2 by FDA under an Emergency Use Authorization (EUA). This EUA will remain in effect (meaning this test can be used) for the duration of the COVID-19 declaration under Section 564(b)(1) of the Act, 21 U.S.C. section 360bbb-3(b)(1), unless the authorization is terminated or revoked.  Performed at [facility], [place] [address] [place], [place] [address]   CBG monitoring, ED     Status: None   Collection Time: 06/24/21  4:55 PM  Result Value Ref Range   Glucose-Capillary 98 70 - 99 mg/dL    Comment: Glucose reference range applies only to samples taken after  fasting for at least 8 hours.  Urinalysis, Routine w reflex microscopic Urine, In & Out Cath     Status: Abnormal  Collection Time: 06/24/21  5:19 PM  Result Value Ref Range   Color, Urine YELLOW YELLOW   APPearance HAZY (A) CLEAR   Specific Gravity, Urine 1.015 1.005 - 1.030   pH 5.0 5.0 - 8.0   Glucose, UA NEGATIVE NEGATIVE mg/dL   Hgb urine dipstick LARGE (A) NEGATIVE   Bilirubin Urine NEGATIVE NEGATIVE   Ketones, ur NEGATIVE NEGATIVE mg/dL   Protein, ur NEGATIVE NEGATIVE mg/dL   Nitrite NEGATIVE NEGATIVE   Leukocytes,Ua SMALL (A) NEGATIVE   RBC / HPF >50 (H) 0 - 5 RBC/hpf   WBC, UA 11-20 0 - 5 WBC/hpf   Bacteria, UA RARE (A) NONE SEEN   Squamous Epithelial / LPF 0-5 0 - 5   Mucus PRESENT    Hyaline Casts, UA PRESENT    Amorphous [place] PRESENT     Comment: Performed at [facility], [place] [address], [place], [place] [address]  CBC with Differential     Status: Abnormal   Collection Time: 06/24/21  6:07 PM  Result Value Ref Range   WBC 9.3 4.0 - 10.5 K/uL   RBC 2.71 (L) 3.87 - 5.11 MIL/uL   Hemoglobin 8.3 (L) 12.0 - 15.0 g/dL   HCT 26.6 (L) 36.0 - 46.0 %   MCV 98.2 80.0 - 100.0 fL   MCH 30.6 26.0 - 34.0 pg   MCHC 31.2 30.0 - 36.0 g/dL   RDW 22.8 (H) 11.5 - 15.5 %   Platelets 56 (L) 150 - 400 K/uL    Comment: SPECIMEN CHECKED FOR CLOTS Immature Platelet Fraction may be clinically indicated, consider ordering this additional test [id number] REPEATED TO VERIFY PLATELET COUNT CONFIRMED BY SMEAR    nRBC 0.5 (H) 0.0 - 0.2 %   Neutrophils Relative % 65 %   Neutro Abs 6.1 1.7 - 7.7 K/uL   Lymphocytes Relative 21 %   Lymphs Abs 2.0 0.7 - 4.0 K/uL   Monocytes Relative 11 %   Monocytes Absolute 1.1 (H) 0.1 - 1.0 K/uL   Eosinophils Relative 2 %   Eosinophils Absolute 0.2 0.0 - 0.5 K/uL   Basophils Relative 0 %   Basophils Absolute 0.0 0.0 - 0.1 K/uL   Immature Granulocytes 1 %   Abs Immature Granulocytes 0.05 0.00 - 0.07 K/uL   Schistocytes PRESENT     Burr Cells PRESENT    Pappenheimer Bodies PRESENT    Target Cells PRESENT    Ovalocytes PRESENT     Comment: Performed at [facility], [place] [address] [place], [place] [address]  Comprehensive metabolic panel     Status: Abnormal   Collection Time: 06/24/21  6:07 PM  Result Value Ref Range   Sodium 134 (L) 135 - 145 mmol/L   Potassium 4.3 3.5 - 5.1 mmol/L   Chloride 101 98 - 111 mmol/L   CO2 22 22 - 32 mmol/L   Glucose, Bld 118 (H) 70 - 99 mg/dL    Comment: Glucose reference range applies only to samples taken after fasting for at least 8 hours.   BUN 62 (H) 8 - 23 mg/dL   Creatinine, Ser 2.44 (H) 0.44 - 1.00 mg/dL   Calcium 8.0 (L) 8.9 - 10.3 mg/dL   Total Protein 5.6 (L) 6.5 - 8.1 g/dL   Albumin 2.4 (L) 3.5 - 5.0 g/dL   AST 13 (L) 15 - 41 U/L   ALT 18 0 - 44 U/L   Alkaline Phosphatase 273 (H) 38 - 126 U/L   Total Bilirubin 0.9  0.3 - 1.2 mg/dL   GFR, Estimated 20 (L) >60 mL/min    Comment: (NOTE) Calculated using the CKD-EPI Creatinine Equation (2021)    Anion gap 11 5 - 15    Comment: Performed at [facility], [place] [address], [place], [place] [address]  Troponin I (High Sensitivity)     Status: None   Collection Time: 06/24/21  6:07 PM  Result Value Ref Range   Troponin I (High Sensitivity) 8 <18 ng/L    Comment: (NOTE) Elevated high sensitivity troponin I (hsTnI) values and significant  changes across serial measurements may suggest ACS but many other  chronic and acute conditions are known to elevate hsTnI results.  Refer to the "Links" section for chest pain algorithms and additional  guidance. Performed at [facility], [place] [address], [place], [place] [address]   Magnesium     Status: None   Collection Time: 06/24/21  6:07 PM  Result Value Ref Range   Magnesium 2.3 1.7 - 2.4 mg/dL    Comment: Performed at [facility], [place] [address], [place], [place] [address]  Troponin I (High  Sensitivity)     Status: None   Collection Time: 06/24/21 10:25 PM  Result Value Ref Range   Troponin I (High Sensitivity) 8 <18 ng/L    Comment: (NOTE) Elevated high sensitivity troponin I (hsTnI) values and significant  changes across serial measurements may suggest ACS but many other  chronic and acute conditions are known to elevate hsTnI results.  Refer to the "Links" section for chest pain algorithms and additional  guidance. Performed at [facility] [place] [address] [place], [place] [address]   TSH     Status: Abnormal   Collection Time: 06/24/21 10:25 PM  Result Value Ref Range   TSH 9.202 (H) 0.350 - 4.500 uIU/mL    Comment: Performed by a 3rd Generation assay with a functional sensitivity of <=0.01 uIU/mL. Performed at [facility], [place] [address] [place], [place] [address]   T4, free     Status: None   Collection Time: 06/24/21 10:25 PM  Result Value Ref Range   Free T4 0.70 0.61 - 1.12 ng/dL    Comment: (NOTE) Biotin ingestion may interfere with free T4 tests. If the results are inconsistent with the TSH level, previous test results, or the clinical presentation, then consider biotin interference. If needed, order repeat testing after stopping biotin. Performed at [facility], [place] [address] [place], [place] [address]   Vitamin B12     Status: Abnormal   Collection Time: 06/24/21 10:25 PM  Result Value Ref Range   Vitamin B-12 4,794 (H) 180 - 914 pg/mL    Comment: RESULTS CONFIRMED BY MANUAL DILUTION (NOTE) This assay is not validated for testing neonatal or myeloproliferative syndrome specimens for Vitamin B12 levels. Performed at [facility], [place] [address] [place], [place] [address]   Folate     Status: None   Collection Time: 06/24/21 10:25 PM  Result Value Ref Range   Folate 39.8 >5.9 ng/mL    Comment: RESULTS CONFIRMED BY MANUAL DILUTION Performed at [place] [address], [place], [place] [address]   Iron and TIBC     Status: None   Collection Time: 06/24/21 10:25 PM  Result Value Ref Range   Iron 57 28 - 170 ug/dL   TIBC NOT CALCULATED 250 - 450 ug/dL   Saturation Ratios NOT  CALCULATED 10.4 - 31.8 %   UIBC NOT CALCULATED ug/dL    Comment: Performed at [facility], [place] [address] [place], [place] [address]  Ferritin     Status: Abnormal   Collection Time: 06/24/21 10:25 PM  Result Value Ref Range   Ferritin 408 (H) 11 - 307 ng/mL    Comment: Performed at [facility], [place] [address] [place] [address] [place], [place] [address]  Reticulocytes     Status: Abnormal   Collection Time: 06/25/21  3:26 AM  Result Value Ref Range   Retic Ct Pct 1.3 0.4 - 3.1 %   RBC. 2.31 (L) 3.87 - 5.11 MIL/uL   Retic Count, Absolute 30.0 19.0 - 186.0 K/uL   Immature Retic Fract 15.2 2.3 - 15.9 %    Comment: Performed at [facility], [place] [address] [place], [place] [id number]  Basic metabolic panel     Status: Abnormal   Collection Time: 06/25/21  3:31 AM  Result Value Ref Range   Sodium 136 135 - 145 mmol/L   Potassium 4.0 3.5 - 5.1 mmol/L   Chloride 103 98 - 111 mmol/L   CO2 22 22 - 32 mmol/L   Glucose, Bld 100 (H) 70 - 99 mg/dL    Comment: Glucose reference range applies only to samples taken after fasting for at least 8 hours.   BUN 67 (H) 8 - 23 mg/dL   Creatinine, Ser 2.43 (H) 0.44 - 1.00 mg/dL   Calcium 7.8 (L) 8.9 - 10.3 mg/dL   GFR, Estimated 20 (L) >60 mL/min    Comment: (NOTE) Calculated using the CKD-EPI Creatinine Equation (2021)    Anion gap 11 5 - 15    Comment: Performed at [facility], [place] [address] [place], [place] [address]  CBC     Status: Abnormal   Collection Time: 06/25/21  3:31 AM  Result Value Ref Range   WBC 9.9 4.0 - 10.5 K/uL   RBC 2.32 (L) 3.87 - 5.11 MIL/uL   Hemoglobin 7.1 (L) 12.0 - 15.0 g/dL   HCT 22.9 (L) 36.0 - 46.0 %   MCV 98.7 80.0 - 100.0 fL    MCH 30.6 26.0 - 34.0 pg   MCHC 31.0 30.0 - 36.0 g/dL   RDW 22.8 (H) 11.5 - 15.5 %   Platelets 45 (L) 150 - 400 K/uL    Comment: Immature Platelet Fraction may be clinically indicated, consider ordering this additional test [id number] CONSISTENT WITH PREVIOUS RESULT    nRBC 0.3 (H) 0.0 - 0.2 %    Comment: Performed at [facility], [place] [address] [place], [place] [address]  Type and screen [place]     Status: None   Collection Time: 06/25/21  9:04 AM  Result Value Ref Range   ABO/RH(D) A POS    Antibody Screen NEG    Sample Expiration 06/28/2021,2359    Unit Number [id number]    Blood Component Type RED CELLS,LR    Unit division 00    Status of Unit ISSUED,FINAL    Transfusion Status OK TO TRANSFUSE    Crossmatch Result Compatible    Unit Number [id number]    Blood Component Type RED CELLS,LR    Unit division 00    Status of Unit ISSUED,FINAL    Transfusion Status OK TO TRANSFUSE    Crossmatch Result      Compatible Performed at [facility], [place] [address], [place], [place] [address]  Prepare RBC (crossmatch)     Status: None   Collection Time: 06/25/21  9:04 AM  Result Value Ref Range   Order Confirmation      ORDER PROCESSED BY BLOOD BANK Performed at [facility], [place] [address] [place], [place] [address]   BPAM RBC     Status: None   Collection Time: 06/25/21  9:04 AM  Result Value Ref Range   ISSUE DATE / TIME [id number]    Blood Product Unit Number [id number]    PRODUCT CODE [id number]    Unit Type and Rh 6200    Blood Product Expiration Date [id number]    ISSUE DATE / TIME [id number]    Blood Product Unit Number [id number]    PRODUCT CODE [id number]    Unit Type and Rh 6200    Blood Product Expiration Date [id number]   Urine Culture     Status: None   Collection Time: 06/25/21  9:51 AM   Specimen: Urine, Catheterized  Result Value Ref Range   Specimen  Description      URINE, CATHETERIZED Performed at [facility], [place] [address] [place], [place] [address]    Special Requests      NONE Performed at [facility], [place] [address] [place], [place] [address]    Culture      NO GROWTH Performed at [facility], [place] [address] [place], [place] [address]    Report Status 06/26/2021 FINAL   Hemoglobin and hematocrit, blood     Status: Abnormal   Collection Time: 06/25/21  7:29 PM  Result Value Ref Range   Hemoglobin 8.4 (L) 12.0 - 15.0 g/dL   HCT 25.9 (L) 36.0 - 46.0 %    Comment: Performed at [facility], [place] [address] [place], [place] [address]  CBC     Status: Abnormal   Collection Time: 06/26/21  3:25 AM  Result Value Ref Range   WBC 6.6 4.0 - 10.5 K/uL   RBC 2.25 (L) 3.87 - 5.11 MIL/uL   Hemoglobin 6.9 (LL) 12.0 - 15.0 g/dL    Comment: REPEATED TO VERIFY THIS CRITICAL RESULT HAS VERIFIED AND BEEN CALLED TO RN T KNAPP BY ALEXIS Bruce ON 12 14 2022 AT 0414, AND HAS BEEN READ BACK.     HCT 21.2 (L) 36.0 - 46.0 %   MCV 94.2 80.0 - 100.0 fL   MCH 30.7 26.0 - 34.0 pg   MCHC 32.5 30.0 - 36.0 g/dL   RDW 21.6 (H) 11.5 - 15.5 %   Platelets 35 (L) 150 - 400 K/uL    Comment: SPECIMEN CHECKED FOR CLOTS Immature Platelet Fraction may be clinically indicated, consider ordering this additional test [id number] CONSISTENT WITH PREVIOUS RESULT REPEATED TO VERIFY    nRBC 0.3 (H) 0.0 - 0.2 %    Comment: Performed at [facility], [place] [address] [place], [place] [id number]  Basic metabolic panel     Status: Abnormal   Collection Time: 06/26/21  3:25 AM  Result Value Ref Range   Sodium 135 135 - 145 mmol/L   Potassium 3.8 3.5 - 5.1 mmol/L   Chloride 105 98 - 111 mmol/L   CO2 22 22 - 32 mmol/L   Glucose, Bld 82 70 - 99 mg/dL    Comment: Glucose reference range applies only to samples taken after fasting for at least 8 hours.   BUN 66 (H) 8 - 23  mg/dL   Creatinine, Ser  2.29 (H) 0.44 - 1.00 mg/dL   Calcium 7.7 (L) 8.9 - 10.3 mg/dL   GFR, Estimated 21 (L) >60 mL/min    Comment: (NOTE) Calculated using the CKD-EPI Creatinine Equation (2021)    Anion gap 8 5 - 15    Comment: Performed at [facility], [place] [address], [place], [place] [address]  Magnesium     Status: None   Collection Time: 06/26/21  3:25 AM  Result Value Ref Range   Magnesium 2.0 1.7 - 2.4 mg/dL    Comment: Performed at [facility], [place] [address] [place], [place] [address]  Prepare RBC (crossmatch)     Status: None   Collection Time: 06/26/21  4:27 AM  Result Value Ref Range   Order Confirmation      ORDER PROCESSED BY BLOOD BANK Performed at [facility], [place] [address], [place], [place] [address]   Hemoglobin and hematocrit, blood     Status: Abnormal   Collection Time: 06/26/21 10:17 AM  Result Value Ref Range   Hemoglobin 9.4 (L) 12.0 - 15.0 g/dL    Comment: REPEATED TO VERIFY POST TRANSFUSION SPECIMEN DELTA CHECK NOTED    HCT 28.7 (L) 36.0 - 46.0 %    Comment: Performed at [facility], [place] [address], [place], [place] [address]  VITAMIN D 25 Hydroxy (Vit-D Deficiency, Fractures)     Status: None   Collection Time: 06/26/21 12:22 PM  Result Value Ref Range   Vit D, 25-Hydroxy 99.56 30 - 100 ng/mL    Comment: (NOTE) Vitamin D deficiency has been defined by the [place] practice guideline as a level of serum 25-OH  vitamin D less than 20 ng/mL (1,2). The Endocrine Society went on to  further define vitamin D insufficiency as a level between 21 and 29  ng/mL (2).  1. IOM (Institute of Medicine). 2010. Dietary reference intakes for  calcium and D. [place]: The [employer]. 2. Holick MF, Binkley Anderson, Bischoff-Ferrari HA, et al. Evaluation,  treatment, and prevention of vitamin D deficiency: an Endocrine  Society  clinical practice guideline, JCEM. 2011 Jul; 96(7): 1911-30.  Performed at [facility], [place] [address], [place], [place] [address]   Lipase, blood     Status: None   Collection Time: 06/26/21 12:22 PM  Result Value Ref Range   Lipase 27 11 - 51 U/L    Comment: Performed at [facility], [place] [address] [place], [place] [address]  CBC     Status: Abnormal   Collection Time: 06/27/21  3:55 AM  Result Value Ref Range   WBC 6.8 4.0 - 10.5 K/uL   RBC 3.00 (L) 3.87 - 5.11 MIL/uL   Hemoglobin 9.0 (L) 12.0 - 15.0 g/dL   HCT 27.5 (L) 36.0 - 46.0 %   MCV 91.7 80.0 - 100.0 fL   MCH 30.0 26.0 - 34.0 pg   MCHC 32.7 30.0 - 36.0 g/dL   RDW 21.6 (H) 11.5 - 15.5 %   Platelets 31 (L) 150 - 400 K/uL    Comment: SPECIMEN CHECKED FOR CLOTS Immature Platelet Fraction may be clinically indicated, consider ordering this additional test [id number] CONSISTENT WITH PREVIOUS RESULT REPEATED TO VERIFY    nRBC 0.4 (H) 0.0 - 0.2 %    Comment: Performed at [facility], [place] [address] [place], [place] [address]  Comprehensive metabolic panel     Status:  Abnormal   Collection Time: 06/27/21  3:55 AM  Result Value Ref Range   Sodium 136 135 - 145 mmol/L   Potassium 4.5 3.5 - 5.1 mmol/L   Chloride 105 98 - 111 mmol/L   CO2 21 (L) 22 - 32 mmol/L   Glucose, Bld 73 70 - 99 mg/dL    Comment: Glucose reference range applies only to samples taken after fasting for at least 8 hours.   BUN 63 (H) 8 - 23 mg/dL   Creatinine, Ser 2.08 (H) 0.44 - 1.00 mg/dL   Calcium 7.9 (L) 8.9 - 10.3 mg/dL   Total Protein 4.8 (L) 6.5 - 8.1 g/dL   Albumin 2.2 (L) 3.5 - 5.0 g/dL   AST 13 (L) 15 - 41 U/L   ALT 14 0 - 44 U/L   Alkaline Phosphatase 183 (H) 38 - 126 U/L   Total Bilirubin 1.1 0.3 - 1.2 mg/dL   GFR, Estimated 24 (L) >60 mL/min    Comment: (NOTE) Calculated using the CKD-EPI Creatinine Equation (2021)    Anion gap 10 5 - 15    Comment: Performed at [facility], [place] [address], [place], [place] [address]  Procalcitonin - Baseline     Status: None   Collection Time: 06/27/21  3:55 AM  Result Value Ref Range   Procalcitonin 0.22 ng/mL    Comment:        Interpretation: PCT (Procalcitonin) <= 0.5 ng/mL: Systemic infection (sepsis) is not likely. Local bacterial infection is possible. (NOTE)       Sepsis PCT Algorithm           Lower Respiratory Tract                                      Infection PCT Algorithm    ----------------------------     ----------------------------         PCT < 0.25 ng/mL                PCT < 0.10 ng/mL          Strongly encourage             Strongly discourage   discontinuation of antibiotics    initiation of antibiotics    ----------------------------     -----------------------------       PCT 0.25 - 0.50 ng/mL            PCT 0.10 - 0.25 ng/mL               OR       >80% decrease in PCT            Discourage initiation of                                            antibiotics      Encourage discontinuation           of antibiotics    ----------------------------     -----------------------------         PCT >= 0.50 ng/mL              PCT 0.26 - 0.50 ng/mL               AND        <80%  decrease in PCT             Encourage initiation of                                             antibiotics       Encourage continuation           of antibiotics    ----------------------------     -----------------------------        PCT >= 0.50 ng/mL                  PCT > 0.50 ng/mL               AND         increase in PCT                  Strongly encourage                                      initiation of antibiotics    Strongly encourage escalation           of antibiotics                                     -----------------------------                                           PCT <= 0.25 ng/mL                                                 OR                                        > 80% decrease in  PCT                                      Discontinue / Do not initiate                                             antibiotics  Performed at [facility], [address], [place], [place] [address]   Basic metabolic panel     Status: Abnormal   Collection Time: 06/28/21  3:52 AM  Result Value Ref Range   Sodium 138 135 - 145 mmol/L   Potassium 3.6 3.5 - 5.1 mmol/L    Comment: DELTA CHECK NOTED   Chloride 108 98 - 111 mmol/L   CO2 20 (L) 22 - 32 mmol/L   Glucose, Bld 94 70 - 99 mg/dL    Comment: Glucose reference range applies only to samples taken after fasting for at least 8 hours.   BUN  56 (H) 8 - 23 mg/dL   Creatinine, Ser 1.79 (H) 0.44 - 1.00 mg/dL   Calcium 8.0 (L) 8.9 - 10.3 mg/dL   GFR, Estimated 29 (L) >60 mL/min    Comment: (NOTE) Calculated using the CKD-EPI Creatinine Equation (2021)    Anion gap 10 5 - 15    Comment: Performed at [facility], [place] [address] [place], [place] [address]  Magnesium     Status: None   Collection Time: 06/28/21  3:52 AM  Result Value Ref Range   Magnesium 2.1 1.7 - 2.4 mg/dL    Comment: Performed at [facility], [place] [address] [place], [place] [address]  CBC with Differential/Platelet     Status: Abnormal   Collection Time: 06/28/21  3:52 AM  Result Value Ref Range   WBC 4.4 4.0 - 10.5 K/uL   RBC 2.83 (L) 3.87 - 5.11 MIL/uL   Hemoglobin 8.5 (L) 12.0 - 15.0 g/dL   HCT 27.0 (L) 36.0 - 46.0 %   MCV 95.4 80.0 - 100.0 fL   MCH 30.0 26.0 - 34.0 pg   MCHC 31.5 30.0 - 36.0 g/dL   RDW 21.3 (H) 11.5 - 15.5 %   Platelets 31 (L) 150 - 400 K/uL    Comment: SPECIMEN CHECKED FOR CLOTS Immature Platelet Fraction may be clinically indicated, consider ordering this additional test [id number] CONSISTENT WITH PREVIOUS RESULT REPEATED TO VERIFY    nRBC 0.7 (H) 0.0 - 0.2 %   Neutrophils Relative % 38 %   Neutro Abs 1.6 (L) 1.7 - 7.7 K/uL   Lymphocytes Relative 44 %   Lymphs Abs 1.9 0.7  - 4.0 K/uL   Monocytes Relative 14 %   Monocytes Absolute 0.6 0.1 - 1.0 K/uL   Eosinophils Relative 3 %   Eosinophils Absolute 0.2 0.0 - 0.5 K/uL   Basophils Relative 0 %   Basophils Absolute 0.0 0.0 - 0.1 K/uL   Immature Granulocytes 1 %   Abs Immature Granulocytes 0.03 0.00 - 0.07 K/uL   Pappenheimer Bodies PRESENT    Target Cells PRESENT     Comment: Performed at [facility], [place] [address] [place], [place] [address]  Resp Panel by RT-PCR (Flu A&B, Covid) Nasopharyngeal Swab     Status: Abnormal   Collection Time: 06/28/21 12:30 PM   Specimen: Nasopharyngeal Swab; Nasopharyngeal(NP) swabs in vial transport medium  Result Value Ref Range   SARS Coronavirus 2 by RT PCR POSITIVE (A) NEGATIVE    Comment: (NOTE) SARS-CoV-2 target nucleic acids are DETECTED.  The SARS-CoV-2 RNA is generally detectable in upper respiratory specimens during the acute phase of infection. Positive results are indicative of the presence of the identified virus, but do not rule out bacterial infection or co-infection with other pathogens not detected by the test. Clinical correlation with patient history and other diagnostic information is necessary to determine patient infection status. The expected result is Negative.  Fact Sheet for Patients: EntrepreneurPulse.com.au  Fact Sheet for Healthcare Providers: IncredibleEmployment.be  This test is not yet approved or cleared by the [place] FDA and  has been authorized for detection and/or diagnosis of SARS-CoV-2 by FDA under an Emergency Use Authorization (EUA).  This EUA will remain in effect (meaning this test can be used) for the duration of  the COVID-19 declaration under Section 564(b)(1) of the A ct, 21 U.S.C. section 360bbb-3(b)(1), unless the authorization is terminated or revoked sooner.     Influenza A by PCR NEGATIVE  NEGATIVE   Influenza B by PCR NEGATIVE NEGATIVE    Comment:  (NOTE) The Xpert Xpress SARS-CoV-2/FLU/RSV plus assay is intended as an aid in the diagnosis of influenza from Nasopharyngeal swab specimens and should not be used as a sole basis for treatment. Nasal washings and aspirates are unacceptable for Xpert Xpress SARS-CoV-2/FLU/RSV testing.  Fact Sheet for Patients: EntrepreneurPulse.com.au  Fact Sheet for Healthcare Providers: IncredibleEmployment.be  This test is not yet approved or cleared by the [place] FDA and has been authorized for detection and/or diagnosis of SARS-CoV-2 by FDA under an Emergency Use Authorization (EUA). This EUA will remain in effect (meaning this test can be used) for the duration of the COVID-19 declaration under Section 564(b)(1) of the Act, 21 U.S.C. section 360bbb-3(b)(1), unless the authorization is terminated or revoked.  Performed at [facility], [place] [address], [place], [place] [id number]   Basic metabolic panel     Status: Abnormal   Collection Time: 06/29/21  4:01 AM  Result Value Ref Range   Sodium 136 135 - 145 mmol/L   Potassium 3.4 (L) 3.5 - 5.1 mmol/L   Chloride 108 98 - 111 mmol/L   CO2 21 (L) 22 - 32 mmol/L   Glucose, Bld 87 70 - 99 mg/dL    Comment: Glucose reference range applies only to samples taken after fasting for at least 8 hours.   BUN 57 (H) 8 - 23 mg/dL   Creatinine, Ser 1.42 (H) 0.44 - 1.00 mg/dL   Calcium 7.7 (L) 8.9 - 10.3 mg/dL   GFR, Estimated 38 (L) >60 mL/min    Comment: (NOTE) Calculated using the CKD-EPI Creatinine Equation (2021)    Anion gap 7 5 - 15    Comment: Performed at [facility], [place] [address] [place], [place] [address]  Magnesium     Status: None   Collection Time: 06/29/21  4:01 AM  Result Value Ref Range   Magnesium 2.0 1.7 - 2.4 mg/dL    Comment: Performed at [facility], [place] [address] [place], [place] [address]  CBC with Differential/Platelet      Status: Abnormal   Collection Time: 06/29/21  4:01 AM  Result Value Ref Range   WBC 4.9 4.0 - 10.5 K/uL   RBC 2.68 (L) 3.87 - 5.11 MIL/uL   Hemoglobin 8.0 (L) 12.0 - 15.0 g/dL   HCT 24.5 (L) 36.0 - 46.0 %   MCV 91.4 80.0 - 100.0 fL   MCH 29.9 26.0 - 34.0 pg   MCHC 32.7 30.0 - 36.0 g/dL   RDW 20.9 (H) 11.5 - 15.5 %   Platelets 30 (L) 150 - 400 K/uL    Comment: Immature Platelet Fraction may be clinically indicated, consider ordering this additional test [id number]    nRBC 0.6 (H) 0.0 - 0.2 %   Neutrophils Relative % 30 %   Neutro Abs 1.5 (L) 1.7 - 7.7 K/uL   Lymphocytes Relative 51 %   Lymphs Abs 2.5 0.7 - 4.0 K/uL   Monocytes Relative 16 %   Monocytes Absolute 0.8 0.1 - 1.0 K/uL   Eosinophils Relative 3 %   Eosinophils Absolute 0.1 0.0 - 0.5 K/uL   Basophils Relative 0 %   Basophils Absolute 0.0 0.0 - 0.1 K/uL   Immature Granulocytes 0 %   Abs Immature Granulocytes 0.02 0.00 - 0.07 K/uL    Comment: Performed at [facility], [place] [address] [place], [place] [address]  Type and screen  [place]     Status: None   Collection Time: 06/29/21  9:07 AM  Result Value Ref Range   ABO/RH(D) A POS    Antibody Screen NEG    Sample Expiration      07/02/2021,2359 Performed at [facility], [place] [address], [place], [place] [address]   CBC with Differential/Platelet     Status: Abnormal   Collection Time: 06/29/21  9:07 AM  Result Value Ref Range   WBC 5.2 4.0 - 10.5 K/uL   RBC 2.96 (L) 3.87 - 5.11 MIL/uL   Hemoglobin 8.9 (L) 12.0 - 15.0 g/dL   HCT 27.5 (L) 36.0 - 46.0 %   MCV 92.9 80.0 - 100.0 fL   MCH 30.1 26.0 - 34.0 pg   MCHC 32.4 30.0 - 36.0 g/dL   RDW 21.0 (H) 11.5 - 15.5 %   Platelets 32 (L) 150 - 400 K/uL    Comment: SPECIMEN CHECKED FOR CLOTS Immature Platelet Fraction may be clinically indicated, consider ordering this additional test [id number] CONSISTENT WITH PREVIOUS RESULT REPEATED TO VERIFY    nRBC 0.4  (H) 0.0 - 0.2 %   Neutrophils Relative % 30 %   Neutro Abs 1.6 (L) 1.7 - 7.7 K/uL   Lymphocytes Relative 52 %   Lymphs Abs 2.7 0.7 - 4.0 K/uL   Monocytes Relative 15 %   Monocytes Absolute 0.8 0.1 - 1.0 K/uL   Eosinophils Relative 3 %   Eosinophils Absolute 0.2 0.0 - 0.5 K/uL   Basophils Relative 0 %   Basophils Absolute 0.0 0.0 - 0.1 K/uL   Immature Granulocytes 0 %   Abs Immature Granulocytes 0.02 0.00 - 0.07 K/uL    Comment: Performed at [facility], [place] [address] [place], [place] [address]  Technologist smear review     Status: None   Collection Time: 06/29/21  9:07 AM  Result Value Ref Range   WBC MORPHOLOGY MORPHOLOGY UNREMARKABLE    RBC MORPHOLOGY TARGET CELLS     Comment: TEARDROP CELLS Acanthocytes present    Tech Review Normal platelet morphology     Comment: Performed at [facility], [place] [address] [place], [place] [address]  Protime-INR     Status: Abnormal   Collection Time: 06/29/21  9:07 AM  Result Value Ref Range   Prothrombin Time 18.1 (H) 11.4 - 15.2 seconds   INR 1.5 (H) 0.8 - 1.2    Comment: (NOTE) INR goal varies based on device and disease states. Performed at [facility], [place] [address], [place], [place] [address]   Lactate dehydrogenase     Status: None   Collection Time: 06/29/21  9:07 AM  Result Value Ref Range   LDH 118 98 - 192 U/L    Comment: Performed at [facility], [place] [address], [place], [place] [address]  Fibrinogen     Status: None   Collection Time: 06/29/21  9:07 AM  Result Value Ref Range   Fibrinogen 231 210 - 475 mg/dL    Comment: (NOTE) Fibrinogen results may be underestimated in patients receiving thrombolytic therapy. Performed at [facility], [place] [address] [place], [place] [address]   Reticulocytes     Status: Abnormal   Collection Time: 06/29/21  9:07 AM  Result Value Ref Range   Retic Ct Pct 1.2 0.4 - 3.1 %   RBC.  2.95 (L) 3.87 - 5.11 MIL/uL   Retic Count, Absolute 36.6 19.0 -  186.0 K/uL   Immature Retic Fract 31.5 (H) 2.3 - 15.9 %    Comment: Performed at [facility], [place] [address], [place], [place] [address]  C-reactive protein     Status: Abnormal   Collection Time: 06/29/21  9:07 AM  Result Value Ref Range   CRP 8.3 (H) <1.0 mg/dL    Comment: Performed at [place] [address], [place], [place] [id number]  Basic metabolic panel     Status: Abnormal   Collection Time: 06/30/21  3:47 AM  Result Value Ref Range   Sodium 139 135 - 145 mmol/L   Potassium 3.5 3.5 - 5.1 mmol/L   Chloride 110 98 - 111 mmol/L   CO2 22 22 - 32 mmol/L   Glucose, Bld 87 70 - 99 mg/dL    Comment: Glucose reference range applies only to samples taken after fasting for at least 8 hours.   BUN 52 (H) 8 - 23 mg/dL   Creatinine, Ser 1.31 (H) 0.44 - 1.00 mg/dL   Calcium 7.9 (L) 8.9 - 10.3 mg/dL   GFR, Estimated 42 (L) >60 mL/min    Comment: (NOTE) Calculated using the CKD-EPI Creatinine Equation (2021)    Anion gap 7 5 - 15    Comment: Performed at [facility], [place] [address] [place], [place] [address]  Magnesium     Status: None   Collection Time: 06/30/21  3:47 AM  Result Value Ref Range   Magnesium 2.0 1.7 - 2.4 mg/dL    Comment: Performed at [facility], [place] [address], [place], [place] [address]  CBC with Differential/Platelet     Status: Abnormal   Collection Time: 06/30/21  3:47 AM  Result Value Ref Range   WBC 5.1 4.0 - 10.5 K/uL   RBC 2.81 (L) 3.87 - 5.11 MIL/uL   Hemoglobin 8.3 (L) 12.0 - 15.0 g/dL   HCT 26.6 (L) 36.0 - 46.0 %   MCV 94.7 80.0 - 100.0 fL   MCH 29.5 26.0 - 34.0 pg   MCHC 31.2 30.0 - 36.0 g/dL   RDW 20.8 (H) 11.5 - 15.5 %   Platelets 30 (L) 150 - 400 K/uL    Comment: SPECIMEN CHECKED FOR CLOTS Immature Platelet Fraction may be clinically indicated, consider ordering this additional test [id number] CONSISTENT WITH  PREVIOUS RESULT REPEATED TO VERIFY    nRBC 0.4 (H) 0.0 - 0.2 %   Neutrophils Relative % 34 %   Neutro Abs 1.7 1.7 - 7.7 K/uL   Lymphocytes Relative 43 %   Lymphs Abs 2.2 0.7 - 4.0 K/uL   Monocytes Relative 18 %   Monocytes Absolute 0.9 0.1 - 1.0 K/uL   Eosinophils Relative 5 %   Eosinophils Absolute 0.3 0.0 - 0.5 K/uL   Basophils Relative 0 %   Basophils Absolute 0.0 0.0 - 0.1 K/uL   Immature Granulocytes 0 %   Abs Immature Granulocytes 0.02 0.00 - 0.07 K/uL    Comment: Performed at [facility], [place] [address] [place], [place] [address]  Lactic acid, plasma     Status: Abnormal   Collection Time: 07/10/21  3:50 AM  Result Value Ref Range   Lactic Acid, Venous 2.6 (HH) 0.5 - 1.9 mmol/L    Comment: CRITICAL RESULT CALLED TO, READ BACK BY AND VERIFIED WITH:  Lona Millard RN 07/10/21 @ [id number] VS Performed at [facility], [place] [address] [place], [place] [address]   Comprehensive metabolic  panel     Status: Abnormal   Collection Time: 07/10/21  3:50 AM  Result Value Ref Range   Sodium 140 135 - 145 mmol/L   Potassium 4.2 3.5 - 5.1 mmol/L   Chloride 109 98 - 111 mmol/L   CO2 21 (L) 22 - 32 mmol/L   Glucose, Bld 88 70 - 99 mg/dL    Comment: Glucose reference range applies only to samples taken after fasting for at least 8 hours.   BUN 110 (H) 8 - 23 mg/dL    Comment: RESULTS CONFIRMED BY MANUAL DILUTION   Creatinine, Ser 1.17 (H) 0.44 - 1.00 mg/dL   Calcium 8.6 (L) 8.9 - 10.3 mg/dL   Total Protein 5.5 (L) 6.5 - 8.1 g/dL   Albumin 2.2 (L) 3.5 - 5.0 g/dL   AST 16 15 - 41 U/L   ALT 19 0 - 44 U/L   Alkaline Phosphatase 319 (H) 38 - 126 U/L   Total Bilirubin 1.2 0.3 - 1.2 mg/dL   GFR, Estimated 48 (L) >60 mL/min    Comment: (NOTE) Calculated using the CKD-EPI Creatinine Equation (2021)    Anion gap 10 5 - 15    Comment: Performed at [facility], [place] [address] [place], [place] [address]  CBC WITH DIFFERENTIAL      Status: Abnormal   Collection Time: 07/10/21  3:50 AM  Result Value Ref Range   WBC 8.8 4.0 - 10.5 K/uL   RBC 3.34 (L) 3.87 - 5.11 MIL/uL   Hemoglobin 10.0 (L) 12.0 - 15.0 g/dL   HCT 32.0 (L) 36.0 - 46.0 %   MCV 95.8 80.0 - 100.0 fL   MCH 29.9 26.0 - 34.0 pg   MCHC 31.3 30.0 - 36.0 g/dL   RDW 22.5 (H) 11.5 - 15.5 %   Platelets PLATELET CLUMPS NOTED ON SMEAR, UNABLE TO ESTIMATE 150 - 400 K/uL    Comment: SPECIMEN CHECKED FOR CLOTS Immature Platelet Fraction may be clinically indicated, consider ordering this additional test [id number] REPEATED TO VERIFY    nRBC 0.2 0.0 - 0.2 %   Neutrophils Relative % 52 %   Neutro Abs 4.6 1.7 - 7.7 K/uL   Lymphocytes Relative 33 %   Lymphs Abs 2.9 0.7 - 4.0 K/uL   Monocytes Relative 11 %   Monocytes Absolute 1.0 0.1 - 1.0 K/uL   Eosinophils Relative 3 %   Eosinophils Absolute 0.3 0.0 - 0.5 K/uL   Basophils Relative 0 %   Basophils Absolute 0.0 0.0 - 0.1 K/uL   Immature Granulocytes 1 %   Abs Immature Granulocytes 0.05 0.00 - 0.07 K/uL   Burr Cells PRESENT    Polychromasia PRESENT     Comment: Performed at [facility], [place] [address], [place], [place] [address]  Protime-INR     Status: Abnormal   Collection Time: 07/10/21  3:50 AM  Result Value Ref Range   Prothrombin Time 23.7 (H) 11.4 - 15.2 seconds   INR 2.1 (H) 0.8 - 1.2    Comment: (NOTE) INR goal varies based on device and disease states. Performed at [facility], [place] [address] [place], [place] [address]   APTT     Status: Abnormal   Collection Time: 07/10/21  3:50 AM  Result Value Ref Range   aPTT 41 (H) 24 - 36 seconds    Comment:        IF BASELINE aPTT IS ELEVATED, SUGGEST PATIENT RISK ASSESSMENT BE USED TO DETERMINE APPROPRIATE  ANTICOAGULANT THERAPY. Performed at [facility], [place] [address] [place], [place] [address]   Blood Culture (routine x 2)     Status: Abnormal   Collection Time: 07/10/21  3:50 AM    Specimen: BLOOD  Result Value Ref Range   Specimen Description      BLOOD LEFT ANTECUBITAL Performed at [place] [address], [place], [place] [address]    Special Requests      BOTTLES DRAWN AEROBIC AND ANAEROBIC Blood Culture adequate volume Performed at [place] [address], [place], [place] [address]    Culture  Setup Time      GRAM POSITIVE COCCI IN CLUSTERS IN BOTH AEROBIC AND ANAEROBIC BOTTLES CRITICAL RESULT CALLED TO, READ BACK BY AND VERIFIED WITH: J. LEGGE 07/11/21 1154 FH    Culture (A)     STAPHYLOCOCCUS EPIDERMIDIS THE SIGNIFICANCE OF ISOLATING THIS ORGANISM FROM A SINGLE SET OF BLOOD CULTURES WHEN MULTIPLE SETS ARE DRAWN IS UNCERTAIN. PLEASE NOTIFY THE MICROBIOLOGY DEPARTMENT WITHIN ONE WEEK IF SPECIATION AND SENSITIVITIES ARE REQUIRED. Performed at [facility], [place] [address], [place], [place] [address]    Report Status 07/13/2021 FINAL   Blood Culture ID Panel (Reflexed)     Status: Abnormal   Collection Time: 07/10/21  3:50 AM  Result Value Ref Range   Enterococcus faecalis NOT DETECTED NOT DETECTED   Enterococcus Faecium NOT DETECTED NOT DETECTED   Listeria monocytogenes NOT DETECTED NOT DETECTED   Staphylococcus species DETECTED (A) NOT DETECTED    Comment: CRITICAL RESULT CALLED TO, READ BACK BY AND VERIFIED WITH: J. LEGGE 07/11/21 1154 FH    Staphylococcus aureus (BCID) NOT DETECTED NOT DETECTED   Staphylococcus epidermidis DETECTED (A) NOT DETECTED    Comment: Methicillin (oxacillin) resistant coagulase negative staphylococcus. Possible blood culture contaminant (unless isolated from more than one blood culture draw or clinical case suggests pathogenicity). No antibiotic treatment is indicated for blood  culture contaminants. CRITICAL RESULT CALLED TO, READ BACK BY AND VERIFIED WITH: J. LEGGE 07/11/21 1154 FH    Staphylococcus lugdunensis NOT DETECTED NOT DETECTED   Streptococcus species NOT  DETECTED NOT DETECTED   Streptococcus agalactiae NOT DETECTED NOT DETECTED   Streptococcus pneumoniae NOT DETECTED NOT DETECTED   Streptococcus pyogenes NOT DETECTED NOT DETECTED   A.calcoaceticus-baumannii NOT DETECTED NOT DETECTED   Bacteroides fragilis NOT DETECTED NOT DETECTED   Enterobacterales NOT DETECTED NOT DETECTED   Enterobacter cloacae complex NOT DETECTED NOT DETECTED   Escherichia coli NOT DETECTED NOT DETECTED   Klebsiella aerogenes NOT DETECTED NOT DETECTED   Klebsiella oxytoca NOT DETECTED NOT DETECTED   Klebsiella pneumoniae NOT DETECTED NOT DETECTED   Proteus species NOT DETECTED NOT DETECTED   Salmonella species NOT DETECTED NOT DETECTED   Serratia marcescens NOT DETECTED NOT DETECTED   Haemophilus influenzae NOT DETECTED NOT DETECTED   Neisseria meningitidis NOT DETECTED NOT DETECTED   Pseudomonas aeruginosa NOT DETECTED NOT DETECTED   Stenotrophomonas maltophilia NOT DETECTED NOT DETECTED   Candida albicans NOT DETECTED NOT DETECTED   Candida auris NOT DETECTED NOT DETECTED   Candida glabrata NOT DETECTED NOT DETECTED   Candida krusei NOT DETECTED NOT DETECTED   Candida parapsilosis NOT DETECTED NOT DETECTED   Candida tropicalis NOT DETECTED NOT DETECTED   Cryptococcus neoformans/gattii NOT DETECTED NOT DETECTED   Methicillin resistance mecA/C DETECTED (A) NOT DETECTED    Comment: CRITICAL RESULT CALLED TO, READ BACK BY AND VERIFIED WITH: J. LEGGE 07/11/21 1154 [place] Performed  at [facility], [place] [address] [place], [place] [address]   Urinalysis, Routine w reflex microscopic Urine, Catheterized     Status: None   Collection Time: 07/10/21  9:15 AM  Result Value Ref Range   Color, Urine YELLOW YELLOW   APPearance CLEAR CLEAR   Specific Gravity, Urine 1.014 1.005 - 1.030   pH 5.0 5.0 - 8.0   Glucose, UA NEGATIVE NEGATIVE mg/dL   Hgb urine dipstick NEGATIVE NEGATIVE   Bilirubin Urine NEGATIVE NEGATIVE   Ketones, ur NEGATIVE NEGATIVE mg/dL   Protein,  ur NEGATIVE NEGATIVE mg/dL   Nitrite NEGATIVE NEGATIVE   Leukocytes,Ua NEGATIVE NEGATIVE    Comment: Performed at [facility], [place] [address], [place], [place] [address]  Urine Culture     Status: None   Collection Time: 07/10/21  9:15 AM   Specimen: In/Out Cath Urine  Result Value Ref Range   Specimen Description      IN/OUT CATH URINE Performed at [facility], [place] [address], [place], [place] [address]    Special Requests      NONE Performed at [facility], [place] [address], [place], [place] [address]    Culture      NO GROWTH Performed at [facility], [place] [address], [place], [place] [address]    Report Status 07/11/2021 FINAL   POC occult blood, ED Provider will collect     Status: Abnormal   Collection Time: 07/10/21 10:33 AM  Result Value Ref Range   Fecal Occult Bld POSITIVE (A) NEGATIVE  Lactate dehydrogenase     Status: Abnormal   Collection Time: 07/10/21 12:55 PM  Result Value Ref Range   LDH 240 (H) 98 - 192 U/L    Comment: Performed at [facility], [place] [address], [place], [place] [address]  Haptoglobin     Status: None   Collection Time: 07/10/21 12:55 PM  Result Value Ref Range   Haptoglobin 49 42 - 346 mg/dL    Comment: (NOTE) Performed At: [facility] [place], [place] [id number] Rush Farmer MD [id number]   CBC with Differential/Platelet     Status: Abnormal   Collection Time: 07/10/21 12:55 PM  Result Value Ref Range   WBC 7.4 4.0 - 10.5 K/uL   RBC 3.04 (L) 3.87 - 5.11 MIL/uL   Hemoglobin 9.1 (L) 12.0 - 15.0 g/dL   HCT 28.5 (L) 36.0 - 46.0 %   MCV 93.8 80.0 - 100.0 fL   MCH 29.9 26.0 - 34.0 pg   MCHC 31.9 30.0 - 36.0 g/dL   RDW 22.5 (H) 11.5 - 15.5 %   Platelets 108 (L) 150 - 400 K/uL    Comment: SPECIMEN CHECKED FOR CLOTS Immature Platelet Fraction may be clinically indicated, consider ordering this additional  test [id number] REPEATED TO VERIFY PLATELET COUNT CONFIRMED BY SMEAR    nRBC 0.3 (H) 0.0 - 0.2 %   Neutrophils Relative % 55 %   Neutro Abs 4.0 1.7 - 7.7 K/uL   Lymphocytes Relative 28 %   Lymphs Abs 2.1 0.7 - 4.0 K/uL   Monocytes Relative 15 %   Monocytes Absolute 1.1 (H) 0.1 - 1.0 K/uL   Eosinophils Relative 2 %   Eosinophils Absolute 0.2 0.0 - 0.5 K/uL   Basophils Relative 0 %   Basophils Absolute 0.0 0.0 - 0.1 K/uL   Immature Granulocytes 0 %   Abs Immature Granulocytes 0.03 0.00 - 0.07 K/uL  Acanthocytes PRESENT    Trudee Kuster Cells PRESENT    Pappenheimer Bodies PRESENT    Polychromasia PRESENT    Basophilic Stippling PRESENT    Target Cells PRESENT     Comment: Performed at [facility], [place] [address] [place], [place] [address]  Blood Culture (routine x 2)     Status: None (Preliminary result)   Collection Time: 07/10/21  7:09 PM   Specimen: BLOOD  Result Value Ref Range   Specimen Description      BLOOD SITE NOT SPECIFIED Performed at [facility] [address], [place], [place] [address]    Special Requests      BOTTLES DRAWN AEROBIC ONLY Blood Culture results may not be optimal due to an inadequate volume of blood received in culture bottles Performed at [place] [address], [place], [place] [address]    Culture      NO GROWTH 3 DAYS Performed at [facility], [place] [address] [place], [place] [address]    Report Status PENDING   Lactic acid, plasma     Status: Abnormal   Collection Time: 07/10/21  7:09 PM  Result Value Ref Range   Lactic Acid, Venous 2.3 (HH) 0.5 - 1.9 mmol/L    Comment: CRITICAL VALUE NOTED.  VALUE IS CONSISTENT WITH PREVIOUSLY REPORTED AND CALLED VALUE. Performed at [facility], [place] [address] [place],  [address]   Procalcitonin - Baseline     Status: None   Collection Time: 07/10/21  7:09 PM  Result Value Ref Range   Procalcitonin 2.13 ng/mL     Comment:        Interpretation: PCT > 2 ng/mL: Systemic infection (sepsis) is likely, unless other causes are known. (NOTE)       Sepsis PCT Algorithm           Lower Respiratory Tract                                      Infection PCT Algorithm    ----------------------------     ----------------------------         PCT < 0.25 ng/mL                PCT < 0.10 ng/mL          Strongly encourage             Strongly discourage   discontinuation of antibiotics    initiation of antibiotics    ----------------------------     -----------------------------       PCT 0.25 - 0.50 ng/mL            PCT 0.10 - 0.25 ng/mL               OR       >80% decrease in PCT            Discourage initiation of                                            antibiotics      Encourage discontinuation           of antibiotics    ----------------------------     -----------------------------         PCT >=  0.50 ng/mL              PCT 0.26 - 0.50 ng/mL               AND       <80% decrease in PCT              Encourage initiation of                                             antibiotics       Encourage continuation           of antibiotics    ----------------------------     -----------------------------        PCT >= 0.50 ng/mL                  PCT > 0.50 ng/mL               AND         increase in PCT                  Strongly encourage                                      initiation of antibiotics    Strongly encourage escalation           of antibiotics                                     -----------------------------                                           PCT <= 0.25 ng/mL                                                 OR                                        > 80% decrease in PCT                                      Discontinue / Do not initiate                                             antibiotics  Performed at [facility] [address], [place], [place]  [address]   Lactic acid, plasma     Status: Abnormal   Collection Time: 07/10/21  9:12 PM  Result Value Ref Range   Lactic Acid, Venous 2.6 (HH) 0.5 - 1.9 mmol/L    Comment: CRITICAL VALUE NOTED.  VALUE IS CONSISTENT WITH PREVIOUSLY REPORTED AND CALLED VALUE.  Performed at [facility], [place] [address], [place], [place] [address]   Hemoglobin and hematocrit, blood     Status: Abnormal   Collection Time: 07/10/21  9:12 PM  Result Value Ref Range   Hemoglobin 8.5 (L) 12.0 - 15.0 g/dL   HCT 26.1 (L) 36.0 - 46.0 %    Comment: Performed at [facility], [place] [address], [place], [place] [address]  Comprehensive metabolic panel     Status: Abnormal   Collection Time: 07/11/21  3:50 AM  Result Value Ref Range   Sodium 139 135 - 145 mmol/L   Potassium 3.8 3.5 - 5.1 mmol/L   Chloride 111 98 - 111 mmol/L   CO2 20 (L) 22 - 32 mmol/L   Glucose, Bld 75 70 - 99 mg/dL    Comment: Glucose reference range applies only to samples taken after fasting for at least 8 hours.   BUN 114 (H) 8 - 23 mg/dL    Comment: RESULTS CONFIRMED BY MANUAL DILUTION   Creatinine, Ser 1.28 (H) 0.44 - 1.00 mg/dL   Calcium 7.9 (L) 8.9 - 10.3 mg/dL   Total Protein 4.6 (L) 6.5 - 8.1 g/dL   Albumin 2.0 (L) 3.5 - 5.0 g/dL   AST 16 15 - 41 U/L   ALT 15 0 - 44 U/L   Alkaline Phosphatase 261 (H) 38 - 126 U/L   Total Bilirubin 1.1 0.3 - 1.2 mg/dL   GFR, Estimated 43 (L) >60 mL/min    Comment: (NOTE) Calculated using the CKD-EPI Creatinine Equation (2021)    Anion gap 8 5 - 15    Comment: Performed at [facility], [place] [address], [place], [place] [address]  CBC     Status: Abnormal   Collection Time: 07/11/21  3:50 AM  Result Value Ref Range   WBC 6.5 4.0 - 10.5 K/uL   RBC 2.37 (L) 3.87 - 5.11 MIL/uL   Hemoglobin 7.1 (L) 12.0 - 15.0 g/dL   HCT 22.3 (L) 36.0 - 46.0 %   MCV 94.1 80.0 - 100.0 fL   MCH 30.0 26.0 - 34.0 pg   MCHC 31.8 30.0 - 36.0 g/dL   RDW 22.8 (H) 11.5  - 15.5 %   Platelets 83 (L) 150 - 400 K/uL    Comment: SPECIMEN CHECKED FOR CLOTS Immature Platelet Fraction may be clinically indicated, consider ordering this additional test [id number] CONSISTENT WITH PREVIOUS RESULT REPEATED TO VERIFY    nRBC 0.0 0.0 - 0.2 %    Comment: Performed at [facility], [place] [address], [place], [place] [address]  Protime-INR     Status: Abnormal   Collection Time: 07/11/21  5:15 AM  Result Value Ref Range   Prothrombin Time 24.5 (H) 11.4 - 15.2 seconds   INR 2.2 (H) 0.8 - 1.2    Comment: (NOTE) INR goal varies based on device and disease states. Performed at [facility], [place] [address] [place], [place] [address]   APTT     Status: Abnormal   Collection Time: 07/11/21  5:15 AM  Result Value Ref Range   aPTT 45 (H) 24 - 36 seconds    Comment:        IF BASELINE aPTT IS ELEVATED, SUGGEST PATIENT RISK ASSESSMENT BE USED TO DETERMINE APPROPRIATE ANTICOAGULANT THERAPY. Performed at [facility], [place] [address] [place], [place] [address]   TSH     Status: Abnormal   Collection Time: 07/11/21  3:04  PM  Result Value Ref Range   TSH 8.603 (H) 0.350 - 4.500 uIU/mL    Comment: Performed by a 3rd Generation assay with a functional sensitivity of <=0.01 uIU/mL. Performed at [facility], [place] [address] [place], [place] [address]   T4, free     Status: None   Collection Time: 07/11/21  3:04 PM  Result Value Ref Range   Free T4 0.88 0.61 - 1.12 ng/dL    Comment: (NOTE) Biotin ingestion may interfere with free T4 tests. If the results are inconsistent with the TSH level, previous test results, or the clinical presentation, then consider biotin interference. If needed, order repeat testing after stopping biotin. Performed at [facility], [place] [address] [place], [place] [address]   APTT     Status: Abnormal   Collection Time: 07/11/21  6:23 PM  Result Value Ref Range    aPTT 44 (H) 24 - 36 seconds    Comment:        IF BASELINE aPTT IS ELEVATED, SUGGEST PATIENT RISK ASSESSMENT BE USED TO DETERMINE APPROPRIATE ANTICOAGULANT THERAPY. Performed at [facility], [place] [address], [place], [place] [id number]   Basic metabolic panel     Status: Abnormal   Collection Time: 07/12/21  4:46 AM  Result Value Ref Range   Sodium 139 135 - 145 mmol/L   Potassium 3.8 3.5 - 5.1 mmol/L   Chloride 112 (H) 98 - 111 mmol/L   CO2 20 (L) 22 - 32 mmol/L   Glucose, Bld 85 70 - 99 mg/dL    Comment: Glucose reference range applies only to samples taken after fasting for at least 8 hours.   BUN 97 (H) 8 - 23 mg/dL    Comment: RESULTS CONFIRMED BY MANUAL DILUTION   Creatinine, Ser 1.36 (H) 0.44 - 1.00 mg/dL   Calcium 7.9 (L) 8.9 - 10.3 mg/dL   GFR, Estimated 40 (L) >60 mL/min    Comment: (NOTE) Calculated using the CKD-EPI Creatinine Equation (2021)    Anion gap 7 5 - 15    Comment: Performed at [facility], [place] [address], [place], [place] [address]  Protime-INR     Status: Abnormal   Collection Time: 07/12/21  4:46 AM  Result Value Ref Range   Prothrombin Time 24.5 (H) 11.4 - 15.2 seconds   INR 2.2 (H) 0.8 - 1.2    Comment: (NOTE) INR goal varies based on device and disease states. Performed at [facility], [place] [address], [place], [place] [address]   CBC     Status: Abnormal   Collection Time: 07/12/21  6:22 AM  Result Value Ref Range   WBC 5.6 4.0 - 10.5 K/uL   RBC 2.49 (L) 3.87 - 5.11 MIL/uL   Hemoglobin 7.6 (L) 12.0 - 15.0 g/dL   HCT 23.6 (L) 36.0 - 46.0 %   MCV 94.8 80.0 - 100.0 fL   MCH 30.5 26.0 - 34.0 pg   MCHC 32.2 30.0 - 36.0 g/dL   RDW 22.8 (H) 11.5 - 15.5 %   Platelets 95 (L) 150 - 400 K/uL    Comment: SPECIMEN CHECKED FOR CLOTS Immature Platelet Fraction may be clinically indicated, consider ordering this additional test [id number]    nRBC 0.4 (H) 0.0 - 0.2 %    Comment: Performed at  [facility], [place] [address], [place], [place] [address]  ECHOCARDIOGRAM COMPLETE     Status: None   Collection Time: 07/12/21  2:23 PM  Result Value Ref Range   Weight 4,784.02 oz   Height 68 in   BP 100/46 mmHg   Single Plane A2C EF 58.5 %   Single Plane A4C EF 62.1 %   Calc EF 61.0 %   S' Lateral 2.80 cm   AR max vel 2.79 cm2   AV Peak grad 7.2 mmHg   Ao pk vel 1.34 m/s   Area-P 1/2 5.84 cm2  Vitamin B12     Status: Abnormal   Collection Time: 07/12/21  2:53 PM  Result Value Ref Range   Vitamin B-12 4,634 (H) 180 - 914 pg/mL    Comment: RESULTS CONFIRMED BY MANUAL DILUTION (NOTE) This assay is not validated for testing neonatal or myeloproliferative syndrome specimens for Vitamin B12 levels. Performed at [facility], [place] [address] [place], [place] [address]   Folate     Status: None   Collection Time: 07/12/21  2:53 PM  Result Value Ref Range   Folate 48.7 >5.9 ng/mL    Comment: RESULTS CONFIRMED BY MANUAL DILUTION Performed at [place] [address], [place], [place] [address]   Iron and TIBC     Status: None (Preliminary result)   Collection Time: 07/12/21  2:53 PM  Result Value Ref Range   Iron 44 28 - 170 ug/dL    Comment: Performed at [facility], [place] [address], [place], [place] [address]   TIBC PENDING 250 - 450 ug/dL   Saturation Ratios PENDING 10.4 - 31.8 %   UIBC PENDING ug/dL  Ferritin     Status: Abnormal   Collection Time: 07/12/21  2:53 PM  Result Value Ref Range   Ferritin 594 (H) 11 - 307 ng/mL    Comment: Performed at [facility], [place] [address] [place], [place] [address]  Reticulocytes     Status: Abnormal   Collection Time: 07/12/21  2:53 PM  Result Value Ref Range   Retic Ct Pct 3.4 (H) 0.4 - 3.1 %   RBC. 2.66 (L) 3.87 - 5.11 MIL/uL   Retic Count, Absolute 89.4 19.0 - 186.0 K/uL   Immature Retic Fract 24.2 (H) 2.3 - 15.9 %    Comment:  Performed at [facility], [place] [address] [place], [place] [address]  Protein / creatinine ratio, urine     Status: Abnormal   Collection Time: 07/12/21  3:05 PM  Result Value Ref Range   Creatinine, Urine 35.48 mg/dL   Total Protein, Urine 6 mg/dL    Comment: NO NORMAL RANGE ESTABLISHED FOR THIS TEST   Protein Creatinine Ratio 0.17 (H) 0.00 - 0.15 mg/mg[Cre]    Comment: Performed at [facility], [place] [address], [place], [place] [id number]  Basic metabolic panel     Status: Abnormal   Collection Time: 07/13/21  9:12 AM  Result Value Ref Range   Sodium 140 135 - 145 mmol/L   Potassium 5.4 (H) 3.5 - 5.1 mmol/L    Comment: NO VISIBLE HEMOLYSIS DELTA CHECK NOTED    Chloride 116 (H) 98 - 111 mmol/L   CO2 18 (L) 22 - 32 mmol/L   Glucose, Bld 95 70 - 99 mg/dL    Comment: Glucose reference range applies only to samples taken after fasting for at least 8 hours.   BUN 91 (H) 8 - 23 mg/dL   Creatinine, Ser 1.08 (H) 0.44 - 1.00 mg/dL   Calcium 7.7 (L) 8.9 - 10.3 mg/dL  GFR, Estimated 53 (L) >60 mL/min    Comment: (NOTE) Calculated using the CKD-EPI Creatinine Equation (2021)    Anion gap 6 5 - 15    Comment: Performed at [facility], [place] [address], [place], [place] [address]  Protime-INR     Status: Abnormal   Collection Time: 07/13/21  9:34 AM  Result Value Ref Range   Prothrombin Time 24.3 (H) 11.4 - 15.2 seconds   INR 2.2 (H) 0.8 - 1.2    Comment: (NOTE) INR goal varies based on device and disease states. Performed at [facility], [place] [address], [place], [place] [address]   APTT     Status: Abnormal   Collection Time: 07/13/21  9:34 AM  Result Value Ref Range   aPTT 43 (H) 24 - 36 seconds    Comment:        IF BASELINE aPTT IS ELEVATED, SUGGEST PATIENT RISK ASSESSMENT BE USED TO DETERMINE APPROPRIATE ANTICOAGULANT THERAPY. Performed at [facility], [place] [address]  [place], [place] [address]      Allergies: No Known Allergies  Current Medications:  Prior to Admission medications   Medication Sig Start Date End Date Taking? Authorizing Provider  ascorbic acid (VITAMIN C) 500 MG tablet Take 1 tablet (500 mg total) by mouth 2 (two) times daily. 06/28/21  Yes Lavina Hamman, MD  bisacodyl 5 MG EC tablet Take 5 mg by mouth daily as needed for moderate constipation.   Yes [provider]  Cholecalciferol (VITAMIN D3) 5000 UNITS TABS Take 1 tablet by mouth daily.   Yes [provider]  escitalopram (LEXAPRO) 5 MG tablet Take 5 mg by mouth at bedtime. 07/02/21  Yes [provider]  ferrous sulfate 325 (65 FE) MG tablet Take 325 mg by mouth daily with breakfast.   Yes [provider]  fludrocortisone (FLORINEF) 0.1 MG tablet Take 100 mcg by mouth daily. 07/08/21  Yes [provider]  folic acid (FOLVITE) 1 MG tablet Take 1 tablet (1 mg total) by mouth daily. 03/17/21  Yes Mercy Riding, MD  HYDROcodone-acetaminophen (NORCO/VICODIN) 5-325 MG tablet Take 1 tablet by mouth every 6 (six) hours as needed for moderate pain or severe pain. 06/28/21  Yes Lavina Hamman, MD  lactobacillus acidophilus & bulgar (LACTINEX) chewable tablet Chew 2 tablets by mouth 3 (three) times daily. 06/28/21  Yes Lavina Hamman, MD  levothyroxine (EUTHYROX) 125 MCG tablet Take 1 tablet (125 mcg total) by mouth daily before breakfast. 06/29/21  Yes Lavina Hamman, MD  nutrition supplement, JUVEN, (JUVEN) PACK Take 1 packet by mouth 2 (two) times daily between meals. Patient taking differently: Take 1 packet by mouth 2 (two) times daily. Midday and bedtime 06/28/21  Yes Lavina Hamman, MD  Nutritional Supplements (,FEEDING SUPPLEMENT, PROSOURCE PLUS) liquid Take 30 mLs by mouth 2 (two) times daily between meals. 06/28/21  Yes Lavina Hamman, MD  ondansetron (ZOFRAN) 4 MG tablet Take 4 mg by mouth every 8 (eight) hours as needed for nausea or  vomiting.   Yes [provider]  zinc sulfate 220 (50 Zn) MG capsule Take 1 capsule (220 mg total) by mouth daily. 06/29/21  Yes Lavina Hamman, MD  feeding supplement (ENSURE ENLIVE / ENSURE PLUS) LIQD Take 237 mLs by mouth 2 (two) times daily between meals. Patient not taking: Reported on 07/10/2021 06/28/21   Lavina Hamman, MD  mirtazapine (REMERON) 15 MG tablet Take 1 tablet (15 mg  total) by mouth at bedtime. Patient not taking: Reported on 07/10/2021 06/28/21   Lavina Hamman, MD  Nystatin (GERHARDT'S BUTT CREAM) CREA Apply 1 application topically 2 (two) times daily. Patient not taking: Reported on 07/10/2021 06/28/21   Lavina Hamman, MD    Social History:    reports that she has never smoked. She has never used smokeless tobacco. She reports that she does not drink alcohol and does not use drugs.   Family History:    Family History  Problem Relation Age of Onset   Hypertension Mother    Kidney disease Mother    Alzheimer's disease Father    Diabetes Brother    Hypertension Brother    Spina bifida Daughter     Psychiatric Specialty Exam: Physical Exam Constitutional:      Appearance: She is obese.  Neurological:     Mental Status: She is alert.  Psychiatric:        Mood and Affect: Mood normal.        Behavior: Behavior normal.        Thought Content: Thought content normal.    Review of Systems  Constitutional:  Positive for appetite change.  Gastrointestinal:  Positive for nausea.   Blood pressure (!) 122/57, pulse 76, temperature 98.4 F (36.9 C), temperature source Oral, resp. rate 18, height 5\' 8"  (1.727 m), weight 135.6 kg, last menstrual period 07/14/2001, SpO2 100 %.Body mass index is 45.46 kg/m.  General Appearance: Casual  Eye Contact:  Good  Speech:  Clear and Coherent and Normal Rate  Volume:  Decreased  Mood:  Anxious  Affect:  Congruent  Thought Process:  Goal Directed  Orientation:  Full (Time, Place, and Person)  Thought Content:   WDL  Suicidal Thoughts:  No  Homicidal Thoughts:  No  Memory:  Immediate;   Good Recent;   Fair Remote;   Fair  Judgement:  Fair  Insight:  Fair  Psychomotor Activity:  Decreased  Concentration:  Concentration: Fair and Attention Span: Fair  Recall:  [employer] of Knowledge:  Fair  Language:  Good  Akathisia:  No  Handed:  Right  AIMS (if indicated):     Assets:  Communication Skills Desire for Improvement Housing Social Support  ADL's:  Intact  Cognition:  WNL  Sleep:   ok         Assessment: Patient is 75 year old female [place] 75 year old with history of A. fib, hyper thyroidism, chronic thrombocytopenia, anxiety presented with weakness and lethargy and poor appetite.  As per history she has depression and anxiety.  Recommendations; She is on Lexapro 5 mg daily.  She is not sure who started this medication.  Recommend to switch from Lexapro to mirtazapine 15 mg to help her appetite depression and anxiety.  I explained to the patient that mirtazapine has a better effect on her appetite and she agreed to switch the medication.  Continue GI work-up to rule out other causes of lack of appetite.  Even though patient has poor appetite she do not recall significant weight loss. Patient does not meet criteria for inpatient psychiatric services. Psychiatry consultation services will continue to monitor the patient.   Berniece Andreas MD Psychiatry

## 2021-07-13 NOTE — TOC Progression Note (Signed)
Transition of Care [facility]) - Progression Note    Patient Details  Name: Sally Wise MRN: [id number] Date of Birth: 12/02/1945  [facility]) CM/SW Contact  Golda Acre, RN Phone Number: 07/13/2021, 12:01 PM  Clinical Narrative:    Per Alphonzo Lemmings at [facility] no bed open today [phone].  Patient family wanted [facility] or friend home will send out to friends home.   Expected Discharge Plan: Skilled Nursing Facility Barriers to Discharge: Continued Medical Work up  Expected Discharge Plan and Services Expected Discharge Plan: Skilled Nursing Facility   Discharge Planning Services: CM Consult Post Acute Care Choice: Skilled Nursing Facility Living arrangements for the past 2 months: Skilled Nursing Facility                                       Social Determinants of Health (SDOH) Interventions    Readmission Risk Interventions Readmission Risk Prevention Plan 07/12/2021 06/08/2020  Post Dischage Appt - Complete  Medication Screening - Complete  Transportation Screening Complete Complete  PCP or Specialist Appt within 3-5 Days Complete -  HRI or Home Care Consult Complete -  Social Work Consult for Recovery Care Planning/Counseling Complete -  Palliative Care Screening Not Applicable -  Medication Review Oceanographer) Complete -  Some recent data might be hidden

## 2021-07-13 NOTE — Progress Notes (Signed)
[name] 4:25 PM  Subjective: Patient seen and examined and discussed with my partner Dr. Marca Ancona as well as the patient's husband and she has had nausea and decreased appetite from some time and it started even before her nursing home visit and her broken ankle and fall and her husband says her mucus without diarrhea has been better and supposedly her previous gastroenterologist for a retired had done a flex sig and the rest of her hospital computer chart was reviewed and we answered all of their questions  Objective: Vital signs stable afebrile no acute distress abdomen is soft nontender  Assessment: Multiple medical problems and complaints  Plan: Will check our office computer chart over the next few days to review the results of the previous flex sig as well as any other thoughts from Dr. Matthias Hughs however we might consider a HIDA scan for subacute cholecystitis even though she has no pain and we also talked about depression playing a role with these type of symptoms or medicine side effect and please call me if I can be of any further assistance this holiday weekend otherwise we will ask hospital rounding team to check on early next week and await Dr. Laurey Morale extra studies although I am not optimistic sprue or H. pylori is playing a role in her symptomatology  [facility]  office [phone] After 5PM or if no answer call [phone]

## 2021-07-13 NOTE — Progress Notes (Addendum)
PROGRESS NOTE    Sally Wise   [id number]  DOB: Jan 02, 1946  DOA: 07/10/2021 PCP: [name], MD   Brief Narrative:  Sally Wise is a 75 y.o. female with medical history significant of pA fib, hypothyroidism, chronic thrombocytopenia, chronic HFpEF, HLD, HTN, depression. Presenting with weakness and lethargy. She fractured her ankle in October 2022 and since then has been living at a SNF.  She has barely been eating since then and is still unable to walk.  She was admitted on 12/12 for dehydration, hypotension and AKI. She returns now for the similar complaints.  She is found to have severe anasarca. Patient and husband are unable to tell if she has actually lost any weight. She was given Rocephin, IV fluids and albumin in the ED and admitted for further work-up.  Subjective: Ongoing poor oral intake- she insists she is eating  but per husband, she did not eat dinner last night and only had a few bites of her biscuit today.    Assessment & Plan:   Principal Problem:   Anasarca, hypo-albuminemia due to poor oral intake -Elevated BUN/creatinine ratio and lactic acid seems to suggest that she is intravascularly depleted while having severe anasarca -She has received IV fluids with albumin in the ED-hold further IV fluids and follow urine output carefully- foley placed as she is incontinent and too swollen to manage a purewick -if urine output is dropping, she will need more IV fluids- she had 3400 cc of urine yesterday - she has been receiving BID Juven at the SNF  - ECHO completed and reviewed- she is laying flat, is not hypoxic and had no pulm edema on the CXR and therefore I am not concerned about left heart failure - no right heart failure on ECHO -  TSH 8.603 and Free T 4 0.88- likely sick euthyroid and does not need Synthroid dose adjusted - normal urine/pro creatinine ratio  Active Problems: Poor oral intake, new diagnosis of PUD- heme + -poor intake ongoing  for many months-no other significant GI symptoms other than occasional nausea - Stool Hemoccult is positive - I asked GI to evaluate for heme positive stool/possible colon cancer or PUD - have requested a nutrition consult and encourage the patient to eat even if she has no appetite - 12/30 EGD > - Erythematous mucosa in the gastric body. (Very friable and easily bleeding)                           - Erythematous duodenopathy.                           - Non-bleeding duodenal ulcer with a clean ulcer                            base (Forrest Class III). -I communicated with Dr Therisa Doyne  recommendations- BID Protonix x 1 month and Sucralfate 1 gm QID x 2 wks, stool for H pylori antigen and celiac serology (ordered) - I reviewed CT chest abdomen pelvis and also discussed scans with Dr Therisa Doyne- not suspicious for cancer - she is still barely eating- have advised nursing staff to feed her and document amount of meals eaten  Hyperkalemia K - 5.4 today ? Hemolyzed- recheck tomorrow   Depression - on Lexapro- med rec shows she was on Remeron in the past- still very depressed -  have asked psych to evaluate her- they have recommended that she go back on Remeron  Elevated INR - Patient is not on any anticoagulation and INR is 2.1-2.2 - ? If this is due to being malnourished - Started vitamin K supplementation daily and today is day 3 of replacemnt- - INR still not improving- will ask for heme consult as she his heme + and oozing blood from the GI tract.     Morbid obesity (HCC)- 299 lbs Mild Fatty liver Body mass index is 45.46 kg/m. - she has been the same weight since the Oct admission despite statements that she is unable to eat  - ? If she has lost body fat and gained fluid weight  Chronic kidney disease stage IIIa - Creatinine in October was as low as 0.9 and 1.0 which is around where she is now, placing her in CKD stage IIIa  Normocytic anemia - Hemoglobin of 10 when admitted has dropped  to 7.1 after IV fluids -Continue to follow - anemia panel shows an elevated ferritin and B12, normal folate  Thrombocytopenia - Appears to be chronic  Chronically debilitated - has not walked since her ankle fracture - At the SNF, she is lifted out of bed to a wheelchair and does not ambulate   Pressure ulcers Pressure Injury 06/25/21 Coccyx Mid;Lower Stage 3 -  Full thickness tissue loss. Subcutaneous fat may be visible but bone, tendon or muscle are NOT exposed. 1.5 cm by 1 cm (Active)  06/25/21 0130  Location: Coccyx  Location Orientation: Mid;Lower  Staging: Stage 3 -  Full thickness tissue loss. Subcutaneous fat may be visible but bone, tendon or muscle are NOT exposed.  Wound Description (Comments): 1.5 cm by 1 cm  Present on Admission: Yes     Pressure Injury Pretibial Distal;Left Unstageable - Full thickness tissue loss in which the base of the injury is covered by slough (yellow, tan, gray, green or brown) and/or eschar (tan, brown or black) in the wound bed. 2 cm x 1 cm (Active)     Location: Pretibial  Location Orientation: Distal;Left  Staging: Unstageable - Full thickness tissue loss in which the base of the injury is covered by slough (yellow, tan, gray, green or brown) and/or eschar (tan, brown or black) in the wound bed.  Wound Description (Comments): 2 cm x 1 cm  Present on Admission: Yes     Pressure Injury 07/10/21 Groin Medial Stage 1 -  Intact skin with non-blanchable redness of a localized area usually over a bony prominence. medical equipment PI: lines of nonblanchable redness where purewick tubing might have been (Active)  07/10/21 1906  Location: Groin  Location Orientation: Medial  Staging: Stage 1 -  Intact skin with non-blanchable redness of a localized area usually over a bony prominence.  Wound Description (Comments): medical equipment PI: lines of nonblanchable redness where purewick tubing might have been  Present on Admission: Yes       Time  spent in minutes: 45- discussed plan with psych and heme DVT prophylaxis: Place and maintain sequential compression device Start: 07/11/21 1402 Place TED hose Start: 07/10/21 1808  Code Status: full code Family Communication: husband Level of Care: Level of care: Med-Surg Disposition Plan:  Status is: Inpatient  Remains inpatient appropriate because: onc consult f/u oral intake and urine output Consultants:  GI Procedures:  EGD Antimicrobials:  Anti-infectives (From admission, onward)    Start     Dose/Rate Route Frequency Ordered Stop   07/11/21 0800  cefTRIAXone (ROCEPHIN) 2 g in sodium chloride 0.9 % 100 mL IVPB  Status:  Discontinued        2 g 200 mL/hr over 30 Minutes Intravenous Every 24 hours 07/10/21 1807 07/11/21 0906   07/10/21 0700  cefTRIAXone (ROCEPHIN) 2 g in sodium chloride 0.9 % 100 mL IVPB        2 g 200 mL/hr over 30 Minutes Intravenous  Once 07/10/21 0654 07/10/21 0816        Objective: Vitals:   07/12/21 1500 07/12/21 2034 07/12/21 2218 07/13/21 0520  BP: 92/62 (!) 96/49 91/63 (!) 122/57  Pulse: 83 77 80 76  Resp: 18 18  18   Temp: 98.5 F (36.9 C) 98.2 F (36.8 C)  98.4 F (36.9 C)  TempSrc: Axillary Oral  Oral  SpO2: 100% 100%  100%  Weight:      Height:        Intake/Output Summary (Last 24 hours) at 07/13/2021 1245 Last data filed at 07/13/2021 0948 Gross per 24 hour  Intake 1095 ml  Output 2800 ml  Net -1705 ml    Filed Weights   07/10/21 0338 07/11/21 0620 07/12/21 1018  Weight: (!) 144.2 kg 135.6 kg 135.6 kg    Examination: General exam: Appears comfortable  HEENT: PERRLA, oral mucosa moist, no sclera icterus or thrush Respiratory system: Clear to auscultation. Respiratory effort normal. Cardiovascular system: S1 & S2 heard, regular rate and rhythm Gastrointestinal system: Abdomen soft, non-tender, nondistended. Normal bowel sounds   Central nervous system: Alert and oriented. No focal neurological deficits. Extremities: No  cyanosis, clubbing or edema Skin: No rashes or ulcers Psychiatry: flat affect    Data Reviewed: I have personally reviewed following labs and imaging studies  CBC: Recent Labs  Lab 07/10/21 0350 07/10/21 1255 07/10/21 2112 07/11/21 0350 07/12/21 0622 07/13/21 0934  WBC 8.8 7.4  --  6.5 5.6 5.7  NEUTROABS 4.6 4.0  --   --   --   --   HGB 10.0* 9.1* 8.5* 7.1* 7.6* 8.1*  HCT 32.0* 28.5* 26.1* 22.3* 23.6* 26.4*  MCV 95.8 93.8  --  94.1 94.8 97.1  PLT PLATELET CLUMPS NOTED ON SMEAR, UNABLE TO ESTIMATE 108*  --  83* 95* 102*    Basic Metabolic Panel: Recent Labs  Lab 07/10/21 0350 07/11/21 0350 07/12/21 0446 07/13/21 0912  NA 140 139 139 140  K 4.2 3.8 3.8 5.4*  CL 109 111 112* 116*  CO2 21* 20* 20* 18*  GLUCOSE 88 75 85 95  BUN 110* 114* 97* 91*  CREATININE 1.17* 1.28* 1.36* 1.08*  CALCIUM 8.6* 7.9* 7.9* 7.7*    GFR: Estimated Creatinine Clearance: 63.8 mL/min (A) (by C-G formula based on SCr of 1.08 mg/dL (H)). Liver Function Tests: Recent Labs  Lab 07/10/21 0350 07/11/21 0350  AST 16 16  ALT 19 15  ALKPHOS 319* 261*  BILITOT 1.2 1.1  PROT 5.5* 4.6*  ALBUMIN 2.2* 2.0*    No results for input(s): LIPASE, AMYLASE in the last 168 hours. No results for input(s): AMMONIA in the last 168 hours. Coagulation Profile: Recent Labs  Lab 07/10/21 0350 07/11/21 0515 07/12/21 0446 07/13/21 0934  INR 2.1* 2.2* 2.2* 2.2*    Cardiac Enzymes: No results for input(s): CKTOTAL, CKMB, CKMBINDEX, TROPONINI in the last 168 hours. BNP (last 3 results) No results for input(s): PROBNP in the last 8760 hours. HbA1C: No results for input(s): HGBA1C in the last 72 hours. CBG: No results for input(s): GLUCAP in the last  168 hours. Lipid Profile: No results for input(s): CHOL, HDL, LDLCALC, TRIG, CHOLHDL, LDLDIRECT in the last 72 hours. Thyroid Function Tests: Recent Labs    07/11/21 1504  TSH 8.603*  FREET4 0.88    Anemia Panel: Recent Labs    07/12/21 1453   VITAMINB12 4,634*  FOLATE 48.7  FERRITIN 594*  TIBC PENDING  IRON 44  RETICCTPCT 3.4*   Urine analysis:    Component Value Date/Time   COLORURINE YELLOW 07/10/2021 0915   APPEARANCEUR CLEAR 07/10/2021 0915   LABSPEC 1.014 07/10/2021 0915   PHURINE 5.0 07/10/2021 0915   GLUCOSEU NEGATIVE 07/10/2021 0915   HGBUR NEGATIVE 07/10/2021 0915   BILIRUBINUR NEGATIVE 07/10/2021 0915   BILIRUBINUR neg 04/08/2016 0916   KETONESUR NEGATIVE 07/10/2021 0915   PROTEINUR NEGATIVE 07/10/2021 0915   UROBILINOGEN negative 04/08/2016 0916   UROBILINOGEN 0.2 01/15/2013 1538   NITRITE NEGATIVE 07/10/2021 0915   LEUKOCYTESUR NEGATIVE 07/10/2021 0915   Sepsis Labs: @LABRCNTIP (procalcitonin:4,lacticidven:4) ) Recent Results (from the past 240 hour(s))  Blood Culture (routine x 2)     Status: Abnormal   Collection Time: 07/10/21  3:50 AM   Specimen: BLOOD  Result Value Ref Range Status   Specimen Description   Final    BLOOD LEFT ANTECUBITAL Performed at [facility], [place] [address] [place], [place] [address]    Special Requests   Final    BOTTLES DRAWN AEROBIC AND ANAEROBIC Blood Culture adequate volume Performed at [facility] [address], [place], [place] [address]    Culture  Setup Time   Final    GRAM POSITIVE COCCI IN CLUSTERS IN BOTH AEROBIC AND ANAEROBIC BOTTLES CRITICAL RESULT CALLED TO, READ BACK BY AND VERIFIED WITH: J. LEGGE 07/11/21 1154 FH    Culture (A)  Final    STAPHYLOCOCCUS EPIDERMIDIS THE SIGNIFICANCE OF ISOLATING THIS ORGANISM FROM A SINGLE SET OF BLOOD CULTURES WHEN MULTIPLE SETS ARE DRAWN IS UNCERTAIN. PLEASE NOTIFY THE MICROBIOLOGY DEPARTMENT WITHIN ONE WEEK IF SPECIATION AND SENSITIVITIES ARE REQUIRED. Performed at [facility], [place] [address] [place], [place] [address]    Report Status 07/13/2021 FINAL  Final  Blood Culture ID Panel (Reflexed)     Status: Abnormal   Collection Time: 07/10/21  3:50 AM  Result  Value Ref Range Status   Enterococcus faecalis NOT DETECTED NOT DETECTED Final   Enterococcus Faecium NOT DETECTED NOT DETECTED Final   Listeria monocytogenes NOT DETECTED NOT DETECTED Final   Staphylococcus species DETECTED (A) NOT DETECTED Final    Comment: CRITICAL RESULT CALLED TO, READ BACK BY AND VERIFIED WITH: J. LEGGE 07/11/21 1154 FH    Staphylococcus aureus (BCID) NOT DETECTED NOT DETECTED Final   Staphylococcus epidermidis DETECTED (A) NOT DETECTED Final    Comment: Methicillin (oxacillin) resistant coagulase negative staphylococcus. Possible blood culture contaminant (unless isolated from more than one blood culture draw or clinical case suggests pathogenicity). No antibiotic treatment is indicated for blood  culture contaminants. CRITICAL RESULT CALLED TO, READ BACK BY AND VERIFIED WITH: J. LEGGE 07/11/21 1154 FH    Staphylococcus lugdunensis NOT DETECTED NOT DETECTED Final   Streptococcus species NOT DETECTED NOT DETECTED Final   Streptococcus agalactiae NOT DETECTED NOT DETECTED Final   Streptococcus pneumoniae NOT DETECTED NOT DETECTED Final   Streptococcus pyogenes NOT DETECTED NOT DETECTED Final   A.calcoaceticus-baumannii NOT DETECTED NOT DETECTED Final   Bacteroides fragilis NOT DETECTED NOT DETECTED Final   Enterobacterales NOT DETECTED NOT DETECTED Final   Enterobacter cloacae complex  NOT DETECTED NOT DETECTED Final   Escherichia coli NOT DETECTED NOT DETECTED Final   Klebsiella aerogenes NOT DETECTED NOT DETECTED Final   Klebsiella oxytoca NOT DETECTED NOT DETECTED Final   Klebsiella pneumoniae NOT DETECTED NOT DETECTED Final   Proteus species NOT DETECTED NOT DETECTED Final   Salmonella species NOT DETECTED NOT DETECTED Final   Serratia marcescens NOT DETECTED NOT DETECTED Final   Haemophilus influenzae NOT DETECTED NOT DETECTED Final   Neisseria meningitidis NOT DETECTED NOT DETECTED Final   Pseudomonas aeruginosa NOT DETECTED NOT DETECTED Final    Stenotrophomonas maltophilia NOT DETECTED NOT DETECTED Final   Candida albicans NOT DETECTED NOT DETECTED Final   Candida auris NOT DETECTED NOT DETECTED Final   Candida glabrata NOT DETECTED NOT DETECTED Final   Candida krusei NOT DETECTED NOT DETECTED Final   Candida parapsilosis NOT DETECTED NOT DETECTED Final   Candida tropicalis NOT DETECTED NOT DETECTED Final   Cryptococcus neoformans/gattii NOT DETECTED NOT DETECTED Final   Methicillin resistance mecA/C DETECTED (A) NOT DETECTED Final    Comment: CRITICAL RESULT CALLED TO, READ BACK BY AND VERIFIED WITH: J. LEGGE 07/11/21 1154 FH Performed at [facility], [address], [place], [place] [address]   Urine Culture     Status: None   Collection Time: 07/10/21  9:15 AM   Specimen: In/Out Cath Urine  Result Value Ref Range Status   Specimen Description   Final    IN/OUT CATH URINE Performed at [facility] [address] [place], [place] [address]    Special Requests   Final    NONE Performed at [facility], [place] [address], [place], [place] [address]    Culture   Final    NO GROWTH Performed at [facility], [place] [address], [place], [place] [address]    Report Status 07/11/2021 FINAL  Final  Blood Culture (routine x 2)     Status: None (Preliminary result)   Collection Time: 07/10/21  7:09 PM   Specimen: BLOOD  Result Value Ref Range Status   Specimen Description   Final    BLOOD SITE NOT SPECIFIED Performed at [facility] [address], [place], [place] [address]    Special Requests   Final    BOTTLES DRAWN AEROBIC ONLY Blood Culture results may not be optimal due to an inadequate volume of blood received in culture bottles Performed at [facility] [address], [place], [place] [address]    Culture   Final    NO GROWTH 3 DAYS Performed at [facility], [place] [address], [place], [place] [address]    Report Status  PENDING  Incomplete         Radiology Studies: CT CHEST ABDOMEN PELVIS W CONTRAST  Result Date: 07/11/2021 CLINICAL DATA:  Unintended weight loss EXAM: CT CHEST, ABDOMEN, AND PELVIS WITH CONTRAST TECHNIQUE: Multidetector CT imaging of the chest, abdomen and pelvis was performed following the standard protocol during bolus administration of intravenous contrast. CONTRAST:  57mL OMNIPAQUE IOHEXOL 350 MG/ML SOLN COMPARISON:  CT chest 08/10/2020.  CT abdomen and pelvis 06/26/2021 FINDINGS: CT CHEST FINDINGS Cardiovascular: Mild cardiac enlargement. Small pericardial effusion. Coronary artery and aortic calcifications. No aortic aneurysm. Mediastinum/Nodes: Esophagus is decompressed. Mediastinal lymph nodes are not pathologically enlarged. Thyroid gland is unremarkable. Lungs/Pleura: Small bilateral pleural effusions with basilar atelectasis or consolidation. Nodule in the right middle lung measuring 5 mm diameter. No change since prior study.  No pneumothorax. Musculoskeletal: Degenerative changes in the spine. No destructive bone lesions. CT ABDOMEN PELVIS FINDINGS Hepatobiliary: Mild diffuse fatty infiltration of the liver. Cholelithiasis with small stones layering in the gallbladder. Additional noncalcified nodular filling defect in the body of the gallbladder measuring about 2.6 cm diameter. This could be a noncalcified stone, sludge, or polypoid mass. Ultrasound correlation is suggested. No bile duct dilatation. Pancreas: Fatty atrophy of the pancreas. Spleen: Normal in size without focal abnormality. Adrenals/Urinary Tract: No adrenal gland nodules. Bilateral renal parenchymal atrophy and scarring. No solid mass or hydronephrosis. Bladder is decompressed with a Foley catheter. Stomach/Bowel: Stomach, small bowel, and colon are not abnormally distended. Scattered colonic diverticula. No evidence of diverticulitis. Appendix is normal. Vascular/Lymphatic: Scattered aortic calcification. No significant  lymphadenopathy. Reproductive: Uterine calcifications, likely fibroids. 2.3 cm diameter structure in the right ovary containing fat and calcification, likely a small dermoid cyst. No change. Other: No free air or free fluid in the abdomen. Edema in the subcutaneous fat of the flank regions. Fatty atrophy of the abdominal wall musculature. Musculoskeletal: Degenerative changes in the lumbar spine. Lumbar scoliosis convex towards the right. No destructive bone lesions. IMPRESSION: 1. Cardiac enlargement with small pericardial effusion. 2. Small bilateral pleural effusions with basilar atelectasis or consolidation. 3. 5 mm right solid pulmonary nodule. No routine follow-up imaging is recommended per Fleischner Society Guidelines. These guidelines do not apply to immunocompromised patients and patients with cancer. Follow up in patients with significant comorbidities as clinically warranted. For lung cancer screening, adhere to Lung-RADS guidelines. Reference: Radiology. 2017; 284(1):228-43. 4. Cholelithiasis. 5. Noncalcified structure in the gallbladder is nonspecific and could represent stone, sludge, or mass lesion. Ultrasound correlation suggested. 6. Fatty infiltration of the liver. 7. Aortic atherosclerosis. 8. Foley catheter decompresses the bladder. 9. Calcified uterine fibroids. 10. 2.3 cm right ovarian structure containing fat and calcification, likely a small dermoid cyst. No change. Electronically Signed   By: Lucienne Capers M.D.   On: 07/11/2021 15:19   ECHOCARDIOGRAM COMPLETE  Result Date: 07/12/2021    ECHOCARDIOGRAM REPORT   Patient Name:   Sally Wise [facility] Date of Exam: 07/12/2021 Medical Rec #:  [id number]        Height:       68.0 in Accession #:    [id number]       Weight:       299.0 lb Date of Birth:  [birth year]         BSA:          2.425 m Patient Age:    33 years         BP:           105/51 mmHg Patient Gender: F                HR:           81 bpm. Exam Location:  Inpatient Procedure: 2D  Echo, Cardiac Doppler and Color Doppler Indications:    Edema  History:        Patient has prior history of Echocardiogram examinations. CHF.  Sonographer:    Jyl Heinz Referring Phys: [place]  1. Left ventricular ejection fraction, by estimation, is 60 to 65%. The left ventricle has normal function. The left ventricle has no regional wall motion abnormalities. Left ventricular diastolic parameters are consistent with Grade I diastolic dysfunction (impaired relaxation).  2. Right ventricular systolic function is normal. The right ventricular size is normal. Tricuspid regurgitation signal is inadequate for assessing PA  pressure.  3. A small pericardial effusion is present. The pericardial effusion is circumferential.  4. The mitral valve is grossly normal. No evidence of mitral valve regurgitation. No evidence of mitral stenosis.  5. The aortic valve is tricuspid. Aortic valve regurgitation is not visualized. No aortic stenosis is present.  6. The inferior vena cava is normal in size with greater than 50% respiratory variability, suggesting right atrial pressure of 3 mmHg. FINDINGS  Left Ventricle: Left ventricular ejection fraction, by estimation, is 60 to 65%. The left ventricle has normal function. The left ventricle has no regional wall motion abnormalities. The left ventricular internal cavity size was normal in size. There is  no left ventricular hypertrophy. Left ventricular diastolic parameters are consistent with Grade I diastolic dysfunction (impaired relaxation). Right Ventricle: The right ventricular size is normal. No increase in right ventricular wall thickness. Right ventricular systolic function is normal. Tricuspid regurgitation signal is inadequate for assessing PA pressure. Left Atrium: Left atrial size was normal in size. Right Atrium: Right atrial size was normal in size. Pericardium: A small pericardial effusion is present. The pericardial effusion is circumferential.  Presence of epicardial fat layer. Mitral Valve: The mitral valve is grossly normal. No evidence of mitral valve regurgitation. No evidence of mitral valve stenosis. Tricuspid Valve: The tricuspid valve is grossly normal. Tricuspid valve regurgitation is trivial. No evidence of tricuspid stenosis. Aortic Valve: The aortic valve is tricuspid. Aortic valve regurgitation is not visualized. No aortic stenosis is present. Aortic valve peak gradient measures 7.2 mmHg. Pulmonic Valve: The pulmonic valve was grossly normal. Pulmonic valve regurgitation is not visualized. No evidence of pulmonic stenosis. Aorta: The aortic root and ascending aorta are structurally normal, with no evidence of dilitation. Venous: The inferior vena cava is normal in size with greater than 50% respiratory variability, suggesting right atrial pressure of 3 mmHg. IAS/Shunts: The atrial septum is grossly normal. Additional Comments: There is a small pleural effusion in the left lateral region.  LEFT VENTRICLE PLAX 2D LVIDd:         4.10 cm     Diastology LVIDs:         2.80 cm     LV e' medial:    4.68 cm/s LV PW:         1.00 cm     LV E/e' medial:  13.3 LV IVS:        1.10 cm     LV e' lateral:   5.98 cm/s LVOT diam:     2.20 cm     LV E/e' lateral: 10.4 LV SV:         76 LV SV Index:   31 LVOT Area:     3.80 cm  LV Volumes (MOD) LV vol d, MOD A2C: 84.4 ml LV vol d, MOD A4C: 86.7 ml LV vol s, MOD A2C: 35.0 ml LV vol s, MOD A4C: 32.9 ml LV SV MOD A2C:     49.4 ml LV SV MOD A4C:     86.7 ml LV SV MOD BP:      52.6 ml RIGHT VENTRICLE            IVC RV Basal diam:  3.30 cm    IVC diam: 1.70 cm RV Mid diam:    2.80 cm RV S prime:     8.49 cm/s TAPSE (M-mode): 2.1 cm LEFT ATRIUM             Index        RIGHT  ATRIUM           Index LA diam:        2.40 cm 0.99 cm/m   RA Area:     19.60 cm LA Vol (A2C):   24.7 ml 10.19 ml/m  RA Volume:   56.30 ml  23.22 ml/m LA Vol (A4C):   42.0 ml 17.32 ml/m LA Biplane Vol: 35.6 ml 14.68 ml/m  AORTIC VALVE AV  Area (Vmax): 2.79 cm AV Vmax:        134.00 cm/s AV Peak Grad:   7.2 mmHg LVOT Vmax:      98.30 cm/s LVOT Vmean:     75.800 cm/s LVOT VTI:       0.199 m  AORTA Ao Root diam: 3.10 cm Ao Asc diam:  3.20 cm MITRAL VALVE MV Area (PHT): 5.84 cm    SHUNTS MV Decel Time: 130 msec    Systemic VTI:  0.20 m MV E velocity: 62.40 cm/s  Systemic Diam: 2.20 cm MV A velocity: 88.60 cm/s MV E/A ratio:  0.70 Eleonore Chiquito MD Electronically signed by Eleonore Chiquito MD Signature Date/Time: 07/12/2021/3:35:58 PM    Final       Scheduled Meds:  (feeding supplement) PROSource Plus  30 mL Oral BID BM   ascorbic acid  500 mg Oral BID   Chlorhexidine Gluconate Cloth  6 each Topical Daily   cholecalciferol  5,000 Units Oral Daily   escitalopram  5 mg Oral QHS   feeding supplement  237 mL Oral BID BM   folic acid  1 mg Oral Daily   lactobacillus acidophilus & bulgar  2 tablet Oral TID   levothyroxine  125 mcg Oral Q0600   multivitamin with minerals  1 tablet Oral Daily   pantoprazole  40 mg Oral BID AC   sucralfate  1 g Oral TID WC & HS   zinc sulfate  220 mg Oral Daily   Continuous Infusions:   LOS: 2 days      Debbe Odea, MD [facility] Pager: [email or web address] 07/13/2021, 12:45 PM

## 2021-07-14 DIAGNOSIS — G9341 Metabolic encephalopathy: Secondary | ICD-10-CM

## 2021-07-14 DIAGNOSIS — K760 Fatty (change of) liver, not elsewhere classified: Secondary | ICD-10-CM

## 2021-07-14 DIAGNOSIS — K279 Peptic ulcer, site unspecified, unspecified as acute or chronic, without hemorrhage or perforation: Secondary | ICD-10-CM

## 2021-07-14 DIAGNOSIS — R601 Generalized edema: Secondary | ICD-10-CM | POA: Diagnosis not present

## 2021-07-14 DIAGNOSIS — R799 Abnormal finding of blood chemistry, unspecified: Secondary | ICD-10-CM

## 2021-07-14 DIAGNOSIS — R791 Abnormal coagulation profile: Secondary | ICD-10-CM

## 2021-07-14 DIAGNOSIS — R630 Anorexia: Secondary | ICD-10-CM

## 2021-07-14 LAB — CBC
HCT: 23.8 % — ABNORMAL LOW (ref 36.0–46.0)
Hemoglobin: 7.4 g/dL — ABNORMAL LOW (ref 12.0–15.0)
MCH: 30.1 pg (ref 26.0–34.0)
MCHC: 31.1 g/dL (ref 30.0–36.0)
MCV: 96.7 fL (ref 80.0–100.0)
Platelets: 91 10*3/uL — ABNORMAL LOW (ref 150–400)
RBC: 2.46 MIL/uL — ABNORMAL LOW (ref 3.87–5.11)
RDW: 23.8 % — ABNORMAL HIGH (ref 11.5–15.5)
WBC: 6.3 10*3/uL (ref 4.0–10.5)
nRBC: 0.5 % — ABNORMAL HIGH (ref 0.0–0.2)

## 2021-07-14 LAB — BASIC METABOLIC PANEL
Anion gap: 7 (ref 5–15)
BUN: 86 mg/dL — ABNORMAL HIGH (ref 8–23)
CO2: 21 mmol/L — ABNORMAL LOW (ref 22–32)
Calcium: 7.9 mg/dL — ABNORMAL LOW (ref 8.9–10.3)
Chloride: 114 mmol/L — ABNORMAL HIGH (ref 98–111)
Creatinine, Ser: 1.09 mg/dL — ABNORMAL HIGH (ref 0.44–1.00)
GFR, Estimated: 52 mL/min — ABNORMAL LOW (ref >60)
Glucose, Bld: 92 mg/dL (ref 70–99)
Potassium: 3.5 mmol/L (ref 3.5–5.1)
Sodium: 142 mmol/L (ref 135–145)

## 2021-07-14 LAB — PROTIME-INR
INR: 2.4 — ABNORMAL HIGH (ref 0.8–1.2)
Prothrombin Time: 25.8 seconds — ABNORMAL HIGH (ref 11.4–15.2)

## 2021-07-14 LAB — CORTISOL: Cortisol, Plasma: 9.7 ug/dL

## 2021-07-14 LAB — LACTIC ACID, PLASMA: Lactic Acid, Venous: 2 mmol/L (ref 0.5–1.9)

## 2021-07-14 LAB — AMMONIA
Ammonia: 45 umol/L — ABNORMAL HIGH (ref 9–35)
Ammonia: 48 umol/L — ABNORMAL HIGH (ref 9–35)

## 2021-07-14 MED ORDER — LACTULOSE 10 GM/15ML PO SOLN
20.0000 g | Freq: Three times a day (TID) | ORAL | Status: DC
  Administered 2021-07-14 – 2021-07-15 (×2): 20 g via ORAL
  Filled 2021-07-14 (×2): qty 30

## 2021-07-14 MED ORDER — COSYNTROPIN 0.25 MG IJ SOLR
0.2500 mg | Freq: Once | INTRAMUSCULAR | Status: DC
  Filled 2021-07-14: qty 0.25

## 2021-07-14 MED ORDER — MIRTAZAPINE 15 MG PO TABS
15.0000 mg | ORAL_TABLET | Freq: Every day | ORAL | Status: DC
  Administered 2021-07-14 – 2021-07-28 (×14): 15 mg via ORAL
  Filled 2021-07-14 (×14): qty 1

## 2021-07-14 NOTE — Assessment & Plan Note (Addendum)
Unclear etiology -> liver failure, cirrhosis?. Hematology consulted and have recommended IV vitamin K. Treatment completed. Follow INR - remains elevated

## 2021-07-14 NOTE — Progress Notes (Signed)
PROGRESS NOTE    [facility]  [id number] DOB: Oct 04, 1945 DOA: 07/10/2021 PCP: Sally Cruel, MD  Chief Complaint  Patient presents with   Weakness    Brief Narrative:  76 yo with hx atrial fibrillation, hypothyroidism, chronic thrombocytopenia, HFpEF, HLD, HTN, depression and multiple other medical issues who presented with weakness and lethargy.  She's had poor PO intake.  SOf note, she fractured her ankle in October 2022 and has [facility] since then.  She was admitted with dehydration, hypotension, and AKI on 12/12 and returned on 12/28 with similar issues.      Assessment & Plan:   Principal Problem:   Anasarca Active Problems:   Acute metabolic encephalopathy   Lack of appetite   PUD (peptic ulcer disease)   Chronic kidney disease with active medical management without dialysis, stage 3 (moderate) (HCC)   Elevated INR   Thrombocytopenia (HCC)   Fatty liver disease, nonalcoholic   Anemia   Ankle fracture   Hypoalbuminemia   Pressure ulcer   Morbid obesity (HCC)   PAF (paroxysmal atrial fibrillation) (HCC)   * Anasarca- (present on admission) At presentation thought to be intravascularly depleted S/p IVF with albumin Follow UOP - has foley for I/O given incontinence, too swollen to manage purewick Strict I/O, daily weights UA without notable proteinuria, UP/C 0.17 Echo with EF [id number], grade 1 diastolic dysfunction, normal RVSF, small pericardial effusion (see report) Continue to monitor   Lack of appetite Unclear cause GI c/s -> s/p EGD with normal esophagus, erythematous mucosa in gastric body, erythematous duodenopathy, nonbleeding duodenal ulcer with clean base Follow H pylori ag and celiac serology PPI BID x1 month, sucralfate 1 g QID x2 weeks Negative TTG, pending reticulin ab Pending H pylori stool ab, pending gliadin ab  Acute metabolic encephalopathy Weakness, lethargy Mild at this time TSH elevated, normal free t4 b12  elevated, folate wnl Ammonia mildly elevated -> follow repeat Cortisol low normal, follow acth stim test Psychiatry who recommended transition from lexapro to remeron (12/31)   PUD (peptic ulcer disease) EGD with erythematous mucosa in gastric body, erythematous duodenopathy, nonbleeding duodenal ulcer with clean base Pending h pylori stool ab, celiac serology PPI, sucralfate  Elevated INR Unclear etiology Elevated despite vitamin K administration Consider heme c/s  Chronic kidney disease with active medical management without dialysis, stage 3 (moderate) ([place])- (present on admission) Creatinine appears close to baseline, follow  Fatty liver disease, nonalcoholic Consider additional liver imaging  Thrombocytopenia ([place])- (present on admission) chronic  Anemia- (present on admission) Fluctuating, trend C/w AOCD - b12/folate wnl  Ankle fracture- (present on admission) S/p Open treatment left ankle trimalleolar fracture with internal fixation including fixation of the posterior lip 05/07/2021  she's in [facility] cam boot which can be removed for ROM in PT.  She can bear weight to [facility] max of 75lbs in the boot for the next month.  Hypoalbuminemia- (present on admission) Due to poor nutrition?  Pressure ulcer Pressure Injury 06/25/21 Coccyx Mid;Lower Stage 3 -  Full thickness tissue loss. Subcutaneous fat may be visible but bone, tendon or muscle are NOT exposed. 1.5 cm by 1 cm (Active)  06/25/21 0130  Location: Coccyx  Location Orientation: Mid;Lower  Staging: Stage 3 -  Full thickness tissue loss. Subcutaneous fat may be visible but bone, tendon or muscle are NOT exposed.  Wound Description (Comments): 1.5 cm by 1 cm  Present on Admission: Yes     Pressure Injury Pretibial Distal;Left Unstageable - Full  thickness tissue loss in which the base of the injury is covered by slough (yellow, tan, gray, green or brown) and/or eschar (tan, brown or black) in the wound bed. 2 cm x 1 cm  (Active)     Location: Pretibial  Location Orientation: Distal;Left  Staging: Unstageable - Full thickness tissue loss in which the base of the injury is covered by slough (yellow, tan, gray, green or brown) and/or eschar (tan, brown or black) in the wound bed.  Wound Description (Comments): 2 cm x 1 cm  Present on Admission: Yes     Pressure Injury 07/10/21 Groin Medial Stage 1 -  Intact skin with non-blanchable redness of [facility] localized area usually over [facility] bony prominence. medical equipment PI: lines of nonblanchable redness where purewick tubing might have been (Active)  07/10/21 1906  Location: Groin  Location Orientation: Medial  Staging: Stage 1 -  Intact skin with non-blanchable redness of [facility] localized area usually over [facility] bony prominence.  Wound Description (Comments): medical equipment PI: lines of nonblanchable redness where purewick tubing might have been  Present on Admission: Yes         DVT prophylaxis: SCD Code Status: full Family Communication: husband Disposition:   Status is: Inpatient  Remains inpatient appropriate because: continued need for inpatient workup       Consultants:  GI pysch  Procedures:  Echo IMPRESSIONS     1. Left ventricular ejection fraction, by estimation, is 60 to 65%. The  left ventricle has normal function. The left ventricle has no regional  wall motion abnormalities. Left ventricular diastolic parameters are  consistent with Grade I diastolic  dysfunction (impaired relaxation).   2. Right ventricular systolic function is normal. The right ventricular  size is normal. Tricuspid regurgitation signal is inadequate for assessing  PA pressure.   3. [facility] small pericardial effusion is present. The pericardial effusion is  circumferential.   4. The mitral valve is grossly normal. No evidence of mitral valve  regurgitation. No evidence of mitral stenosis.   5. The aortic valve is tricuspid. Aortic valve regurgitation is not   visualized. No aortic stenosis is present.   6. The inferior vena cava is normal in size with greater than 50%  respiratory variability, suggesting right atrial pressure of 3 mmHg.   Antimicrobials:  Anti-infectives (From admission, onward)    Start     Dose/Rate Route Frequency Ordered Stop   07/11/21 0800  cefTRIAXone (ROCEPHIN) 2 g in sodium chloride 0.9 % 100 mL IVPB  Status:  Discontinued        2 g 200 mL/hr over 30 Minutes Intravenous Every 24 hours 07/10/21 1807 07/11/21 0906   07/10/21 0700  cefTRIAXone (ROCEPHIN) 2 g in sodium chloride 0.9 % 100 mL IVPB        2 g 200 mL/hr over 30 Minutes Intravenous  Once 07/10/21 0654 07/10/21 0816       Subjective: Asking to be repositioned  Objective: Vitals:   07/13/21 1423 07/13/21 2217 07/14/21 0600 07/14/21 1346  BP: (!) 95/50 104/61 113/63 114/68  Pulse: 78 81 81 84  Resp: 15 16 15 18   Temp: 97.7 F (36.5 C) 98.4 F (36.9 C) 98.3 F (36.8 C) 98.8 F (37.1 C)  TempSrc: Oral Oral Oral Oral  SpO2: 100% 100% 100% 99%  Weight:      Height:        Intake/Output Summary (Last 24 hours) at 07/14/2021 1445 Last data filed at 07/14/2021 1000 Gross per 24 hour  Intake 300 ml  Output 1000 ml  Net -700 ml   Filed Weights   07/10/21 0338 07/11/21 0620 07/12/21 1018  Weight: (!) 144.2 kg 135.6 kg 135.6 kg    Examination:  General exam: Appears calm and comfortable  Respiratory system: Clear to auscultation. Respiratory effort normal. Cardiovascular system: RRR Gastrointestinal system: Abdomen is nondistended, soft and nontender Central nervous system: Alert and oriented. No focal neurological deficits. Extremities: LLE with healing surgical wounds, anasarca Skin: No rashes, lesions or ulcers Psychiatry: Judgement and insight appear normal. Mood & affect appropriate.     Data Reviewed: I have personally reviewed following labs and imaging studies  CBC: Recent Labs  Lab 07/10/21 0350 07/10/21 1255 07/10/21 2112  07/11/21 0350 07/12/21 0622 07/13/21 0934 07/14/21 0446  WBC 8.8 7.4  --  6.5 5.6 5.7 6.3  NEUTROABS 4.6 4.0  --   --   --   --   --   HGB 10.0* 9.1* 8.5* 7.1* 7.6* 8.1* 7.4*  HCT 32.0* 28.5* 26.1* 22.3* 23.6* 26.4* 23.8*  MCV 95.8 93.8  --  94.1 94.8 97.1 96.7  PLT PLATELET CLUMPS NOTED ON SMEAR, UNABLE TO ESTIMATE 108*  --  83* 95* 102* 91*    Basic Metabolic Panel: Recent Labs  Lab 07/10/21 0350 07/11/21 0350 07/12/21 0446 07/13/21 0912 07/14/21 0446  NA 140 139 139 140 142  K 4.2 3.8 3.8 5.4* 3.5  CL 109 111 112* 116* 114*  CO2 21* 20* 20* 18* 21*  GLUCOSE 88 75 85 95 92  BUN 110* 114* 97* 91* 86*  CREATININE 1.17* 1.28* 1.36* 1.08* 1.09*  CALCIUM 8.6* 7.9* 7.9* 7.7* 7.9*    GFR: Estimated Creatinine Clearance: 63.2 mL/min ([facility]) (by C-G formula based on SCr of 1.09 mg/dL (H)).  Liver Function Tests: Recent Labs  Lab 07/10/21 0350 07/11/21 0350  AST 16 16  ALT 19 15  ALKPHOS 319* 261*  BILITOT 1.2 1.1  PROT 5.5* 4.6*  ALBUMIN 2.2* 2.0*    CBG: No results for input(s): GLUCAP in the last 168 hours.   Recent Results (from the past 240 hour(s))  Blood Culture (routine x 2)     Status: Abnormal   Collection Time: 07/10/21  3:50 AM   Specimen: BLOOD  Result Value Ref Range Status   Specimen Description   Final    BLOOD LEFT ANTECUBITAL Performed at [facility] [address], [place], [place] [address]    Special Requests   Final    BOTTLES DRAWN AEROBIC AND ANAEROBIC Blood Culture adequate volume Performed at [facility] [address], [place], [place] [address]    Culture  Setup Time   Final    GRAM POSITIVE COCCI IN CLUSTERS IN BOTH AEROBIC AND ANAEROBIC BOTTLES CRITICAL RESULT CALLED TO, READ BACK BY AND VERIFIED WITH: J. LEGGE 07/11/21 1154 FH    Culture ([facility])  Final    STAPHYLOCOCCUS EPIDERMIDIS THE SIGNIFICANCE OF ISOLATING THIS ORGANISM FROM [facility] SINGLE SET OF BLOOD CULTURES WHEN MULTIPLE SETS ARE DRAWN  IS UNCERTAIN. PLEASE NOTIFY THE MICROBIOLOGY DEPARTMENT WITHIN ONE WEEK IF SPECIATION AND SENSITIVITIES ARE REQUIRED. Performed at [facility], [place] [address], [place], [place] [address]    Report Status 07/13/2021 FINAL  Final  Blood Culture ID Panel (Reflexed)     Status: Abnormal   Collection Time: 07/10/21  3:50 AM  Result Value Ref Range Status   Enterococcus faecalis NOT DETECTED NOT DETECTED Final   Enterococcus Faecium  NOT DETECTED NOT DETECTED Final   Listeria monocytogenes NOT DETECTED NOT DETECTED Final   Staphylococcus species DETECTED ([facility]) NOT DETECTED Final    Comment: CRITICAL RESULT CALLED TO, READ BACK BY AND VERIFIED WITH: J. LEGGE 07/11/21 1154 FH    Staphylococcus aureus (BCID) NOT DETECTED NOT DETECTED Final   Staphylococcus epidermidis DETECTED ([facility]) NOT DETECTED Final    Comment: Methicillin (oxacillin) resistant coagulase negative staphylococcus. Possible blood culture contaminant (unless isolated from more than one blood culture draw or clinical case suggests pathogenicity). No antibiotic treatment is indicated for blood  culture contaminants. CRITICAL RESULT CALLED TO, READ BACK BY AND VERIFIED WITH: J. LEGGE 07/11/21 1154 FH    Staphylococcus lugdunensis NOT DETECTED NOT DETECTED Final   Streptococcus species NOT DETECTED NOT DETECTED Final   Streptococcus agalactiae NOT DETECTED NOT DETECTED Final   Streptococcus pneumoniae NOT DETECTED NOT DETECTED Final   Streptococcus pyogenes NOT DETECTED NOT DETECTED Final   Hue Frick.calcoaceticus-baumannii NOT DETECTED NOT DETECTED Final   Bacteroides fragilis NOT DETECTED NOT DETECTED Final   Enterobacterales NOT DETECTED NOT DETECTED Final   Enterobacter cloacae complex NOT DETECTED NOT DETECTED Final   Escherichia coli NOT DETECTED NOT DETECTED Final   Klebsiella aerogenes NOT DETECTED NOT DETECTED Final   Klebsiella oxytoca NOT DETECTED NOT DETECTED Final   Klebsiella pneumoniae NOT DETECTED NOT DETECTED Final    Proteus species NOT DETECTED NOT DETECTED Final   Salmonella species NOT DETECTED NOT DETECTED Final   Serratia marcescens NOT DETECTED NOT DETECTED Final   Haemophilus influenzae NOT DETECTED NOT DETECTED Final   Neisseria meningitidis NOT DETECTED NOT DETECTED Final   Pseudomonas aeruginosa NOT DETECTED NOT DETECTED Final   Stenotrophomonas maltophilia NOT DETECTED NOT DETECTED Final   Candida albicans NOT DETECTED NOT DETECTED Final   Candida auris NOT DETECTED NOT DETECTED Final   Candida glabrata NOT DETECTED NOT DETECTED Final   Candida krusei NOT DETECTED NOT DETECTED Final   Candida parapsilosis NOT DETECTED NOT DETECTED Final   Candida tropicalis NOT DETECTED NOT DETECTED Final   Cryptococcus neoformans/gattii NOT DETECTED NOT DETECTED Final   Methicillin resistance mecA/C DETECTED (Markell Sciascia) NOT DETECTED Final    Comment: CRITICAL RESULT CALLED TO, READ BACK BY AND VERIFIED WITH: J. LEGGE 07/11/21 1154 FH Performed at [facility], [address] [place], [place] [address]   Urine Culture     Status: None   Collection Time: 07/10/21  9:15 AM   Specimen: In/Out Cath Urine  Result Value Ref Range Status   Specimen Description   Final    IN/OUT CATH URINE Performed at [facility] [address], [place], [place] [address]    Special Requests   Final    NONE Performed at [facility], [place] [address], [place], [place] [address]    Culture   Final    NO GROWTH Performed at [facility], [place] [address], [place], [place] [address]    Report Status 07/11/2021 FINAL  Final  Blood Culture (routine x 2)     Status: None (Preliminary result)   Collection Time: 07/10/21  7:09 PM   Specimen: BLOOD  Result Value Ref Range Status   Specimen Description   Final    BLOOD SITE NOT SPECIFIED Performed at [facility] [address] [place], [place] [address]    Special Requests   Final    BOTTLES DRAWN AEROBIC ONLY  Blood Culture results may not be optimal due to  an inadequate volume of blood received in culture bottles Performed at [facility] [address], [place], [place] [address]    Culture   Final    NO GROWTH 4 DAYS Performed at [facility], [place] [address] [place], [place] [address]    Report Status PENDING  Incomplete         Radiology Studies: No results found.      Scheduled Meds:  (feeding supplement) PROSource Plus  30 mL Oral BID BM   ascorbic acid  500 mg Oral BID   Chlorhexidine Gluconate Cloth  6 each Topical Daily   cholecalciferol  5,000 Units Oral Daily   [START ON 07/15/2021] cosyntropin  0.25 mg Intravenous Once   feeding supplement  237 mL Oral BID BM   folic acid  1 mg Oral Daily   lactobacillus acidophilus & bulgar  2 tablet Oral TID   levothyroxine  125 mcg Oral Q0600   mirtazapine  15 mg Oral QHS   multivitamin with minerals  1 tablet Oral Daily   pantoprazole  40 mg Oral BID AC   sucralfate  1 g Oral TID WC & HS   zinc sulfate  220 mg Oral Daily   Continuous Infusions:   LOS: 3 days    Time spent: over 30 min    Fayrene Helper, MD [facility]   To contact the attending provider between 7A-7P or the covering provider during after hours 7P-7A, please log into the web site [email or web address] and access using universal [place] password for that web site. If you do not have the password, please call the hospital operator.  07/14/2021, 2:45 PM

## 2021-07-14 NOTE — Assessment & Plan Note (Addendum)
Imaging showed fatty liver -> her constellation of findings make me concerned/suspicious regarding her synthetic liver function and presence of cirrhosis?  This may be most likely unifying diagnosis, though this is not definitively diagnosed. High INR despite vit K, thrombocytopenia, hypoalbuminemia, mildly elevated bilirubin Appreciate GI recommendations

## 2021-07-14 NOTE — Assessment & Plan Note (Addendum)
Chronic and stable. Likely related to underlying liver disease.

## 2021-07-14 NOTE — Assessment & Plan Note (Addendum)
Hb improved S/p 2 units No bleeding noted, follow

## 2021-07-14 NOTE — Hospital Course (Addendum)
76 yo with hx atrial fibrillation, hypothyroidism, chronic thrombocytopenia, HFpEF, HLD, HTN, depression and multiple other medical issues who presented with weakness and lethargy.  She's had poor PO intake.  Of note, she fractured her ankle in October 2022 and has been living at [facility] since then.  She was admitted with dehydration, hypotension, and AKI on 12/12 and returned on 12/28 with similar issues.  She was seen by GI on 12/30 for upper endoscopy which showed Jhonny Calixto nonbleeding duodenal ulcer, erythematous mucosa in the gastric body and erythematous duodenopathy.  Labs for H pylori pending and celiac labs are negative.  GI was consulted.  Heme consulted  due to persistently elevated INR.  Palliative was consulted as well.  She remains anasarcic with poor appetite.  Nephrology and cardiology saw her for her anasarca.  She continued to decline during her admission with waxing and waning mental status as well as poor PO intake.  Decision was made ultimately for comfort measures.  See below and prior notes for additional details

## 2021-07-14 NOTE — Assessment & Plan Note (Addendum)
Now comfort measures Chronic. Likely related to chronic hypoalbuminemia.  Foley catheter placed on 07/11/2021 and removed on 07/19/2021. Echo with normal EF, grade I diastolic dysfunction Urine without notable proteinuria (UP/C 0.17) With hypotension now as well Follow urine sodium as above Nephrology c/s, appreciate recs - holding diuresis, midodrine, appreciate assistance Cardiology c/s - noted edema not cardiac, recommended enteral feeding or TPN

## 2021-07-14 NOTE — Assessment & Plan Note (Addendum)
Comfort measures as noted above With elevated ammonia, concern for hepatic encephalopathy [place] ascites search -> small to moderate volume ascites  TSH of 8.6 with normal free T4. Vitamin B12 and folate are within normal limits.  High dose thiamine Cortisol normal with [facility] normal ACTH stim test.  Psychiatry evaluated and recommended transition from Lexapro to Remeron.  MRI brain without acute process. Chronic ischemic changes noted. Discussed with neurology and findings do not explain patient presentation; dural thickening acknowledged but recommendation for no further workup per neurology unless neurologic symptoms arose.

## 2021-07-14 NOTE — Assessment & Plan Note (Addendum)
Pressure Injury 06/25/21 Coccyx Mid;Lower Stage 3 -  Full thickness tissue loss. Subcutaneous fat may be visible but bone, tendon or muscle are NOT exposed. 1.5 cm by 1 cm (Active)  06/25/21 0130  Location: Coccyx  Location Orientation: Mid;Lower  Staging: Stage 3 -  Full thickness tissue loss. Subcutaneous fat may be visible but bone, tendon or muscle are NOT exposed.  Wound Description (Comments): 1.5 cm by 1 cm  Present on Admission: Yes     Pressure Injury Pretibial Distal;Left Unstageable - Full thickness tissue loss in which the base of the injury is covered by slough (yellow, tan, gray, green or brown) and/or eschar (tan, brown or black) in the wound bed. 2 cm x 1 cm (Active)     Location: Pretibial  Location Orientation: Distal;Left  Staging: Unstageable - Full thickness tissue loss in which the base of the injury is covered by slough (yellow, tan, gray, green or brown) and/or eschar (tan, brown or black) in the wound bed.  Wound Description (Comments): 2 cm x 1 cm  Present on Admission: Yes     Pressure Injury 07/10/21 Groin Medial Stage 1 -  Intact skin with non-blanchable redness of Luz Mares localized area usually over Shakura Cowing bony prominence. medical equipment PI: lines of nonblanchable redness where purewick tubing might have been (Active)  07/10/21 1906  Location: Groin  Location Orientation: Medial  Staging: Stage 1 -  Intact skin with non-blanchable redness of Jaimen Melone localized area usually over Kabir Brannock bony prominence.  Wound Description (Comments): medical equipment PI: lines of nonblanchable redness where purewick tubing might have been  Present on Admission: Yes   Appreciate wound care recs - I think some of this may have been related to initial foley placement given appearance, will continue to monitor closely

## 2021-07-14 NOTE — Assessment & Plan Note (Addendum)
1 of 2 from 12/28 with staph epidermidis. Likely contaminant.

## 2021-07-14 NOTE — Assessment & Plan Note (Addendum)
Possibly secondary to poor nutrition vs liver disease. Stable.

## 2021-07-14 NOTE — Assessment & Plan Note (Addendum)
Left ankle. History of ORIF on recent admission on 05/07/21. Max weight bearing of 75 lbs in boot for one month.

## 2021-07-14 NOTE — Assessment & Plan Note (Addendum)
Baseline creatinine of about 1.2. °

## 2021-07-14 NOTE — Assessment & Plan Note (Addendum)
Unclear cause. Dietitian consulted. Psychiatry consulted. Patient started on Remeron to help with appetite. Per patient, she has no decrease in appetite but has particular eating habits. Patient declines enteral nutrition via feeding tube. GI consulted and patient underwent upper endoscopy with erythematous duodenopathy with associated non-bleeding duodenal ulcer with clean base. -Continue Remeron - She's continuing to decline tube feeding.

## 2021-07-14 NOTE — Assessment & Plan Note (Addendum)
EGD with erythematous mucosa in gastric body, erythematous duodenopathy, nonbleeding duodenal ulcer with clean base. H. Pylori and celiac serology negative (negative TTG, reticulin ab, and gliadin ab). GI recommending Protonix 40 mg BID x1 month and sucralfate suspension 1 g PO QID x2 weeks.  -Re-order h pylori stool ab (negative)

## 2021-07-14 NOTE — Consult Note (Signed)
Face To Face Psychiatry Consult  Patient Identification:  Sally Wise Date of Evaluation:  07/14/2021 Referring Provider:Dr Rizwan   07/14/2021: Patient seen and chart reviewed.  She is taking mirtazapine 50 mg at bedtime which was switched from Lexapro 5 mg.  Patient reported she had a good night sleep.  As per staff she has insurance and she is also drinking.  Patient admitted that she is sometimes under a lot of pressure from her husband to eat more and that makes her more frustrated and anxious.  She admitted having a lot of anxiety and depression because of chronic health issues.  She reported symptoms started to get worse after she had fracture ankle.  Patient endorsed sometimes she does not have desire to eat but otherwise she feels fine.  She admitted due to stress from her husband to eat more makes her sad and sometimes she has crying spells.  She admitted concerned and scared about her chronic health issues and wondering if she ever able to get better from her medical problems.  She denies any suicidal thoughts or homicidal thought.  She denies any panic attack.  Patient has taken a few bites and drink Ensure since yesterday.  As per staff she is not a management problem and remain, cooperative and not disruptive.  Patient is also seen by GI for poor oral intake.  History of Present Illness: Sally Wise is a 76 year old Caucasian, married female with history of A. fib, hypothyroidism, chronic thrombocytopenia, hypertension, depression admitted with weakness and lethargy.  Patient has fractured her ankle in October 22 and since then she is in SNF.  Patient has unable to walk.  Psychiatry consult was called because patient not eating and history of anxiety.  Patient seen and chart reviewed.  I also spoke to her husband who was present at the bedside.  Patient reported started to have anxiety few months ago her lack of appetite is chronic.  Though she do not notice any significant weight loss but had  a extensive work-up by GI.  Patient was given antianxiety medicine by her PCP Dr. Harrington Challenger as she noticed lately feeling very anxious when she goes out of the house.  She is not sure why she has no appetite but denies any crying spells, paranoia, suicidal thoughts, anhedonia or any feeling of hopelessness.  Her husband and does the patient has no previous history of suicidal attempt, inpatient psychiatric treatment.  Though she had depression but she believes it is triggered by too many health issues and since she fractured her ankle.  Patient endorses being in the hospital making her more anxious and sometimes she feels tired and weak.  She denies any hallucination or any mood swings.  In the hospital she is calm and cooperative and there were no outburst behavior or agitation.  Her current medicine is Lexapro 5 mg.  She is not sure if it was given by her PCP.  Patient lives with her husband.  She has 2 daughter who lives in town and she is in frequent contact with them.  Past Psychiatric History:  Patient denies any history of previous suicidal attempt, psychiatric inpatient treatment.  She dont recall seeing a psychiatrist.  Her PCP has prescribed anxiety medicine but she do not recall the details.  Patient denies any history of mania, psychosis, abuse.   Past Medical History:     Past Medical History:  Diagnosis Date   Hematuria    evaluation with Dr. Joelyn Oms   Hypothyroidism 06/13/2010  Kidney stone [id number]   Lichen sclerosus et atrophicus 08/15/2011   biopsy proven   LVH (left ventricular hypertrophy)    Morbid obesity (HCC)    PAF (paroxysmal atrial fibrillation) ([place])    a. Remote hx, re-established care 01/2013 with Dr. Johnsie Cancel for recurrence. 2D echo 01/2013: mod LVH, EF 55-65%, no RWMA, grade 1 d/d. b. 06/2014: started on Xarelto.    Thrombocytopenia ([place])    Vitamin D deficiency 06/13/2010   Wears glasses        Past Surgical History:  Procedure Laterality Date   BREAST EXCISIONAL  BIOPSY  2010   left--was a vascular lesion   CARPAL TUNNEL RELEASE Left 08/09/2013   Procedure: LEFT CARPAL TUNNEL RELEASE;  Surgeon: Cammie Sickle., MD;  Location: [facility];  Service: Orthopedics;  Laterality: Left;   CATARACT EXTRACTION     COLONOSCOPY  04/2003       DILATION AND CURETTAGE OF UTERUS     HYSTEROSCOPY WITH D & C  8/99   ORIF ANKLE FRACTURE Left 05/07/2021   Procedure: OPEN REDUCTION INTERNAL FIXATION (ORIF) ANKLE FRACTURE;  Surgeon: Wylene Simmer, MD;  Location: [facility];  Service: Orthopedics;  Laterality: Left;   REPLACEMENT TOTAL KNEE Bilateral 12/2006  07/2007    TONSILLECTOMY     TRIGGER FINGER RELEASE Left 08/09/2013   Procedure: LEFT A1/A2 PULLEY RELEASE;  Surgeon: Cammie Sickle., MD;  Location: [facility];  Service: Orthopedics;  Laterality: Left;   TUBAL LIGATION  5/77       Allergies: No Known Allergies  Current Medications:  Prior to Admission medications   Medication Sig Start Date End Date Taking? Authorizing Provider  ascorbic acid (VITAMIN C) 500 MG tablet Take 1 tablet (500 mg total) by mouth 2 (two) times daily. 06/28/21  Yes Lavina Hamman, MD  bisacodyl 5 MG EC tablet Take 5 mg by mouth daily as needed for moderate constipation.   Yes [provider]  Cholecalciferol (VITAMIN D3) 5000 UNITS TABS Take 1 tablet by mouth daily.   Yes [provider]  escitalopram (LEXAPRO) 5 MG tablet Take 5 mg by mouth at bedtime. 07/02/21  Yes [provider]  ferrous sulfate 325 (65 FE) MG tablet Take 325 mg by mouth daily with breakfast.   Yes [provider]  fludrocortisone (FLORINEF) 0.1 MG tablet Take 100 mcg by mouth daily. 07/08/21  Yes [provider]  folic acid (FOLVITE) 1 MG tablet Take 1 tablet (1 mg total) by mouth daily. 03/17/21  Yes Mercy Riding, MD  HYDROcodone-acetaminophen (NORCO/VICODIN) 5-325 MG tablet Take 1 tablet by mouth every 6 (six) hours as needed for moderate  pain or severe pain. 06/28/21  Yes Lavina Hamman, MD  lactobacillus acidophilus & bulgar (LACTINEX) chewable tablet Chew 2 tablets by mouth 3 (three) times daily. 06/28/21  Yes Lavina Hamman, MD  levothyroxine (EUTHYROX) 125 MCG tablet Take 1 tablet (125 mcg total) by mouth daily before breakfast. 06/29/21  Yes Lavina Hamman, MD  nutrition supplement, JUVEN, (JUVEN) PACK Take 1 packet by mouth 2 (two) times daily between meals. Patient taking differently: Take 1 packet by mouth 2 (two) times daily. Midday and bedtime 06/28/21  Yes Lavina Hamman, MD  Nutritional Supplements (,FEEDING SUPPLEMENT, PROSOURCE PLUS) liquid Take 30 mLs by mouth 2 (two) times daily between meals. 06/28/21  Yes Lavina Hamman, MD  ondansetron (ZOFRAN) 4 MG tablet Take 4 mg by mouth every 8 (eight) hours  as needed for nausea or vomiting.   Yes [provider]  zinc sulfate 220 (50 Zn) MG capsule Take 1 capsule (220 mg total) by mouth daily. 06/29/21  Yes Lavina Hamman, MD  feeding supplement (ENSURE ENLIVE / ENSURE PLUS) LIQD Take 237 mLs by mouth 2 (two) times daily between meals. Patient not taking: Reported on 07/10/2021 06/28/21   Lavina Hamman, MD  mirtazapine (REMERON) 15 MG tablet Take 1 tablet (15 mg total) by mouth at bedtime. Patient not taking: Reported on 07/10/2021 06/28/21   Lavina Hamman, MD  Nystatin (GERHARDT'S BUTT CREAM) CREA Apply 1 application topically 2 (two) times daily. Patient not taking: Reported on 07/10/2021 06/28/21   Lavina Hamman, MD    Social History:    reports that she has never smoked. She has never used smokeless tobacco. She reports that she does not drink alcohol and does not use drugs.   Family History:    Family History  Problem Relation Age of Onset   Hypertension Mother    Kidney disease Mother    Alzheimer's disease Father    Diabetes Brother    Hypertension Brother    Spina bifida Daughter     Psychiatric Specialty Exam: Physical  Exam Constitutional:      Appearance: She is obese.  Neurological:     Mental Status: She is alert.  Psychiatric:        Mood and Affect: Mood normal.        Behavior: Behavior normal.        Thought Content: Thought content normal.    Review of Systems  Constitutional:  Positive for appetite change.  Gastrointestinal:  Positive for nausea.   Blood pressure 114/68, pulse 84, temperature 98.8 F (37.1 C), temperature source Oral, resp. rate 18, height 5\' 8"  (1.727 m), weight 135.6 kg, last menstrual period 07/14/2001, SpO2 99 %.Body mass index is 45.46 kg/m.  General Appearance: Casual  Eye Contact:  Good  Speech:  Clear and Coherent and Normal Rate  Volume:  Decreased  Mood:  Anxious  Affect:  Congruent  Thought Process:  Goal Directed  Orientation:  Full (Time, Place, and Person)  Thought Content:  WDL  Suicidal Thoughts:  No  Homicidal Thoughts:  No  Memory:  Immediate;   Good Recent;   Fair Remote;   Fair  Judgement:  Fair  Insight:  Fair  Psychomotor Activity:  Decreased  Concentration:  Concentration: Fair and Attention Span: Fair  Recall:  [employer] of Knowledge:  Fair  Language:  Good  Akathisia:  No  Handed:  Right  AIMS (if indicated):     Assets:  Communication Skills Desire for Improvement Housing Social Support  ADL's:  Intact  Cognition:  WNL  Sleep:   ok         Assessment: Patient is 76 year old female Genesis 76 year old with history of A. fib, hyper thyroidism, chronic thrombocytopenia, anxiety presented with weakness and lethargy and poor appetite.    Recommendations; Patient now taking mirtazapine 15 mg daily.  So far she is tolerating well and reported no side effects.  I discussed mirtazapine can help her anxiety depression and sleep and increase her appetite and she is willing to try and open to increase the dose of the medication if needed in the future. Patient does not meet criteria for inpatient psychiatric services. Psychiatry  consultation services will continue to monitor the patient and medication adjustment if needed   Berniece Andreas MD Psychiatry

## 2021-07-15 DIAGNOSIS — R601 Generalized edema: Secondary | ICD-10-CM | POA: Diagnosis not present

## 2021-07-15 LAB — CULTURE, BLOOD (ROUTINE X 2): Culture: NO GROWTH

## 2021-07-15 MED ORDER — COSYNTROPIN 0.25 MG IJ SOLR
0.2500 mg | Freq: Once | INTRAMUSCULAR | Status: DC
  Filled 2021-07-15: qty 0.25

## 2021-07-15 MED ORDER — COSYNTROPIN 0.25 MG IJ SOLR
0.2500 mg | Freq: Once | INTRAMUSCULAR | Status: AC
  Administered 2021-07-16: 0.25 mg via INTRAVENOUS
  Filled 2021-07-15: qty 0.25

## 2021-07-15 MED ORDER — LACTULOSE 10 GM/15ML PO SOLN
10.0000 g | Freq: Two times a day (BID) | ORAL | Status: DC
  Administered 2021-07-15 – 2021-07-24 (×9): 10 g via ORAL
  Filled 2021-07-15 (×18): qty 15

## 2021-07-15 NOTE — Consult Note (Signed)
Face To Face Psychiatry Consult  Patient Identification:  Sally Wise Date of Evaluation:  07/15/2021 Referring Provider:Dr Rizwan    Patient seen and reassessed today, case discussed with Dr. Lovette Cliche.  Patient reports compliance in current tolerance for mirtazapine 15 mg p.o. nightly.  She denies any current side effects, adverse reactions.  She has not noticed an increase in appetite, however has continued to take a few bites of her food " which is more than I have been doing."  She continues to endorse some depression, and anxiety.  We reviewed course of action with mirtazapine to take several weeks to months, before being completely therapeutic and effective.  Patient is anticipating upcoming discharge, is open to outpatient psychiatric services although it is felt these services are offered at her nursing facility.  She continues to deny any suicidal thoughts, homicidal thoughts, and or auditory or visual hallucinations.   07/14/2021: Patient seen and chart reviewed.  She is taking mirtazapine 50 mg at bedtime which was switched from Lexapro 5 mg.  Patient reported she had a good night sleep.  As per staff she has insurance and she is also drinking.  Patient admitted that she is sometimes under a lot of pressure from her husband to eat more and that makes her more frustrated and anxious.  She admitted having a lot of anxiety and depression because of chronic health issues.  She reported symptoms started to get worse after she had fracture ankle.  Patient endorsed sometimes she does not have desire to eat but otherwise she feels fine.  She admitted due to stress from her husband to eat more makes her sad and sometimes she has crying spells.  She admitted concerned and scared about her chronic health issues and wondering if she ever able to get better from her medical problems.  She denies any suicidal thoughts or homicidal thought.  She denies any panic attack.  Patient has taken a few bites and  drink Ensure since yesterday.  As per staff she is not a management problem and remain, cooperative and not disruptive.  Patient is also seen by GI for poor oral intake.  History of Present Illness: Sally Wise is a 76 year old Caucasian, married female with history of A. fib, hypothyroidism, chronic thrombocytopenia, hypertension, depression admitted with weakness and lethargy.  Patient has fractured her ankle in October 22 and since then she is in SNF.  Patient has unable to walk.  Psychiatry consult was called because patient not eating and history of anxiety.   Past Psychiatric History:  Patient denies any history of previous suicidal attempt, psychiatric inpatient treatment.  She dont recall seeing a psychiatrist.  Her PCP has prescribed anxiety medicine but she do not recall the details.  Patient denies any history of mania, psychosis, abuse.   Past Medical History:     Past Medical History:  Diagnosis Date   Hematuria    evaluation with Dr. Joelyn Oms   Hypothyroidism 06/13/2010   Kidney stone [id number]   Lichen sclerosus et atrophicus 08/15/2011   biopsy proven   LVH (left ventricular hypertrophy)    Morbid obesity (HCC)    PAF (paroxysmal atrial fibrillation) ([place])    a. Remote hx, re-established care 01/2013 with Dr. Johnsie Cancel for recurrence. 2D echo 01/2013: mod LVH, EF 55-65%, no RWMA, grade 1 d/d. b. 06/2014: started on Xarelto.    Thrombocytopenia ([place])    Vitamin D deficiency 06/13/2010   Wears glasses        Past Surgical History:  Procedure Laterality Date   BREAST EXCISIONAL BIOPSY  2010   left--was a vascular lesion   CARPAL TUNNEL RELEASE Left 08/09/2013   Procedure: LEFT CARPAL TUNNEL RELEASE;  Surgeon: Cammie Sickle., MD;  Location: [facility];  Service: Orthopedics;  Laterality: Left;   CATARACT EXTRACTION     COLONOSCOPY  04/2003       DILATION AND CURETTAGE OF UTERUS     HYSTEROSCOPY WITH D & C  8/99   ORIF ANKLE FRACTURE Left 05/07/2021    Procedure: OPEN REDUCTION INTERNAL FIXATION (ORIF) ANKLE FRACTURE;  Surgeon: Wylene Simmer, MD;  [facility];  Service: Orthopedics;  Laterality: Left;   REPLACEMENT TOTAL KNEE Bilateral 12/2006  07/2007    TONSILLECTOMY     TRIGGER FINGER RELEASE Left 08/09/2013   Procedure: LEFT A1/A2 PULLEY RELEASE;  Surgeon: Cammie Sickle., MD;  Location: [facility];  Service: Orthopedics;  Laterality: Left;   TUBAL LIGATION  5/77       Allergies: No Known Allergies  Current Medications:  Prior to Admission medications   Medication Sig Start Date End Date Taking? Authorizing Provider  ascorbic acid (VITAMIN C) 500 MG tablet Take 1 tablet (500 mg total) by mouth 2 (two) times daily. 06/28/21  Yes Lavina Hamman, MD  bisacodyl 5 MG EC tablet Take 5 mg by mouth daily as needed for moderate constipation.   Yes [provider]  Cholecalciferol (VITAMIN D3) 5000 UNITS TABS Take 1 tablet by mouth daily.   Yes [provider]  escitalopram (LEXAPRO) 5 MG tablet Take 5 mg by mouth at bedtime. 07/02/21  Yes [provider]  ferrous sulfate 325 (65 FE) MG tablet Take 325 mg by mouth daily with breakfast.   Yes [provider]  fludrocortisone (FLORINEF) 0.1 MG tablet Take 100 mcg by mouth daily. 07/08/21  Yes [provider]  folic acid (FOLVITE) 1 MG tablet Take 1 tablet (1 mg total) by mouth daily. 03/17/21  Yes Mercy Riding, MD  HYDROcodone-acetaminophen (NORCO/VICODIN) 5-325 MG tablet Take 1 tablet by mouth every 6 (six) hours as needed for moderate pain or severe pain. 06/28/21  Yes Lavina Hamman, MD  lactobacillus acidophilus & bulgar (LACTINEX) chewable tablet Chew 2 tablets by mouth 3 (three) times daily. 06/28/21  Yes Lavina Hamman, MD  levothyroxine (EUTHYROX) 125 MCG tablet Take 1 tablet (125 mcg total) by mouth daily before breakfast. 06/29/21  Yes Lavina Hamman, MD  nutrition supplement, JUVEN, (JUVEN) PACK Take 1 packet by mouth 2  (two) times daily between meals. Patient taking differently: Take 1 packet by mouth 2 (two) times daily. Midday and bedtime 06/28/21  Yes Lavina Hamman, MD  Nutritional Supplements (,FEEDING SUPPLEMENT, PROSOURCE PLUS) liquid Take 30 mLs by mouth 2 (two) times daily between meals. 06/28/21  Yes Lavina Hamman, MD  ondansetron (ZOFRAN) 4 MG tablet Take 4 mg by mouth every 8 (eight) hours as needed for nausea or vomiting.   Yes [provider]  zinc sulfate 220 (50 Zn) MG capsule Take 1 capsule (220 mg total) by mouth daily. 06/29/21  Yes Lavina Hamman, MD  feeding supplement (ENSURE ENLIVE / ENSURE PLUS) LIQD Take 237 mLs by mouth 2 (two) times daily between meals. Patient not taking: Reported on 07/10/2021 06/28/21   Lavina Hamman, MD  mirtazapine (REMERON) 15 MG tablet Take 1 tablet (15 mg total) by mouth at bedtime. Patient not taking: Reported on 07/10/2021 06/28/21  Lavina Hamman, MD  Nystatin (GERHARDT'S BUTT CREAM) CREA Apply 1 application topically 2 (two) times daily. Patient not taking: Reported on 07/10/2021 06/28/21   Lavina Hamman, MD    Social History:    reports that she has never smoked. She has never used smokeless tobacco. She reports that she does not drink alcohol and does not use drugs.   Family History:    Family History  Problem Relation Age of Onset   Hypertension Mother    Kidney disease Mother    Alzheimer's disease Father    Diabetes Brother    Hypertension Brother    Spina bifida Daughter     Psychiatric Specialty Exam: Physical Exam Constitutional:      Appearance: Normal appearance. She is normal weight.  Neurological:     Mental Status: She is alert.  Psychiatric:        Mood and Affect: Mood normal.        Behavior: Behavior normal.        Thought Content: Thought content normal.    Review of Systems  Constitutional:  Positive for activity change and appetite change.  Gastrointestinal:  Negative for nausea.   Psychiatric/Behavioral:  Positive for dysphoric mood. The patient is nervous/anxious.   All other systems reviewed and are negative.  Blood pressure (!) 102/46, pulse 86, temperature 98.7 F (37.1 C), temperature source Oral, resp. rate 16, height 5\' 8"  (1.727 m), weight 135.6 kg, last menstrual period 07/14/2001, SpO2 100 %.Body mass index is 45.46 kg/m.  General Appearance: Casual  Eye Contact:  Good  Speech:  Clear and Coherent and Normal Rate  Volume:  Decreased  Mood:  Anxious  Affect:  Congruent  Thought Process:  Goal Directed  Orientation:  Full (Time, Place, and Person)  Thought Content:  WDL  Suicidal Thoughts:  No  Homicidal Thoughts:  No  Memory:  Immediate;   Good Recent;   Fair Remote;   Fair  Judgement:  Fair  Insight:  Fair  Psychomotor Activity:  Decreased  Concentration:  Concentration: Fair and Attention Span: Fair  Recall:  [employer] of Knowledge:  Fair  Language:  Good  Akathisia:  No  Handed:  Right  AIMS (if indicated):     Assets:  Communication Skills Desire for Improvement Housing Social Support  ADL's:  Intact  Cognition:  WNL  Sleep:   ok     Assessment: Patient is 76 year old female Sally Wise 76 year old with history of A. fib, hyper thyroidism, chronic thrombocytopenia, anxiety presented with weakness and lethargy and poor appetite.    Recommendations: Continue current medication.  Patient is currently taking mirtazapine 15 mg p.o. nightly.  Will encourage patient to continue taking her medications at her nursing facility.  She is also open to outpatient psychiatric services, will place referral for [facility] to coordinate care for outpatient behavioral health.  Some services are available via zoom, which will accommodate patient at nursing facility, also inquire if facility has psychiatric and behavioral health services on campus.  Psychiatry will sign off at this time.  -TOC consult for outpatient psychiatric services. -Psychiatry to sign off at  this time.

## 2021-07-15 NOTE — Progress Notes (Signed)
PROGRESS NOTE    Sally Wise  [id number] DOB: November 11, 1945 DOA: 07/10/2021 PCP: [name], MD  Chief Complaint  Patient presents with   Weakness    Brief Narrative:  76 yo with hx atrial fibrillation, hypothyroidism, chronic thrombocytopenia, HFpEF, HLD, HTN, depression and multiple other medical issues who presented with weakness and lethargy.  She's had poor PO intake.  SOf note, she fractured her ankle in October 2022 and has been living at [name] since then.  She was admitted with dehydration, hypotension, and AKI on 12/12 and returned on 12/28 with similar issues.      Assessment & Plan:   Principal Problem:   Anasarca Active Problems:   Acute metabolic encephalopathy   Lack of appetite   PUD (peptic ulcer disease)   Chronic kidney disease with active medical management without dialysis, stage 3 (moderate) (HCC)   Elevated INR   Thrombocytopenia (HCC)   Fatty liver disease, nonalcoholic   Anemia   Ankle fracture   Hypoalbuminemia   Pressure ulcer   Contamination of blood culture   Morbid obesity (HCC)   PAF (paroxysmal atrial fibrillation) (HCC)   * Anasarca- (present on admission) At presentation thought to be intravascularly depleted S/p IVF with albumin Follow UOP - has foley for I/O given incontinence, too swollen to manage purewick Strict I/O, daily weights UA without notable proteinuria, UP/C 0.17 Echo with EF [id number], grade 1 diastolic dysfunction, normal RVSF, small pericardial effusion (see report) Continue to monitor   Lack of appetite Unclear cause GI c/s -> s/p EGD with normal esophagus, erythematous mucosa in gastric body, erythematous duodenopathy, nonbleeding duodenal ulcer with clean base Follow H pylori ag and celiac serology PPI BID x1 month, sucralfate 1 g QID x2 weeks Negative TTG, pending reticulin ab Pending H pylori stool ab, pending gliadin ab  Acute metabolic encephalopathy Weakness, lethargy Mild at this time TSH  elevated, normal free t4 b12 elevated, folate wnl Elevated ammonia, no asterixis on exam, but will try lactulose to see if this helps with her symptoms Cortisol low normal, follow acth stim test Psychiatry who recommended transition from lexapro to remeron (12/31), they've now signed off 07/15/21   PUD (peptic ulcer disease) EGD with erythematous mucosa in gastric body, erythematous duodenopathy, nonbleeding duodenal ulcer with clean base Pending h pylori stool ab, celiac serology PPI, sucralfate  Elevated INR Unclear etiology Elevated despite vitamin K administration Discussed with heme today, Dr. Alen Blew will see tomorrow - will look into whether or not she could have received doac at facility?  Chronic kidney disease with active medical management without dialysis, stage 3 (moderate) ([place])- (present on admission) Creatinine appears close to baseline, follow  Fatty liver disease, nonalcoholic Consider additional liver imaging  Thrombocytopenia ([place])- (present on admission) chronic  Anemia- (present on admission) Fluctuating, trend C/w AOCD - b12/folate wnl  Ankle fracture- (present on admission) S/p Open treatment left ankle trimalleolar fracture with internal fixation including fixation of the posterior lip 05/07/2021  she's in Mahathi Pokorney cam boot which can be removed for ROM in PT.  She can bear weight to Saniyya Gau max of 75lbs in the boot for the next month.  Hypoalbuminemia- (present on admission) Due to poor nutrition?  Pressure ulcer Pressure Injury 06/25/21 Coccyx Mid;Lower Stage 3 -  Full thickness tissue loss. Subcutaneous fat may be visible but bone, tendon or muscle are NOT exposed. 1.5 cm by 1 cm (Active)  06/25/21 0130  Location: Coccyx  Location Orientation: Mid;Lower  Staging: Stage 3 -  Full thickness tissue loss. Subcutaneous fat may be visible but bone, tendon or muscle are NOT exposed.  Wound Description (Comments): 1.5 cm by 1 cm  Present on Admission: Yes      Pressure Injury Pretibial Distal;Left Unstageable - Full thickness tissue loss in which the base of the injury is covered by slough (yellow, tan, gray, green or brown) and/or eschar (tan, brown or black) in the wound bed. 2 cm x 1 cm (Active)     Location: Pretibial  Location Orientation: Distal;Left  Staging: Unstageable - Full thickness tissue loss in which the base of the injury is covered by slough (yellow, tan, gray, green or brown) and/or eschar (tan, brown or black) in the wound bed.  Wound Description (Comments): 2 cm x 1 cm  Present on Admission: Yes     Pressure Injury 07/10/21 Groin Medial Stage 1 -  Intact skin with non-blanchable redness of [name] localized area usually over [name] bony prominence. medical equipment PI: lines of nonblanchable redness where purewick tubing might have been (Active)  07/10/21 1906  Location: Groin  Location Orientation: Medial  Staging: Stage 1 -  Intact skin with non-blanchable redness of [name] localized area usually over [name] bony prominence.  Wound Description (Comments): medical equipment PI: lines of nonblanchable redness where purewick tubing might have been  Present on Admission: Yes        Contamination of blood culture 1 of 2 from 12/28 with staph epidermidis Suspect contaminant  Difficult stick, refused labs this AM DVT prophylaxis: SCD Code Status: full Family Communication: husband Disposition:   Status is: Inpatient  Remains inpatient appropriate because: continued need for inpatient workup       Consultants:  GI pysch  Procedures:  Echo IMPRESSIONS     1. Left ventricular ejection fraction, by estimation, is 60 to 65%. The  left ventricle has normal function. The left ventricle has no regional  wall motion abnormalities. Left ventricular diastolic parameters are  consistent with Grade I diastolic  dysfunction (impaired relaxation).   2. Right ventricular systolic function is normal. The right ventricular  size is normal.  Tricuspid regurgitation signal is inadequate for assessing  PA pressure.   3. [name] small pericardial effusion is present. The pericardial effusion is  circumferential.   4. The mitral valve is grossly normal. No evidence of mitral valve  regurgitation. No evidence of mitral stenosis.   5. The aortic valve is tricuspid. Aortic valve regurgitation is not  visualized. No aortic stenosis is present.   6. The inferior vena cava is normal in size with greater than 50%  respiratory variability, suggesting right atrial pressure of 3 mmHg.   Antimicrobials:  Anti-infectives (From admission, onward)    Start     Dose/Rate Route Frequency Ordered Stop   07/11/21 0800  cefTRIAXone (ROCEPHIN) 2 g in sodium chloride 0.9 % 100 mL IVPB  Status:  Discontinued        2 g 200 mL/hr over 30 Minutes Intravenous Every 24 hours 07/10/21 1807 07/11/21 0906   07/10/21 0700  cefTRIAXone (ROCEPHIN) 2 g in sodium chloride 0.9 % 100 mL IVPB        2 g 200 mL/hr over 30 Minutes Intravenous  Once 07/10/21 0654 07/10/21 0816       Subjective: Husband at bedside  Objective: Vitals:   07/14/21 1346 07/14/21 2144 07/15/21 0516 07/15/21 1412  BP: 114/68 (!) 107/57 97/65 (!) 102/46  Pulse: 84 93 82 86  Resp: 18 16 16  16  Temp: 98.8 F (37.1 C) 98.6 F (37 C) 98.3 F (36.8 C) 98.7 F (37.1 C)  TempSrc: Oral Oral Oral Oral  SpO2: 99% 97% 99% 100%  Weight:      Height:        Intake/Output Summary (Last 24 hours) at 07/15/2021 1859 Last data filed at 07/15/2021 1700 Gross per 24 hour  Intake 843 ml  Output 1550 ml  Net -707 ml   Filed Weights   07/10/21 0338 07/11/21 0620 07/12/21 1018  Weight: (!) 144.2 kg 135.6 kg 135.6 kg    Examination:  General: No acute distress. Cardiovascular: RRR Lungs: unlabored Abdomen: Soft, nontender, nondistended  Neurological: Alert and oriented 3. Moves all extremities 4. Cranial nerves II through XII grossly intact. Skin: Warm and dry. No rashes or  lesions. Extremities: anasarca    Data Reviewed: I have personally reviewed following labs and imaging studies  CBC: Recent Labs  Lab 07/10/21 0350 07/10/21 1255 07/10/21 2112 07/11/21 0350 07/12/21 0622 07/13/21 0934 07/14/21 0446  WBC 8.8 7.4  --  6.5 5.6 5.7 6.3  NEUTROABS 4.6 4.0  --   --   --   --   --   HGB 10.0* 9.1* 8.5* 7.1* 7.6* 8.1* 7.4*  HCT 32.0* 28.5* 26.1* 22.3* 23.6* 26.4* 23.8*  MCV 95.8 93.8  --  94.1 94.8 97.1 96.7  PLT PLATELET CLUMPS NOTED ON SMEAR, UNABLE TO ESTIMATE 108*  --  83* 95* 102* 91*    Basic Metabolic Panel: Recent Labs  Lab 07/10/21 0350 07/11/21 0350 07/12/21 0446 07/13/21 0912 07/14/21 0446  NA 140 139 139 140 142  K 4.2 3.8 3.8 5.4* 3.5  CL 109 111 112* 116* 114*  CO2 21* 20* 20* 18* 21*  GLUCOSE 88 75 85 95 92  BUN 110* 114* 97* 91* 86*  CREATININE 1.17* 1.28* 1.36* 1.08* 1.09*  CALCIUM 8.6* 7.9* 7.9* 7.7* 7.9*    GFR: Estimated Creatinine Clearance: 63.2 mL/min (Aigner Horseman) (by C-G formula based on SCr of 1.09 mg/dL (H)).  Liver Function Tests: Recent Labs  Lab 07/10/21 0350 07/11/21 0350  AST 16 16  ALT 19 15  ALKPHOS 319* 261*  BILITOT 1.2 1.1  PROT 5.5* 4.6*  ALBUMIN 2.2* 2.0*    CBG: No results for input(s): GLUCAP in the last 168 hours.   Recent Results (from the past 240 hour(s))  Blood Culture (routine x 2)     Status: Abnormal   Collection Time: 07/10/21  3:50 AM   Specimen: BLOOD  Result Value Ref Range Status   Specimen Description   Final    BLOOD LEFT ANTECUBITAL Performed at [facility] [address] [place], [place] [address]    Special Requests   Final    BOTTLES DRAWN AEROBIC AND ANAEROBIC Blood Culture adequate volume Performed at [facility] [address] [place], [place] [address]    Culture  Setup Time   Final    GRAM POSITIVE COCCI IN CLUSTERS IN BOTH AEROBIC AND ANAEROBIC BOTTLES CRITICAL RESULT CALLED TO, READ BACK BY AND VERIFIED WITH: J.  LEGGE 07/11/21 1154 FH    Culture (Ehtan Delfavero)  Final    STAPHYLOCOCCUS EPIDERMIDIS THE SIGNIFICANCE OF ISOLATING THIS ORGANISM FROM Nicola Quesnell SINGLE SET OF BLOOD CULTURES WHEN MULTIPLE SETS ARE DRAWN IS UNCERTAIN. PLEASE NOTIFY THE MICROBIOLOGY DEPARTMENT WITHIN ONE WEEK IF SPECIATION AND SENSITIVITIES ARE REQUIRED. Performed at [facility], [place] [address], [place], [place] [address]    Report Status 07/13/2021  FINAL  Final  Blood Culture ID Panel (Reflexed)     Status: Abnormal   Collection Time: 07/10/21  3:50 AM  Result Value Ref Range Status   Enterococcus faecalis NOT DETECTED NOT DETECTED Final   Enterococcus Faecium NOT DETECTED NOT DETECTED Final   Listeria monocytogenes NOT DETECTED NOT DETECTED Final   Staphylococcus species DETECTED (Christyna Letendre) NOT DETECTED Final    Comment: CRITICAL RESULT CALLED TO, READ BACK BY AND VERIFIED WITH: J. LEGGE 07/11/21 1154 FH    Staphylococcus aureus (BCID) NOT DETECTED NOT DETECTED Final   Staphylococcus epidermidis DETECTED (Raynette Arras) NOT DETECTED Final    Comment: Methicillin (oxacillin) resistant coagulase negative staphylococcus. Possible blood culture contaminant (unless isolated from more than one blood culture draw or clinical case suggests pathogenicity). No antibiotic treatment is indicated for blood  culture contaminants. CRITICAL RESULT CALLED TO, READ BACK BY AND VERIFIED WITH: J. LEGGE 07/11/21 1154 FH    Staphylococcus lugdunensis NOT DETECTED NOT DETECTED Final   Streptococcus species NOT DETECTED NOT DETECTED Final   Streptococcus agalactiae NOT DETECTED NOT DETECTED Final   Streptococcus pneumoniae NOT DETECTED NOT DETECTED Final   Streptococcus pyogenes NOT DETECTED NOT DETECTED Final   Cloy Cozzens.calcoaceticus-baumannii NOT DETECTED NOT DETECTED Final   Bacteroides fragilis NOT DETECTED NOT DETECTED Final   Enterobacterales NOT DETECTED NOT DETECTED Final   Enterobacter cloacae complex NOT DETECTED NOT DETECTED Final   Escherichia coli NOT DETECTED NOT  DETECTED Final   Klebsiella aerogenes NOT DETECTED NOT DETECTED Final   Klebsiella oxytoca NOT DETECTED NOT DETECTED Final   Klebsiella pneumoniae NOT DETECTED NOT DETECTED Final   Proteus species NOT DETECTED NOT DETECTED Final   Salmonella species NOT DETECTED NOT DETECTED Final   Serratia marcescens NOT DETECTED NOT DETECTED Final   Haemophilus influenzae NOT DETECTED NOT DETECTED Final   Neisseria meningitidis NOT DETECTED NOT DETECTED Final   Pseudomonas aeruginosa NOT DETECTED NOT DETECTED Final   Stenotrophomonas maltophilia NOT DETECTED NOT DETECTED Final   Candida albicans NOT DETECTED NOT DETECTED Final   Candida auris NOT DETECTED NOT DETECTED Final   Candida glabrata NOT DETECTED NOT DETECTED Final   Candida krusei NOT DETECTED NOT DETECTED Final   Candida parapsilosis NOT DETECTED NOT DETECTED Final   Candida tropicalis NOT DETECTED NOT DETECTED Final   Cryptococcus neoformans/gattii NOT DETECTED NOT DETECTED Final   Methicillin resistance mecA/C DETECTED (Tyrone Balash) NOT DETECTED Final    Comment: CRITICAL RESULT CALLED TO, READ BACK BY AND VERIFIED WITH: J. LEGGE 07/11/21 1154 FH Performed at [facility], [address], [place], [place] [address]   Urine Culture     Status: None   Collection Time: 07/10/21  9:15 AM   Specimen: In/Out Cath Urine  Result Value Ref Range Status   Specimen Description   Final    IN/OUT CATH URINE Performed at [facility] [address], [place], [place] [address]    Special Requests   Final    NONE Performed at [facility], [place] [address], [place], [place] [address]    Culture   Final    NO GROWTH Performed at [facility], [place] [address], [place], [place] [address]    Report Status 07/11/2021 FINAL  Final  Blood Culture (routine x 2)     Status: None   Collection Time: 07/10/21  7:09 PM   Specimen: BLOOD  Result Value Ref Range Status   Specimen Description   Final    BLOOD SITE  NOT SPECIFIED Performed at [facility] [address] [place], [place] [address]    Special Requests   Final    BOTTLES DRAWN AEROBIC ONLY Blood Culture results may not be optimal due to an inadequate volume of blood received in culture bottles Performed at [facility] [address] [place], [place] [address]    Culture   Final    NO GROWTH 5 DAYS Performed at [facility], [place] [address] [place], [place] [address]    Report Status 07/15/2021 FINAL  Final         Radiology Studies: No results found.      Scheduled Meds:  (feeding supplement) PROSource Plus  30 mL Oral BID BM   ascorbic acid  500 mg Oral BID   Chlorhexidine Gluconate Cloth  6 each Topical Daily   cholecalciferol  5,000 Units Oral Daily   [START ON 07/16/2021] cosyntropin  0.25 mg Intravenous Once   feeding supplement  237 mL Oral BID BM   folic acid  1 mg Oral Daily   lactobacillus acidophilus & bulgar  2 tablet Oral TID   lactulose  10 g Oral BID   levothyroxine  125 mcg Oral Q0600   mirtazapine  15 mg Oral QHS   multivitamin with minerals  1 tablet Oral Daily   pantoprazole  40 mg Oral BID AC   sucralfate  1 g Oral TID WC & HS   Continuous Infusions:   LOS: 4 days    Time spent: over 30 min    Fayrene Helper, MD [facility]   To contact the attending provider between 7A-7P or the covering provider during after hours 7P-7A, please log into the web site [email or web address] and access using universal [place] password for that web site. If you do not have the password, please call the hospital operator.  07/15/2021, 6:59 PM

## 2021-07-15 NOTE — TOC Progression Note (Signed)
Transition of Care [facility]) - Progression Note    Patient Details  Name: Sally Wise MRN: [id number] Date of Birth: 25-Jul-1945  Transition of Care [facility]) CM/SW Contact  Amada Jupiter, LCSW Phone Number: 07/15/2021, 1:25 PM  Clinical Narrative:    Per MD, pt may be medically ready for return to SNF in 48 hrs. Pt aware and have alerted admissions at [facility].   Expected Discharge Plan: Skilled Nursing Facility Barriers to Discharge: Continued Medical Work up  Expected Discharge Plan and Services Expected Discharge Plan: Skilled Nursing Facility   Discharge Planning Services: CM Consult Post Acute Care Choice: Skilled Nursing Facility Living arrangements for the past 2 months: Skilled Nursing Facility                                       Social Determinants of Health (SDOH) Interventions    Readmission Risk Interventions Readmission Risk Prevention Plan 07/12/2021 06/08/2020  Post Dischage Appt - Complete  Medication Screening - Complete  Transportation Screening Complete Complete  PCP or Specialist Appt within 3-5 Days Complete -  HRI or Home Care Consult Complete -  Social Work Consult for Recovery Care Planning/Counseling Complete -  Palliative Care Screening Not Applicable -  Medication Review Oceanographer) Complete -  Some recent data might be hidden

## 2021-07-16 ENCOUNTER — Encounter (HOSPITAL_COMMUNITY): Payer: Self-pay | Admitting: Gastroenterology

## 2021-07-16 DIAGNOSIS — R601 Generalized edema: Secondary | ICD-10-CM | POA: Diagnosis not present

## 2021-07-16 LAB — AMMONIA: Ammonia: 63 umol/L — ABNORMAL HIGH (ref 9–35)

## 2021-07-16 LAB — COMPREHENSIVE METABOLIC PANEL
ALT: 21 U/L (ref 0–44)
AST: 15 U/L (ref 15–41)
Albumin: 1.9 g/dL — ABNORMAL LOW (ref 3.5–5.0)
Alkaline Phosphatase: 305 U/L — ABNORMAL HIGH (ref 38–126)
Anion gap: 10 (ref 5–15)
BUN: 75 mg/dL — ABNORMAL HIGH (ref 8–23)
CO2: 20 mmol/L — ABNORMAL LOW (ref 22–32)
Calcium: 7.7 mg/dL — ABNORMAL LOW (ref 8.9–10.3)
Chloride: 111 mmol/L (ref 98–111)
Creatinine, Ser: 1.22 mg/dL — ABNORMAL HIGH (ref 0.44–1.00)
GFR, Estimated: 46 mL/min — ABNORMAL LOW (ref >60)
Glucose, Bld: 96 mg/dL (ref 70–99)
Potassium: 4 mmol/L (ref 3.5–5.1)
Sodium: 141 mmol/L (ref 135–145)
Total Bilirubin: 1 mg/dL (ref 0.3–1.2)
Total Protein: 5.1 g/dL — ABNORMAL LOW (ref 6.5–8.1)

## 2021-07-16 LAB — CBC WITH DIFFERENTIAL/PLATELET
Abs Immature Granulocytes: 0.07 10*3/uL (ref 0.00–0.07)
Basophils Absolute: 0 10*3/uL (ref 0.0–0.1)
Basophils Relative: 0 %
Eosinophils Absolute: 0.2 10*3/uL (ref 0.0–0.5)
Eosinophils Relative: 2 %
HCT: 25.5 % — ABNORMAL LOW (ref 36.0–46.0)
Hemoglobin: 7.9 g/dL — ABNORMAL LOW (ref 12.0–15.0)
Immature Granulocytes: 1 %
Lymphocytes Relative: 46 %
Lymphs Abs: 4 10*3/uL (ref 0.7–4.0)
MCH: 30 pg (ref 26.0–34.0)
MCHC: 31 g/dL (ref 30.0–36.0)
MCV: 97 fL (ref 80.0–100.0)
Monocytes Absolute: 1.8 10*3/uL — ABNORMAL HIGH (ref 0.1–1.0)
Monocytes Relative: 21 %
Neutro Abs: 2.5 10*3/uL (ref 1.7–7.7)
Neutrophils Relative %: 30 %
Platelets: 90 10*3/uL — ABNORMAL LOW (ref 150–400)
RBC: 2.63 MIL/uL — ABNORMAL LOW (ref 3.87–5.11)
RDW: 23.9 % — ABNORMAL HIGH (ref 11.5–15.5)
WBC: 8.5 10*3/uL (ref 4.0–10.5)
nRBC: 0.4 % — ABNORMAL HIGH (ref 0.0–0.2)

## 2021-07-16 LAB — GLIADIN ANTIBODIES, SERUM
Antigliadin Abs, IgA: 10 units (ref 0–19)
Gliadin IgG: 2 units (ref 0–19)

## 2021-07-16 LAB — ACTH STIMULATION, 3 TIME POINTS
Cortisol, 30 Min: 17.8 ug/dL
Cortisol, 60 Min: 18.6 ug/dL
Cortisol, Base: 14.4 ug/dL

## 2021-07-16 LAB — PHOSPHORUS: Phosphorus: 3.6 mg/dL (ref 2.5–4.6)

## 2021-07-16 LAB — MAGNESIUM: Magnesium: 2.3 mg/dL (ref 1.7–2.4)

## 2021-07-16 MED ORDER — VITAMIN K1 10 MG/ML IJ SOLN
10.0000 mg | Freq: Every day | INTRAVENOUS | Status: AC
  Administered 2021-07-16 – 2021-07-18 (×3): 10 mg via INTRAVENOUS
  Filled 2021-07-16 (×3): qty 1

## 2021-07-16 NOTE — Progress Notes (Signed)
IP PROGRESS NOTE  Subjective:   I was asked by Dr. Florene Glen to comment on patient's coagulopathy and elevated to PT and INR.  She is patient is known to me with history of thrombocytopenia.  She was hospitalized due to weakness and failure to thrive and poor nutritional status.  She was found to have persistent coagulopathy with elevated PT and INR.  Laboratory data on January 1 showed an INR of 2.4 with PTT was 25.8.  Her PTT remains elevated at 43.  No active bleeding noted at this time.  Objective:  Vital signs in last 24 hours: Temp:  [97.6 F (36.4 C)-98.7 F (37.1 C)] 97.6 F (36.4 C) (01/03 0631) Pulse Rate:  [84-99] 84 (01/03 0631) Resp:  [16-18] 18 (01/03 0631) BP: (96-102)/(46-78) 101/49 (01/03 0631) SpO2:  [99 %-100 %] 99 % (01/03 0631) Weight:  [291 lb (132 kg)] 291 lb (132 kg) (01/03 0500) Weight change:  Last BM Date: 07/15/21  Intake/Output from previous day: 01/02 0701 - 01/03 0700 In: 990 [P.O.:990] Out: 1175 [Urine:1175] General: Alert, awake without distress. Head: Normocephalic atraumatic. Mouth: mucous membranes moist, pharynx normal without lesions Eyes: No scleral icterus.  Pupils are equal and round reactive to light. Resp: clear to auscultation bilaterally without rhonchi or wheezes or dullness to percussion. Cardio: regular rate and rhythm, S1, S2 normal, no murmur, click, rub or gallop GI: soft, non-tender; bowel sounds normal; no masses,  no organomegaly Musculoskeletal: No joint deformity or effusion. Neurological: No motor, sensory deficits.  Intact deep tendon reflexes. Skin: No rashes or lesions.   Lab Results: Recent Labs    07/14/21 0446 07/16/21 0519  WBC 6.3 8.5  HGB 7.4* 7.9*  HCT 23.8* 25.5*  PLT 91* 90*    BMET Recent Labs    07/14/21 0446 07/16/21 0519  NA 142 141  K 3.5 4.0  CL 114* 111  CO2 21* 20*  GLUCOSE 92 96  BUN 86* 75*  CREATININE 1.09* 1.22*  CALCIUM 7.9* 7.7*    Studies/Results: No results  found.  Medications: I have reviewed the patient's current medications.  Assessment/Plan:  76 year old with:  1.  Coagulopathy with elevated PT and PTT.  Etiology appears to be related to malnutrition and vitamin K deficiency.  Synthetic function abnormalities and liver failure is considered unlikely at this time.  Her AST and ALT remains normal although her ammonia is elevated.  From a management standpoint, I recommended aggressive vitamin K replacement.  I would proceed with 10 mg intravenously daily for 3 consecutive days.  2.  Hepatic dysfunction: Unclear if this is a contributing factor.  I would defer that option to gastroenterology whether additional imaging or evaluation of her liver is recommended.  3.  Thrombocytopenia: Her platelet count is adequate and does not require any transfusion or intervention.  Please call with any questions regarding this patient.  35  minutes were dedicated to this visit.  50% of the time was face-to-face and the time was spent on reviewing laboratory data, imaging studies, discussing treatment options, discussing differential diagnosis and answering questions regarding future plan.    LOS: 5 days   Zola Button 07/16/2021, 11:10 AM

## 2021-07-16 NOTE — Progress Notes (Signed)
Patient has order for ACTH stimulation lab. Per phlebotomists crystal and Shanda Bumps , They were unable to draw pt's blood for ACTH stimulation at this time . Phlebotomists said that tell first shift nurse to call lab after shift change. So this RN unable to give cortrosyn medication.pt's cortrosyn medication need to rescheduled after 0700.

## 2021-07-16 NOTE — Progress Notes (Signed)
RN called lab to draw cortisol level, phlebotomy unable to draw lab at this time, RN unable to give cortrosyn medication. Phlebotomy will draw labs around 0730.

## 2021-07-16 NOTE — Progress Notes (Signed)
PROGRESS NOTE    Sally GrayJanice B Wise  YNW:[id number] DOB: 01/02/1946 DOA: 07/10/2021 PCP: Daisy Florooss, Charles Alan, MD  Chief Complaint  Patient presents with   Weakness    Brief Narrative:  76 yo with hx atrial fibrillation, hypothyroidism, chronic thrombocytopenia, HFpEF, HLD, HTN, depression and multiple other medical issues who presented with weakness and lethargy.  She's had poor PO intake.  Of note, she fractured her ankle in October 2022 and has been living at [facility] since then.  She was admitted with dehydration, hypotension, and AKI on 12/12 and returned on 12/28 with similar issues.  She was seen by GI on 12/30 for upper endoscopy which showed Jimy Gates nonbleeding duodenal ulcer, erythematous mucosa in the gastric body and erythematous duodenopathy.  Labs for H pylori and celiac are pending.  GI continuing to follow.  Heme consulted due to persistently elevated INR.  Palliative has been consulted as well to assist with goals of care.  See below for additional details    Assessment & Plan:   Principal Problem:   Anasarca Active Problems:   Acute metabolic encephalopathy   Lack of appetite   PUD (peptic ulcer disease)   Chronic kidney disease with active medical management without dialysis, stage 3 (moderate) (HCC)   Elevated INR   Thrombocytopenia (HCC)   Fatty liver disease, nonalcoholic   Anemia   Ankle fracture   Hypoalbuminemia   Pressure ulcer   Contamination of blood culture   Morbid obesity (HCC)   PAF (paroxysmal atrial fibrillation) (HCC)   * Anasarca- (present on admission) At presentation thought to be intravascularly depleted S/p IVF with albumin Follow UOP - has foley for I/O given incontinence, too swollen to manage purewick, can d/c in next day or so Strict I/O, daily weights UA without notable proteinuria, UP/C 0.17 Echo with EF 60-65%, grade 1 diastolic dysfunction, normal RVSF, small pericardial effusion (see report) Continue to monitor   Lack of  appetite Unclear cause If not getting sufficient nutrition, could consider tube feeds - she's not open to this at this time.  Will c/s palliative care for assistance with clarifying goals of care. GI c/s -> s/p EGD with normal esophagus, erythematous mucosa in gastric body, erythematous duodenopathy, nonbleeding duodenal ulcer with clean base Follow H pylori ag and celiac serology PPI BID x1 month, sucralfate 1 g QID x2 weeks Negative TTG, pending reticulin ab Pending H pylori stool ab, gliadin ab negative  Acute metabolic encephalopathy Weakness, lethargy Mild at this time, seems generally mostly improved TSH elevated, normal free t4 b12 elevated, folate wnl Elevated ammonia, no asterixis on exam, but will try lactulose to see if this helps with her symptoms Cortisol low normal, follow acth stim test -> >18 Psychiatry who recommended transition from lexapro to remeron (12/31), they've now signed off 07/15/21   PUD (peptic ulcer disease) EGD with erythematous mucosa in gastric body, erythematous duodenopathy, nonbleeding duodenal ulcer with clean base Pending h pylori stool ab, celiac serology (as above) PPI, sucralfate  Elevated INR Unclear etiology Elevated despite vitamin K administration No known doac administration at facility Appreciate Dr. Clelia CroftShadad assistance - 10 mg vit K IV x3 days  Chronic kidney disease with active medical management without dialysis, stage 3 (moderate) (HCC)- (present on admission) Creatinine appears close to baseline, follow  Fatty liver disease, nonalcoholic Consider additional liver imaging Labs seem to suggest poor synthetic function (low albumin, low platelets - though chronic, elevated INR --- bili is normal --- could this be from poor nutrition  alone?) -> consider elastography vs MRI?  Thrombocytopenia ([place])- (present on admission) chronic  Anemia- (present on admission) Fluctuating, trend C/w AOCD - b12/folate wnl  Ankle fracture- (present  on admission) S/p Open treatment left ankle trimalleolar fracture with internal fixation including fixation of the posterior lip 05/07/2021  she's in Cornelious Bartolucci cam boot which can be removed for ROM in PT.  She can bear weight to Elvan Ebron max of 75lbs in the boot for the next month.  Hypoalbuminemia- (present on admission) Due to poor nutrition?  Pressure ulcer Pressure Injury 06/25/21 Coccyx Mid;Lower Stage 3 -  Full thickness tissue loss. Subcutaneous fat may be visible but bone, tendon or muscle are NOT exposed. 1.5 cm by 1 cm (Active)  06/25/21 0130  Location: Coccyx  Location Orientation: Mid;Lower  Staging: Stage 3 -  Full thickness tissue loss. Subcutaneous fat may be visible but bone, tendon or muscle are NOT exposed.  Wound Description (Comments): 1.5 cm by 1 cm  Present on Admission: Yes     Pressure Injury Pretibial Distal;Left Unstageable - Full thickness tissue loss in which the base of the injury is covered by slough (yellow, tan, Wise, green or brown) and/or eschar (tan, brown or black) in the wound bed. 2 cm x 1 cm (Active)     Location: Pretibial  Location Orientation: Distal;Left  Staging: Unstageable - Full thickness tissue loss in which the base of the injury is covered by slough (yellow, tan, Wise, green or brown) and/or eschar (tan, brown or black) in the wound bed.  Wound Description (Comments): 2 cm x 1 cm  Present on Admission: Yes     Pressure Injury 07/10/21 Groin Medial Stage 1 -  Intact skin with non-blanchable redness of Adean Milosevic localized area usually over Jaiveon Suppes bony prominence. medical equipment PI: lines of nonblanchable redness where purewick tubing might have been (Active)  07/10/21 1906  Location: Groin  Location Orientation: Medial  Staging: Stage 1 -  Intact skin with non-blanchable redness of Lailoni Baquera localized area usually over Verna Desrocher bony prominence.  Wound Description (Comments): medical equipment PI: lines of nonblanchable redness where purewick tubing might have been  Present on  Admission: Yes        Contamination of blood culture 1 of 2 from 12/28 with staph epidermidis Suspect contaminant  Difficult stick, refused labs this AM DVT prophylaxis: SCD Code Status: full Family Communication: husband Disposition:   Status is: Inpatient  Remains inpatient appropriate because: continued need for inpatient workup       Consultants:  GI pysch  Procedures:  Echo IMPRESSIONS     1. Left ventricular ejection fraction, by estimation, is 60 to 65%. The  left ventricle has normal function. The left ventricle has no regional  wall motion abnormalities. Left ventricular diastolic parameters are  consistent with Grade I diastolic  dysfunction (impaired relaxation).   2. Right ventricular systolic function is normal. The right ventricular  size is normal. Tricuspid regurgitation signal is inadequate for assessing  PA pressure.   3. Yoshino Broccoli small pericardial effusion is present. The pericardial effusion is  circumferential.   4. The mitral valve is grossly normal. No evidence of mitral valve  regurgitation. No evidence of mitral stenosis.   5. The aortic valve is tricuspid. Aortic valve regurgitation is not  visualized. No aortic stenosis is present.   6. The inferior vena cava is normal in size with greater than 50%  respiratory variability, suggesting right atrial pressure of 3 mmHg.   Antimicrobials:  Anti-infectives (From admission,  onward)    Start     Dose/Rate Route Frequency Ordered Stop   07/11/21 0800  cefTRIAXone (ROCEPHIN) 2 g in sodium chloride 0.9 % 100 mL IVPB  Status:  Discontinued        2 g 200 mL/hr over 30 Minutes Intravenous Every 24 hours 07/10/21 1807 07/11/21 0906   07/10/21 0700  cefTRIAXone (ROCEPHIN) 2 g in sodium chloride 0.9 % 100 mL IVPB        2 g 200 mL/hr over 30 Minutes Intravenous  Once 07/10/21 0654 07/10/21 0816       Subjective: No complaints today Doesn't want feeding tube  Objective: Vitals:   07/15/21  2147 07/16/21 0500 07/16/21 0631 07/16/21 1542  BP: 96/78  (!) 101/49 (!) 105/52  Pulse: 99  84 85  Resp: 18  18 14   Temp: 97.8 F (36.6 C)  97.6 F (36.4 C) 97.8 F (36.6 C)  TempSrc:   Oral Oral  SpO2: 99%  99% 99%  Weight:  132 kg    Height:        Intake/Output Summary (Last 24 hours) at 07/16/2021 1633 Last data filed at 07/16/2021 1545 Gross per 24 hour  Intake 510 ml  Output 1075 ml  Net -565 ml   Filed Weights   07/11/21 0620 07/12/21 1018 07/16/21 0500  Weight: 135.6 kg 135.6 kg 132 kg    Examination:  General: No acute distress. Cardiovunlabored Abdomen: Soft, nontender, nondistended  Neurological: Alert and oriented 3. Moves all extremities 4 . Cranial nerves II through XII grossly intact. Skin: Warm and dry. No rashes or lesions. Extremities: anasarca     Data Reviewed: I have personally reviewed following labs and imaging studies  CBC: Recent Labs  Lab 07/10/21 0350 07/10/21 1255 07/10/21 2112 07/11/21 0350 07/12/21 0622 07/13/21 0934 07/14/21 0446 07/16/21 0519  WBC 8.8 7.4  --  6.5 5.6 5.7 6.3 8.5  NEUTROABS 4.6 4.0  --   --   --   --   --  2.5  HGB 10.0* 9.1*   < > 7.1* 7.6* 8.1* 7.4* 7.9*  HCT 32.0* 28.5*   < > 22.3* 23.6* 26.4* 23.8* 25.5*  MCV 95.8 93.8  --  94.1 94.8 97.1 96.7 97.0  PLT PLATELET CLUMPS NOTED ON SMEAR, UNABLE TO ESTIMATE 108*  --  83* 95* 102* 91* 90*   < > = values in this interval not displayed.    Basic Metabolic Panel: Recent Labs  Lab 07/11/21 0350 07/12/21 0446 07/13/21 0912 07/14/21 0446 07/16/21 0519  NA 139 139 140 142 141  K 3.8 3.8 5.4* 3.5 4.0  CL 111 112* 116* 114* 111  CO2 20* 20* 18* 21* 20*  GLUCOSE 75 85 95 92 96  BUN 114* 97* 91* 86* 75*  CREATININE 1.28* 1.36* 1.08* 1.09* 1.22*  CALCIUM 7.9* 7.9* 7.7* 7.9* 7.7*  MG  --   --   --   --  2.3  PHOS  --   --   --   --  3.6    GFR: Estimated Creatinine Clearance: 55.5 mL/min (Zehra Rucci) (by C-G formula based on SCr of 1.22 mg/dL (H)).  Liver  Function Tests: Recent Labs  Lab 07/10/21 0350 07/11/21 0350 07/16/21 0519  AST 16 16 15   ALT 19 15 21   ALKPHOS 319* 261* 305*  BILITOT 1.2 1.1 1.0  PROT 5.5* 4.6* 5.1*  ALBUMIN 2.2* 2.0* 1.9*    CBG: No results for input(s): GLUCAP in the last 168 hours.  Recent Results (from the past 240 hour(s))  Blood Culture (routine x 2)     Status: Abnormal   Collection Time: 07/10/21  3:50 AM   Specimen: BLOOD  Result Value Ref Range Status   Specimen Description   Final    BLOOD LEFT ANTECUBITAL Performed at [facility], [address], [place], [place] [address]    Special Requests   Final    BOTTLES DRAWN AEROBIC AND ANAEROBIC Blood Culture adequate volume Performed at [facility], [address], [place], [place] [address]    Culture  Setup Time   Final    GRAM POSITIVE COCCI IN CLUSTERS IN BOTH AEROBIC AND ANAEROBIC BOTTLES CRITICAL RESULT CALLED TO, READ BACK BY AND VERIFIED WITH: J. LEGGE 07/11/21 1154 FH    Culture (Junelle Hashemi)  Final    STAPHYLOCOCCUS EPIDERMIDIS THE SIGNIFICANCE OF ISOLATING THIS ORGANISM FROM Miguelina Fore SINGLE SET OF BLOOD CULTURES WHEN MULTIPLE SETS ARE DRAWN IS UNCERTAIN. PLEASE NOTIFY THE MICROBIOLOGY DEPARTMENT WITHIN ONE WEEK IF SPECIATION AND SENSITIVITIES ARE REQUIRED. Performed at [facility], [address], [place], [place] [address]    Report Status 07/13/2021 FINAL  Final  Blood Culture ID Panel (Reflexed)     Status: Abnormal   Collection Time: 07/10/21  3:50 AM  Result Value Ref Range Status   Enterococcus faecalis NOT DETECTED NOT DETECTED Final   Enterococcus Faecium NOT DETECTED NOT DETECTED Final   Listeria monocytogenes NOT DETECTED NOT DETECTED Final   Staphylococcus species DETECTED (Ziara Thelander) NOT DETECTED Final    Comment: CRITICAL RESULT CALLED TO, READ BACK BY AND VERIFIED WITH: J. LEGGE 07/11/21 1154 FH    Staphylococcus aureus (BCID) NOT DETECTED NOT DETECTED Final   Staphylococcus epidermidis  DETECTED (Gethsemane Fischler) NOT DETECTED Final    Comment: Methicillin (oxacillin) resistant coagulase negative staphylococcus. Possible blood culture contaminant (unless isolated from more than one blood culture draw or clinical case suggests pathogenicity). No antibiotic treatment is indicated for blood  culture contaminants. CRITICAL RESULT CALLED TO, READ BACK BY AND VERIFIED WITH: J. LEGGE 07/11/21 1154 FH    Staphylococcus lugdunensis NOT DETECTED NOT DETECTED Final   Streptococcus species NOT DETECTED NOT DETECTED Final   Streptococcus agalactiae NOT DETECTED NOT DETECTED Final   Streptococcus pneumoniae NOT DETECTED NOT DETECTED Final   Streptococcus pyogenes NOT DETECTED NOT DETECTED Final   Maite Burlison.calcoaceticus-baumannii NOT DETECTED NOT DETECTED Final   Bacteroides fragilis NOT DETECTED NOT DETECTED Final   Enterobacterales NOT DETECTED NOT DETECTED Final   Enterobacter cloacae complex NOT DETECTED NOT DETECTED Final   Escherichia coli NOT DETECTED NOT DETECTED Final   Klebsiella aerogenes NOT DETECTED NOT DETECTED Final   Klebsiella oxytoca NOT DETECTED NOT DETECTED Final   Klebsiella pneumoniae NOT DETECTED NOT DETECTED Final   Proteus species NOT DETECTED NOT DETECTED Final   Salmonella species NOT DETECTED NOT DETECTED Final   Serratia marcescens NOT DETECTED NOT DETECTED Final   Haemophilus influenzae NOT DETECTED NOT DETECTED Final   Neisseria meningitidis NOT DETECTED NOT DETECTED Final   Pseudomonas aeruginosa NOT DETECTED NOT DETECTED Final   Stenotrophomonas maltophilia NOT DETECTED NOT DETECTED Final   Candida albicans NOT DETECTED NOT DETECTED Final   Candida auris NOT DETECTED NOT DETECTED Final   Candida glabrata NOT DETECTED NOT DETECTED Final   Candida krusei NOT DETECTED NOT DETECTED Final   Candida parapsilosis NOT DETECTED NOT DETECTED Final   Candida tropicalis NOT DETECTED NOT DETECTED Final   Cryptococcus neoformans/gattii NOT DETECTED NOT DETECTED  Final   Methicillin  resistance mecA/C DETECTED (Emiliya Chretien) NOT DETECTED Final    Comment: CRITICAL RESULT CALLED TO, READ BACK BY AND VERIFIED WITH: J. LEGGE 07/11/21 1154 FH Performed at [facility], [address] [place], [place] [address]   Urine Culture     Status: None   Collection Time: 07/10/21  9:15 AM   Specimen: In/Out Cath Urine  Result Value Ref Range Status   Specimen Description   Final    IN/OUT CATH URINE Performed at [facility] [address] [place], [place] [address]    Special Requests   Final    NONE Performed at [facility], [address] [place], [place] [address]    Culture   Final    NO GROWTH Performed at [facility], [place] [address] [place], [place] [address]    Report Status 07/11/2021 FINAL  Final  Blood Culture (routine x 2)     Status: None   Collection Time: 07/10/21  7:09 PM   Specimen: BLOOD  Result Value Ref Range Status   Specimen Description   Final    BLOOD SITE NOT SPECIFIED Performed at [facility] [address] [place], [place] [address]    Special Requests   Final    BOTTLES DRAWN AEROBIC ONLY Blood Culture results may not be optimal due to an inadequate volume of blood received in culture bottles Performed at [facility] [address] [place], [place] [address]    Culture   Final    NO GROWTH 5 DAYS Performed at [facility], [place] [address], [place], [place] [address]    Report Status 07/15/2021 FINAL  Final         Radiology Studies: No results found.      Scheduled Meds:  (feeding supplement) PROSource Plus  30 mL Oral BID BM   ascorbic acid  500 mg Oral BID   Chlorhexidine Gluconate Cloth  6 each Topical Daily   cholecalciferol  5,000 Units Oral Daily   feeding supplement  237 mL Oral BID BM   folic acid  1 mg Oral Daily   lactobacillus acidophilus & bulgar  2 tablet Oral TID   lactulose  10 g Oral BID   levothyroxine  125 mcg  Oral Q0600   mirtazapine  15 mg Oral QHS   multivitamin with minerals  1 tablet Oral Daily   pantoprazole  40 mg Oral BID AC   sucralfate  1 g Oral TID WC & HS   Continuous Infusions:   LOS: 5 days    Time spent: over 30 min    Fayrene Helper, MD [facility]   To contact the attending provider between 7A-7P or the covering provider during after hours 7P-7A, please log into the web site [email or web address] and access using universal [place] password for that web site. If you do not have the password, please call the hospital operator.  07/16/2021, 4:33 PM

## 2021-07-16 NOTE — Progress Notes (Signed)
PT Cancellation Note  Patient Details Name: Sally Wise MRN: [id number] DOB: 1946-01-22   Cancelled Treatment:    Reason Eval/Treat Not Completed: Other (comment). Defer PT eval to SNF   [facility] 07/16/2021, 8:47 AM

## 2021-07-16 NOTE — Progress Notes (Addendum)
Sally Wise 10:28 AM  Subjective: Patient actually doing better than when I saw her earlier in the holiday week and she does not complain of nausea or pain and says her legs are better and her bowels are okay just does not have much of an appetite but seems to be in slightly better spirits and wants to go home  Objective: Vital signs stable afebrile no acute distress abdomen is soft nontender labs stable BUN slight decrease other labs stable  Assessment: Multiple medical problems  Plan: Will ask my partner Dr. Michail Sermon to round on tomorrow just to see if he has any additional suggestions otherwise may benefit from an appetite stimulant if her current change in antidepressant does not seem to help over the next few weeks  [facility]  office [phone] After 5PM or if no answer call [phone]

## 2021-07-17 DIAGNOSIS — E8809 Other disorders of plasma-protein metabolism, not elsewhere classified: Secondary | ICD-10-CM | POA: Diagnosis not present

## 2021-07-17 DIAGNOSIS — Z515 Encounter for palliative care: Secondary | ICD-10-CM

## 2021-07-17 DIAGNOSIS — N183 Chronic kidney disease, stage 3 unspecified: Secondary | ICD-10-CM | POA: Diagnosis not present

## 2021-07-17 DIAGNOSIS — K279 Peptic ulcer, site unspecified, unspecified as acute or chronic, without hemorrhage or perforation: Secondary | ICD-10-CM

## 2021-07-17 DIAGNOSIS — G9341 Metabolic encephalopathy: Secondary | ICD-10-CM | POA: Diagnosis not present

## 2021-07-17 DIAGNOSIS — R601 Generalized edema: Secondary | ICD-10-CM | POA: Diagnosis not present

## 2021-07-17 DIAGNOSIS — R630 Anorexia: Secondary | ICD-10-CM

## 2021-07-17 DIAGNOSIS — E039 Hypothyroidism, unspecified: Secondary | ICD-10-CM

## 2021-07-17 DIAGNOSIS — D696 Thrombocytopenia, unspecified: Secondary | ICD-10-CM

## 2021-07-17 LAB — CBC WITH DIFFERENTIAL/PLATELET
Abs Immature Granulocytes: 0.06 10*3/uL (ref 0.00–0.07)
Basophils Absolute: 0 10*3/uL (ref 0.0–0.1)
Basophils Relative: 0 %
Eosinophils Absolute: 0.3 10*3/uL (ref 0.0–0.5)
Eosinophils Relative: 3 %
HCT: 25.7 % — ABNORMAL LOW (ref 36.0–46.0)
Hemoglobin: 7.7 g/dL — ABNORMAL LOW (ref 12.0–15.0)
Immature Granulocytes: 1 %
Lymphocytes Relative: 38 %
Lymphs Abs: 3.5 10*3/uL (ref 0.7–4.0)
MCH: 30.1 pg (ref 26.0–34.0)
MCHC: 30 g/dL (ref 30.0–36.0)
MCV: 100.4 fL — ABNORMAL HIGH (ref 80.0–100.0)
Monocytes Absolute: 1.6 10*3/uL — ABNORMAL HIGH (ref 0.1–1.0)
Monocytes Relative: 18 %
Neutro Abs: 3.6 10*3/uL (ref 1.7–7.7)
Neutrophils Relative %: 40 %
Platelets: 88 10*3/uL — ABNORMAL LOW (ref 150–400)
RBC: 2.56 MIL/uL — ABNORMAL LOW (ref 3.87–5.11)
RDW: 24.9 % — ABNORMAL HIGH (ref 11.5–15.5)
WBC: 9 10*3/uL (ref 4.0–10.5)
nRBC: 0.4 % — ABNORMAL HIGH (ref 0.0–0.2)

## 2021-07-17 LAB — COMPREHENSIVE METABOLIC PANEL
ALT: 19 U/L (ref 0–44)
AST: 15 U/L (ref 15–41)
Albumin: 1.9 g/dL — ABNORMAL LOW (ref 3.5–5.0)
Alkaline Phosphatase: 303 U/L — ABNORMAL HIGH (ref 38–126)
Anion gap: 7 (ref 5–15)
BUN: 80 mg/dL — ABNORMAL HIGH (ref 8–23)
CO2: 20 mmol/L — ABNORMAL LOW (ref 22–32)
Calcium: 7.7 mg/dL — ABNORMAL LOW (ref 8.9–10.3)
Chloride: 112 mmol/L — ABNORMAL HIGH (ref 98–111)
Creatinine, Ser: 1.27 mg/dL — ABNORMAL HIGH (ref 0.44–1.00)
GFR, Estimated: 44 mL/min — ABNORMAL LOW (ref >60)
Glucose, Bld: 89 mg/dL (ref 70–99)
Potassium: 4 mmol/L (ref 3.5–5.1)
Sodium: 139 mmol/L (ref 135–145)
Total Bilirubin: 1.1 mg/dL (ref 0.3–1.2)
Total Protein: 4.9 g/dL — ABNORMAL LOW (ref 6.5–8.1)

## 2021-07-17 LAB — PROTIME-INR
INR: 2.4 — ABNORMAL HIGH (ref 0.8–1.2)
Prothrombin Time: 26.1 seconds — ABNORMAL HIGH (ref 11.4–15.2)

## 2021-07-17 LAB — RETICULIN ANTIBODIES, IGA W TITER: Reticulin Ab, IgA: NEGATIVE {titer}

## 2021-07-17 LAB — MAGNESIUM: Magnesium: 2.5 mg/dL — ABNORMAL HIGH (ref 1.7–2.4)

## 2021-07-17 LAB — PHOSPHORUS: Phosphorus: 4.1 mg/dL (ref 2.5–4.6)

## 2021-07-17 MED ORDER — RESOURCE INSTANT PROTEIN PO PWD PACKET
1.0000 | Freq: Three times a day (TID) | ORAL | Status: DC
  Administered 2021-07-19 – 2021-07-29 (×15): 6 g via ORAL
  Filled 2021-07-17 (×37): qty 6

## 2021-07-17 NOTE — Assessment & Plan Note (Addendum)
Synthroid recently increased from 112 mcg to 125 mcg (on 12/19 dc summary noted). TSH slightly improved this admission. -Continue Synthroid -Repeat TSH in 2-3 weeks; increase Synthroid if TSH still elevated

## 2021-07-17 NOTE — Hospital Course (Addendum)
[name] is a 76 y.o. female with a history of atrial fibrillation, hypothyroidism, chronic thrombocytopenia, HFpEF, HLD, HTN, depression. Patient presented secondary to ongoing weakness and poor oral intake. Upper endoscopy significant for non-bleeding duodenal ulcer. Palliative care consulted for goals of care. Plan for discharge to SNF. Medically stable for discharge.

## 2021-07-17 NOTE — Progress Notes (Signed)
PROGRESS NOTE    [name]  [id number] DOB: 1946/02/24 DOA: 07/10/2021 PCP: [name], MD   Brief Narrative: Sally Wise is a 76 y.o. female with a history of atrial fibrillation, hypothyroidism, chronic thrombocytopenia, HFpEF, HLD, HTN, depression. Patient presented secondary to ongoing weakness and poor oral intake. Upper endoscopy significant for non-bleeding duodenal ulcer. Palliative care consulted for goals of care.   Assessment & Plan:   * Anasarca- (present on admission) Chronic. Likely related to chronic hypoalbuminemia. Initially given IV fluids with albumin. Foley catheter placed on 12/29.  PUD (peptic ulcer disease) EGD with erythematous mucosa in gastric body, erythematous duodenopathy, nonbleeding duodenal ulcer with clean base. H. Pylori and celiac serology pending. GI recommending Protonix 40 mg BID x1 month and sucralfate suspension 1 g PO QID x2 weeks -Follow-up h pylori stool ab, celiac serology  Elevated INR Unclear etiology. Hematology consulted and have recommended IV vitamin K -Continue vitamin K IV  Lack of appetite Unclear cause. Dietitian consulted. Psychiatry consulted. Patient started on Remeron to help with appetite. Per patient, she has no decrease in appetite but has particular eating habits. Patient declines enteral nutrition via feeding tube. GI consulted and patient underwent upper endoscopy with erythematous duodenopathy with associated non-bleeding duodenal ulcer with clean base. GI c/s -> s/p EGD with normal esophagus, erythematous mucosa in gastric body, erythematous duodenopathy, nonbleeding duodenal ulcer with clean base.  Acute metabolic encephalopathy Mild. Per husband, this has been a persistent issue. TSH of 8.6 with normal free T4. Ammonia elevated at 45 > 48 > 63. Vitamin B12 and folate are within normal limits. Cortisol normal with a normal ACTH stim test. Psychiatry evaluated and recommended transition from  Lexapro to Remeron. -MRI brain  Hypothyroidism- (present on admission) Synthroid recently increased from 112 mcg to 125 mcg. TSH slightly improved this admission. -Continue Synthroid -Repeat TSH in 2-3 weeks; increase Synthroid if TSH still elevated  Contamination of blood culture 1 of 2 from 12/28 with staph epidermidis. Likely contaminant.  Pressure ulcer Medial groin, POA  Anemia- (present on admission) Chronic and stable.  Ankle fracture- (present on admission) Left ankle. History of ORIF on recent admission on 05/07/21. Max weight bearing of 75 lbs in boot for one month.  Chronic kidney disease with active medical management without dialysis, stage 3 (moderate) ([place])- (present on admission) Stable. Baseline creatinine of about 1.2  Morbid obesity ([place])- (present on admission) Body mass index is 44.25 kg/m.  Fatty liver disease, nonalcoholic Noted.  Hypoalbuminemia- (present on admission) Possibly secondary to poor nutrition vs liver disease. Stable.  Thrombocytopenia ([place])- (present on admission) Chronic and stable. Likely related to underlying liver disease.     DVT prophylaxis: SCDs Code Status:   Code Status: Full Code Family Communication: Husband at bedside Disposition Plan: Discharge back to SNF pending MRI in addition to completion of IV vitamin K, likely in 1-2 days   Consultants:  Palliative care medicine [facility] Gastroenterology Medical oncology Psychiatry  Procedures:  UPPER GI ENDOSCOPY (07/12/2021) Impression:               - Normal esophagus.                           - Erythematous mucosa in the gastric body.                           - Erythematous duodenopathy.                           -  Non-bleeding duodenal ulcer with a clean ulcer                            base ([place] Class III).                           - No specimens collected.  Recommendation:           - Resume regular diet.                           - Use Protonix  (pantoprazole) 40 mg PO BID for 1                            month.                           - Use sucralfate suspension 1 gram PO QID for 2                            weeks.                           - Stool for H pylori Antigen and celiac serology.  Antimicrobials: Ceftriaxone    Subjective: No concerns this morning. Husband is concerned that patient is not eating, however patient states she has been eating.  Objective: Vitals:   07/16/21 2148 07/16/21 2258 07/17/21 0515 07/17/21 1418  BP: (!) 86/48 (!) 99/52 (!) 96/51 (!) 95/44  Pulse: 86 84 87 86  Resp: 17 15 17 18   Temp: 97.6 F (36.4 C)  98 F (36.7 C) 97.6 F (36.4 C)  TempSrc: Oral  Oral Oral  SpO2: 100% 100% 100% 98%  Weight:      Height:        Intake/Output Summary (Last 24 hours) at 07/17/2021 1602 Last data filed at 07/17/2021 1400 Gross per 24 hour  Intake 770.05 ml  Output 1351 ml  Net -580.95 ml   Filed Weights   07/11/21 0620 07/12/21 1018 07/16/21 0500  Weight: 135.6 kg 135.6 kg 132 kg    Examination:  General exam: Appears calm and comfortable Respiratory system: Clear to auscultation. Respiratory effort normal. Cardiovascular system: S1 & S2 heard, RRR. No murmurs, rubs, gallops or clicks. Gastrointestinal system: Abdomen is nondistended, soft and nontender. No organomegaly or masses felt. Normal bowel sounds heard. Central nervous system: Alert and oriented. No focal neurological deficits. Musculoskeletal: Edema noted. No calf tenderness Skin: No cyanosis. No rashes Psychiatry: Judgement and insight appear normal. Flat affect     Data Reviewed: I have personally reviewed following labs and imaging studies  CBC Lab Results  Component Value Date   WBC 9.0 07/17/2021   RBC 2.56 (L) 07/17/2021   HGB 7.7 (L) 07/17/2021   HCT 25.7 (L) 07/17/2021   MCV 100.4 (H) 07/17/2021   MCH 30.1 07/17/2021   PLT 88 (L) 07/17/2021   MCHC 30.0 07/17/2021   RDW 24.9 (H) 07/17/2021   LYMPHSABS 3.5  07/17/2021   MONOABS 1.6 (H) 07/17/2021   EOSABS 0.3 07/17/2021   BASOSABS 0.0 [id number]     Last metabolic panel Lab Results  Component Value Date   NA 139 07/17/2021   K 4.0 07/17/2021  CL 112 (H) 07/17/2021   CO2 20 (L) 07/17/2021   BUN 80 (H) 07/17/2021   CREATININE 1.27 (H) 07/17/2021   GLUCOSE 89 07/17/2021   GFRNONAA 44 (L) 07/17/2021   GFRAA 80 (L) 08/05/2013   CALCIUM 7.7 (L) 07/17/2021   PHOS 4.1 07/17/2021   PROT 4.9 (L) 07/17/2021   ALBUMIN 1.9 (L) 07/17/2021   BILITOT 1.1 07/17/2021   ALKPHOS 303 (H) 07/17/2021   AST 15 07/17/2021   ALT 19 07/17/2021   ANIONGAP 7 07/17/2021    CBG (last 3)  No results for input(s): GLUCAP in the last 72 hours.   GFR: Estimated Creatinine Clearance: 53.4 mL/min (A) (by C-G formula based on SCr of 1.27 mg/dL (H)).  Coagulation Profile: Recent Labs  Lab 07/11/21 0515 07/12/21 0446 07/13/21 0934 07/14/21 0446 07/17/21 0416  INR 2.2* 2.2* 2.2* 2.4* 2.4*    Recent Results (from the past 240 hour(s))  Blood Culture (routine x 2)     Status: Abnormal   Collection Time: 07/10/21  3:50 AM   Specimen: BLOOD  Result Value Ref Range Status   Specimen Description   Final    BLOOD LEFT ANTECUBITAL Performed at [facility] [address], [place], [place] [address]    Special Requests   Final    BOTTLES DRAWN AEROBIC AND ANAEROBIC Blood Culture adequate volume Performed at [facility] [address], [place], [place] [address]    Culture  Setup Time   Final    GRAM POSITIVE COCCI IN CLUSTERS IN BOTH AEROBIC AND ANAEROBIC BOTTLES CRITICAL RESULT CALLED TO, READ BACK BY AND VERIFIED WITH: J. LEGGE 07/11/21 1154 FH    Culture (A)  Final    STAPHYLOCOCCUS EPIDERMIDIS THE SIGNIFICANCE OF ISOLATING THIS ORGANISM FROM A SINGLE SET OF BLOOD CULTURES WHEN MULTIPLE SETS ARE DRAWN IS UNCERTAIN. PLEASE NOTIFY THE MICROBIOLOGY DEPARTMENT WITHIN ONE WEEK IF SPECIATION AND SENSITIVITIES ARE  REQUIRED. Performed at [facility], [place] [address] [place], [place] [address]    Report Status 07/13/2021 FINAL  Final  Blood Culture ID Panel (Reflexed)     Status: Abnormal   Collection Time: 07/10/21  3:50 AM  Result Value Ref Range Status   Enterococcus faecalis NOT DETECTED NOT DETECTED Final   Enterococcus Faecium NOT DETECTED NOT DETECTED Final   Listeria monocytogenes NOT DETECTED NOT DETECTED Final   Staphylococcus species DETECTED (A) NOT DETECTED Final    Comment: CRITICAL RESULT CALLED TO, READ BACK BY AND VERIFIED WITH: J. LEGGE 07/11/21 1154 FH    Staphylococcus aureus (BCID) NOT DETECTED NOT DETECTED Final   Staphylococcus epidermidis DETECTED (A) NOT DETECTED Final    Comment: Methicillin (oxacillin) resistant coagulase negative staphylococcus. Possible blood culture contaminant (unless isolated from more than one blood culture draw or clinical case suggests pathogenicity). No antibiotic treatment is indicated for blood  culture contaminants. CRITICAL RESULT CALLED TO, READ BACK BY AND VERIFIED WITH: J. LEGGE 07/11/21 1154 FH    Staphylococcus lugdunensis NOT DETECTED NOT DETECTED Final   Streptococcus species NOT DETECTED NOT DETECTED Final   Streptococcus agalactiae NOT DETECTED NOT DETECTED Final   Streptococcus pneumoniae NOT DETECTED NOT DETECTED Final   Streptococcus pyogenes NOT DETECTED NOT DETECTED Final   A.calcoaceticus-baumannii NOT DETECTED NOT DETECTED Final   Bacteroides fragilis NOT DETECTED NOT DETECTED Final   Enterobacterales NOT DETECTED NOT DETECTED Final   Enterobacter cloacae complex NOT DETECTED NOT DETECTED Final   Escherichia coli NOT DETECTED NOT DETECTED Final  Klebsiella aerogenes NOT DETECTED NOT DETECTED Final   Klebsiella oxytoca NOT DETECTED NOT DETECTED Final   Klebsiella pneumoniae NOT DETECTED NOT DETECTED Final   Proteus species NOT DETECTED NOT DETECTED Final   Salmonella species NOT DETECTED NOT DETECTED Final    Serratia marcescens NOT DETECTED NOT DETECTED Final   Haemophilus influenzae NOT DETECTED NOT DETECTED Final   Neisseria meningitidis NOT DETECTED NOT DETECTED Final   Pseudomonas aeruginosa NOT DETECTED NOT DETECTED Final   Stenotrophomonas maltophilia NOT DETECTED NOT DETECTED Final   Candida albicans NOT DETECTED NOT DETECTED Final   Candida auris NOT DETECTED NOT DETECTED Final   Candida glabrata NOT DETECTED NOT DETECTED Final   Candida krusei NOT DETECTED NOT DETECTED Final   Candida parapsilosis NOT DETECTED NOT DETECTED Final   Candida tropicalis NOT DETECTED NOT DETECTED Final   Cryptococcus neoformans/gattii NOT DETECTED NOT DETECTED Final   Methicillin resistance mecA/C DETECTED (A) NOT DETECTED Final    Comment: CRITICAL RESULT CALLED TO, READ BACK BY AND VERIFIED WITH: J. LEGGE 07/11/21 1154 FH Performed at [facility], [address] [place], [place] [address]   Urine Culture     Status: None   Collection Time: 07/10/21  9:15 AM   Specimen: In/Out Cath Urine  Result Value Ref Range Status   Specimen Description   Final    IN/OUT CATH URINE Performed at [facility] [address], [place], [place] [address]    Special Requests   Final    NONE Performed at [facility], [place] [address] [place], [place] [address]    Culture   Final    NO GROWTH Performed at [facility], [place] [address] [place], [place] [address]    Report Status 07/11/2021 FINAL  Final  Blood Culture (routine x 2)     Status: None   Collection Time: 07/10/21  7:09 PM   Specimen: BLOOD  Result Value Ref Range Status   Specimen Description   Final    BLOOD SITE NOT SPECIFIED Performed at [facility] [address] [place], [place] [address]    Special Requests   Final    BOTTLES DRAWN AEROBIC ONLY Blood Culture results may not be optimal due to an inadequate volume of blood received in culture bottles Performed at [facility] [address] [place], [place] [address]    Culture   Final    NO GROWTH 5 DAYS Performed at [facility], [place] [address], [place],  [address]    Report Status 07/15/2021 FINAL  Final        Radiology Studies: No results found.      Scheduled Meds:  (feeding supplement) PROSource Plus  30 mL Oral BID BM   ascorbic acid  500 mg Oral BID   Chlorhexidine Gluconate Cloth  6 each Topical Daily   cholecalciferol  5,000 Units Oral Daily   feeding supplement  237 mL Oral BID BM   folic acid  1 mg Oral Daily   lactobacillus acidophilus & bulgar  2 tablet Oral TID   lactulose  10 g Oral BID   levothyroxine  125 mcg Oral Q0600   mirtazapine  15 mg Oral QHS   multivitamin with minerals  1 tablet Oral Daily   pantoprazole  40 mg Oral BID AC   protein supplement  1 Scoop Oral TID WC   sucralfate  1 g Oral TID WC & HS  Continuous Infusions:  phytonadione (VITAMIN K) IV 10 mg (07/17/21 1256)     LOS: 6 days     Cordelia Poche, MD [facility] 07/17/2021, 4:02 PM  If 7PM-7AM, please contact night-coverage [email or web address]

## 2021-07-17 NOTE — Progress Notes (Signed)
Nutrition Follow-up  DOCUMENTATION CODES:   Morbid obesity  INTERVENTION:   -48 hour Calorie Count  -Ensure Enlive po BID, each supplement provides 350 kcal and 20 grams of protein  -Prosource Plus PO BID, each provides 100 kcals and 15g protein  -Magic cup TID with meals, each supplement provides 290 kcal and 9 grams of protein   -Beneprotein powder TID, each provides 25 kcals and 6g protein.  -Encouraged PO intakes and supplements  NUTRITION DIAGNOSIS:   Increased nutrient needs related to acute illness, wound healing as evidenced by estimated needs.  Ongoing.  GOAL:   Patient will meet greater than or equal to 90% of their needs  Not meeting.  MONITOR:   Diet advancement, PO intake, Supplement acceptance, Labs, Weight trends, Skin, I & O's  REASON FOR ASSESSMENT:   Consult Assessment of nutrition requirement/status  ASSESSMENT:   76 y.o. female with medical history significant of pA fib, hypothyroidism, chronic thrombocytopenia, chronic HFpEF, HLD, HTN, depression. Presenting with weakness and lethargy.  She fractured her ankle in October 2022 and since then has been living at a SNF.  She has barely been eating since then and is still unable to walk.  She was admitted on 12/12 for dehydration, hypotension and AKI. Admited for the same.  Patient in room with husband at bedside. Pt reports no appetite and she has not eaten anything today nor yesterday. Pt has breakfast tray sitting at bedside, she agreed to try a few bites and husband to assist. RD brought cold chocolate Ensure for pt to sip on. Have added chocolate Magic cups to all meal trays as well. Pt was made aware.  Calorie Count was requested of MD to assess how much she is taking in.   Medications: Vitamin C, Vitamin D, Folic acid, Lactinex, Lactulose, Remeron, Multivitamin with minerals daily, Carafate, Vitamin K  Labs reviewed:  Elevated Mg  NUTRITION - FOCUSED PHYSICAL EXAM:  Flowsheet Row Most  Recent Value  Orbital Region No depletion  Upper Arm Region No depletion  Thoracic and Lumbar Region No depletion  Buccal Region No depletion  Temple Region Mild depletion  Clavicle Bone Region No depletion  Clavicle and Acromion Bone Region No depletion  Scapular Bone Region No depletion  Dorsal Hand No depletion  Edema (RD Assessment) Moderate  [generalized]  Hair Reviewed  Eyes Reviewed  Mouth Reviewed  Skin Reviewed       Diet Order:   Diet Order             Diet regular Room service appropriate? Yes; Fluid consistency: Thin  Diet effective now                   EDUCATION NEEDS:   No education needs have been identified at this time  Skin:  Skin Assessment: Skin Integrity Issues: Skin Integrity Issues:: Stage III, Unstageable, Stage I Stage I: medial groin Stage III: mid coccyx -healing per WOC note Unstageable: distal L pretibial  Last BM:  1/4 -type 6  Height:   Ht Readings from Last 1 Encounters:  07/12/21 5\' 8"  (1.727 m)    Weight:   Wt Readings from Last 1 Encounters:  07/16/21 132 kg    BMI:  Body mass index is 44.25 kg/m.  Estimated Nutritional Needs:   Kcal:  2200-2400  Protein:  120-130g  Fluid:  2.2L/day   Clayton Bibles, MS, RD, LDN Inpatient Clinical Dietitian Contact information available via Amion

## 2021-07-17 NOTE — Plan of Care (Signed)

## 2021-07-17 NOTE — Progress Notes (Signed)
[facility] Gastroenterology Progress Note  Sally Wise 76 y.o. [birth year]   Subjective: Not eating. No appetite. Denies abdominal pain, N/V. Patient frustrated with her husband constantly asking about her lack of eating.  Objective: Vital signs: Vitals:   07/16/21 2258 07/17/21 0515  BP: (!) 99/52 (!) 96/51  Pulse: 84 87  Resp: 15 17  Temp:  98 F (36.7 C)  SpO2: 100% 100%    Physical Exam: Gen: lethargic, elderly, chronically ill-appearing, no acute distress, obese HEENT: anicteric sclera CV: RRR Chest: CTA B Abd: soft, nontender, nondistended, +BS Ext: anasarca   Lab Results: Recent Labs    07/16/21 0519 07/17/21 0416  NA 141 139  K 4.0 4.0  CL 111 112*  CO2 20* 20*  GLUCOSE 96 89  BUN 75* 80*  CREATININE 1.22* 1.27*  CALCIUM 7.7* 7.7*  MG 2.3 2.5*  PHOS 3.6 4.1   Recent Labs    07/16/21 0519 07/17/21 0416  AST 15 15  ALT 21 19  ALKPHOS 305* 303*  BILITOT 1.0 1.1  PROT 5.1* 4.9*  ALBUMIN 1.9* 1.9*   Recent Labs    07/16/21 0519 07/17/21 0416  WBC 8.5 9.0  NEUTROABS 2.5 3.6  HGB 7.9* 7.7*  HCT 25.5* 25.7*  MCV 97.0 100.4*  PLT 90* 88*      Assessment/Plan: Failure to thrive - continue supportive care and  hopefully Mirtazapine will help appetite. Defer to primary team whether to start an appetite stimulant. Agree with palliative care medicine evaluation. Patient and husband not in favor of feeding tube per palliative consult. She expresses frustration with constantly being told to eat. Discussed and encouraged the need to improve her nutrition and subsequent strength by eating. No new GI recs. Will sign off. Call if questions.   Lear Ng 07/17/2021, 11:37 AM  Questions please call [phone] Patient ID: Sally Wise, female   DOB: 09/25/1945, 76 y.o.   MRN: [id number]

## 2021-07-17 NOTE — Consult Note (Signed)
Consultation Note Date: 07/17/2021   Patient Name: Sally Wise  DOB: 1946/04/03  MRN: [id number]  Age / Sex: 76 y.o., female  PCP: [name], MD Referring Physician: Mariel Aloe, MD  Reason for Consultation: Establishing goals of care  HPI/Patient Profile: 76 y.o. female   admitted on 07/10/2021    Clinical Assessment and Goals of Care: 76 year old lady with hypothyroidism chronic thrombocytopenia atrial fibrillation heart failure with preserved ejection fraction dyslipidemia hypertension depression multiple other medical issues presented with weakness lethargy poor oral intake, recent ankle fracture in October 2022 has been in a SNF since then.  Admitted with dehydration low blood pressures and acute kidney injury, seen by GI upper endoscopy showed erythematous mucosa in gastric body and duodenopathy as well as nonbleeding ulcer.  Seen by psychiatry, started on mirtazapine, palliative consultation for broad goals of care discussions as well as discussions with regards to artificial nutrition and hydration has been requested. Patient is awake alert resting in bed.  Her husband is present at the bedside.  I introduced myself and palliative care as follows: Palliative medicine is specialized medical care for people living with serious illness. It focuses on providing relief from the symptoms and stress of a serious illness. The goal is to improve quality of life for both the patient and the family. Goals of care: Broad aims of medical therapy in relation to the patient's values and preferences. Our aim is to provide medical care aimed at enabling patients to achieve the goals that matter most to them, given the circumstances of their particular medical situation and their constraints.  Goals wishes and values important to the patient and family as a unit attempted to be explored.  Discussed about scope  of current hospitalization.  Underlying conditions explored.  Discussed about artificial nutrition and hydration versus continued oral intake and monitoring of oral intake, use of supplements.  See below.  HCPOA Husband  SUMMARY OF RECOMMENDATIONS   Patient elects for continuation of Full Code Status, patient and husband state that they have completed advance care planning documents in the past, they have a living will and HCPOA papers at home.  Will request Dietitian consult, consider Magic Cup, calorie count to monitor calorie intake. Patient and husband not in favor of insertion of NG tube/tube feeding trial at this time.  Recommend continuation of Mirtazapine, efforts at Physical Therapy and Dietitian assistance.  Recommend SNF rehab with palliative services following on discharge.  Thank you for the consult.   Code Status/Advance Care Planning: Full code   Symptom Management:     Palliative Prophylaxis:  Monitor oral intake  Additional Recommendations (Limitations, Scope, Preferences): Full Scope Treatment  Psycho-social/Spiritual:  Desire for further Chaplaincy support:yes Additional Recommendations: Caregiving  Support/Resources  Prognosis:  Unable to determine  Discharge Planning: [facility] for rehab with Palliative care service follow-up      Primary Diagnoses: Present on Admission:  Anasarca  Chronic kidney disease with active medical management without dialysis, stage 3 (moderate) (HCC)  Hypoalbuminemia  PAF (paroxysmal atrial fibrillation) (HCC)  Morbid obesity (HCC)  Ankle fracture  Anemia  Thrombocytopenia ([place])   I have reviewed the medical record, interviewed the patient and family, and examined the patient. The following aspects are pertinent.  Past Medical History:  Diagnosis Date   Hematuria    evaluation with Dr. 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 Remote hx, re-established care 01/2013 with Dr. Johnsie Cancel for recurrence. 2D echo 01/2013: mod LVH, EF 55-65%, no RWMA, grade 1 d/d. b. 06/2014: started on Xarelto.    Thrombocytopenia ([place])    Vitamin D deficiency 06/13/2010   Wears glasses    Social History   Socioeconomic History   Marital status: Married    Spouse name: Not on file   Number of children: Not on file   Years of education: Not on file   Highest education level: Not on file  Occupational History   Not on file  Tobacco Use   Smoking status: Never   Smokeless tobacco: Never  Vaping Use   Vaping Use: Never used  Substance and Sexual Activity   Alcohol use: No   Drug use: No   Sexual activity: Yes    Partners: Male    Birth control/protection: Surgical  Other Topics Concern   Not on file  Social History Narrative   Not on file   Social Determinants of Health   Financial Resource Strain: Not on file  Food Insecurity: Not on file  Transportation Needs: Not on file  Physical Activity: Not on file  Stress: Not on file  Social Connections: Not on file   Family History  Problem Relation Age of Onset   Hypertension Mother    Kidney disease Mother    Alzheimer's disease Father    Diabetes Brother    Hypertension Brother    Spina bifida Daughter    Scheduled Meds:  (feeding supplement) PROSource Plus  30 mL Oral BID BM   ascorbic acid  500 mg Oral BID   Chlorhexidine Gluconate Cloth  6 each Topical Daily   cholecalciferol  5,000 Units Oral Daily   feeding supplement  237 mL Oral BID BM   folic acid  1 mg Oral Daily   lactobacillus acidophilus & bulgar  2 tablet Oral TID   lactulose  10 g Oral BID   levothyroxine  125 mcg Oral Q0600   mirtazapine  15 mg Oral QHS   multivitamin with minerals  1 tablet Oral Daily   pantoprazole  40 mg Oral BID AC   sucralfate  1 g Oral TID WC & HS    Continuous Infusions:  phytonadione (VITAMIN K) IV Stopped (07/16/21 2351)   PRN Meds:.acetaminophen **OR** acetaminophen, bisacodyl, HYDROcodone-acetaminophen, ondansetron Medications Prior to Admission:  Prior to Admission medications   Medication Sig Start Date End Date Taking? Authorizing Provider  ascorbic acid (VITAMIN C) 500 MG tablet Take 1 tablet (500 mg total) by mouth 2 (two) times daily. 06/28/21  Yes Lavina Hamman, MD  bisacodyl 5 MG EC tablet Take 5 mg by mouth daily as needed for moderate constipation.   Yes [provider]  Cholecalciferol (VITAMIN D3) 5000 UNITS TABS Take 1 tablet by mouth daily.   Yes [provider]  escitalopram (LEXAPRO) 5 MG tablet Take 5  mg by mouth at bedtime. 07/02/21  Yes [provider]  ferrous sulfate 325 (65 FE) MG tablet Take 325 mg by mouth daily with breakfast.   Yes [provider]  fludrocortisone (FLORINEF) 0.1 MG tablet Take 100 mcg by mouth daily. 07/08/21  Yes [provider]  folic acid (FOLVITE) 1 MG tablet Take 1 tablet (1 mg total) by mouth daily. 03/17/21  Yes Mercy Riding, MD  HYDROcodone-acetaminophen (NORCO/VICODIN) 5-325 MG tablet Take 1 tablet by mouth every 6 (six) hours as needed for moderate pain or severe pain. 06/28/21  Yes Lavina Hamman, MD  lactobacillus acidophilus & bulgar (LACTINEX) chewable tablet Chew 2 tablets by mouth 3 (three) times daily. 06/28/21  Yes Lavina Hamman, MD  levothyroxine (EUTHYROX) 125 MCG tablet Take 1 tablet (125 mcg total) by mouth daily before breakfast. 06/29/21  Yes Lavina Hamman, MD  nutrition supplement, JUVEN, (JUVEN) PACK Take 1 packet by mouth 2 (two) times daily between meals. Patient taking differently: Take 1 packet by mouth 2 (two) times daily. Midday and bedtime 06/28/21  Yes Lavina Hamman, MD  Nutritional Supplements (,FEEDING SUPPLEMENT, PROSOURCE PLUS) liquid Take 30 mLs by mouth 2 (two) times daily between meals. 06/28/21  Yes  Lavina Hamman, MD  ondansetron (ZOFRAN) 4 MG tablet Take 4 mg by mouth every 8 (eight) hours as needed for nausea or vomiting.   Yes [provider]  zinc sulfate 220 (50 Zn) MG capsule Take 1 capsule (220 mg total) by mouth daily. 06/29/21  Yes Lavina Hamman, MD  feeding supplement (ENSURE ENLIVE / ENSURE PLUS) LIQD Take 237 mLs by mouth 2 (two) times daily between meals. Patient not taking: Reported on 07/10/2021 06/28/21   Lavina Hamman, MD  mirtazapine (REMERON) 15 MG tablet Take 1 tablet (15 mg total) by mouth at bedtime. Patient not taking: Reported on 07/10/2021 06/28/21   Lavina Hamman, MD  Nystatin (GERHARDT'S BUTT CREAM) CREA Apply 1 application topically 2 (two) times daily. Patient not taking: Reported on 07/10/2021 06/28/21   Lavina Hamman, MD   No Known Allergies Review of Systems Denies any pain, states that her ankle is getting better, states that she just does not have any appetite. Physical Exam Elderly appearing lady resting in bed Appears chronically ill Not in any acute distress Has anasarca Regular work of breathing Abdomen is not tender  Vital Signs: BP (!) 96/51 (BP Location: Left Arm)    Pulse 87    Temp 98 F (36.7 C) (Oral)    Resp 17    Ht 5\' 8"  (1.727 m)    Wt 132 kg    LMP 07/14/2001    SpO2 100%    BMI 44.25 kg/m  Pain Scale: 0-10   Pain Score: 0-No pain   SpO2: SpO2: 100 % O2 Device:SpO2: 100 % O2 Flow Rate: .   IO: Intake/output summary:  Intake/Output Summary (Last 24 hours) at 07/17/2021 [id number] Last data filed at 07/17/2021 0600 Gross per 24 hour  Intake 890.05 ml  Output 1601 ml  Net -710.95 ml    LBM: Last BM Date: 07/15/21 Baseline Weight: Weight: (!) 144.2 kg Most recent weight: Weight: 132 kg     Palliative Assessment/Data:   PPS 40%  Time In:  9 Time Out: 10  Time Total:  60  Greater than 50%  of this time was spent counseling and coordinating care related to the above assessment and plan.  Signed by: Loistine Chance,  MD   Please contact Palliative Medicine Team phone at [phone] for questions and concerns.  For individual provider: See Shea Evans

## 2021-07-17 NOTE — Assessment & Plan Note (Addendum)
Body mass index is 44.25 kg/m.  1. Dietitian recommendations: 2. 48 hour Calorie Count (07/19/2021) 3. Ensure Enlive po BID, each supplement provides 350 kcal and 20 grams of protein 4. Prosource Plus PO BID, each provides 100 kcals and 15g protein 5. Magic cup TID with meals, each supplement provides 290 kcal and 9 grams of protein 6. Beneprotein powder TID, each provides 25 kcals and 6g protein. 7. Encouraged PO intakes and supplements

## 2021-07-18 ENCOUNTER — Inpatient Hospital Stay (HOSPITAL_COMMUNITY): Payer: Medicare Other

## 2021-07-18 DIAGNOSIS — N183 Chronic kidney disease, stage 3 unspecified: Secondary | ICD-10-CM | POA: Diagnosis not present

## 2021-07-18 DIAGNOSIS — G9341 Metabolic encephalopathy: Secondary | ICD-10-CM | POA: Diagnosis not present

## 2021-07-18 DIAGNOSIS — R601 Generalized edema: Secondary | ICD-10-CM | POA: Diagnosis not present

## 2021-07-18 DIAGNOSIS — E8809 Other disorders of plasma-protein metabolism, not elsewhere classified: Secondary | ICD-10-CM | POA: Diagnosis not present

## 2021-07-18 NOTE — Progress Notes (Signed)
Calorie Count Note  48 hour calorie count ordered.  Diet: regular  Supplements:   -Ensure Enlive po BID, each supplement provides 350 kcal and 20 grams of protein  -Prosource Plus PO BID, each provides 100 kcals and 15g protein -Magic cup TID with meals, each supplement provides 290 kcal and 9 grams of protein  -Beneprotein powder TID, each provides 25 kcals and 6g protein.  1/4: Breakfast: minimal -25% of banana, 1/2 applesauce w/ meds ~70 kcals Lunch: minimal Dinner: minimal Supplements: 1 Ensure HP, 1 Magic cup = 640 kcals, 29g protein  Total intake: 710 kcal (32% of minimum estimated needs)  29g protein (24% of minimum estimated needs)  **Pt's husband reporting pt does better drinking than eating. Provided another chocolate Ensure High Protein during visit.  Nutrition Dx: Increased nutrient needs related to acute illness, wound healing as evidenced by estimated needs.  Goal: Pt to meet >/= 90% of their estimated nutrition needs   Intervention:  -48 hour Calorie Count  -Ensure Enlive po BID, each supplement provides 350 kcal and 20 grams of protein  -Prosource Plus PO BID, each provides 100 kcals and 15g protein  -Magic cup TID with meals, each supplement provides 290 kcal and 9 grams of protein   -Beneprotein powder TID, each provides 25 kcals and 6g protein. -Encouraged PO intakes and supplements  Tilda Franco, MS, RD, LDN Inpatient Clinical Dietitian Contact information available via Amion

## 2021-07-18 NOTE — Progress Notes (Signed)
PROGRESS NOTE    [name]  [id number] DOB: Feb 13, 1946 DOA: 07/10/2021 PCP: [name], MD   Brief Narrative: Sally Wise is a 76 y.o. female with a history of atrial fibrillation, hypothyroidism, chronic thrombocytopenia, HFpEF, HLD, HTN, depression. Patient presented secondary to ongoing weakness and poor oral intake. Upper endoscopy significant for non-bleeding duodenal ulcer. Palliative care consulted for goals of care.   Assessment & Plan:   * Anasarca- (present on admission) Chronic. Likely related to chronic hypoalbuminemia. Initially given IV fluids with albumin. Foley catheter placed on 12/29.  PUD (peptic ulcer disease) EGD with erythematous mucosa in gastric body, erythematous duodenopathy, nonbleeding duodenal ulcer with clean base. H. Pylori and celiac serology pending. GI recommending Protonix 40 mg BID x1 month and sucralfate suspension 1 g PO QID x2 weeks -Follow-up h pylori stool ab, celiac serology  Elevated INR Unclear etiology. Hematology consulted and have recommended IV vitamin K -Continue vitamin K IV  Lack of appetite Unclear cause. Dietitian consulted. Psychiatry consulted. Patient started on Remeron to help with appetite. Per patient, she has no decrease in appetite but has particular eating habits. Patient declines enteral nutrition via feeding tube. GI consulted and patient underwent upper endoscopy with erythematous duodenopathy with associated non-bleeding duodenal ulcer with clean base. -Continue Remeron  Acute metabolic encephalopathy Mild. Per husband, this has been a persistent issue. TSH of 8.6 with normal free T4. Ammonia elevated at 45 > 48 > 63. Vitamin B12 and folate are within normal limits. Cortisol normal with a normal ACTH stim test. Psychiatry evaluated and recommended transition from Lexapro to Remeron. -MRI brain still pending  Hypothyroidism- (present on admission) Synthroid recently increased from 112 mcg to  125 mcg. TSH slightly improved this admission. -Continue Synthroid -Repeat TSH in 2-3 weeks; increase Synthroid if TSH still elevated  Contamination of blood culture 1 of 2 from 12/28 with staph epidermidis. Likely contaminant.  Pressure ulcer Medial groin, POA  Anemia- (present on admission) Chronic and stable.  Ankle fracture- (present on admission) Left ankle. History of ORIF on recent admission on 05/07/21. Max weight bearing of 75 lbs in boot for one month.  Chronic kidney disease with active medical management without dialysis, stage 3 (moderate) ([place])- (present on admission) Stable. Baseline creatinine of about 1.2  Morbid obesity ([place])- (present on admission) Body mass index is 44.25 kg/m.  Fatty liver disease, nonalcoholic Likely diagnosis from recent abdominal ultrasound and more recent CT abdomen/pelvis. Patient would benefit from outpatient hepatology.  Hypoalbuminemia- (present on admission) Possibly secondary to poor nutrition vs liver disease. Stable.  Thrombocytopenia ([place])- (present on admission) Chronic and stable. Likely related to underlying liver disease.     DVT prophylaxis: SCDs Code Status:   Code Status: Full Code Family Communication: Husband at bedside Disposition Plan: Discharge to SNF likely in 24 hours pending MRI in addition to completion of IV vitamin K   Consultants:  Palliative care medicine [facility] Gastroenterology Medical oncology Psychiatry  Procedures:  UPPER GI ENDOSCOPY (07/12/2021) Impression:               - Normal esophagus.                           - Erythematous mucosa in the gastric body.                           - Erythematous duodenopathy.                           -  Non-bleeding duodenal ulcer with a clean ulcer                            base ([place] Class III).                           - No specimens collected.  Recommendation:           - Resume regular diet.                           - Use Protonix  (pantoprazole) 40 mg PO BID for 1                            month.                           - Use sucralfate suspension 1 gram PO QID for 2                            weeks.                           - Stool for H pylori Antigen and celiac serology.  Antimicrobials: Ceftriaxone    Subjective: Continues to have decreased oral intake. Asking about why she needs an MRI.  Objective: Vitals:   07/17/21 0515 07/17/21 1418 07/17/21 2112 07/18/21 0542  BP: (!) 96/51 (!) 95/44 102/80 (!) 109/52  Pulse: 87 86 87 93  Resp: 17 18 16 16   Temp: 98 F (36.7 C) 97.6 F (36.4 C) 98.7 F (37.1 C) 99.1 F (37.3 C)  TempSrc: Oral Oral Oral Oral  SpO2: 100% 98% 100% 98%  Weight:      Height:        Intake/Output Summary (Last 24 hours) at 07/18/2021 1122 Last data filed at 07/18/2021 0900 Gross per 24 hour  Intake 240 ml  Output 750 ml  Net -510 ml    Filed Weights   07/11/21 0620 07/12/21 1018 07/16/21 0500  Weight: 135.6 kg 135.6 kg 132 kg    Examination:  General exam: Appears calm and comfortable Respiratory system: Clear to auscultation. Respiratory effort normal. Cardiovascular system: S1 & S2 heard, RRR. No murmurs, rubs, gallops or clicks. Gastrointestinal system: Abdomen is nondistended, soft and nontender. No organomegaly or masses felt. Normal bowel sounds heard. Central nervous system: Alert and oriented. Generalized weakness without focality Musculoskeletal: Diffuse edema. No calf tenderness Skin: No cyanosis. No rashes Psychiatry: Judgement and insight appear normal. Flat affect    Data Reviewed: I have personally reviewed following labs and imaging studies  CBC Lab Results  Component Value Date   WBC 9.0 07/17/2021   RBC 2.56 (L) 07/17/2021   HGB 7.7 (L) 07/17/2021   HCT 25.7 (L) 07/17/2021   MCV 100.4 (H) 07/17/2021   MCH 30.1 07/17/2021   PLT 88 (L) 07/17/2021   MCHC 30.0 07/17/2021   RDW 24.9 (H) 07/17/2021   LYMPHSABS 3.5 07/17/2021   MONOABS 1.6 (H)  07/17/2021   EOSABS 0.3 07/17/2021   BASOSABS 0.0 [id number]     Last metabolic panel Lab Results  Component Value Date   NA 139 07/17/2021   K 4.0 07/17/2021   CL 112 (H) 07/17/2021  CO2 20 (L) 07/17/2021   BUN 80 (H) 07/17/2021   CREATININE 1.27 (H) 07/17/2021   GLUCOSE 89 07/17/2021   GFRNONAA 44 (L) 07/17/2021   GFRAA 80 (L) 08/05/2013   CALCIUM 7.7 (L) 07/17/2021   PHOS 4.1 07/17/2021   PROT 4.9 (L) 07/17/2021   ALBUMIN 1.9 (L) 07/17/2021   BILITOT 1.1 07/17/2021   ALKPHOS 303 (H) 07/17/2021   AST 15 07/17/2021   ALT 19 07/17/2021   ANIONGAP 7 07/17/2021    CBG (last 3)  No results for input(s): GLUCAP in the last 72 hours.   GFR: Estimated Creatinine Clearance: 53.4 mL/min (A) (by C-G formula based on SCr of 1.27 mg/dL (H)).  Coagulation Profile: Recent Labs  Lab 07/12/21 0446 07/13/21 0934 07/14/21 0446 07/17/21 0416  INR 2.2* 2.2* 2.4* 2.4*     Recent Results (from the past 240 hour(s))  Blood Culture (routine x 2)     Status: Abnormal   Collection Time: 07/10/21  3:50 AM   Specimen: BLOOD  Result Value Ref Range Status   Specimen Description   Final    BLOOD LEFT ANTECUBITAL Performed at [facility] [address], [place], [place] [address]    Special Requests   Final    BOTTLES DRAWN AEROBIC AND ANAEROBIC Blood Culture adequate volume Performed at [facility] [address], [place], [place] [address]    Culture  Setup Time   Final    GRAM POSITIVE COCCI IN CLUSTERS IN BOTH AEROBIC AND ANAEROBIC BOTTLES CRITICAL RESULT CALLED TO, READ BACK BY AND VERIFIED WITH: J. LEGGE 07/11/21 1154 FH    Culture (A)  Final    STAPHYLOCOCCUS EPIDERMIDIS THE SIGNIFICANCE OF ISOLATING THIS ORGANISM FROM A SINGLE SET OF BLOOD CULTURES WHEN MULTIPLE SETS ARE DRAWN IS UNCERTAIN. PLEASE NOTIFY THE MICROBIOLOGY DEPARTMENT WITHIN ONE WEEK IF SPECIATION AND SENSITIVITIES ARE REQUIRED. Performed at [facility],  [place] [address] [place],  [address]    Report Status 07/13/2021 FINAL  Final  Blood Culture ID Panel (Reflexed)     Status: Abnormal   Collection Time: 07/10/21  3:50 AM  Result Value Ref Range Status   Enterococcus faecalis NOT DETECTED NOT DETECTED Final   Enterococcus Faecium NOT DETECTED NOT DETECTED Final   Listeria monocytogenes NOT DETECTED NOT DETECTED Final   Staphylococcus species DETECTED (A) NOT DETECTED Final    Comment: CRITICAL RESULT CALLED TO, READ BACK BY AND VERIFIED WITH: J. LEGGE 07/11/21 1154 FH    Staphylococcus aureus (BCID) NOT DETECTED NOT DETECTED Final   Staphylococcus epidermidis DETECTED (A) NOT DETECTED Final    Comment: Methicillin (oxacillin) resistant coagulase negative staphylococcus. Possible blood culture contaminant (unless isolated from more than one blood culture draw or clinical case suggests pathogenicity). No antibiotic treatment is indicated for blood  culture contaminants. CRITICAL RESULT CALLED TO, READ BACK BY AND VERIFIED WITH: J. LEGGE 07/11/21 1154 FH    Staphylococcus lugdunensis NOT DETECTED NOT DETECTED Final   Streptococcus species NOT DETECTED NOT DETECTED Final   Streptococcus agalactiae NOT DETECTED NOT DETECTED Final   Streptococcus pneumoniae NOT DETECTED NOT DETECTED Final   Streptococcus pyogenes NOT DETECTED NOT DETECTED Final   A.calcoaceticus-baumannii NOT DETECTED NOT DETECTED Final   Bacteroides fragilis NOT DETECTED NOT DETECTED Final   Enterobacterales NOT DETECTED NOT DETECTED Final   Enterobacter cloacae complex NOT DETECTED NOT DETECTED Final   Escherichia coli NOT DETECTED NOT DETECTED Final   Klebsiella aerogenes NOT DETECTED NOT DETECTED Final  Klebsiella oxytoca NOT DETECTED NOT DETECTED Final   Klebsiella pneumoniae NOT DETECTED NOT DETECTED Final   Proteus species NOT DETECTED NOT DETECTED Final   Salmonella species NOT DETECTED NOT DETECTED Final   Serratia marcescens NOT DETECTED NOT DETECTED Final    Haemophilus influenzae NOT DETECTED NOT DETECTED Final   Neisseria meningitidis NOT DETECTED NOT DETECTED Final   Pseudomonas aeruginosa NOT DETECTED NOT DETECTED Final   Stenotrophomonas maltophilia NOT DETECTED NOT DETECTED Final   Candida albicans NOT DETECTED NOT DETECTED Final   Candida auris NOT DETECTED NOT DETECTED Final   Candida glabrata NOT DETECTED NOT DETECTED Final   Candida krusei NOT DETECTED NOT DETECTED Final   Candida parapsilosis NOT DETECTED NOT DETECTED Final   Candida tropicalis NOT DETECTED NOT DETECTED Final   Cryptococcus neoformans/gattii NOT DETECTED NOT DETECTED Final   Methicillin resistance mecA/C DETECTED (A) NOT DETECTED Final    Comment: CRITICAL RESULT CALLED TO, READ BACK BY AND VERIFIED WITH: J. LEGGE 07/11/21 1154 FH Performed at [facility], [address], [place], [place] [address]   Urine Culture     Status: None   Collection Time: 07/10/21  9:15 AM   Specimen: In/Out Cath Urine  Result Value Ref Range Status   Specimen Description   Final    IN/OUT CATH URINE Performed at [facility] [address] [place], [place] [address]    Special Requests   Final    NONE Performed at [facility], [place] [address] [place], [place] [address]    Culture   Final    NO GROWTH Performed at [facility], [place] [address] [place], [place] [address]    Report Status 07/11/2021 FINAL  Final  Blood Culture (routine x 2)     Status: None   Collection Time: 07/10/21  7:09 PM   Specimen: BLOOD  Result Value Ref Range Status   Specimen Description   Final    BLOOD SITE NOT SPECIFIED Performed at [facility] [address], [place], [place] [address]    Special Requests   Final    BOTTLES DRAWN AEROBIC ONLY Blood Culture results may not be optimal due to an inadequate volume of blood received in culture bottles Performed at [facility] [address]  [place], [place] [address]    Culture   Final    NO GROWTH 5 DAYS Performed at [facility], [place] [address] [place], [place] [address]    Report Status 07/15/2021 FINAL  Final         Radiology Studies: No results found.      Scheduled Meds:  (feeding supplement) PROSource Plus  30 mL Oral BID BM   ascorbic acid  500 mg Oral BID   Chlorhexidine Gluconate Cloth  6 each Topical Daily   cholecalciferol  5,000 Units Oral Daily   feeding supplement  237 mL Oral BID BM   folic acid  1 mg Oral Daily   lactobacillus acidophilus & bulgar  2 tablet Oral TID   lactulose  10 g Oral BID   levothyroxine  125 mcg Oral Q0600   mirtazapine  15 mg Oral QHS   multivitamin with minerals  1 tablet Oral Daily   pantoprazole  40 mg Oral BID AC   protein supplement  1 [place]oop Oral TID WC   sucralfate  1 g Oral TID WC & HS   Continuous Infusions:  phytonadione (VITAMIN K)  IV 10 mg (07/17/21 1256)     LOS: 7 days     Cordelia Poche, MD [facility] 07/18/2021, 11:22 AM  If 7PM-7AM, please contact night-coverage [email or web address]

## 2021-07-18 NOTE — TOC Progression Note (Signed)
Transition of Care [facility]) - Progression Note    Patient Details  Name: Sally Wise MRN: [id number] Date of Birth: 1945/08/28  Transition [facility] CM/SW Contact  Lennart Pall, LCSW Phone Number: 07/18/2021, 11:54 AM  Clinical Narrative:    Met with pt and spouse today to discuss dc needs/ arrangements.  Spouse expressing concern that pt is "just not able to do the therapy anymore" at SNF and feels she needs LTC SNF at this point.  We discussed possible ins coverage for SNF still based on wound care needs.  Spouse asked that I reach out to additional SNFs to check if LTC beds would be possible and he understands this would be private pay.  Will contact facilities as requested by spouse.  Appears pt may be medically cleared for dc in 1-2 days.   Expected Discharge Plan: [place] Barriers to Discharge: Continued Medical Work up  Expected Discharge Plan and Services Expected Discharge Plan: [employer]   Discharge Planning Services: CM Consult Post Acute Care Choice: [place] Living arrangements for the past 2 months: [place]                                       Social Determinants of Health (SDOH) Interventions    Readmission Risk Interventions Readmission Risk Prevention Plan 07/12/2021 06/08/2020  Post Dischage Appt - Complete  Medication Screening - Complete  Transportation Screening Complete Complete  PCP or Specialist Appt within 3-5 Days Complete -  HRI or Home Care Consult Complete -  Social Work Consult for [place] Planning/Counseling Complete -  Palliative Care Screening Not Applicable -  Medication Review [employer]) Complete -  Some recent data might be hidden

## 2021-07-19 DIAGNOSIS — R601 Generalized edema: Secondary | ICD-10-CM | POA: Diagnosis not present

## 2021-07-19 NOTE — Progress Notes (Signed)
Patient ate 5 teaspoons of applesauce. Drank one ginger ale .  With Sips of ice water during pm-am shift.

## 2021-07-19 NOTE — NC FL2 (Signed)
[place] LEVEL OF CARE SCREENING TOOL     IDENTIFICATION  Patient Name: Sally Wise Birthdate: [birth year] Sex: female Admission Date (Current Location): 07/10/2021  [facility] and [place] Number:  Herbalist and Address:  [facility],  [place], [place]      Provider Number: [id number]  Attending Physician Name and Address:  Mariel Aloe, MD  Relative Name and Phone Number:  James(spouse) [phone]    Current Level of Care: Hospital Recommended Level of Care: [place] Prior Approval Number:    Date Approved/Denied:   PASRR Number: [id number]  Discharge Plan: SNF    Current Diagnoses: Patient Active Problem List   Diagnosis Date Noted   Acute metabolic encephalopathy [id number]   Lack of appetite 07/14/2021   PUD (peptic ulcer disease) 07/14/2021   Elevated INR 07/14/2021   Fatty liver disease, nonalcoholic [id number]   Contamination of blood culture 07/14/2021   Anasarca 07/11/2021   History of COVID-19 06/29/2021   Foley catheter in place 06/29/2021   Failure to thrive in adult 06/27/2021   Hypoalbuminemia 06/27/2021   Depression 06/27/2021   CAP (community acquired pneumonia) 06/27/2021   Pleural effusion 06/27/2021   Daytime somnolence 06/27/2021   Pressure ulcer 06/25/2021   Acute renal failure superimposed on stage 3a chronic kidney disease ([place]) 06/24/2021   Hypotension 06/24/2021   Ankle fracture 04/28/2021   Chronic diastolic CHF (congestive heart failure) ([place]) 04/28/2021   Anemia 04/28/2021   Prolonged QT interval 04/28/2021   Mucoid diarrhea 03/16/2021   Diarrhea 03/14/2021   Acute on chronic diastolic HF (heart failure) ([place]) 08/24/2020   Obesity, Class III, BMI 40-49.9 (morbid obesity) ([place]) 08/24/2020   Chronic kidney disease with active medical management without dialysis, stage 3 (moderate) ([place]) 08/24/2020   Combined rheumatic disorders of mitral, aortic and  tricuspid valves 08/24/2020   Congestive rheumatic heart failure ([place]) 08/24/2020   Hardening of the aorta (main artery of the heart) ([place]) 08/24/2020   Kidney stone 08/24/2020   Pure hypercholesterolemia 08/24/2020   Scoliosis of thoracic spine 08/24/2020   Solitary pulmonary nodule 08/24/2020   Other long term (current) drug therapy 08/24/2020   Abnormal findings on diagnostic imaging of other specified body structures 08/24/2020   Acute respiratory failure with hypoxia ([place]) 06/04/2020   Multifocal pneumonia 06/03/2020   Low back pain 12/12/2019   History of total knee replacement, right 0000000   Lichen sclerosus et atrophicus 01/04/2015   LVH (left ventricular hypertrophy)    PAF (paroxysmal atrial fibrillation) ([place]) 07/06/2014   Thrombocytopenia ([place]) 07/06/2014   Hyperglycemia 07/06/2014   Morbid obesity ([place]) 01/25/2013   Hypothyroidism 01/15/2013   History of total knee replacement, left 08/09/2007    Orientation RESPIRATION BLADDER Height & Weight     Self, Time, Situation, Place  Normal Incontinent, Indwelling catheter Weight: 291 lb (132 kg) Height:  5\' 8"  (172.7 cm)  BEHAVIORAL SYMPTOMS/MOOD NEUROLOGICAL BOWEL NUTRITION STATUS      Incontinent Diet (Regular)  AMBULATORY STATUS COMMUNICATION OF NEEDS Skin   Total Care (hoyer lift) Verbally PU Stage and Appropriate Care PU Stage 1 Dressing: Daily   PU Stage 3 Dressing: Daily                 Personal Care Assistance Level of Assistance  Bathing, Feeding, Dressing Bathing Assistance: Maximum assistance Feeding assistance: Limited assistance Dressing Assistance: Maximum assistance     Functional Limitations Info  Sight, Hearing, Speech Sight Info: Impaired  Hearing Info: Adequate Speech Info: Adequate    SPECIAL CARE FACTORS FREQUENCY  PT (By licensed PT)     PT Frequency: 5x/wk              Contractures Contractures Info: Not present    Additional Factors Info  Code Status, Allergies Code  Status Info: Full Allergies Info: NKDA           Current Medications (07/19/2021):  This is the current hospital active medication list Current Facility-Administered Medications  Medication Dose Route Frequency Provider Last Rate Last Admin   (feeding supplement) PROSource Plus liquid 30 mL  30 mL Oral BID BM Ronnette Juniper, MD   30 mL at 07/19/21 1428   acetaminophen (TYLENOL) tablet 650 mg  650 mg Oral Q6H PRN Ronnette Juniper, MD       Or   acetaminophen (TYLENOL) suppository 650 mg  650 mg Rectal Q6H PRN Ronnette Juniper, MD       ascorbic acid (VITAMIN C) tablet 500 mg  500 mg Oral BID Ronnette Juniper, MD   500 mg at 07/19/21 0936   bisacodyl (DULCOLAX) EC tablet 5 mg  5 mg Oral Daily PRN Ronnette Juniper, MD       Chlorhexidine Gluconate Cloth 2 % PADS 6 each  6 each Topical Daily Ronnette Juniper, MD   6 each at 07/19/21 [phone]   cholecalciferol (VITAMIN D) tablet 5,000 Units  5,000 Units Oral Daily Ronnette Juniper, MD   5,000 Units at 07/19/21 0936   feeding supplement (ENSURE ENLIVE / ENSURE PLUS) liquid 237 mL  237 mL Oral BID BM Rizwan, Eunice Blase, MD   237 mL at [id number]   folic acid (FOLVITE) tablet 1 mg  1 mg Oral Daily Ronnette Juniper, MD   1 mg at 07/19/21 [id number]   HYDROcodone-acetaminophen (NORCO/VICODIN) 5-325 MG per tablet 1 tablet  1 tablet Oral Q6H PRN Ronnette Juniper, MD       lactobacillus acidophilus & bulgar (LACTINEX) chewable tablet 2 tablet  2 tablet Oral TID Ronnette Juniper, MD   2 tablet at 07/19/21 1235   lactulose ([place]) 10 GM/15ML solution 10 g  10 g Oral BID Elodia Florence., MD   10 g at 07/19/21 [id number]   levothyroxine (SYNTHROID) tablet 125 mcg  125 mcg Oral Q0600 Ronnette Juniper, MD   125 mcg at 07/19/21 0531   mirtazapine (REMERON) tablet 15 mg  15 mg Oral QHS Elodia Florence., MD   15 mg at 07/18/21 2057   multivitamin with minerals tablet 1 tablet  1 tablet Oral Daily Debbe Odea, MD   1 tablet at 07/19/21 0936   ondansetron (ZOFRAN) tablet 4 mg  4 mg Oral Q8H PRN Ronnette Juniper, MD        pantoprazole (PROTONIX) EC tablet 40 mg  40 mg Oral BID AC Ronnette Juniper, MD   40 mg at 07/19/21 0732   protein supplement (RESOURCE BENEPROTEIN) powder packet 6 g  1 Scoop Oral TID WC Mariel Aloe, MD   6 g at 07/19/21 1235   sucralfate (CARAFATE) 1 GM/10ML suspension 1 g  1 g Oral TID WC & HS Ronnette Juniper, MD   1 g at 07/19/21 1230     Discharge Medications: Please see discharge summary for a list of discharge medications.  Relevant Imaging Results:  Relevant Lab Results:   Additional Information [phone]  Gennesis Hogland, LCSW

## 2021-07-19 NOTE — Care Management Important Message (Signed)
Important Message  Patient Details IM Letter placed in Patients room. Name: Sally Wise MRN: [id number] Date of Birth: 24-Apr-1946   Medicare Important Message Given:  Yes     [name] 07/19/2021, 12:26 PM

## 2021-07-19 NOTE — Progress Notes (Signed)
PROGRESS NOTE    Sally Wise  [id number] DOB: 1946-04-08 DOA: 07/10/2021 PCP: [name], MD   Brief Narrative: PAULINE RITCHIE is a 76 y.o. female with a history of atrial fibrillation, hypothyroidism, chronic thrombocytopenia, HFpEF, HLD, HTN, depression. Patient presented secondary to ongoing weakness and poor oral intake. Upper endoscopy significant for non-bleeding duodenal ulcer. Palliative care consulted for goals of care. Plan for discharge to SNF. Medically stable for discharge.   Assessment & Plan:   * Anasarca- (present on admission) Chronic. Likely related to chronic hypoalbuminemia. Initially given IV fluids with albumin. Foley catheter placed on 12/29.  PUD (peptic ulcer disease) EGD with erythematous mucosa in gastric body, erythematous duodenopathy, nonbleeding duodenal ulcer with clean base. H. Pylori and celiac serology pending. GI recommending Protonix 40 mg BID x1 month and sucralfate suspension 1 g PO QID x2 weeks. Reticulin IgA antibody negative. -Re-order h pylori stool ab  Elevated INR Unclear etiology. Hematology consulted and have recommended IV vitamin K -Continue vitamin K IV  Lack of appetite Unclear cause. Dietitian consulted. Psychiatry consulted. Patient started on Remeron to help with appetite. Per patient, she has no decrease in appetite but has particular eating habits. Patient declines enteral nutrition via feeding tube. GI consulted and patient underwent upper endoscopy with erythematous duodenopathy with associated non-bleeding duodenal ulcer with clean base. -Continue Remeron  Hypothyroidism- (present on admission) Synthroid recently increased from 112 mcg to 125 mcg. TSH slightly improved this admission. -Continue Synthroid -Repeat TSH in 2-3 weeks; increase Synthroid if TSH still elevated  Contamination of blood culture 1 of 2 from 12/28 with staph epidermidis. Likely contaminant.  Acute metabolic encephalopathy Mild.  Per husband, this has been a persistent issue. TSH of 8.6 with normal free T4. Ammonia elevated at 45 > 48 > 63. Vitamin B12 and folate are within normal limits. Cortisol normal with a normal ACTH stim test. Psychiatry evaluated and recommended transition from Lexapro to Remeron. MRI brain without acute process. Chronic ischemic changes noted. Discussed with neurology and findings do not explain patient presentation; dural thickening acknowledged but recommendation for no further workup per neurology unless neurologic symptoms arose.  Pressure ulcer Medial groin, POA  Anemia- (present on admission) Chronic and stable.  Ankle fracture- (present on admission) Left ankle. History of ORIF on recent admission on 05/07/21. Max weight bearing of 75 lbs in boot for one month.  Chronic kidney disease with active medical management without dialysis, stage 3 (moderate) ([place])- (present on admission) Stable. Baseline creatinine of about 1.2  Morbid obesity ([place])- (present on admission) Body mass index is 44.25 kg/m.  Dietitian recommendations: 48 hour Calorie Count Ensure Enlive po BID, each supplement provides 350 kcal and 20 grams of protein Prosource Plus PO BID, each provides 100 kcals and 15g protein Magic cup TID with meals, each supplement provides 290 kcal and 9 grams of protein  Beneprotein powder TID, each provides 25 kcals and 6g protein. Encouraged PO intakes and supplements  Fatty liver disease, nonalcoholic Likely diagnosis from recent abdominal ultrasound and more recent CT abdomen/pelvis. Patient would benefit from outpatient hepatology.  Hypoalbuminemia- (present on admission) Possibly secondary to poor nutrition vs liver disease. Stable.  Thrombocytopenia ([place])- (present on admission) Chronic and stable. Likely related to underlying liver disease.     DVT prophylaxis: SCDs Code Status:   Code Status: Full Code Family Communication: Husband on telephone Disposition Plan:  Discharge to SNF when bed is available   Consultants:  Palliative care medicine [facility] Medical  oncology Psychiatry  Procedures:  UPPER GI ENDOSCOPY (07/12/2021) Impression:               - Normal esophagus.                           - Erythematous mucosa in the gastric body.                           - Erythematous duodenopathy.                           - Non-bleeding duodenal ulcer with a clean ulcer                            base ([place] Class III).                           - No specimens collected.  Recommendation:           - Resume regular diet.                           - Use Protonix (pantoprazole) 40 mg PO BID for 1                            month.                           - Use sucralfate suspension 1 gram PO QID for 2                            weeks.                           - Stool for H pylori Antigen and celiac serology.  Antimicrobials: Ceftriaxone    Subjective: No issues noted overnight. Minimal oral intake noted on flowsheets  Objective: Vitals:   07/18/21 2101 07/19/21 0537 07/19/21 0600 07/19/21 1303  BP: (!) 100/38 (!) 81/43 (!) 89/48 95/71  Pulse: 84 86 85 88  Resp: 16 16 17 18   Temp: (!) 97.5 F (36.4 C) (!) 97.5 F (36.4 C) 97.7 F (36.5 C) 97.8 F (36.6 C)  TempSrc: Oral Oral Oral Oral  SpO2: 100% 100% 100% 100%  Weight:      Height:        Intake/Output Summary (Last 24 hours) at 07/19/2021 1330 Last data filed at 07/19/2021 0753 Gross per 24 hour  Intake 180 ml  Output 300 ml  Net -120 ml    Filed Weights   07/11/21 0620 07/12/21 1018 07/16/21 0500  Weight: 135.6 kg 135.6 kg 132 kg    Examination:  General: Well appearing, no distress    Data Reviewed: I have personally reviewed following labs and imaging studies  CBC Lab Results  Component Value Date   WBC 9.0 07/17/2021   RBC 2.56 (L) 07/17/2021   HGB 7.7 (L) 07/17/2021   HCT 25.7 (L) 07/17/2021   MCV 100.4 (H) 07/17/2021   MCH 30.1  07/17/2021   PLT 88 (L) 07/17/2021   MCHC 30.0 07/17/2021   RDW 24.9 (H) 07/17/2021   LYMPHSABS 3.5  07/17/2021   MONOABS 1.6 (H) 07/17/2021   EOSABS 0.3 07/17/2021   BASOSABS 0.0 [id number]     Last metabolic panel Lab Results  Component Value Date   NA 139 07/17/2021   K 4.0 07/17/2021   CL 112 (H) 07/17/2021   CO2 20 (L) 07/17/2021   BUN 80 (H) 07/17/2021   CREATININE 1.27 (H) 07/17/2021   GLUCOSE 89 07/17/2021   GFRNONAA 44 (L) 07/17/2021   GFRAA 80 (L) 08/05/2013   CALCIUM 7.7 (L) 07/17/2021   PHOS 4.1 07/17/2021   PROT 4.9 (L) 07/17/2021   ALBUMIN 1.9 (L) 07/17/2021   BILITOT 1.1 07/17/2021   ALKPHOS 303 (H) 07/17/2021   AST 15 07/17/2021   ALT 19 07/17/2021   ANIONGAP 7 07/17/2021    CBG (last 3)  No results for input(s): GLUCAP in the last 72 hours.   GFR: Estimated Creatinine Clearance: 53.4 mL/min (A) (by C-G formula based on SCr of 1.27 mg/dL (H)).  Coagulation Profile: Recent Labs  Lab 07/13/21 0934 07/14/21 0446 07/17/21 0416  INR 2.2* 2.4* 2.4*     Recent Results (from the past 240 hour(s))  Blood Culture (routine x 2)     Status: Abnormal   Collection Time: 07/10/21  3:50 AM   Specimen: BLOOD  Result Value Ref Range Status   Specimen Description   Final    BLOOD LEFT ANTECUBITAL Performed at [facility] [address], [place], [place] [address]    Special Requests   Final    BOTTLES DRAWN AEROBIC AND ANAEROBIC Blood Culture adequate volume Performed at [facility] [address], [place], [place] [address]    Culture  Setup Time   Final    GRAM POSITIVE COCCI IN CLUSTERS IN BOTH AEROBIC AND ANAEROBIC BOTTLES CRITICAL RESULT CALLED TO, READ BACK BY AND VERIFIED WITH: J. LEGGE 07/11/21 1154 FH    Culture (A)  Final    STAPHYLOCOCCUS EPIDERMIDIS THE SIGNIFICANCE OF ISOLATING THIS ORGANISM FROM A SINGLE SET OF BLOOD CULTURES WHEN MULTIPLE SETS ARE DRAWN IS UNCERTAIN. PLEASE NOTIFY THE  MICROBIOLOGY DEPARTMENT WITHIN ONE WEEK IF SPECIATION AND SENSITIVITIES ARE REQUIRED. Performed at [facility], [place] [address] [place], [place] [address]    Report Status 07/13/2021 FINAL  Final  Blood Culture ID Panel (Reflexed)     Status: Abnormal   Collection Time: 07/10/21  3:50 AM  Result Value Ref Range Status   Enterococcus faecalis NOT DETECTED NOT DETECTED Final   Enterococcus Faecium NOT DETECTED NOT DETECTED Final   Listeria monocytogenes NOT DETECTED NOT DETECTED Final   Staphylococcus species DETECTED (A) NOT DETECTED Final    Comment: CRITICAL RESULT CALLED TO, READ BACK BY AND VERIFIED WITH: J. LEGGE 07/11/21 1154 FH    Staphylococcus aureus (BCID) NOT DETECTED NOT DETECTED Final   Staphylococcus epidermidis DETECTED (A) NOT DETECTED Final    Comment: Methicillin (oxacillin) resistant coagulase negative staphylococcus. Possible blood culture contaminant (unless isolated from more than one blood culture draw or clinical case suggests pathogenicity). No antibiotic treatment is indicated for blood  culture contaminants. CRITICAL RESULT CALLED TO, READ BACK BY AND VERIFIED WITH: J. LEGGE 07/11/21 1154 FH    Staphylococcus lugdunensis NOT DETECTED NOT DETECTED Final   Streptococcus species NOT DETECTED NOT DETECTED Final   Streptococcus agalactiae NOT DETECTED NOT DETECTED Final   Streptococcus pneumoniae NOT DETECTED NOT DETECTED Final   Streptococcus pyogenes NOT DETECTED NOT DETECTED Final   A.calcoaceticus-baumannii NOT DETECTED NOT DETECTED Final  Bacteroides fragilis NOT DETECTED NOT DETECTED Final   Enterobacterales NOT DETECTED NOT DETECTED Final   Enterobacter cloacae complex NOT DETECTED NOT DETECTED Final   Escherichia coli NOT DETECTED NOT DETECTED Final   Klebsiella aerogenes NOT DETECTED NOT DETECTED Final   Klebsiella oxytoca NOT DETECTED NOT DETECTED Final   Klebsiella pneumoniae NOT DETECTED NOT DETECTED Final   Proteus species NOT DETECTED NOT  DETECTED Final   Salmonella species NOT DETECTED NOT DETECTED Final   Serratia marcescens NOT DETECTED NOT DETECTED Final   Haemophilus influenzae NOT DETECTED NOT DETECTED Final   Neisseria meningitidis NOT DETECTED NOT DETECTED Final   Pseudomonas aeruginosa NOT DETECTED NOT DETECTED Final   Stenotrophomonas maltophilia NOT DETECTED NOT DETECTED Final   Candida albicans NOT DETECTED NOT DETECTED Final   Candida auris NOT DETECTED NOT DETECTED Final   Candida glabrata NOT DETECTED NOT DETECTED Final   Candida krusei NOT DETECTED NOT DETECTED Final   Candida parapsilosis NOT DETECTED NOT DETECTED Final   Candida tropicalis NOT DETECTED NOT DETECTED Final   Cryptococcus neoformans/gattii NOT DETECTED NOT DETECTED Final   Methicillin resistance mecA/C DETECTED (A) NOT DETECTED Final    Comment: CRITICAL RESULT CALLED TO, READ BACK BY AND VERIFIED WITH: J. LEGGE 07/11/21 1154 FH Performed at [facility], [address], [place], [place] [address]   Urine Culture     Status: None   Collection Time: 07/10/21  9:15 AM   Specimen: In/Out Cath Urine  Result Value Ref Range Status   Specimen Description   Final    IN/OUT CATH URINE Performed at [facility] [address], [place], [place] [address]    Special Requests   Final    NONE Performed at [facility], [place] [address], [place], [place] [address]    Culture   Final    NO GROWTH Performed at [facility], [place] [address], [place], [place] [address]    Report Status 07/11/2021 FINAL  Final  Blood Culture (routine x 2)     Status: None   Collection Time: 07/10/21  7:09 PM   Specimen: BLOOD  Result Value Ref Range Status   Specimen Description   Final    BLOOD SITE NOT SPECIFIED Performed at [facility] [address], [place], [place] [address]    Special Requests   Final    BOTTLES DRAWN AEROBIC ONLY Blood Culture results may not be optimal due to an  inadequate volume of blood received in culture bottles Performed at [facility] [address], [place], [place] [address]    Culture   Final    NO GROWTH 5 DAYS Performed at [facility], [place] [address], [place], [place] [address]    Report Status 07/15/2021 FINAL  Final         Radiology Studies: MR BRAIN WO CONTRAST  Result Date: 07/19/2021 CLINICAL DATA:  Mental status change. EXAM: MRI HEAD WITHOUT CONTRAST TECHNIQUE: Multiplanar, multiecho pulse sequences of the brain and surrounding structures were obtained without intravenous contrast. COMPARISON:  None. FINDINGS: Brain: There is no evidence of an acute infarct, intracranial hemorrhage, midline shift, or extra-axial fluid collection. Mild cerebral atrophy is within normal limits for age. T2 hyperintensities in the cerebral white matter bilaterally are nonspecific but compatible with mild chronic small vessel ischemic disease. There is mild-to-moderate smooth diffuse dural thickening over both cerebral convexities. No mass is identified on this unenhanced study. Vascular: Major  intracranial vascular flow voids are preserved. Skull and upper cervical spine: Diffusely diminished bone marrow T1 signal intensity throughout the skull and included upper cervical spine. Sinuses/Orbits: Bilateral cataract extraction. Extensive circumferential mucosal thickening in the sphenoid sinuses. Mild scattered mucosal thickening elsewhere in the paranasal sinuses. Moderate bilateral mastoid effusions. Other: None. IMPRESSION: 1. No acute infarct. 2. Mild chronic small vessel ischemic disease. 3. Smooth diffuse dural thickening over both cerebral convexities, nonspecific but can be seen with remote subdural hemorrhage, intracranial hypotension, infection, inflammatory/infiltrative dural processes, and neoplasm. 4. Diffusely abnormal bone marrow signal, nonspecific but likely related to the patient's known anemia.  Infiltrative/myelofibrotic marrow processes and metastatic disease can also give this appearance. Electronically Signed   By: [place] Bores M.D.   On: 07/19/2021 09:20        Scheduled Meds:  (feeding supplement) PROSource Plus  30 mL Oral BID BM   ascorbic acid  500 mg Oral BID   Chlorhexidine Gluconate Cloth  6 each Topical Daily   cholecalciferol  5,000 Units Oral Daily   feeding supplement  237 mL Oral BID BM   folic acid  1 mg Oral Daily   lactobacillus acidophilus & bulgar  2 tablet Oral TID   lactulose  10 g Oral BID   levothyroxine  125 mcg Oral Q0600   mirtazapine  15 mg Oral QHS   multivitamin with minerals  1 tablet Oral Daily   pantoprazole  40 mg Oral BID AC   protein supplement  1 Scoop Oral TID WC   sucralfate  1 g Oral TID WC & HS   Continuous Infusions:     LOS: 8 days     Cordelia Poche, MD [facility] 07/19/2021, 1:30 PM  If 7PM-7AM, please contact night-coverage [email or web address]

## 2021-07-19 NOTE — Progress Notes (Signed)
Calorie Count Note   48 hour calorie count ordered.   Diet: regular   Supplements:   -Ensure Enlive po BID, each supplement provides 350 kcal and 20 grams of protein  -Prosource Plus PO BID, each provides 100 kcals and 15g protein -Magic cup TID with meals, each supplement provides 290 kcal and 9 grams of protein  -Beneprotein powder TID, each provides 25 kcals and 6g protein.   1/5: Breakfast: 340 kcals, 5g protein Lunch: refused Dinner: refused Supplements: 75% of Ensure Plus High Protein (260 kcals, 15g protein) Total intake: 600 kcal (27% of minimum estimated needs)  20g protein (16% of minimum estimated needs)   1/6: Per RN, pt consumed juice, ginger ale, 1/2 applesauce + Beneprotein +1 Ensure Plus +Prosource Plus. This totals 665 kcals and 31g protein. Just needs encouragement from staff.  Pt agreed to another Ensure later today. Alerted RN.   Nutrition Dx: Increased nutrient needs related to acute illness, wound healing as evidenced by estimated needs.   Goal: Pt to meet >/= 90% of their estimated nutrition needs    Intervention:  -d/c 48 hour Calorie Count  -Ensure Enlive po BID, each supplement provides 350 kcal and 20 grams of protein  -Prosource Plus PO BID, each provides 100 kcals and 15g protein  -Magic cup TID with meals, each supplement provides 290 kcal and 9 grams of protein   -Beneprotein powder TID, each provides 25 kcals and 6g protein. -Encouraged PO intakes and supplements   Clayton Bibles, MS, RD, LDN Inpatient Clinical Dietitian Contact information available via Amion

## 2021-07-19 NOTE — Plan of Care (Signed)
°  Problem: Education: Goal: Knowledge of General Education information will improve Description: Including pain rating scale, medication(s)/side effects and non-pharmacologic comfort measures Outcome: Progressing   Problem: Clinical Measurements: Goal: Will remain free from infection Outcome: Progressing Goal: Respiratory complications will improve Outcome: Progressing   Problem: Coping: Goal: Level of anxiety will decrease Outcome: Progressing   Problem: Elimination: Goal: Will not experience complications related to bowel motility Outcome: Progressing Goal: Will not experience complications related to urinary retention Outcome: Progressing   Problem: Safety: Goal: Ability to remain free from injury will improve Outcome: Progressing

## 2021-07-20 DIAGNOSIS — G9341 Metabolic encephalopathy: Secondary | ICD-10-CM | POA: Diagnosis not present

## 2021-07-20 DIAGNOSIS — E8809 Other disorders of plasma-protein metabolism, not elsewhere classified: Secondary | ICD-10-CM | POA: Diagnosis not present

## 2021-07-20 DIAGNOSIS — R601 Generalized edema: Secondary | ICD-10-CM | POA: Diagnosis not present

## 2021-07-20 DIAGNOSIS — N183 Chronic kidney disease, stage 3 unspecified: Secondary | ICD-10-CM | POA: Diagnosis not present

## 2021-07-20 MED ORDER — ZINC OXIDE 40 % EX OINT
TOPICAL_OINTMENT | CUTANEOUS | Status: DC | PRN
  Filled 2021-07-20 (×2): qty 57

## 2021-07-20 NOTE — Progress Notes (Signed)
PROGRESS NOTE    Sally Wise  [id number] DOB: [birth year] DOA: 07/10/2021 PCP: [name], MD   Brief Narrative: Sally Wise is a 76 y.o. female with a history of atrial fibrillation, hypothyroidism, chronic thrombocytopenia, HFpEF, HLD, HTN, depression. Patient presented secondary to ongoing weakness and poor oral intake. Upper endoscopy significant for non-bleeding duodenal ulcer. Palliative care consulted for goals of care. Plan for discharge to SNF. Medically stable for discharge.   Assessment & Plan:   * Anasarca- (present on admission) Chronic. Likely related to chronic hypoalbuminemia. Initially given IV fluids with albumin. Foley catheter placed on 12/29.  PUD (peptic ulcer disease) EGD with erythematous mucosa in gastric body, erythematous duodenopathy, nonbleeding duodenal ulcer with clean base. H. Pylori and celiac serology pending. GI recommending Protonix 40 mg BID x1 month and sucralfate suspension 1 g PO QID x2 weeks. Reticulin IgA antibody negative. -Re-order h pylori stool ab (pending)  Elevated INR Unclear etiology. Hematology consulted and have recommended IV vitamin K. Treatment completed.  Lack of appetite Unclear cause. Dietitian consulted. Psychiatry consulted. Patient started on Remeron to help with appetite. Per patient, she has no decrease in appetite but has particular eating habits. Patient declines enteral nutrition via feeding tube. GI consulted and patient underwent upper endoscopy with erythematous duodenopathy with associated non-bleeding duodenal ulcer with clean base. -Continue Remeron  Hypothyroidism- (present on admission) Synthroid recently increased from 112 mcg to 125 mcg. TSH slightly improved this admission. -Continue Synthroid -Repeat TSH in 2-3 weeks; increase Synthroid if TSH still elevated  Contamination of blood culture 1 of 2 from 12/28 with staph epidermidis. Likely contaminant.  Acute metabolic  encephalopathy Mild. Per husband, this has been a persistent issue. TSH of 8.6 with normal free T4. Ammonia elevated at 45 > 48 > 63. Vitamin B12 and folate are within normal limits. Cortisol normal with a normal ACTH stim test. Psychiatry evaluated and recommended transition from Lexapro to Remeron. MRI brain without acute process. Chronic ischemic changes noted. Discussed with neurology and findings do not explain patient presentation; dural thickening acknowledged but recommendation for no further workup per neurology unless neurologic symptoms arose.  Pressure ulcer Medial groin, POA  Anemia- (present on admission) Chronic and stable.  Ankle fracture- (present on admission) Left ankle. History of ORIF on recent admission on 05/07/21. Max weight bearing of 75 lbs in boot for one month.  Chronic kidney disease with active medical management without dialysis, stage 3 (moderate) ([place])- (present on admission) Stable. Baseline creatinine of about 1.2  Morbid obesity ([place])- (present on admission) Body mass index is 44.25 kg/m.  Dietitian recommendations: 48 hour Calorie Count Ensure Enlive po BID, each supplement provides 350 kcal and 20 grams of protein Prosource Plus PO BID, each provides 100 kcals and 15g protein Magic cup TID with meals, each supplement provides 290 kcal and 9 grams of protein  Beneprotein powder TID, each provides 25 kcals and 6g protein. Encouraged PO intakes and supplements  Fatty liver disease, nonalcoholic Likely diagnosis from recent abdominal ultrasound and more recent CT abdomen/pelvis. Patient would benefit from outpatient hepatology.  Hypoalbuminemia- (present on admission) Possibly secondary to poor nutrition vs liver disease. Stable.  Thrombocytopenia ([place])- (present on admission) Chronic and stable. Likely related to underlying liver disease.     DVT prophylaxis: SCDs Code Status:   Code Status: Full Code Family Communication: Husband on  telephone Disposition Plan: Discharge to SNF when bed is available   Consultants:  Palliative care medicine [facility] Gastroenterology Medical oncology  Psychiatry  Procedures:  UPPER GI ENDOSCOPY (07/12/2021) Impression:               - Normal esophagus.                           - Erythematous mucosa in the gastric body.                           - Erythematous duodenopathy.                           - Non-bleeding duodenal ulcer with a clean ulcer                            base (Forrest Class III).                           - No specimens collected.  Recommendation:           - Resume regular diet.                           - Use Protonix (pantoprazole) 40 mg PO BID for 1                            month.                           - Use sucralfate suspension 1 gram PO QID for 2                            weeks.                           - Stool for H pylori Antigen and celiac serology.  Antimicrobials: Ceftriaxone    Subjective: No new issues. Asking about therapy plan. Eager to improve physical strength to be more independent.  Objective: Vitals:   07/19/21 0600 07/19/21 1303 07/19/21 2141 07/20/21 0512  BP: (!) 89/48 95/71 (!) 133/97 108/62  Pulse: 85 88 93 88  Resp: 17 18 18 18   Temp: 97.7 F (36.5 C) 97.8 F (36.6 C) 98.2 F (36.8 C) (!) 97.5 F (36.4 C)  TempSrc: Oral Oral Oral Oral  SpO2: 100% 100% 100% 100%  Weight:      Height:        Intake/Output Summary (Last 24 hours) at 07/20/2021 1237 Last data filed at 07/20/2021 [id number] Gross per 24 hour  Intake 320 ml  Output 150 ml  Net 170 ml    Filed Weights   07/11/21 0620 07/12/21 1018 07/16/21 0500  Weight: 135.6 kg 135.6 kg 132 kg    Examination:  General exam: Appears calm and comfortable  Respiratory system: Clear to auscultation. Respiratory effort normal. Cardiovascular system: S1 & S2 heard, RRR. No murmurs, rubs, gallops or clicks. Gastrointestinal system: Abdomen is nondistended, soft and  nontender. No organomegaly or masses felt. Normal bowel sounds heard. Central nervous system: Alert and oriented. No focal neurological deficits. Musculoskeletal: Upper/lower extremity edema. No calf tenderness Skin: No cyanosis. Psychiatry: Judgement and insight appear normal. Mood & affect appropriate.  Data Reviewed: I have personally reviewed following labs and imaging studies  CBC Lab Results  Component Value Date   WBC 9.0 07/17/2021   RBC 2.56 (L) 07/17/2021   HGB 7.7 (L) 07/17/2021   HCT 25.7 (L) 07/17/2021   MCV 100.4 (H) 07/17/2021   MCH 30.1 07/17/2021   PLT 88 (L) 07/17/2021   MCHC 30.0 07/17/2021   RDW 24.9 (H) 07/17/2021   LYMPHSABS 3.5 07/17/2021   MONOABS 1.6 (H) 07/17/2021   EOSABS 0.3 07/17/2021   BASOSABS 0.0 [id number]     Last metabolic panel Lab Results  Component Value Date   NA 139 07/17/2021   K 4.0 07/17/2021   CL 112 (H) 07/17/2021   CO2 20 (L) 07/17/2021   BUN 80 (H) 07/17/2021   CREATININE 1.27 (H) 07/17/2021   GLUCOSE 89 07/17/2021   GFRNONAA 44 (L) 07/17/2021   GFRAA 80 (L) 08/05/2013   CALCIUM 7.7 (L) 07/17/2021   PHOS 4.1 07/17/2021   PROT 4.9 (L) 07/17/2021   ALBUMIN 1.9 (L) 07/17/2021   BILITOT 1.1 07/17/2021   ALKPHOS 303 (H) 07/17/2021   AST 15 07/17/2021   ALT 19 07/17/2021   ANIONGAP 7 07/17/2021    CBG (last 3)  No results for input(s): GLUCAP in the last 72 hours.   GFR: Estimated Creatinine Clearance: 53.4 mL/min (A) (by C-G formula based on SCr of 1.27 mg/dL (H)).  Coagulation Profile: Recent Labs  Lab 07/14/21 0446 07/17/21 0416  INR 2.4* 2.4*     Recent Results (from the past 240 hour(s))  Blood Culture (routine x 2)     Status: None   Collection Time: 07/10/21  7:09 PM   Specimen: BLOOD  Result Value Ref Range Status   Specimen Description   Final    BLOOD SITE NOT SPECIFIED Performed at [facility] [address] [place], [place] [address]    Special Requests   Final     BOTTLES DRAWN AEROBIC ONLY Blood Culture results may not be optimal due to an inadequate volume of blood received in culture bottles Performed at [facility] [address], [place], [place] [address]    Culture   Final    NO GROWTH 5 DAYS Performed at [facility], [place] [address] [place], [place] [address]    Report Status 07/15/2021 FINAL  Final         Radiology Studies: MR BRAIN WO CONTRAST  Result Date: 07/19/2021 CLINICAL DATA:  Mental status change. EXAM: MRI HEAD WITHOUT CONTRAST TECHNIQUE: Multiplanar, multiecho pulse sequences of the brain and surrounding structures were obtained without intravenous contrast. COMPARISON:  None. FINDINGS: Brain: There is no evidence of an acute infarct, intracranial hemorrhage, midline shift, or extra-axial fluid collection. Mild cerebral atrophy is within normal limits for age. T2 hyperintensities in the cerebral white matter bilaterally are nonspecific but compatible with mild chronic small vessel ischemic disease. There is mild-to-moderate smooth diffuse dural thickening over both cerebral convexities. No mass is identified on this unenhanced study. Vascular: Major intracranial vascular flow voids are preserved. Skull and upper cervical spine: Diffusely diminished bone marrow T1 signal intensity throughout the skull and included upper cervical spine. Sinuses/Orbits: Bilateral cataract extraction. Extensive circumferential mucosal thickening in the sphenoid sinuses. Mild scattered mucosal thickening elsewhere in the paranasal sinuses. Moderate bilateral mastoid effusions. Other: None. IMPRESSION: 1. No acute infarct. 2. Mild chronic small vessel ischemic disease. 3. Smooth diffuse dural thickening over both cerebral convexities, nonspecific but can be seen  with remote subdural hemorrhage, intracranial hypotension, infection, inflammatory/infiltrative dural processes, and neoplasm. 4. Diffusely abnormal bone marrow signal,  nonspecific but likely related to the patient's known anemia. Infiltrative/myelofibrotic marrow processes and metastatic disease can also give this appearance. Electronically Signed   By: Logan Bores M.D.   On: 07/19/2021 09:20        Scheduled Meds:  (feeding supplement) PROSource Plus  30 mL Oral BID BM   ascorbic acid  500 mg Oral BID   Chlorhexidine Gluconate Cloth  6 each Topical Daily   cholecalciferol  5,000 Units Oral Daily   feeding supplement  237 mL Oral BID BM   folic acid  1 mg Oral Daily   lactobacillus acidophilus & bulgar  2 tablet Oral TID   lactulose  10 g Oral BID   levothyroxine  125 mcg Oral Q0600   mirtazapine  15 mg Oral QHS   multivitamin with minerals  1 tablet Oral Daily   pantoprazole  40 mg Oral BID AC   protein supplement  1 Scoop Oral TID WC   sucralfate  1 g Oral TID WC & HS   Continuous Infusions:     LOS: 9 days     Cordelia Poche, MD [facility] 07/20/2021, 12:37 PM  If 7PM-7AM, please contact night-coverage [email or web address]

## 2021-07-20 NOTE — Progress Notes (Signed)
[name], dx, Anasarca, has BP of  94/38, pulse 71, Now BP 91/50, pulse 68., She's had these BP's before, We'll Monitor, Just letting you know. Denies dizziness or anything different.

## 2021-07-21 DIAGNOSIS — E8809 Other disorders of plasma-protein metabolism, not elsewhere classified: Secondary | ICD-10-CM | POA: Diagnosis not present

## 2021-07-21 DIAGNOSIS — N183 Chronic kidney disease, stage 3 unspecified: Secondary | ICD-10-CM | POA: Diagnosis not present

## 2021-07-21 DIAGNOSIS — G9341 Metabolic encephalopathy: Secondary | ICD-10-CM | POA: Diagnosis not present

## 2021-07-21 DIAGNOSIS — R601 Generalized edema: Secondary | ICD-10-CM | POA: Diagnosis not present

## 2021-07-21 NOTE — Progress Notes (Signed)
PROGRESS NOTE    KYNNEDI DER  [id number] DOB: 07/09/1946 DOA: 07/10/2021 PCP: [name], MD   Brief Narrative: Sally Wise is a 76 y.o. female with a history of atrial fibrillation, hypothyroidism, chronic thrombocytopenia, HFpEF, HLD, HTN, depression. Patient presented secondary to ongoing weakness and poor oral intake. Upper endoscopy significant for non-bleeding duodenal ulcer. Palliative care consulted for goals of care. Plan for discharge to SNF. Medically stable for discharge.   Assessment & Plan:   * Anasarca- (present on admission) Chronic. Likely related to chronic hypoalbuminemia. Initially given IV fluids with albumin. Foley catheter placed on 12/29.  PUD (peptic ulcer disease) EGD with erythematous mucosa in gastric body, erythematous duodenopathy, nonbleeding duodenal ulcer with clean base. H. Pylori and celiac serology pending. GI recommending Protonix 40 mg BID x1 month and sucralfate suspension 1 g PO QID x2 weeks. Reticulin IgA antibody negative. -Re-order h pylori stool ab (pending)  Elevated INR Unclear etiology. Hematology consulted and have recommended IV vitamin K. Treatment completed.  Lack of appetite Unclear cause. Dietitian consulted. Psychiatry consulted. Patient started on Remeron to help with appetite. Per patient, she has no decrease in appetite but has particular eating habits. Patient declines enteral nutrition via feeding tube. GI consulted and patient underwent upper endoscopy with erythematous duodenopathy with associated non-bleeding duodenal ulcer with clean base. -Continue Remeron  Hypothyroidism- (present on admission) Synthroid recently increased from 112 mcg to 125 mcg. TSH slightly improved this admission. -Continue Synthroid -Repeat TSH in 2-3 weeks; increase Synthroid if TSH still elevated  Contamination of blood culture 1 of 2 from 12/28 with staph epidermidis. Likely contaminant.  Acute metabolic  encephalopathy Mild. Per husband, this has been a persistent issue. TSH of 8.6 with normal free T4. Ammonia elevated at 45 > 48 > 63. Vitamin B12 and folate are within normal limits. Cortisol normal with a normal ACTH stim test. Psychiatry evaluated and recommended transition from Lexapro to Remeron. MRI brain without acute process. Chronic ischemic changes noted. Discussed with neurology and findings do not explain patient presentation; dural thickening acknowledged but recommendation for no further workup per neurology unless neurologic symptoms arose.  Pressure ulcer Medial groin, POA  Anemia- (present on admission) Chronic and stable.  Ankle fracture- (present on admission) Left ankle. History of ORIF on recent admission on 05/07/21. Max weight bearing of 75 lbs in boot for one month.  Chronic kidney disease with active medical management without dialysis, stage 3 (moderate) ([place])- (present on admission) Stable. Baseline creatinine of about 1.2  Morbid obesity ([place])- (present on admission) Body mass index is 44.25 kg/m.  Dietitian recommendations: 48 hour Calorie Count Ensure Enlive po BID, each supplement provides 350 kcal and 20 grams of protein Prosource Plus PO BID, each provides 100 kcals and 15g protein Magic cup TID with meals, each supplement provides 290 kcal and 9 grams of protein  Beneprotein powder TID, each provides 25 kcals and 6g protein. Encouraged PO intakes and supplements  Fatty liver disease, nonalcoholic Likely diagnosis from recent abdominal ultrasound and more recent CT abdomen/pelvis. Patient would benefit from outpatient hepatology.  Hypoalbuminemia- (present on admission) Possibly secondary to poor nutrition vs liver disease. Stable.  Thrombocytopenia ([place])- (present on admission) Chronic and stable. Likely related to underlying liver disease.     DVT prophylaxis: SCDs Code Status:   Code Status: Full Code Family Communication: None at  bedside Disposition Plan: Discharge to SNF when bed is available   Consultants:  Palliative care medicine [facility] Gastroenterology Medical oncology  Psychiatry  Procedures:  UPPER GI ENDOSCOPY (07/12/2021) Impression:               - Normal esophagus.                           - Erythematous mucosa in the gastric body.                           - Erythematous duodenopathy.                           - Non-bleeding duodenal ulcer with a clean ulcer                            base (Forrest Class III).                           - No specimens collected.  Recommendation:           - Resume regular diet.                           - Use Protonix (pantoprazole) 40 mg PO BID for 1                            month.                           - Use sucralfate suspension 1 gram PO QID for 2                            weeks.                           - Stool for H pylori Antigen and celiac serology.  Antimicrobials: Ceftriaxone    Subjective: No concerns this afternoon.  Objective: Vitals:   07/20/21 2220 07/21/21 0453 07/21/21 0655 07/21/21 0720  BP: (!) 94/50 (!) 89/43 (!) 81/53 95/61  Pulse: 98 98 97 71  Resp: 16 18 16    Temp: 98.3 F (36.8 C) 98.2 F (36.8 C) 97.6 F (36.4 C)   TempSrc:   Oral   SpO2: 99% 99% 100%   Weight:      Height:        Intake/Output Summary (Last 24 hours) at 07/21/2021 1429 Last data filed at 07/21/2021 1300 Gross per 24 hour  Intake 800 ml  Output --  Net 800 ml    Filed Weights   07/11/21 0620 07/12/21 1018 07/16/21 0500  Weight: 135.6 kg 135.6 kg 132 kg    Examination:  General exam: Appears calm and comfortable Respiratory system: Clear to auscultation. Respiratory effort normal. Cardiovascular system: S1 & S2 heard, RRR. No murmurs, rubs, gallops or clicks. Gastrointestinal system: Abdomen is obese but otherwise nondistended, soft and nontender. No organomegaly or masses felt. Normal bowel sounds heard. Central nervous system: Alert  and oriented. No focal neurological deficits. Musculoskeletal: extremity edema. No calf tenderness Skin: No cyanosis. No rashes Psychiatry: Judgement and insight appear normal. Mood & affect appropriate.     Data Reviewed: I have personally reviewed following labs and imaging studies  CBC  Lab Results  Component Value Date   WBC 9.0 07/17/2021   RBC 2.56 (L) 07/17/2021   HGB 7.7 (L) 07/17/2021   HCT 25.7 (L) 07/17/2021   MCV 100.4 (H) 07/17/2021   MCH 30.1 07/17/2021   PLT 88 (L) 07/17/2021   MCHC 30.0 07/17/2021   RDW 24.9 (H) 07/17/2021   LYMPHSABS 3.5 07/17/2021   MONOABS 1.6 (H) 07/17/2021   EOSABS 0.3 07/17/2021   BASOSABS 0.0 [id number]     Last metabolic panel Lab Results  Component Value Date   NA 139 07/17/2021   K 4.0 07/17/2021   CL 112 (H) 07/17/2021   CO2 20 (L) 07/17/2021   BUN 80 (H) 07/17/2021   CREATININE 1.27 (H) 07/17/2021   GLUCOSE 89 07/17/2021   GFRNONAA 44 (L) 07/17/2021   GFRAA 80 (L) 08/05/2013   CALCIUM 7.7 (L) 07/17/2021   PHOS 4.1 07/17/2021   PROT 4.9 (L) 07/17/2021   ALBUMIN 1.9 (L) 07/17/2021   BILITOT 1.1 07/17/2021   ALKPHOS 303 (H) 07/17/2021   AST 15 07/17/2021   ALT 19 07/17/2021   ANIONGAP 7 07/17/2021    CBG (last 3)  No results for input(s): GLUCAP in the last 72 hours.   GFR: Estimated Creatinine Clearance: 53.4 mL/min (A) (by C-G formula based on SCr of 1.27 mg/dL (H)).  Coagulation Profile: Recent Labs  Lab 07/17/21 0416  INR 2.4*     No results found for this or any previous visit (from the past 240 hour(s)).        Radiology Studies: No results found.      Scheduled Meds:  (feeding supplement) PROSource Plus  30 mL Oral BID BM   ascorbic acid  500 mg Oral BID   Chlorhexidine Gluconate Cloth  6 each Topical Daily   cholecalciferol  5,000 Units Oral Daily   feeding supplement  237 mL Oral BID BM   folic acid  1 mg Oral Daily   lactobacillus acidophilus & bulgar  2 tablet Oral TID    lactulose  10 g Oral BID   levothyroxine  125 mcg Oral Q0600   mirtazapine  15 mg Oral QHS   multivitamin with minerals  1 tablet Oral Daily   pantoprazole  40 mg Oral BID AC   protein supplement  1 Scoop Oral TID WC   sucralfate  1 g Oral TID WC & HS   Continuous Infusions:     LOS: 10 days     Cordelia Poche, MD [facility] 07/21/2021, 2:29 PM  If 7PM-7AM, please contact night-coverage [email or web address]

## 2021-07-22 DIAGNOSIS — R601 Generalized edema: Secondary | ICD-10-CM | POA: Diagnosis not present

## 2021-07-22 LAB — BASIC METABOLIC PANEL
Anion gap: 9 (ref 5–15)
BUN: 89 mg/dL — ABNORMAL HIGH (ref 8–23)
CO2: 18 mmol/L — ABNORMAL LOW (ref 22–32)
Calcium: 7.5 mg/dL — ABNORMAL LOW (ref 8.9–10.3)
Chloride: 111 mmol/L (ref 98–111)
Creatinine, Ser: 1.82 mg/dL — ABNORMAL HIGH (ref 0.44–1.00)
GFR, Estimated: 28 mL/min — ABNORMAL LOW (ref >60)
Glucose, Bld: 117 mg/dL — ABNORMAL HIGH (ref 70–99)
Potassium: 4.5 mmol/L (ref 3.5–5.1)
Sodium: 138 mmol/L (ref 135–145)

## 2021-07-22 LAB — CBC
HCT: 25.3 % — ABNORMAL LOW (ref 36.0–46.0)
Hemoglobin: 7.8 g/dL — ABNORMAL LOW (ref 12.0–15.0)
MCH: 30.5 pg (ref 26.0–34.0)
MCHC: 30.8 g/dL (ref 30.0–36.0)
MCV: 98.8 fL (ref 80.0–100.0)
Platelets: 86 10*3/uL — ABNORMAL LOW (ref 150–400)
RBC: 2.56 MIL/uL — ABNORMAL LOW (ref 3.87–5.11)
RDW: 25.2 % — ABNORMAL HIGH (ref 11.5–15.5)
WBC: 12.1 10*3/uL — ABNORMAL HIGH (ref 4.0–10.5)
nRBC: 0.5 % — ABNORMAL HIGH (ref 0.0–0.2)

## 2021-07-22 NOTE — TOC Progression Note (Addendum)
Transition of Care [facility]) - Progression Note    Patient Details  Name: KAIYAH EBER MRN: [id number] Date of Birth: Sep 19, 1945  Transition of Care [facility]) CM/SW Contact  Amada Jupiter, LCSW Phone Number: 07/22/2021, 2:48 PM  Clinical Narrative:    Continue without any SNF/ LTC bed offers.  Have updated pt/ spouse and have widened search area.   Have submitted for insurance authorization based on medical need only per [facility] request.  If we can get approval, facility able to consider her return.   Expected Discharge Plan: Skilled Nursing Facility Barriers to Discharge: Continued Medical Work up  Expected Discharge Plan and Services Expected Discharge Plan: Skilled Nursing Facility   Discharge Planning Services: CM Consult Post Acute Care Choice: Skilled Nursing Facility Living arrangements for the past 2 months: Skilled Nursing Facility                                       Social Determinants of Health (SDOH) Interventions    Readmission Risk Interventions Readmission Risk Prevention Plan 07/12/2021 06/08/2020  Post Dischage Appt - Complete  Medication Screening - Complete  Transportation Screening Complete Complete  PCP or Specialist Appt within 3-5 Days Complete -  HRI or Home Care Consult Complete -  Social Work Consult for Recovery Care Planning/Counseling Complete -  Palliative Care Screening Not Applicable -  Medication Review [facility]) Complete -  Some recent data might be hidden

## 2021-07-22 NOTE — Progress Notes (Signed)
PROGRESS NOTE    Sally Wise  [id number] DOB: 1946-01-15 DOA: 07/10/2021 PCP: [name], MD   Brief Narrative: Sally Wise is a 76 y.o. female with a history of atrial fibrillation, hypothyroidism, chronic thrombocytopenia, HFpEF, HLD, HTN, depression. Patient presented secondary to ongoing weakness and poor oral intake. Upper endoscopy significant for non-bleeding duodenal ulcer. Palliative care consulted for goals of care. Plan for discharge to SNF. Medically stable for discharge.   Assessment & Plan:   * Anasarca- (present on admission) Chronic. Likely related to chronic hypoalbuminemia. Initially given IV fluids with albumin. Foley catheter placed on 12/29.  PUD (peptic ulcer disease) EGD with erythematous mucosa in gastric body, erythematous duodenopathy, nonbleeding duodenal ulcer with clean base. H. Pylori and celiac serology pending. GI recommending Protonix 40 mg BID x1 month and sucralfate suspension 1 g PO QID x2 weeks. Reticulin IgA antibody negative. -Re-order h pylori stool ab (pending)  Elevated INR Unclear etiology. Hematology consulted and have recommended IV vitamin K. Treatment completed.  Lack of appetite Unclear cause. Dietitian consulted. Psychiatry consulted. Patient started on Remeron to help with appetite. Per patient, she has no decrease in appetite but has particular eating habits. Patient declines enteral nutrition via feeding tube. GI consulted and patient underwent upper endoscopy with erythematous duodenopathy with associated non-bleeding duodenal ulcer with clean base. -Continue Remeron  Hypothyroidism- (present on admission) Synthroid recently increased from 112 mcg to 125 mcg. TSH slightly improved this admission. -Continue Synthroid -Repeat TSH in 2-3 weeks; increase Synthroid if TSH still elevated  Contamination of blood culture 1 of 2 from 12/28 with staph epidermidis. Likely contaminant.  Acute metabolic  encephalopathy Mild. Per husband, this has been a persistent issue. TSH of 8.6 with normal free T4. Ammonia elevated at 45 > 48 > 63. Vitamin B12 and folate are within normal limits. Cortisol normal with a normal ACTH stim test. Psychiatry evaluated and recommended transition from Lexapro to Remeron. MRI brain without acute process. Chronic ischemic changes noted. Discussed with neurology and findings do not explain patient presentation; dural thickening acknowledged but recommendation for no further workup per neurology unless neurologic symptoms arose.  Pressure ulcer Medial groin, POA  Anemia- (present on admission) Chronic and stable.  Ankle fracture- (present on admission) Left ankle. History of ORIF on recent admission on 05/07/21. Max weight bearing of 75 lbs in boot for one month.  Chronic kidney disease with active medical management without dialysis, stage 3 (moderate) ([place])- (present on admission) Stable. Baseline creatinine of about 1.2  Morbid obesity ([place])- (present on admission) Body mass index is 44.25 kg/m.  Dietitian recommendations: 48 hour Calorie Count Ensure Enlive po BID, each supplement provides 350 kcal and 20 grams of protein Prosource Plus PO BID, each provides 100 kcals and 15g protein Magic cup TID with meals, each supplement provides 290 kcal and 9 grams of protein  Beneprotein powder TID, each provides 25 kcals and 6g protein. Encouraged PO intakes and supplements  Fatty liver disease, nonalcoholic Likely diagnosis from recent abdominal ultrasound and more recent CT abdomen/pelvis. Patient would benefit from outpatient hepatology.  Hypoalbuminemia- (present on admission) Possibly secondary to poor nutrition vs liver disease. Stable.  Thrombocytopenia ([place])- (present on admission) Chronic and stable. Likely related to underlying liver disease.     DVT prophylaxis: SCDs Code Status:   Code Status: Full Code Family Communication: None at  bedside Disposition Plan: Discharge to SNF when bed is available   Consultants:  Palliative care medicine [facility] Medical oncology  Psychiatry  Procedures:  UPPER GI ENDOSCOPY (07/12/2021) Impression:               - Normal esophagus.                           - Erythematous mucosa in the gastric body.                           - Erythematous duodenopathy.                           - Non-bleeding duodenal ulcer with a clean ulcer                            base (Forrest Class III).                           - No specimens collected.  Recommendation:           - Resume regular diet.                           - Use Protonix (pantoprazole) 40 mg PO BID for 1                            month.                           - Use sucralfate suspension 1 gram PO QID for 2                            weeks.                           - Stool for H pylori Antigen and celiac serology.  Antimicrobials: Ceftriaxone    Subjective: No issues noted overnight  Objective: Vitals:   07/21/21 1506 07/21/21 2110 07/22/21 0428 07/22/21 0514  BP: 96/65 (!) 112/51  91/72  Pulse:  89 85 84  Resp:  18    Temp:  97.6 F (36.4 C) 97.7 F (36.5 C)   TempSrc:  Oral Oral   SpO2:  98% 99%   Weight:      Height:        Intake/Output Summary (Last 24 hours) at 07/22/2021 0900 Last data filed at 07/22/2021 0600 Gross per 24 hour  Intake 600 ml  Output 0 ml  Net 600 ml    Filed Weights   07/11/21 0620 07/12/21 1018 07/16/21 0500  Weight: 135.6 kg 135.6 kg 132 kg    Examination:  General exam: Appears calm and comfortable  Respiratory system: Respiratory effort normal.    Data Reviewed: I have personally reviewed following labs and imaging studies  CBC Lab Results  Component Value Date   WBC 9.0 07/17/2021   RBC 2.56 (L) 07/17/2021   HGB 7.7 (L) 07/17/2021   HCT 25.7 (L) 07/17/2021   MCV 100.4 (H) 07/17/2021   MCH 30.1 07/17/2021   PLT 88 (L) 07/17/2021   MCHC 30.0  07/17/2021   RDW 24.9 (H) 07/17/2021   LYMPHSABS 3.5 07/17/2021   MONOABS 1.6 (H) 07/17/2021   EOSABS  0.3 07/17/2021   BASOSABS 0.0 [id number]     Last metabolic panel Lab Results  Component Value Date   NA 139 07/17/2021   K 4.0 07/17/2021   CL 112 (H) 07/17/2021   CO2 20 (L) 07/17/2021   BUN 80 (H) 07/17/2021   CREATININE 1.27 (H) 07/17/2021   GLUCOSE 89 07/17/2021   GFRNONAA 44 (L) 07/17/2021   GFRAA 80 (L) 08/05/2013   CALCIUM 7.7 (L) 07/17/2021   PHOS 4.1 07/17/2021   PROT 4.9 (L) 07/17/2021   ALBUMIN 1.9 (L) 07/17/2021   BILITOT 1.1 07/17/2021   ALKPHOS 303 (H) 07/17/2021   AST 15 07/17/2021   ALT 19 07/17/2021   ANIONGAP 7 07/17/2021    CBG (last 3)  No results for input(s): GLUCAP in the last 72 hours.   GFR: Estimated Creatinine Clearance: 53.4 mL/min (A) (by C-G formula based on SCr of 1.27 mg/dL (H)).  Coagulation Profile: Recent Labs  Lab 07/17/21 0416  INR 2.4*     No results found for this or any previous visit (from the past 240 hour(s)).        Radiology Studies: No results found.      Scheduled Meds:  (feeding supplement) PROSource Plus  30 mL Oral BID BM   ascorbic acid  500 mg Oral BID   Chlorhexidine Gluconate Cloth  6 each Topical Daily   cholecalciferol  5,000 Units Oral Daily   feeding supplement  237 mL Oral BID BM   folic acid  1 mg Oral Daily   lactobacillus acidophilus & bulgar  2 tablet Oral TID   lactulose  10 g Oral BID   levothyroxine  125 mcg Oral Q0600   mirtazapine  15 mg Oral QHS   multivitamin with minerals  1 tablet Oral Daily   pantoprazole  40 mg Oral BID AC   protein supplement  1 Scoop Oral TID WC   sucralfate  1 g Oral TID WC & HS   Continuous Infusions:     LOS: 11 days     Cordelia Poche, MD [facility] 07/22/2021, 9:00 AM  If 7PM-7AM, please contact night-coverage [email or web address]

## 2021-07-23 ENCOUNTER — Inpatient Hospital Stay (HOSPITAL_COMMUNITY): Payer: Medicare Other

## 2021-07-23 DIAGNOSIS — R601 Generalized edema: Secondary | ICD-10-CM | POA: Diagnosis not present

## 2021-07-23 DIAGNOSIS — E8809 Other disorders of plasma-protein metabolism, not elsewhere classified: Secondary | ICD-10-CM | POA: Diagnosis not present

## 2021-07-23 DIAGNOSIS — N183 Chronic kidney disease, stage 3 unspecified: Secondary | ICD-10-CM | POA: Diagnosis not present

## 2021-07-23 DIAGNOSIS — N179 Acute kidney failure, unspecified: Secondary | ICD-10-CM

## 2021-07-23 DIAGNOSIS — G9341 Metabolic encephalopathy: Secondary | ICD-10-CM | POA: Diagnosis not present

## 2021-07-23 MED ORDER — FUROSEMIDE 10 MG/ML IJ SOLN
20.0000 mg | Freq: Once | INTRAMUSCULAR | Status: AC
  Administered 2021-07-23: 20 mg via INTRAVENOUS
  Filled 2021-07-23: qty 2

## 2021-07-23 NOTE — Progress Notes (Signed)
PROGRESS NOTE    [name]  [id number] DOB: 1945/12/01 DOA: 07/10/2021 PCP: [name], MD   Brief Narrative: Sally Wise is a 76 y.o. female with a history of atrial fibrillation, hypothyroidism, chronic thrombocytopenia, HFpEF, HLD, HTN, depression. Patient presented secondary to ongoing weakness and poor oral intake. Upper endoscopy significant for non-bleeding duodenal ulcer. Palliative care consulted for goals of care. Plan for discharge to SNF. Medically stable for discharge.   Assessment & Plan:   * Anasarca- (present on admission) Chronic. Likely related to chronic hypoalbuminemia. Initially given IV fluids with albumin. Foley catheter placed on 07/11/2021 and removed on 07/19/2021.  PUD (peptic ulcer disease) EGD with erythematous mucosa in gastric body, erythematous duodenopathy, nonbleeding duodenal ulcer with clean base. H. Pylori and celiac serology pending. GI recommending Protonix 40 mg BID x1 month and sucralfate suspension 1 g PO QID x2 weeks. Reticulin IgA antibody negative. -Re-order h pylori stool ab (pending)  Elevated INR Unclear etiology. Hematology consulted and have recommended IV vitamin K. Treatment completed.  Lack of appetite Unclear cause. Dietitian consulted. Psychiatry consulted. Patient started on Remeron to help with appetite. Per patient, she has no decrease in appetite but has particular eating habits. Patient declines enteral nutrition via feeding tube. GI consulted and patient underwent upper endoscopy with erythematous duodenopathy with associated non-bleeding duodenal ulcer with clean base. -Continue Remeron  Hypothyroidism- (present on admission) Synthroid recently increased from 112 mcg to 125 mcg. TSH slightly improved this admission. -Continue Synthroid -Repeat TSH in 2-3 weeks; increase Synthroid if TSH still elevated  Contamination of blood culture 1 of 2 from 12/28 with staph epidermidis. Likely  contaminant.  Acute metabolic encephalopathy Mild. Per husband, this has been a persistent issue. TSH of 8.6 with normal free T4. Ammonia elevated at 45 > 48 > 63. Vitamin B12 and folate are within normal limits. Cortisol normal with a normal ACTH stim test. Psychiatry evaluated and recommended transition from Lexapro to Remeron. MRI brain without acute process. Chronic ischemic changes noted. Discussed with neurology and findings do not explain patient presentation; dural thickening acknowledged but recommendation for no further workup per neurology unless neurologic symptoms arose.  Pressure ulcer Medial groin, POA  Anemia- (present on admission) Chronic and stable.  Ankle fracture- (present on admission) Left ankle. History of ORIF on recent admission on 05/07/21. Max weight bearing of 75 lbs in boot for one month.  Chronic kidney disease with active medical management without dialysis, stage 3 (moderate) ([place])- (present on admission) Baseline creatinine of about 1.2  Morbid obesity ([place])- (present on admission) Body mass index is 44.25 kg/m.  Dietitian recommendations: 48 hour Calorie Count Ensure Enlive po BID, each supplement provides 350 kcal and 20 grams of protein Prosource Plus PO BID, each provides 100 kcals and 15g protein Magic cup TID with meals, each supplement provides 290 kcal and 9 grams of protein  Beneprotein powder TID, each provides 25 kcals and 6g protein. Encouraged PO intakes and supplements  AKI (acute kidney injury) ([place]) Creatinine up to 1.8. Likely secondary to poor oral intake, but complicated with inability to document accurate urine output in addition to anasarca. Unable to obtain daily weights secondary to lack of bed function but will double check. Patient unable to perform standing weights -Renal ultrasound to rule out retention etiology -BMP in AM  Fatty liver disease, nonalcoholic Likely diagnosis from recent abdominal ultrasound and more recent  CT abdomen/pelvis. Patient would benefit from outpatient hepatology.  Hypoalbuminemia- (present on admission) Possibly  secondary to poor nutrition vs liver disease. Stable.  Thrombocytopenia ([place])- (present on admission) Chronic and stable. Likely related to underlying liver disease.     DVT prophylaxis: SCDs Code Status:   Code Status: Full Code Family Communication: Husband at bedside Disposition Plan: Discharge to SNF when bed is available   Consultants:  Palliative care medicine [facility] Gastroenterology Medical oncology Psychiatry  Procedures:  UPPER GI ENDOSCOPY (07/12/2021) Impression:               - Normal esophagus.                           - Erythematous mucosa in the gastric body.                           - Erythematous duodenopathy.                           - Non-bleeding duodenal ulcer with a clean ulcer                            base (Forrest Class III).                           - No specimens collected.  Recommendation:           - Resume regular diet.                           - Use Protonix (pantoprazole) 40 mg PO BID for 1                            month.                           - Use sucralfate suspension 1 gram PO QID for 2                            weeks.                           - Stool for H pylori Antigen and celiac serology.  Antimicrobials: Ceftriaxone    Subjective: No issues this morning. Patient states she doesn't know if she urinated. Nursing states patient is incontinent but has been producing urine.  Objective: Vitals:   07/22/21 0514 07/22/21 1334 07/22/21 2112 07/23/21 0605  BP: 91/72 97/64 117/80 (!) 107/50  Pulse: 84 82 85 90  Resp:  16 16 18   Temp:   (!) 97.5 F (36.4 C) (!) 97.5 F (36.4 C)  TempSrc:    Oral  SpO2:  97% 96% 100%  Weight:      Height:        Intake/Output Summary (Last 24 hours) at 07/23/2021 1037 Last data filed at 07/23/2021 0600 Gross per 24 hour  Intake 420 ml  Output 0 ml  Net 420 ml     Filed Weights   07/11/21 0620 07/12/21 1018 07/16/21 0500  Weight: 135.6 kg 135.6 kg 132 kg    Examination:  General exam: Appears calm and comfortable Respiratory system: Clear to auscultation. Respiratory effort normal. Cardiovascular system:  S1 & S2 heard, RRR. No murmurs, rubs, gallops or clicks. Gastrointestinal system: Abdomen is nondistended, soft and nontender. No organomegaly or masses felt. Normal bowel sounds heard. Central nervous system: Alert and oriented. No focal neurological deficits. Musculoskeletal: 1+ pitting edema. No calf tenderness Skin: No cyanosis. No rashes Psychiatry: Flat affect. Appears depressed   Data Reviewed: I have personally reviewed following labs and imaging studies  CBC Lab Results  Component Value Date   WBC 12.1 (H) 07/22/2021   RBC 2.56 (L) 07/22/2021   HGB 7.8 (L) 07/22/2021   HCT 25.3 (L) 07/22/2021   MCV 98.8 07/22/2021   MCH 30.5 07/22/2021   PLT 86 (L) 07/22/2021   MCHC 30.8 07/22/2021   RDW 25.2 (H) 07/22/2021   LYMPHSABS 3.5 07/17/2021   MONOABS 1.6 (H) 07/17/2021   EOSABS 0.3 07/17/2021   BASOSABS 0.0 [id number]     Last metabolic panel Lab Results  Component Value Date   NA 138 07/22/2021   K 4.5 07/22/2021   CL 111 07/22/2021   CO2 18 (L) 07/22/2021   BUN 89 (H) 07/22/2021   CREATININE 1.82 (H) 07/22/2021   GLUCOSE 117 (H) 07/22/2021   GFRNONAA 28 (L) 07/22/2021   GFRAA 80 (L) 08/05/2013   CALCIUM 7.5 (L) 07/22/2021   PHOS 4.1 07/17/2021   PROT 4.9 (L) 07/17/2021   ALBUMIN 1.9 (L) 07/17/2021   BILITOT 1.1 07/17/2021   ALKPHOS 303 (H) 07/17/2021   AST 15 07/17/2021   ALT 19 07/17/2021   ANIONGAP 9 07/22/2021    CBG (last 3)  No results for input(s): GLUCAP in the last 72 hours.   GFR: Estimated Creatinine Clearance: 37.2 mL/min (A) (by C-G formula based on SCr of 1.82 mg/dL (H)).  Coagulation Profile: Recent Labs  Lab 07/17/21 0416  INR 2.4*     No results found for this or any previous  visit (from the past 240 hour(s)).        Radiology Studies: No results found.      Scheduled Meds:  (feeding supplement) PROSource Plus  30 mL Oral BID BM   ascorbic acid  500 mg Oral BID   Chlorhexidine Gluconate Cloth  6 each Topical Daily   cholecalciferol  5,000 Units Oral Daily   feeding supplement  237 mL Oral BID BM   folic acid  1 mg Oral Daily   lactobacillus acidophilus & bulgar  2 tablet Oral TID   lactulose  10 g Oral BID   levothyroxine  125 mcg Oral Q0600   mirtazapine  15 mg Oral QHS   multivitamin with minerals  1 tablet Oral Daily   pantoprazole  40 mg Oral BID AC   protein supplement  1 Scoop Oral TID WC   sucralfate  1 g Oral TID WC & HS   Continuous Infusions:     LOS: 12 days     Cordelia Poche, MD [facility] 07/23/2021, 10:37 AM  If 7PM-7AM, please contact night-coverage [email or web address]

## 2021-07-23 NOTE — Assessment & Plan Note (Addendum)
Creatinine fluctuating Appreciate renals assistance in setting of hypotension, anasarca Low urine sodium Diuresis on hold Renal US without hydro

## 2021-07-23 NOTE — TOC Progression Note (Addendum)
Transition of Care [facility]) - Progression Note    Patient Details  Name: Sally Wise MRN: [id number] Date of Birth: 1946/03/10  Transition of [facility]) CM/SW Contact  Darleene Cleaver, [place] Phone Number: 07/23/2021, 10:55 AM  Clinical Narrative:    10:55am  Updated clinicals sent to [facility] for insurance authorization for [facility] medical need.  2:00pm  [employer] sent message on portal that they are offering a peer to peer.  CSW also informed Fransico Him that patient had a PT eval today and also CSW sent updated FL2 for daily wound care.  [employer] confirmed they received updated clinicals.      CSW notified attending physician and provided the contact information for peer to peer:  [phone], Option 5 policy number [id number]    Per Fransico Him the deadline for peer to peer is 11am on Wednesday 07/24/21.  6:15pm  CSW received message from attending physician, that Dr. Manson Passey Medical Director at [facility] denied patient for SNF, due to recommendations for LTC.  TOC to follow up with family tomorrow regarding next step of appealing decision by insurance company or paying privately.      CSW attempt to call [facility] to update them on status of insurance authorization, CSW had to leave a message on voice mail.  Expected Discharge Plan: Skilled Nursing Facility Barriers to Discharge: Continued Medical Work up  Expected Discharge Plan and Services Expected Discharge Plan: Skilled Nursing Facility   Discharge Planning Services: CM Consult Post Acute Care Choice: Skilled Nursing Facility Living arrangements for the past 2 months: Skilled Nursing Facility                                       Social Determinants of Health (SDOH) Interventions    Readmission Risk Interventions Readmission Risk Prevention Plan 07/12/2021 06/08/2020  Post Dischage Appt - Complete  Medication Screening - Complete  Transportation Screening Complete Complete  PCP or Specialist  Appt within 3-5 Days Complete -  HRI or Home Care Consult Complete -  Social Work Consult for Recovery Care Planning/Counseling Complete -  Palliative Care Screening Not Applicable -  Medication Review Oceanographer) Complete -  Some recent data might be hidden

## 2021-07-23 NOTE — Progress Notes (Signed)
Completed peer-to-peer. Dr. Manson Passey. Denied for SNF. One of three recommendations was discharge to long term care with inpatient intermittent therapy. Information relayed to [facility] via secure chat after hours.  Jacquelin Hawking, MD [facility] 07/23/2021, 6:18 PM

## 2021-07-23 NOTE — Care Management Important Message (Signed)
Important Message  Patient Details IM Letter placed in Patients room. Name: Sally Wise MRN: [id number] Date of Birth: Jan 31, 1946   Medicare Important Message Given:  Yes     [name] 07/23/2021, 11:59 AM

## 2021-07-23 NOTE — Evaluation (Signed)
Physical Therapy Evaluation Patient Details Name: Sally Wise MRN: [id number] DOB: 10/22/1945 Today's Date: 07/23/2021  History of Present Illness  Sally Wise is a 76 y.o. female who presents secondary to ongoing weakness and poor oral intake. Since d/c from her ankle fracture, patient has been in a SNF [facility], then [place]) functionally declining since ankle fx. PMH: paroxysmal afib, hypothyroidism, anemia/thrombocytopenia, depression/anxiety, morbid obesity, recent left ankle fracture s/p ORIF 05/07/2021   Clinical Impression  Pt admitted with above diagnosis. Pt from SNF since Oct 2022 after L ankle fracture. Pt currently mobilizing L ankle ~25%, limited knee and hip AAROM/PROM due to body habitus. Pt reports requiring assist for self care tasks, using hoyer to transfer and mobilizing in w/c at SNF, has not been able to ambulate. Pt able to reach for bedrail with cues to begin rotating trunk into sidelying, unable to achieve full sidelying position with +1 assist. Pt denies pain with all mobility, reports fatiguing easily limiting mobility. Pt answers a&o questions appropriately, though appears to be unaware of currently being in hospital for 13 days and at [facility] prior. Pt would benefit from SNF for strengthening, AROM, mobility education, ultimately may need LTC due to slow progress complicated by 2 hospitalizations. Pt currently with functional limitations due to the deficits listed below (see PT Problem List). Pt will benefit from skilled PT to increase their independence and safety with mobility to allow discharge to the venue listed below.          Recommendations for follow up therapy are one component of a multi-disciplinary discharge planning process, led by the attending physician.  Recommendations may be updated based on patient status, additional functional criteria and insurance authorization.  Follow Up Recommendations Skilled nursing-short term rehab (<3 hours/day)     Assistance Recommended at Discharge Frequent or constant Supervision/Assistance  Patient can return home with the following       Equipment Recommendations None recommended by PT  Recommendations for Other Services       Functional Status Assessment       Precautions / Restrictions Precautions Precautions: Fall Required Braces or Orthoses: Other Brace Other Brace: camboot on L Restrictions Weight Bearing Restrictions: Yes LLE Weight Bearing: Partial weight bearing LLE Partial Weight Bearing Percentage or Pounds: 75#      Mobility  Bed Mobility Overal bed mobility: Needs Assistance Bed Mobility: Rolling Rolling: +2 for physical assistance  General bed mobility comments: pt able to reach for bedrail with RUE when cued to cross body, pt pulls on bedrail to assist in rotating trunk, unable to achieve full sidelying position with +1 assist    Transfers    Ambulation/Gait   Stairs            Wheelchair Mobility    Modified Rankin (Stroke Patients Only)       Balance       Pertinent Vitals/Pain Pain Assessment: No/denies pain    Home Living Family/patient expects to be discharged to:: Skilled nursing facility    Prior Function Prior Level of Function : Needs assist  Physical Assist : Mobility (physical);ADLs (physical) Mobility (physical): Bed mobility;Transfers ADLs (physical): Bathing;Dressing;Toileting Mobility Comments: using hoyer lift to transfer to w/c, hasn't ambulated since original surgery Oct 2022; at SNF since surgery where they assist with self care tasks       Hand Dominance        Extremity/Trunk Assessment   Upper Extremity Assessment Upper Extremity Assessment: Defer to OT evaluation    Lower  Extremity Assessment Lower Extremity Assessment: Generalized weakness;RLE deficits/detail;LLE deficits/detail RLE Deficits / Details: ankle AROM ~25%. AA/PROM knee and hip limtied by body habitus; grossly 2-/5; resting in end range hip  abduction, able to come to neutral hip with assist RLE Sensation: WNL LLE Deficits / Details: ankle AROM ~25%. AA/PROM knee and hip limited by body habitus; grossly 2-/5. resting in end range hip abduction, able to come to neutral hip with assist LLE Sensation: WNL    Cervical / Trunk Assessment Cervical / Trunk Assessment: Other exceptions Cervical / Trunk Exceptions: large body habitus  Communication   Communication: No difficulties  Cognition Arousal/Alertness: Awake/alert Behavior During Therapy: WFL for tasks assessed/performed;Flat affect Overall Cognitive Status: Within Functional Limits for tasks assessed  General Comments: pt a&o x3, flat affect, no family present. Pt reports it is January 2023, but has difficulty understanding has currently been in hospital for 13 days and at rehab prior, believes she is still here since her ankle fracture/ORIF Oct 2022        General Comments      Exercises     Assessment/Plan    PT Assessment Patient needs continued PT services  PT Problem List Decreased strength;Decreased range of motion;Decreased activity tolerance;Decreased balance;Decreased mobility;Decreased knowledge of use of DME;Cardiopulmonary status limiting activity;Obesity       PT Treatment Interventions DME instruction;Functional mobility training;Therapeutic activities;Therapeutic exercise;Balance training;Patient/family education    PT Goals (Current goals can be found in the Care Plan section)  Acute Rehab PT Goals Patient Stated Goal: "not be so tired" PT Goal Formulation: With patient Time For Goal Achievement: 08/06/21 Potential to Achieve Goals: Poor    Frequency Min 2X/week     Co-evaluation               AM-PAC PT "6 Clicks" Mobility  Outcome Measure Help needed turning from your back to your side while in a flat bed without using bedrails?: Total Help needed moving from lying on your back to sitting on the side of a flat bed without using  bedrails?: Total Help needed moving to and from a bed to a chair (including a wheelchair)?: Total Help needed standing up from a chair using your arms (e.g., wheelchair or bedside chair)?: Total Help needed to walk in hospital room?: Total Help needed climbing 3-5 steps with a railing? : Total 6 Click Score: 6    End of Session   Activity Tolerance: Patient tolerated treatment well;Patient limited by fatigue Patient left: in bed;with call bell/phone within reach Nurse Communication: Mobility status;Need for lift equipment PT Visit Diagnosis: Muscle weakness (generalized) (M62.81);Other abnormalities of gait and mobility (R26.89)    Time: [id number] PT Time Calculation (min) (ACUTE ONLY): 13 min   Charges:   PT Evaluation $PT Eval Low Complexity: 1 Low           Tori Seraphim Affinito PT, DPT 07/23/21, 12:53 PM

## 2021-07-23 NOTE — NC FL2 (Signed)
[place] LEVEL OF CARE SCREENING TOOL     IDENTIFICATION  Patient Name: Sally Wise Birthdate: 10/25/1945 Sex: female Admission Date (Current Location): 07/10/2021  [facility] : [facility] and [place] Number:  Herbalist and Address:  [facility],  [place], [place]      Provider Number: [id number]  Attending Physician Name and Address:  Mariel Aloe, MD  Relative Name and Phone Number:  James(spouse) [phone]    Current Level of Care: Hospital Recommended Level of Care: [place] Prior Approval Number:    Date Approved/Denied:   PASRR Number: [id number]  Discharge Plan: SNF    Current Diagnoses: Patient Active Problem List   Diagnosis Date Noted   AKI (acute kidney injury) ([place]) [id number]   Acute metabolic encephalopathy [id number]   Lack of appetite 07/14/2021   PUD (peptic ulcer disease) 07/14/2021   Elevated INR 07/14/2021   Fatty liver disease, nonalcoholic XX123456   Contamination of blood culture 07/14/2021   Anasarca 07/11/2021   History of COVID-19 06/29/2021   Foley catheter in place 06/29/2021   Failure to thrive in adult 06/27/2021   Hypoalbuminemia 06/27/2021   Depression 06/27/2021   CAP (community acquired pneumonia) 06/27/2021   Pleural effusion 06/27/2021   Daytime somnolence 06/27/2021   Pressure ulcer 06/25/2021   Acute renal failure superimposed on stage 3a chronic kidney disease ([place]) 06/24/2021   Hypotension 06/24/2021   Ankle fracture 04/28/2021   Chronic diastolic CHF (congestive heart failure) ([place]) 04/28/2021   Anemia 04/28/2021   Prolonged QT interval 04/28/2021   Mucoid diarrhea 03/16/2021   Diarrhea 03/14/2021   Acute on chronic diastolic HF (heart failure) ([place]) 08/24/2020   Obesity, Class III, BMI 40-49.9 (morbid obesity) ([place]) 08/24/2020   Chronic kidney disease with active medical management without dialysis, stage 3 (moderate) ([place]) 08/24/2020   Combined  rheumatic disorders of mitral, 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   Congestive rheumatic heart failure ([place]) 08/24/2020   Hardening of the aorta (main artery of the heart) ([place]) 08/24/2020   Kidney stone 08/24/2020   Pure hypercholesterolemia 08/24/2020   Scoliosis of thoracic spine 08/24/2020   Solitary pulmonary nodule 08/24/2020   Other long term (current) drug therapy 08/24/2020   Abnormal findings on diagnostic imaging of other specified body structures 08/24/2020   Acute respiratory failure with hypoxia ([place]) 06/04/2020   Multifocal pneumonia 06/03/2020   Low back pain 12/12/2019   History of total knee replacement, right 0000000   Lichen sclerosus et atrophicus 01/04/2015   LVH (left ventricular hypertrophy)    PAF (paroxysmal atrial fibrillation) ([place]) 07/06/2014   Thrombocytopenia ([place]) 07/06/2014   Hyperglycemia 07/06/2014   Morbid obesity ([place]) 01/25/2013   Hypothyroidism 01/15/2013   History of total knee replacement, left 08/09/2007    Orientation RESPIRATION BLADDER Height & Weight     Self, Time, Situation, Place  Normal Incontinent Weight: (!) 372 lb (168.7 kg) Height:  5\' 8"  (172.7 cm)  BEHAVIORAL SYMPTOMS/MOOD NEUROLOGICAL BOWEL NUTRITION STATUS      Continent Diet (Regular diet)  AMBULATORY STATUS COMMUNICATION OF NEEDS Skin   Limited Assist (hoyer lift) Verbally PU Stage and Appropriate Care, Other (Comment) (Unstageable daily dressing changes.) PU Stage 1 Dressing:  (PRN dressing changes)   PU Stage 3 Dressing: Daily (Daily dressing changes)                 Personal Care Assistance Level of Assistance  Bathing, Feeding, Dressing Bathing Assistance: Limited assistance Feeding assistance:  Limited assistance Dressing Assistance: Limited assistance     Functional Limitations Info  Sight, Hearing, Speech Sight Info: Impaired Hearing Info: Adequate Speech Info: Adequate    SPECIAL CARE FACTORS FREQUENCY  PT (By licensed PT), OT (By licensed  OT)     PT Frequency: Minimum 5x a week OT Frequency: Minimum 5x a week            Contractures Contractures Info: Not present    Additional Factors Info  Code Status, Allergies, Psychotropic Code Status Info: Full Code Allergies Info: No Known Allergies Psychotropic Info: mirtazapine (REMERON) tablet 15 mg         Current Medications (07/23/2021):  This is the current hospital active medication list Current Facility-Administered Medications  Medication Dose Route Frequency Provider Last Rate Last Admin   (feeding supplement) PROSource Plus liquid 30 mL  30 mL Oral BID BM Ronnette Juniper, MD   30 mL at 07/19/21 1428   acetaminophen (TYLENOL) tablet 650 mg  650 mg Oral Q6H PRN Ronnette Juniper, MD       Or   acetaminophen (TYLENOL) suppository 650 mg  650 mg Rectal Q6H PRN Ronnette Juniper, MD       ascorbic acid (VITAMIN C) tablet 500 mg  500 mg Oral BID Ronnette Juniper, MD   500 mg at 07/23/21 0935   bisacodyl (DULCOLAX) EC tablet 5 mg  5 mg Oral Daily PRN Ronnette Juniper, MD       Chlorhexidine Gluconate Cloth 2 % PADS 6 each  6 each Topical Daily Ronnette Juniper, MD   6 each at 07/23/21 [id number]   cholecalciferol (VITAMIN D) tablet 5,000 Units  5,000 Units Oral Daily Ronnette Juniper, MD   5,000 Units at 07/23/21 0935   feeding supplement (ENSURE ENLIVE / ENSURE PLUS) liquid 237 mL  237 mL Oral BID BM Rizwan, Eunice Blase, MD   237 mL at [id number]   folic acid (FOLVITE) tablet 1 mg  1 mg Oral Daily Ronnette Juniper, MD   1 mg at 07/23/21 0935   HYDROcodone-acetaminophen (NORCO/VICODIN) 5-325 MG per tablet 1 tablet  1 tablet Oral Q6H PRN Ronnette Juniper, MD       lactobacillus acidophilus & bulgar (LACTINEX) chewable tablet 2 tablet  2 tablet Oral TID Ronnette Juniper, MD   2 tablet at 07/23/21 0941   lactulose ([place]) 10 GM/15ML solution 10 g  10 g Oral BID Elodia Florence., MD   10 g at 07/22/21 1000   levothyroxine (SYNTHROID) tablet 125 mcg  125 mcg Oral Q0600 Ronnette Juniper, MD   125 mcg at 07/23/21 0523   liver  oil-zinc oxide (DESITIN) 40 % ointment   Topical PRN Mariel Aloe, MD   Given at 07/20/21 1747   mirtazapine (REMERON) tablet 15 mg  15 mg Oral QHS Elodia Florence., MD   15 mg at 07/22/21 2258   multivitamin with minerals tablet 1 tablet  1 tablet Oral Daily Debbe Odea, MD   1 tablet at 07/23/21 0935   ondansetron (ZOFRAN) tablet 4 mg  4 mg Oral Q8H PRN Ronnette Juniper, MD   4 mg at 07/21/21 0946   pantoprazole (PROTONIX) EC tablet 40 mg  40 mg Oral BID AC Ronnette Juniper, MD   40 mg at 07/23/21 0727   protein supplement (RESOURCE BENEPROTEIN) powder packet 6 g  1 [place] Oral TID WC Mariel Aloe, MD   6 g at 07/23/21 0727   sucralfate (CARAFATE) 1 GM/10ML suspension 1 g  1 g Oral TID WC & HS Ronnette Juniper, MD   1 g at 07/23/21 1215     Discharge Medications: Please see discharge summary for a list of discharge medications.  Relevant Imaging Results:  Relevant Lab Results:   Additional Information SSN [id number] Patient was Covid+ on 06/28/21  Ross Ludwig, LCSW

## 2021-07-24 DIAGNOSIS — E875 Hyperkalemia: Secondary | ICD-10-CM

## 2021-07-24 LAB — BASIC METABOLIC PANEL
Anion gap: 8 (ref 5–15)
Anion gap: 8 (ref 5–15)
BUN: 90 mg/dL — ABNORMAL HIGH (ref 8–23)
BUN: 93 mg/dL — ABNORMAL HIGH (ref 8–23)
CO2: 18 mmol/L — ABNORMAL LOW (ref 22–32)
CO2: 19 mmol/L — ABNORMAL LOW (ref 22–32)
Calcium: 7.4 mg/dL — ABNORMAL LOW (ref 8.9–10.3)
Calcium: 7.5 mg/dL — ABNORMAL LOW (ref 8.9–10.3)
Chloride: 110 mmol/L (ref 98–111)
Chloride: 110 mmol/L (ref 98–111)
Creatinine, Ser: 1.77 mg/dL — ABNORMAL HIGH (ref 0.44–1.00)
Creatinine, Ser: 1.87 mg/dL — ABNORMAL HIGH (ref 0.44–1.00)
GFR, Estimated: 29 mL/min — ABNORMAL LOW (ref >60)
GFR, Estimated: 27 mL/min — ABNORMAL LOW (ref >60)
Glucose, Bld: 82 mg/dL (ref 70–99)
Glucose, Bld: 115 mg/dL — ABNORMAL HIGH (ref 70–99)
Potassium: 5.9 mmol/L — ABNORMAL HIGH (ref 3.5–5.1)
Potassium: 4.7 mmol/L (ref 3.5–5.1)
Sodium: 136 mmol/L (ref 135–145)
Sodium: 137 mmol/L (ref 135–145)

## 2021-07-24 LAB — AMMONIA: Ammonia: 91 umol/L — ABNORMAL HIGH (ref 9–35)

## 2021-07-24 MED ORDER — FUROSEMIDE 10 MG/ML IJ SOLN: 20.0000 mg | Freq: Once | INTRAMUSCULAR | Status: DC

## 2021-07-24 MED ORDER — ALBUMIN HUMAN 25 % IV SOLN
12.5000 g | Freq: Once | INTRAVENOUS | Status: AC
Start: 2021-07-24 — End: 2021-07-25
  Administered 2021-07-24: 12.5 g via INTRAVENOUS
  Filled 2021-07-24: qty 50

## 2021-07-24 MED ORDER — LACTATED RINGERS IV BOLUS
250.0000 mL | Freq: Once | INTRAVENOUS | Status: AC
  Administered 2021-07-24: 250 mL via INTRAVENOUS

## 2021-07-24 NOTE — Evaluation (Signed)
Occupational Therapy Evaluation Patient Details Name: Sally Wise MRN: [id number] DOB: 06/26/1946 Today's Date: 07/24/2021   History of Present Illness Sally Wise is a 76 y.o. female who presents secondary to ongoing weakness and poor oral intake. Since d/c from her ankle fracture, patient has been in a SNF [facility], then [place]) functionally declining since ankle fx. PMH: paroxysmal afib, hypothyroidism, anemia/thrombocytopenia, depression/anxiety, morbid obesity, recent left ankle fracture s/p ORIF 05/07/2021   Clinical Impression   Sally Wise is a 76 year old woman admitted with above medical history. On evaluation she presents with generalized weakness, decreased activity tolerance and presumed impaired balance. She also presents with cognitive impairments - she states the correct year but not the month. She thinks she has been in the hospital since her fracture and when asked how much help she gets to dress herself - she states she does it on her own. She has a flat and depressed affect. Apparently she hasn't walked or stood since her fracture and has been hoyered to wheelchair at facility. On evaluation she was able to was her face and drink from a cup - grossly lift her arms though shoulder ROM limited by weakness. She is max-total assist for all other ADLs with +2 assistance needed for pericare and LB dressing. With attempt for rolling in bed she can reach the rail but no assistance given for trunk and LB movement. She requires at at least 2 people for bed mobility. Patient's progress has been limited by two hospitalizations. Patient will benefit from skilled OT services while in hospital to improve deficits and reduce caregiver burden.      Recommendations for follow up therapy are one component of a multi-disciplinary discharge planning process, led by the attending physician.  Recommendations may be updated based on patient status, additional functional criteria and  insurance authorization.   Follow Up Recommendations  Skilled nursing-short term rehab (<3 hours/day)    Assistance Recommended at Discharge Frequent or constant Supervision/Assistance  Patient can return home with the following      Functional Status Assessment  Patient has had a recent decline in their functional status and/or demonstrates limited ability to make significant improvements in function in a reasonable and predictable amount of time  Equipment Recommendations  Other (comment) (TBD at SNF)    Recommendations for Other Services       Precautions / Restrictions Precautions Precautions: Fall Required Braces or Orthoses: Other Brace Other Brace: camboot on L Restrictions Weight Bearing Restrictions: Yes LLE Weight Bearing: Partial weight bearing LLE Partial Weight Bearing Percentage or Pounds: 75 lb      Mobility Bed Mobility   Bed Mobility: Rolling Rolling: +2 for physical assistance         General bed mobility comments: Patient able to reach for rail - rolling to the right - and get both hands on rail. Still requires +2 physical assistance to roll.    Transfers                          Balance                                           ADL either performed or assessed with clinical judgement   ADL Overall ADL's : Needs assistance/impaired Eating/Feeding: Set up   Grooming: Set up;Wash/dry face;Wash/dry hands;Bed level  Upper Body Bathing: Maximal assistance;Bed level   Lower Body Bathing: Total assistance;Bed level   Upper Body Dressing : Maximal assistance;Bed level   Lower Body Dressing: Total assistance;Bed level   Toilet Transfer: +2 for physical assistance;+2 for safety/equipment;Total assistance Toilet Transfer Details (indicate cue type and reason): unable Toileting- Clothing Manipulation and Hygiene: Total assistance;+2 for physical assistance;+2 for safety/equipment Toileting - Clothing Manipulation  Details (indicate cue type and reason): incontinenet of bowel and bladder   Tub/Shower Transfer Details (indicate cue type and reason): unable Functional mobility during ADLs: Total assistance;+2 for physical assistance       Vision Patient Visual Report: No change from baseline Vision Assessment?: No apparent visual deficits     Perception     Praxis      Pertinent Vitals/Pain Pain Assessment: Faces Faces Pain Scale: Hurts little more Pain Location: with movement Pain Descriptors / Indicators: Grimacing Pain Intervention(s): Limited activity within patient's tolerance     Hand Dominance Right   Extremity/Trunk Assessment Upper Extremity Assessment Upper Extremity Assessment: RUE deficits/detail;LUE deficits/detail RUE Deficits / Details: 3-/5 shoulder, 3+/5 elbow, 4-/5 wrist, 3+/5 grip RUE Sensation: WNL RUE Coordination: WNL LUE Deficits / Details: 3-/5 shoulder, 3+/5 elbow, 4-/5 wrist, 3+/5 grip LUE Sensation: WNL LUE Coordination: WNL   Lower Extremity Assessment Lower Extremity Assessment: Defer to PT evaluation   Cervical / Trunk Assessment Cervical / Trunk Exceptions: large body habitus   Communication Communication Communication: No difficulties   Cognition Arousal/Alertness: Awake/alert Behavior During Therapy: Flat affect                                   General Comments: pt a&o x2, flat affect, no family present. Pt reports it is Feb 2023, but has difficulty understanding has currently been in hospital for 13 days and at rehab prior. Also reports to this therapist that she has been in hospital since fracture/ORIF Oct 2022. Appears depressed with minimal internal movitation.     General Comments       Exercises     Shoulder Instructions      Home Living Family/patient expects to be discharged to:: Skilled nursing facility                                        Prior Functioning/Environment Prior Level of  Function : Needs assist       Physical Assist : Mobility (physical);ADLs (physical) Mobility (physical): Bed mobility;Transfers ADLs (physical): Bathing;Dressing;Toileting Mobility Comments: using hoyer lift to transfer to w/c, hasn't ambulated since original surgery Oct 2022; at SNF since surgery where they assist with self care tasks ADLs Comments: Requires set up for grooming and feeding and near total assist for all bathing, dressing, toileting        OT Problem List: Decreased activity tolerance;Impaired balance (sitting and/or standing);Decreased safety awareness;Impaired UE functional use;Decreased knowledge of use of DME or AE;Increased edema;Decreased strength;Decreased cognition;Decreased knowledge of precautions;Obesity;Pain      OT Treatment/Interventions: Self-care/ADL training;Therapeutic exercise;Neuromuscular education;Energy conservation;DME and/or AE instruction;Therapeutic activities;Balance training;Patient/family education    OT Goals(Current goals can be found in the care plan section) Acute Rehab OT Goals OT Goal Formulation: Patient unable to participate in goal setting Time For Goal Achievement: 08/07/21 Potential to Achieve Goals: Fair  OT Frequency: Min 2X/week    Co-evaluation  AM-PAC OT "6 Clicks" Daily Activity     Outcome Measure Help from another person eating meals?: A Little Help from another person taking care of personal grooming?: A Little Help from another person toileting, which includes using toliet, bedpan, or urinal?: Total Help from another person bathing (including washing, rinsing, drying)?: A Lot Help from another person to put on and taking off regular upper body clothing?: A Lot Help from another person to put on and taking off regular lower body clothing?: Total 6 Click Score: 12   End of Session    Activity Tolerance: Patient limited by fatigue Patient left: in bed;with call bell/phone within reach  OT Visit  Diagnosis: History of falling (Z91.81);Muscle weakness (generalized) (M62.81);Pain                Time: 8144-8185 OT Time Calculation (min): 14 min Charges:  OT General Charges $OT Visit: 1 Visit OT Evaluation $OT Eval Low Complexity: 1 Low  Yatzary Merriweather, OTR/L Acute Care Rehab Services  Office [phone] Pager: [phone]   Kelli Churn 07/24/2021, 9:15 AM

## 2021-07-24 NOTE — Assessment & Plan Note (Addendum)
Hemolysis, improved

## 2021-07-24 NOTE — Progress Notes (Signed)
Patient BP 70/36, HR 84, MAP 45, O2 100% RA, RR 18. Pt does not appear to be in any acute distress. Pt alert and oriented and asymptomatic. NP notified, lasix held, see new orders. Will continue to assess patient.

## 2021-07-24 NOTE — Progress Notes (Signed)
PROGRESS NOTE    Sally Wise  [id number] DOB: 1946-01-04 DOA: 07/10/2021 PCP: [name], MD  Chief Complaint  Patient presents with   Weakness    Brief Narrative:  76 yo with hx atrial fibrillation, hypothyroidism, chronic thrombocytopenia, HFpEF, HLD, HTN, depression and multiple other medical issues who presented with weakness and lethargy.  She's had poor PO intake.  Of note, she fractured her ankle in October 2022 and has been living at [facility] since then.  She was admitted with dehydration, hypotension, and AKI on 12/12 and returned on 12/28 with similar issues.  She was seen by GI on 12/30 for upper endoscopy which showed Tyja Gortney nonbleeding duodenal ulcer, erythematous mucosa in the gastric body and erythematous duodenopathy.  Labs for H pylori and celiac are pending.  GI continuing to follow.  Heme consulted due to persistently elevated INR.  Palliative has been consulted as well to assist with goals of care.  See below for additional details    Assessment & Plan:   Principal Problem:   Anasarca Active Problems:   AKI (acute kidney injury) ([place])   Hyperkalemia   Acute metabolic encephalopathy   Lack of appetite   PUD (peptic ulcer disease)   Chronic kidney disease with active medical management without dialysis, stage 3 (moderate) (HCC)   Elevated INR   Thrombocytopenia (HCC)   Fatty liver disease, nonalcoholic   Anemia   Ankle fracture   Hypoalbuminemia   Pressure ulcer   Contamination of blood culture   Hypothyroidism   Morbid obesity (HCC)   PAF (paroxysmal atrial fibrillation) (HCC)   * Anasarca- (present on admission) Chronic. Likely related to chronic hypoalbuminemia. Initially given IV fluids with albumin. Foley catheter placed on 07/11/2021 and removed on 07/19/2021.  Hyperkalemia Hemolysis, repeat, follow with lasix  AKI (acute kidney injury) ([place]) Creatinine up to 1.8. Likely secondary to poor oral intake, but complicated with inability to  document accurate urine output in addition to anasarca. Unable to obtain daily weights secondary to lack of bed function but will double check. Patient unable to perform standing weights -Renal ultrasound to rule out retention etiology - without hydro -creatinine slightly improved with lasix 20, will repeat with volume (give albumin)  Lack of appetite Unclear cause. Dietitian consulted. Psychiatry consulted. Patient started on Remeron to help with appetite. Per patient, she has no decrease in appetite but has particular eating habits. Patient declines enteral nutrition via feeding tube. GI consulted and patient underwent upper endoscopy with erythematous duodenopathy with associated non-bleeding duodenal ulcer with clean base. -Continue Remeron  Acute metabolic encephalopathy Mild. Per husband, this has been Demitri Kucinski persistent issue.  TSH of 8.6 with normal free T4. Ammonia elevated at 45 > 48 > 63.  Not sure lactulose has lead to any benefit and she's having diarrhea, will hold and repeat ammonia in AM Vitamin B12 and folate are within normal limits.  Cortisol normal with Rankin Coolman normal ACTH stim test.  Psychiatry evaluated and recommended transition from Lexapro to Remeron.  MRI brain without acute process. Chronic ischemic changes noted. Discussed with neurology and findings do not explain patient presentation; dural thickening acknowledged but recommendation for no further workup per neurology unless neurologic symptoms arose.  PUD (peptic ulcer disease) EGD with erythematous mucosa in gastric body, erythematous duodenopathy, nonbleeding duodenal ulcer with clean base. H. Pylori and celiac serology negative (negative TTG, reticulin ab, and gliadin ab). GI recommending Protonix 40 mg BID x1 month and sucralfate suspension 1 g PO QID x2  weeks.  -Re-order h pylori stool ab (pending)  Elevated INR Unclear etiology. Hematology consulted and have recommended IV vitamin K. Treatment completed. Follow  INR  Chronic kidney disease with active medical management without dialysis, stage 3 (moderate) ([place])- (present on admission) Baseline creatinine of about 1.2  Fatty liver disease, nonalcoholic Likely diagnosis from recent abdominal ultrasound and more recent CT abdomen/pelvis. Patient would benefit from outpatient hepatology.  Thrombocytopenia ([place])- (present on admission) Chronic and stable. Likely related to underlying liver disease.  Anemia- (present on admission) Chronic and stable.  Ankle fracture- (present on admission) Left ankle. History of ORIF on recent admission on 05/07/21. Max weight bearing of 75 lbs in boot for one month.  Hypoalbuminemia- (present on admission) Possibly secondary to poor nutrition vs liver disease. Stable.  Pressure ulcer Medial groin, POA  Contamination of blood culture 1 of 2 from 12/28 with staph epidermidis. Likely contaminant.  Morbid obesity ([place])- (present on admission) Body mass index is 44.25 kg/m.  Dietitian recommendations: 48 hour Calorie Count (07/19/2021) Ensure Enlive po BID, each supplement provides 350 kcal and 20 grams of protein Prosource Plus PO BID, each provides 100 kcals and 15g protein Magic cup TID with meals, each supplement provides 290 kcal and 9 grams of protein  Beneprotein powder TID, each provides 25 kcals and 6g protein. Encouraged PO intakes and supplements  Hypothyroidism- (present on admission) Synthroid recently increased from 112 mcg to 125 mcg (on 12/19 dc summary noted). TSH slightly improved this admission. -Continue Synthroid -Repeat TSH in 2-3 weeks; increase Synthroid if TSH still elevated     DVT prophylaxis: SCD Code Status: full Family Communication: husband at bedside Disposition:   Status is: Inpatient  Remains inpatient appropriate because: plan for d/c       Consultants:  GI Palliative care Oncology psychiatry  Procedures:  UPPER GI ENDOSCOPY (07/12/2021) Impression:                - Normal esophagus.                           - Erythematous mucosa in the gastric body.                           - Erythematous duodenopathy.                           - Non-bleeding duodenal ulcer with Devyn Griffing clean ulcer                            base (Forrest Class III).                           - No specimens collected.   Recommendation:           - Resume regular diet.                           - Use Protonix (pantoprazole) 40 mg PO BID for 1                            month.                           -  Use sucralfate suspension 1 gram PO QID for 2                            weeks.                           - Stool for H pylori Antigen and celiac serology.  Antimicrobials:  Anti-infectives (From admission, onward)    Start     Dose/Rate Route Frequency Ordered Stop   07/11/21 0800  cefTRIAXone (ROCEPHIN) 2 g in sodium chloride 0.9 % 100 mL IVPB  Status:  Discontinued        2 g 200 mL/hr over 30 Minutes Intravenous Every 24 hours 07/10/21 1807 07/11/21 0906   07/10/21 0700  cefTRIAXone (ROCEPHIN) 2 g in sodium chloride 0.9 % 100 mL IVPB        2 g 200 mL/hr over 30 Minutes Intravenous  Once 07/10/21 0654 07/10/21 0816       Subjective: C/o diarrhea, incontinence  Objective: Vitals:   07/23/21 1415 07/23/21 2202 07/24/21 0553 07/24/21 1358  BP: (!) 101/49 (!) 91/57 94/62 (!) 84/65  Pulse: 81 86 84 80  Resp: 16 16 17 18   Temp: 98.1 F (36.7 C) 98.3 F (36.8 C) 97.7 F (36.5 C) 97.9 F (36.6 C)  TempSrc: Oral Oral Oral Oral  SpO2: 98% 100% 100% 100%  Weight:      Height:        Intake/Output Summary (Last 24 hours) at 07/24/2021 1752 Last data filed at 07/24/2021 1400 Gross per 24 hour  Intake 720 ml  Output 0 ml  Net 720 ml   Filed Weights   07/12/21 1018 07/16/21 0500 07/23/21 1058  Weight: 135.6 kg 132 kg (!) 168.7 kg    Examination:  General exam: Appears calm and comfortable  Respiratory system: unlabored Cardiovascular system:  RRR Gastrointestinal system: Abdomen is nondistended, soft and nontender. Central nervous system: Alert and oriented. No focal neurological deficits. Extremities: anasarca Skin: No rashes, lesions or ulcers Psychiatry: Judgement and insight appear normal. Mood & affect appropriate.     Data Reviewed: I have personally reviewed following labs and imaging studies  CBC: Recent Labs  Lab 07/22/21 1106  WBC 12.1*  HGB 7.8*  HCT 25.3*  MCV 98.8  PLT 86*    Basic Metabolic Panel: Recent Labs  Lab 07/22/21 0949 07/24/21 0526  NA 138 136  K 4.5 5.9*  CL 111 110  CO2 18* 18*  GLUCOSE 117* 82  BUN 89* 90*  CREATININE 1.82* 1.77*  CALCIUM 7.5* 7.4*    GFR: Estimated Creatinine Clearance: 44.5 mL/min (Arn Mcomber) (by C-G formula based on SCr of 1.77 mg/dL (H)).  Liver Function Tests: No results for input(s): AST, ALT, ALKPHOS, BILITOT, PROT, ALBUMIN in the last 168 hours.  CBG: No results for input(s): GLUCAP in the last 168 hours.   No results found for this or any previous visit (from the past 240 hour(s)).       Radiology Studies: US RENAL  Result Date: 07/23/2021 CLINICAL DATA:  Renal dysfunction EXAM: RENAL / URINARY TRACT ULTRASOUND COMPLETE COMPARISON:  CT done on 07/11/2021 FINDINGS: Right Kidney: Renal measurements: 9.4 x 5.1 x 4.8 cm = volume: 120 mL. Echogenicity within normal limits. No mass or hydronephrosis visualized. Left Kidney: Renal measurements: 9.7 x 6 x 4.5 cm = volume: 137.7 mL. Echogenicity within normal limits. No mass or hydronephrosis visualized. Bladder: Appears  normal for degree of bladder distention. Other: Small ascites is present. IMPRESSION: There is no hydronephrosis. Electronically Signed   By: Elmer Picker M.D.   On: 07/23/2021 16:52        Scheduled Meds:  (feeding supplement) PROSource Plus  30 mL Oral BID BM   ascorbic acid  500 mg Oral BID   Chlorhexidine Gluconate Cloth  6 each Topical Daily   cholecalciferol  5,000 Units  Oral Daily   feeding supplement  237 mL Oral BID BM   folic acid  1 mg Oral Daily   furosemide  20 mg Intravenous Once   lactobacillus acidophilus & bulgar  2 tablet Oral TID   levothyroxine  125 mcg Oral Q0600   mirtazapine  15 mg Oral QHS   multivitamin with minerals  1 tablet Oral Daily   pantoprazole  40 mg Oral BID AC   protein supplement  1 Scoop Oral TID WC   sucralfate  1 g Oral TID WC & HS   Continuous Infusions:  albumin human       LOS: 13 days    Time spent: over 30 min    Fayrene Helper, MD [facility]   To contact the attending provider between 7A-7P or the covering provider during after hours 7P-7A, please log into the web site [email or web address] and access using universal [place] password for that web site. If you do not have the password, please call the hospital operator.  07/24/2021, 5:52 PM

## 2021-07-25 LAB — CBC WITH DIFFERENTIAL/PLATELET
Abs Immature Granulocytes: 0.05 10*3/uL (ref 0.00–0.07)
Basophils Absolute: 0 10*3/uL (ref 0.0–0.1)
Basophils Relative: 0 %
Eosinophils Absolute: 0.4 10*3/uL (ref 0.0–0.5)
Eosinophils Relative: 4 %
HCT: 24.1 % — ABNORMAL LOW (ref 36.0–46.0)
Hemoglobin: 7.3 g/dL — ABNORMAL LOW (ref 12.0–15.0)
Immature Granulocytes: 1 %
Lymphocytes Relative: 46 %
Lymphs Abs: 4.1 10*3/uL — ABNORMAL HIGH (ref 0.7–4.0)
MCH: 31.7 pg (ref 26.0–34.0)
MCHC: 30.3 g/dL (ref 30.0–36.0)
MCV: 104.8 fL — ABNORMAL HIGH (ref 80.0–100.0)
Monocytes Absolute: 0.9 10*3/uL (ref 0.1–1.0)
Monocytes Relative: 10 %
Neutro Abs: 3.5 10*3/uL (ref 1.7–7.7)
Neutrophils Relative %: 39 %
Platelets: 82 10*3/uL — ABNORMAL LOW (ref 150–400)
RBC: 2.3 MIL/uL — ABNORMAL LOW (ref 3.87–5.11)
RDW: 26.5 % — ABNORMAL HIGH (ref 11.5–15.5)
WBC: 8.9 10*3/uL (ref 4.0–10.5)
nRBC: 0.2 % (ref 0.0–0.2)

## 2021-07-25 LAB — SODIUM, URINE, RANDOM: Sodium, Ur: <10 mmol/L

## 2021-07-25 LAB — COMPREHENSIVE METABOLIC PANEL
ALT: 22 U/L (ref 0–44)
AST: 19 U/L (ref 15–41)
Albumin: 1.9 g/dL — ABNORMAL LOW (ref 3.5–5.0)
Alkaline Phosphatase: 318 U/L — ABNORMAL HIGH (ref 38–126)
Anion gap: 9 (ref 5–15)
BUN: 90 mg/dL — ABNORMAL HIGH (ref 8–23)
CO2: 19 mmol/L — ABNORMAL LOW (ref 22–32)
Calcium: 7.6 mg/dL — ABNORMAL LOW (ref 8.9–10.3)
Chloride: 110 mmol/L (ref 98–111)
Creatinine, Ser: 1.67 mg/dL — ABNORMAL HIGH (ref 0.44–1.00)
GFR, Estimated: 31 mL/min — ABNORMAL LOW (ref >60)
Glucose, Bld: 82 mg/dL (ref 70–99)
Potassium: 4.3 mmol/L (ref 3.5–5.1)
Sodium: 138 mmol/L (ref 135–145)
Total Bilirubin: 1.4 mg/dL — ABNORMAL HIGH (ref 0.3–1.2)
Total Protein: 4.9 g/dL — ABNORMAL LOW (ref 6.5–8.1)

## 2021-07-25 LAB — PROTIME-INR
INR: 2.2 — ABNORMAL HIGH (ref 0.8–1.2)
Prothrombin Time: 24.6 seconds — ABNORMAL HIGH (ref 11.4–15.2)

## 2021-07-25 LAB — PHOSPHORUS: Phosphorus: 4.7 mg/dL — ABNORMAL HIGH (ref 2.5–4.6)

## 2021-07-25 LAB — AMMONIA: Ammonia: 85 umol/L — ABNORMAL HIGH (ref 9–35)

## 2021-07-25 LAB — CREATININE, URINE, RANDOM: Creatinine, Urine: 59.15 mg/dL

## 2021-07-25 LAB — MAGNESIUM: Magnesium: 2.4 mg/dL (ref 1.7–2.4)

## 2021-07-25 MED ORDER — LACTULOSE 10 GM/15ML PO SOLN
10.0000 g | Freq: Two times a day (BID) | ORAL | Status: DC
  Administered 2021-07-25 – 2021-07-26 (×2): 10 g via ORAL
  Filled 2021-07-25 (×2): qty 15

## 2021-07-25 MED ORDER — ALBUMIN HUMAN 25 % IV SOLN
25.0000 g | Freq: Four times a day (QID) | INTRAVENOUS | Status: AC
  Administered 2021-07-25 – 2021-07-26 (×4): 25 g via INTRAVENOUS
  Filled 2021-07-25 (×4): qty 100

## 2021-07-25 MED ORDER — LACTATED RINGERS IV BOLUS
250.0000 mL | Freq: Once | INTRAVENOUS | Status: AC
  Administered 2021-07-25: 250 mL via INTRAVENOUS

## 2021-07-25 MED ORDER — MIDODRINE HCL 5 MG PO TABS
5.0000 mg | ORAL_TABLET | Freq: Three times a day (TID) | ORAL | Status: DC
  Administered 2021-07-25 – 2021-07-26 (×5): 5 mg via ORAL
  Filled 2021-07-25 (×4): qty 1

## 2021-07-25 MED ORDER — MIDODRINE HCL 5 MG PO TABS
5.0000 mg | ORAL_TABLET | Freq: Three times a day (TID) | ORAL | Status: DC
  Filled 2021-07-25: qty 1

## 2021-07-25 MED FILL — Protein Oral Pack: ORAL | Qty: 6 | Status: AC

## 2021-07-25 NOTE — TOC Progression Note (Signed)
Transition of Care [facility]) - Progression Note    Patient Details  Name: HENLEY REINECK MRN: [id number] Date of Birth: [birth year]  Transition of Care [facility]) CM/SW Contact  Lennart Pall, LCSW Phone Number: 07/25/2021, 3:11 PM  Clinical Narrative:    Have spoken with pt's spouse about insurance denial for SNF coverage following peer-to-peer.  Family does not plan to appeal.  Spouse aware need for LTC and that this is private payment.  He is agreeable to LTC bed search - begun.   Expected Discharge Plan: [place] Barriers to Discharge: Continued Medical Work up  Expected Discharge Plan and Services Expected Discharge Plan: [place]   Discharge Planning Services: CM Consult Post Acute Care Choice: [place] Living arrangements for the past 2 months: [place]                                       Social Determinants of Health (SDOH) Interventions    Readmission Risk Interventions Readmission Risk Prevention Plan 07/12/2021 06/08/2020  Post Dischage Appt - Complete  Medication Screening - Complete  Transportation Screening Complete Complete  PCP or Specialist Appt within 3-5 Days Complete -  HRI or Home Care Consult Complete -  Social Work Consult for [place] Planning/Counseling Complete -  Palliative Care Screening Not Applicable -  Medication Review Press photographer) Complete -  Some recent data might be hidden

## 2021-07-25 NOTE — Progress Notes (Addendum)
PROGRESS NOTE    Sally Wise  [id number] DOB: 02-12-1946 DOA: 07/10/2021 PCP: Sally Floro, MD  Chief Complaint  Patient presents with   Weakness    Brief Narrative:  76 yo with hx atrial fibrillation, hypothyroidism, chronic thrombocytopenia, HFpEF, HLD, HTN, depression and multiple other medical issues who presented with weakness and lethargy.  She's had poor PO intake.  Of note, she fractured her ankle in October 2022 and has been living at [facility] since then.  She was admitted with dehydration, hypotension, and AKI on 12/12 and returned on 12/28 with similar issues.  She was seen by GI on 12/30 for upper endoscopy which showed Sally Wise nonbleeding duodenal ulcer, erythematous mucosa in the gastric body and erythematous duodenopathy.  Labs for H pylori pending and celiac labs are negative.  GI now signed off.  Heme consulted  (now signed off) due to persistently elevated INR.  Palliative was consulted as well.  She remains anasarcic with poor appetite.  At this time, trying to work towards discharge, but complicated by hypotension and AKI.  See below for additional details    Assessment & Plan:   Principal Problem:   Anasarca Active Problems:   Hypotension   AKI (acute kidney injury) (HCC)   Acute metabolic encephalopathy   Lack of appetite   Fatty liver disease, nonalcoholic   Hyperkalemia   PUD (peptic ulcer disease)   Chronic kidney disease with active medical management without dialysis, stage 3 (moderate) (HCC)   Elevated INR   Thrombocytopenia (HCC)   Anemia   Ankle fracture   Hypoalbuminemia   Pressure ulcer   Contamination of blood culture   Hypothyroidism   Morbid obesity (HCC)   PAF (paroxysmal atrial fibrillation) (HCC)   * Anasarca- (present on admission) Chronic. Likely related to chronic hypoalbuminemia.  Foley catheter placed on 07/11/2021 and removed on 07/19/2021. Echo with normal EF, grade I diastolic dysfunction Urine without notable proteinuria  (UP/C 0.17) With hypotension now as well Follow urine sodium as above I'll discuss this case with nephrology  AKI (acute kidney injury) (HCC) Creatinine up to 1.8. Likely secondary to poor oral intake, but complicated with inability to document accurate urine output in addition to anasarca. Unable to obtain daily weights secondary to lack of bed function but will double check. Patient unable to perform standing weights -Renal ultrasound to rule out retention etiology - without hydro -diuresis on hold with low SBP -as above, will discuss with renal, follow urine sodium  Hypotension- (present on admission) This has been noted intermittently for Sally Wise while, more severe last night with SBP to 70's She remains diffusely edematous with hypoalbuminemia, though suspect she's intravascularly dry? Will give albumin Start midodrine Follow BP closely Follow urine sodium   Hyperkalemia Hemolysis, improved  Fatty liver disease, nonalcoholic Imaging showed fatty liver -> her constellation of findings make me concerned/suspicious regarding her synthetic liver function and presence of cirrhosis?  High INR despite vit K, thrombocytopenia, hypoalbuminemia, mildly elevated bilirubin Likely diagnosis from recent abdominal ultrasound and more recent CT abdomen/pelvis. Patient would benefit from outpatient hepatology. Consider additional liver imaging (MRI/elastography?)  Lack of appetite Unclear cause. Dietitian consulted. Psychiatry consulted. Patient started on Remeron to help with appetite. Per patient, she has no decrease in appetite but has particular eating habits. Patient declines enteral nutrition via feeding tube. GI consulted and patient underwent upper endoscopy with erythematous duodenopathy with associated non-bleeding duodenal ulcer with clean base. -Continue Remeron  Acute metabolic encephalopathy Mild. Per husband, this  has been Sally Wise persistent issue.  TSH of 8.6 with normal free T4. Ammonia  remains elevated, worse than previously. Continue lactulose for goal 2-3 BM's daily, currently having diarrhea - she doesn't clearly have asterixis.  Mental status seems baseline for my exam. Vitamin B12 and folate are within normal limits.  Cortisol normal with Sally Wise normal ACTH stim test.  Psychiatry evaluated and recommended transition from Lexapro to Remeron.  MRI brain without acute process. Chronic ischemic changes noted. Discussed with neurology and findings do not explain patient presentation; dural thickening acknowledged but recommendation for no further workup per neurology unless neurologic symptoms arose.  PUD (peptic ulcer disease) EGD with erythematous mucosa in gastric body, erythematous duodenopathy, nonbleeding duodenal ulcer with clean base. H. Pylori and celiac serology negative (negative TTG, reticulin ab, and gliadin ab). GI recommending Protonix 40 mg BID x1 month and sucralfate suspension 1 g PO QID x2 weeks.  -Re-order h pylori stool ab (pending)  Elevated INR Unclear etiology. Hematology consulted and have recommended IV vitamin K. Treatment completed. Follow INR - remains elevated  Chronic kidney disease with active medical management without dialysis, stage 3 (moderate) ([place])- (present on admission) Baseline creatinine of about 1.2  Thrombocytopenia ([place])- (present on admission) Chronic and stable. Likely related to underlying liver disease.  Anemia- (present on admission) Chronic and stable.  Ankle fracture- (present on admission) Left ankle. History of ORIF on recent admission on 05/07/21. Max weight bearing of 75 lbs in boot for one month.  Hypoalbuminemia- (present on admission) Possibly secondary to poor nutrition vs liver disease. Stable.  Pressure ulcer Medial groin, POA  Contamination of blood culture 1 of 2 from 12/28 with staph epidermidis. Likely contaminant.  Morbid obesity ([place])- (present on admission) Body mass index is 44.25 kg/m.   Dietitian recommendations: 48 hour Calorie Count (07/19/2021) Ensure Enlive po BID, each supplement provides 350 kcal and 20 grams of protein Prosource Plus PO BID, each provides 100 kcals and 15g protein Magic cup TID with meals, each supplement provides 290 kcal and 9 grams of protein  Beneprotein powder TID, each provides 25 kcals and 6g protein. Encouraged PO intakes and supplements  Hypothyroidism- (present on admission) Synthroid recently increased from 112 mcg to 125 mcg (on 12/19 dc summary noted). TSH slightly improved this admission. -Continue Synthroid -Repeat TSH in 2-3 weeks; increase Synthroid if TSH still elevated     DVT prophylaxis: SCD Code Status: full Family Communication: husband at bedside Disposition:   Status is: Inpatient  Remains inpatient appropriate because: plan for d/c       Consultants:  GI Palliative care Oncology psychiatry  Procedures:  UPPER GI ENDOSCOPY (07/12/2021) Impression:               - Normal esophagus.                           - Erythematous mucosa in the gastric body.                           - Erythematous duodenopathy.                           - Non-bleeding duodenal ulcer with Jasleen Riepe clean ulcer                            base (Forrest Class III).                           -  No specimens collected.   Recommendation:           - Resume regular diet.                           - Use Protonix (pantoprazole) 40 mg PO BID for 1                            month.                           - Use sucralfate suspension 1 gram PO QID for 2                            weeks.                           - Stool for H pylori Antigen and celiac serology.  Antimicrobials:  Anti-infectives (From admission, onward)    Start     Dose/Rate Route Frequency Ordered Stop   07/11/21 0800  cefTRIAXone (ROCEPHIN) 2 g in sodium chloride 0.9 % 100 mL IVPB  Status:  Discontinued        2 g 200 mL/hr over 30 Minutes Intravenous Every 24 hours  07/10/21 1807 07/11/21 0906   07/10/21 0700  cefTRIAXone (ROCEPHIN) 2 g in sodium chloride 0.9 % 100 mL IVPB        2 g 200 mL/hr over 30 Minutes Intravenous  Once 07/10/21 0654 07/10/21 0816       Subjective: frustrated  Objective: Vitals:   07/25/21 0912 07/25/21 0923 07/25/21 1132 07/25/21 1346  BP: (!) 77/48 (!) 82/56 90/60 (!) 88/59  Pulse: 76   76  Resp:    17  Temp:    (!) 97.4 F (36.3 C)  TempSrc:    Oral  SpO2: 97%   98%  Weight:      Height:        Intake/Output Summary (Last 24 hours) at 07/25/2021 1531 Last data filed at 07/25/2021 1400 Gross per 24 hour  Intake 980 ml  Output 0 ml  Net 980 ml   Filed Weights   07/12/21 1018 07/16/21 0500 07/23/21 1058  Weight: 135.6 kg 132 kg (!) 168.7 kg    Examination:  General: No acute distress. Cardiovascular RRR Lungs: unlabored Abdomen: Soft, nontender, nondistended Neurological: Alert. Moves all extremities 4 with equal strength. Cranial nerves II through XII grossly intact. No asterixis Extremities: anasarca    Data Reviewed: I have personally reviewed following labs and imaging studies  CBC: Recent Labs  Lab 07/22/21 1106 07/25/21 0423  WBC 12.1* 8.9  NEUTROABS  --  3.5  HGB 7.8* 7.3*  HCT 25.3* 24.1*  MCV 98.8 104.8*  PLT 86* 82*    Basic Metabolic Panel: Recent Labs  Lab 07/22/21 0949 07/24/21 0526 07/24/21 1918 07/25/21 0423  NA 138 136 137 138  K 4.5 5.9* 4.7 4.3  CL 111 110 110 110  CO2 18* 18* 19* 19*  GLUCOSE 117* 82 115* 82  BUN 89* 90* 93* 90*  CREATININE 1.82* 1.77* 1.87* 1.67*  CALCIUM 7.5* 7.4* 7.5* 7.6*  MG  --   --   --  2.4  PHOS  --   --   --  4.7*    GFR: Estimated Creatinine Clearance: 47.1  mL/min (Dorothia Passmore) (by C-G formula based on SCr of 1.67 mg/dL (H)).  Liver Function Tests: Recent Labs  Lab 07/25/21 0423  AST 19  ALT 22  ALKPHOS 318*  BILITOT 1.4*  PROT 4.9*  ALBUMIN 1.9*    CBG: No results for input(s): GLUCAP in the last 168 hours.   No  results found for this or any previous visit (from the past 240 hour(s)).       Radiology Studies: US RENAL  Result Date: 07/23/2021 CLINICAL DATA:  Renal dysfunction EXAM: RENAL / URINARY TRACT ULTRASOUND COMPLETE COMPARISON:  CT done on 07/11/2021 FINDINGS: Right Kidney: Renal measurements: 9.4 x 5.1 x 4.8 cm = volume: 120 mL. Echogenicity within normal limits. No mass or hydronephrosis visualized. Left Kidney: Renal measurements: 9.7 x 6 x 4.5 cm = volume: 137.7 mL. Echogenicity within normal limits. No mass or hydronephrosis visualized. Bladder: Appears normal for degree of bladder distention. Other: Small ascites is present. IMPRESSION: There is no hydronephrosis. Electronically Signed   By: Elmer Picker M.D.   On: 07/23/2021 16:52        Scheduled Meds:  (feeding supplement) PROSource Plus  30 mL Oral BID BM   ascorbic acid  500 mg Oral BID   Chlorhexidine Gluconate Cloth  6 each Topical Daily   cholecalciferol  5,000 Units Oral Daily   feeding supplement  237 mL Oral BID BM   folic acid  1 mg Oral Daily   lactobacillus acidophilus & bulgar  2 tablet Oral TID   lactulose  10 g Oral BID   levothyroxine  125 mcg Oral Q0600   midodrine  5 mg Oral TID WC   mirtazapine  15 mg Oral QHS   multivitamin with minerals  1 tablet Oral Daily   pantoprazole  40 mg Oral BID AC   protein supplement  1 Scoop Oral TID WC   sucralfate  1 g Oral TID WC & HS   Continuous Infusions:  albumin human 25 g (07/25/21 0935)     LOS: 14 days    Time spent: over 30 min    Fayrene Helper, MD [facility]   To contact the attending provider between 7A-7P or the covering provider during after hours 7P-7A, please log into the web site [email or web address] and access using universal [place] password for that web site. If you do not have the password, please call the hospital operator.  07/25/2021, 3:31 PM

## 2021-07-25 NOTE — Assessment & Plan Note (Addendum)
Now comfort measures Improved with albumin and midodrine She remains diffusely edematous with hypoalbuminemia, though suspect she's intravascularly dry with Una <10 2 units pRBC Continue midodrine Follow BP closely Follow urine sodium (low)

## 2021-07-25 NOTE — Progress Notes (Signed)
Nutrition Follow-up  DOCUMENTATION CODES:   Morbid obesity  INTERVENTION:   -Consider addition of a different appetite stimulant  -Continue to encourage PO intakes  -Ensure Enlive po BID, each supplement provides 350 kcal and 20 grams of protein  -Prosource Plus PO BID, each provides 100 kcals and 15g protein  -Magic cup TID with meals, each supplement provides 290 kcal and 9 grams of protein  -Beneprotein powder TID, each provides 25 kcals and 6g protein.  NUTRITION DIAGNOSIS:   Increased nutrient needs related to acute illness, wound healing as evidenced by estimated needs.  Ongoing.  GOAL:   Patient will meet greater than or equal to 90% of their needs  Not meeting.  MONITOR:   Diet advancement, PO intake, Supplement acceptance, Labs, Weight trends, Skin, I & O's  ASSESSMENT:   76 y.o. female with medical history significant of pA fib, hypothyroidism, chronic thrombocytopenia, chronic HFpEF, HLD, HTN, depression. Presenting with weakness and lethargy.  She fractured her ankle in October 2022 and since then has been living at a SNF.  She has barely been eating since then and is still unable to walk.  She was admitted on 12/12 for dehydration, hypotension and AKI. Admited for the same.  Patient consuming 0% of meals, drinking some fluids (at most 60 ml at mealtimes).  Accepted 1 Prosource in the last 4 days.  Accepted 1 Ensure in the last 4 days. Accepted 3 Beneprotein 1/11. Accepting vitamin supplements with the exception of today.   Pt has declined offer of feeding tube. TPN not ideal as pt has a functioning gut and can eat, just won't d/t appetite.   No new weights given unable to perform standing weights and bed does not weigh.  Medications: Vitamin C, Vitamin D, Folic acid, Lactinex, Lactulose, Remeron, Multivitamin with minerals daily, Carafate   Labs reviewed:  Elevated Phos  Diet Order:   Diet Order             Diet regular Room service appropriate?  Yes; Fluid consistency: Thin  Diet effective now                   EDUCATION NEEDS:   No education needs have been identified at this time  Skin:  Skin Assessment: Skin Integrity Issues: Skin Integrity Issues:: Stage III, Unstageable, Stage I Stage I: medial groin Stage III: mid coccyx -healing per WOC note Unstageable: distal L pretibial  Last BM:  1/4 -type 6  Height:   Ht Readings from Last 1 Encounters:  07/12/21 5\' 8"  (1.727 m)    Weight:   Wt Readings from Last 1 Encounters:  07/23/21 (!) 168.7 kg    BMI:  Body mass index is 56.56 kg/m.  Estimated Nutritional Needs:   Kcal:  2200-2400  Protein:  120-130g  Fluid:  2.2L/day  Clayton Bibles, MS, RD, LDN Inpatient Clinical Dietitian Contact information available via Amion

## 2021-07-25 NOTE — Progress Notes (Signed)
Patient BP 82/72, HR 87, 98% RA, RR 18. Asymptomatic and not appearing to be in acute distress. NP notified. See new order.

## 2021-07-26 ENCOUNTER — Inpatient Hospital Stay: Payer: Self-pay

## 2021-07-26 DIAGNOSIS — Z515 Encounter for palliative care: Secondary | ICD-10-CM

## 2021-07-26 DIAGNOSIS — E8809 Other disorders of plasma-protein metabolism, not elsewhere classified: Secondary | ICD-10-CM | POA: Diagnosis not present

## 2021-07-26 DIAGNOSIS — Z7189 Other specified counseling: Secondary | ICD-10-CM

## 2021-07-26 LAB — HEMOGLOBIN AND HEMATOCRIT, BLOOD
HCT: 23.4 % — ABNORMAL LOW (ref 36.0–46.0)
Hemoglobin: 7.3 g/dL — ABNORMAL LOW (ref 12.0–15.0)

## 2021-07-26 LAB — COMPREHENSIVE METABOLIC PANEL
ALT: 21 U/L (ref 0–44)
AST: 19 U/L (ref 15–41)
Albumin: 2.1 g/dL — ABNORMAL LOW (ref 3.5–5.0)
Alkaline Phosphatase: 279 U/L — ABNORMAL HIGH (ref 38–126)
Anion gap: 9 (ref 5–15)
BUN: 84 mg/dL — ABNORMAL HIGH (ref 8–23)
CO2: 21 mmol/L — ABNORMAL LOW (ref 22–32)
Calcium: 7.8 mg/dL — ABNORMAL LOW (ref 8.9–10.3)
Chloride: 109 mmol/L (ref 98–111)
Creatinine, Ser: 1.59 mg/dL — ABNORMAL HIGH (ref 0.44–1.00)
GFR, Estimated: 33 mL/min — ABNORMAL LOW (ref >60)
Glucose, Bld: 77 mg/dL (ref 70–99)
Potassium: 4 mmol/L (ref 3.5–5.1)
Sodium: 139 mmol/L (ref 135–145)
Total Bilirubin: 1.4 mg/dL — ABNORMAL HIGH (ref 0.3–1.2)
Total Protein: 4.5 g/dL — ABNORMAL LOW (ref 6.5–8.1)

## 2021-07-26 LAB — PREPARE RBC (CROSSMATCH)

## 2021-07-26 LAB — CBC WITH DIFFERENTIAL/PLATELET
Abs Immature Granulocytes: 0.07 10*3/uL (ref 0.00–0.07)
Basophils Absolute: 0 10*3/uL (ref 0.0–0.1)
Basophils Relative: 0 %
Eosinophils Absolute: 0.4 10*3/uL (ref 0.0–0.5)
Eosinophils Relative: 6 %
HCT: 20.4 % — ABNORMAL LOW (ref 36.0–46.0)
Hemoglobin: 6.3 g/dL — CL (ref 12.0–15.0)
Immature Granulocytes: 1 %
Lymphocytes Relative: 40 %
Lymphs Abs: 2.8 10*3/uL (ref 0.7–4.0)
MCH: 30.9 pg (ref 26.0–34.0)
MCHC: 30.9 g/dL (ref 30.0–36.0)
MCV: 100 fL (ref 80.0–100.0)
Monocytes Absolute: 0.8 10*3/uL (ref 0.1–1.0)
Monocytes Relative: 12 %
Neutro Abs: 2.8 10*3/uL (ref 1.7–7.7)
Neutrophils Relative %: 41 %
Platelets: 61 10*3/uL — ABNORMAL LOW (ref 150–400)
RBC: 2.04 MIL/uL — ABNORMAL LOW (ref 3.87–5.11)
RDW: 26.1 % — ABNORMAL HIGH (ref 11.5–15.5)
WBC: 6.8 10*3/uL (ref 4.0–10.5)
nRBC: 0.3 % — ABNORMAL HIGH (ref 0.0–0.2)

## 2021-07-26 LAB — PHOSPHORUS: Phosphorus: 4.6 mg/dL (ref 2.5–4.6)

## 2021-07-26 LAB — PROTIME-INR
INR: 2.6 — ABNORMAL HIGH (ref 0.8–1.2)
Prothrombin Time: 27.6 seconds — ABNORMAL HIGH (ref 11.4–15.2)

## 2021-07-26 LAB — MAGNESIUM: Magnesium: 2.6 mg/dL — ABNORMAL HIGH (ref 1.7–2.4)

## 2021-07-26 LAB — AMMONIA: Ammonia: 73 umol/L — ABNORMAL HIGH (ref 9–35)

## 2021-07-26 MED ORDER — SODIUM CHLORIDE 0.9% IV SOLUTION: Freq: Once | INTRAVENOUS | Status: AC

## 2021-07-26 MED ORDER — ZINC OXIDE 40 % EX OINT
TOPICAL_OINTMENT | CUTANEOUS | Status: DC
  Administered 2021-07-26 – 2021-07-29 (×2): 1 via TOPICAL
  Filled 2021-07-26: qty 57

## 2021-07-26 MED ORDER — HYDROMORPHONE HCL 1 MG/ML IJ SOLN
0.5000 mg | INTRAMUSCULAR | Status: DC | PRN
  Administered 2021-07-28: 0.5 mg via INTRAVENOUS
  Filled 2021-07-26: qty 0.5

## 2021-07-26 MED ORDER — ALBUMIN HUMAN 25 % IV SOLN
25.0000 g | Freq: Four times a day (QID) | INTRAVENOUS | Status: AC
  Administered 2021-07-26 – 2021-07-27 (×4): 25 g via INTRAVENOUS
  Filled 2021-07-26 (×4): qty 100

## 2021-07-26 NOTE — Progress Notes (Signed)
Date and time results received: 07/26/21 0445   Test: Hgb Critical Value: 6.3  Name of Provider Notified: X. Blount NP  Orders Received? Or Actions Taken?: Orders Received - See Orders for details

## 2021-07-26 NOTE — Consult Note (Signed)
Renal Service Consult Note [facility]  Sally Wise 07/26/2021 Sally Blazing, MD Requesting Physician: Dr. Loletha Wise. Sally Wise  Reason for Consult: Renal failure  HPI: The patient is a 76 y.o. year-old w/ hx of hypothyroid, LVH, morbid obesity, PAF who presented 07/09/21 w/ gen weakness and lethargy, low appetite and a little "foggy" per the husband. In ED CXR showed new LLL opacity. UA negative, +lactic acid. She rec'd IVF's and Rocephin and was admitted for acute metabolic encephalopathy w/ dehydration, hypotension, poss LLL PNA and anasarca. Hx of chronic anemia and thrombocytopenia as well. Creat 1.17 on admission and has worsened up to 1.82 on 1/10, down to 1.59 today. Anasarca has been refractory here. BP's are low, NH3 up and ECHO showd G1DD, normal EF. UA w/o proteinuria. Asked to see for renal failure.    Pt seen in room. No c/o today. Denies hx of kidney problems.   ROS - denies CP, no joint pain, no HA, no blurry vision, no rash, no diarrhea, no nausea/ vomiting, no dysuria, no difficulty voiding   Past Medical History  Past Medical History:  Diagnosis Date   Hematuria    evaluation with Dr. 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 Remote hx, re-established care 01/2013 with Dr. Johnsie Wise for recurrence. 2D echo 01/2013: mod LVH, EF 55-65%, no RWMA, grade 1 d/d. b. 06/2014: started on Xarelto.    Thrombocytopenia ([place])    Vitamin D deficiency 06/13/2010   Wears glasses    Past Surgical History  Past Surgical History:  Procedure Laterality Date   BREAST EXCISIONAL BIOPSY  2010   left--was a vascular lesion   CARPAL TUNNEL RELEASE Left 08/09/2013   Procedure: LEFT CARPAL TUNNEL RELEASE;  Surgeon: Sally Wise., MD;  Location: [facility];  Service:  Orthopedics;  Laterality: Left;   CATARACT EXTRACTION     COLONOSCOPY  04/2003       DILATION AND CURETTAGE OF UTERUS     ESOPHAGOGASTRODUODENOSCOPY (EGD) WITH PROPOFOL N/A 07/12/2021   Procedure: ESOPHAGOGASTRODUODENOSCOPY (EGD) WITH PROPOFOL;  Surgeon: Ronnette Juniper, MD;  Location: [facility];  Service: Gastroenterology;  Laterality: N/A;   HYSTEROSCOPY WITH D & C  8/99   ORIF ANKLE FRACTURE Left 05/07/2021   Procedure: OPEN REDUCTION INTERNAL FIXATION (ORIF) ANKLE FRACTURE;  Surgeon: Wylene Simmer, MD;  Location: [facility];  Service: Orthopedics;  Laterality: Left;   REPLACEMENT TOTAL KNEE Bilateral 12/2006  07/2007    TONSILLECTOMY     TRIGGER FINGER RELEASE Left 08/09/2013   Procedure: LEFT A1/A2 PULLEY RELEASE;  Surgeon: Sally Wise., MD;  Location: [place];  Service: Orthopedics;  Laterality: Left;   TUBAL LIGATION  5/77   Family History  Family History  Problem Relation Age of Onset   Hypertension Mother    Kidney disease Mother    Alzheimer's disease Father    Diabetes Brother    Hypertension Brother    Spina bifida Daughter    Social History  reports that she has never smoked. She has never used smokeless tobacco. She reports that she does not drink alcohol and does not use drugs. Allergies No Known Allergies Home medications Prior to Admission medications   Medication Sig Start Date End Date Taking? Authorizing Provider  ascorbic  acid (VITAMIN C) 500 MG tablet Take 1 tablet (500 mg total) by mouth 2 (two) times daily. 06/28/21  Yes Lavina Hamman, MD  bisacodyl 5 MG EC tablet Take 5 mg by mouth daily as needed for moderate constipation.   Yes [provider]  Cholecalciferol (VITAMIN D3) 5000 UNITS TABS Take 1 tablet by mouth daily.   Yes [provider]  escitalopram (LEXAPRO) 5 MG tablet Take 5 mg by mouth at bedtime. 07/02/21  Yes [provider]  ferrous sulfate 325 (65 FE) MG tablet Take 325 mg by mouth daily with  breakfast.   Yes [provider]  fludrocortisone (FLORINEF) 0.1 MG tablet Take 100 mcg by mouth daily. 07/08/21  Yes [provider]  folic acid (FOLVITE) 1 MG tablet Take 1 tablet (1 mg total) by mouth daily. 03/17/21  Yes Mercy Riding, MD  HYDROcodone-acetaminophen (NORCO/VICODIN) 5-325 MG tablet Take 1 tablet by mouth every 6 (six) hours as needed for moderate pain or severe pain. 06/28/21  Yes Lavina Hamman, MD  lactobacillus acidophilus & bulgar (LACTINEX) chewable tablet Chew 2 tablets by mouth 3 (three) times daily. 06/28/21  Yes Lavina Hamman, MD  levothyroxine (EUTHYROX) 125 MCG tablet Take 1 tablet (125 mcg total) by mouth daily before breakfast. 06/29/21  Yes Lavina Hamman, MD  nutrition supplement, JUVEN, (JUVEN) PACK Take 1 packet by mouth 2 (two) times daily between meals. Patient taking differently: Take 1 packet by mouth 2 (two) times daily. Midday and bedtime 06/28/21  Yes Lavina Hamman, MD  Nutritional Supplements (,FEEDING SUPPLEMENT, PROSOURCE PLUS) liquid Take 30 mLs by mouth 2 (two) times daily between meals. 06/28/21  Yes Lavina Hamman, MD  ondansetron (ZOFRAN) 4 MG tablet Take 4 mg by mouth every 8 (eight) hours as needed for nausea or vomiting.   Yes [provider]  zinc sulfate 220 (50 Zn) MG capsule Take 1 capsule (220 mg total) by mouth daily. 06/29/21  Yes Lavina Hamman, MD  feeding supplement (ENSURE ENLIVE / ENSURE PLUS) LIQD Take 237 mLs by mouth 2 (two) times daily between meals. Patient not taking: Reported on 07/10/2021 06/28/21   Lavina Hamman, MD  mirtazapine (REMERON) 15 MG tablet Take 1 tablet (15 mg total) by mouth at bedtime. Patient not taking: Reported on 07/10/2021 06/28/21   Lavina Hamman, MD  Nystatin (GERHARDT'S BUTT CREAM) CREA Apply 1 application topically 2 (two) times daily. Patient not taking: Reported on 07/10/2021 06/28/21   Lavina Hamman, MD     Vitals:   07/25/21 1132 07/25/21 1346 07/25/21 2202  07/26/21 0521  BP: 90/60 (!) 88/59 128/78 124/60  Pulse:  76 74 74  Resp:  _0 Temp:  (!) 97.4 F (36.3 C) 98 F (36.7 C) 97.9 F (36.6 C)  TempSrc:  Oral Oral Oral  SpO2:  98% 99% 100%  Weight:      Height:       Exam Gen alert, no distress, obese WF bedbound, no distress No rash, cyanosis or gangrene Sclera anicteric, throat clear  No jvd or bruits Chest clear bilat to bases, no rales/ wheezing RRR no MRG Abd soft ntnd no mass or ascites +bs GU deferred MS no joint effusions or deformity Ext diffuse 1-2+ bilat UE edema, bilat 2-3+ hip/ flank edema, no pretib edema Neuro is alert, Ox 3 , nonfocal, sig deconditioning     Home meds include - lexapro, florinef, norco prn, levothyroxine, ensure/ Juven/ prosource  plus, remeron     Date    Creat  eGFR    2008- 2015   0.82- 1.00    2021    1.09- 1.23    Sept 2022   0.97- 1.01 May 2021   0.95- 1.56    Dec 12-18, 2022  2.44 >> 1.31 20 >>  42 ml/min    Dec 28- Jul 26, 2021 1.08- 1.87 27- 53 ml/min           CXR 12/28 - IMPRESSION: 1. Atelectasis and/or consolidation in the left lower lobe with small left pleural effusion. 2. Trace right pleural effusion. 3. Borderline cardiomegaly.    Renal US - 9.4/9.7 cm kidneys w/o hydro   UA 12/28 - negative    UPC ratio was 0.17    UNa < 10, UCr 59 on 07/25/21    CT abd pelvis 12/29 - IMPRESSION: 1. Cardiac enlargement with small pericardial effusion. 2. Small bilateral pleural effusions with basilar atelectasis or consolidation. 3. 5 mm right solid pulmonary nodule...4. Cholelithiasis. 5. Noncalcified structure in the gallbladder is nonspecific and could represent stone, sludge, or mass lesion. 6. Fatty infiltration of the liver. 7. Aortic atherosclerosis. 8. Foley catheter decompresses the bladder. 9. Calcified uterine fibroids.     ECHO  - LVEF 60-65 %, no WMA, G1DD, RV function okay       Na 139  K 4  CO2 21  BUN 84  Cr 1.59  Ca 7.8  phos 4.6  Alb 1.9       NH3 - 48 - 63 - 85 -  73 today        AST/ ALT wnl, Tbili 1.4   Hb 6.3  plt 61k         Hb 6.3   WBC 6K  plt 61k       BP's mostly on lower side 90- 110/ 60s      Room air = 100%       Cortisol and ACTH stim test were wnl      TSH 8.6, normal free T4   Assessment/ Plan: AKI on CKD 3a - b/l creatinine 1.08- 1.31 from dec 2022, eGFR 42- 53 ml/min. Pt admitted w/ anasarca, hypotension, relative hypotension, hyperammonemia, fatty liver and renal failure. UNa is low c/w prerenal. ECHO showed G1DD, normal EF. No sig proteinuria. INR is up at 2-2.5, not on warfarin. There are some signs of liver disease [facility], INR) and heart failure (G1DD). Low UNa would be consistent w/ dCHF abnd/or cirrhosis/ HRS. Do not think kidneys are the primary issue. Consider cardiology input.  If HRS she might benefit from midodrine and IV albumin, which she is already getting, but her overall prognosis would still be very poor with or w/o treatment given her severe debility and malnutrtion.  Hypotension - midodrine has been started,  no signs of sepsis for vol depletion Anasarca - very low albumin + diast HF and/or poss liver disease. Consider GI or cardiology input for these issues.  Debility - pt bedbound since ankle surgery in October Hypoalbuminemia - malnutrition,  liver disease? Depression - seen by psychiatry multiple times this admission      Kelly Splinter  MD 07/26/2021, 5:56 AM  Recent Labs  Lab 07/25/21 0423 07/26/21 0413  WBC 8.9 6.8  HGB 7.3* 6.3*   Recent Labs  Lab 07/25/21 0423 07/26/21 0413  K 4.3 4.0  BUN 90* 84*  CREATININE 1.67* 1.59*  CALCIUM 7.6* 7.8*  PHOS  4.7* 4.6

## 2021-07-26 NOTE — Progress Notes (Signed)
Attempted right upper arm PICC placement, unsuccessful. Excessive fluid made it hard to visualize and cannulate vein. Recommend IR to place. Will continue to monitor.

## 2021-07-26 NOTE — Progress Notes (Signed)
PT Cancellation Note  Patient Details Name: Sally Wise MRN: [id number] DOB: October 23, 1945   Cancelled Treatment:     PT deferred this date, pt with Hgb of 6.3 with transfusion on order.  Will follow.   Debora Stockdale 07/26/2021, 2:02 PM

## 2021-07-26 NOTE — Progress Notes (Signed)
Per Lowell Guitar MD and [place] MD, okay to place picc line.

## 2021-07-26 NOTE — Consult Note (Signed)
WOC Nurse Consult Note: Reason for Consult: perineal and intra labial areas of skin breakdown and tissue loss, full and partial thickness Wound type: moisture vs viral vs irritant contact dermatitis due to stool Pressure Injury POA: Yes/No/NA Measurement:largest visualized lesion measures 2cm x 1.6cm x 0.2cm Wound bed:red, moist Drainage (amount, consistency, odor) serous Periwound:erythematous, edematous Dressing procedure/placement/frequency: An indwelling urinary catheter has been place.  I would also support a fecal management system until stool solidifies as she has stool between her legs at this time and several times throughout the day.  I will provide guidance for cleansing with tepid tap water over a skin cleanser and recommend frequent application of zinc oxide to serve as a moisture barrier. Patient is on a mattress replacement with low air loss feature for persons of bariatric proportions. Turning and repositioning as well as nutritional maximization are essential components of the POC.  WOC nursing team will not follow, but will remain available to this patient, the nursing and medical teams.  Please re-consult if needed. Thanks, Ladona Mow, MSN, RN, GNP, [place]  Pager# [phone]

## 2021-07-26 NOTE — Progress Notes (Signed)
PROGRESS NOTE    Sally Wise  [id number] DOB: 01/07/1946 DOA: 07/10/2021 PCP: [name], MD  Chief Complaint  Patient presents with   Weakness    Brief Narrative:  76 yo with hx atrial fibrillation, hypothyroidism, chronic thrombocytopenia, HFpEF, HLD, HTN, depression and multiple other medical issues who presented with weakness and lethargy.  She's had poor PO intake.  Of note, she fractured her ankle in October 2022 and has been living at [facility] since then.  She was admitted with dehydration, hypotension, and AKI on 12/12 and returned on 12/28 with similar issues.  She was seen by GI on 12/30 for upper endoscopy which showed Eulice Rutledge nonbleeding duodenal ulcer, erythematous mucosa in the gastric body and erythematous duodenopathy.  Labs for H pylori pending and celiac labs are negative.  GI now signed off.  Heme consulted  (now signed off) due to persistently elevated INR.  Palliative was consulted as well.  She remains anasarcic with poor appetite.  At this time, trying to work towards discharge, but complicated by hypotension and AKI.  See below for additional details    Assessment & Plan:   Principal Problem:   Anasarca Active Problems:   Goals of care, counseling/discussion   Anemia   Hypotension   AKI (acute kidney injury) ([place])   Acute metabolic encephalopathy   Lack of appetite   Fatty liver disease, nonalcoholic   Hyperkalemia   PUD (peptic ulcer disease)   Chronic kidney disease with active medical management without dialysis, stage 3 (moderate) (HCC)   Elevated INR   Thrombocytopenia (HCC)   Ankle fracture   Hypoalbuminemia   Pressure ulcer   Contamination of blood culture   Hypothyroidism   Morbid obesity (HCC)   PAF (paroxysmal atrial fibrillation) (HCC)   * Anasarca- (present on admission) Chronic. Likely related to chronic hypoalbuminemia.  Foley catheter placed on 07/11/2021 and removed on 07/19/2021. Echo with normal EF, grade I diastolic  dysfunction Urine without notable proteinuria (UP/C 0.17) With hypotension now as well Follow urine sodium as above Nephrology c/s, appreciate recs - recommending continuing albumin and midodrine  Cardiology c/s - noted edema not cardiac, recommended enteral feeding or TPN   Goals of care, counseling/discussion Difficult situation.  Her main issues right now are her lack of PO intake, anasarca, anemia, hypoalbuminemia, hypotension, and acute kidney injury - her labs suggest poor synthetic function of the liver (though imaging shows steatosis).  Her continued poor PO intake limits her potential for recovery and without nutrition, she will continue to decline.  We discussed today importance of nutrition and options regarding this (trial of NG/panda tube, PEG tube, or continuing without Statia Burdick plan for tube feeding -> and with that, Bolden Hagerman focus on comfort).  I think tube feeding reasonable option to try.  She continues to decline this.  We discussed if no desire to try tube feeding, then would consider Edman Lipsey transition to focus on comfort/hospice.  I'll ask palliative to help [place] discuss this further, I've added pain medicines today to help with some of the wounds that she has that are causing her significant discomfort.  Hopefully this will help with her comfort while she remains inpatient.    AKI (acute kidney injury) ([place]) Creatinine up to 1.8. Likely secondary to poor oral intake, but complicated with inability to document accurate urine output in addition to anasarca. Unable to obtain daily weights secondary to lack of bed function but will double check. Patient unable to perform standing weights -Renal ultrasound  to rule out retention etiology - without hydro -diuresis on hold with low SBP -as above, will discuss with renal, follow urine sodium (low)  - will continue midodrine and IV albumin  Hypotension- (present on admission) Improved with albumin and midodrine She remains diffusely edematous with  hypoalbuminemia, though suspect she's intravascularly dry with Ardelia Mems <10 Continue albumin Transfuse pRBC's prn Continue midodrine Follow BP closely Follow urine sodium (low)  Anemia- (present on admission) Hb 6.3 today Transfuse 1 unit PRBC and follow No bleeding noted, follow  Hyperkalemia Hemolysis, improved  Fatty liver disease, nonalcoholic Imaging showed fatty liver -> her constellation of findings make me concerned/suspicious regarding her synthetic liver function and presence of cirrhosis?  High INR despite vit K, thrombocytopenia, hypoalbuminemia, mildly elevated bilirubin Likely diagnosis from recent abdominal ultrasound and more recent CT abdomen/pelvis. Patient would benefit from outpatient hepatology. Consider additional liver imaging (MRI/elastography?) and/or liver biopsy - will hold off for now with continued poor PO intake, planning additional goc conversations as noted above  Lack of appetite Unclear cause. Dietitian consulted. Psychiatry consulted. Patient started on Remeron to help with appetite. Per patient, she has no decrease in appetite but has particular eating habits. Patient declines enteral nutrition via feeding tube. GI consulted and patient underwent upper endoscopy with erythematous duodenopathy with associated non-bleeding duodenal ulcer with clean base. -Continue Remeron -per RD note 1/12, consuming 0% of meals, drinking some fluids, accepted 1 prosource in last 4 days, 1 ensure in last 4 days, 3 beneprotein on 1/11.  She's continuing to decline tube feeding.  Acute metabolic encephalopathy Mild. Per husband, this has been Franke Menter persistent issue.  TSH of 8.6 with normal free T4. Ammonia remains elevated. She's currently having diarrhea, hold lactulose for now with stable mental status, no asterixis. Vitamin B12 and folate are within normal limits.  Cortisol normal with Keishaun Hazel normal ACTH stim test.  Psychiatry evaluated and recommended transition from Lexapro to  Remeron.  MRI brain without acute process. Chronic ischemic changes noted. Discussed with neurology and findings do not explain patient presentation; dural thickening acknowledged but recommendation for no further workup per neurology unless neurologic symptoms arose.  PUD (peptic ulcer disease) EGD with erythematous mucosa in gastric body, erythematous duodenopathy, nonbleeding duodenal ulcer with clean base. H. Pylori and celiac serology negative (negative TTG, reticulin ab, and gliadin ab). GI recommending Protonix 40 mg BID x1 month and sucralfate suspension 1 g PO QID x2 weeks.  -Re-order h pylori stool ab (pending)  Elevated INR Unclear etiology. Hematology consulted and have recommended IV vitamin K. Treatment completed. Follow INR - remains elevated  Chronic kidney disease with active medical management without dialysis, stage 3 (moderate) ([place])- (present on admission) Baseline creatinine of about 1.2  Thrombocytopenia ([place])- (present on admission) Chronic and stable. Likely related to underlying liver disease.  Ankle fracture- (present on admission) Left ankle. History of ORIF on recent admission on 05/07/21. Max weight bearing of 75 lbs in boot for one month.  Hypoalbuminemia- (present on admission) Possibly secondary to poor nutrition vs liver disease. Stable.  Pressure ulcer Pressure Injury 06/25/21 Coccyx Mid;Lower Stage 3 -  Full thickness tissue loss. Subcutaneous fat may be visible but bone, tendon or muscle are NOT exposed. 1.5 cm by 1 cm (Active)  06/25/21 0130  Location: Coccyx  Location Orientation: Mid;Lower  Staging: Stage 3 -  Full thickness tissue loss. Subcutaneous fat may be visible but bone, tendon or muscle are NOT exposed.  Wound Description (Comments): 1.5 cm by 1 cm  Present on Admission: Yes     Pressure Injury Pretibial Distal;Left Unstageable - Full thickness tissue loss in which the base of the injury is covered by slough (yellow, tan, gray, green or  brown) and/or eschar (tan, brown or black) in the wound bed. 2 cm x 1 cm (Active)     Location: Pretibial  Location Orientation: Distal;Left  Staging: Unstageable - Full thickness tissue loss in which the base of the injury is covered by slough (yellow, tan, gray, green or brown) and/or eschar (tan, brown or black) in the wound bed.  Wound Description (Comments): 2 cm x 1 cm  Present on Admission: Yes     Pressure Injury 07/10/21 Groin Medial Stage 1 -  Intact skin with non-blanchable redness of Allee Busk localized area usually over Leonetta Mcgivern bony prominence. medical equipment PI: lines of nonblanchable redness where purewick tubing might have been (Active)  07/10/21 1906  Location: Groin  Location Orientation: Medial  Staging: Stage 1 -  Intact skin with non-blanchable redness of Lema Heinkel localized area usually over Neila Teem bony prominence.  Wound Description (Comments): medical equipment PI: lines of nonblanchable redness where purewick tubing might have been  Present on Admission: Yes   Appreciate wound care recs - I think some of this may have been related to initial foley placement given appearance, will continue to monitor closely     Contamination of blood culture 1 of 2 from 12/28 with staph epidermidis. Likely contaminant.  Morbid obesity ([place])- (present on admission) Body mass index is 44.25 kg/m.  Dietitian recommendations: 48 hour Calorie Count (07/19/2021) Ensure Enlive po BID, each supplement provides 350 kcal and 20 grams of protein Prosource Plus PO BID, each provides 100 kcals and 15g protein Magic cup TID with meals, each supplement provides 290 kcal and 9 grams of protein  Beneprotein powder TID, each provides 25 kcals and 6g protein. Encouraged PO intakes and supplements  Hypothyroidism- (present on admission) Synthroid recently increased from 112 mcg to 125 mcg (on 12/19 dc summary noted). TSH slightly improved this admission. -Continue Synthroid -Repeat TSH in 2-3 weeks; increase  Synthroid if TSH still elevated     DVT prophylaxis: SCD Code Status: full Family Communication: husband at bedside Disposition:   Status is: Inpatient  Remains inpatient appropriate because: plan for d/c       Consultants:  GI Palliative care Oncology Psychiatry Cardiology nephrology  Procedures:  UPPER GI ENDOSCOPY (07/12/2021) Impression:               - Normal esophagus.                           - Erythematous mucosa in the gastric body.                           - Erythematous duodenopathy.                           - Non-bleeding duodenal ulcer with Haizley Cannella clean ulcer                            base ([place] Class III).                           - No specimens collected.   Recommendation:           -  Resume regular diet.                           - Use Protonix (pantoprazole) 40 mg PO BID for 1                            month.                           - Use sucralfate suspension 1 gram PO QID for 2                            weeks.                           - Stool for H pylori Antigen and celiac serology.  Antimicrobials:  Anti-infectives (From admission, onward)    Start     Dose/Rate Route Frequency Ordered Stop   07/11/21 0800  cefTRIAXone (ROCEPHIN) 2 g in sodium chloride 0.9 % 100 mL IVPB  Status:  Discontinued        2 g 200 mL/hr over 30 Minutes Intravenous Every 24 hours 07/10/21 1807 07/11/21 0906   07/10/21 0700  cefTRIAXone (ROCEPHIN) 2 g in sodium chloride 0.9 % 100 mL IVPB        2 g 200 mL/hr over 30 Minutes Intravenous  Once 07/10/21 0654 07/10/21 0816       Subjective: Continues to be frustrated She doesn't want to do the tube feeding Long discussion with her and husband  Objective: Vitals:   07/26/21 1520 07/26/21 1526 07/26/21 1527 07/26/21 1752  BP: 120/63 115/65 115/65 (!) 91/59  Pulse: 78 76 76 80  Resp: 18 18 18 18   Temp: 97.7 F (36.5 C) 98.4 F (36.9 C) 98.4 F (36.9 C) 97.8 F (36.6 C)  TempSrc: Oral  Oral Oral   SpO2: 100%  100% 99%  Weight:      Height:        Intake/Output Summary (Last 24 hours) at 07/26/2021 1803 Last data filed at 07/26/2021 1754 Gross per 24 hour  Intake 1158.17 ml  Output 1251 ml  Net -92.83 ml   Filed Weights   07/16/21 0500 07/23/21 1058 07/26/21 0521  Weight: 132 kg (!) 168.7 kg (!) 165.6 kg    Examination:  General: No acute distress. Cardiovascular: RRR Lungs: unlabored Abdomen: Soft, nontender, nondistended  Neurological: Alert. Moves all extremities 4. Cranial nerves II through XII grossly intact. Skin: scattered bruising Extremities: anasarca     Data Reviewed: I have personally reviewed following labs and imaging studies  CBC: Recent Labs  Lab 07/22/21 1106 07/25/21 0423 07/26/21 0413  WBC 12.1* 8.9 6.8  NEUTROABS  --  3.5 2.8  HGB 7.8* 7.3* 6.3*  HCT 25.3* 24.1* 20.4*  MCV 98.8 104.8* 100.0  PLT 86* 82* 61*    Basic Metabolic Panel: Recent Labs  Lab 07/22/21 0949 07/24/21 0526 07/24/21 1918 07/25/21 0423 07/26/21 0413  NA 138 136 137 138 139  K 4.5 5.9* 4.7 4.3 4.0  CL 111 110 110 110 109  CO2 18* 18* 19* 19* 21*  GLUCOSE 117* 82 115* 82 77  BUN 89* 90* 93* 90* 84*  CREATININE 1.82* 1.77* 1.87* 1.67* 1.59*  CALCIUM 7.5* 7.4* 7.5* 7.6* 7.8*  MG  --   --   --  2.4 2.6*  PHOS  --   --   --  4.7* 4.6    GFR: Estimated Creatinine Clearance: 48.9 mL/min (Kofi Murrell) (by C-G formula based on SCr of 1.59 mg/dL (H)).  Liver Function Tests: Recent Labs  Lab 07/25/21 0423 07/26/21 0413  AST 19 19  ALT 22 21  ALKPHOS 318* 279*  BILITOT 1.4* 1.4*  PROT 4.9* 4.5*  ALBUMIN 1.9* 2.1*    CBG: No results for input(s): GLUCAP in the last 168 hours.   No results found for this or any previous visit (from the past 240 hour(s)).       Radiology Studies: [place] EKG SITE RITE  Result Date: 07/26/2021 If Site Rite image not attached, placement could not be confirmed due to current cardiac rhythm.       Scheduled Meds:   (feeding supplement) PROSource Plus  30 mL Oral BID BM   sodium chloride   Intravenous Once   ascorbic acid  500 mg Oral BID   Chlorhexidine Gluconate Cloth  6 each Topical Daily   cholecalciferol  5,000 Units Oral Daily   feeding supplement  237 mL Oral BID BM   folic acid  1 mg Oral Daily   lactobacillus acidophilus & bulgar  2 tablet Oral TID   levothyroxine  125 mcg Oral Q0600   liver oil-zinc oxide   Topical Q4H   midodrine  5 mg Oral TID WC   mirtazapine  15 mg Oral QHS   multivitamin with minerals  1 tablet Oral Daily   pantoprazole  40 mg Oral BID AC   protein supplement  1 Scoop Oral TID WC   sucralfate  1 g Oral TID WC & HS   Continuous Infusions:  albumin human 25 g (07/26/21 1114)     LOS: 15 days    Time spent: over 30 min    Fayrene Helper, MD [facility]   To contact the attending provider between 7A-7P or the covering provider during after hours 7P-7A, please log into the web site [email or web address] and access using universal [place] password for that web site. If you do not have the password, please call the hospital operator.  07/26/2021, 6:03 PM

## 2021-07-26 NOTE — Consult Note (Signed)
Cardiology Consultation:   Patient ID: KETTI MEMORY MRN: [id number]; DOB: 1945/10/03  Admit date: 07/10/2021 Date of Consult: 07/26/2021  PCP:  [name], MD   [facility] Cardiologist:  Jenkins Rouge, MD       Patient Profile:   Sally Wise is a 76 y.o. female with a hx of PAF, chronic anemia, thrombocytopenia, morbid obesity, nl MV 2017, and hypothyroidism, who is being seen 07/26/2021 for the evaluation of anasarca at the request of Dr Florene Glen.  History of Present Illness:   Sally Wise was admitted 12/12-12/19-2022 for ARF on CKD 3a, weakness 2nd poor po intake, anemia s/p 2 U PRBCs. BUN/Cr 52/1.31, Plt 30, H&H 8.3/26.6. Initially COVID neg, but on 12/16, was COVID POSITIVE  Pt admitted 12/28 w/ weakness, confusion, hypotension, ?FTT. Lactic acid 2.6, bun/cr 110/1.17, H&H 10.0/32.0, INR 2.1, Alb 1.9, Ca 7.7, UA ok, FOB +, CXR w/ LLL opacity.   GI saw for heme +stools, gastritis on EGD. Po intake has remained poor.   Psych saw Lexapro >> Remeron.  Palliative Care saw, Full Code.   She is felt to have anasarca w/ hypoalbuminemia. Has been given mult infusions albumin.  BP and BUN/Cr elevation have limited Lasix use. Got 20 mg IV on 01/10.   SBP 70s at times, midodrine 5 mg tid started on 01/12.   Nephrology saw 01/12 for refractory anasarca w/ elevated ammonia levels. INR also elevated. No proteinuria. Dr Melvia Heaps suggested Cards input as he does not feel kidneys are the primary problem.   She is extremely weak, also having issues with depression.  She says she wants to get better   Past Medical History:  Diagnosis Date   Hematuria    evaluation with Dr. Joelyn Oms   Hypothyroidism 06/13/2010   Kidney stone [id number]   Lichen sclerosus et atrophicus 08/15/2011   biopsy proven   LVH (left ventricular hypertrophy)    Morbid obesity (HCC)    PAF (paroxysmal atrial fibrillation) ([place])    a. Remote hx, re-established care 01/2013 with Dr. Johnsie Cancel for  recurrence. 2D echo 01/2013: mod LVH, EF 55-65%, no RWMA, grade 1 d/d. b. 06/2014: started on Xarelto.    Thrombocytopenia ([place])    Vitamin D deficiency 06/13/2010   Wears glasses     Past Surgical History:  Procedure Laterality Date   BREAST EXCISIONAL BIOPSY  2010   left--was a vascular lesion   CARPAL TUNNEL RELEASE Left 08/09/2013   Procedure: LEFT CARPAL TUNNEL RELEASE;  Surgeon: Cammie Sickle., MD;  Location: [facility];  Service: Orthopedics;  Laterality: Left;   CATARACT EXTRACTION     COLONOSCOPY  04/2003       DILATION AND CURETTAGE OF UTERUS     ESOPHAGOGASTRODUODENOSCOPY (EGD) WITH PROPOFOL N/A 07/12/2021   Procedure: ESOPHAGOGASTRODUODENOSCOPY (EGD) WITH PROPOFOL;  Surgeon: Ronnette Juniper, MD;  Location: [facility];  Service: Gastroenterology;  Laterality: N/A;   HYSTEROSCOPY WITH D & C  8/99   ORIF ANKLE FRACTURE Left 05/07/2021   Procedure: OPEN REDUCTION INTERNAL FIXATION (ORIF) ANKLE FRACTURE;  Surgeon: Wylene Simmer, MD;  Location: [facility];  Service: Orthopedics;  Laterality: Left;   REPLACEMENT TOTAL KNEE Bilateral 12/2006  07/2007    TONSILLECTOMY     TRIGGER FINGER RELEASE Left 08/09/2013   Procedure: LEFT A1/A2 PULLEY RELEASE;  Surgeon: Cammie Sickle., MD;  Location: [facility];  Service: Orthopedics;  Laterality: Left;   TUBAL LIGATION  5/77     Home Medications:  Prior to Admission medications   Medication Sig Start Date End Date Taking? Authorizing Provider  ascorbic acid (VITAMIN C) 500 MG tablet Take 1 tablet (500 mg total) by mouth 2 (two) times daily. 06/28/21  Yes Lavina Hamman, MD  bisacodyl 5 MG EC tablet Take 5 mg by mouth daily as needed for moderate constipation.   Yes [provider]  Cholecalciferol (VITAMIN D3) 5000 UNITS TABS Take 1 tablet by mouth daily.   Yes [provider]  escitalopram (LEXAPRO) 5 MG tablet Take 5 mg by mouth at bedtime. 07/02/21  Yes [provider]  ferrous  sulfate 325 (65 FE) MG tablet Take 325 mg by mouth daily with breakfast.   Yes [provider]  fludrocortisone (FLORINEF) 0.1 MG tablet Take 100 mcg by mouth daily. 07/08/21  Yes [provider]  folic acid (FOLVITE) 1 MG tablet Take 1 tablet (1 mg total) by mouth daily. 03/17/21  Yes Mercy Riding, MD  HYDROcodone-acetaminophen (NORCO/VICODIN) 5-325 MG tablet Take 1 tablet by mouth every 6 (six) hours as needed for moderate pain or severe pain. 06/28/21  Yes Lavina Hamman, MD  lactobacillus acidophilus & bulgar (LACTINEX) chewable tablet Chew 2 tablets by mouth 3 (three) times daily. 06/28/21  Yes Lavina Hamman, MD  levothyroxine (EUTHYROX) 125 MCG tablet Take 1 tablet (125 mcg total) by mouth daily before breakfast. 06/29/21  Yes Lavina Hamman, MD  nutrition supplement, JUVEN, (JUVEN) PACK Take 1 packet by mouth 2 (two) times daily between meals. Patient taking differently: Take 1 packet by mouth 2 (two) times daily. Midday and bedtime 06/28/21  Yes Lavina Hamman, MD  Nutritional Supplements (,FEEDING SUPPLEMENT, PROSOURCE PLUS) liquid Take 30 mLs by mouth 2 (two) times daily between meals. 06/28/21  Yes Lavina Hamman, MD  ondansetron (ZOFRAN) 4 MG tablet Take 4 mg by mouth every 8 (eight) hours as needed for nausea or vomiting.   Yes [provider]  zinc sulfate 220 (50 Zn) MG capsule Take 1 capsule (220 mg total) by mouth daily. 06/29/21  Yes Lavina Hamman, MD  feeding supplement (ENSURE ENLIVE / ENSURE PLUS) LIQD Take 237 mLs by mouth 2 (two) times daily between meals. Patient not taking: Reported on 07/10/2021 06/28/21   Lavina Hamman, MD  mirtazapine (REMERON) 15 MG tablet Take 1 tablet (15 mg total) by mouth at bedtime. Patient not taking: Reported on 07/10/2021 06/28/21   Lavina Hamman, MD  Nystatin (GERHARDT'S BUTT CREAM) CREA Apply 1 application topically 2 (two) times daily. Patient not taking: Reported on 07/10/2021 06/28/21   Lavina Hamman,  MD    Inpatient Medications: Scheduled Meds:  (feeding supplement) PROSource Plus  30 mL Oral BID BM   sodium chloride   Intravenous Once   ascorbic acid  500 mg Oral BID   Chlorhexidine Gluconate Cloth  6 each Topical Daily   cholecalciferol  5,000 Units Oral Daily   feeding supplement  237 mL Oral BID BM   folic acid  1 mg Oral Daily   lactobacillus acidophilus & bulgar  2 tablet Oral TID   lactulose  10 g Oral BID   levothyroxine  125 mcg Oral Q0600   midodrine  5 mg Oral TID WC   mirtazapine  15 mg Oral QHS   multivitamin with minerals  1 tablet Oral Daily   pantoprazole  40 mg Oral BID AC   protein supplement  1 Scoop Oral TID WC   sucralfate  1 g Oral TID WC & HS   Continuous Infusions:  albumin human     PRN Meds: acetaminophen **OR** acetaminophen, bisacodyl, HYDROcodone-acetaminophen, liver oil-zinc oxide, ondansetron  Allergies:   No Known Allergies  Social History:   Social History   Socioeconomic History   Marital status: Married    Spouse name: Not on file   Number of children: Not on file   Years of education: Not on file   Highest education level: Not on file  Occupational History   Not on file  Tobacco Use   Smoking status: Never   Smokeless tobacco: Never  Vaping Use   Vaping Use: Never used  Substance and Sexual Activity   Alcohol use: No   Drug use: No   Sexual activity: Yes    Partners: Male    Birth control/protection: Surgical  Other Topics Concern   Not on file  Social History Narrative   Not on file   Social Determinants of Health   Financial Resource Strain: Not on file  Food Insecurity: Not on file  Transportation Needs: Not on file  Physical Activity: Not on file  Stress: Not on file  Social Connections: Not on file  Intimate Partner Violence: Not on file    Family History:   Family History  Problem Relation Age of Onset   Hypertension Mother    Kidney disease Mother    Alzheimer's disease Father    Diabetes Brother     Hypertension Brother    Spina bifida Daughter      ROS:  Please see the history of present illness.  All other ROS reviewed and negative.     Physical Exam/Data:   Vitals:   07/25/21 1132 07/25/21 1346 07/25/21 2202 07/26/21 0521  BP: 90/60 (!) 88/59 128/78 124/60  Pulse:  76 74 74  Resp:  17 14 14   Temp:  (!) 97.4 F (36.3 C) 98 F (36.7 C) 97.9 F (36.6 C)  TempSrc:  Oral Oral Oral  SpO2:  98% 99% 100%  Weight:    (!) 165.6 kg  Height:        Intake/Output Summary (Last 24 hours) at 07/26/2021 0919 Last data filed at 07/26/2021 0746 Gross per 24 hour  Intake 398.74 ml  Output 801 ml  Net -402.26 ml   Last 3 Weights 07/26/2021 07/23/2021 07/16/2021  Weight (lbs) 365 lb 372 lb 291 lb  Weight (kg) 165.563 kg 168.738 kg 131.997 kg     Body mass index is 55.5 kg/m.  General: Morbidly obese, chronically ill-appearing female, in no acute distress HEENT: normal for age Neck: no JVD Vascular: No carotid bruits; Distal pulses 1-2+ bilaterally Cardiac:  normal S1, S2; RRR; no murmur  Lungs:  clear to auscultation bilaterally, no wheezing, rhonchi or rales  Abd: Morbidly obese, soft, nontender, no hepatomegaly  Ext: no edema Musculoskeletal:  No deformities, BUE and BLE strength weak but equal Skin: warm and dry  Neuro:  CNs 2-12 intact, no focal abnormalities noted Psych:  Normal affect   EKG:  The EKG was personally reviewed and demonstrates:   12/15 ECG is sinus rhythm, heart rate 74, low voltage.  Low voltage is a chronic issue since 10/20, lower than her 10/16 ECG  Telemetry:  Telemetry was personally reviewed and demonstrates: Not on telemetry  Relevant CV Studies:  ECHO: 07/12/2021  1. Left ventricular ejection fraction, by estimation, is 60 to 65%. The  left ventricle has normal function. The left ventricle has no regional  wall motion abnormalities. Left ventricular diastolic parameters are  consistent with Grade I diastolic  dysfunction (impaired  relaxation).   2. Right ventricular systolic function is normal. The right ventricular  size is normal. Tricuspid regurgitation signal is inadequate for assessing  PA pressure.   3. A small pericardial effusion is present. The pericardial effusion is  circumferential.   4. The mitral valve is grossly normal. No evidence of mitral valve  regurgitation. No evidence of mitral stenosis.   5. The aortic valve is tricuspid. Aortic valve regurgitation is not  visualized. No aortic stenosis is present.   6. The inferior vena cava is normal in size with greater than 50%  respiratory variability, suggesting right atrial pressure of 3 mmHg.   MYOVIEW: 08/08/2015 Nuclear stress EF: 72%. There is a large defect of moderate severity present in the basal inferoseptal, basal inferior, basal inferolateral, mid inferoseptal, mid inferior, mid inferolateral and apical inferior location. This is most consistent with diaphragmatic attenuation. This is a low risk study. There is no evidence of ischemia or infarction  Laboratory Data:  High Sensitivity Troponin:  No results for input(s): TROPONINIHS in the last 720 hours.   Chemistry Recent Labs  Lab 07/24/21 1918 07/25/21 0423 07/26/21 0413  NA 137 138 139  K 4.7 4.3 4.0  CL 110 110 109  CO2 19* 19* 21*  GLUCOSE 115* 82 77  BUN 93* 90* 84*  CREATININE 1.87* 1.67* 1.59*  CALCIUM 7.5* 7.6* 7.8*  MG  --  2.4 2.6*  GFRNONAA 27* 31* 33*  ANIONGAP 8 9 9     Recent Labs  Lab 07/25/21 0423 07/26/21 0413  PROT 4.9* 4.5*  ALBUMIN 1.9* 2.1*  AST 19 19  ALT 22 21  ALKPHOS 318* 279*  BILITOT 1.4* 1.4*   Lipids No results for input(s): CHOL, TRIG, HDL, LABVLDL, LDLCALC, CHOLHDL in the last 168 hours.  Hematology Recent Labs  Lab 07/22/21 1106 07/25/21 0423 07/26/21 0413  WBC 12.1* 8.9 6.8  RBC 2.56* 2.30* 2.04*  HGB 7.8* 7.3* 6.3*  HCT 25.3* 24.1* 20.4*  MCV 98.8 104.8* 100.0  MCH 30.5 31.7 30.9  MCHC 30.8 30.3 30.9  RDW 25.2* 26.5* 26.1*   PLT 86* 82* 61*   BNP    Component Value Date/Time   BNP 213.6 (H) 06/03/2020 1251   Lab Results  Component Value Date   TSH 8.603 (H) 07/11/2021   Ammonia  Date Value Ref Range Status  07/26/2021 73 (H) 9 - 35 umol/L Final    Comment:    Performed at [facility], [place] [address] [place], [place] [address]  07/25/2021 85 (H) 9 - 35 umol/L Final    Comment:    Performed at [facility], [place] [address] [place], [place] [address]  07/24/2021 91 (H) 9 - 35 umol/L Final    Comment:    Performed at [facility], [place] [address], [place], [place] [address]  07/16/2021 63 (H) 9 - 35 umol/L Final    Comment:    Performed at [facility], [place] [address], [place], [place] [address]  07/14/2021 48 (H) 9 - 35 umol/L Final    Comment:    Performed at [facility], [place] [address], [place], [place] [address]     Radiology/Studies:  US RENAL  Result Date: 08/07/21 CLINICAL DATA:  Renal dysfunction EXAM: RENAL / URINARY TRACT ULTRASOUND COMPLETE COMPARISON:  CT done on 07/11/2021 FINDINGS: Right Kidney: Renal measurements:  9.4 x 5.1 x 4.8 cm = volume: 120 mL. Echogenicity within normal limits. No mass or hydronephrosis visualized. Left Kidney: Renal measurements: 9.7 x 6 x 4.5 cm = volume: 137.7 mL. Echogenicity within normal limits. No mass or hydronephrosis visualized. Bladder: Appears normal for degree of bladder distention. Other: Small ascites is present. IMPRESSION: There is no hydronephrosis. Electronically Signed   By: Elmer Picker M.D.   On: 07/23/2021 16:52     Assessment and Plan:   Anasarca -In the setting of poor p.o. intake essentially since going to the nursing home after her ankle surgery last October -Albumin was 1.9 and has been supplemented, now 2.1 - Alkaline phosphatase has been elevated, peak 319, now 279 - Other LFTs are normal -Ammonia level was mildly elevated  on admission at 48, peak 91, now 73 - Creatinine 1.17 on admission, peak 1.87, now 1.59 with BUN 84 - Urine sodium less than 10, serum sodium has been normal -Magnesium has been elevated, peak 2.6 -Calcium was down to 7.4, 7.8 today - Renal ultrasound without hydronephrosis - According to bed weights, she is up 48 pounds since admission 12/28 -She did get 1 dose of Lasix 20 mg IV, unable to determine response as intake and output measurements are not complete -Liquid intake is poor - Because there was concern that she was third spacing with intravascular volume depletion, diuretics were stopped and she was started on midodrine - Discuss options with MD.    Risk Assessment/Risk Scores:       New York Heart Association (NYHA) Functional Class NYHA Class III     For questions or updates, please contact [facility] Please consult [email or web address] for contact info under    Signed, Rosaria Ferries, PA-C  07/26/2021 9:19 AM

## 2021-07-26 NOTE — Assessment & Plan Note (Addendum)
Appreciate palliative cares assistance with this difficult case.  Sally Wise being transitioned to comfort measures today after discussion with palliative (Mr. Bihl notes he'd discussed decision with daughters).

## 2021-07-26 NOTE — Care Management Important Message (Signed)
Important Message  Patient Details IM Letter given to the Patient. Name: Sally Wise MRN: [id number] Date of Birth: 1946/02/27   Medicare Important Message Given:  Yes     Kerin Salen 07/26/2021, 12:14 PM

## 2021-07-27 ENCOUNTER — Inpatient Hospital Stay (HOSPITAL_COMMUNITY): Payer: Medicare Other

## 2021-07-27 DIAGNOSIS — Z7189 Other specified counseling: Secondary | ICD-10-CM

## 2021-07-27 LAB — CBC WITH DIFFERENTIAL/PLATELET
Abs Immature Granulocytes: 0.07 10*3/uL (ref 0.00–0.07)
Basophils Absolute: 0 10*3/uL (ref 0.0–0.1)
Basophils Relative: 0 %
Eosinophils Absolute: 0.3 10*3/uL (ref 0.0–0.5)
Eosinophils Relative: 4 %
HCT: 22.9 % — ABNORMAL LOW (ref 36.0–46.0)
Hemoglobin: 7 g/dL — ABNORMAL LOW (ref 12.0–15.0)
Immature Granulocytes: 1 %
Lymphocytes Relative: 44 %
Lymphs Abs: 3 10*3/uL (ref 0.7–4.0)
MCH: 30.6 pg (ref 26.0–34.0)
MCHC: 30.6 g/dL (ref 30.0–36.0)
MCV: 100 fL (ref 80.0–100.0)
Monocytes Absolute: 1 10*3/uL (ref 0.1–1.0)
Monocytes Relative: 14 %
Neutro Abs: 2.5 10*3/uL (ref 1.7–7.7)
Neutrophils Relative %: 37 %
Platelets: 57 10*3/uL — ABNORMAL LOW (ref 150–400)
RBC: 2.29 MIL/uL — ABNORMAL LOW (ref 3.87–5.11)
RDW: 25.9 % — ABNORMAL HIGH (ref 11.5–15.5)
WBC: 6.9 10*3/uL (ref 4.0–10.5)
nRBC: 0.3 % — ABNORMAL HIGH (ref 0.0–0.2)

## 2021-07-27 LAB — COMPREHENSIVE METABOLIC PANEL
ALT: 22 U/L (ref 0–44)
AST: 19 U/L (ref 15–41)
Albumin: 2.8 g/dL — ABNORMAL LOW (ref 3.5–5.0)
Alkaline Phosphatase: 259 U/L — ABNORMAL HIGH (ref 38–126)
Anion gap: 7 (ref 5–15)
BUN: 83 mg/dL — ABNORMAL HIGH (ref 8–23)
CO2: 22 mmol/L (ref 22–32)
Calcium: 8.1 mg/dL — ABNORMAL LOW (ref 8.9–10.3)
Chloride: 114 mmol/L — ABNORMAL HIGH (ref 98–111)
Creatinine, Ser: 1.37 mg/dL — ABNORMAL HIGH (ref 0.44–1.00)
GFR, Estimated: 40 mL/min — ABNORMAL LOW (ref >60)
Glucose, Bld: 85 mg/dL (ref 70–99)
Potassium: 3.6 mmol/L (ref 3.5–5.1)
Sodium: 143 mmol/L (ref 135–145)
Total Bilirubin: 1.7 mg/dL — ABNORMAL HIGH (ref 0.3–1.2)
Total Protein: 4.9 g/dL — ABNORMAL LOW (ref 6.5–8.1)

## 2021-07-27 LAB — PROTIME-INR
INR: 2.4 — ABNORMAL HIGH (ref 0.8–1.2)
Prothrombin Time: 26.2 seconds — ABNORMAL HIGH (ref 11.4–15.2)

## 2021-07-27 LAB — MAGNESIUM: Magnesium: 2.5 mg/dL — ABNORMAL HIGH (ref 1.7–2.4)

## 2021-07-27 LAB — PHOSPHORUS: Phosphorus: 4.1 mg/dL (ref 2.5–4.6)

## 2021-07-27 LAB — AMMONIA: Ammonia: 152 umol/L — ABNORMAL HIGH (ref 9–35)

## 2021-07-27 LAB — PREPARE RBC (CROSSMATCH)

## 2021-07-27 MED ORDER — ALBUMIN HUMAN 25 % IV SOLN: 25.0000 g | Freq: Four times a day (QID) | INTRAVENOUS | Status: DC

## 2021-07-27 MED ORDER — MIDODRINE HCL 5 MG PO TABS
10.0000 mg | ORAL_TABLET | Freq: Three times a day (TID) | ORAL | Status: DC
  Administered 2021-07-27 – 2021-07-29 (×4): 10 mg via ORAL
  Filled 2021-07-27 (×5): qty 2

## 2021-07-27 MED ORDER — FUROSEMIDE 10 MG/ML IJ SOLN
40.0000 mg | Freq: Three times a day (TID) | INTRAMUSCULAR | Status: DC
  Administered 2021-07-27 – 2021-07-29 (×6): 40 mg via INTRAVENOUS
  Filled 2021-07-27 (×8): qty 4

## 2021-07-27 MED ORDER — POTASSIUM CHLORIDE CRYS ER 20 MEQ PO TBCR
30.0000 meq | EXTENDED_RELEASE_TABLET | Freq: Three times a day (TID) | ORAL | Status: AC
  Administered 2021-07-27 (×2): 30 meq via ORAL
  Filled 2021-07-27 (×2): qty 1

## 2021-07-27 MED ORDER — LACTULOSE 10 GM/15ML PO SOLN: 10.0000 g | Freq: Two times a day (BID) | ORAL | Status: DC

## 2021-07-27 MED ORDER — LACTULOSE 10 GM/15ML PO SOLN: 20.0000 g | Freq: Three times a day (TID) | ORAL | Status: DC

## 2021-07-27 MED ORDER — SODIUM CHLORIDE 0.9% IV SOLUTION: Freq: Once | INTRAVENOUS | Status: AC

## 2021-07-27 MED ORDER — LACTULOSE 10 GM/15ML PO SOLN
20.0000 g | Freq: Three times a day (TID) | ORAL | Status: DC
  Administered 2021-07-27 – 2021-07-28 (×3): 20 g via ORAL
  Filled 2021-07-27 (×5): qty 30

## 2021-07-27 NOTE — Progress Notes (Signed)
[facility] Progress Note  Subjective: UOP 1.5 L yest. BP 100- 110 sbp's.   Vitals:   07/26/21 1527 07/26/21 1752 07/26/21 2104 07/27/21 0532  BP: 115/65 (!) 91/59 (!) 108/52 102/63  Pulse: 76 80 71 85  Resp: _0 Temp: 98.4 F (36.9 C) 97.8 F (36.6 C) 98.4 F (36.9 C) 98.9 F (37.2 C)  TempSrc: Oral Oral Oral Oral  SpO2: 100% 99% 100% 98%  Weight:      Height:        Exam: Gen alert, no distress, obese WF bedbound, no distress No rash, cyanosis or gangrene Sclera anicteric, throat clear  No jvd or bruits Chest clear bilat to bases, no rales/ wheezing RRR no MRG Abd soft ntnd no mass or ascites +bs GU deferred MS no joint effusions or deformity Ext diffuse 2+ bilat UE edema, bilat 2-3+ hip/ flank edema, no pretib edema Neuro is alert, Ox 3 , nonfocal, sig deconditioning       Home meds include - lexapro, florinef, norco prn, levothyroxine, ensure/ Juven/ prosource plus, remeron      Date                                      Creat               eGFR    2008- 2015                           0.82- 1.00    2021                                     1.09- 1.23    Sept 2022                             0.97- 1.01 May 2021                              0.95- 1.56    Dec 12-18, 2022                  2.44 >> 1.31    20 >>  42 ml/min    Dec 28- Jul 26, 2021          1.08- 1.87        27- 53 ml/min                               CXR 12/28 - IMPRESSION: 1. Atelectasis and/or consolidation in the left lower lobe with small left pleural effusion. 2. Trace right pleural effusion. 3. Borderline cardiomegaly.    Renal US - 9.4/9.7 cm kidneys w/o hydro   UA 12/28 - negative    UPC ratio was 0.17    UNa < 10, UCr 59 on 07/25/21    CT abd pelvis 12/29 - IMPRESSION: 1. Cardiac enlargement with small pericardial effusion. 2. Small bilateral pleural effusions with basilar atelectasis or consolidation. 3. 5 mm right solid pulmonary nodule...4. Cholelithiasis. 5.  Noncalcified structure in the gallbladder is nonspecific and could represent stone, sludge, or mass lesion. 6. Fatty infiltration of  the liver. 7. Aortic atherosclerosis. 8. Foley catheter decompresses the bladder. 9. Calcified uterine fibroids.     ECHO  - LVEF 60-65 %, no WMA, G1DD, RV function okay       Na 139  K 4  CO2 21  BUN 84  Cr 1.59  Ca 7.8  phos 4.6  Alb 1.9       NH3 - 48 - 63 - 85 - 73 today        AST/ ALT wnl, Tbili 1.4   Hb 6.3  plt 61k         Hb 6.3   WBC 6K  plt 61k       BP's mostly on lower side 90- 110/ 60s      Room air = 100%       Cortisol and ACTH stim test were wnl      TSH 8.6, normal free T4     Assessment/ Plan: AKI on CKD 3a - b/l creatinine 1.08- 1.31 from dec 2022, eGFR 42- 53 ml/min. Pt admitted w/ anasarca, hypotension, relative hypotension, hyperammonemia, fatty liver and renal failure. ECHO showed normal EF. No sig proteinuria. INR is up at 2-2.5, not on warfarin. There are some signs of liver disease [facility], INR). Low UNa is consistent with prerenal. Appreciate cardiology input, anasarca is not due to diast CHF.  Do not think kidneys are the primary issue. Suspect 3rd spacing/ hypoalbuminemia causing prerenal state, hypotension. Question of liver failure as well. Very difficult situation w/ hypotension. Would cont midodrine, will ^ to 10 tid. Has rec'd > 3 days IV albumin, this can be stopped. Diuretics have not really been tried much, will try IV lasix 40 tid... as long as BP's are reasonable (SBP > 90).  Not sure there is a good outcome for this patient. Will follow.  Hypotension - midodrine has been started,  no signs of sepsis Anasarca - 3rd spacing, malnutrition, poss liver disease. As above.  Debility - pt bedbound since ankle surgery in October Hypoalbuminemia - malnutrition,  liver disease? Depression - seen by psychiatry multiple times this admission   Sally Wise 07/27/2021, 7:27 AM   Recent Labs  Lab 07/26/21 0413 07/26/21 2110  07/27/21 0527  K 4.0  --  3.6  BUN 84*  --  83*  CREATININE 1.59*  --  1.37*  CALCIUM 7.8*  --  8.1*  PHOS 4.6  --  4.1  HGB 6.3* 7.3* 7.0*   Inpatient medications:  (feeding supplement) PROSource Plus  30 mL Oral BID BM   sodium chloride   Intravenous Once   ascorbic acid  500 mg Oral BID   Chlorhexidine Gluconate Cloth  6 each Topical Daily   cholecalciferol  5,000 Units Oral Daily   feeding supplement  237 mL Oral BID BM   folic acid  1 mg Oral Daily   lactobacillus acidophilus & bulgar  2 tablet Oral TID   levothyroxine  125 mcg Oral Q0600   liver oil-zinc oxide   Topical Q4H   midodrine  5 mg Oral TID WC   mirtazapine  15 mg Oral QHS   multivitamin with minerals  1 tablet Oral Daily   pantoprazole  40 mg Oral BID AC   protein supplement  1 Scoop Oral TID WC   sucralfate  1 g Oral TID WC & HS    acetaminophen **OR** acetaminophen, bisacodyl, HYDROcodone-acetaminophen, HYDROmorphone (DILAUDID) injection, liver oil-zinc oxide, ondansetron

## 2021-07-27 NOTE — Progress Notes (Signed)
PROGRESS NOTE    Sally Wise  [id number] DOB: 1946-02-07 DOA: 07/10/2021 PCP: Sally Cruel, MD  Chief Complaint  Patient presents with   Weakness    Brief Narrative:  76 yo with hx atrial fibrillation, hypothyroidism, chronic thrombocytopenia, HFpEF, HLD, HTN, depression and multiple other medical issues who presented with weakness and lethargy.  She's had poor PO intake.  Of note, she fractured her ankle in October 2022 and has been living at [name] since then.  She was admitted with dehydration, hypotension, and AKI on 12/12 and returned on 12/28 with similar issues.  She was seen by GI on 12/30 for upper endoscopy which showed Sally Wise nonbleeding duodenal ulcer, erythematous mucosa in the gastric body and erythematous duodenopathy.  Labs for H pylori pending and celiac labs are negative.  GI now signed off.  Heme consulted  (now signed off) due to persistently elevated INR.  Palliative was consulted as well.  She remains anasarcic with poor appetite.  At this time, trying to work towards discharge, but complicated by hypotension and AKI.  See below for additional details    Assessment & Plan:   Principal Problem:   Acute metabolic encephalopathy Active Problems:   Goals of care, counseling/discussion   Hypotension   Anasarca   Lack of appetite   Fatty liver disease, nonalcoholic   Anemia   AKI (acute kidney injury) ([place])   PUD (peptic ulcer disease)   Chronic kidney disease with active medical management without dialysis, stage 3 (moderate) (HCC)   Elevated INR   Thrombocytopenia (HCC)   Hyperkalemia   Ankle fracture   Hypoalbuminemia   Pressure ulcer   Contamination of blood culture   Hypothyroidism   Morbid obesity (HCC)   PAF (paroxysmal atrial fibrillation) (HCC)   * Acute metabolic encephalopathy Worsened today, ammonia significantly elevated -> hepatic encephalopathy (worsened as we held her lactulose with diarrhea) [place] ascites search TSH of 8.6 with  normal free T4. Vitamin B12 and folate are within normal limits.  Cortisol normal with Sally Wise.  Psychiatry evaluated and recommended transition from Sally Wise to Remeron.  MRI brain without acute process. Chronic ischemic changes noted. Discussed with neurology and findings do not explain patient presentation; dural thickening acknowledged but recommendation for no further workup per neurology unless neurologic symptoms arose.  Goals of care, counseling/discussion Difficult situation.  Her main issues right now are her lack of PO intake, anasarca, anemia, hypoalbuminemia, hypotension, and acute kidney injury - her labs suggest poor synthetic function of the liver (though imaging shows steatosis).  Her continued poor PO intake limits her potential for recovery and without nutrition, she will continue to decline.  We discussed today importance of nutrition and options regarding this (trial of NG/panda tube, PEG tube, or continuing without Sally Wise plan for tube feeding -> and with that, Sally Wise focus on comfort).  I think tube feeding reasonable option to try.  She continues to decline this.  We discussed if no desire to try tube feeding, then would consider Sally Wise transition to focus on comfort/hospice.  I'll ask palliative to help [place] discuss this further, I've added pain medicines today to help with some of the wounds that she has that are causing her significant discomfort.  Hopefully this will help with her comfort while she remains inpatient.   Appreciate palliative assistance  Fatty liver disease, nonalcoholic Imaging showed fatty liver -> her constellation of findings make me concerned/suspicious regarding her synthetic liver function and presence of cirrhosis?  This may be most likely unifying diagnosis, though this is not definitively diagnosed. High INR despite vit K, thrombocytopenia, hypoalbuminemia, mildly elevated bilirubin Likely diagnosis from recent abdominal ultrasound and more recent CT  abdomen/pelvis. Patient would benefit from outpatient hepatology. Consider additional liver imaging (MRI/elastography?) and/or liver biopsy - will hold off for now with continued poor PO intake, planning additional goc conversations as noted above  Lack of appetite Unclear cause. Dietitian consulted. Psychiatry consulted. Patient started on Remeron to help with appetite. Per patient, she has no decrease in appetite but has particular eating habits. Patient declines enteral nutrition via feeding tube. GI consulted and patient underwent upper endoscopy with erythematous duodenopathy with associated non-bleeding duodenal ulcer with clean base. -Continue Remeron -per RD note 1/12, consuming 0% of meals, drinking some fluids, accepted 1 prosource in last 4 days, 1 ensure in last 4 days, 3 beneprotein on 1/11.  She's continuing to decline tube feeding.  Anasarca- (present on admission) Chronic. Likely related to chronic hypoalbuminemia.  Foley catheter placed on 07/11/2021 and removed on 07/19/2021. Echo with normal EF, grade I diastolic dysfunction Urine without notable proteinuria (UP/C 0.17) With hypotension now as well Follow urine sodium as above Nephrology c/s, appreciate recs - diuresing as tolerated, midodrine, appreciate assistance Cardiology c/s - noted edema not cardiac, recommended enteral feeding or TPN   Hypotension- (present on admission) Improved with albumin and midodrine She remains diffusely edematous with hypoalbuminemia, though suspect she's intravascularly dry with Una <10 1 unit pRBC on 1/13 Will transfuse again 1/14 Continue midodrine Follow BP closely Follow urine sodium (low)  AKI (acute kidney injury) ([place]) Creatinine fluctuating Appreciate renals assistance in setting of hypotension, anasarca Low urine sodium Diuresis per renal Renal US without hydro  Anemia- (present on admission) Hb remains borderline Give additional unit pRBC today S/p 1 unit 1/13 No  bleeding noted, follow  PUD (peptic ulcer disease) EGD with erythematous mucosa in gastric body, erythematous duodenopathy, nonbleeding duodenal ulcer with clean base. H. Pylori and celiac serology negative (negative TTG, reticulin ab, and gliadin ab). GI recommending Protonix 40 mg BID x1 month and sucralfate suspension 1 g PO QID x2 weeks.  -Re-order h pylori stool ab (pending)  Elevated INR Unclear etiology -> liver failure, cirrhosis?. Hematology consulted and have recommended IV vitamin K. Treatment completed. Follow INR - remains elevated  Chronic kidney disease with active medical management without dialysis, stage 3 (moderate) ([place])- (present on admission) Baseline creatinine of about 1.2  Thrombocytopenia ([place])- (present on admission) Chronic and stable. Likely related to underlying liver disease.  Hyperkalemia Hemolysis, improved  Ankle fracture- (present on admission) Left ankle. History of ORIF on recent admission on 05/07/21. Max weight bearing of 75 lbs in boot for one month.  Hypoalbuminemia- (present on admission) Possibly secondary to poor nutrition vs liver disease. Stable.  Pressure ulcer Pressure Injury 06/25/21 Coccyx Mid;Lower Stage 3 -  Full thickness tissue loss. Subcutaneous fat may be visible but bone, tendon or muscle are NOT exposed. 1.5 cm by 1 cm (Active)  06/25/21 0130  Location: Coccyx  Location Orientation: Mid;Lower  Staging: Stage 3 -  Full thickness tissue loss. Subcutaneous fat may be visible but bone, tendon or muscle are NOT exposed.  Wound Description (Comments): 1.5 cm by 1 cm  Present on Admission: Yes     Pressure Injury Pretibial Distal;Left Unstageable - Full thickness tissue loss in which the base of the injury is covered by slough (yellow, tan, gray, green or brown) and/or eschar (tan, brown or  black) in the wound bed. 2 cm x 1 cm (Active)     Location: Pretibial  Location Orientation: Distal;Left  Staging: Unstageable - Full  thickness tissue loss in which the base of the injury is covered by slough (yellow, tan, gray, green or brown) and/or eschar (tan, brown or black) in the wound bed.  Wound Description (Comments): 2 cm x 1 cm  Present on Admission: Yes     Pressure Injury 07/10/21 Groin Medial Stage 1 -  Intact skin with non-blanchable redness of Alaya Iverson localized area usually over Nimah Uphoff bony prominence. medical equipment PI: lines of nonblanchable redness where purewick tubing might have been (Active)  07/10/21 1906  Location: Groin  Location Orientation: Medial  Staging: Stage 1 -  Intact skin with non-blanchable redness of Rosealynn Mateus localized area usually over Lodie Waheed bony prominence.  Wound Description (Comments): medical equipment PI: lines of nonblanchable redness where purewick tubing might have been  Present on Admission: Yes   Appreciate wound care recs - I think some of this may have been related to initial foley placement given appearance, will continue to monitor closely     Contamination of blood culture 1 of 2 from 12/28 with staph epidermidis. Likely contaminant.  Morbid obesity ([place])- (present on admission) Body mass index is 44.25 kg/m.  Dietitian recommendations: 48 hour Calorie Count (07/19/2021) Ensure Enlive po BID, each supplement provides 350 kcal and 20 grams of protein Prosource Plus PO BID, each provides 100 kcals and 15g protein Magic cup TID with meals, each supplement provides 290 kcal and 9 grams of protein  Beneprotein powder TID, each provides 25 kcals and 6g protein. Encouraged PO intakes and supplements  Hypothyroidism- (present on admission) Synthroid recently increased from 112 mcg to 125 mcg (on 12/19 dc summary noted). TSH slightly improved this admission. -Continue Synthroid -Repeat TSH in 2-3 weeks; increase Synthroid if TSH still elevated   DVT prophylaxis: SCD Code Status: full Family Communication: husband at bedside Disposition:   Status is: Inpatient  Remains inpatient  appropriate because: plan for d/c       Consultants:  GI Palliative care Oncology Psychiatry Cardiology nephrology  Procedures:  UPPER GI ENDOSCOPY (07/12/2021) Impression:               - Normal esophagus.                           - Erythematous mucosa in the gastric body.                           - Erythematous duodenopathy.                           - Non-bleeding duodenal ulcer with Anushree Dorsi clean ulcer                            base ([place] Class III).                           - No specimens collected.   Recommendation:           - Resume regular diet.                           - Use Protonix (pantoprazole) 40 mg PO BID for  1                            month.                           - Use sucralfate suspension 1 gram PO QID for 2                            weeks.                           - Stool for H pylori Antigen and celiac serology.  Antimicrobials:  Anti-infectives (From admission, onward)    Start     Dose/Rate Route Frequency Ordered Stop   07/11/21 0800  cefTRIAXone (ROCEPHIN) 2 g in sodium chloride 0.9 % 100 mL IVPB  Status:  Discontinued        2 g 200 mL/hr over 30 Minutes Intravenous Every 24 hours 07/10/21 1807 07/11/21 0906   07/10/21 0700  cefTRIAXone (ROCEPHIN) 2 g in sodium chloride 0.9 % 100 mL IVPB        2 g 200 mL/hr over 30 Minutes Intravenous  Once 07/10/21 0654 07/10/21 0816       Subjective: More lethargic today  Objective: Vitals:   07/26/21 2104 07/27/21 0532 07/27/21 1211 07/27/21 1246  BP: (!) 108/52 102/63 (!) 97/58 (!) 97/56  Pulse: 71 85 85 80  Resp: 16 18 18 18   Temp: 98.4 F (36.9 C) 98.9 F (37.2 C) 98.6 F (37 C) 98.6 F (37 C)  TempSrc: Oral Oral Oral Oral  SpO2: 100% 98% 98% 98%  Weight:      Height:        Intake/Output Summary (Last 24 hours) at 07/27/2021 1424 Last data filed at 07/27/2021 1000 Gross per 24 hour  Intake 1132.23 ml  Output 1300 ml  Net -167.77 ml   Filed Weights   07/16/21 0500  07/23/21 1058 07/26/21 0521  Weight: 132 kg (!) 168.7 kg (!) 165.6 kg    Examination:  General: No acute distress. Cardiovascular: RRR Lungs: unlabored Abdomen: Soft, nontender, nondistended  Neurological: lethargic. Moves all extremities 4. Cranial nerves II through XII grossly intact. Skin: Warm and dry. No rashes or lesions. Extremities: anasarca      Data Reviewed: I have personally reviewed following labs and imaging studies  CBC: Recent Labs  Lab 07/22/21 1106 07/25/21 0423 07/26/21 0413 07/26/21 2110 07/27/21 0527  WBC 12.1* 8.9 6.8  --  6.9  NEUTROABS  --  3.5 2.8  --  2.5  HGB 7.8* 7.3* 6.3* 7.3* 7.0*  HCT 25.3* 24.1* 20.4* 23.4* 22.9*  MCV 98.8 104.8* 100.0  --  100.0  PLT 86* 82* 61*  --  57*    Basic Metabolic Panel: Recent Labs  Lab 07/24/21 0526 07/24/21 1918 07/25/21 0423 07/26/21 0413 07/27/21 0527  NA 136 137 138 139 143  K 5.9* 4.7 4.3 4.0 3.6  CL 110 110 110 109 114*  CO2 18* 19* 19* 21* 22  GLUCOSE 82 115* 82 77 85  BUN 90* 93* 90* 84* 83*  CREATININE 1.77* 1.87* 1.67* 1.59* 1.37*  CALCIUM 7.4* 7.5* 7.6* 7.8* 8.1*  MG  --   --  2.4 2.6* 2.5*  PHOS  --   --  4.7* 4.6 4.1    GFR: Estimated Creatinine  Clearance: 56.8 mL/min (Adellyn Capek) (by C-G formula based on SCr of 1.37 mg/dL (H)).  Liver Function Tests: Recent Labs  Lab 07/25/21 0423 07/26/21 0413 07/27/21 0527  AST 19 19 19   ALT 22 21 22   ALKPHOS 318* 279* 259*  BILITOT 1.4* 1.4* 1.7*  PROT 4.9* 4.5* 4.9*  ALBUMIN 1.9* 2.1* 2.8*    CBG: No results for input(s): GLUCAP in the last 168 hours.   No results found for this or any previous visit (from the past 240 hour(s)).       Radiology Studies: [place] EKG SITE RITE  Result Date: 07/26/2021 If Site Rite image not attached, placement could not be confirmed due to current cardiac rhythm.       Scheduled Meds:  (feeding supplement) PROSource Plus  30 mL Oral BID BM   sodium chloride   Intravenous Once   ascorbic acid   500 mg Oral BID   Chlorhexidine Gluconate Cloth  6 each Topical Daily   cholecalciferol  5,000 Units Oral Daily   feeding supplement  237 mL Oral BID BM   folic acid  1 mg Oral Daily   furosemide  40 mg Intravenous Q8H   lactobacillus acidophilus & bulgar  2 tablet Oral TID   lactulose  20 g Oral TID   levothyroxine  125 mcg Oral Q0600   liver oil-zinc oxide   Topical Q4H   midodrine  10 mg Oral TID WC   mirtazapine  15 mg Oral QHS   multivitamin with minerals  1 tablet Oral Daily   pantoprazole  40 mg Oral BID AC   potassium chloride  30 mEq Oral TID   protein supplement  1 Scoop Oral TID WC   sucralfate  1 g Oral TID WC & HS   Continuous Infusions:     LOS: 16 days    Time spent: over 30 min    Fayrene Helper, MD [facility]   To contact the attending provider between 7A-7P or the covering provider during after hours 7P-7A, please log into the web site [email or web address] and access using universal [place] password for that web site. If you do not have the password, please call the hospital operator.  07/27/2021, 2:24 PM

## 2021-07-27 NOTE — Progress Notes (Signed)
Subjective: Poor appetite; doesn't want to eat. No nausea/vomiting. No reported or documented diarrhea.  Objective: Vital signs in last 24 hours: Temp:  [97.8 F (36.6 C)-98.9 F (37.2 C)] 98.6 F (37 C) (01/14 1246) Pulse Rate:  [71-85] 80 (01/14 1246) Resp:  [16-18] 18 (01/14 1246) BP: (91-108)/(52-63) 97/56 (01/14 1246) SpO2:  [98 %-100 %] 98 % (01/14 1246) Weight change:  Last BM Date: 07/26/21  PE: GEN:  Obese, flat affect, minimized eye contact, NAD ABD:  Protuberant, obese HEENT:  Anicteric  Lab Results: CBC    Component Value Date/Time   WBC 6.9 07/27/2021 0527   RBC 2.29 (L) 07/27/2021 0527   HGB 7.0 (L) 07/27/2021 0527   HGB 10.6 (L) 12/07/2020 1547   HGB 12.5 08/29/2014 1049   HCT 22.9 (L) 07/27/2021 0527   HCT 40.2 08/29/2014 1049   PLT 57 (L) 07/27/2021 0527   PLT 107 (L) 12/07/2020 1547   PLT 123 Large platelets present (L) 08/29/2014 1049   MCV 100.0 07/27/2021 0527   MCV 87.3 08/29/2014 1049   MCH 30.6 07/27/2021 0527   MCHC 30.6 07/27/2021 0527   RDW 25.9 (H) 07/27/2021 0527   RDW 15.3 (H) 08/29/2014 1049   LYMPHSABS 3.0 07/27/2021 0527   LYMPHSABS 1.6 08/29/2014 1049   MONOABS 1.0 07/27/2021 0527   MONOABS 0.9 08/29/2014 1049   EOSABS 0.3 07/27/2021 0527   EOSABS 0.0 08/29/2014 1049   BASOSABS 0.0 07/27/2021 0527   BASOSABS 0.0 08/29/2014 1049  CMP     Component Value Date/Time   NA 143 07/27/2021 0527   K 3.6 07/27/2021 0527   CL 114 (H) 07/27/2021 0527   CO2 22 07/27/2021 0527   GLUCOSE 85 07/27/2021 0527   BUN 83 (H) 07/27/2021 0527   CREATININE 1.37 (H) 07/27/2021 0527   CREATININE 1.00 10/28/2012 1611   CALCIUM 8.1 (L) 07/27/2021 0527   PROT 4.9 (L) 07/27/2021 0527   ALBUMIN 2.8 (L) 07/27/2021 0527   AST 19 07/27/2021 0527   ALT 22 07/27/2021 0527   ALKPHOS 259 (H) 07/27/2021 0527   BILITOT 1.7 (H) 07/27/2021 0527   GFRNONAA 40 (L) 07/27/2021 0527   GFRNONAA 58 (L) 10/28/2012 1611   GFRAA 80 (L) 08/05/2013 1000   GFRAA 67  10/28/2012 1611   Assessment:   Failure to thrive.  Hypoalbuminemia.  Patient does not have diarrhea, thus I can't imagine she has protein-losing enteropathy or significant intestinal malabsorption; her celiac studies recently negative as was H. pylori.  Given her low platelets and elevated INR, she certainly could have cirrhosis, but I've never seen it cause such profound poor appetite in absence of acute liver failure (which patient does not have).  One needs to consider neurologic/psychiatric considerations of poor appetite, which in turn could lead to hypoalbuminemia. Altered mental status.  I favor more primary neurologic/psychiatric process.  Symptoms and clinical appearance not consistent with hepatic encephalopathy. Elevated LFTs with steatosis.  Cirrhosis certainly possible, but prior CT did not show splenomegaly or any findings suggestive of portal hypertension.  Scant ascites on recent renal U/S, repeat abd u/s in process.  Plan:   This is a very difficult situation.  Patient's primary need, regardless of underlying diagnosis, is to get increased nutrition.  I emphasized this point to patient and husband.  In absence of adequate oral nutrition intake, patient would either need nasoenteric vs percutaneous feeding tube or hospice evaluation. Lactulose ok for her altered mental status to see if it helps, if causes too much  diarrhea (our goal with this medication is to induce 3-4 loose stools per day), one could try rifaximin 550 mg po bid. Repeat U/S in process. Not sure there is going to be a clear diagnosis in this case, but, as above, most important thing at this point is to improve nutritional status.  Liver biopsy and other evaluation to better assess for cirrhosis won't make a difference in patient's clinical course at this current time. [facility] will revisit Monday 07/29/21.   Landry Dyke 07/27/2021, 3:48 PM   Cell [phone] If no answer or after 5 PM call [phone]

## 2021-07-28 LAB — H. PYLORI ANTIGEN, STOOL: H. Pylori Stool Ag, Eia: NEGATIVE

## 2021-07-28 LAB — CBC WITH DIFFERENTIAL/PLATELET
Abs Immature Granulocytes: 0.07 10*3/uL (ref 0.00–0.07)
Basophils Absolute: 0 10*3/uL (ref 0.0–0.1)
Basophils Relative: 0 %
Eosinophils Absolute: 0.2 10*3/uL (ref 0.0–0.5)
Eosinophils Relative: 2 %
HCT: 29.2 % — ABNORMAL LOW (ref 36.0–46.0)
Hemoglobin: 9.4 g/dL — ABNORMAL LOW (ref 12.0–15.0)
Immature Granulocytes: 1 %
Lymphocytes Relative: 43 %
Lymphs Abs: 3.9 10*3/uL (ref 0.7–4.0)
MCH: 30.8 pg (ref 26.0–34.0)
MCHC: 32.2 g/dL (ref 30.0–36.0)
MCV: 95.7 fL (ref 80.0–100.0)
Monocytes Absolute: 1.3 10*3/uL — ABNORMAL HIGH (ref 0.1–1.0)
Monocytes Relative: 15 %
Neutro Abs: 3.6 10*3/uL (ref 1.7–7.7)
Neutrophils Relative %: 39 %
Platelets: 59 10*3/uL — ABNORMAL LOW (ref 150–400)
RBC: 3.05 MIL/uL — ABNORMAL LOW (ref 3.87–5.11)
RDW: 26.3 % — ABNORMAL HIGH (ref 11.5–15.5)
WBC: 9.2 10*3/uL (ref 4.0–10.5)
nRBC: 0.5 % — ABNORMAL HIGH (ref 0.0–0.2)

## 2021-07-28 LAB — COMPREHENSIVE METABOLIC PANEL
ALT: 24 U/L (ref 0–44)
AST: 23 U/L (ref 15–41)
Albumin: 2.9 g/dL — ABNORMAL LOW (ref 3.5–5.0)
Alkaline Phosphatase: 283 U/L — ABNORMAL HIGH (ref 38–126)
Anion gap: 8 (ref 5–15)
BUN: 79 mg/dL — ABNORMAL HIGH (ref 8–23)
CO2: 21 mmol/L — ABNORMAL LOW (ref 22–32)
Calcium: 8.3 mg/dL — ABNORMAL LOW (ref 8.9–10.3)
Chloride: 116 mmol/L — ABNORMAL HIGH (ref 98–111)
Creatinine, Ser: 1.47 mg/dL — ABNORMAL HIGH (ref 0.44–1.00)
GFR, Estimated: 37 mL/min — ABNORMAL LOW (ref >60)
Glucose, Bld: 108 mg/dL — ABNORMAL HIGH (ref 70–99)
Potassium: 4.2 mmol/L (ref 3.5–5.1)
Sodium: 145 mmol/L (ref 135–145)
Total Bilirubin: 1.7 mg/dL — ABNORMAL HIGH (ref 0.3–1.2)
Total Protein: 5.3 g/dL — ABNORMAL LOW (ref 6.5–8.1)

## 2021-07-28 LAB — MAGNESIUM: Magnesium: 2.5 mg/dL — ABNORMAL HIGH (ref 1.7–2.4)

## 2021-07-28 LAB — PHOSPHORUS: Phosphorus: 3.2 mg/dL (ref 2.5–4.6)

## 2021-07-28 LAB — AMMONIA: Ammonia: 136 umol/L — ABNORMAL HIGH (ref 9–35)

## 2021-07-28 MED ORDER — ONDANSETRON HCL 4 MG/2ML IJ SOLN: 4.0000 mg | Freq: Four times a day (QID) | INTRAMUSCULAR | Status: DC | PRN

## 2021-07-28 MED ORDER — PROMETHAZINE HCL 25 MG RE SUPP
25.0000 mg | Freq: Four times a day (QID) | RECTAL | Status: DC | PRN
  Filled 2021-07-28: qty 1

## 2021-07-28 MED ORDER — RIFAXIMIN 550 MG PO TABS
550.0000 mg | ORAL_TABLET | Freq: Two times a day (BID) | ORAL | Status: DC
  Administered 2021-07-28: 550 mg via ORAL
  Filled 2021-07-28 (×3): qty 1

## 2021-07-28 MED ORDER — THIAMINE HCL 100 MG PO TABS: 100.0000 mg | ORAL_TABLET | Freq: Every day | ORAL | Status: DC

## 2021-07-28 MED ORDER — PROMETHAZINE HCL 25 MG PO TABS: 25.0000 mg | ORAL_TABLET | Freq: Four times a day (QID) | ORAL | Status: DC | PRN

## 2021-07-28 MED ORDER — THIAMINE HCL 100 MG/ML IJ SOLN
500.0000 mg | Freq: Three times a day (TID) | INTRAVENOUS | Status: DC
  Administered 2021-07-28 – 2021-07-29 (×3): 500 mg via INTRAVENOUS
  Filled 2021-07-28 (×4): qty 5

## 2021-07-28 NOTE — Progress Notes (Signed)
Pt remains too lethargic to take pm meds, MD made aware of.

## 2021-07-28 NOTE — Progress Notes (Signed)
[facility] Progress Note  Subjective: UOP 2.4 L yest w/ IV lasix, creat up slightly 1.4, BUN stable, net neg 740 cc yesterday. BP's stable in the 90's mostly. Pt very tired.   Vitals:   07/27/21 1545 07/27/21 2136 07/28/21 0549 07/28/21 1355  BP: (!) 102/56 125/74 94/70 (!) 94/38  Pulse: 92 91 98 99  Resp: 16 18 18 18   Temp: 98.8 F (37.1 C) 98.9 F (37.2 C) 98.7 F (37.1 C) 98.8 F (37.1 C)  TempSrc: Oral Oral Oral Oral  SpO2: 97% 100% 97% 98%  Weight:      Height:        Exam: Gen obese WF bedbound, lethargic today No rash, cyanosis or gangrene Sclera anicteric, throat clear  No jvd or bruits Chest clear bilat to bases, no rales/ wheezing RRR no MRG Abd soft ntnd no mass or ascites +bs GU deferred MS no joint effusions or deformity Ext diffuse 1-2+ bilat UE edema, bilat 2-3+ hip/ flank edema, no pretib edema Neuro is alert, Ox 3 , nonfocal, sig deconditioning       Home meds include - lexapro, florinef, norco prn, levothyroxine, ensure/ Juven/ prosource plus, remeron      Date                                      Creat               eGFR    2008- 2015                           0.82- 1.00    2021                                     1.09- 1.23    Sept 2022                             0.97- 1.01 May 2021                              0.95- 1.56    Dec 12-18, 2022                  2.44 >> 1.31    20 >>  42 ml/min    Dec 28- Jul 26, 2021          1.08- 1.87        27- 53 ml/min                               CXR 12/28 - IMPRESSION: 1. Atelectasis and/or consolidation in the left lower lobe with small left pleural effusion. 2. Trace right pleural effusion. 3. Borderline cardiomegaly.    Renal US - 9.4/9.7 cm kidneys w/o hydro   UA 12/28 - negative    UPC ratio was 0.17    UNa < 10, UCr 59 on 07/25/21    CT abd pelvis 12/29 - IMPRESSION: 1. Cardiac enlargement with small pericardial effusion. 2. Small bilateral pleural effusions with basilar atelectasis  or consolidation. 3. 5 mm right solid pulmonary nodule...4. Cholelithiasis. 5. Noncalcified structure in  the gallbladder is nonspecific and could represent stone, sludge, or mass lesion. 6. Fatty infiltration of the liver. 7. Aortic atherosclerosis. 8. Foley catheter decompresses the bladder. 9. Calcified uterine fibroids.     ECHO  - LVEF 60-65 %, no WMA, G1DD, RV function okay       Alb 1.9  NH3 73         AST/ ALT wnl, Tbili 1.4   Hb 6.3          Hb 6.3   WBC 6K  plt 61k       BP's mostly on lower side 90- 110/ 60s      Room air = 100%       Cortisol and ACTH stim test were wnl      TSH 8.6, normal free T4     Assessment/ Plan: AKI on CKD 3a - b/l creatinine 1.08- 1.31 from dec 2022, eGFR 42- 53 ml/min. Pt admitted w/ anasarca, hypotension, relative hypotension, hyperammonemia, fatty liver and renal failure. ECHO showed normal EF. No proteinuria. INR is up at 2-2.5, not on warfarin. Some +signs of liver disease [facility], INR, low albumin). Low UNa consistent with prerenal. Appreciate cardiology input, anasarca not due to diast CHF. UA is negative,renal US w/o obstruction, do not think kidneys are the primary issue. Suspect 3rd spacing/ hypoalbuminemia causing prerenal state, hypotension. Question of liver failure as well. BP's are soft > we increased midodrine to 10 tid. She is sp 3-4 day course of IV albumin. IV lasix was started 1/14 at 50m tid as she had not received a decent trial of diuresis. Can continue as long as BP's are reasonable and renal function and the patient are not declining.  Prognosis is quite poor. Will follow.  Hypotension - midodrine has been started,  no signs of sepsis Anasarca - 3rd spacing, malnutrition, poss liver disease. As above.  Debility - pt bedbound since ankle surgery in October Hypoalbuminemia - malnutrition,  liver disease? Depression - seen by psychiatry multiple times this admission   RKelly Splinter1/15/2023, 6:48 PM   Recent Labs  Lab 07/27/21 0527  07/28/21 0431  K 3.6 4.2  BUN 83* 79*  CREATININE 1.37* 1.47*  CALCIUM 8.1* 8.3*  PHOS 4.1 3.2  HGB 7.0* 9.4*    Inpatient medications:  (feeding supplement) PROSource Plus  30 mL Oral BID BM   ascorbic acid  500 mg Oral BID   Chlorhexidine Gluconate Cloth  6 each Topical Daily   cholecalciferol  5,000 Units Oral Daily   feeding supplement  237 mL Oral BID BM   folic acid  1 mg Oral Daily   furosemide  40 mg Intravenous Q8H   lactobacillus acidophilus & bulgar  2 tablet Oral TID   lactulose  20 g Oral TID   levothyroxine  125 mcg Oral Q0600   liver oil-zinc oxide   Topical Q4H   midodrine  10 mg Oral TID WC   mirtazapine  15 mg Oral QHS   multivitamin with minerals  1 tablet Oral Daily   pantoprazole  40 mg Oral BID AC   protein supplement  1 Scoop Oral TID WC   rifaximin  550 mg Oral BID   sucralfate  1 g Oral TID WC & HS   [START ON 07/31/2021] thiamine  100 mg Oral Daily    thiamine injection 500 mg (07/28/21 1626)   acetaminophen **OR** acetaminophen, bisacodyl, HYDROcodone-acetaminophen, HYDROmorphone (DILAUDID) injection, liver oil-zinc oxide, ondansetron (ZOFRAN) IV, promethazine **OR** promethazine

## 2021-07-28 NOTE — Progress Notes (Signed)
Daily Progress Note   Patient Name: Sally Wise       Date: 07/28/2021 DOB: [birth year]  Age: 76 y.o. MRN#: [id number] Attending Physician: Elodia Florence., * Primary Care Physician: [name], MD Admit Date: 07/10/2021  Reason for Consultation/Follow-up: Establishing goals of care  Subjective: Discussed case with Dr. Florene Glen.  I met today with [name] and her husband, Sally Wise.  We discussed clinical course as well as wishes moving forward in regard to advanced directives.  Discussed her hospital course to this point in time as well as concern about continued failure to thrive with hypoalbuminemia secondary to poor nutrition.  We discussed that this has in turn led to multiple other comorbidities and it seems that this nutrition component is major driving factor in many of her other problems.  We discussed difference between a aggressive medical intervention path and a palliative, comfort focused care path with specific consideration for artificial nutrition and hydration.    Ms. Regan and her husband both engage in conversation, but she seems surprised to hear that this is something that is serious enough that it could lead to her death.  While she is awake and alert, there is some concern about her waxing and waning mental status and I am not sure how much she is going to retain of conversation.  We laid out things to think about including consideration for artificial nutrition.  She seemed overwhelmed with discussion and therefore we decided to wait until tomorrow to continue conversation further.  Questions and concerns addressed.   PMT will continue to support holistically.  Length of Stay: 17  Current Medications: Scheduled Meds:   (feeding supplement) PROSource  Plus  30 mL Oral BID BM   sodium chloride   Intravenous Once   ascorbic acid  500 mg Oral BID   Chlorhexidine Gluconate Cloth  6 each Topical Daily   cholecalciferol  5,000 Units Oral Daily   feeding supplement  237 mL Oral BID BM   folic acid  1 mg Oral Daily   furosemide  40 mg Intravenous Q8H   lactobacillus acidophilus & bulgar  2 tablet Oral TID   lactulose  20 g Oral TID   levothyroxine  125 mcg Oral Q0600   liver oil-zinc oxide   Topical Q4H   midodrine  10 mg Oral TID WC   mirtazapine  15 mg Oral QHS   multivitamin with minerals  1 tablet Oral Daily   pantoprazole  40 mg Oral BID AC   protein supplement  1 Scoop Oral TID WC   sucralfate  1 g Oral TID WC & HS    Continuous Infusions:   PRN Meds: acetaminophen **OR** acetaminophen, bisacodyl, HYDROcodone-acetaminophen, HYDROmorphone (DILAUDID) injection, liver oil-zinc oxide, ondansetron  Physical Exam     General: Alert, awake, in no acute distress.  ?  Mild confusion HEENT: No bruits, no goiter, no JVD Heart: Regular rate and rhythm. No murmur appreciated. Lungs: Good air movement, clear Skin: Warm and dry Neuro: Grossly intact, nonfocal.      Psych: Flat affect Vital Signs: BP 94/70 (BP Location: Right Arm)    Pulse 98    Temp 98.7 F (37.1 C) (Oral)    Resp 18    Ht _0  (1.727 m)    Wt (!) 165.6 kg    LMP 07/14/2001    SpO2 97%    BMI 55.50 kg/m  SpO2: SpO2: 97 % O2 Device: O2 Device: Room Air O2 Flow Rate:    Intake/output summary:  Intake/Output Summary (Last 24 hours) at 07/28/2021 0943 Last data filed at 07/28/2021 0600 Gross per 24 hour  Intake 1630 ml  Output 2700 ml  Net -1070 ml   LBM: Last BM Date: 07/28/21 Baseline Weight: Weight: (!) 144.2 kg Most recent weight: Weight: (!) 165.6 kg       Palliative Assessment/Data:      Patient Active Problem List   Diagnosis Date Noted   Goals of care, counseling/discussion 07/26/2021   Hyperkalemia 07/24/2021   AKI (acute kidney injury) ([place])  02/77/4128   Acute metabolic encephalopathy 78/67/6720   Lack of appetite 07/14/2021   PUD (peptic ulcer disease) 07/14/2021   Elevated INR 07/14/2021   Fatty liver disease, nonalcoholic 94/70/9628   Contamination of blood culture 07/14/2021   Anasarca 07/11/2021   History of COVID-19 06/29/2021   Foley catheter in place 06/29/2021   Failure to thrive in adult 06/27/2021   Hypoalbuminemia 06/27/2021   Depression 06/27/2021   CAP (community acquired pneumonia) 06/27/2021   Pleural effusion 06/27/2021   Daytime somnolence 06/27/2021   Pressure ulcer 06/25/2021   Acute renal failure superimposed on stage 3a chronic kidney disease ([place]) 06/24/2021   Hypotension 06/24/2021   Ankle fracture 04/28/2021   Chronic diastolic CHF (congestive heart failure) ([place]) 04/28/2021   Anemia 04/28/2021   Prolonged QT interval 04/28/2021   Mucoid diarrhea 03/16/2021   Diarrhea 03/14/2021   Acute on chronic diastolic HF (heart failure) ([place]) 08/24/2020   Obesity, Class III, BMI 40-49.9 (morbid obesity) ([place]) 08/24/2020   Chronic kidney disease with active medical management without dialysis, stage 3 (moderate) ([place]) 08/24/2020   Combined rheumatic disorders of mitral, 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   Congestive rheumatic heart failure ([place]) 08/24/2020   Hardening of the aorta (main artery of the heart) ([place]) 08/24/2020   Kidney stone 08/24/2020   Pure hypercholesterolemia 08/24/2020   Scoliosis of thoracic spine 08/24/2020   Solitary pulmonary nodule 08/24/2020   Other long term (current) drug therapy 08/24/2020   Abnormal findings on diagnostic imaging of other specified body structures 08/24/2020   Acute respiratory failure with hypoxia ([place]) 06/04/2020   Multifocal pneumonia 06/03/2020   Low back pain 12/12/2019   History of total knee replacement, right 36/62/9476   Lichen sclerosus et atrophicus 01/04/2015   LVH (left  ventricular hypertrophy)    PAF (paroxysmal atrial fibrillation)  ([place]) 07/06/2014   Thrombocytopenia ([place]) 07/06/2014   Hyperglycemia 07/06/2014   Morbid obesity ([place]) 01/25/2013   Hypothyroidism 01/15/2013   History of total knee replacement, left 08/09/2007    Palliative Care Assessment & Plan  Recommendations/Plan: Full code/full scope Dr. Florene Glen asked GI to weigh in again.  Patient and her husband would like to know input from GI prior to further conversation. Realistically, we are looking at a situation where we will need to consider artificial nutrition and hydration versus focusing more on comfort.  She seemed surprised by the severity of her situation.  Plan for follow-up again tomorrow at 4 PM.  Goals of Care and Additional Recommendations: Limitations on Scope of Treatment: Full Scope Treatment  Code Status:    Code Status Orders  (From admission, onward)           Start     Ordered   07/10/21 1808  Full code  Continuous        07/10/21 1807           Code Status History     Date Active Date Inactive Code Status Order ID Comments User Context   06/24/2021 2238 07/02/2021 0237 Full Code [id number]  Lenore Cordia, MD ED   04/28/2021 2139 05/13/2021 2010 Full Code [id number]  Toy Baker, MD Inpatient   03/14/2021 1753 03/17/2021 1704 Full Code [id number]  Antonieta Pert, MD ED   06/03/2020 1720 06/08/2020 1927 Full Code [id number]  Jonnie Finner, DO Inpatient       Prognosis:  Unable to determine  Discharge Planning: To Be Determined  Care plan was discussed with patient and her husband  Thank you for allowing the Palliative Medicine Team to assist in the care of this patient.   Total Time 50 minutes Prolonged Time Billed No   Micheline Rough, MD  Please contact Palliative Medicine Team phone at [phone] for questions and concerns.

## 2021-07-28 NOTE — Progress Notes (Signed)
MD notified of pt's continued nausea/vomiting, unable to give most am meds.

## 2021-07-28 NOTE — Progress Notes (Signed)
Pt refused supper tray, stated that she was not hungry. Husband insisted that pt try a few bites, after 2 bites of the spaghetti pt vomited a sm amt of undigested food. After husband left, pt whispered to this nurse, "pls tell my husband to stop . Marland Kitchen I don't want this." Allowed pt to voice concerns and support offered.

## 2021-07-28 NOTE — Progress Notes (Signed)
PROGRESS NOTE    Sally Wise  [id number] DOB: [birth year] DOA: 07/10/2021 PCP: Sally Cruel, MD  Chief Complaint  Patient presents with   Weakness    Brief Narrative:  76 yo with hx atrial fibrillation, hypothyroidism, chronic thrombocytopenia, HFpEF, HLD, HTN, depression and multiple other medical issues who presented with weakness and lethargy.  She's had poor PO intake.  Of note, she fractured her ankle in October 2022 and has been living at [facility] since then.  She was admitted with dehydration, hypotension, and AKI on 12/12 and returned on 12/28 with similar issues.  She was seen by GI on 12/30 for upper endoscopy which showed Sally Wise nonbleeding duodenal ulcer, erythematous mucosa in the gastric body and erythematous duodenopathy.  Labs for H pylori pending and celiac labs are negative.  GI now signed off.  Heme consulted  (now signed off) due to persistently elevated INR.  Palliative was consulted as well.  She remains anasarcic with poor appetite.  At this time, trying to work towards discharge, but complicated by hypotension and AKI.  See below for additional details    Assessment & Plan:   Principal Problem:   Acute metabolic encephalopathy Active Problems:   Goals of care, counseling/discussion   Hypotension   Anasarca   Lack of appetite   Fatty liver disease, nonalcoholic   Anemia   AKI (acute kidney injury) ([place])   PUD (peptic ulcer disease)   Chronic kidney disease with active medical management without dialysis, stage 3 (moderate) ([facility])   Elevated INR   Thrombocytopenia ([facility])   Hyperkalemia   Ankle fracture   Hypoalbuminemia   Pressure ulcer   Contamination of blood culture   Hypothyroidism   Morbid obesity ([facility])   PAF (paroxysmal atrial fibrillation) ([facility])   * Acute metabolic encephalopathy Worsened today, ammonia significantly elevated -> hepatic encephalopathy (worsened as we held her lactulose with diarrhea) [place] ascites search -> small to moderate  volume ascites (prior to any further investigation of this, will await further goc discussions with palliative) TSH of 8.6 with normal free T4. Vitamin B12 and folate are within normal limits.  High dose thiamine Cortisol normal with Sally Wise normal ACTH stim test.  Psychiatry evaluated and recommended transition from Lexapro to Remeron.  MRI brain without acute process. Chronic ischemic changes noted. Discussed with neurology and findings do not explain patient presentation; dural thickening acknowledged but recommendation for no further workup per neurology unless neurologic symptoms arose.  Goals of care, counseling/discussion Difficult situation.  Her main issues right now are her lack of PO intake, anasarca, anemia, hypoalbuminemia, hypotension, and acute kidney injury - her labs suggest poor synthetic function of the liver (though imaging shows steatosis).  Her continued poor PO intake limits her potential for recovery and without nutrition, she will continue to decline.  We've discussed the importance of nutrition and options regarding this (trial of NG/panda tube, PEG tube, or continuing without Sally Wise plan for tube feeding -> and with that, Sally Wise focus on comfort).  I think tube feeding reasonable option to try.  She continues to decline this.  We discussed if no desire to try tube feeding, then would consider Sally Wise transition to focus on comfort/hospice.  I'll ask palliative to help [place] discuss this further, I've added pain medicines today to help with some of the wounds that she has that are causing her significant discomfort.  Hopefully this will help with her comfort while she remains inpatient.   Appreciate palliative assistance  Fatty liver  disease, nonalcoholic Imaging showed fatty liver -> her constellation of findings make me concerned/suspicious regarding her synthetic liver function and presence of cirrhosis?  This may be most likely unifying diagnosis, though this is not definitively diagnosed. High  INR despite vit K, thrombocytopenia, hypoalbuminemia, mildly elevated bilirubin Likely diagnosis from recent abdominal ultrasound and more recent CT abdomen/pelvis. Patient would benefit from outpatient hepatology. Consider additional liver imaging (MRI/elastography?) and/or liver biopsy - will hold off for now with continued poor PO intake, planning additional goc conversations as noted above Appreciate GI recommendations   Lack of appetite Unclear cause. Dietitian consulted. Psychiatry consulted. Patient started on Remeron to help with appetite. Per patient, she has no decrease in appetite but has particular eating habits. Patient declines enteral nutrition via feeding tube. GI consulted and patient underwent upper endoscopy with erythematous duodenopathy with associated non-bleeding duodenal ulcer with clean base. -Continue Remeron - She's continuing to decline tube feeding.  Anasarca- (present on admission) Chronic. Likely related to chronic hypoalbuminemia.  Foley catheter placed on 07/11/2021 and removed on 07/19/2021. Echo with normal EF, grade I diastolic dysfunction Urine without notable proteinuria (UP/C 0.17) With hypotension now as well Follow urine sodium as above Nephrology c/s, appreciate recs - diuresing as tolerated, midodrine, appreciate assistance Cardiology c/s - noted edema not cardiac, recommended enteral feeding or TPN   Hypotension- (present on admission) Improved with albumin and midodrine She remains diffusely edematous with hypoalbuminemia, though suspect she's intravascularly dry with Una <10 1 unit pRBC on 1/13 Will transfuse again 1/14 Continue midodrine Follow BP closely Follow urine sodium (low)  AKI (acute kidney injury) ([place]) Creatinine fluctuating Appreciate renals assistance in setting of hypotension, anasarca Low urine sodium Diuresis per renal Renal US without hydro  Anemia- (present on admission) Hb improved S/p 2 units 1/13 No bleeding  noted, follow  PUD (peptic ulcer disease) EGD with erythematous mucosa in gastric body, erythematous duodenopathy, nonbleeding duodenal ulcer with clean base. H. Pylori and celiac serology negative (negative TTG, reticulin ab, and gliadin ab). GI recommending Protonix 40 mg BID x1 month and sucralfate suspension 1 g PO QID x2 weeks.  -Re-order h pylori stool ab (negative)  Elevated INR Unclear etiology -> liver failure, cirrhosis?. Hematology consulted and have recommended IV vitamin K. Treatment completed. Follow INR - remains elevated  Chronic kidney disease with active medical management without dialysis, stage 3 (moderate) ([place])- (present on admission) Baseline creatinine of about 1.2  Thrombocytopenia ([place])- (present on admission) Chronic and stable. Likely related to underlying liver disease.  Hyperkalemia Hemolysis, improved  Ankle fracture- (present on admission) Left ankle. History of ORIF on recent admission on 05/07/21. Max weight bearing of 75 lbs in boot for one month.  Hypoalbuminemia- (present on admission) Possibly secondary to poor nutrition vs liver disease. Stable.  Pressure ulcer Pressure Injury 06/25/21 Coccyx Mid;Lower Stage 3 -  Full thickness tissue loss. Subcutaneous fat may be visible but bone, tendon or muscle are NOT exposed. 1.5 cm by 1 cm (Active)  06/25/21 0130  Location: Coccyx  Location Orientation: Mid;Lower  Staging: Stage 3 -  Full thickness tissue loss. Subcutaneous fat may be visible but bone, tendon or muscle are NOT exposed.  Wound Description (Comments): 1.5 cm by 1 cm  Present on Admission: Yes     Pressure Injury Pretibial Distal;Left Unstageable - Full thickness tissue loss in which the base of the injury is covered by slough (yellow, tan, gray, green or brown) and/or eschar (tan, brown or black) in the wound bed. 2  cm x 1 cm (Active)     Location: Pretibial  Location Orientation: Distal;Left  Staging: Unstageable - Full thickness  tissue loss in which the base of the injury is covered by slough (yellow, tan, gray, green or brown) and/or eschar (tan, brown or black) in the wound bed.  Wound Description (Comments): 2 cm x 1 cm  Present on Admission: Yes     Pressure Injury 07/10/21 Groin Medial Stage 1 -  Intact skin with non-blanchable redness of Tywanna Seifer localized area usually over Cintya Daughety bony prominence. medical equipment PI: lines of nonblanchable redness where purewick tubing might have been (Active)  07/10/21 1906  Location: Groin  Location Orientation: Medial  Staging: Stage 1 -  Intact skin with non-blanchable redness of Talayeh Bruinsma localized area usually over Matilyn Fehrman bony prominence.  Wound Description (Comments): medical equipment PI: lines of nonblanchable redness where purewick tubing might have been  Present on Admission: Yes   Appreciate wound care recs - I think some of this may have been related to initial foley placement given appearance, will continue to monitor closely     Contamination of blood culture 1 of 2 from 12/28 with staph epidermidis. Likely contaminant.  Morbid obesity ([place])- (present on admission) Body mass index is 44.25 kg/m.  Dietitian recommendations: 48 hour Calorie Count (07/19/2021) Ensure Enlive po BID, each supplement provides 350 kcal and 20 grams of protein Prosource Plus PO BID, each provides 100 kcals and 15g protein Magic cup TID with meals, each supplement provides 290 kcal and 9 grams of protein  Beneprotein powder TID, each provides 25 kcals and 6g protein. Encouraged PO intakes and supplements  Hypothyroidism- (present on admission) Synthroid recently increased from 112 mcg to 125 mcg (on 12/19 dc summary noted). TSH slightly improved this admission. -Continue Synthroid -Repeat TSH in 2-3 weeks; increase Synthroid if TSH still elevated   DVT prophylaxis: SCD Code Status: full Family Communication: husband at bedside Disposition:   Status is: Inpatient  Remains inpatient appropriate  because: plan for d/c       Consultants:  GI Palliative care Oncology Psychiatry Cardiology nephrology  Procedures:  UPPER GI ENDOSCOPY (07/12/2021) Impression:               - Normal esophagus.                           - Erythematous mucosa in the gastric body.                           - Erythematous duodenopathy.                           - Non-bleeding duodenal ulcer with Lysette Lindenbaum clean ulcer                            base ([place] Class III).                           - No specimens collected.   Recommendation:           - Resume regular diet.                           - Use Protonix (pantoprazole) 40 mg PO BID for 1  month.                           - Use sucralfate suspension 1 gram PO QID for 2                            weeks.                           - Stool for H pylori Antigen and celiac serology.  Antimicrobials:  Anti-infectives (From admission, onward)    Start     Dose/Rate Route Frequency Ordered Stop   07/28/21 1200  rifaximin (XIFAXAN) tablet 550 mg        550 mg Oral 2 times daily 07/28/21 1051     07/11/21 0800  cefTRIAXone (ROCEPHIN) 2 g in sodium chloride 0.9 % 100 mL IVPB  Status:  Discontinued        2 g 200 mL/hr over 30 Minutes Intravenous Every 24 hours 07/10/21 1807 07/11/21 0906   07/10/21 0700  cefTRIAXone (ROCEPHIN) 2 g in sodium chloride 0.9 % 100 mL IVPB        2 g 200 mL/hr over 30 Minutes Intravenous  Once 07/10/21 0654 07/10/21 0816       Subjective: Lethargic after pain meds  Objective: Vitals:   07/27/21 1545 07/27/21 2136 07/28/21 0549 07/28/21 1355  BP: (!) 102/56 125/74 94/70 (!) 94/38  Pulse: 92 91 98 99  Resp: 16 18 18 18   Temp: 98.8 F (37.1 C) 98.9 F (37.2 C) 98.7 F (37.1 C) 98.8 F (37.1 C)  TempSrc: Oral Oral Oral Oral  SpO2: 97% 100% 97% 98%  Weight:      Height:        Intake/Output Summary (Last 24 hours) at 07/28/2021 1541 Last data filed at 07/28/2021 1400 Gross per 24 hour   Intake 1130 ml  Output 2150 ml  Net -1020 ml   Filed Weights   07/16/21 0500 07/23/21 1058 07/26/21 0521  Weight: 132 kg (!) 168.7 kg (!) 165.6 kg    Examination:  General: No acute distress. Cardiovascular: RRR Lungs: unlabored Abdomen: Soft, nontender, nondistended  Neurological: lethargic, encephalopathic - partially due to pain meds  Skin: Warm and dry. No rashes or lesions. Extremities: anasarca       Data Reviewed: I have personally reviewed following labs and imaging studies  CBC: Recent Labs  Lab 07/22/21 1106 07/25/21 0423 07/26/21 0413 07/26/21 2110 07/27/21 0527 07/28/21 0431  WBC 12.1* 8.9 6.8  --  6.9 9.2  NEUTROABS  --  3.5 2.8  --  2.5 3.6  HGB 7.8* 7.3* 6.3* 7.3* 7.0* 9.4*  HCT 25.3* 24.1* 20.4* 23.4* 22.9* 29.2*  MCV 98.8 104.8* 100.0  --  100.0 95.7  PLT 86* 82* 61*  --  57* 59*    Basic Metabolic Panel: Recent Labs  Lab 07/24/21 1918 07/25/21 0423 07/26/21 0413 07/27/21 0527 07/28/21 0431  NA 137 138 139 143 145  K 4.7 4.3 4.0 3.6 4.2  CL 110 110 109 114* 116*  CO2 19* 19* 21* 22 21*  GLUCOSE 115* 82 77 85 108*  BUN 93* 90* 84* 83* 79*  CREATININE 1.87* 1.67* 1.59* 1.37* 1.47*  CALCIUM 7.5* 7.6* 7.8* 8.1* 8.3*  MG  --  2.4 2.6* 2.5* 2.5*  PHOS  --  4.7* 4.6 4.1 3.2    GFR: Estimated  Creatinine Clearance: 52.9 mL/min (Sorren Vallier) (by C-G formula based on SCr of 1.47 mg/dL (H)).  Liver Function Tests: Recent Labs  Lab 07/25/21 0423 07/26/21 0413 07/27/21 0527 07/28/21 0431  AST 19 19 19 23   ALT 22 21 22 24   ALKPHOS 318* 279* 259* 283*  BILITOT 1.4* 1.4* 1.7* 1.7*  PROT 4.9* 4.5* 4.9* 5.3*  ALBUMIN 1.9* 2.1* 2.8* 2.9*    CBG: No results for input(s): GLUCAP in the last 168 hours.   No results found for this or any previous visit (from the past 240 hour(s)).       Radiology Studies: [place] ASCITES (ABDOMEN LIMITED)  Result Date: 07/27/2021 CLINICAL DATA:  Ascites EXAM: LIMITED ABDOMEN ULTRASOUND FOR ASCITES TECHNIQUE:  Limited ultrasound survey for ascites was performed in all four abdominal quadrants. COMPARISON:  CT 06/26/2021 FINDINGS: Small-moderate volume ascites is seen within all 4 abdominal quadrants. Largest pocket appears to be within the left lower quadrant. IMPRESSION: Small-moderate volume ascites. Electronically Signed   By: Davina Poke D.O.   On: 07/27/2021 17:57        Scheduled Meds:  (feeding supplement) PROSource Plus  30 mL Oral BID BM   sodium chloride   Intravenous Once   ascorbic acid  500 mg Oral BID   Chlorhexidine Gluconate Cloth  6 each Topical Daily   cholecalciferol  5,000 Units Oral Daily   feeding supplement  237 mL Oral BID BM   folic acid  1 mg Oral Daily   furosemide  40 mg Intravenous Q8H   lactobacillus acidophilus & bulgar  2 tablet Oral TID   lactulose  20 g Oral TID   levothyroxine  125 mcg Oral Q0600   liver oil-zinc oxide   Topical Q4H   midodrine  10 mg Oral TID WC   mirtazapine  15 mg Oral QHS   multivitamin with minerals  1 tablet Oral Daily   pantoprazole  40 mg Oral BID AC   protein supplement  1 Scoop Oral TID WC   rifaximin  550 mg Oral BID   sucralfate  1 g Oral TID WC & HS   [START ON 07/31/2021] thiamine  100 mg Oral Daily   Continuous Infusions:  thiamine injection        LOS: 17 days    Time spent: over 30 min    Fayrene Helper, MD [facility]   To contact the attending provider between 7A-7P or the covering provider during after hours 7P-7A, please log into the web site [email or web address] and access using universal [place] password for that web site. If you do not have the password, please call the hospital operator.  07/28/2021, 3:41 PM

## 2021-07-28 NOTE — Progress Notes (Signed)
OT Cancellation Note  Patient Details Name: KAYLIEGH BOYERS MRN: [id number] DOB: [birth year]   Cancelled Treatment:    Reason Eval/Treat Not Completed: Other (comment). Hold per RN. Family discussing GOC.  Abir Craine L Leathia Farnell 07/28/2021, 1:08 PM

## 2021-07-29 ENCOUNTER — Inpatient Hospital Stay (HOSPITAL_COMMUNITY): Payer: Medicare Other

## 2021-07-29 LAB — COMPREHENSIVE METABOLIC PANEL
ALT: 24 U/L (ref 0–44)
AST: 24 U/L (ref 15–41)
Albumin: 2.6 g/dL — ABNORMAL LOW (ref 3.5–5.0)
Alkaline Phosphatase: 271 U/L — ABNORMAL HIGH (ref 38–126)
Anion gap: 9 (ref 5–15)
BUN: 77 mg/dL — ABNORMAL HIGH (ref 8–23)
CO2: 20 mmol/L — ABNORMAL LOW (ref 22–32)
Calcium: 8.2 mg/dL — ABNORMAL LOW (ref 8.9–10.3)
Chloride: 116 mmol/L — ABNORMAL HIGH (ref 98–111)
Creatinine, Ser: 1.75 mg/dL — ABNORMAL HIGH (ref 0.44–1.00)
GFR, Estimated: 30 mL/min — ABNORMAL LOW (ref >60)
Glucose, Bld: 95 mg/dL (ref 70–99)
Potassium: 4.2 mmol/L (ref 3.5–5.1)
Sodium: 145 mmol/L (ref 135–145)
Total Bilirubin: 1.5 mg/dL — ABNORMAL HIGH (ref 0.3–1.2)
Total Protein: 5.2 g/dL — ABNORMAL LOW (ref 6.5–8.1)

## 2021-07-29 LAB — CBC WITH DIFFERENTIAL/PLATELET
Abs Immature Granulocytes: 0.1 10*3/uL — ABNORMAL HIGH (ref 0.00–0.07)
Basophils Absolute: 0 10*3/uL (ref 0.0–0.1)
Basophils Relative: 0 %
Eosinophils Absolute: 0.4 10*3/uL (ref 0.0–0.5)
Eosinophils Relative: 4 %
HCT: 28.7 % — ABNORMAL LOW (ref 36.0–46.0)
Hemoglobin: 9.1 g/dL — ABNORMAL LOW (ref 12.0–15.0)
Immature Granulocytes: 1 %
Lymphocytes Relative: 41 %
Lymphs Abs: 4.3 10*3/uL — ABNORMAL HIGH (ref 0.7–4.0)
MCH: 30.8 pg (ref 26.0–34.0)
MCHC: 31.7 g/dL (ref 30.0–36.0)
MCV: 97.3 fL (ref 80.0–100.0)
Monocytes Absolute: 1.4 10*3/uL — ABNORMAL HIGH (ref 0.1–1.0)
Monocytes Relative: 14 %
Neutro Abs: 4.2 10*3/uL (ref 1.7–7.7)
Neutrophils Relative %: 40 %
Platelets: 63 10*3/uL — ABNORMAL LOW (ref 150–400)
RBC: 2.95 MIL/uL — ABNORMAL LOW (ref 3.87–5.11)
RDW: 26.2 % — ABNORMAL HIGH (ref 11.5–15.5)
WBC: 10.4 10*3/uL (ref 4.0–10.5)
nRBC: 0.4 % — ABNORMAL HIGH (ref 0.0–0.2)

## 2021-07-29 LAB — TYPE AND SCREEN
ABO/RH(D): A POS
Antibody Screen: NEGATIVE
Unit division: 0
Unit division: 0

## 2021-07-29 LAB — BPAM RBC
Blood Product Expiration Date: 202301272359
Blood Product Expiration Date: 202301312359
ISSUE DATE / TIME: 202301131508
ISSUE DATE / TIME: 202301141220
Unit Type and Rh: 6200
Unit Type and Rh: 6200

## 2021-07-29 LAB — MAGNESIUM: Magnesium: 2.5 mg/dL — ABNORMAL HIGH (ref 1.7–2.4)

## 2021-07-29 LAB — AMMONIA: Ammonia: 145 umol/L — ABNORMAL HIGH (ref 9–35)

## 2021-07-29 LAB — PHOSPHORUS: Phosphorus: 4 mg/dL (ref 2.5–4.6)

## 2021-07-29 MED ORDER — LORAZEPAM 2 MG/ML PO CONC: 1.0000 mg | ORAL | Status: DC | PRN

## 2021-07-29 MED ORDER — GLYCOPYRROLATE 1 MG PO TABS
1.0000 mg | ORAL_TABLET | ORAL | Status: DC | PRN
  Filled 2021-07-29: qty 1

## 2021-07-29 MED ORDER — HALOPERIDOL LACTATE 2 MG/ML PO CONC
0.5000 mg | ORAL | Status: DC | PRN
  Filled 2021-07-29: qty 0.3

## 2021-07-29 MED ORDER — GLYCOPYRROLATE 0.2 MG/ML IJ SOLN
0.2000 mg | INTRAMUSCULAR | Status: DC | PRN
  Filled 2021-07-29: qty 1

## 2021-07-29 MED ORDER — PROCHLORPERAZINE MALEATE 10 MG PO TABS
10.0000 mg | ORAL_TABLET | Freq: Four times a day (QID) | ORAL | Status: DC | PRN
  Filled 2021-07-29: qty 1

## 2021-07-29 MED ORDER — ACETAMINOPHEN 650 MG RE SUPP: 650.0000 mg | Freq: Four times a day (QID) | RECTAL | Status: DC | PRN

## 2021-07-29 MED ORDER — HYDROMORPHONE HCL 1 MG/ML IJ SOLN: 0.5000 mg | INTRAMUSCULAR | Status: DC | PRN

## 2021-07-29 MED ORDER — LORAZEPAM 1 MG PO TABS: 1.0000 mg | ORAL_TABLET | ORAL | Status: DC | PRN

## 2021-07-29 MED ORDER — HALOPERIDOL 0.5 MG PO TABS
0.5000 mg | ORAL_TABLET | ORAL | Status: DC | PRN
  Filled 2021-07-29: qty 1

## 2021-07-29 MED ORDER — POLYVINYL ALCOHOL 1.4 % OP SOLN
1.0000 [drp] | Freq: Four times a day (QID) | OPHTHALMIC | Status: DC | PRN
  Filled 2021-07-29: qty 15

## 2021-07-29 MED ORDER — PROCHLORPERAZINE EDISYLATE 10 MG/2ML IJ SOLN: 10.0000 mg | Freq: Two times a day (BID) | INTRAMUSCULAR | Status: DC | PRN

## 2021-07-29 MED ORDER — LACTULOSE 10 GM/15ML PO SOLN: 30.0000 g | Freq: Three times a day (TID) | ORAL | Status: DC

## 2021-07-29 MED ORDER — PROCHLORPERAZINE 25 MG RE SUPP
25.0000 mg | Freq: Two times a day (BID) | RECTAL | Status: DC | PRN
  Filled 2021-07-29: qty 1

## 2021-07-29 MED ORDER — LACTULOSE ENEMA
300.0000 mL | Freq: Once | ORAL | Status: DC
  Filled 2021-07-29: qty 300

## 2021-07-29 MED ORDER — LORAZEPAM 2 MG/ML IJ SOLN: 1.0000 mg | INTRAMUSCULAR | Status: DC | PRN

## 2021-07-29 MED ORDER — ACETAMINOPHEN 325 MG PO TABS: 650.0000 mg | ORAL_TABLET | Freq: Four times a day (QID) | ORAL | Status: DC | PRN

## 2021-07-29 MED ORDER — BIOTENE DRY MOUTH MT LIQD: 15.0000 mL | OROMUCOSAL | Status: DC | PRN

## 2021-07-29 MED ORDER — HALOPERIDOL LACTATE 5 MG/ML IJ SOLN: 0.5000 mg | INTRAMUSCULAR | Status: DC | PRN

## 2021-07-29 NOTE — Progress Notes (Signed)
Daily Progress Note   Patient Name: Sally Wise       Date: 07/29/2021 DOB: 1946/05/27  Age: 76 y.o. MRN#: [id number] Attending Physician: Elodia Florence., * Primary Care Physician: [name], MD Admit Date: 07/10/2021  Reason for Consultation/Follow-up: Establishing goals of care  Subjective: Discussed case with Dr. Florene Glen.  I met today with Sally Wise and Sally Wise husband, [name].  She is more confused today and, while alert, I am not sure she really processes Sally Wise conversation.  Sally Wise husband and I discussed the overall decline she has had in Sally Wise nutrition, cognition, and functional status over the past several months.  We talked about artificial nutrition and hydration and how this may extend Sally Wise life, however, it is not likely to regain functional status over what she had prior to coming to the hospital.  I asked him to talk further with his wife if she is able to participate and consider in conjunction with Sally Wise daughters regarding Sally Wise quality of life prior to this admission.  We also discussed consideration for time-limited trial of tube feeds if this is something his wife would want to pursue.  Discussed that if she is not having improvement in Sally Wise overall quality of life after starting feeds, this could be discontinued at some point in the future (mention 1 or 79-monthtrial).  Length of Stay: 18  Current Medications: Scheduled Meds:   (feeding supplement) PROSource Plus  30 mL Oral BID BM   ascorbic acid  500 mg Oral BID   Chlorhexidine Gluconate Cloth  6 each Topical Daily   cholecalciferol  5,000 Units Oral Daily   feeding supplement  237 mL Oral BID BM   folic acid  1 mg Oral Daily   furosemide  40 mg Intravenous Q8H   lactobacillus acidophilus & bulgar  2 tablet  Oral TID   lactulose  20 g Oral TID   levothyroxine  125 mcg Oral Q0600   liver oil-zinc oxide   Topical Q4H   midodrine  10 mg Oral TID WC   mirtazapine  15 mg Oral QHS   multivitamin with minerals  1 tablet Oral Daily   pantoprazole  40 mg Oral BID AC   protein supplement  1 Scoop Oral TID WC   rifaximin  550 mg Oral BID  sucralfate  1 g Oral TID WC & HS   [START ON 07/31/2021] thiamine  100 mg Oral Daily    Continuous Infusions:  thiamine injection 500 mg (07/29/21 0808)    PRN Meds: acetaminophen **OR** acetaminophen, bisacodyl, HYDROcodone-acetaminophen, HYDROmorphone (DILAUDID) injection, liver oil-zinc oxide, ondansetron (ZOFRAN) IV, promethazine **OR** promethazine  Physical Exam     General: Alert, awake, in no acute distress.  ?  Mild confusion HEENT: No bruits, no goiter, no JVD Heart: Regular rate and rhythm. No murmur appreciated. Lungs: Good air movement, clear Skin: Warm and dry Neuro: Grossly intact, nonfocal.      Psych: Flat affect Vital Signs: BP 102/63 (BP Location: Right Arm)    Pulse 92    Temp 99.2 F (37.3 C) (Oral)    Resp 16    Ht 5' 8"  (1.727 m)    Wt (!) 166 kg    LMP 07/14/2001    SpO2 97%    BMI 55.65 kg/m  SpO2: SpO2: 97 % O2 Device: O2 Device: Room Air O2 Flow Rate:    Intake/output summary:  Intake/Output Summary (Last 24 hours) at 07/29/2021 0934 Last data filed at 07/29/2021 0600 Gross per 24 hour  Intake 504 ml  Output 2375 ml  Net -1871 ml    LBM: Last BM Date: 07/29/21 Baseline Weight: Weight: (!) 144.2 kg Most recent weight: Weight: (!) 166 kg       Palliative Assessment/Data:      Patient Active Problem List   Diagnosis Date Noted   Goals of care, counseling/discussion 07/26/2021   Hyperkalemia 07/24/2021   AKI (acute kidney injury) ([place]) 78/46/9629   Acute metabolic encephalopathy 52/84/1324   Lack of appetite 07/14/2021   PUD (peptic ulcer disease) 07/14/2021   Elevated INR 07/14/2021   Fatty liver disease,  nonalcoholic 40/04/2724   Contamination of blood culture 07/14/2021   Anasarca 07/11/2021   History of COVID-19 06/29/2021   Foley catheter in place 06/29/2021   Failure to thrive in adult 06/27/2021   Hypoalbuminemia 06/27/2021   Depression 06/27/2021   CAP (community acquired pneumonia) 06/27/2021   Pleural effusion 06/27/2021   Daytime somnolence 06/27/2021   Pressure ulcer 06/25/2021   Acute renal failure superimposed on stage 3a chronic kidney disease ([place]) 06/24/2021   Hypotension 06/24/2021   Ankle fracture 04/28/2021   Chronic diastolic CHF (congestive heart failure) ([place]) 04/28/2021   Anemia 04/28/2021   Prolonged QT interval 04/28/2021   Mucoid diarrhea 03/16/2021   Diarrhea 03/14/2021   Acute on chronic diastolic HF (heart failure) ([place]) 08/24/2020   Obesity, Class III, BMI 40-49.9 (morbid obesity) ([place]) 08/24/2020   Chronic kidney disease with active medical management without dialysis, stage 3 (moderate) ([place]) 08/24/2020   Combined rheumatic disorders of mitral, 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   Congestive rheumatic heart failure ([place]) 08/24/2020   Hardening of the aorta (main artery of the heart) ([place]) 08/24/2020   Kidney stone 08/24/2020   Pure hypercholesterolemia 08/24/2020   Scoliosis of thoracic spine 08/24/2020   Solitary pulmonary nodule 08/24/2020   Other long term (current) drug therapy 08/24/2020   Abnormal findings on diagnostic imaging of other specified body structures 08/24/2020   Acute respiratory failure with hypoxia ([place]) 06/04/2020   Multifocal pneumonia 06/03/2020   Low back pain 12/12/2019   History of total knee replacement, right 36/64/4034   Lichen sclerosus et atrophicus 01/04/2015   LVH (left ventricular hypertrophy)    PAF (paroxysmal atrial fibrillation) ([place]) 07/06/2014   Thrombocytopenia ([place]) 07/06/2014   Hyperglycemia  07/06/2014   Morbid obesity ([place]) 01/25/2013   Hypothyroidism 01/15/2013   History of total knee  replacement, left 08/09/2007    Palliative Care Assessment & Plan  Recommendations/Plan: Full code/full scope Continue discussion with patient and Sally Wise husband regarding goals of care in light of Sally Wise poor nutritional status and consideration for artificial nutrition and hydration.  She is more confused today and Sally Wise husband is struggling with determining best way forward as he wants to support his wife with what she would want but is also struggling with the fact that this is related to poor nutrition which seems like it should be a "fixable" problem.  I have asked him to consider in conjunction with Sally Wise daughters regarding what Sally Wise quality of life was prior to this admission.  We are planning to meet again tomorrow to continue discussion.  Goals of Care and Additional Recommendations: Limitations on Scope of Treatment: Full Scope Treatment  Code Status:    Code Status Orders  (From admission, onward)           Start     Ordered   07/10/21 1808  Full code  Continuous        07/10/21 1807           Code Status History     Date Active Date Inactive Code Status Order ID Comments User Context   06/24/2021 2238 07/02/2021 0237 Full Code [id number]  Lenore Cordia, MD ED   04/28/2021 2139 05/13/2021 2010 Full Code [id number]  Toy Baker, MD Inpatient   03/14/2021 1753 03/17/2021 1704 Full Code [id number]  Antonieta Pert, MD ED   06/03/2020 1720 06/08/2020 1927 Full Code [id number]  Jonnie Finner, DO Inpatient       Prognosis:  Unable to determine  Discharge Planning: To Be Determined  Care plan was discussed with patient and Sally Wise husband  Thank you for allowing the Palliative Medicine Team to assist in the care of this patient.   Total Time 55 minutes Prolonged Time Billed No   Micheline Rough, MD  Please contact Palliative Medicine Team phone at [phone] for questions and concerns.

## 2021-07-29 NOTE — Progress Notes (Signed)
Nephrology Follow-Up Consult note   Assessment/Recommendations: Sally Wise is a/an 76 y.o. female with a past medical history significant for atrial fibrillation, hypothyroidism, thrombocytopenia, CHF, HLD, HTN, depression, admitted for failure to thrive.     AKI on CKD3a: BL Crt around 1.3. now w/ anasarca of multiple etiologies but likely liver disease is contributing. Crt back up to 1.8 today. Would hold diuretics and continue to dose intermittently. Difficult situation and may not have much we could do. Dialysis unlikely a viable option  Hypotension: on midodrine  Ansarca: multifactorial. Dosing diuretics as able  FTT: agree with palliative care.  Possible Liver cirrhosis: overall picture concerning for this. Continue work up and mgmt per primary and GI.   Recommendations conveyed to primary service.    Darnell Level  [facility] 07/29/2021 2:29 PM  ___________________________________________________________  CC: AKI on CKD  Interval History/Subjective: Patient unable to interact today.  Does not answer questions.  No complaints   Medications:  Current Facility-Administered Medications  Medication Dose Route Frequency Provider Last Rate Last Admin   acetaminophen (TYLENOL) tablet 650 mg  650 mg Oral Q6H PRN Romie Minus, MD       Or   acetaminophen (TYLENOL) suppository 650 mg  650 mg Rectal Q6H PRN Romie Minus, MD       antiseptic oral rinse (BIOTENE) solution 15 mL  15 mL Topical PRN Romie Minus, MD       Chlorhexidine Gluconate Cloth 2 % PADS 6 each  6 each Topical Daily Kerin Salen, MD   6 each at 07/28/21 1102   glycopyrrolate (ROBINUL) tablet 1 mg  1 mg Oral Q4H PRN Romie Minus, MD       Or   glycopyrrolate (ROBINUL) injection 0.2 mg  0.2 mg Subcutaneous Q4H PRN Romie Minus, MD       Or   glycopyrrolate (ROBINUL) injection 0.2 mg  0.2 mg Intravenous Q4H PRN Romie Minus, MD       haloperidol (HALDOL) tablet 0.5 mg  0.5 mg Oral Q4H  PRN Romie Minus, MD       Or   haloperidol (HALDOL) 2 MG/ML solution 0.5 mg  0.5 mg Sublingual Q4H PRN Romie Minus, MD       Or   haloperidol lactate (HALDOL) injection 0.5 mg  0.5 mg Intravenous Q4H PRN Romie Minus, MD       HYDROmorphone (DILAUDID) injection 0.5 mg  0.5 mg Intravenous Q2H PRN Romie Minus, MD       liver oil-zinc oxide (DESITIN) 40 % ointment   Topical PRN Narda Bonds, MD   Given at 07/20/21 1747   liver oil-zinc oxide (DESITIN) 40 % ointment   Topical Q4H Zigmund Daniel., MD   1 application at 07/29/21 1345   LORazepam (ATIVAN) tablet 1 mg  1 mg Oral Q4H PRN Romie Minus, MD       Or   LORazepam (ATIVAN) 2 MG/ML concentrated solution 1 mg  1 mg Sublingual Q4H PRN Romie Minus, MD       Or   LORazepam (ATIVAN) injection 1 mg  1 mg Intravenous Q4H PRN Romie Minus, MD       ondansetron (ZOFRAN) injection 4 mg  4 mg Intravenous Q6H PRN Zigmund Daniel., MD       polyvinyl alcohol (LIQUIFILM TEARS) 1.4 % ophthalmic solution 1 drop  1 drop Both Eyes QID PRN Romie Minus, MD       prochlorperazine (COMPAZINE) tablet 10 mg  10  mg Oral Q6H PRN Romie Minus, MD       Or   prochlorperazine (COMPAZINE) suppository 25 mg  25 mg Rectal Q12H PRN Romie Minus, MD       Or   prochlorperazine (COMPAZINE) injection 10 mg  10 mg Intravenous Q12H PRN Romie Minus, MD          Review of Systems: 10 systems reviewed and negative except per interval history/subjective  Physical Exam: Vitals:   07/28/21 2110 07/29/21 0449  BP: 94/62 102/63  Pulse: 90 92  Resp: 18 16  Temp: 98.7 F (37.1 C) 99.2 F (37.3 C)  SpO2: 99% 97%   Total I/O In: 0  Out: 550 [Urine:550]  Intake/Output Summary (Last 24 hours) at 07/29/2021 1429 Last data filed at 07/29/2021 1400 Gross per 24 hour  Intake 254 ml  Output 2475 ml  Net -2221 ml   Constitutional: Chronically ill-appearing, lying in bed, obese ENMT: ears and nose without scars or lesions, MMM CV: normal rate,  extensive total body anasarca Respiratory: Bilateral chest rise, normal work of breathing Gastrointestinal: soft, non-tender, no palpable masses or hernias Skin: no visible lesions or rashes Psych: Tired, lethargic, does not answer questioning   Test Results I personally reviewed new and old clinical labs and radiology tests Lab Results  Component Value Date   NA 145 07/29/2021   K 4.2 07/29/2021   CL 116 (H) 07/29/2021   CO2 20 (L) 07/29/2021   BUN 77 (H) 07/29/2021   CREATININE 1.75 (H) 07/29/2021   GFR 73.14 07/04/2014   CALCIUM 8.2 (L) 07/29/2021   ALBUMIN 2.6 (L) 07/29/2021   PHOS 4.0 07/29/2021      Unable to obtain due to the patient's altered mental status

## 2021-07-29 NOTE — Progress Notes (Signed)
Daily Progress Note   Patient Name: Sally Wise       Date: 07/29/2021 DOB: [birth year]  Age: 76 y.o. MRN#: [id number] Attending Physician: Elodia Florence., * Primary Care Physician: [name], MD Admit Date: 07/10/2021  Reason for Consultation/Follow-up: Establishing goals of care  Subjective: Discussed with Dr. Florene Glen.  I saw and examined Sally Wise today.  She was largely somnolent and confused.  I met with Sally Wise, Sally Wise, in the hall.  We discussed Sally clinical course and options for care moving forward.  Concepts specific to code status and care plan this hospitalization discussed.  We discussed difference between a aggressive medical intervention path and a palliative, comfort focused care path.    Wes reports that he and his daughters have been discussing and really they think that Sally Wise wish moving forward is to focus on comfort and dignity as she approaches end-of-life.  He reports she has been unhappy for some time at the skilled facility where she has been living and she has been clear that she does not want a feeding tube.  In light of Sally continued decline, we discussed that she will continue to decline quickly, and she has been decompensating over the past 48 hours with worsening encephalopathy.  We discussed plan for comfort and consideration for residential hospice.  Questions and concerns addressed.   PMT will continue to support holistically.  Length of Stay: 18  Current Medications: Scheduled Meds:   Chlorhexidine Gluconate Cloth  6 each Topical Daily   liver oil-zinc oxide   Topical Q4H    Continuous Infusions:    PRN Meds: acetaminophen **OR** acetaminophen, antiseptic oral rinse, glycopyrrolate **OR** glycopyrrolate **OR**  glycopyrrolate, haloperidol **OR** haloperidol **OR** haloperidol lactate, HYDROmorphone (DILAUDID) injection, liver oil-zinc oxide, LORazepam **OR** LORazepam **OR** LORazepam, ondansetron (ZOFRAN) IV, polyvinyl alcohol, prochlorperazine **OR** prochlorperazine **OR** prochlorperazine  Physical Exam     General: Somnolent HEENT: No bruits, no goiter, no JVD Heart: Regular rate and rhythm. No murmur appreciated. Lungs: Good air movement, clear Skin: Warm and dry  Vital Signs: BP 102/63 (BP Location: Right Arm)    Pulse 92    Temp 99.2 F (37.3 C) (Oral)    Resp 16    Ht _0  (1.727 m)    Wt (!) 166 kg  LMP 07/14/2001    SpO2 97%    BMI 55.65 kg/m  SpO2: SpO2: 97 % O2 Device: O2 Device: Room Air O2 Flow Rate:    Intake/output summary:  Intake/Output Summary (Last 24 hours) at 07/29/2021 2315 Last data filed at 07/29/2021 2200 Gross per 24 hour  Intake 100 ml  Output 1350 ml  Net -1250 ml    LBM: Last BM Date: 07/29/21 Baseline Weight: Weight: (!) 144.2 kg Most recent weight: Weight: (!) 166 kg       Palliative Assessment/Data:      Patient Active Problem List   Diagnosis Date Noted   Comfort measures only status 07/26/2021   Hyperkalemia 07/24/2021   AKI (acute kidney injury) ([place]) 03/49/1791   Acute metabolic encephalopathy 50/56/9794   Lack of appetite 07/14/2021   PUD (peptic ulcer disease) 07/14/2021   Elevated INR 07/14/2021   Fatty liver disease, nonalcoholic 80/16/5537   Contamination of blood culture 07/14/2021   Anasarca 07/11/2021   History of COVID-19 06/29/2021   Foley catheter in place 06/29/2021   Failure to thrive in adult 06/27/2021   Hypoalbuminemia 06/27/2021   Depression 06/27/2021   CAP (community acquired pneumonia) 06/27/2021   Pleural effusion 06/27/2021   Daytime somnolence 06/27/2021   Pressure ulcer 06/25/2021   Acute renal failure superimposed on stage 3a chronic kidney disease ([place]) 06/24/2021   Hypotension 06/24/2021   Ankle  fracture 04/28/2021   Chronic diastolic CHF (congestive heart failure) ([place]) 04/28/2021   Anemia 04/28/2021   Prolonged QT interval 04/28/2021   Mucoid diarrhea 03/16/2021   Diarrhea 03/14/2021   Acute on chronic diastolic HF (heart failure) ([place]) 08/24/2020   Obesity, Class III, BMI 40-49.9 (morbid obesity) ([place]) 08/24/2020   Chronic kidney disease with active medical management without dialysis, stage 3 (moderate) ([place]) 08/24/2020   Combined rheumatic disorders of mitral, 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   Congestive rheumatic heart failure ([place]) 08/24/2020   Hardening of the aorta (main artery of the heart) ([place]) 08/24/2020   Kidney stone 08/24/2020   Pure hypercholesterolemia 08/24/2020   Scoliosis of thoracic spine 08/24/2020   Solitary pulmonary nodule 08/24/2020   Other long term (current) drug therapy 08/24/2020   Abnormal findings on diagnostic imaging of other specified body structures 08/24/2020   Acute respiratory failure with hypoxia ([place]) 06/04/2020   Multifocal pneumonia 06/03/2020   Low back pain 12/12/2019   History of total knee replacement, right 48/27/0786   Lichen sclerosus et atrophicus 01/04/2015   LVH (left ventricular hypertrophy)    PAF (paroxysmal atrial fibrillation) ([place]) 07/06/2014   Thrombocytopenia ([place]) 07/06/2014   Hyperglycemia 07/06/2014   Morbid obesity ([place]) 01/25/2013   Hypothyroidism 01/15/2013   History of total knee replacement, left 08/09/2007    Palliative Care Assessment & Plan  Recommendations/Plan: Full comfort moving forward Orders placed for comfort using end of life order set Monitor clinical course over the next 24 hours.  Begin initial discussion with Sally Wise about consideration for residential hospice so they can accommodate Sally. While I am going off service, I will ask another member of the palliative medicine team to follow-up tomorrow.  Goals of Care and Additional Recommendations: Limitations on Scope of  Treatment: Full Comfort Care  Prognosis:  < 2 weeks most likely.  She has not been taking in any nutrition and with the plan for comfort and worsening encephalopathy, I believe she will continue to decompensate quickly.  Discharge Planning: To Be Determined  Care plan was discussed with Dr. Florene Glen and Sally  Wise  Thank you for allowing the Palliative Medicine Team to assist in the care of this patient.   Total Time 60 minutes Prolonged Time Billed No   Micheline Rough, MD  Please contact Palliative Medicine Team phone at [phone] for questions and concerns.

## 2021-07-29 NOTE — Progress Notes (Signed)
[facility] Progress Note  Sally Wise 76 y.o. March 16, 1946  CC: Failure to thrive, confusion, possible cirrhosis of the liver   Subjective: Patient seen and examined at bedside.  Patient is lethargic.  Able to woke up with verbal command.  Husband at bedside.  Husband noted more confusion in last 2 days.  ROS : Not able to obtain   Objective: Vital signs in last 24 hours: Vitals:   07/28/21 2110 07/29/21 0449  BP: 94/62 102/63  Pulse: 90 92  Resp: 18 16  Temp: 98.7 F (37.1 C) 99.2 F (37.3 C)  SpO2: 99% 97%    Physical Exam: General : Morbidly obese, lethargic Abdomen : Normally distended, soft, bowel sounds present.  No peritoneal sign Neuro : Confused  Lab Results: Recent Labs    07/28/21 0431 07/29/21 0412  NA 145 145  K 4.2 4.2  CL 116* 116*  CO2 21* 20*  GLUCOSE 108* 95  BUN 79* 77*  CREATININE 1.47* 1.75*  CALCIUM 8.3* 8.2*  MG 2.5* 2.5*  PHOS 3.2 4.0   Recent Labs    07/28/21 0431 07/29/21 0412  AST 23 24  ALT 24 24  ALKPHOS 283* 271*  BILITOT 1.7* 1.5*  PROT 5.3* 5.2*  ALBUMIN 2.9* 2.6*   Recent Labs    07/28/21 0431 07/29/21 0412  WBC 9.2 10.4  NEUTROABS 3.6 4.2  HGB 9.4* 9.1*  HCT 29.2* 28.7*  MCV 95.7 97.3  PLT 59* 63*   Recent Labs    07/27/21 0527  LABPROT 26.2*  INR 2.4*   CT chest abdomen pelvis with contrast on July 11, 2021 showed questionable 2.6 mm nodular filling defect in the gallbladder.  Ultrasound in October 2022 did not showed cholelithiasis with 2.1 cm gallstone.  EGD on July 12, 2021 showed gastritis and tiny clean-based duodenal ulcer.   Assessment/Plan: -Encephalopathy with elevated ammonia.  Concern for underlying cirrhosis although not confirmed by imaging findings. -Failure to thrive.  Patient has previously declined tube feeding including NG tube feeding. -Acute on chronic kidney disease -History of  depression -Gallstones  Recommendations ------------------------- -Continue rifaximin -Increase lactulose to 30 g 3 times a day. -One-time lactulose enema -Long discussion with patient's husband as well as Dr. Lowell Guitar regarding enteral nutrition.  She may benefit from at least initiation of NG tube feeding but her previous desire was not to have any tube feeding.  Further decision to be made by family after discussion with palliative care.  -Approximately 35 minutes spent in the patient encounter with more than 25 minutes spent with coordination of care chart review and discussion with hospital attending as well as family.   Kathi Der MD, FACP 07/29/2021, 10:16 AM  Contact #  [phone]

## 2021-07-29 NOTE — Progress Notes (Signed)
Chaplain engaged in an initial visit with Damyra and her husband.  He shared about Alyscia's current state and healthcare journey.  He does not believe she is in a place cognitively of understanding what is happening around her or the questions she is being asked.  He did note that she was able to vocalize, "no" to a feeding tube.  He was also able to share how hard it has been to see his wife in this condition and make decisions.  He has talked with his daughters about what will be best for Joselinne and they don't desire for her to suffer.  He understands that even if she had a feeding tube that her quality of life would be limited.  Mr. Charrier knows that his wife has dealt with depression for quite some time and attributes where she is now to being in a nursing facility and confined to a bed for the last three months.  He believes that she doesn't want to continue like this.  They're still open to hearing all options but seem to have come to a place of understanding that Daylan may not want to continue living.   Chaplain offered support, presence, prayer, and listening.     07/29/21 1000  Clinical Encounter Type  Visited With Patient and family together  Visit Type Initial;Spiritual support  Referral From Palliative care team  Consult/Referral To Chaplain  Spiritual Encounters  Spiritual Needs Emotional;Grief support;Prayer  Stress Factors  Family Stress Factors Loss of control;Major life changes;Exhausted

## 2021-07-29 NOTE — Progress Notes (Signed)
OT Cancellation Note  Patient Details Name: Sally Wise MRN: [id number] DOB: September 27, 1945   Cancelled Treatment:    Reason Eval/Treat Not Completed: Patient not medically ready Patient currently refusing medications from nursing. OT to continue to follow and check back as schedule willl allow.  Sharyn Blitz OTR/L, MS Acute Rehabilitation Department Office# [phone] Pager# [phone]   07/29/2021, 11:34 AM

## 2021-07-29 NOTE — Progress Notes (Signed)
PROGRESS NOTE    Sally Wise  [id number] DOB: Jul 30, 1945 DOA: 07/10/2021 PCP: [name], MD  Chief Complaint  Patient presents with   Weakness    Brief Narrative:  76 yo with hx atrial fibrillation, hypothyroidism, chronic thrombocytopenia, HFpEF, HLD, HTN, depression and multiple other medical issues who presented with weakness and lethargy.  She's had poor PO intake.  Of note, she fractured her ankle in October 2022 and has been living at [facility] since then.  She was admitted with dehydration, hypotension, and AKI on 12/12 and returned on 12/28 with similar issues.  She was seen by GI on 12/30 for upper endoscopy which showed Anai Lipson nonbleeding duodenal ulcer, erythematous mucosa in the gastric body and erythematous duodenopathy.  Labs for H pylori pending and celiac labs are negative.  GI now signed off.  Heme consulted  (now signed off) due to persistently elevated INR.  Palliative was consulted as well.  She remains anasarcic with poor appetite.  At this time, trying to work towards discharge, but complicated by hypotension and AKI.  See below for additional details    Assessment & Plan:   Principal Problem:   Comfort measures only status Active Problems:   Acute metabolic encephalopathy   Hypotension   Anasarca   Lack of appetite   Fatty liver disease, nonalcoholic   Anemia   AKI (acute kidney injury) ([place])   PUD (peptic ulcer disease)   Chronic kidney disease with active medical management without dialysis, stage 3 (moderate) (HCC)   Elevated INR   Thrombocytopenia (HCC)   Hyperkalemia   Ankle fracture   Hypoalbuminemia   Pressure ulcer   Contamination of blood culture   Hypothyroidism   Morbid obesity (HCC)   PAF (paroxysmal atrial fibrillation) (HCC)   * Comfort measures only status Appreciate palliative cares assistance with this difficult case.  Sally Wise being transitioned to comfort measures today after discussion with palliative (Mr. Schicker  notes he'd discussed decision with daughters).  Acute metabolic encephalopathy Comfort measures as noted above With elevated ammonia, concern for hepatic encephalopathy [place] ascites search -> small to moderate volume ascites  TSH of 8.6 with normal free T4. Vitamin B12 and folate are within normal limits.  High dose thiamine Cortisol normal with Halsey Hammen normal ACTH stim test.  Psychiatry evaluated and recommended transition from Lexapro to Remeron.  MRI brain without acute process. Chronic ischemic changes noted. Discussed with neurology and findings do not explain patient presentation; dural thickening acknowledged but recommendation for no further workup per neurology unless neurologic symptoms arose.  Fatty liver disease, nonalcoholic Imaging showed fatty liver -> her constellation of findings make me concerned/suspicious regarding her synthetic liver function and presence of cirrhosis?  This may be most likely unifying diagnosis, though this is not definitively diagnosed. High INR despite vit K, thrombocytopenia, hypoalbuminemia, mildly elevated bilirubin Appreciate GI recommendations   Lack of appetite Unclear cause. Dietitian consulted. Psychiatry consulted. Patient started on Remeron to help with appetite. Per patient, she has no decrease in appetite but has particular eating habits. Patient declines enteral nutrition via feeding tube. GI consulted and patient underwent upper endoscopy with erythematous duodenopathy with associated non-bleeding duodenal ulcer with clean base. -Continue Remeron - She's continuing to decline tube feeding.  Anasarca- (present on admission) Now comfort measures Chronic. Likely related to chronic hypoalbuminemia.  Foley catheter placed on 07/11/2021 and removed on 07/19/2021. Echo with normal EF, grade I diastolic dysfunction Urine without notable proteinuria (UP/C 0.17) With hypotension now as  well Follow urine sodium as above Nephrology c/s, appreciate recs  - holding diuresis, midodrine, appreciate assistance Cardiology c/s - noted edema not cardiac, recommended enteral feeding or TPN   Hypotension- (present on admission) Now comfort measures Improved with albumin and midodrine She remains diffusely edematous with hypoalbuminemia, though suspect she's intravascularly dry with Una <10 2 units pRBC Continue midodrine Follow BP closely Follow urine sodium (low)  AKI (acute kidney injury) ([place]) Creatinine fluctuating Appreciate renals assistance in setting of hypotension, anasarca Low urine sodium Diuresis on hold Renal US without hydro  Anemia- (present on admission) Hb improved S/p 2 units No bleeding noted, follow  PUD (peptic ulcer disease) EGD with erythematous mucosa in gastric body, erythematous duodenopathy, nonbleeding duodenal ulcer with clean base. H. Pylori and celiac serology negative (negative TTG, reticulin ab, and gliadin ab). GI recommending Protonix 40 mg BID x1 month and sucralfate suspension 1 g PO QID x2 weeks.  -Re-order h pylori stool ab (negative)  Elevated INR Unclear etiology -> liver failure, cirrhosis?. Hematology consulted and have recommended IV vitamin K. Treatment completed. Follow INR - remains elevated  Chronic kidney disease with active medical management without dialysis, stage 3 (moderate) ([place])- (present on admission) Baseline creatinine of about 1.2  Thrombocytopenia ([place])- (present on admission) Chronic and stable. Likely related to underlying liver disease.  Hyperkalemia Hemolysis, improved  Ankle fracture- (present on admission) Left ankle. History of ORIF on recent admission on 05/07/21. Max weight bearing of 75 lbs in boot for one month.  Hypoalbuminemia- (present on admission) Possibly secondary to poor nutrition vs liver disease. Stable.  Pressure ulcer Pressure Injury 06/25/21 Coccyx Mid;Lower Stage 3 -  Full thickness tissue loss. Subcutaneous fat may be visible but bone,  tendon or muscle are NOT exposed. 1.5 cm by 1 cm (Active)  06/25/21 0130  Location: Coccyx  Location Orientation: Mid;Lower  Staging: Stage 3 -  Full thickness tissue loss. Subcutaneous fat may be visible but bone, tendon or muscle are NOT exposed.  Wound Description (Comments): 1.5 cm by 1 cm  Present on Admission: Yes     Pressure Injury Pretibial Distal;Left Unstageable - Full thickness tissue loss in which the base of the injury is covered by slough (yellow, tan, gray, green or brown) and/or eschar (tan, brown or black) in the wound bed. 2 cm x 1 cm (Active)     Location: Pretibial  Location Orientation: Distal;Left  Staging: Unstageable - Full thickness tissue loss in which the base of the injury is covered by slough (yellow, tan, gray, green or brown) and/or eschar (tan, brown or black) in the wound bed.  Wound Description (Comments): 2 cm x 1 cm  Present on Admission: Yes     Pressure Injury 07/10/21 Groin Medial Stage 1 -  Intact skin with non-blanchable redness of Tiras Bianchini localized area usually over Damond Borchers bony prominence. medical equipment PI: lines of nonblanchable redness where purewick tubing might have been (Active)  07/10/21 1906  Location: Groin  Location Orientation: Medial  Staging: Stage 1 -  Intact skin with non-blanchable redness of Acelynn Dejonge localized area usually over Kadence Mimbs bony prominence.  Wound Description (Comments): medical equipment PI: lines of nonblanchable redness where purewick tubing might have been  Present on Admission: Yes   Appreciate wound care recs - I think some of this may have been related to initial foley placement given appearance, will continue to monitor closely     Contamination of blood culture 1 of 2 from 12/28 with staph epidermidis. Likely contaminant.  Morbid obesity ([place])- (present on admission) Body mass index is 44.25 kg/m.  Dietitian recommendations: 48 hour Calorie Count (07/19/2021) Ensure Enlive po BID, each supplement provides 350 kcal and 20  grams of protein Prosource Plus PO BID, each provides 100 kcals and 15g protein Magic cup TID with meals, each supplement provides 290 kcal and 9 grams of protein  Beneprotein powder TID, each provides 25 kcals and 6g protein. Encouraged PO intakes and supplements  Hypothyroidism- (present on admission) Synthroid recently increased from 112 mcg to 125 mcg (on 12/19 dc summary noted). TSH slightly improved this admission. -Continue Synthroid -Repeat TSH in 2-3 weeks; increase Synthroid if TSH still elevated   DVT prophylaxis: SCD Code Status: full Family Communication: husband at bedside Disposition:   Status is: Inpatient  Remains inpatient appropriate because: plan for d/c       Consultants:  GI Palliative care Oncology Psychiatry Cardiology nephrology  Procedures:  UPPER GI ENDOSCOPY (07/12/2021) Impression:               - Normal esophagus.                           - Erythematous mucosa in the gastric body.                           - Erythematous duodenopathy.                           - Non-bleeding duodenal ulcer with Kirsti Mcalpine clean ulcer                            base ([place] Class III).                           - No specimens collected.   Recommendation:           - Resume regular diet.                           - Use Protonix (pantoprazole) 40 mg PO BID for 1                            month.                           - Use sucralfate suspension 1 gram PO QID for 2                            weeks.                           - Stool for H pylori Antigen and celiac serology.  Antimicrobials:  Anti-infectives (From admission, onward)    Start     Dose/Rate Route Frequency Ordered Stop   07/28/21 1200  rifaximin (XIFAXAN) tablet 550 mg  Status:  Discontinued        550 mg Oral 2 times daily 07/28/21 1051 07/29/21 1226   07/11/21 0800  cefTRIAXone (ROCEPHIN) 2 g in sodium chloride 0.9 % 100 mL IVPB  Status:  Discontinued        2 g 200  mL/hr over 30 Minutes  Intravenous Every 24 hours 07/10/21 1807 07/11/21 0906   07/10/21 0700  cefTRIAXone (ROCEPHIN) 2 g in sodium chloride 0.9 % 100 mL IVPB        2 g 200 mL/hr over 30 Minutes Intravenous  Once 07/10/21 0654 07/10/21 0816       Subjective: Lethargic, no complaints  Objective: Vitals:   07/28/21 1355 07/28/21 2110 07/29/21 0449 07/29/21 0450  BP: (!) 94/38 94/62 102/63   Pulse: 99 90 92   Resp: 18 18 16    Temp: 98.8 F (37.1 C) 98.7 F (37.1 C) 99.2 F (37.3 C)   TempSrc: Oral Oral Oral   SpO2: 98% 99% 97%   Weight:    (!) 166 kg  Height:        Intake/Output Summary (Last 24 hours) at 07/29/2021 1711 Last data filed at 07/29/2021 1541 Gross per 24 hour  Intake 304 ml  Output 1375 ml  Net -1071 ml   Filed Weights   07/23/21 1058 07/26/21 0521 07/29/21 0450  Weight: (!) 168.7 kg (!) 165.6 kg (!) 166 kg    Examination:  General: lethargic Cardiovascular: RRR Lungs: unlabored Abdomen: Soft, nontender, nondistended  Neurological: lethargic, awakes with constant stimulation, able to answer simple questions Extremities: anasarca        Data Reviewed: I have personally reviewed following labs and imaging studies  CBC: Recent Labs  Lab 07/25/21 0423 07/26/21 0413 07/26/21 2110 07/27/21 0527 07/28/21 0431 07/29/21 0412  WBC 8.9 6.8  --  6.9 9.2 10.4  NEUTROABS 3.5 2.8  --  2.5 3.6 4.2  HGB 7.3* 6.3* 7.3* 7.0* 9.4* 9.1*  HCT 24.1* 20.4* 23.4* 22.9* 29.2* 28.7*  MCV 104.8* 100.0  --  100.0 95.7 97.3  PLT 82* 61*  --  57* 59* 63*    Basic Metabolic Panel: Recent Labs  Lab 07/25/21 0423 07/26/21 0413 07/27/21 0527 07/28/21 0431 07/29/21 0412  NA 138 139 143 145 145  K 4.3 4.0 3.6 4.2 4.2  CL 110 109 114* 116* 116*  CO2 19* 21* 22 21* 20*  GLUCOSE 82 77 85 108* 95  BUN 90* 84* 83* 79* 77*  CREATININE 1.67* 1.59* 1.37* 1.47* 1.75*  CALCIUM 7.6* 7.8* 8.1* 8.3* 8.2*  MG 2.4 2.6* 2.5* 2.5* 2.5*  PHOS 4.7* 4.6 4.1 3.2 4.0    GFR: Estimated  Creatinine Clearance: 44.5 mL/min (Yanci Bachtell) (by C-G formula based on SCr of 1.75 mg/dL (H)).  Liver Function Tests: Recent Labs  Lab 07/25/21 0423 07/26/21 0413 07/27/21 0527 07/28/21 0431 07/29/21 0412  AST 19 19 19 23 24   ALT 22 21 22 24 24   ALKPHOS 318* 279* 259* 283* 271*  BILITOT 1.4* 1.4* 1.7* 1.7* 1.5*  PROT 4.9* 4.5* 4.9* 5.3* 5.2*  ALBUMIN 1.9* 2.1* 2.8* 2.9* 2.6*    CBG: No results for input(s): GLUCAP in the last 168 hours.   No results found for this or any previous visit (from the past 240 hour(s)).       Radiology Studies: No results found.      Scheduled Meds:  Chlorhexidine Gluconate Cloth  6 each Topical Daily   liver oil-zinc oxide   Topical Q4H   Continuous Infusions:      LOS: 18 days    Time spent: over 30 min    Fayrene Helper, MD [facility]   To contact the attending provider between 7A-7P or the covering provider during after hours 7P-7A, please log into the web site [email or web address]  and access using universal [place] password for that web site. If you do not have the password, please call the hospital operator.  07/29/2021, 5:11 PM

## 2021-07-30 NOTE — Progress Notes (Signed)
Daily Progress Note   Patient Name: Sally Wise       Date: 07/30/2021 DOB: [birth year]  Age: 76 y.o. MRN#: [id number] Attending Physician: Elodia Florence., * Primary Care Physician: [name], MD Admit Date: 07/10/2021  Reason for Consultation/Follow-up: Establishing goals of care  Subjective: Discussed with Dr. Florene Glen.  I saw and examined Sally Wise today.  She was largely somnolent and did not participate, call placed and discussed with husband.     We discussed plan for comfort and consideration for residential hospice.  Questions and concerns addressed.   PMT will continue to support holistically.  Length of Stay: 19  Current Medications: Scheduled Meds:   Chlorhexidine Gluconate Cloth  6 each Topical Daily   liver oil-zinc oxide   Topical Q4H    Continuous Infusions:    PRN Meds: acetaminophen **OR** acetaminophen, antiseptic oral rinse, glycopyrrolate **OR** glycopyrrolate **OR** glycopyrrolate, haloperidol **OR** haloperidol **OR** haloperidol lactate, HYDROmorphone (DILAUDID) injection, liver oil-zinc oxide, LORazepam **OR** LORazepam **OR** LORazepam, ondansetron (ZOFRAN) IV, polyvinyl alcohol, prochlorperazine **OR** prochlorperazine **OR** prochlorperazine  Physical Exam     General: Somnolent HEENT: No bruits, no goiter, no JVD Heart: Regular rate and rhythm. No murmur appreciated. Lungs: Good air movement, clear Skin: Warm and dry  Vital Signs: BP 102/63 (BP Location: Right Arm)    Pulse 92    Temp 99.2 F (37.3 C) (Oral)    Resp 16    Ht 5\' 8"  (1.727 m)    Wt (!) 166 kg    LMP 07/14/2001    SpO2 97%    BMI 55.65 kg/m  SpO2: SpO2: 97 % O2 Device: O2 Device: Room Air O2 Flow Rate:    Intake/output summary:  Intake/Output Summary (Last  24 hours) at 07/30/2021 1106 Last data filed at 07/30/2021 1000 Gross per 24 hour  Intake 110 ml  Output 650 ml  Net -540 ml    LBM: Last BM Date: 07/29/21 Baseline Weight: Weight: (!) 144.2 kg Most recent weight: Weight: (!) 166 kg       Palliative Assessment/Data:      Patient Active Problem List   Diagnosis Date Noted   Comfort measures only status 07/26/2021   Hyperkalemia 07/24/2021   AKI (acute kidney injury) ([place]) [id number]   Acute metabolic encephalopathy [id number]   Lack of  appetite 07/14/2021   PUD (peptic ulcer disease) 07/14/2021   Elevated INR 07/14/2021   Fatty liver disease, nonalcoholic [id number]   Contamination of blood culture 07/14/2021   Anasarca 07/11/2021   History of COVID-19 06/29/2021   Foley catheter in place 06/29/2021   Failure to thrive in adult 06/27/2021   Hypoalbuminemia 06/27/2021   Depression 06/27/2021   CAP (community acquired pneumonia) 06/27/2021   Pleural effusion 06/27/2021   Daytime somnolence 06/27/2021   Pressure ulcer 06/25/2021   Acute renal failure superimposed on stage 3a chronic kidney disease ([place]) 06/24/2021   Hypotension 06/24/2021   Ankle fracture 04/28/2021   Chronic diastolic CHF (congestive heart failure) ([place]) 04/28/2021   Anemia 04/28/2021   Prolonged QT interval 04/28/2021   Mucoid diarrhea 03/16/2021   Diarrhea 03/14/2021   Acute on chronic diastolic HF (heart failure) ([place]) 08/24/2020   Obesity, Class III, BMI 40-49.9 (morbid obesity) ([place]) 08/24/2020   Chronic kidney disease with active medical management without dialysis, stage 3 (moderate) ([place]) 08/24/2020   Combined rheumatic disorders of mitral, 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   Congestive rheumatic heart failure ([place]) 08/24/2020   Hardening of the aorta (main artery of the heart) ([place]) 08/24/2020   Kidney stone 08/24/2020   Pure hypercholesterolemia 08/24/2020   Scoliosis of thoracic spine 08/24/2020   Solitary pulmonary nodule  08/24/2020   Other long term (current) drug therapy 08/24/2020   Abnormal findings on diagnostic imaging of other specified body structures 08/24/2020   Acute respiratory failure with hypoxia ([place]) 06/04/2020   Multifocal pneumonia 06/03/2020   Low back pain 12/12/2019   History of total knee replacement, right 0000000   Lichen sclerosus et atrophicus 01/04/2015   LVH (left ventricular hypertrophy)    PAF (paroxysmal atrial fibrillation) ([place]) 07/06/2014   Thrombocytopenia ([place]) 07/06/2014   Hyperglycemia 07/06/2014   Morbid obesity ([place]) 01/25/2013   Hypothyroidism 01/15/2013   History of total knee replacement, left 08/09/2007    Palliative Care Assessment & Plan  Recommendations/Plan: Full comfort moving forward Orders placed for comfort using end of life order set Monitor clinical course over the next 24 hours.  Begin initial discussion with her husband about consideration for residential hospice so they can accommodate her. While I am going off service, I will ask another member of the palliative medicine team to follow-up tomorrow.  Goals of Care and Additional Recommendations: Limitations on Scope of Treatment: Full Comfort Care  Prognosis:  < 2 weeks most likely.  She has not been taking in any nutrition and with the plan for comfort and worsening encephalopathy, I believe she will continue to decompensate quickly.  Discharge Planning: Residential hospice.   Care plan was discussed with Dr. Florene Glen and her husband  Thank you for allowing the Palliative Medicine Team to assist in the care of this patient.   Total Time 35 minutes Prolonged Time Billed No   Loistine Chance, MD  Please contact Palliative Medicine Team phone at [phone] for questions and concerns.

## 2021-07-30 NOTE — Progress Notes (Signed)
-   Reviewed palliative care physician notes.  Patient has been made comfort care with consideration of residential hospice.  GI will sign off.  Call us back if needed.  Otis Brace MD, [place] 07/30/2021, 10:00 AM  Contact #  [phone]

## 2021-07-30 NOTE — Discharge Summary (Signed)
Physician Discharge Summary  [facility] [id number] DOB: 1946-07-10 DOA: 07/10/2021  PCP: [name], MD  Admit date: 07/10/2021 Discharge date: 07/30/2021  Time spent: 40 minutes  Recommendations for Outpatient Follow-up:  Follow with [facility] for comfort measures    Discharge Diagnoses:  Principal Problem:   Comfort measures only status Active Problems:   Acute metabolic encephalopathy   Hypotension   Anasarca   Lack of appetite   Fatty liver disease, nonalcoholic   Anemia   AKI (acute kidney injury) ([place])   PUD (peptic ulcer disease)   Chronic kidney disease with active medical management without dialysis, stage 3 (moderate) (HCC)   Elevated INR   Thrombocytopenia (HCC)   Hyperkalemia   Ankle fracture   Hypoalbuminemia   Pressure ulcer   Contamination of blood culture   Hypothyroidism   Morbid obesity ([place])   PAF (paroxysmal atrial fibrillation) ([place])   Discharge Condition: stable  Diet recommendation: for comfort  Filed Weights   07/23/21 1058 07/26/21 0521 07/29/21 0450  Weight: (!) 168.7 kg (!) 165.6 kg (!) 166 kg    History of present illness:  76 yo with hx atrial fibrillation, hypothyroidism, chronic thrombocytopenia, HFpEF, HLD, HTN, depression and multiple other medical issues who presented with weakness and lethargy.  She's had poor PO intake.  Of note, she fractured her ankle in October 2022 and [facility].  She was admitted with dehydration, hypotension, and AKI on 12/12 and returned on 12/28 with similar issues.  She was seen by GI on 12/30 for upper endoscopy which showed [name] nonbleeding duodenal ulcer, erythematous mucosa in the gastric body and erythematous duodenopathy.  Labs for H pylori pending and celiac labs are negative.  GI was consulted.  Heme consulted  due to persistently elevated INR.  Palliative was consulted as well.  She remains anasarcic with poor appetite.  Nephrology and cardiology saw her for  her anasarca.  She continued to decline during her admission with waxing and waning mental status as well as poor PO intake.  Decision was made ultimately for comfort measures.  See below and prior notes for additional details   Hospital Course:  * Comfort measures only status Appreciate palliative cares assistance with this difficult case.  [facility] being transitioned to comfort measures today after discussion with palliative (Mr. Kopka notes he'd discussed decision with daughters).  Acute metabolic encephalopathy Comfort measures as noted above With elevated ammonia, concern for hepatic encephalopathy [place] ascites search -> small to moderate volume ascites  TSH of 8.6 with normal free T4. Vitamin B12 and folate are within normal limits.  High dose thiamine Cortisol normal with [name] normal ACTH stim test.  Psychiatry evaluated and recommended transition from Lexapro to Remeron.  MRI brain without acute process. Chronic ischemic changes noted. Discussed with neurology and findings do not explain patient presentation; dural thickening acknowledged but recommendation for no further workup per neurology unless neurologic symptoms arose.  Fatty liver disease, nonalcoholic Imaging showed fatty liver -> her constellation of findings make me concerned/suspicious regarding her synthetic liver function and presence of cirrhosis?  This may be most likely unifying diagnosis, though this is not definitively diagnosed. High INR despite vit K, thrombocytopenia, hypoalbuminemia, mildly elevated bilirubin Appreciate GI recommendations   Lack of appetite Unclear cause. Dietitian consulted. Psychiatry consulted. Patient started on Remeron to help with appetite. Per patient, she has no decrease in appetite but has particular eating habits. Patient declines enteral nutrition via feeding  tube. GI consulted and patient underwent upper endoscopy with erythematous duodenopathy with associated non-bleeding  duodenal ulcer with clean base. -Continue Remeron - She's continuing to decline tube feeding.  Anasarca- (present on admission) Now comfort measures Chronic. Likely related to chronic hypoalbuminemia.  Foley catheter placed on 07/11/2021 and removed on 07/19/2021. Echo with normal EF, grade I diastolic dysfunction Urine without notable proteinuria (UP/C 0.17) With hypotension now as well Follow urine sodium as above Nephrology c/s, appreciate recs - holding diuresis, midodrine, appreciate assistance Cardiology c/s - noted edema not cardiac, recommended enteral feeding or TPN   Hypotension- (present on admission) Now comfort measures Improved with albumin and midodrine She remains diffusely edematous with hypoalbuminemia, though suspect she's intravascularly dry with Una <10 2 units pRBC Continue midodrine Follow BP closely Follow urine sodium (low)  AKI (acute kidney injury) ([place]) Creatinine fluctuating Appreciate renals assistance in setting of hypotension, anasarca Low urine sodium Diuresis on hold Renal US without hydro  Anemia- (present on admission) Hb improved S/p 2 units No bleeding noted, follow  PUD (peptic ulcer disease) EGD with erythematous mucosa in gastric body, erythematous duodenopathy, nonbleeding duodenal ulcer with clean base. H. Pylori and celiac serology negative (negative TTG, reticulin ab, and gliadin ab). GI recommending Protonix 40 mg BID x1 month and sucralfate suspension 1 g PO QID x2 weeks.  -Re-order h pylori stool ab (negative)  Elevated INR Unclear etiology -> liver failure, cirrhosis?. Hematology consulted and have recommended IV vitamin K. Treatment completed. Follow INR - remains elevated  Chronic kidney disease with active medical management without dialysis, stage 3 (moderate) ([place])- (present on admission) Baseline creatinine of about 1.2  Thrombocytopenia ([place])- (present on admission) Chronic and stable. Likely related to underlying  liver disease.  Hyperkalemia Hemolysis, improved  Ankle fracture- (present on admission) Left ankle. History of ORIF on recent admission on 05/07/21. [facility] of 75 lbs in boot for one month.  Hypoalbuminemia- (present on admission) Possibly secondary to poor nutrition vs liver disease. Stable.  Pressure ulcer Pressure Injury 06/25/21 Coccyx Mid;Lower Stage 3 -  Full thickness tissue loss. Subcutaneous fat may be visible but bone, tendon or muscle are NOT exposed. 1.5 cm by 1 cm (Active)  06/25/21 0130  Location: Coccyx  Location Orientation: Mid;Lower  Staging: Stage 3 -  Full thickness tissue loss. Subcutaneous fat may be visible but bone, tendon or muscle are NOT exposed.  Wound Description (Comments): 1.5 cm by 1 cm  Present on Admission: Yes     Pressure Injury Pretibial Distal;Left Unstageable - Full thickness tissue loss in which the base of the injury is covered by slough (yellow, tan, gray, green or brown) and/or eschar (tan, brown or black) in the wound bed. 2 cm x 1 cm (Active)     Location: Pretibial  Location Orientation: Distal;Left  Staging: Unstageable - Full thickness tissue loss in which the base of the injury is covered by slough (yellow, tan, gray, green or brown) and/or eschar (tan, brown or black) in the wound bed.  Wound Description (Comments): 2 cm x 1 cm  Present on Admission: Yes     Pressure Injury 07/10/21 Groin Medial Stage 1 -  Intact skin with non-blanchable redness of Allix Blomquist localized area usually over Cornell Bourbon bony prominence. medical equipment PI: lines of nonblanchable redness where purewick tubing might have been (Active)  07/10/21 1906  Location: Groin  Location Orientation: Medial  Staging: Stage 1 -  Intact skin with non-blanchable redness of Margrete Delude localized area usually over Chee Kinslow  bony prominence.  Wound Description (Comments): medical equipment PI: lines of nonblanchable redness where purewick tubing might have been  Present on Admission: Yes    Appreciate wound care recs - I think some of this may have been related to initial foley placement given appearance, will continue to monitor closely     Contamination of blood culture 1 of 2 from 12/28 with staph epidermidis. Likely contaminant.  Morbid obesity ([place])- (present on admission) Body mass index is 44.25 kg/m.  Dietitian recommendations: 48 hour Calorie Count (07/19/2021) Ensure Enlive po BID, each supplement provides 350 kcal and 20 grams of protein Prosource Plus PO BID, each provides 100 kcals and 15g protein Magic cup TID with meals, each supplement provides 290 kcal and 9 grams of protein  Beneprotein powder TID, each provides 25 kcals and 6g protein. Encouraged PO intakes and supplements  Hypothyroidism- (present on admission) Synthroid recently increased from 112 mcg to 125 mcg (on 12/19 dc summary noted). TSH slightly improved this admission. -Continue Synthroid -Repeat TSH in 2-3 weeks; increase Synthroid if TSH still elevated    Procedures: UPPER GI ENDOSCOPY (07/12/2021) Impression:               - Normal esophagus.                           - Erythematous mucosa in the gastric body.                           - Erythematous duodenopathy.                           - Non-bleeding duodenal ulcer with Dazani Norby clean ulcer                            base (Forrest Class III).                           - No specimens collected.   Recommendation:           - Resume regular diet.                           - Use Protonix (pantoprazole) 40 mg PO BID for 1                            month.                           - Use sucralfate suspension 1 gram PO QID for 2                            weeks.                           - Stool for H pylori Antigen and celiac serology.   Consultations: GI Palliative IR Nephrology Oncology Cardiology Psychiatry  Discharge Exam: Vitals:   07/28/21 2110 07/29/21 0449  BP: 94/62 102/63  Pulse: 90 92  Resp: 18 16  Temp: 98.7  F (37.1 C) 99.2 F (37.3 C)  SpO2: 99% 97%   No complaints [facility] little  more awake this morning Husband at bedside  General: No acute distress. Lungs: unlabored Neurological: Alert, but spacy look, able to answer some questions - discussed we'll get her to hospice for comfort Skin: Warm and dry. No rashes or lesions. Extremities: anasarca  Discharge Instructions   Discharge Instructions     Diet - low sodium heart healthy   Complete by: As directed    Discharge instructions   Complete by: As directed    We're sending you to [facility] for comfort care.   Discharge wound care:   Complete by: As directed    Use Dashea Mcmullan size appropriate foam dressing over the areas on the coccyx. The foam dressing can be in place up to 3 days before changing.  Apply iodine from the swabsticks or swab pads from clean utility to left medial ankle and any discolored area on the feet.  Allow to air dry.      Allergies as of 07/30/2021   No Known Allergies      Medication List     STOP taking these medications    (feeding supplement) PROSource Plus liquid   ascorbic acid 500 MG tablet Commonly known as: VITAMIN C   bisacodyl 5 MG EC tablet Generic drug: bisacodyl   escitalopram 5 MG tablet Commonly known as: LEXAPRO   feeding supplement Liqd   ferrous sulfate 325 (65 FE) MG tablet   fludrocortisone 0.1 MG tablet Commonly known as: FLORINEF   folic acid 1 MG tablet Commonly known as: FOLVITE   Gerhardt's butt cream Crea   HYDROcodone-acetaminophen 5-325 MG tablet Commonly known as: NORCO/VICODIN   lactobacillus acidophilus & bulgar chewable tablet   levothyroxine 125 MCG tablet Commonly known as: Euthyrox   mirtazapine 15 MG tablet Commonly known as: REMERON   nutrition supplement (JUVEN) Pack   ondansetron 4 MG tablet Commonly known as: ZOFRAN   Vitamin D3 125 MCG (5000 UT) Tabs   zinc sulfate 220 (50 Zn) MG capsule               Discharge Care  Instructions  (From admission, onward)           Start     Ordered   07/30/21 0000  Discharge wound care:       Comments: Use Ezabella Teska size appropriate foam dressing over the areas on the coccyx. The foam dressing can be in place up to 3 days before changing.  Apply iodine from the swabsticks or swab pads from clean utility to left medial ankle and any discolored area on the feet.  Allow to air dry.   07/30/21 1609           No Known Allergies    The results of significant diagnostics from this hospitalization (including imaging, microbiology, ancillary and laboratory) are listed below for reference.    Significant Diagnostic Studies: MR BRAIN WO CONTRAST  Result Date: 07/19/2021 CLINICAL DATA:  Mental status change. EXAM: MRI HEAD WITHOUT CONTRAST TECHNIQUE: Multiplanar, multiecho pulse sequences of the brain and surrounding structures were obtained without intravenous contrast. COMPARISON:  None. FINDINGS: Brain: There is no evidence of an acute infarct, intracranial hemorrhage, midline shift, or extra-axial fluid collection. Mild cerebral atrophy is within normal limits for age. T2 hyperintensities in the cerebral white matter bilaterally are nonspecific but compatible with mild chronic small vessel ischemic disease. There is mild-to-moderate smooth diffuse dural thickening over both cerebral convexities. No mass is identified on this unenhanced study. Vascular: Major intracranial vascular flow voids are preserved.  Skull and upper cervical spine: Diffusely diminished bone marrow T1 signal intensity throughout the skull and included upper cervical spine. Sinuses/Orbits: Bilateral cataract extraction. Extensive circumferential mucosal thickening in the sphenoid sinuses. Mild scattered mucosal thickening elsewhere in the paranasal sinuses. Moderate bilateral mastoid effusions. Other: None. IMPRESSION: 1. No acute infarct. 2. Mild chronic small vessel ischemic disease. 3. Smooth diffuse dural  thickening over both cerebral convexities, nonspecific but can be seen with remote subdural hemorrhage, intracranial hypotension, infection, inflammatory/infiltrative dural processes, and neoplasm. 4. Diffusely abnormal bone marrow signal, nonspecific but likely related to the patient's known anemia. Infiltrative/myelofibrotic marrow processes and metastatic disease can also give this appearance. Electronically Signed   By: Logan Bores M.D.   On: 07/19/2021 09:20   US RENAL  Result Date: 07/23/2021 CLINICAL DATA:  Renal dysfunction EXAM: RENAL / URINARY TRACT ULTRASOUND COMPLETE COMPARISON:  CT done on 07/11/2021 FINDINGS: Right Kidney: Renal measurements: 9.4 x 5.1 x 4.8 cm = volume: 120 mL. Echogenicity within normal limits. No mass or hydronephrosis visualized. Left Kidney: Renal measurements: 9.7 x 6 x 4.5 cm = volume: 137.7 mL. Echogenicity within normal limits. No mass or hydronephrosis visualized. Bladder: Appears normal for degree of bladder distention. Other: Small ascites is present. IMPRESSION: There is no hydronephrosis. Electronically Signed   By: Elmer Picker M.D.   On: 07/23/2021 16:52   CT CHEST ABDOMEN PELVIS W CONTRAST  Result Date: 07/11/2021 CLINICAL DATA:  Unintended weight loss EXAM: CT CHEST, ABDOMEN, AND PELVIS WITH CONTRAST TECHNIQUE: Multidetector CT imaging of the chest, abdomen and pelvis was performed following the standard protocol during bolus administration of intravenous contrast. CONTRAST:  95mL OMNIPAQUE IOHEXOL 350 MG/ML SOLN COMPARISON:  CT chest 08/10/2020.  CT abdomen and pelvis 06/26/2021 FINDINGS: CT CHEST FINDINGS Cardiovascular: Mild cardiac enlargement. Small pericardial effusion. Coronary artery and aortic calcifications. No aortic aneurysm. Mediastinum/Nodes: Esophagus is decompressed. Mediastinal lymph nodes are not pathologically enlarged. Thyroid gland is unremarkable. Lungs/Pleura: Small bilateral pleural effusions with basilar atelectasis or  consolidation. Nodule in the right middle lung measuring 5 mm diameter. No change since prior study. No pneumothorax. Musculoskeletal: Degenerative changes in the spine. No destructive bone lesions. CT ABDOMEN PELVIS FINDINGS Hepatobiliary: Mild diffuse fatty infiltration of the liver. Cholelithiasis with small stones layering in the gallbladder. Additional noncalcified nodular filling defect in the body of the gallbladder measuring about 2.6 cm diameter. This could be Yuriana Gaal noncalcified stone, sludge, or polypoid mass. Ultrasound correlation is suggested. No bile duct dilatation. Pancreas: Fatty atrophy of the pancreas. Spleen: Normal in size without focal abnormality. Adrenals/Urinary Tract: No adrenal gland nodules. Bilateral renal parenchymal atrophy and scarring. No solid mass or hydronephrosis. Bladder is decompressed with Disney Ruggiero Foley catheter. Stomach/Bowel: Stomach, small bowel, and colon are not abnormally distended. Scattered colonic diverticula. No evidence of diverticulitis. Appendix is normal. Vascular/Lymphatic: Scattered aortic calcification. No significant lymphadenopathy. Reproductive: Uterine calcifications, likely fibroids. 2.3 cm diameter structure in the right ovary containing fat and calcification, likely Davianna Deutschman small dermoid cyst. No change. Other: No free air or free fluid in the abdomen. Edema in the subcutaneous fat of the flank regions. Fatty atrophy of the abdominal wall musculature. Musculoskeletal: Degenerative changes in the lumbar spine. Lumbar scoliosis convex towards the right. No destructive bone lesions. IMPRESSION: 1. Cardiac enlargement with small pericardial effusion. 2. Small bilateral pleural effusions with basilar atelectasis or consolidation. 3. 5 mm right solid pulmonary nodule. No routine follow-up imaging is recommended per Fleischner Society Guidelines. These guidelines do not apply to immunocompromised patients and patients  with cancer. Follow up in patients with significant  comorbidities as clinically warranted. For lung cancer screening, adhere to Lung-RADS guidelines. Reference: Radiology. 2017; 284(1):228-43. 4. Cholelithiasis. 5. Noncalcified structure in the gallbladder is nonspecific and could represent stone, sludge, or mass lesion. Ultrasound correlation suggested. 6. Fatty infiltration of the liver. 7. Aortic atherosclerosis. 8. Foley catheter decompresses the bladder. 9. Calcified uterine fibroids. 10. 2.3 cm right ovarian structure containing fat and calcification, likely Del Wiseman small dermoid cyst. No change. Electronically Signed   By: Lucienne Capers M.D.   On: 07/11/2021 15:19   DG Chest Port 1 View  Result Date: 07/10/2021 CLINICAL DATA:  76 year old female with possible sepsis. EXAM: PORTABLE CHEST 1 VIEW COMPARISON:  Chest x-ray 06/29/2021. FINDINGS: Opacity at the left base obscuring the left hemidiaphragm with blunting of the left costophrenic sulcus. Subtle blunting of the right costophrenic sulcus. No pneumothorax. No evidence of pulmonary edema. Heart size is borderline to mildly enlarged. The patient is rotated to the left on today's exam, resulting in distortion of the mediastinal contours and reduced diagnostic sensitivity and specificity for mediastinal pathology. IMPRESSION: 1. Atelectasis and/or consolidation in the left lower lobe with small left pleural effusion. 2. Trace right pleural effusion. 3. Borderline cardiomegaly. Electronically Signed   By: Vinnie Langton M.D.   On: 07/10/2021 05:49   ECHOCARDIOGRAM COMPLETE  Result Date: 07/12/2021    ECHOCARDIOGRAM REPORT   Patient Name:   ANGELINAH NICKSON [facility] Date of Exam: 07/12/2021 Medical Rec #:  [id number]        Height:       68.0 in Accession #:    [id number]       Weight:       299.0 lb Date of Birth:  [birth year]         BSA:          2.425 m Patient Age:    16 years         BP:           105/51 mmHg Patient Gender: F                HR:           81 bpm. Exam Location:  Inpatient Procedure: 2D Echo,  Cardiac Doppler and Color Doppler Indications:    Edema  History:        Patient has prior history of Echocardiogram examinations. CHF.  Sonographer:    Jyl Heinz Referring Phys: Ong  1. Left ventricular ejection fraction, by estimation, is 60 to 65%. The left ventricle has normal function. The left ventricle has no regional wall motion abnormalities. Left ventricular diastolic parameters are consistent with Grade I diastolic dysfunction (impaired relaxation).  2. Right ventricular systolic function is normal. The right ventricular size is normal. Tricuspid regurgitation signal is inadequate for assessing PA pressure.  3. Taron Conrey small pericardial effusion is present. The pericardial effusion is circumferential.  4. The mitral valve is grossly normal. No evidence of mitral valve regurgitation. No evidence of mitral stenosis.  5. The aortic valve is tricuspid. Aortic valve regurgitation is not visualized. No aortic stenosis is present.  6. The inferior vena cava is normal in size with greater than 50% respiratory variability, suggesting right atrial pressure of 3 mmHg. FINDINGS  Left Ventricle: Left ventricular ejection fraction, by estimation, is 60 to 65%. The left ventricle has normal function. The left ventricle has no regional wall motion abnormalities. The left ventricular internal cavity size was normal in  size. There is  no left ventricular hypertrophy. Left ventricular diastolic parameters are consistent with Grade I diastolic dysfunction (impaired relaxation). Right Ventricle: The right ventricular size is normal. No increase in right ventricular wall thickness. Right ventricular systolic function is normal. Tricuspid regurgitation signal is inadequate for assessing PA pressure. Left Atrium: Left atrial size was normal in size. Right Atrium: Right atrial size was normal in size. Pericardium: Aithana Kushner small pericardial effusion is present. The pericardial effusion is circumferential. Presence  of epicardial fat layer. Mitral Valve: The mitral valve is grossly normal. No evidence of mitral valve regurgitation. No evidence of mitral valve stenosis. Tricuspid Valve: The tricuspid valve is grossly normal. Tricuspid valve regurgitation is trivial. No evidence of tricuspid stenosis. Aortic Valve: The aortic valve is tricuspid. Aortic valve regurgitation is not visualized. No aortic stenosis is present. Aortic valve peak gradient measures 7.2 mmHg. Pulmonic Valve: The pulmonic valve was grossly normal. Pulmonic valve regurgitation is not visualized. No evidence of pulmonic stenosis. Aorta: The aortic root and ascending aorta are structurally normal, with no evidence of dilitation. Venous: The inferior vena cava is normal in size with greater than 50% respiratory variability, suggesting right atrial pressure of 3 mmHg. IAS/Shunts: The atrial septum is grossly normal. Additional Comments: There is Sehaj Mcenroe small pleural effusion in the left lateral region.  LEFT VENTRICLE PLAX 2D LVIDd:         4.10 cm     Diastology LVIDs:         2.80 cm     LV e' medial:    4.68 cm/s LV PW:         1.00 cm     LV E/e' medial:  13.3 LV IVS:        1.10 cm     LV e' lateral:   5.98 cm/s LVOT diam:     2.20 cm     LV E/e' lateral: 10.4 LV SV:         76 LV SV Index:   31 LVOT Area:     3.80 cm  LV Volumes (MOD) LV vol d, MOD A2C: 84.4 ml LV vol d, MOD A4C: 86.7 ml LV vol s, MOD A2C: 35.0 ml LV vol s, MOD A4C: 32.9 ml LV SV MOD A2C:     49.4 ml LV SV MOD A4C:     86.7 ml LV SV MOD BP:      52.6 ml RIGHT VENTRICLE            IVC RV Basal diam:  3.30 cm    IVC diam: 1.70 cm RV Mid diam:    2.80 cm RV S prime:     8.49 cm/s TAPSE (M-mode): 2.1 cm LEFT ATRIUM             Index        RIGHT ATRIUM           Index LA diam:        2.40 cm 0.99 cm/m   RA Area:     19.60 cm LA Vol (A2C):   24.7 ml 10.19 ml/m  RA Volume:   56.30 ml  23.22 ml/m LA Vol (A4C):   42.0 ml 17.32 ml/m LA Biplane Vol: 35.6 ml 14.68 ml/m  AORTIC VALVE AV Area (Vmax):  2.79 cm AV Vmax:        134.00 cm/s AV Peak Grad:   7.2 mmHg LVOT Vmax:      98.30 cm/s LVOT Vmean:  75.800 cm/s LVOT VTI:       0.199 m  AORTA Ao Root diam: 3.10 cm Ao Asc diam:  3.20 cm MITRAL VALVE MV Area (PHT): 5.84 cm    SHUNTS MV Decel Time: 130 msec    Systemic VTI:  0.20 m MV E velocity: 62.40 cm/s  Systemic Diam: 2.20 cm MV Baird Polinski velocity: 88.60 cm/s MV E/Ebba Goll ratio:  0.70 Eleonore Chiquito MD Electronically signed by Eleonore Chiquito MD Signature Date/Time: 07/12/2021/3:35:58 PM    Final    [place] ASCITES (ABDOMEN LIMITED)  Result Date: 07/27/2021 CLINICAL DATA:  Ascites EXAM: LIMITED ABDOMEN ULTRASOUND FOR ASCITES TECHNIQUE: Limited ultrasound survey for ascites was performed in all four abdominal quadrants. COMPARISON:  CT 06/26/2021 FINDINGS: Small-moderate volume ascites is seen within all 4 abdominal quadrants. Largest pocket appears to be within the left lower quadrant. IMPRESSION: Small-moderate volume ascites. Electronically Signed   By: Davina Poke D.O.   On: 07/27/2021 17:57   [place] EKG SITE RITE  Result Date: 07/26/2021 If Site Rite image not attached, placement could not be confirmed due to current cardiac rhythm.   Microbiology: No results found for this or any previous visit (from the past 240 hour(s)).   Labs: Basic Metabolic Panel: Recent Labs  Lab 07/25/21 0423 07/26/21 0413 07/27/21 0527 07/28/21 0431 07/29/21 0412  NA 138 139 143 145 145  K 4.3 4.0 3.6 4.2 4.2  CL 110 109 114* 116* 116*  CO2 19* 21* 22 21* 20*  GLUCOSE 82 77 85 108* 95  BUN 90* 84* 83* 79* 77*  CREATININE 1.67* 1.59* 1.37* 1.47* 1.75*  CALCIUM 7.6* 7.8* 8.1* 8.3* 8.2*  MG 2.4 2.6* 2.5* 2.5* 2.5*  PHOS 4.7* 4.6 4.1 3.2 4.0   Liver Function Tests: Recent Labs  Lab 07/25/21 0423 07/26/21 0413 07/27/21 0527 07/28/21 0431 07/29/21 0412  AST 19 19 19 23 24   ALT 22 21 22 24 24   ALKPHOS 318* 279* 259* 283* 271*  BILITOT 1.4* 1.4* 1.7* 1.7* 1.5*  PROT 4.9* 4.5* 4.9* 5.3* 5.2*  ALBUMIN 1.9* 2.1*  2.8* 2.9* 2.6*   No results for input(s): LIPASE, AMYLASE in the last 168 hours. Recent Labs  Lab 07/25/21 0423 07/26/21 0413 07/27/21 1305 07/28/21 0431 07/29/21 0412  AMMONIA 85* 73* 152* 136* 145*   CBC: Recent Labs  Lab 07/25/21 0423 07/26/21 0413 07/26/21 2110 07/27/21 0527 07/28/21 0431 07/29/21 0412  WBC 8.9 6.8  --  6.9 9.2 10.4  NEUTROABS 3.5 2.8  --  2.5 3.6 4.2  HGB 7.3* 6.3* 7.3* 7.0* 9.4* 9.1*  HCT 24.1* 20.4* 23.4* 22.9* 29.2* 28.7*  MCV 104.8* 100.0  --  100.0 95.7 97.3  PLT 82* 61*  --  57* 59* 63*   Cardiac Enzymes: No results for input(s): CKTOTAL, CKMB, CKMBINDEX, TROPONINI in the last 168 hours. BNP: BNP (last 3 results) No results for input(s): BNP in the last 8760 hours.  ProBNP (last 3 results) No results for input(s): PROBNP in the last 8760 hours.  CBG: No results for input(s): GLUCAP in the last 168 hours.     Signed:  Fayrene Helper MD.  [facility] 07/30/2021, 4:13 PM

## 2021-07-30 NOTE — TOC Progression Note (Addendum)
Transition of Care [facility]) - Progression Note    Patient Details  Name: Sally Wise MRN: [id number] Date of Birth: [birth year]  Transition of [facility] CM/SW Contact  Lennart Pall, LCSW Phone Number: 07/30/2021, 11:21 AM  Clinical Narrative:    Met with pt's spouse to discuss recommendations for residential Hospice.  He is in full agreement and chooses [facility].  Referral placed.  ADDENDUM: Bed ready at [facility] today. PTAR called at 4:45pm.  RN to call report to [phone].  No further TOC needs.  Expected Discharge Plan: [place] Barriers to Discharge: Continued Medical Work up  Expected Discharge Plan and Services Expected Discharge Plan: [place]   Discharge Planning Services: CM Consult Post Acute Care Choice: [place] Living arrangements for the past 2 months: [place]                                       Social Determinants of Health (SDOH) Interventions    Readmission Risk Interventions Readmission Risk Prevention Plan 07/12/2021 06/08/2020  Post Dischage Appt - Complete  Medication Screening - Complete  Transportation Screening Complete Complete  PCP or Specialist Appt within 3-5 Days Complete -  HRI or Home Care Consult Complete -  Social Work Consult for [place] Planning/Counseling Complete -  Palliative Care Screening Not Applicable -  Medication Review [employer]) Complete -  Some recent data might be hidden

## 2021-07-30 NOTE — Progress Notes (Signed)
Patient being transferred to [facility], report given to receiving nurse

## 2021-07-30 NOTE — Progress Notes (Signed)
Nephrology Update Note  The patient's care has transitioned to aggressive focus on maintaining comfort. Therefore, we will sign off at this time. If further help is needed please let us know.

## 2021-07-30 NOTE — Progress Notes (Addendum)
[facility]) Hospital Liaison Note  Received request from Transitions of Care Manager Valentina Gu for family interest in [facility]. Visited patient at bedside and spoke with spouse/James to confirm interest and explain services.  Approval for [facility] is determined by [facility] MD. Once [facility] MD has determined [facility] eligibility, [facility] will update hospital staff and family.  Addendum: Patient has been approved for [facility]. Family advised that Lactulose will not be continued at [facility].  Unfortunately, [facility] is not able to offer a room today. Spouse/James and TOC Manager aware hospital liaison will follow up tomorrow or sooner if a room becomes available  Addendum: 4:15p [facility] able to offer a room today and family accepted offer.   Consent forms to be completed by Fayrene Fearing.  PTAR to be notified of patient D/C and transport to be arranged. TOC/ucy and Attending Physician/ Dr. Lowell Guitar notified of transport arrangement.    Please send signed DNR form with patient and RN call report to [phone].   Please do not hesitate to call with any hospice related questions.    Thank you for the opportunity to participate in this patient's care.  Odette Fraction, MSW [facility] Liaison  [phone]

## 2021-08-06 ENCOUNTER — Ambulatory Visit: Payer: Medicare Other | Admitting: Neurology

## 2021-08-14 DEATH — deceased

## 2022-05-23 IMAGING — MR MR THORACIC SPINE W/O CM
4 of 7 series · 15 of 48 positions shown · non-contrast
Comparison: None.

CLINICAL DATA: Right-sided leg weakness

EXAM:
MRI THORACIC SPINE WITHOUT CONTRAST
TECHNIQUE: Multiplanar, multisequence MR imaging of the thoracic spine was
performed. No intravenous contrast was administered.

[Series 4: T2 · sagittal · 3.0mm · 0.55mm/px · 5 of 18 slices shown (1 of 2)]
[im 1/18]
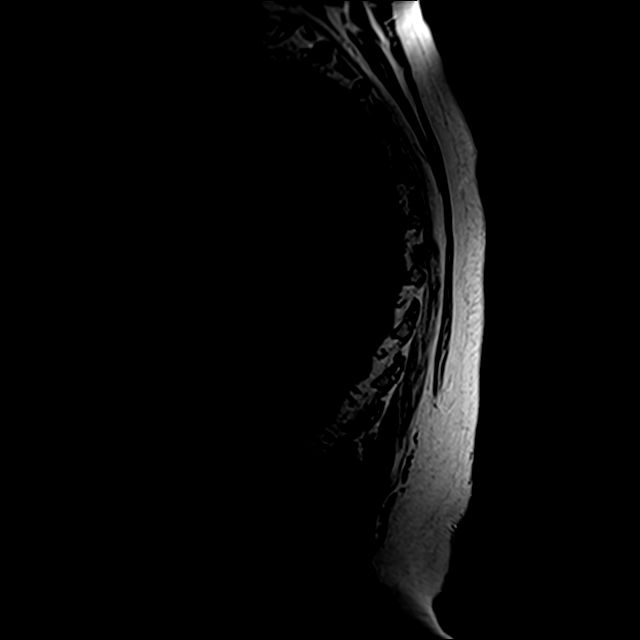
[im 5/18]
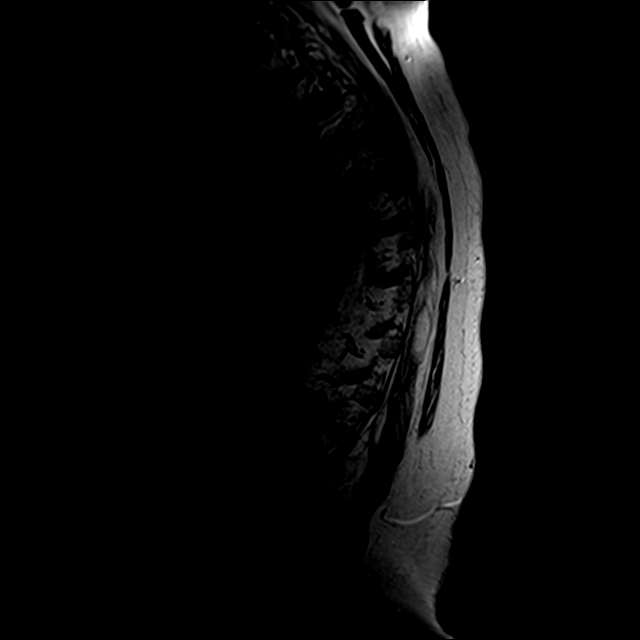
[im 9/18]
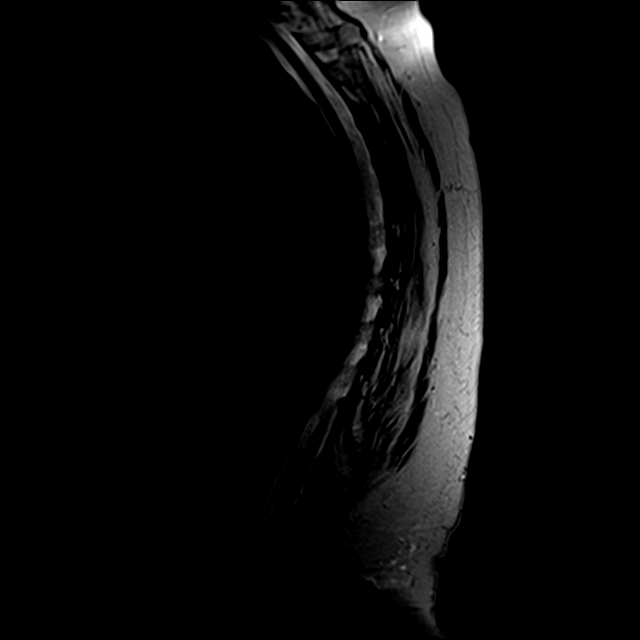
[im 13/18]
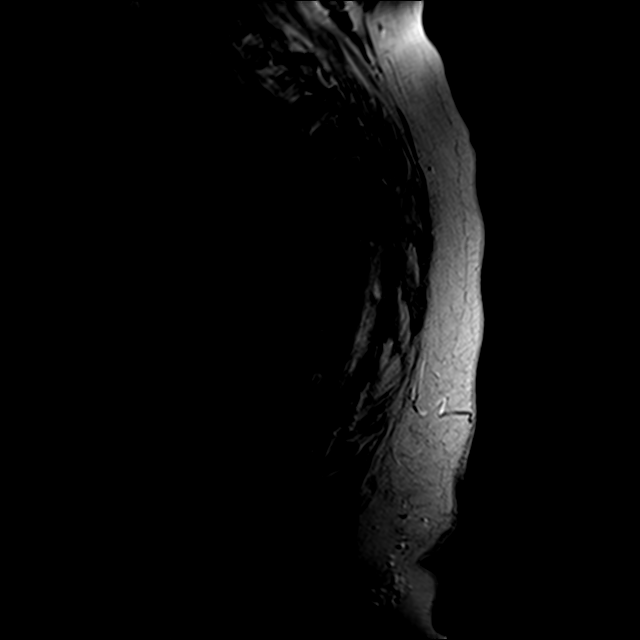
[im 18/18]
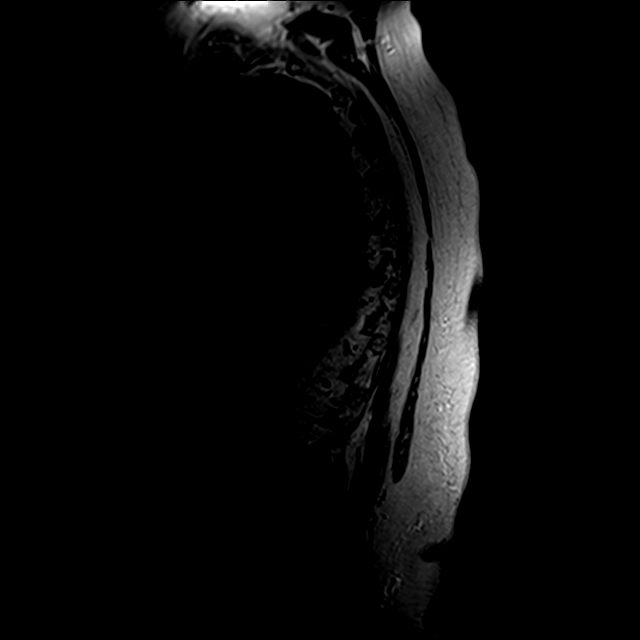

[Series 5: T1 · sagittal · 3.0mm · 0.55mm/px · 3 of 18 slices shown (1 of 2)]
[im 1/18]
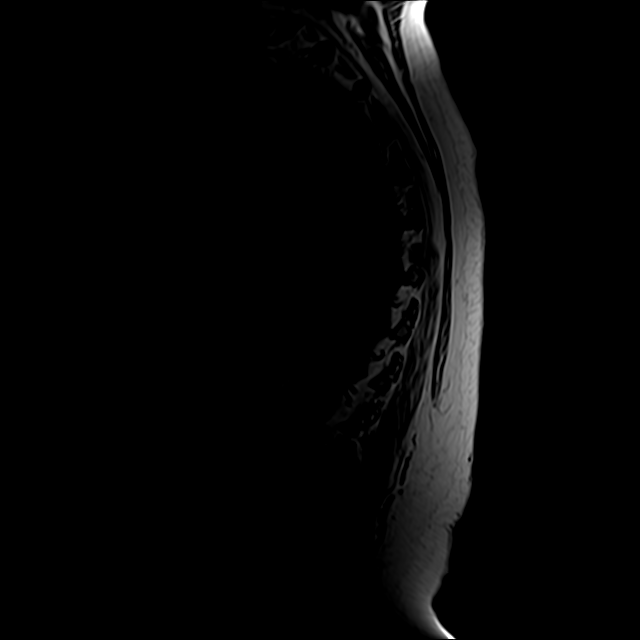
[im 9/18]
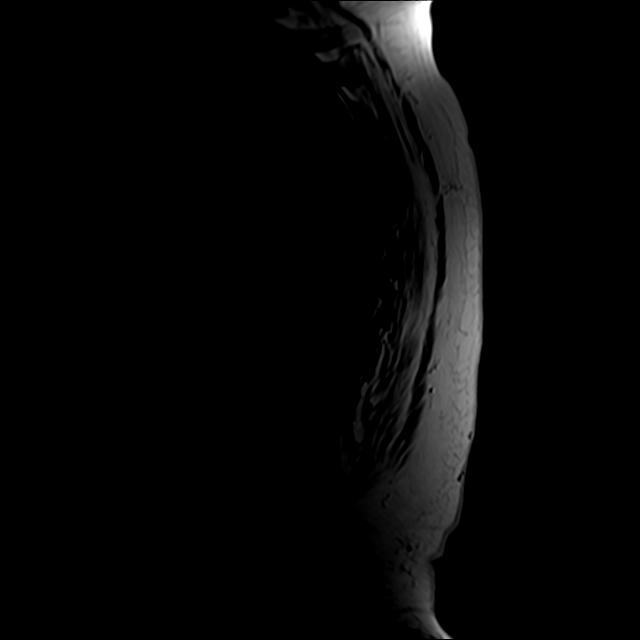
[im 18/18]
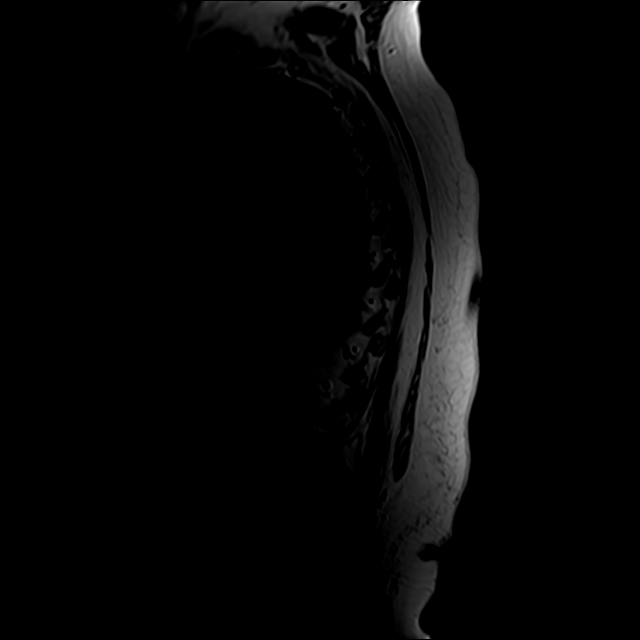

[Series 7: T2 · axial · 4.0mm · 0.39mm/px · z∈[-212,-22]mm · 4 of 38 slices shown (2 of 2)]
[im 1/38]
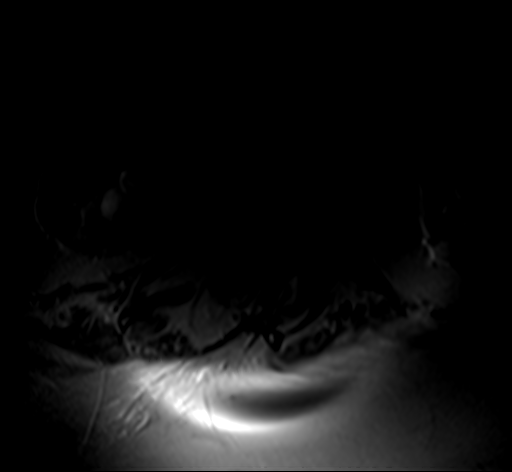
[im 4/38]
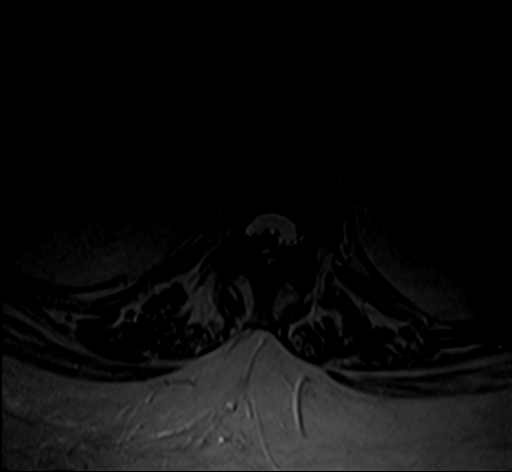
[im 19/38]
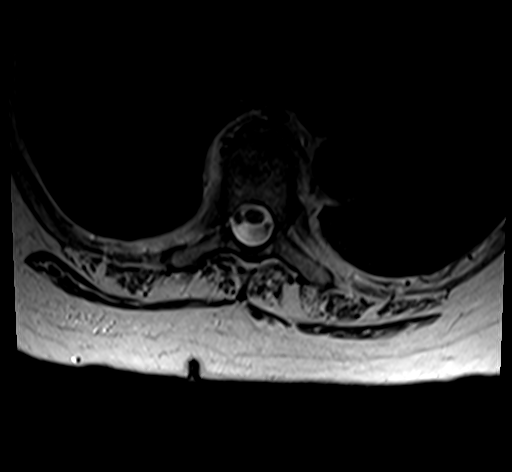
[im 34/38]
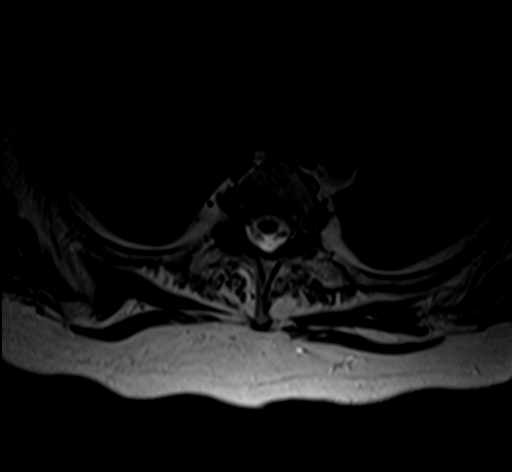

[Series 10: T1 · sagittal · 3.0mm · 0.55mm/px · 3 of 19 slices shown (2 of 2)]
[im 4/19]
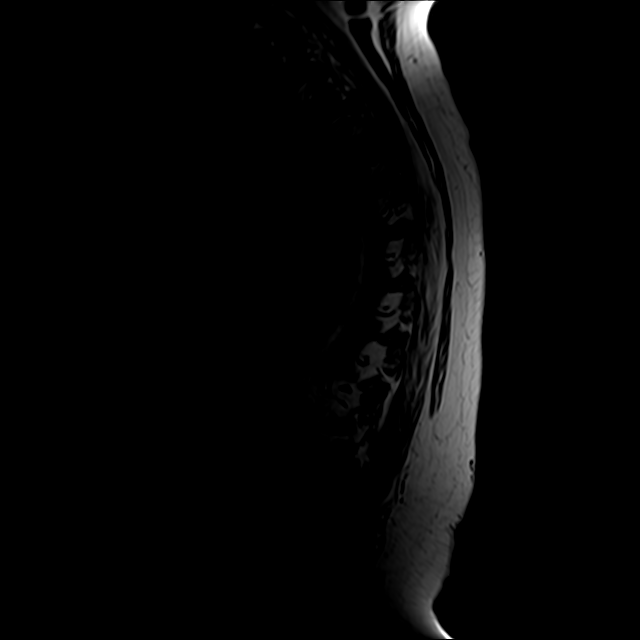
[im 11/19]
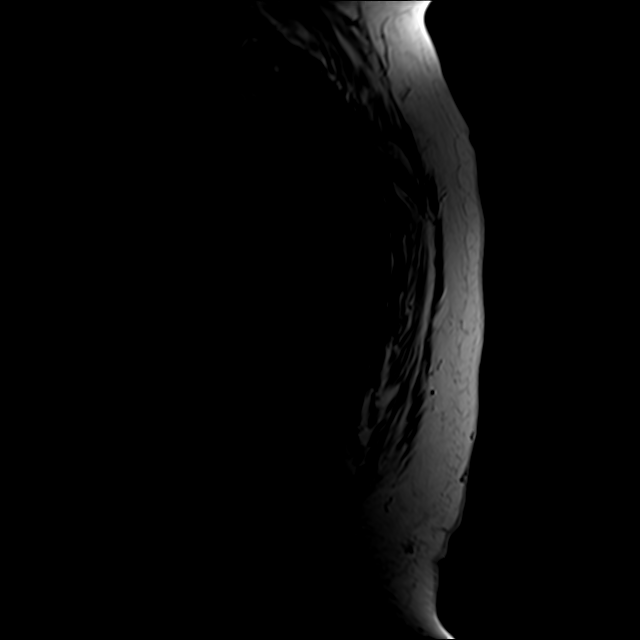
[im 19/19]
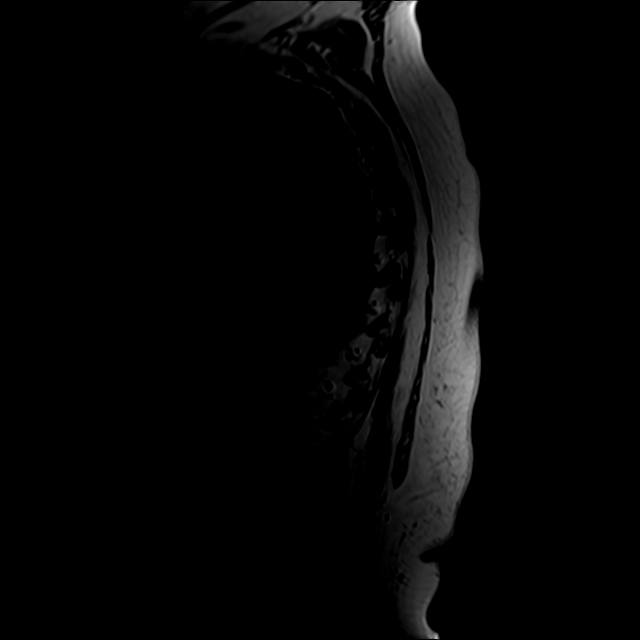

[15 of 48 positions shown; findings below may reference images not displayed]

FINDINGS: Alignment: Accentuation of thoracic kyphosis. No significant
anteroposterior listhesis.

Vertebrae: Minor degenerative endplate marrow changes. For example,
minimal edema at T6-T7 opposing endplates. No suspicious osseous
lesion. No acute compression deformity.

Cord:  No abnormal signal.

Paraspinal and other soft tissues: Unremarkable.

Disc levels:

There is mild multilevel degenerative disc disease with loss of disc
height and small disc bulges or protrusions. For example, small
central disc protrusion at T7-T8. Mild facet arthropathy is also
present with ligamentum flavum thickening. There is no high-grade
canal or foraminal stenosis at any level.
IMPRESSION: Mild degenerative changes without significant stenosis.
